# Patient Record
Sex: Female | Born: 1960
Health system: Southern US, Community
[De-identification: ages and names within clinical notes are randomized; demographics above are authoritative.]

## PROBLEM LIST (undated history)

## (undated) DIAGNOSIS — E785 Hyperlipidemia, unspecified: Secondary | ICD-10-CM

## (undated) DIAGNOSIS — K219 Gastro-esophageal reflux disease without esophagitis: Secondary | ICD-10-CM

## (undated) DIAGNOSIS — I82409 Acute embolism and thrombosis of unspecified deep veins of unspecified lower extremity: Secondary | ICD-10-CM

## (undated) DIAGNOSIS — J9611 Chronic respiratory failure with hypoxia: Secondary | ICD-10-CM

## (undated) DIAGNOSIS — I5032 Chronic diastolic (congestive) heart failure: Secondary | ICD-10-CM

## (undated) DIAGNOSIS — T4145XA Adverse effect of unspecified anesthetic, initial encounter: Secondary | ICD-10-CM

## (undated) DIAGNOSIS — J9612 Chronic respiratory failure with hypercapnia: Secondary | ICD-10-CM

## (undated) DIAGNOSIS — Z8616 Personal history of COVID-19: Secondary | ICD-10-CM

## (undated) DIAGNOSIS — I10 Essential (primary) hypertension: Secondary | ICD-10-CM

## (undated) DIAGNOSIS — T8859XA Other complications of anesthesia, initial encounter: Secondary | ICD-10-CM

## (undated) HISTORY — PX: BREAST EXCISIONAL BIOPSY: SUR124

## (undated) HISTORY — PX: ABDOMINAL HYSTERECTOMY: SHX81

## (undated) HISTORY — DX: Personal history of COVID-19: Z86.16

---

## 1999-04-08 ENCOUNTER — Encounter (HOSPITAL_BASED_OUTPATIENT_CLINIC_OR_DEPARTMENT_OTHER): Payer: Self-pay | Admitting: General Surgery

## 1999-04-08 ENCOUNTER — Ambulatory Visit (HOSPITAL_COMMUNITY): Admission: RE | Admit: 1999-04-08 | Discharge: 1999-04-08 | Payer: Self-pay | Admitting: General Surgery

## 1999-04-21 ENCOUNTER — Ambulatory Visit (HOSPITAL_COMMUNITY): Admission: RE | Admit: 1999-04-21 | Discharge: 1999-04-21 | Payer: Self-pay | Admitting: General Surgery

## 1999-04-21 ENCOUNTER — Encounter (HOSPITAL_BASED_OUTPATIENT_CLINIC_OR_DEPARTMENT_OTHER): Payer: Self-pay | Admitting: General Surgery

## 1999-04-29 ENCOUNTER — Emergency Department (HOSPITAL_COMMUNITY): Admission: EM | Admit: 1999-04-29 | Discharge: 1999-04-29 | Payer: Self-pay | Admitting: Emergency Medicine

## 1999-07-06 ENCOUNTER — Encounter (HOSPITAL_BASED_OUTPATIENT_CLINIC_OR_DEPARTMENT_OTHER): Payer: Self-pay | Admitting: General Surgery

## 1999-07-08 ENCOUNTER — Encounter (HOSPITAL_BASED_OUTPATIENT_CLINIC_OR_DEPARTMENT_OTHER): Payer: Self-pay | Admitting: General Surgery

## 1999-07-08 ENCOUNTER — Ambulatory Visit (HOSPITAL_COMMUNITY): Admission: RE | Admit: 1999-07-08 | Discharge: 1999-07-08 | Payer: Self-pay | Admitting: General Surgery

## 1999-07-11 ENCOUNTER — Encounter (HOSPITAL_BASED_OUTPATIENT_CLINIC_OR_DEPARTMENT_OTHER): Payer: Self-pay | Admitting: General Surgery

## 2000-04-12 ENCOUNTER — Other Ambulatory Visit: Admission: RE | Admit: 2000-04-12 | Discharge: 2000-04-12 | Payer: Self-pay | Admitting: Obstetrics and Gynecology

## 2000-04-23 ENCOUNTER — Encounter: Payer: Self-pay | Admitting: Obstetrics and Gynecology

## 2000-04-23 ENCOUNTER — Ambulatory Visit (HOSPITAL_COMMUNITY): Admission: RE | Admit: 2000-04-23 | Discharge: 2000-04-23 | Payer: Self-pay | Admitting: Obstetrics and Gynecology

## 2000-06-07 ENCOUNTER — Encounter: Payer: Self-pay | Admitting: Obstetrics and Gynecology

## 2000-06-13 ENCOUNTER — Encounter (INDEPENDENT_AMBULATORY_CARE_PROVIDER_SITE_OTHER): Payer: Self-pay | Admitting: Specialist

## 2000-06-13 ENCOUNTER — Inpatient Hospital Stay (HOSPITAL_COMMUNITY): Admission: RE | Admit: 2000-06-13 | Discharge: 2000-06-15 | Payer: Self-pay | Admitting: Obstetrics and Gynecology

## 2001-03-05 ENCOUNTER — Emergency Department (HOSPITAL_COMMUNITY): Admission: EM | Admit: 2001-03-05 | Discharge: 2001-03-05 | Payer: Self-pay

## 2002-07-17 ENCOUNTER — Encounter (HOSPITAL_BASED_OUTPATIENT_CLINIC_OR_DEPARTMENT_OTHER): Payer: Self-pay | Admitting: General Surgery

## 2002-07-17 ENCOUNTER — Ambulatory Visit (HOSPITAL_COMMUNITY): Admission: RE | Admit: 2002-07-17 | Discharge: 2002-07-17 | Payer: Self-pay | Admitting: General Surgery

## 2003-01-16 ENCOUNTER — Encounter: Payer: Self-pay | Admitting: Emergency Medicine

## 2003-01-16 ENCOUNTER — Emergency Department (HOSPITAL_COMMUNITY): Admission: EM | Admit: 2003-01-16 | Discharge: 2003-01-16 | Payer: Self-pay | Admitting: Emergency Medicine

## 2003-04-07 ENCOUNTER — Encounter: Payer: Self-pay | Admitting: Emergency Medicine

## 2003-04-07 ENCOUNTER — Emergency Department (HOSPITAL_COMMUNITY): Admission: AC | Admit: 2003-04-07 | Discharge: 2003-04-07 | Payer: Self-pay

## 2003-09-03 ENCOUNTER — Ambulatory Visit (HOSPITAL_COMMUNITY): Admission: RE | Admit: 2003-09-03 | Discharge: 2003-09-03 | Payer: Self-pay | Admitting: General Surgery

## 2003-10-19 ENCOUNTER — Emergency Department (HOSPITAL_COMMUNITY): Admission: EM | Admit: 2003-10-19 | Discharge: 2003-10-19 | Payer: Self-pay | Admitting: Emergency Medicine

## 2003-10-28 ENCOUNTER — Encounter: Admission: RE | Admit: 2003-10-28 | Discharge: 2003-10-28 | Payer: Self-pay | Admitting: Family Medicine

## 2006-06-10 ENCOUNTER — Emergency Department (HOSPITAL_COMMUNITY): Admission: EM | Admit: 2006-06-10 | Discharge: 2006-06-11 | Payer: Self-pay | Admitting: Emergency Medicine

## 2006-10-08 ENCOUNTER — Ambulatory Visit (HOSPITAL_COMMUNITY): Admission: RE | Admit: 2006-10-08 | Discharge: 2006-10-08 | Payer: Self-pay | Admitting: Family Medicine

## 2007-12-14 ENCOUNTER — Emergency Department (HOSPITAL_COMMUNITY): Admission: EM | Admit: 2007-12-14 | Discharge: 2007-12-14 | Payer: Self-pay | Admitting: Emergency Medicine

## 2009-01-21 ENCOUNTER — Ambulatory Visit (HOSPITAL_COMMUNITY): Admission: RE | Admit: 2009-01-21 | Discharge: 2009-01-21 | Payer: Self-pay | Admitting: Family Medicine

## 2010-05-25 ENCOUNTER — Emergency Department (HOSPITAL_COMMUNITY): Admission: EM | Admit: 2010-05-25 | Discharge: 2010-05-25 | Payer: Self-pay | Admitting: Emergency Medicine

## 2010-09-11 ENCOUNTER — Emergency Department (HOSPITAL_COMMUNITY): Admission: EM | Admit: 2010-09-11 | Discharge: 2010-09-11 | Payer: Self-pay | Admitting: Emergency Medicine

## 2010-11-06 ENCOUNTER — Encounter: Payer: Self-pay | Admitting: Family Medicine

## 2010-12-27 LAB — DIFFERENTIAL
Basophils Absolute: 0 10*3/uL (ref 0.0–0.1)
Basophils Relative: 0 % (ref 0–1)
Eosinophils Absolute: 0 10*3/uL (ref 0.0–0.7)
Eosinophils Relative: 0 % (ref 0–5)
Lymphocytes Relative: 25 % (ref 12–46)
Lymphs Abs: 2 10*3/uL (ref 0.7–4.0)
Monocytes Absolute: 0.6 10*3/uL (ref 0.1–1.0)
Monocytes Relative: 8 % (ref 3–12)
Neutro Abs: 5.3 10*3/uL (ref 1.7–7.7)
Neutrophils Relative %: 67 % (ref 43–77)

## 2010-12-27 LAB — CBC
HCT: 40.3 % (ref 36.0–46.0)
Hemoglobin: 12.9 g/dL (ref 12.0–15.0)
MCH: 23.9 pg — ABNORMAL LOW (ref 26.0–34.0)
MCHC: 32 g/dL (ref 30.0–36.0)
MCV: 74.6 fL — ABNORMAL LOW (ref 78.0–100.0)
Platelets: 289 10*3/uL (ref 150–400)
RBC: 5.4 MIL/uL — ABNORMAL HIGH (ref 3.87–5.11)
RDW: 15.7 % — ABNORMAL HIGH (ref 11.5–15.5)
WBC: 8 10*3/uL (ref 4.0–10.5)

## 2010-12-27 LAB — SEDIMENTATION RATE: Sed Rate: 47 mm/hr — ABNORMAL HIGH (ref 0–22)

## 2011-03-03 NOTE — H&P (Signed)
[facility]  Patient:    Tamara Russell, Tamara Russell                      MRN: [id number] Adm. Date:  06/13/00 Attending:  Zenaida Niece, M.D.                         History and Physical  CHIEF COMPLAINT:  Symptomatic leiomyomatous uterus.  HISTORY OF PRESENT ILLNESS:  This is a 50 year old black female gravida 3, para 3-0-0-3 whom I saw for her annual exam in June of this year. At that time, she complained of regular menses which were fairly heavy with significant cramping. She was having no intermenstrual bleeding. She also complained of some burning after intercourse and some pelvic pressure, as well as occasional constipation. Physical exam at that time revealed her uterus to be 16 to 18 weeks size and mobile and nontender. She also had bacterial vaginosis. She was treated for the bacterial vaginosis, and a pelvic ultrasound confirms that she has multiple fibroids, some with cystic degeneration. Due to the size of this uterus, surgical therapy was recommended. She is admitted for hysterectomy at this time.  PAST OBSTETRICAL HISTORY:  Significant for two vaginal deliveries at term without complications and one cesarean section at term for a 10 pound, 7 1/2 ounce baby.  PAST SURGICAL HISTORY:  The remainder of her surgical history is significant for tubal ligation, as well as two breast biopsies.  PAST MEDICAL HISTORY:  Significant for hypertension.  ALLERGIES:  None known.  CURRENT MEDICATIONS:  She is on a blood pressure medication which she cannot quote to me in the office.  SOCIAL HISTORY:  The patient is married and denies alcohol, tobacco, or drug use.  FAMILY HISTORY:  Significant for a maternal grandmother with colon cancer.  REVIEW OF SYSTEMS:  Otherwise negative.  PHYSICAL EXAMINATION:  GENERAL:  She is an obese, black female who is in no acute distress.  VITAL SIGNS:  Weight 226 pounds, vitals are stable. Office hemoglobin  11.9.  HEENT:  Pupils are equally round and reactive to light and accommodation. Extraocular muscles are intact. Oropharynx is clear without erythema or exudates.  NECK:  Supple without lymphadenopathy or thyromegaly.  LUNGS:  Clear to auscultation.  HEART:  Regular rate and rhythm without murmurs.  BREASTS:  Exam in the sitting and supine position revealed no dominant masses, adenopathy, skin change, or nipple discharge.  ABDOMEN:  Soft, nontender, nondistended with a pelvic mass at her umbilicus and she has a well-healed transverse scar.  EXTREMITIES:  No edema. Nontender. DTRs are 2/4 and symmetric.  PELVIC:  External genitalia is without lesions. On speculum exam, she has a normal-appearing cervix and Pap smears within normal limits. On bimanual exam, she has now an 18- to 20-week size irregular uterus which is mobile and nontender.  ASSESSMENT:  Symptomatic leiomyomatous uterus with menorrhagia and dysmenorrhea. The uterus is also now approximately twice as big as it was two years ago. She also has a well-controlled hypertension. Options for therapy were discussed with the patient, including medical therapy, to await menopause, and surgical therapy including myomectomy and hysterectomy. The patient agrees to proceed with hysterectomy. We discussed ovarian conversation, and she desires ovarian removal. Risks of surgery including bleeding, infection, damage to surrounding organs have been discussed with the patient, and she agrees to proceed.  PLAN:  Admit the patient on the day of surgery for an abdominal hysterectomy with bilateral  salpingo-oophorectomy.DD:  06/12/00 TD:  06/12/00 Job: [id number]

## 2011-03-03 NOTE — Discharge Summary (Signed)
[facility]  Patient:    Tamara Russell, Tamara Russell                  MRN: [id number] Adm. Date:  [id number] Disc. Date: [id number] Attending:  Michaele Offer                           Discharge Summary  ADMISSION DIAGNOSIS:   Symptomatic leiomyomatous uterus.  DISCHARGE DIAGNOSIS:   Symptomatic leiomyomatous uterus.  PROCEDURES:  Total abdominal hysterectomy and bilateral salpingo-oophorectomy.  COMPLICATIONS:  None.  CONSULTATIONS:  None.  HISTORY & PHYSICAL:  Briefly, this is a 50 year old black female, gravida 3, para 3-0-0-3, who has regular menses which are fairly heavy with significant cramping.  She also has some dyspareunia and pelvic pressure as well as occasional constipation.  Physical exam revealed her uterus to be 16 to 18-week size consistent with le;iomyomatous uterus.  This is confirmed by pelvic ultrasound which reveals fibroids with cystic degeneration.  She was offered hysterectomy due to the size of the uterus and her symptoms and is admitted for this at this time.  PAST MEDICAL HISTORY:  Significant for two vaginal deliveries, one cesarean section at term, a tubal ligation, and two breast biopsies.  She also has hypertension which is controlled on Hyzaar and Norvasc.  CURRENT MEDICATIONS: 1. Hyzaar 100/25 one p.o. q.h.s. 2. Norvasc 10 mg p.o. q.h.s.  The remainder of her history is noncontributory.  PHYSICAL EXAMINATION:  VITAL SIGNS: Weight 226 pounds.  ABDOMEN:  Soft, nontender, nondistended, with a pelvic mass at her umbilicus and a well-healed previous transverse scar.  PELVIC:  Normal external genitalia and a normal-appearing cervix on pelvic exam.  On bimanual exam, she has an 18 to 20-week size irregular uterus which is mobile and nontender.  HOSPITAL COURSE:  The patient was admitted on the day of surgery and underwent a TAH-BSO under general anesthesia with an estimated blood loss of 150 cc. She had an  enlarged irregular uterus with multiple leiomyomas and normal ovaries and appendix.  Postoperatively, she had a low-grade temperature to 100.4, and preoperative hemoglobin was 12.4; postoperative hemoglobin was 9.3.  She was rapidly able to ambulate and tolerate a regular diet.  On the evening of postoperative day #2, her incision was well approximated. She was tolerating a diet, remained afebrile, and was felt to be stable enou9gh for discharge home.  CONDITION ON DISCHARGE:  Stable.  DISPOSITION:   Discharged to home.  DISCHARGE INSTRUCTIONS:  Her diet is regular diet.  Her activity is pelvic rest, no strenuous activity, and no driving.  Her followup is in three to four days for staple removal.  Medications are Percocet p.r.n. pain and Estradiol 1 mg p.o. q.d. DD:  06/23/00 TD:  06/25/00 Job: [id number]

## 2011-03-03 NOTE — Op Note (Signed)
[facility]  Patient:    Tamara Russell, Tamara Russell                  MRN: [id number] Proc. Date: 06/13/00 Adm. Date:  [id number] Attending:  Michaele Offer                           Operative Report  PREOPERATIVE DIAGNOSES:  Symptomatic leiomyomata uterus.  POSTOPERATIVE DIAGNOSES:  Symptomatic leiomyomata uterus.  PROCEDURE:  Total abdominal hysterectomy, bilateral salpingo-oophorectomy.  SURGEON:  Lavina Hamman, M.D.  ASSISTANT:  Huel Cote, M.D.  ANESTHESIA:  General endotracheal tube.  ESTIMATED BLOOD LOSS:  150 cc.  CHEMOPROPHYLAXIS:  Ancef 1 gm.  FINDINGS:  Enlarged irregular uterus with multiple leiomyomas with normal tubes and ovaries with evidence of previous tubal ligation and a normal appendix.  COUNTS:  Correct.  CONDITION:  Stable.  DESCRIPTION OF PROCEDURE:  After appropriate informed consent was obtained, the patient was taken to the operating room and placed in the dorsal supine position. General anesthesia was induced and her abdomen was prepped and draped in the usual sterile fashion and a Foley catheter inserted. Her abdomen was then entered via her previous Pfannenstiel incision. A self retaining retractor was placed including a bladder blade. The uterus was then delivered through the incision and found to be enlarged irregular with several leiomyomas. The round ligaments were divided with electrocautery and the bladder flap created anteriorly and the bladder pushed inferiorly.  Both infundibulopelvic ligaments were isolated, clamped, transected and ligated with #1 chromic doubly to achieve adequate hemostasis. Uterine arteries were skeletonized, clamped, transected and ligated with #1 chromic. The bladder was then pushed well inferior. The cardinal ligaments were clamped, transected and ligated with #1 chromic. The uterosacral ligaments were clamped, transected, and ligated with #1 chromic. On the patients right  side this included the vaginal angle.  Once the vagina was entered on the right side, a clamp was placed on the vaginal angle on the left and the uterus with the cervix was removed. The left vaginal angle was then secured with #1 chromic. The remainder of the vagina was able to be closed with 1 figure-of-eight suture of #1 chromic. The pelvis was irrigated and bleeding from sural sludge is controlled with electrocautery. Both ureters were identified and found to be out of the operative field. The uterosacral ligaments were plicated in the midline with 1 interrupted suture of #1 chromic. The pelvis was again irrigated and found to be hemostatic. All packs were removed from the abdomen as well as the self retaining retractor. The subfascial space was irrigated and made hemostatic with electrocautery. The fascia was closed in a running fashion starting at both ends and meeting in the middle with #0 Vicryl. The fascia on her left side was rather unusual in that it appeared that some of the rectus abdominis had grown between the layers of fascia. The subfascial space was then irrigated and made hemostatic with electrocautery. The subcutaneous tissue was closed with a running suture of 3-0 Vicryl. The skin was then closed with staples and a sterile dressing. The patient tolerated the procedure well and was extubated in the operating room and taken to the recovery room in stable condition. DD:  06/13/00 TD:  06/13/00 Job: [id number]

## 2013-07-27 ENCOUNTER — Emergency Department (HOSPITAL_COMMUNITY): Payer: BC Managed Care – PPO

## 2013-07-27 ENCOUNTER — Emergency Department (HOSPITAL_COMMUNITY)
Admission: EM | Admit: 2013-07-27 | Discharge: 2013-07-27 | Disposition: A | Discharge status: Home / Self Care | Payer: BC Managed Care – PPO | Attending: Emergency Medicine | Admitting: Emergency Medicine

## 2013-07-27 ENCOUNTER — Encounter (HOSPITAL_COMMUNITY): Payer: Self-pay | Admitting: Emergency Medicine

## 2013-07-27 DIAGNOSIS — Z880 Allergy status to penicillin: Secondary | ICD-10-CM | POA: Insufficient documentation

## 2013-07-27 DIAGNOSIS — K219 Gastro-esophageal reflux disease without esophagitis: Secondary | ICD-10-CM

## 2013-07-27 DIAGNOSIS — I10 Essential (primary) hypertension: Secondary | ICD-10-CM | POA: Insufficient documentation

## 2013-07-27 HISTORY — DX: Essential (primary) hypertension: I10

## 2013-07-27 LAB — POCT I-STAT TROPONIN I

## 2013-07-27 LAB — POCT I-STAT, CHEM 8
Calcium, Ion: 1.43 mmol/L — ABNORMAL HIGH (ref 1.12–1.23)
Chloride: 96 mEq/L (ref 96–112)
Creatinine, Ser: 1 mg/dL (ref 0.50–1.10)
Glucose, Bld: 93 mg/dL (ref 70–99)
HCT: 42 % (ref 36.0–46.0)
Hemoglobin: 14.3 g/dL (ref 12.0–15.0)
TCO2: 34 mmol/L (ref 0–100)

## 2013-07-27 LAB — CBC
HCT: 40.1 % (ref 36.0–46.0)
MCH: 24.8 pg — ABNORMAL LOW (ref 26.0–34.0)
MCHC: 33.9 g/dL (ref 30.0–36.0)
MCV: 73.2 fL — ABNORMAL LOW (ref 78.0–100.0)
RDW: 15.3 % (ref 11.5–15.5)

## 2013-07-27 MED ORDER — RANITIDINE HCL 150 MG PO CAPS: 150.0000 mg | ORAL_CAPSULE | Freq: Two times a day (BID) | ORAL | Status: DC

## 2013-07-27 MED ORDER — PANTOPRAZOLE SODIUM 40 MG IV SOLR
40.0000 mg | Freq: Once | INTRAVENOUS | Status: DC
  Filled 2013-07-27: qty 40

## 2013-07-27 MED ORDER — FAMOTIDINE 20 MG PO TABS: 40.0000 mg | ORAL_TABLET | Freq: Once | ORAL | Status: DC

## 2013-07-27 MED ORDER — GI COCKTAIL ~~LOC~~
30.0000 mL | Freq: Once | ORAL | Status: AC
  Administered 2013-07-27: 30 mL via ORAL
  Filled 2013-07-27: qty 30

## 2013-07-27 MED ORDER — PANTOPRAZOLE SODIUM 40 MG IV SOLR
40.0000 mg | Freq: Once | INTRAVENOUS | Status: AC
  Administered 2013-07-27: 40 mg via INTRAVENOUS

## 2013-07-27 NOTE — ED Notes (Signed)
Per pt sts for 1 week she has had acid reflux. sts burning in her stomach and nausea. Pt sts Tums relieve it for a little bit but then it comes back. sts she use to take a blue pill for it. sts drinking cold milk also helps. Denies blood in stool or emesis.

## 2013-07-27 NOTE — ED Provider Notes (Signed)
Medical screening examination/treatment/procedure(s) were performed by non-physician practitioner and as supervising physician I was immediately available for consultation/collaboration.  Layla Maw Tobi Groesbeck, DO 07/27/13 2006

## 2013-07-27 NOTE — ED Notes (Signed)
Pt reports GI cocktail has decreased her sx some, but they have not completely resolved.

## 2013-07-27 NOTE — ED Notes (Signed)
Pt c/o burning sensation in her stomach x 1 wk. Pt states tums and cold milk help relieve some of her sx. Pt states she has hx of GERD but she has not been taking any medications for it for a while due to not having any issues with Acid Reflux. Pt denies any pain at this time.

## 2013-07-27 NOTE — ED Provider Notes (Signed)
CSN: [id number]     Arrival date & time 07/27/13  1050 History   First MD Initiated Contact with Patient 07/27/13 1116     Chief Complaint  Patient presents with  . Gastrophageal Reflux   (Consider location/radiation/quality/duration/timing/severity/associated sxs/prior Treatment) HPI Comments: Pt has a history of gerd and has been off medication for a long time. in the last week the symptoms started again with a burning in the epigastric area:no vomiting:no cp or sob  Patient is a 52 y.o. female presenting with GERD. The history is provided by the patient. No language interpreter was used.  Gastrophageal Reflux This is a recurrent problem. The current episode started in the past 7 days. The problem occurs constantly. The problem has been unchanged. Pertinent negatives include no chest pain, coughing, diaphoresis, fever, numbness, vomiting or weakness. Treatments tried: tums, milk. The treatment provided significant relief.    Past Medical History  Diagnosis Date  . Hypertension    Past Surgical History  Procedure Laterality Date  . Abdominal hysterectomy     History reviewed. No pertinent family history. History  Substance Use Topics  . Smoking status: Never Smoker   . Smokeless tobacco: Not on file  . Alcohol Use: No   OB History   Grav Para Term Preterm Abortions TAB SAB Ect Mult Living                 Review of Systems  Constitutional: Negative for fever and diaphoresis.  Respiratory: Negative for cough.   Cardiovascular: Negative for chest pain.  Gastrointestinal: Negative for vomiting.  Neurological: Negative for weakness and numbness.    Allergies  Penicillins  Home Medications  No current outpatient prescriptions on file. There were no vitals taken for this visit. Physical Exam  Nursing note and vitals reviewed. Constitutional: She is oriented to person, place, and time. She appears well-developed and well-nourished.  HENT:  Head: Normocephalic and  atraumatic.  Eyes: Conjunctivae and EOM are normal.  Neck: Neck supple.  Cardiovascular: Normal rate and regular rhythm.   Pulmonary/Chest: Effort normal and breath sounds normal.  Abdominal: Soft. Bowel sounds are normal. There is no tenderness.  Musculoskeletal: Normal range of motion.  Neurological: She is alert and oriented to person, place, and time.  Skin: Skin is warm and dry.  Psychiatric: She has a normal mood and affect.    ED Course  Procedures (including critical care time) Labs Review Labs Reviewed  CBC - Abnormal; Notable for the following:    RBC 5.48 (*)    MCV 73.2 (*)    MCH 24.8 (*)    All other components within normal limits  POCT I-STAT, CHEM 8 - Abnormal; Notable for the following:    Calcium, Ion 1.43 (*)    All other components within normal limits  POCT I-STAT TROPONIN I   Imaging Review Dg Chest 2 View  07/27/2013   CLINICAL DATA:  Epigastric pain.  EXAM: CHEST  2 VIEW  COMPARISON:  05/25/2010  FINDINGS: Upper limits normal heart size noted.  Mild peribronchial thickening again noted.  There is no evidence of focal airspace disease, pulmonary edema, suspicious pulmonary nodule/mass, pleural effusion, or pneumothorax.  No acute bony abnormalities are identified.  IMPRESSION: No evidence of acute cardiopulmonary disease.   Electronically Signed   By: Laveda Abbe M.D.   On: 07/27/2013 11:57     Date: 07/27/2013  Rate: 75  Rhythm: normal sinus rhythm  QRS Axis: normal  Intervals: normal  ST/T Wave abnormalities: normal  Conduction Disutrbances:none  Narrative Interpretation:   Old EKG Reviewed: none available   EKG Interpretation   None       MDM   1. GERD (gastroesophageal reflux disease)     1:23 PM Pt states that the symptoms resolved with the gi cocktail but they are returning again:will send pt home with treatment for gerd:doubt acs    Teressa Lower, NP 07/27/13 1408

## 2013-07-27 NOTE — ED Notes (Signed)
Discharge instructions reviewed. Pt verbalized understanding.  

## 2013-08-08 ENCOUNTER — Encounter (HOSPITAL_COMMUNITY): Payer: Self-pay | Admitting: Emergency Medicine

## 2013-08-08 ENCOUNTER — Emergency Department (HOSPITAL_COMMUNITY): Payer: BC Managed Care – PPO

## 2013-08-08 ENCOUNTER — Emergency Department (HOSPITAL_COMMUNITY)
Admission: EM | Admit: 2013-08-08 | Discharge: 2013-08-08 | Disposition: A | Discharge status: Home / Self Care | Payer: BC Managed Care – PPO | Attending: Emergency Medicine | Admitting: Emergency Medicine

## 2013-08-08 DIAGNOSIS — IMO0002 Reserved for concepts with insufficient information to code with codable children: Secondary | ICD-10-CM | POA: Insufficient documentation

## 2013-08-08 DIAGNOSIS — S79919A Unspecified injury of unspecified hip, initial encounter: Secondary | ICD-10-CM | POA: Insufficient documentation

## 2013-08-08 DIAGNOSIS — Y9389 Activity, other specified: Secondary | ICD-10-CM | POA: Insufficient documentation

## 2013-08-08 DIAGNOSIS — Y92410 Unspecified street and highway as the place of occurrence of the external cause: Secondary | ICD-10-CM | POA: Insufficient documentation

## 2013-08-08 DIAGNOSIS — Z79899 Other long term (current) drug therapy: Secondary | ICD-10-CM | POA: Insufficient documentation

## 2013-08-08 DIAGNOSIS — Z880 Allergy status to penicillin: Secondary | ICD-10-CM | POA: Insufficient documentation

## 2013-08-08 HISTORY — DX: Gastro-esophageal reflux disease without esophagitis: K21.9

## 2013-08-08 MED ORDER — CYCLOBENZAPRINE HCL 10 MG PO TABS: 10.0000 mg | ORAL_TABLET | Freq: Two times a day (BID) | ORAL | Status: DC | PRN
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
## 2013-08-09 NOTE — ED Provider Notes (Signed)
Medical screening examination/treatment/procedure(s) were performed by non-physician practitioner and as supervising physician I was immediately available for consultation/collaboration.  EKG Interpretation   None         Balraj Brayfield M Danicia Terhaar, MD 08/09/13 0011 

## 2014-05-27 ENCOUNTER — Encounter (HOSPITAL_COMMUNITY): Payer: Self-pay

## 2014-07-16 LAB — HM MAMMOGRAPHY

## 2015-01-15 ENCOUNTER — Ambulatory Visit: Payer: Self-pay | Attending: Internal Medicine

## 2015-03-23 ENCOUNTER — Emergency Department (HOSPITAL_COMMUNITY)
Admission: EM | Admit: 2015-03-23 | Discharge: 2015-03-23 | Disposition: A | Discharge status: Home / Self Care | Payer: Self-pay | Attending: Emergency Medicine | Admitting: Emergency Medicine

## 2015-03-23 ENCOUNTER — Encounter (HOSPITAL_COMMUNITY): Payer: Self-pay | Admitting: Physical Medicine and Rehabilitation

## 2015-03-23 DIAGNOSIS — R600 Localized edema: Secondary | ICD-10-CM | POA: Insufficient documentation

## 2015-03-23 DIAGNOSIS — I10 Essential (primary) hypertension: Secondary | ICD-10-CM | POA: Insufficient documentation

## 2015-03-23 DIAGNOSIS — Z760 Encounter for issue of repeat prescription: Secondary | ICD-10-CM | POA: Insufficient documentation

## 2015-03-23 DIAGNOSIS — Z880 Allergy status to penicillin: Secondary | ICD-10-CM | POA: Insufficient documentation

## 2015-03-23 DIAGNOSIS — Z79899 Other long term (current) drug therapy: Secondary | ICD-10-CM | POA: Insufficient documentation

## 2015-03-23 DIAGNOSIS — K219 Gastro-esophageal reflux disease without esophagitis: Secondary | ICD-10-CM | POA: Insufficient documentation

## 2015-03-23 MED ORDER — LOSARTAN POTASSIUM 50 MG PO TABS
100.0000 mg | ORAL_TABLET | Freq: Once | ORAL | Status: AC
  Administered 2015-03-23: 100 mg via ORAL
  Filled 2015-03-23: qty 2

## 2015-03-23 MED ORDER — LOSARTAN POTASSIUM-HCTZ 100-25 MG PO TABS: 1.0000 | ORAL_TABLET | Freq: Every day | ORAL | Status: DC

## 2015-03-23 MED ORDER — HYDROCHLOROTHIAZIDE 25 MG PO TABS
25.0000 mg | ORAL_TABLET | Freq: Once | ORAL | Status: AC
  Administered 2015-03-23: 25 mg via ORAL
  Filled 2015-03-23: qty 1

## 2015-03-23 MED ORDER — AMLODIPINE BESYLATE 5 MG PO TABS
5.0000 mg | ORAL_TABLET | Freq: Once | ORAL | Status: AC
  Administered 2015-03-23: 5 mg via ORAL
  Filled 2015-03-23: qty 1

## 2015-03-23 MED ORDER — AMLODIPINE BESYLATE 5 MG PO TABS: 5.0000 mg | ORAL_TABLET | Freq: Every day | ORAL | Status: DC

## 2015-03-23 NOTE — ED Notes (Signed)
Pt ran out of her BP medication last night and has an appt on the 16th to F/u with [facility] and they told her to come here and get a Rx for enough medication to get her through till then.

## 2015-03-23 NOTE — ED Notes (Signed)
Onalee Huaavid, NP at the bedside.

## 2015-03-23 NOTE — Discharge Instructions (Signed)
Medication Refill, Emergency Department °We have refilled your medication today as a courtesy to you. It is best for your medical care, however, to take care of getting refills done through your primary caregiver's office. They have your records and can do a better job of follow-up than we can in the emergency department. °On maintenance medications, we often only prescribe enough medications to get you by until you are able to see your regular caregiver. This is a more expensive way to refill medications. °In the future, please plan for refills so that you will not have to use the emergency department for this. °Thank you for your help. Your help allows us to better take care of the daily emergencies that enter our department. °Document Released: 01/19/2004 Document Revised: 12/25/2011 Document Reviewed: 01/09/2014 °[employer]® Patient Information ©2015 [employer]. This information is not intended to replace advice given to you by your health care provider. Make sure you discuss any questions you have with your health care provider. ° °

## 2015-03-23 NOTE — ED Provider Notes (Signed)
CSN: [id number]     Arrival date & time 03/23/15  1710 History  This chart was scribed for non-physician practitioner, Felicie Morn, NP working with Derwood Kaplan, MD by Gwenyth Ober, ED scribe. This patient was seen in room TR09C/TR09C and the patient's care was started at 5:56 PM   Chief Complaint  Patient presents with  . Medication Refill   The history is provided by the patient. No language interpreter was used.   HPI Comments: Tamara Russell is a 54 y.o. female who presents to the Emergency Department for a refill of her blood pressure medication. Pt reports that she ran out of Amlodipine and Losartan-HCTZ last night. She [facility] and was referred to the ED for a refill of her medication prior to her appointment. Pt reports a history of HA with noncompliance. She also notes that she does not currently have money for treatment. Pt denies current HA, CP and SOB as associated symptoms.  Past Medical History  Diagnosis Date  . Hypertension   . Acid reflux    Past Surgical History  Procedure Laterality Date  . Abdominal hysterectomy     No family history on file. History  Substance Use Topics  . Smoking status: Never Smoker   . Smokeless tobacco: Not on file  . Alcohol Use: No   OB History    No data available     Review of Systems  Respiratory: Negative for shortness of breath.   Cardiovascular: Negative for chest pain.  Neurological: Negative for headaches.  All other systems reviewed and are negative.     Allergies  Penicillins  Home Medications   Prior to Admission medications   Medication Sig Start Date End Date Taking? Authorizing Provider  amLODipine (NORVASC) 5 MG tablet Take 5 mg by mouth daily.  07/21/13   Historical Provider, MD  calcium carbonate (TUMS - DOSED IN MG ELEMENTAL CALCIUM) 500 MG chewable tablet Chew 2 tablets by mouth daily.    Historical Provider, MD  cholecalciferol (VITAMIN D) 1000 UNITS  tablet Take 1,000 Units by mouth daily.    Historical Provider, MD  cyclobenzaprine (FLEXERIL) 10 MG tablet Take 1 tablet (10 mg total) by mouth 2 (two) times daily as needed for muscle spasms. 08/08/13   Tiffany Neva Seat, PA-C  losartan-hydrochlorothiazide (HYZAAR) 100-25 MG per tablet Take 1 tablet by mouth daily.  07/23/13   Historical Provider, MD  Multiple Vitamins-Minerals (CENTRUM SILVER ADULT 50+ PO) Take 1 tablet by mouth daily.    Historical Provider, MD  ranitidine (ZANTAC) 150 MG capsule Take 1 capsule (150 mg total) by mouth 2 (two) times daily. 07/27/13   Teressa Lower, NP   BP 155/90 mmHg  Pulse 87  Temp(Src) 98.5 F (36.9 C) (Oral)  Resp 18  SpO2 99% Physical Exam  Constitutional: She appears well-developed and well-nourished. No distress.  HENT:  Head: Normocephalic and atraumatic.  Eyes: Conjunctivae and EOM are normal.  Neck: Neck supple. No tracheal deviation present.  Cardiovascular: Normal rate, regular rhythm and normal heart sounds.   Pulmonary/Chest: Effort normal and breath sounds normal. No respiratory distress.  Musculoskeletal: She exhibits edema.  Trace edema to ankles  Skin: Skin is warm and dry.  Psychiatric: She has a normal mood and affect. Her behavior is normal.  Nursing note and vitals reviewed.   ED Course  Procedures   DIAGNOSTIC STUDIES: Oxygen Saturation is 99% on RA, normal by my interpretation.    COORDINATION OF CARE:  5:58 PM Discussed treatment plan with pt which includes prescription for her medications and consultation with social worker. Pt agreed to plan.   Labs Review Labs Reviewed - No data to display  Imaging Review No results found.   EKG Interpretation None      MDM   Final diagnoses:  None    Medication refill.  Take prescriptions to pharmacy at [facility] tomorrow. Tonight's dose provided in ED.  [facility]  I personally performed the  services described in this documentation, which was scribed in my presence. The recorded information has been reviewed and is accurate.    Felicie Mornavid Chantay Whitelock, NP 03/23/15 2159  Derwood KaplanAnkit Nanavati, MD 03/25/15 [phone]

## 2015-03-23 NOTE — Care Management (Signed)
Patient presents to [facility] ED for medication refill, she is noted to [facility] Patient made aware that [facility], as early as tomorrow to fill her prescription. No further ED CM needs identified.

## 2015-03-23 NOTE — ED Notes (Signed)
Pt requesting refill on BP medications. Ran out of Amlodipine and Losartan-HCTZ. Pt is alert and oriented x4. NAD

## 2015-04-01 ENCOUNTER — Ambulatory Visit: Payer: No Typology Code available for payment source | Attending: Internal Medicine | Admitting: Internal Medicine

## 2015-04-01 ENCOUNTER — Encounter: Payer: Self-pay | Admitting: Internal Medicine

## 2015-04-01 VITALS — BP 145/90 | HR 95 | Wt 230.0 lb

## 2015-04-01 DIAGNOSIS — Z23 Encounter for immunization: Secondary | ICD-10-CM | POA: Diagnosis not present

## 2015-04-01 DIAGNOSIS — I10 Essential (primary) hypertension: Secondary | ICD-10-CM | POA: Diagnosis not present

## 2015-04-01 LAB — COMPLETE METABOLIC PANEL WITH GFR
ALBUMIN: 3.6 g/dL (ref 3.5–5.2)
ALT: 18 U/L (ref 0–35)
AST: 22 U/L (ref 0–37)
Alkaline Phosphatase: 70 U/L (ref 39–117)
BUN: 9 mg/dL (ref 6–23)
CHLORIDE: 97 meq/L (ref 96–112)
CO2: 38 mEq/L — ABNORMAL HIGH (ref 19–32)
Calcium: 9.2 mg/dL (ref 8.4–10.5)
Creat: 0.69 mg/dL (ref 0.50–1.10)
GFR, Est African American: >89 mL/min
GLUCOSE: 115 mg/dL — AB (ref 70–99)
Potassium: 3.3 mEq/L — ABNORMAL LOW (ref 3.5–5.3)
Sodium: 143 mEq/L (ref 135–145)
Total Bilirubin: 0.4 mg/dL (ref 0.2–1.2)
Total Protein: 6.5 g/dL (ref 6.0–8.3)

## 2015-04-01 MED ORDER — AMLODIPINE BESYLATE 5 MG PO TABS: 5.0000 mg | ORAL_TABLET | Freq: Every day | ORAL | Status: DC

## 2015-04-01 MED ORDER — LOSARTAN POTASSIUM-HCTZ 100-25 MG PO TABS: 1.0000 | ORAL_TABLET | Freq: Every day | ORAL | Status: DC

## 2015-04-01 NOTE — Patient Instructions (Signed)
DASH Eating Plan °DASH stands for "Dietary Approaches to Stop Hypertension." The DASH eating plan is a healthy eating plan that has been shown to reduce high blood pressure (hypertension). Additional health benefits may include reducing the risk of type 2 diabetes mellitus, heart disease, and stroke. The DASH eating plan may also help with weight loss. °WHAT DO I NEED TO KNOW ABOUT THE DASH EATING PLAN? °For the DASH eating plan, you will follow these general guidelines: °· Choose foods with a percent daily value for sodium of less than 5% (as listed on the food label). °· Use salt-free seasonings or herbs instead of table salt or sea salt. °· Check with your health care provider or pharmacist before using salt substitutes. °· Eat lower-sodium products, often labeled as "lower sodium" or "no salt added." °· Eat fresh foods. °· Eat more vegetables, fruits, and low-fat dairy products. °· Choose whole grains. Look for the word "whole" as the first word in the ingredient list. °· Choose fish and skinless chicken or turkey more often than red meat. Limit fish, poultry, and meat to 6 oz (170 g) each day. °· Limit sweets, desserts, sugars, and sugary drinks. °· Choose heart-healthy fats. °· Limit cheese to 1 oz (28 g) per day. °· Eat more home-cooked food and less restaurant, buffet, and fast food. °· Limit fried foods. °· Cook foods using methods other than frying. °· Limit canned vegetables. If you do use them, rinse them well to decrease the sodium. °· When eating at a restaurant, ask that your food be prepared with less salt, or no salt if possible. °WHAT FOODS CAN I EAT? °Seek help from a dietitian for individual calorie needs. °Grains °Whole grain or whole wheat bread. Brown rice. Whole grain or whole wheat pasta. Quinoa, bulgur, and whole grain cereals. Low-sodium cereals. Corn or whole wheat flour tortillas. Whole grain cornbread. Whole grain crackers. Low-sodium crackers. °Vegetables °Fresh or frozen vegetables  (raw, steamed, roasted, or grilled). Low-sodium or reduced-sodium tomato and vegetable juices. Low-sodium or reduced-sodium tomato sauce and paste. Low-sodium or reduced-sodium canned vegetables.  °Fruits °All fresh, canned (in natural juice), or frozen fruits. °Meat and Other Protein Products °Ground beef (85% or leaner), grass-fed beef, or beef trimmed of fat. Skinless chicken or turkey. Ground chicken or turkey. Pork trimmed of fat. All fish and seafood. Eggs. Dried beans, peas, or lentils. Unsalted nuts and seeds. Unsalted canned beans. °Dairy °Low-fat dairy products, such as skim or 1% milk, 2% or reduced-fat cheeses, low-fat ricotta or cottage cheese, or plain low-fat yogurt. Low-sodium or reduced-sodium cheeses. °Fats and Oils °Tub margarines without trans fats. Light or reduced-fat mayonnaise and salad dressings (reduced sodium). Avocado. Safflower, olive, or canola oils. Natural peanut or almond butter. °Other °Unsalted popcorn and pretzels. °The items listed above may not be a complete list of recommended foods or beverages. Contact your dietitian for more options. °WHAT FOODS ARE NOT RECOMMENDED? °Grains °White bread. White pasta. White rice. Refined cornbread. Bagels and croissants. Crackers that contain trans fat. °Vegetables °Creamed or fried vegetables. Vegetables in a cheese sauce. Regular canned vegetables. Regular canned tomato sauce and paste. Regular tomato and vegetable juices. °Fruits °Dried fruits. Canned fruit in light or heavy syrup. Fruit juice. °Meat and Other Protein Products °Fatty cuts of meat. Ribs, chicken wings, bacon, sausage, bologna, salami, chitterlings, fatback, hot dogs, bratwurst, and packaged luncheon meats. Salted nuts and seeds. Canned beans with salt. °Dairy °Whole or 2% milk, cream, half-and-half, and cream cheese. Whole-fat or sweetened yogurt. Full-fat   cheeses or blue cheese. Nondairy creamers and whipped toppings. Processed cheese, cheese spreads, or cheese  curds. °Condiments °Onion and garlic salt, seasoned salt, table salt, and sea salt. Canned and packaged gravies. Worcestershire sauce. Tartar sauce. Barbecue sauce. Teriyaki sauce. Soy sauce, including reduced sodium. Steak sauce. Fish sauce. Oyster sauce. Cocktail sauce. Horseradish. Ketchup and mustard. Meat flavorings and tenderizers. Bouillon cubes. Hot sauce. Tabasco sauce. Marinades. Taco seasonings. Relishes. °Fats and Oils °Butter, stick margarine, lard, shortening, ghee, and bacon fat. Coconut, palm kernel, or palm oils. Regular salad dressings. °Other °Pickles and olives. Salted popcorn and pretzels. °The items listed above may not be a complete list of foods and beverages to avoid. Contact your dietitian for more information. °WHERE CAN I FIND MORE INFORMATION? °National Heart, [employer], and [employer] Institute: www.nhlbi.nih.gov/health/health-topics/topics/dash/ °Document Released: 09/21/2011 Document Revised: 02/16/2014 Document Reviewed: 08/06/2013 °ExitCare® Patient Information ©2015 [employer]. This information is not intended to replace advice given to you by your health care provider. Make sure you discuss any questions you have with your health care provider. ° °

## 2015-04-01 NOTE — Progress Notes (Signed)
  New patient here to established care and discuss her Hypertension.

## 2015-04-01 NOTE — Progress Notes (Signed)
Patient ID: Tamara Russell, female   DOB: 1961-08-02, 54 y.o.   MRN: [id number]  WYO:[id number]  [id number]:[id number]  DOB - 10-13-1961  CC:  Chief Complaint  Patient presents with  . New patient    Hypertension        HPI: Tamara Russell is a 54 y.o. female here today to establish medical care. Patient has a past medical history of HTN and acid reflux.  She currently takes losartan-HCTZ and amlodipine daily for hypertension. She states that she took her blood pressure medication this morning but ate salty food last night. She admits to headaches, chest pain, SOB, edema.   Last mammogram was 07/2014. Colonoscopy--received letter that it is time for repeat colonoscopy.   Allergies  Allergen Reactions  . Penicillins     Childhood allergy   Past Medical History  Diagnosis Date  . Hypertension   . Acid reflux    Current Outpatient Prescriptions on File Prior to Visit  Medication Sig Dispense Refill  . amLODipine (NORVASC) 5 MG tablet Take 1 tablet (5 mg total) by mouth daily. 30 tablet 0  . calcium carbonate (TUMS - DOSED IN MG ELEMENTAL CALCIUM) 500 MG chewable tablet Chew 2 tablets by mouth daily.    . cholecalciferol (VITAMIN D) 1000 UNITS tablet Take 1,000 Units by mouth daily.    Marland Kitchen losartan-hydrochlorothiazide (HYZAAR) 100-25 MG per tablet Take 1 tablet by mouth daily. 30 tablet 0  . Multiple Vitamins-Minerals (CENTRUM SILVER ADULT 50+ PO) Take 1 tablet by mouth daily.    . ranitidine (ZANTAC) 150 MG capsule Take 1 capsule (150 mg total) by mouth 2 (two) times daily. 30 capsule 0  . cyclobenzaprine (FLEXERIL) 10 MG tablet Take 1 tablet (10 mg total) by mouth 2 (two) times daily as needed for muscle spasms. (Patient not taking: Reported on 04/01/2015) 20 tablet 0   No current facility-administered medications on file prior to visit.   No family history on file. History   Social History  . Marital Status: Married    Spouse Name: N/A  . Number of Children: N/A  . Years of  Education: N/A   Occupational History  . Not on file.   Social History Main Topics  . Smoking status: Never Smoker   . Smokeless tobacco: Not on file  . Alcohol Use: No  . Drug Use: No  . Sexual Activity: Not on file   Other Topics Concern  . Not on file   Social History Narrative    Review of Systems: See HPI   Objective:   Filed Vitals:   04/01/15 1530  BP: 152/95  Pulse: 95    Physical Exam  Constitutional: She is oriented to person, place, and time.  Cardiovascular: Normal rate, regular rhythm and normal heart sounds.   Pulmonary/Chest: Effort normal and breath sounds normal.  Musculoskeletal: She exhibits edema (trace edema).  Neurological: She is alert and oriented to person, place, and time.  Skin: Skin is warm and dry.     Lab Results  Component Value Date   WBC 4.4 07/27/2013   HGB 14.3 07/27/2013   HCT 42.0 07/27/2013   MCV 73.2* 07/27/2013   PLT 283 07/27/2013   Lab Results  Component Value Date   CREATININE 1.00 07/27/2013   BUN 8 07/27/2013   NA 139 07/27/2013   K 3.7 07/27/2013   CL 96 07/27/2013    No results found for: HGBA1C Lipid Panel  No results found for: CHOL, TRIG, HDL, CHOLHDL, VLDL, LDLCALC  Assessment and plan:   Tamara Russell was seen today for new patient.  Diagnoses and all orders for this visit:  Essential hypertension Orders: -     Refill amLODipine (NORVASC) 5 MG tablet; Take 1 tablet (5 mg total) by mouth daily. -     Refill losartan-hydrochlorothiazide (HYZAAR) 100-25 MG per tablet; Take 1 tablet by mouth daily. -     COMPLETE METABOLIC PANEL WITH GFR If BP is still elevated >140/90 then I will increase her amlodipine to 10 mg daily.  Dash diet, weight loss recommended   Need for Tdap vaccination Orders: -     Tdap vaccine greater than or equal to 7yo IM   Return in about 3 weeks (around 04/22/2015) for Nurse Visit--BP check and 3 mo PCP .      Holland Commons, NP-C [facility] [phone] 04/01/2015, 3:37 PM

## 2015-04-05 ENCOUNTER — Other Ambulatory Visit: Payer: Self-pay | Admitting: Internal Medicine

## 2015-04-05 DIAGNOSIS — E876 Hypokalemia: Secondary | ICD-10-CM

## 2015-04-05 MED ORDER — POTASSIUM CHLORIDE ER 10 MEQ PO TBCR: 10.0000 meq | EXTENDED_RELEASE_TABLET | Freq: Every day | ORAL | Status: DC

## 2015-04-06 ENCOUNTER — Telehealth: Payer: Self-pay | Admitting: Internal Medicine

## 2015-04-06 NOTE — Telephone Encounter (Signed)
Patient called returning call, please f/u

## 2015-04-30 ENCOUNTER — Ambulatory Visit: Payer: Self-pay | Attending: Internal Medicine | Admitting: *Deleted

## 2015-04-30 ENCOUNTER — Other Ambulatory Visit: Payer: Self-pay | Admitting: Pharmacist

## 2015-04-30 VITALS — BP 138/84 | HR 70 | Temp 97.8°F | Resp 20 | Wt 229.4 lb

## 2015-04-30 DIAGNOSIS — I10 Essential (primary) hypertension: Secondary | ICD-10-CM | POA: Insufficient documentation

## 2015-04-30 DIAGNOSIS — Z87891 Personal history of nicotine dependence: Secondary | ICD-10-CM | POA: Insufficient documentation

## 2015-04-30 MED ORDER — RANITIDINE HCL 150 MG PO CAPS: 150.0000 mg | ORAL_CAPSULE | Freq: Two times a day (BID) | ORAL | Status: DC

## 2015-04-30 MED ORDER — RANITIDINE HCL 150 MG PO TABS: 150.0000 mg | ORAL_TABLET | Freq: Two times a day (BID) | ORAL | Status: DC

## 2015-04-30 NOTE — Progress Notes (Signed)
Patient presents for BP check after restarting amlodipine 5 mg and hyzaar 100-25 Med list reviewed; states taking all meds as directed Discussed need for low sodium diet and using Mrs. Dash as alternative to salt Encouraged to choose foods with 5% or less of daily value for sodium. Discussed walking 30 minutes per day for exercise Patient denies headaches, blurred vision, SHOB, chest pain  C/o edema both ankles. Has been previously instructed to elevate legs while lying by PCP Encouraged patient to elevate legs while sitting also and to follow low sodium diet for this as well Requesting refill on ranitidine for GERD. [facility]   Filed Vitals:   04/30/15 1606  BP: 138/84  Pulse: 70  Temp: 97.8 F (36.6 C)  Resp: 20     Patient advised to call for med refills at least 7 days before running out so as not to go without.  Patient aware that she is to f/u with PCP 3 months from last visit (Due 07/02/15)  Patient given literature on DASH Eating Plan, GERD Food Choices and edema

## 2015-04-30 NOTE — Patient Instructions (Addendum)
Food Choices for Gastroesophageal Reflux Disease When you have gastroesophageal reflux disease (GERD), the foods you eat and your eating habits are very important. Choosing the right foods can help ease the discomfort of GERD. WHAT GENERAL GUIDELINES DO I NEED TO FOLLOW?  Choose fruits, vegetables, whole grains, low-fat dairy products, and low-fat meat, fish, and poultry.  Limit fats such as oils, salad dressings, butter, nuts, and avocado.  Keep a food diary to identify foods that cause symptoms.  Avoid foods that cause reflux. These may be different for different people.  Eat frequent small meals instead of three large meals each day.  Eat your meals slowly, in a relaxed setting.  Limit fried foods.  Cook foods using methods other than frying.  Avoid drinking alcohol.  Avoid drinking large amounts of liquids with your meals.  Avoid bending over or lying down until 2-3 hours after eating. WHAT FOODS ARE NOT RECOMMENDED? The following are some foods and drinks that may worsen your symptoms: Vegetables Tomatoes. Tomato juice. Tomato and spaghetti sauce. Chili peppers. Onion and garlic. Horseradish. Fruits Oranges, grapefruit, and lemon (fruit and juice). Meats High-fat meats, fish, and poultry. This includes hot dogs, ribs, ham, sausage, salami, and bacon. Dairy Whole milk and chocolate milk. Sour cream. Cream. Butter. Ice cream. Cream cheese.  Beverages Coffee and tea, with or without caffeine. Carbonated beverages or energy drinks. Condiments Hot sauce. Barbecue sauce.  Sweets/Desserts Chocolate and cocoa. Donuts. Peppermint and spearmint. Fats and Oils High-fat foods, including [place] fries and potato chips. Other Vinegar. Strong spices, such as black pepper, white pepper, red pepper, cayenne, curry powder, cloves, ginger, and chili powder. The items listed above may not be a complete list of foods and beverages to avoid. Contact your dietitian for more  information. Document Released: 10/02/2005 Document Revised: 10/07/2013 Document Reviewed: 08/06/2013 [facility] Patient Information 2015 [place], [place]. This information is not intended to replace advice given to you by your health care provider. Make sure you discuss any questions you have with your health care provider. DASH Eating Plan DASH stands for "Dietary Approaches to Stop Hypertension." The DASH eating plan is a healthy eating plan that has been shown to reduce high blood pressure (hypertension). Additional health benefits may include reducing the risk of type 2 diabetes mellitus, heart disease, and stroke. The DASH eating plan may also help with weight loss. WHAT DO I NEED TO KNOW ABOUT THE DASH EATING PLAN? For the DASH eating plan, you will follow these general guidelines:  Choose foods with a percent daily value for sodium of less than 5% (as listed on the food label).  Use salt-free seasonings or herbs instead of table salt or sea salt.  Check with your health care provider or pharmacist before using salt substitutes.  Eat lower-sodium products, often labeled as "lower sodium" or "no salt added."  Eat fresh foods.  Eat more vegetables, fruits, and low-fat dairy products.  Choose whole grains. Look for the word "whole" as the first word in the ingredient list.  Choose fish and skinless chicken or [place] more often than red meat. Limit fish, poultry, and meat to 6 oz (170 g) each day.  Limit sweets, desserts, sugars, and sugary drinks.  Choose heart-healthy fats.  Limit cheese to 1 oz (28 g) per day.  Eat more home-cooked food and less restaurant, buffet, and fast food.  Limit fried foods.  Cook foods using methods other than frying.  Limit canned vegetables. If you do use them, rinse them well to  decrease the sodium.  When eating at a restaurant, ask that your food be prepared with less salt, or no salt if possible. WHAT FOODS CAN I EAT? Seek help from a  dietitian for individual calorie needs. Grains Whole grain or whole wheat bread. Brown rice. Whole grain or whole wheat pasta. Quinoa, bulgur, and whole grain cereals. Low-sodium cereals. Corn or whole wheat flour tortillas. Whole grain cornbread. Whole grain crackers. Low-sodium crackers. Vegetables Fresh or frozen vegetables (raw, steamed, roasted, or grilled). Low-sodium or reduced-sodium tomato and vegetable juices. Low-sodium or reduced-sodium tomato sauce and paste. Low-sodium or reduced-sodium canned vegetables.  Fruits All fresh, canned (in natural juice), or frozen fruits. Meat and Other Protein Products Ground beef (85% or leaner), grass-fed beef, or beef trimmed of fat. Skinless chicken or [place]. Ground chicken or [place]. Pork trimmed of fat. All fish and seafood. Eggs. Dried beans, peas, or lentils. Unsalted nuts and seeds. Unsalted canned beans. Dairy Low-fat dairy products, such as skim or 1% milk, 2% or reduced-fat cheeses, low-fat ricotta or cottage cheese, or plain low-fat yogurt. Low-sodium or reduced-sodium cheeses. Fats and Oils Tub margarines without trans fats. Light or reduced-fat mayonnaise and salad dressings (reduced sodium). Avocado. Safflower, olive, or canola oils. Natural peanut or almond butter. Other Unsalted popcorn and pretzels. The items listed above may not be a complete list of recommended foods or beverages. Contact your dietitian for more options. WHAT FOODS ARE NOT RECOMMENDED? Grains White bread. White pasta. White rice. Refined cornbread. Bagels and croissants. Crackers that contain trans fat. Vegetables Creamed or fried vegetables. Vegetables in a cheese sauce. Regular canned vegetables. Regular canned tomato sauce and paste. Regular tomato and vegetable juices. Fruits Dried fruits. Canned fruit in light or heavy syrup. Fruit juice. Meat and Other Protein Products Fatty cuts of meat. Ribs, chicken wings, bacon, sausage, bologna, salami,  chitterlings, fatback, hot dogs, bratwurst, and packaged luncheon meats. Salted nuts and seeds. Canned beans with salt. Dairy Whole or 2% milk, cream, half-and-half, and cream cheese. Whole-fat or sweetened yogurt. Full-fat cheeses or blue cheese. Nondairy creamers and whipped toppings. Processed cheese, cheese spreads, or cheese curds. Condiments Onion and garlic salt, seasoned salt, table salt, and sea salt. Canned and packaged gravies. Worcestershire sauce. Tartar sauce. Barbecue sauce. Teriyaki sauce. Soy sauce, including reduced sodium. Steak sauce. Fish sauce. Oyster sauce. Cocktail sauce. Horseradish. Ketchup and mustard. Meat flavorings and tenderizers. Bouillon cubes. Hot sauce. Tabasco sauce. Marinades. Taco seasonings. Relishes. Fats and Oils Butter, stick margarine, lard, shortening, ghee, and bacon fat. Coconut, palm kernel, or palm oils. Regular salad dressings. Other Pickles and olives. Salted popcorn and pretzels. The items listed above may not be a complete list of foods and beverages to avoid. Contact your dietitian for more information. WHERE CAN I FIND MORE INFORMATION? National Heart, Lung, and Blood Institute: [email or web address] Document Released: 09/21/2011 Document Revised: 02/16/2014 Document Reviewed: 08/06/2013 [facility] Patient Information 2015 [place], [place]. This information is not intended to replace advice given to you by your health care provider. Make sure you discuss any questions you have with your health care provider. Edema Edema is an abnormal buildup of fluids in your bodytissues. Edema is somewhatdependent on gravity to pull the fluid to the lowest place in your body. That makes the condition more common in the legs and thighs (lower extremities). Painless swelling of the feet and ankles is common and becomes more likely as you get older. It is also common in looser tissues, like around your eyes.  When the affected  area is  squeezed, the fluid may move out of that spot and leave a dent for a few moments. This dent is called pitting.  CAUSES  There are many possible causes of edema. Eating too much salt and being on your feet or sitting for a long time can cause edema in your legs and ankles. Hot weather may make edema worse. Common medical causes of edema include:  Heart failure.  Liver disease.  Kidney disease.  Weak blood vessels in your legs.  Cancer.  An injury.  Pregnancy.  Some medications.  Obesity. SYMPTOMS  Edema is usually painless.Your skin may look swollen or shiny.  DIAGNOSIS  Your health care provider may be able to diagnose edema by asking about your medical history and doing a physical exam. You may need to have tests such as X-rays, an electrocardiogram, or blood tests to check for medical conditions that may cause edema.  TREATMENT  Edema treatment depends on the cause. If you have heart, liver, or kidney disease, you need the treatment appropriate for these conditions. General treatment may include:  Elevation of the affected body part above the level of your heart.  Compression of the affected body part. Pressure from elastic bandages or support stockings squeezes the tissues and forces fluid back into the blood vessels. This keeps fluid from entering the tissues.  Restriction of fluid and salt intake.  Use of a water pill (diuretic). These medications are appropriate only for some types of edema. They pull fluid out of your body and make you urinate more often. This gets rid of fluid and reduces swelling, but diuretics can have side effects. Only use diuretics as directed by your health care provider. HOME CARE INSTRUCTIONS   Keep the affected body part above the level of your heart when you are lying down.   Do not sit still or stand for prolonged periods.   Do not put anything directly under your knees when lying down.  Do not wear constricting clothing or garters on  your upper legs.   Exercise your legs to work the fluid back into your blood vessels. This may help the swelling go down.   Wear elastic bandages or support stockings to reduce ankle swelling as directed by your health care provider.   Eat a low-salt diet to reduce fluid if your health care provider recommends it.   Only take medicines as directed by your health care provider. SEEK MEDICAL CARE IF:   Your edema is not responding to treatment.  You have heart, liver, or kidney disease and notice symptoms of edema.  You have edema in your legs that does not improve after elevating them.   You have sudden and unexplained weight gain. SEEK IMMEDIATE MEDICAL CARE IF:   You develop shortness of breath or chest pain.   You cannot breathe when you lie down.  You develop pain, redness, or warmth in the swollen areas.   You have heart, liver, or kidney disease and suddenly get edema.  You have a fever and your symptoms suddenly get worse. MAKE SURE YOU:   Understand these instructions.  Will watch your condition.  Will get help right away if you are not doing well or get worse. Document Released: 10/02/2005 Document Revised: 02/16/2014 Document Reviewed: 07/25/2013 [facility] Patient Information 2015 [place], [place]. This information is not intended to replace advice given to you by your health care provider. Make sure you discuss any questions you have with your health care provider.

## 2015-05-20 ENCOUNTER — Encounter: Payer: Self-pay | Admitting: *Deleted

## 2015-09-17 ENCOUNTER — Other Ambulatory Visit: Payer: Self-pay | Admitting: Internal Medicine

## 2015-09-23 ENCOUNTER — Encounter: Payer: Self-pay | Admitting: Internal Medicine

## 2015-09-23 ENCOUNTER — Ambulatory Visit: Payer: Self-pay | Attending: Internal Medicine | Admitting: Internal Medicine

## 2015-09-23 VITALS — BP 140/88 | HR 70 | Temp 98.5°F | Resp 16 | Ht 64.0 in | Wt 232.0 lb

## 2015-09-23 DIAGNOSIS — Z6839 Body mass index (BMI) 39.0-39.9, adult: Secondary | ICD-10-CM | POA: Insufficient documentation

## 2015-09-23 DIAGNOSIS — Z0000 Encounter for general adult medical examination without abnormal findings: Secondary | ICD-10-CM

## 2015-09-23 DIAGNOSIS — Z79899 Other long term (current) drug therapy: Secondary | ICD-10-CM | POA: Insufficient documentation

## 2015-09-23 DIAGNOSIS — I10 Essential (primary) hypertension: Secondary | ICD-10-CM

## 2015-09-23 LAB — LIPID PANEL
CHOLESTEROL: 146 mg/dL (ref 125–200)
HDL: 72 mg/dL (ref >=46)
LDL Cholesterol: 66 mg/dL (ref <130)
Total CHOL/HDL Ratio: 2 Ratio (ref <=5.0)
Triglycerides: 42 mg/dL (ref <150)
VLDL: 8 mg/dL (ref <30)

## 2015-09-23 LAB — BASIC METABOLIC PANEL
BUN: 12 mg/dL (ref 7–25)
CALCIUM: 9.4 mg/dL (ref 8.6–10.4)
CO2: 35 mmol/L — ABNORMAL HIGH (ref 20–31)
CREATININE: 0.68 mg/dL (ref 0.50–1.05)
Chloride: 99 mmol/L (ref 98–110)
Glucose, Bld: 81 mg/dL (ref 65–99)
Potassium: 4.2 mmol/L (ref 3.5–5.3)
Sodium: 143 mmol/L (ref 135–146)

## 2015-09-23 MED ORDER — LOSARTAN POTASSIUM-HCTZ 100-25 MG PO TABS: 1.0000 | ORAL_TABLET | Freq: Every day | ORAL | Status: DC

## 2015-09-23 MED ORDER — AMLODIPINE BESYLATE 5 MG PO TABS: 5.0000 mg | ORAL_TABLET | Freq: Every day | ORAL | Status: DC

## 2015-09-23 NOTE — Progress Notes (Signed)
Patient ID: Tamara Russell, female   DOB: [birth year], 54 y.o.   MRN: [id number] Subjective:  Tamara Russell is a 54 y.o. female with hypertension. Patient reports that she has been out of her blood pressure medication for one day. She was taking medication daily without complications. She denies complaints of chest pain, SOB, edema, palpitations.  She is due for a repeat colonoscopy but is uninsured.   Current Outpatient Prescriptions  Medication Sig Dispense Refill  . amLODipine (NORVASC) 5 MG tablet TAKE 1 TABLET BY MOUTH DAILY 30 tablet 1  . calcium carbonate (TUMS - DOSED IN MG ELEMENTAL CALCIUM) 500 MG chewable tablet Chew 2 tablets by mouth daily.    . cholecalciferol (VITAMIN D) 1000 UNITS tablet Take 1,000 Units by mouth daily.    Marland Kitchen. losartan-hydrochlorothiazide (HYZAAR) 100-25 MG per tablet Take 1 tablet by mouth daily. 30 tablet 4  . Multiple Vitamins-Minerals (CENTRUM SILVER ADULT 50+ PO) Take 1 tablet by mouth daily.    . ranitidine (ZANTAC) 150 MG tablet Take 1 tablet (150 mg total) by mouth 2 (two) times daily. 30 tablet 2  . cyclobenzaprine (FLEXERIL) 10 MG tablet Take 1 tablet (10 mg total) by mouth 2 (two) times daily as needed for muscle spasms. (Patient not taking: Reported on 04/01/2015) 20 tablet 0  . potassium chloride (K-DUR) 10 MEQ tablet Take 1 tablet (10 mEq total) by mouth daily. (Patient not taking: Reported on 04/30/2015) 5 tablet 0   No current facility-administered medications for this visit.    Hypertension ROS: taking medications as instructed, no medication side effects noted, no TIA's, no chest pain on exertion, no dyspnea on exertion, no swelling of ankles and no palpitations. All other systems negative.  Objective:  BP 140/88 mmHg  Pulse 70  Temp(Src) 98.5 F (36.9 C)  Resp 16  Ht 5\' 4"  (1.626 m)  Wt 232 lb (105.235 kg)  BMI 39.80 kg/m2  SpO2 100%  Appearance alert, well appearing, and in no distress, oriented to person, place, and time and  overweight. General exam BP noted to be borderline elevated today in office though normal at other visits, S1, S2 normal, no gallop, no murmur, chest clear, no JVD, no HSM, no edema.  Lab review: orders written for new lab studies as appropriate; see orders.   Assessment:   Luetta NuttingHattie was seen today for follow-up.  Diagnoses and all orders for this visit:  Essential hypertension -     amLODipine (NORVASC) 5 MG tablet; Take 1 tablet (5 mg total) by mouth daily. -     losartan-hydrochlorothiazide (HYZAAR) 100-25 MG tablet; Take 1 tablet by mouth daily. -     Basic metabolic panel BP slightly elevated today but she has been out of her medication. Weight loss, DASH diet advised.   Morbid obesity, unspecified obesity type (HCC) -     Lipid panel Weight loss discussed at length and its complications to health.  Patient will loss 10 pounds by next visit in 3 months.  Diet and exercise discussed as well as calorie intake.  Healthcare maintenance -     Hepatitis panel, acute -     HIV antibody (with reflex)  Return in about 6 months (around 03/23/2016) for Hypertension.   Ambrose FinlandValerie A Demarquis Osley, NP 09/23/2015 11:17 AM

## 2015-09-23 NOTE — Progress Notes (Signed)
Patient here for follow up on her HTN and for medication refills 

## 2015-09-24 ENCOUNTER — Telehealth: Payer: Self-pay

## 2015-09-24 LAB — HIV ANTIBODY (ROUTINE TESTING W REFLEX): HIV 1&2 Ab, 4th Generation: NONREACTIVE

## 2015-09-24 LAB — HEPATITIS PANEL, ACUTE
HCV Ab: NEGATIVE
HEP A IGM: NONREACTIVE
HEP B C IGM: NONREACTIVE
HEP B S AG: NEGATIVE

## 2015-09-24 NOTE — Telephone Encounter (Signed)
Tried to contact patient about her lab results Patient not available Message left on voice mail to return our cll

## 2015-09-24 NOTE — Telephone Encounter (Signed)
-----   Message from Ambrose FinlandValerie A Keck, NP sent at 09/24/2015  2:10 PM EST ----- Labs are within normal limits

## 2015-09-27 ENCOUNTER — Telehealth: Payer: Self-pay

## 2015-09-27 NOTE — Telephone Encounter (Signed)
Patient called wanting lab results. 

## 2015-09-27 NOTE — Telephone Encounter (Signed)
Returned patient phone call and she is aware of her normal lab results

## 2015-10-20 MED FILL — LOSARTAN-HCTZ 100-25 MG TAB: 100-25 | 30 days supply | Qty: 30 | Fill #1

## 2015-10-20 MED FILL — AMLODIPINE BESYLATE 5 MG TA: 5 | 30 days supply | Qty: 30 | Fill #1 | Status: TO

## 2015-11-19 ENCOUNTER — Inpatient Hospital Stay (HOSPITAL_COMMUNITY)
Admission: EM | Admit: 2015-11-19 | Discharge: 2015-11-23 | DRG: 871 | Disposition: A | Discharge status: Home / Self Care | Payer: BLUE CROSS/BLUE SHIELD | Attending: Internal Medicine | Admitting: Internal Medicine

## 2015-11-19 ENCOUNTER — Emergency Department (HOSPITAL_COMMUNITY): Payer: BLUE CROSS/BLUE SHIELD

## 2015-11-19 ENCOUNTER — Encounter (HOSPITAL_COMMUNITY): Payer: Self-pay | Admitting: Emergency Medicine

## 2015-11-19 DIAGNOSIS — Z809 Family history of malignant neoplasm, unspecified: Secondary | ICD-10-CM | POA: Diagnosis not present

## 2015-11-19 DIAGNOSIS — R0902 Hypoxemia: Secondary | ICD-10-CM

## 2015-11-19 DIAGNOSIS — K219 Gastro-esophageal reflux disease without esophagitis: Secondary | ICD-10-CM | POA: Diagnosis present

## 2015-11-19 DIAGNOSIS — I10 Essential (primary) hypertension: Secondary | ICD-10-CM | POA: Diagnosis present

## 2015-11-19 DIAGNOSIS — Z87891 Personal history of nicotine dependence: Secondary | ICD-10-CM | POA: Diagnosis not present

## 2015-11-19 DIAGNOSIS — J9601 Acute respiratory failure with hypoxia: Secondary | ICD-10-CM | POA: Diagnosis present

## 2015-11-19 DIAGNOSIS — J440 Chronic obstructive pulmonary disease with acute lower respiratory infection: Secondary | ICD-10-CM | POA: Diagnosis present

## 2015-11-19 DIAGNOSIS — J209 Acute bronchitis, unspecified: Secondary | ICD-10-CM | POA: Diagnosis present

## 2015-11-19 DIAGNOSIS — Z8249 Family history of ischemic heart disease and other diseases of the circulatory system: Secondary | ICD-10-CM | POA: Diagnosis not present

## 2015-11-19 DIAGNOSIS — Z880 Allergy status to penicillin: Secondary | ICD-10-CM | POA: Diagnosis not present

## 2015-11-19 DIAGNOSIS — A419 Sepsis, unspecified organism: Principal | ICD-10-CM | POA: Diagnosis present

## 2015-11-19 DIAGNOSIS — R05 Cough: Secondary | ICD-10-CM | POA: Diagnosis present

## 2015-11-19 DIAGNOSIS — R059 Cough, unspecified: Secondary | ICD-10-CM

## 2015-11-19 DIAGNOSIS — J9621 Acute and chronic respiratory failure with hypoxia: Secondary | ICD-10-CM | POA: Diagnosis present

## 2015-11-19 DIAGNOSIS — J189 Pneumonia, unspecified organism: Secondary | ICD-10-CM | POA: Diagnosis present

## 2015-11-19 DIAGNOSIS — N179 Acute kidney failure, unspecified: Secondary | ICD-10-CM | POA: Diagnosis present

## 2015-11-19 LAB — BASIC METABOLIC PANEL
ANION GAP: 16 — AB (ref 5–15)
BUN: 18 mg/dL (ref 6–20)
CHLORIDE: 89 mmol/L — AB (ref 101–111)
CO2: 33 mmol/L — ABNORMAL HIGH (ref 22–32)
Calcium: 9.4 mg/dL (ref 8.9–10.3)
Creatinine, Ser: 1.11 mg/dL — ABNORMAL HIGH (ref 0.44–1.00)
GFR calc Af Amer: >60 mL/min (ref >60)
GFR, EST NON AFRICAN AMERICAN: 54 mL/min — AB (ref >60)
GLUCOSE: 114 mg/dL — AB (ref 65–99)
POTASSIUM: 3.5 mmol/L (ref 3.5–5.1)
Sodium: 138 mmol/L (ref 135–145)

## 2015-11-19 LAB — CBC
HEMATOCRIT: 45.9 % (ref 36.0–46.0)
HEMOGLOBIN: 15 g/dL (ref 12.0–15.0)
MCH: 24.6 pg — ABNORMAL LOW (ref 26.0–34.0)
MCHC: 32.7 g/dL (ref 30.0–36.0)
MCV: 75.4 fL — AB (ref 78.0–100.0)
Platelets: 268 10*3/uL (ref 150–400)
RBC: 6.09 MIL/uL — ABNORMAL HIGH (ref 3.87–5.11)
RDW: 16 % — ABNORMAL HIGH (ref 11.5–15.5)
WBC: 4.8 10*3/uL (ref 4.0–10.5)

## 2015-11-19 LAB — APTT: APTT: 34 s (ref 24–37)

## 2015-11-19 LAB — PROTIME-INR
INR: 1.08 (ref 0.00–1.49)
Prothrombin Time: 14.2 seconds (ref 11.6–15.2)

## 2015-11-19 LAB — TROPONIN I

## 2015-11-19 LAB — LACTIC ACID, PLASMA: Lactic Acid, Venous: 2.9 mmol/L (ref 0.5–2.0)

## 2015-11-19 LAB — PROCALCITONIN: PROCALCITONIN: 0.14 ng/mL

## 2015-11-19 LAB — BRAIN NATRIURETIC PEPTIDE: B Natriuretic Peptide: 12.9 pg/mL (ref 0.0–100.0)

## 2015-11-19 MED ORDER — IPRATROPIUM BROMIDE 0.02 % IN SOLN
0.5000 mg | Freq: Once | RESPIRATORY_TRACT | Status: AC
  Administered 2015-11-19: 0.5 mg via RESPIRATORY_TRACT
  Filled 2015-11-19: qty 2.5

## 2015-11-19 MED ORDER — VITAMIN D 1000 UNITS PO TABS: 1000.0000 [IU] | ORAL_TABLET | Freq: Every day | ORAL | Status: DC

## 2015-11-19 MED ORDER — HYDRALAZINE HCL 20 MG/ML IJ SOLN: 5.0000 mg | INTRAMUSCULAR | Status: DC | PRN

## 2015-11-19 MED ORDER — FAMOTIDINE 20 MG PO TABS: 20.0000 mg | ORAL_TABLET | Freq: Every day | ORAL | Status: DC

## 2015-11-19 MED ORDER — LEVOFLOXACIN IN D5W 750 MG/150ML IV SOLN: 750.0000 mg | INTRAVENOUS | Status: DC

## 2015-11-19 MED ORDER — AMLODIPINE BESYLATE 5 MG PO TABS: 5.0000 mg | ORAL_TABLET | Freq: Every day | ORAL | Status: DC

## 2015-11-19 MED ORDER — ALBUTEROL SULFATE (2.5 MG/3ML) 0.083% IN NEBU: 5.0000 mg | INHALATION_SOLUTION | RESPIRATORY_TRACT | Status: DC | PRN

## 2015-11-19 MED ORDER — SODIUM CHLORIDE 0.9 % IV BOLUS (SEPSIS)
500.0000 mL | Freq: Once | INTRAVENOUS | Status: AC
  Administered 2015-11-19: 500 mL via INTRAVENOUS

## 2015-11-19 MED ORDER — LEVOFLOXACIN IN D5W 750 MG/150ML IV SOLN
750.0000 mg | INTRAVENOUS | Status: DC
  Administered 2015-11-20: 750 mg via INTRAVENOUS
  Filled 2015-11-19 (×2): qty 150

## 2015-11-19 MED ORDER — VITAMIN D 1000 UNITS PO TABS
1000.0000 [IU] | ORAL_TABLET | Freq: Every day | ORAL | Status: DC
  Administered 2015-11-19 – 2015-11-23 (×5): 1000 [IU] via ORAL
  Filled 2015-11-19 (×5): qty 1

## 2015-11-19 MED ORDER — PREDNISONE 10 MG PO TABS
60.0000 mg | ORAL_TABLET | Freq: Every day | ORAL | Status: DC
  Filled 2015-11-19: qty 1

## 2015-11-19 MED ORDER — DM-GUAIFENESIN ER 30-600 MG PO TB12
1.0000 | ORAL_TABLET | Freq: Two times a day (BID) | ORAL | Status: DC
  Administered 2015-11-19 – 2015-11-23 (×7): 1 via ORAL
  Filled 2015-11-19 (×8): qty 1

## 2015-11-19 MED ORDER — IPRATROPIUM BROMIDE 0.02 % IN SOLN
0.5000 mg | RESPIRATORY_TRACT | Status: DC
  Administered 2015-11-20: 0.5 mg via RESPIRATORY_TRACT
  Filled 2015-11-19 (×2): qty 2.5

## 2015-11-19 MED ORDER — ALBUTEROL SULFATE (2.5 MG/3ML) 0.083% IN NEBU
5.0000 mg | INHALATION_SOLUTION | Freq: Once | RESPIRATORY_TRACT | Status: AC
  Administered 2015-11-19: 5 mg via RESPIRATORY_TRACT
  Filled 2015-11-19: qty 6

## 2015-11-19 MED ORDER — SODIUM CHLORIDE 0.9 % IV BOLUS (SEPSIS): 1000.0000 mL | Freq: Once | INTRAVENOUS | Status: DC

## 2015-11-19 MED ORDER — PREDNISONE 20 MG PO TABS
60.0000 mg | ORAL_TABLET | Freq: Once | ORAL | Status: AC
  Administered 2015-11-19: 60 mg via ORAL
  Filled 2015-11-19: qty 3

## 2015-11-19 MED ORDER — ALBUTEROL SULFATE (2.5 MG/3ML) 0.083% IN NEBU: 5.0000 mg | INHALATION_SOLUTION | RESPIRATORY_TRACT | Status: AC | PRN

## 2015-11-19 MED ORDER — ADULT MULTIVITAMIN W/MINERALS CH
1.0000 | ORAL_TABLET | Freq: Every day | ORAL | Status: DC
  Administered 2015-11-19 – 2015-11-23 (×5): 1 via ORAL
  Filled 2015-11-19 (×5): qty 1

## 2015-11-19 MED ORDER — ACETAMINOPHEN 325 MG PO TABS
650.0000 mg | ORAL_TABLET | Freq: Four times a day (QID) | ORAL | Status: DC | PRN
  Administered 2015-11-20: 650 mg via ORAL
  Filled 2015-11-19: qty 2

## 2015-11-19 MED ORDER — DEXTROSE 5 % IV SOLN
500.0000 mg | Freq: Once | INTRAVENOUS | Status: AC
  Administered 2015-11-19: 500 mg via INTRAVENOUS
  Filled 2015-11-19: qty 500

## 2015-11-19 MED ORDER — ENOXAPARIN SODIUM 40 MG/0.4ML ~~LOC~~ SOLN
40.0000 mg | SUBCUTANEOUS | Status: DC
  Administered 2015-11-19 – 2015-11-21 (×3): 40 mg via SUBCUTANEOUS
  Filled 2015-11-19 (×4): qty 0.4

## 2015-11-19 MED ORDER — CEFTRIAXONE SODIUM 1 G IJ SOLR
1.0000 g | Freq: Once | INTRAMUSCULAR | Status: AC
  Administered 2015-11-19: 1 g via INTRAVENOUS
  Filled 2015-11-19: qty 10

## 2015-11-19 MED ORDER — AMLODIPINE BESYLATE 5 MG PO TABS
5.0000 mg | ORAL_TABLET | Freq: Every day | ORAL | Status: DC
  Administered 2015-11-19: 5 mg via ORAL
  Filled 2015-11-19: qty 1

## 2015-11-19 MED ORDER — FAMOTIDINE 20 MG PO TABS
20.0000 mg | ORAL_TABLET | Freq: Every day | ORAL | Status: DC
  Administered 2015-11-20 – 2015-11-21 (×2): 20 mg via ORAL
  Filled 2015-11-19 (×5): qty 1

## 2015-11-19 MED ORDER — SODIUM CHLORIDE 0.9 % IV SOLN
INTRAVENOUS | Status: DC
  Administered 2015-11-19: 21:00:00 via INTRAVENOUS

## 2015-11-19 MED ORDER — CENTRUM SILVER ADULT 50+ PO TABS: 1.0000 | ORAL_TABLET | Freq: Every day | ORAL | Status: DC

## 2015-11-19 NOTE — H&P (Addendum)
[facility] History and Physical  Rosy Estabrook ZOX:[id number] DOB: 07-Oct-1961 DOA: 11/19/2015  Referring physician: ED physician PCP: Ambrose Finland, NP  Specialists:   Chief Complaint: Shortness of breath and cough  HPI: Tamara Russell is a 55 y.o. female with PMH of former smoker (smoked half pack a day for 20 years, and quit 25 years ago), GERD, HTN, who presents with cough and SOB.  Patient reports that she has been having cough and shortness of breath for 5 days. She coughs up yellow colored sputum. No chest pain, fever or chills. No runny nose or sore throat. Patient was seen by her PCP today, and found to have oxygen desat to 88%. She was diagnosed as pneumonia and was given antibiotics (patient does not remember the name of antibiotics). She reports that she is taking the medications, but without any improvement. Patient does not have abdominal pain, diarrhea, symptoms of UTI or unilateral weakness. Patient does not have tenderness over calf areas.  In ED, patient was found to have WBC 4.0, negative troponin, BNP 12.9, temperature normal, slightly tachycardia, oxygen saturation in 90s, acute renal injury count. Chest x-ray showed chronic accentuation of perihilar markings without definite acute infiltrate and enlargement of cardiac silhouette. Patient admitted to inpatient for further erection treatment.  EKG: Not done in ED, will get one.   Where does patient live?   At home   Can patient participate in ADLs?  Yes   Review of Systems:   General: no fevers, chills, no changes in body weight, has fatigue HEENT: no blurry vision, hearing changes or sore throat Pulm: has dyspnea, coughing, wheezing CV: no chest pain, palpitations Abd: no nausea, vomiting, abdominal pain, diarrhea, constipation GU: no dysuria, burning on urination, increased urinary frequency, hematuria  Ext: no leg edema Neuro: no unilateral weakness, numbness, or tingling, no vision change or  hearing loss Skin: no rash MSK: No muscle spasm, no deformity, no limitation of range of movement in spin Heme: No easy bruising.  Travel history: No recent long distant travel.  Allergy:  Allergies  Allergen Reactions  . Penicillins Other (See Comments)    Childhood allergy    Past Medical History  Diagnosis Date  . Hypertension   . Acid reflux     Past Surgical History  Procedure Laterality Date  . Abdominal hysterectomy      Social History:  reports that she quit smoking about 20 years ago. Her smoking use included Cigarettes. She started smoking about 40 years ago. She has a 10 pack-year smoking history. She does not have any smokeless tobacco history on file. She reports that she does not drink alcohol or use illicit drugs.  Family History:  Family History  Problem Relation Age of Onset  . Cancer Father 67    stomach  . Hypertension Father      Prior to Admission medications   Medication Sig Start Date End Date Taking? Authorizing Provider  acetaminophen (TYLENOL) 500 MG tablet Take 1,000 mg by mouth every 6 (six) hours as needed for moderate pain.   Yes Historical Provider, MD  amLODipine (NORVASC) 5 MG tablet Take 1 tablet (5 mg total) by mouth daily. 09/23/15  Yes Ambrose Finland, NP  cholecalciferol (VITAMIN D) 1000 UNITS tablet Take 1,000 Units by mouth daily.   Yes Historical Provider, MD  losartan-hydrochlorothiazide (HYZAAR) 100-25 MG tablet Take 1 tablet by mouth daily. 09/23/15  Yes Ambrose Finland, NP  Menthol (HALLS COUGH DROPS MT) Use as directed  1 lozenge in the mouth or throat every 3 (three) hours as needed (for sore throat).   Yes Historical Provider, MD  Multiple Vitamins-Minerals (CENTRUM SILVER ADULT 50+ PO) Take 1 tablet by mouth daily.   Yes Historical Provider, MD  cyclobenzaprine (FLEXERIL) 10 MG tablet Take 1 tablet (10 mg total) by mouth 2 (two) times daily as needed for muscle spasms. Patient not taking: Reported on 04/01/2015 08/08/13   Marlon Pel, PA-C  potassium chloride (K-DUR) 10 MEQ tablet Take 1 tablet (10 mEq total) by mouth daily. Patient not taking: Reported on 04/30/2015 04/05/15   Ambrose Finland, NP  predniSONE (DELTASONE) 10 MG tablet Take 10 mg by mouth 2 (two) times daily. Reported on 11/19/2015 11/06/15   Historical Provider, MD  ranitidine (ZANTAC) 150 MG tablet Take 1 tablet (150 mg total) by mouth 2 (two) times daily. 04/30/15   Ambrose Finland, NP    Physical Exam: Filed Vitals:   11/19/15 1900 11/19/15 1915 11/19/15 1930 11/19/15 1945  BP: 124/104 124/85 122/76 122/107  Pulse: 94 101 99 98  Temp:      TempSrc:      Resp:      Height:      Weight:      SpO2: 92% 93% 91% 90%   General: Not in acute distress HEENT:       Eyes: PERRL, EOMI, no scleral icterus.       ENT: No discharge from the ears and nose, no pharynx injection, no tonsillar enlargement.        Neck: No JVD, no bruit, no mass felt. Heme: No neck lymph node enlargement. Cardiac: S1/S2, RRR, No murmurs, No gallops or rubs. Pulm: Has difffused rhonchi bilaterally. No rales rubs. Abd: Soft, nondistended, nontender, no rebound pain, no organomegaly, BS present. Ext: No pitting leg edema bilaterally. 2+DP/PT pulse bilaterally. Musculoskeletal: No joint deformities, No joint redness or warmth, no limitation of ROM in spin. Skin: No rashes.  Neuro: Alert, oriented X3, cranial nerves II-XII grossly intact, moves all extremities normally. Psych: Patient is not psychotic, no suicidal or hemocidal ideation.  Labs on Admission:  Basic Metabolic Panel:  Recent Labs Lab 11/19/15 1550  NA 138  K 3.5  CL 89*  CO2 33*  GLUCOSE 114*  BUN 18  CREATININE 1.11*  CALCIUM 9.4   Liver Function Tests: No results for input(s): AST, ALT, ALKPHOS, BILITOT, PROT, ALBUMIN in the last 168 hours. No results for input(s): LIPASE, AMYLASE in the last 168 hours. No results for input(s): AMMONIA in the last 168 hours. CBC:  Recent Labs Lab 11/19/15 1550   WBC 4.8  HGB 15.0  HCT 45.9  MCV 75.4*  PLT 268   Cardiac Enzymes:  Recent Labs Lab 11/19/15 1550  TROPONINI <0.03    BNP (last 3 results)  Recent Labs  11/19/15 1550  BNP 12.9    ProBNP (last 3 results) No results for input(s): PROBNP in the last 8760 hours.  CBG: No results for input(s): GLUCAP in the last 168 hours.  Radiological Exams on Admission: Dg Chest 2 View  11/19/2015  CLINICAL DATA:  Shortness of breath, cough, fever for 4 days, low oxygenation, pneumonia, hypertension, former smoker EXAM: CHEST  2 VIEW COMPARISON:  07/27/2013 FINDINGS: Enlargement of cardiac silhouette. Mediastinal contours and pulmonary vascularity normal. Chronic accentuation of perihilar interstitial markings, unchanged. No definite acute infiltrate, pleural effusion or pneumothorax. Bones unremarkable. IMPRESSION: Enlargement of cardiac silhouette. Chronic accentuation of perihilar markings without definite acute infiltrate.  Electronically Signed   By: Ulyses Southward M.D.   On: 11/19/2015 16:17    Assessment/Plan Principal Problem:   Acute respiratory failure with hypoxia (HCC) Active Problems:   HTN (hypertension)   GERD (gastroesophageal reflux disease)   AKI (acute kidney injury) (HCC)   Acute bronchitis   Acute respiratory failure with hypoxia (HCC): Likely due to acute bronchitis versus early stage of pneumonia. No definite infiltration by chest x-ray. Patient is septic on admission with tachycardia and elevated lactate.  - Will admit to Telemetry Bed (due to tachycardia) - received on dose of IV Rocephin and azithromycin, will switch to IV levaquine due to allergy to PCN - Mucinex for cough  - prednisone 60 mg daily x 5 days - Atrovent, albuterol Neb prn for SOB - Urine legionella and S. pneumococcal antigen - Follow up blood culture x2, sputum culture, plus Flu pcr  Sepsis: Patient is septic with tachycardia and elevated lactate. - will get Procalcitonin and trend lactic  acid level - IVF: 1.5L of NS bolus in ED, followed by 100 mL per hour of NS   Addendum: lactic acid trending up from 2.9-->4.0 -will give another 1.5 L of NS bolus and continue to trend lactic acid level.  AKI: Likely due to prerenal secondary to dehydration and continuation of ARB, diruetics - IVF as above - Check FeUrea - Follow up renal function by BMP - Hold Hyzaar  HTN: -Hold Hyzaar due to acute renal injury -Continue amlodipine -IV hydralazine prn  GERD: -Pepcid    DVT ppx:  SQ Lovenox  Code Status: Full code Family Communication: Yes, patient's husband at bed side Disposition Plan: Admit to inpatient   Date of Service 11/19/2015    Lorretta Harp [facility] Pager [phone]  If 7PM-7AM, please contact night-coverage [email or web address] Password [email or web address] 11/19/2015, 9:25 PM

## 2015-11-19 NOTE — Progress Notes (Signed)
NURSING PROGRESS NOTE  Tamara Russell [id number] Admission Data: 11/19/2015 11:48 PM Attending Provider: Lorretta Harp, MD WUJ:[id number] Tamara Jersey, NP Code Status: Full   Allergies:  Penicillins Past Medical History:   has a past medical history of Hypertension and Acid reflux. Past Surgical History:   has past surgical history that includes Abdominal hysterectomy. Social History:   reports that she quit smoking about 20 years ago. Her smoking use included Cigarettes. She started smoking about 40 years ago. She has a 10 pack-year smoking history. She does not have any smokeless tobacco history on file. She reports that she does not drink alcohol or use illicit drugs.  Tamara Russell is a 55 y.o. female patient admitted from ED:   Last Documented Vital Signs: Blood pressure 112/67, pulse 103, temperature 98.1 F (36.7 C), temperature source Oral, resp. rate 16, height  (1.549 m), weight 100.6 kg (221 lb 12.5 oz), SpO2 96 %.  Cardiac Monitoring: Box # 10 in place. Cardiac monitor yields:normal sinus rhythm.  IV Fluids:  IV in place, occlusive dsg intact without redness, IV cath hand left, condition patent and no redness normal saline.   Skin: WDL  Patient/Family orientated to room. Information packet given to patient/family. Admission inpatient armband information verified with patient/family to include name and date of birth and placed on patient arm. Side rails up x 2, fall assessment and education completed with patient/family. Patient/family able to verbalize understanding of risk associated with falls and verbalized understanding to call for assistance before getting out of bed. Call light within reach. Patient/family able to voice and demonstrate understanding of unit orientation instructions.    Will continue to evaluate and treat per MD orders.   Tamara Clara, RN

## 2015-11-19 NOTE — ED Notes (Signed)
Report attempted. Receiving RN busy on the floor.

## 2015-11-19 NOTE — ED Provider Notes (Signed)
CSN: [id number]     Arrival date & time 11/19/15  1528 History   First MD Initiated Contact with Patient 11/19/15 1849     Chief Complaint  Patient presents with  . Pneumonia     (Consider location/radiation/quality/duration/timing/severity/associated sxs/prior Treatment) The history is provided by the patient and medical records.    55 year old female with history of hypertension and GERD, presenting to the ED for pneumonia. Patient states she has had a cough and cold for the past week. She initially felt that she had the flu, however she was seen by PCP today and diagnosed with pneumonia. She was found to be hypoxic in the office to 88% on room air.  On arrival to ED patient was saturating 93% on room air. She is not currently on any home oxygen. She denies any chest pain currently. Patient states overall she feels better today than she has in several days. She did receive antibiotics from PCP earlier today as well as some breathing treatments.  Patient denies history of COPD or asthma. She is a former smoker, smoked half a pack a day for approximately 20 years.  No cardiac hx.  VSS.  Past Medical History  Diagnosis Date  . Hypertension   . Acid reflux    Past Surgical History  Procedure Laterality Date  . Abdominal hysterectomy     Family History  Problem Relation Age of Onset  . Cancer Father 22    stomach  . Hypertension Father    Social History  Substance Use Topics  . Smoking status: Former Smoker -- 0.50 packs/day for 20 years    Types: Cigarettes    Start date: 04/30/1975    Quit date: 04/30/1995  . Smokeless tobacco: None  . Alcohol Use: No   OB History    No data available     Review of Systems  Respiratory: Positive for cough and shortness of breath.   All other systems reviewed and are negative.     Allergies  Penicillins  Home Medications   Prior to Admission medications   Medication Sig Start Date End Date Taking? Authorizing Provider  amLODipine  (NORVASC) 5 MG tablet Take 1 tablet (5 mg total) by mouth daily. 09/23/15   Ambrose Finland, NP  calcium carbonate (TUMS - DOSED IN MG ELEMENTAL CALCIUM) 500 MG chewable tablet Chew 2 tablets by mouth daily.    Historical Provider, MD  cholecalciferol (VITAMIN D) 1000 UNITS tablet Take 1,000 Units by mouth daily.    Historical Provider, MD  cyclobenzaprine (FLEXERIL) 10 MG tablet Take 1 tablet (10 mg total) by mouth 2 (two) times daily as needed for muscle spasms. Patient not taking: Reported on 04/01/2015 08/08/13   Marlon Pel, PA-C  losartan-hydrochlorothiazide (HYZAAR) 100-25 MG tablet Take 1 tablet by mouth daily. 09/23/15   Ambrose Finland, NP  Multiple Vitamins-Minerals (CENTRUM SILVER ADULT 50+ PO) Take 1 tablet by mouth daily.    Historical Provider, MD  potassium chloride (K-DUR) 10 MEQ tablet Take 1 tablet (10 mEq total) by mouth daily. Patient not taking: Reported on 04/30/2015 04/05/15   Ambrose Finland, NP  ranitidine (ZANTAC) 150 MG tablet Take 1 tablet (150 mg total) by mouth 2 (two) times daily. 04/30/15   Ambrose Finland, NP   BP 126/86 mmHg  Pulse 102  Temp(Src) 98 F (36.7 C) (Oral)  Resp 16  Ht  (1.549 m)  Wt 100.699 kg  BMI 41.97 kg/m2  SpO2 93%   Physical Exam  Constitutional: She is oriented to person, place, and time. She appears well-developed and well-nourished. No distress.  obese  HENT:  Head: Normocephalic and atraumatic.  Mouth/Throat: Oropharynx is clear and moist.  Eyes: Conjunctivae and EOM are normal. Pupils are equal, round, and reactive to light.  Neck: Normal range of motion. Neck supple.  Cardiovascular: Normal rate, regular rhythm and normal heart sounds.   Pulmonary/Chest: Effort normal. She has wheezes. She has rhonchi.  Intermixed wheezes and rhonchi; no distress; speaking in full sentences  Abdominal: Soft. Bowel sounds are normal. There is no tenderness. There is no guarding.  Musculoskeletal: Normal range of motion.  Neurological: She is  alert and oriented to person, place, and time.  Skin: Skin is warm and dry. She is not diaphoretic.  Psychiatric: She has a normal mood and affect.  Nursing note and vitals reviewed.   ED Course  Procedures (including critical care time) Labs Review Labs Reviewed  BASIC METABOLIC PANEL - Abnormal; Notable for the following:    Chloride 89 (*)    CO2 33 (*)    Glucose, Bld 114 (*)    Creatinine, Ser 1.11 (*)    GFR calc non Af Amer 54 (*)    Anion gap 16 (*)    All other components within normal limits  CBC - Abnormal; Notable for the following:    RBC 6.09 (*)    MCV 75.4 (*)    MCH 24.6 (*)    RDW 16.0 (*)    All other components within normal limits  CULTURE, BLOOD (ROUTINE X 2)  CULTURE, BLOOD (ROUTINE X 2)  CULTURE, EXPECTORATED SPUTUM-ASSESSMENT  GRAM STAIN  BRAIN NATRIURETIC PEPTIDE  TROPONIN I  CREATININE, URINE, RANDOM  UREA NITROGEN, URINE  INFLUENZA PANEL BY PCR (TYPE A & B, H1N1)  HIV ANTIBODY (ROUTINE TESTING)  STREP PNEUMONIAE URINARY ANTIGEN  LEGIONELLA ANTIGEN, URINE  LACTIC ACID, PLASMA  LACTIC ACID, PLASMA  PROCALCITONIN  PROTIME-INR  APTT    Imaging Review Dg Chest 2 View  11/19/2015  CLINICAL DATA:  Shortness of breath, cough, fever for 4 days, low oxygenation, pneumonia, hypertension, former smoker EXAM: CHEST  2 VIEW COMPARISON:  07/27/2013 FINDINGS: Enlargement of cardiac silhouette. Mediastinal contours and pulmonary vascularity normal. Chronic accentuation of perihilar interstitial markings, unchanged. No definite acute infiltrate, pleural effusion or pneumothorax. Bones unremarkable. IMPRESSION: Enlargement of cardiac silhouette. Chronic accentuation of perihilar markings without definite acute infiltrate. Electronically Signed   By: Ulyses Southward M.D.   On: 11/19/2015 16:17   I have personally reviewed and evaluated these images and lab results as part of my medical decision-making.   EKG Interpretation None      MDM   Final diagnoses:   Hypoxia  Cough   55 year old female here with cough and shortness of breath. Was found to be hypoxic in PCP office and sent here for further evaluation.  Patient is afebrile, nontoxic. She is overall well appearing. During my evaluation her O2 sats were marginal, ranging from 90-95%.  She is in no acute distress.  She does have diffuse rhonchi and wheezes. Patient's lab work is overall reassuring and appears baseline for her.   Her bicarbonate is slightly elevated which appears chronic. Given her smoking history, this is likely due to some component of COPD.  Chest x-ray with increased perihilar markings, no definitive infiltrate. Based on exam findings and her poor oxygenation without known O2 requirement, I feel the patient should be admitted. Patient is strongly against admission at this time.  She will prefer to have some breathing treatments to see if her oxygenation improves. I feel this is reasonable, however she may ultimately need admission.  8:17 PM Went to re-evaluate patient after initial neb and steroids.  Patient is now saturating 89% on RA, sats continued dropping once she stood up to walk to bedside table.  Patient was placed on 2 L supplemental oxygen with improvement of oxygen saturation to 94%. Patient will require admission for her new oxygen requirement. Will start on azithromycin and Rocephin as well.  8:37 PM Case discussed with Dr. Clyde Lundborg, will admit to telemetry bed.  Temp admit orders placed.  VS stable 2L supplemental O2.  Garlon Hatchet, PA-C 11/19/15 7425  Arby Barrette, MD 11/21/15 [phone]

## 2015-11-19 NOTE — ED Notes (Signed)
Pt sent by PCP for eval of dx of PNA and hypoxia. Pt PCP reported pt oxygen level 88% on room air in office, VSS in triage. Pt alert and oriented, denies any cp at this time.

## 2015-11-19 NOTE — Consult Note (Signed)
Pharmacy Antibiotic Note  Tamara Russell is a 55 y.o. female admitted on 11/19/2015 with pneumonia vs bronchitis.  Pharmacy has been consulted for levofloxacin dosing.  Pt received ceftriaxone and azithromycin x1 in the ED. Switched to levofloxacin d/t pencillin allergy.  Plan: Levofloxacin 750 mg IV q24h to start 2/4; stop date entered for 2/8 Monitor renal function, cultures, clinical progression  Height:  (154.9 cm) Weight: 222 lb (100.699 kg) IBW/kg (Calculated) : 47.8  Temp (24hrs), Avg:98 F (36.7 C), Min:98 F (36.7 C), Max:98 F (36.7 C)   Recent Labs Lab 11/19/15 1550  WBC 4.8  CREATININE 1.11*    Estimated Creatinine Clearance: 60.9 mL/min (by C-G formula based on Cr of 1.11).    Allergies  Allergen Reactions  . Penicillins Other (See Comments)    Childhood allergy    Antimicrobials this admission: Ceftriaxone & azithro 2/3 x1 Levofloxacin 2/4 >>   Dose adjustments this admission: n/a  Microbiology results: 2/3 BCx: sent  Thank you for allowing pharmacy to be a part of this patient's care.  Greggory Stallion, PharmD Clinical Pharmacy Resident Pager # [phone] 11/19/2015 9:49 PM

## 2015-11-19 NOTE — Progress Notes (Signed)
Attempted report. RN will call back. 

## 2015-11-19 NOTE — Progress Notes (Signed)
CRITICAL VALUE ALERT  Critical value received:  Lactic acid 2.9  Date of notification:  11/19/15  Time of notification:  2240  Critical value read back:Yes.    Nurse who received alert:  Johnette Abraham  MD notified (1st page):  Claiborne Billings, NP  Time of first page:  2242  MD notified (2nd page):  Time of second page:  Responding MD:  Claiborne Billings, NP   Time MD responded:  2245

## 2015-11-19 NOTE — Progress Notes (Signed)
Received report from Nadine Counts, RN in ED for transfer of pt to [phone].

## 2015-11-19 NOTE — ED Notes (Signed)
Pt called back for room x 3 with no answer.

## 2015-11-20 ENCOUNTER — Encounter (HOSPITAL_COMMUNITY): Payer: Self-pay | Admitting: General Practice

## 2015-11-20 DIAGNOSIS — J209 Acute bronchitis, unspecified: Secondary | ICD-10-CM

## 2015-11-20 DIAGNOSIS — N179 Acute kidney failure, unspecified: Secondary | ICD-10-CM

## 2015-11-20 DIAGNOSIS — J9601 Acute respiratory failure with hypoxia: Secondary | ICD-10-CM

## 2015-11-20 DIAGNOSIS — I10 Essential (primary) hypertension: Secondary | ICD-10-CM

## 2015-11-20 LAB — INFLUENZA PANEL BY PCR (TYPE A & B)
H1N1FLUPCR: NOT DETECTED
INFLAPCR: NEGATIVE
INFLBPCR: NEGATIVE

## 2015-11-20 LAB — LACTIC ACID, PLASMA
LACTIC ACID, VENOUS: 1.2 mmol/L (ref 0.5–2.0)
Lactic Acid, Venous: 1.7 mmol/L (ref 0.5–2.0)
Lactic Acid, Venous: 4 mmol/L (ref 0.5–2.0)

## 2015-11-20 LAB — CREATININE, URINE, RANDOM: CREATININE, URINE: 261.88 mg/dL

## 2015-11-20 LAB — STREP PNEUMONIAE URINARY ANTIGEN: Strep Pneumo Urinary Antigen: NEGATIVE

## 2015-11-20 MED ORDER — ALBUTEROL SULFATE (2.5 MG/3ML) 0.083% IN NEBU
5.0000 mg | INHALATION_SOLUTION | Freq: Three times a day (TID) | RESPIRATORY_TRACT | Status: DC
  Administered 2015-11-21 (×3): 5 mg via RESPIRATORY_TRACT
  Filled 2015-11-20 (×3): qty 6

## 2015-11-20 MED ORDER — SODIUM CHLORIDE 0.9 % IV BOLUS (SEPSIS)
500.0000 mL | Freq: Once | INTRAVENOUS | Status: AC
  Administered 2015-11-20: 500 mL via INTRAVENOUS

## 2015-11-20 MED ORDER — SODIUM CHLORIDE 0.9 % IV BOLUS (SEPSIS)
1500.0000 mL | Freq: Once | INTRAVENOUS | Status: AC
  Administered 2015-11-20: 1500 mL via INTRAVENOUS

## 2015-11-20 MED ORDER — ALBUTEROL SULFATE (2.5 MG/3ML) 0.083% IN NEBU
5.0000 mg | INHALATION_SOLUTION | Freq: Four times a day (QID) | RESPIRATORY_TRACT | Status: DC
  Administered 2015-11-20 (×2): 5 mg via RESPIRATORY_TRACT
  Filled 2015-11-20 (×2): qty 6

## 2015-11-20 MED ORDER — IPRATROPIUM-ALBUTEROL 0.5-2.5 (3) MG/3ML IN SOLN
3.0000 mL | Freq: Four times a day (QID) | RESPIRATORY_TRACT | Status: DC
  Administered 2015-11-20: 3 mL via RESPIRATORY_TRACT
  Filled 2015-11-20: qty 3

## 2015-11-20 MED ORDER — METHYLPREDNISOLONE SODIUM SUCC 125 MG IJ SOLR
60.0000 mg | Freq: Two times a day (BID) | INTRAMUSCULAR | Status: DC
  Administered 2015-11-20 (×2): 60 mg via INTRAVENOUS
  Filled 2015-11-20 (×2): qty 2

## 2015-11-20 MED ORDER — SODIUM CHLORIDE 0.9 % IV SOLN: INTRAVENOUS | Status: AC

## 2015-11-20 NOTE — Progress Notes (Addendum)
UR COMPLETED  

## 2015-11-20 NOTE — Evaluation (Signed)
Physical Therapy Evaluation Patient Details Name: Tamara Russell MRN: [id number] DOB: Jan 20, 1961 Today's Date: 11/20/2015   History of Present Illness  pt is a 55 y/o female with h/o HTN admitted with SOB and cough, sats at 88%.  Clinical Impression  Pt admitted with/for SOB and cough.  Presently pt at a level where whe can benefit from some supplemental oxygen.  Pt currently limited functionally due to the problems listed below.  (see problems list.)  Pt will benefit from PT to maximize function and safety to be able to get home safely with available assist of family.     Follow Up Recommendations No PT follow up (up to 24 hours initially for supervision)    Equipment Recommendations  None recommended by PT    Recommendations for Other Services       Precautions / Restrictions Precautions Precautions: None Restrictions Weight Bearing Restrictions: No      Mobility  Bed Mobility Overal bed mobility: Modified Independent                Transfers Overall transfer level: Modified independent                  Ambulation/Gait Ambulation/Gait assistance: Supervision Ambulation Distance (Feet): 180 Feet Assistive device: None Gait Pattern/deviations: Step-through pattern Gait velocity: slower Gait velocity interpretation: Below normal speed for age/gender General Gait Details: steady "strut" with some notable SOB 2/4  Stairs            Wheelchair Mobility    Modified Rankin (Stroke Patients Only)       Balance Overall balance assessment: Needs assistance Sitting-balance support: No upper extremity supported Sitting balance-Leahy Scale: Good     Standing balance support: No upper extremity supported Standing balance-Leahy Scale: Fair                               Pertinent Vitals/Pain Pain Assessment: No/denies pain    Home Living Family/patient expects to be discharged to:: Private residence Living Arrangements:  Spouse/significant other (2 step kids) Available Help at Discharge: Available PRN/intermittently;Family Type of Home: House Home Access: Stairs to enter Entrance Stairs-Rails:  (post) Secretary/administrator of Steps: 2 Home Layout: One level Home Equipment: None      Prior Function Level of Independence: Independent               Hand Dominance        Extremity/Trunk Assessment   Upper Extremity Assessment: Overall WFL for tasks assessed           Lower Extremity Assessment: Overall WFL for tasks assessed         Communication   Communication: No difficulties  Cognition Arousal/Alertness: Awake/alert Behavior During Therapy: WFL for tasks assessed/performed Overall Cognitive Status: Within Functional Limits for tasks assessed                      General Comments General comments (skin integrity, edema, etc.): On RA 93% and HR 90's; after ambulating 100'  sats 99/91% and 103 bpm , on return ambulation sats dropped to 87/89% at 105 bpm, back to 92% in less than 1 min.    Exercises        Assessment/Plan    PT Assessment Patient needs continued PT services  PT Diagnosis Other (comment) (decreased activity tolerance, low SpO2 on RA)   PT Problem List Decreased activity tolerance;Decreased mobility;Cardiopulmonary status limiting activity  PT  Treatment Interventions Gait training;Stair training;Functional mobility training;Therapeutic activities;Patient/family education   PT Goals (Current goals can be found in the Care Plan section) Acute Rehab PT Goals Patient Stated Goal: get home  without oxygen PT Goal Formulation: With patient Time For Goal Achievement: 11/27/15 Potential to Achieve Goals: Good    Frequency Min 3X/week   Barriers to discharge        Co-evaluation               End of Session   Activity Tolerance: Patient tolerated treatment well Patient left: in bed;with call bell/phone within reach;with family/visitor  present Nurse Communication: Mobility status         Time: 1610-9604 PT Time Calculation (min) (ACUTE ONLY): 20 min   Charges:   PT Evaluation $PT Eval Low Complexity: 1 Procedure     PT G Codes:        Jeffrie Stander, Eliseo Gum 11/20/2015, 4:36 PM  11/20/2015  Marshall Bing, PT [phone]  (pager)

## 2015-11-20 NOTE — Progress Notes (Signed)
[facility] PROGRESS NOTE    Progress Note   Dymon Summerhill [id number]:[id number] DOB: [birth year] DOA: 11/19/2015 PCP: Ambrose Finland, NP   Brief Narrative:   Mykira Hofmeister is an 55 y.o. female former smoker who presents to the ED for cough and shortness of breath started 5 days prior to admission  Assessment/Plan:   Acute respiratory failure with hypoxia (HCC) due to   Acute bronchitis/sepsis: No infiltrates on x-ray evaluation lactic acid was probably due to hypoxia and mild sepsis I agree with empiric antibiotic coverage, steroids and nebulizer. Her blood pressure has remained stable. Influenza PCR negative.  AKI (acute kidney injury) [facility]): Acute kidney injury likely prerenal and medication induced continue IV fluid hydration and recheck a basic metabolic panel in the morning. DC losartan and hydrocodone. Fracture patient of sodium is pending continue IV fluids recheck basic metabolic panel in the morning..  Essential  HTN (hypertension): DC Norflex continue monitor blood pressure.  GERD (gastroesophageal reflux disease): Ranitidine.     DVT Prophylaxis - Lovenox ordered.  Family Communication: none Disposition Plan: Home 2 days Code Status:     Code Status Orders        Start     Ordered   11/19/15 2106  Full code   Continuous     11/19/15 2106    Code Status History    Date Active Date Inactive Code Status Order ID Comments User Context   This patient has a current code status but no historical code status.        IV Access:    Peripheral IV   Procedures and diagnostic studies:   Dg Chest 2 View  11/19/2015  CLINICAL DATA:  Shortness of breath, cough, fever for 4 days, low oxygenation, pneumonia, hypertension, former smoker EXAM: CHEST  2 VIEW COMPARISON:  07/27/2013 FINDINGS: Enlargement of cardiac silhouette. Mediastinal contours and pulmonary vascularity normal. Chronic accentuation of perihilar interstitial markings, unchanged. No  definite acute infiltrate, pleural effusion or pneumothorax. Bones unremarkable. IMPRESSION: Enlargement of cardiac silhouette. Chronic accentuation of perihilar markings without definite acute infiltrate. Electronically Signed   By: Ulyses Southward M.D.   On: 11/19/2015 16:17     Medical Consultants:    None.  Anti-Infectives:   Anti-infectives    Start     Dose/Rate Route Frequency Ordered Stop   11/20/15 2100  levofloxacin (LEVAQUIN) IVPB 750 mg     750 mg 100 mL/hr over 90 Minutes Intravenous Every 24 hours 11/19/15 2116 11/24/15 2059   11/19/15 2115  levofloxacin (LEVAQUIN) IVPB 750 mg  Status:  Discontinued     750 mg 100 mL/hr over 90 Minutes Intravenous Every 24 hours 11/19/15 2106 11/19/15 2116   11/19/15 2030  cefTRIAXone (ROCEPHIN) 1 g in dextrose 5 % 50 mL IVPB     1 g 100 mL/hr over 30 Minutes Intravenous  Once 11/19/15 2016 11/19/15 2146   11/19/15 2030  azithromycin (ZITHROMAX) 500 mg in dextrose 5 % 250 mL IVPB     500 mg 250 mL/hr over 60 Minutes Intravenous  Once 11/19/15 2016 11/19/15 2150      Subjective:    [facility] she relates her breathing is improved continues to cough constantly.  Objective:    Filed Vitals:   11/19/15 2206 11/20/15 0510 11/20/15 0512 11/20/15 0828  BP: 112/67 159/129 121/65   Pulse: 103 79 78   Temp: 98.1 F (36.7 C) 97.8 F (36.6 C)    TempSrc: Oral Oral    Resp:  20     Height:  (1.549 m)     Weight: 100.6 kg (221 lb 12.5 oz)  101.4 kg (223 lb 8.7 oz)   SpO2: 96% 97% 98% 91%    Intake/Output Summary (Last 24 hours) at 11/20/15 0846 Last data filed at 11/20/15 0525  Gross per 24 hour  Intake 1004.17 ml  Output      0 ml  Net 1004.17 ml   Filed Weights   11/19/15 1547 11/19/15 2206 11/20/15 0512  Weight: 100.699 kg (222 lb) 100.6 kg (221 lb 12.5 oz) 101.4 kg (223 lb 8.7 oz)    Exam: Gen:  NAD Cardiovascular:  Regular rate tachycardic Chest and lungs:   Good air movement but diffuse wheezing  bilaterally. Abdomen:  Abdomen soft, NT/ND, + BS Extremities:  No edema   Data Reviewed:    Labs: Basic Metabolic Panel:  Recent Labs Lab 11/19/15 1550  NA 138  K 3.5  CL 89*  CO2 33*  GLUCOSE 114*  BUN 18  CREATININE 1.11*  CALCIUM 9.4   GFR Estimated Creatinine Clearance: 61.1 mL/min (by C-G formula based on Cr of 1.11). Liver Function Tests: No results for input(s): AST, ALT, ALKPHOS, BILITOT, PROT, ALBUMIN in the last 168 hours. No results for input(s): LIPASE, AMYLASE in the last 168 hours. No results for input(s): AMMONIA in the last 168 hours. Coagulation profile  Recent Labs Lab 11/19/15 2155  INR 1.08    CBC:  Recent Labs Lab 11/19/15 1550  WBC 4.8  HGB 15.0  HCT 45.9  MCV 75.4*  PLT 268   Cardiac Enzymes:  Recent Labs Lab 11/19/15 1550  TROPONINI <0.03   BNP (last 3 results) No results for input(s): PROBNP in the last 8760 hours. CBG: No results for input(s): GLUCAP in the last 168 hours. D-Dimer: No results for input(s): DDIMER in the last 72 hours. Hgb A1c: No results for input(s): HGBA1C in the last 72 hours. Lipid Profile: No results for input(s): CHOL, HDL, LDLCALC, TRIG, CHOLHDL, LDLDIRECT in the last 72 hours. Thyroid function studies: No results for input(s): TSH, T4TOTAL, T3FREE, THYROIDAB in the last 72 hours.  Invalid input(s): FREET3 Anemia work up: No results for input(s): VITAMINB12, FOLATE, FERRITIN, TIBC, IRON, RETICCTPCT in the last 72 hours. Sepsis Labs:  Recent Labs Lab 11/19/15 1550 11/19/15 2151 11/19/15 2155 11/20/15 0009 11/20/15 0335 11/20/15 0600  PROCALCITON  --   --  0.14  --   --   --   WBC 4.8  --   --   --   --   --   LATICACIDVEN  --  2.9*  --  4.0* 1.7 1.2   Microbiology No results found for this or any previous visit (from the past 240 hour(s)).   Medications:   . amLODipine  5 mg Oral Daily  . cholecalciferol  1,000 Units Oral Daily  . dextromethorphan-guaiFENesin  1 tablet Oral  BID  . enoxaparin (LOVENOX) injection  40 mg Subcutaneous Q24H  . famotidine  20 mg Oral Daily  . ipratropium-albuterol  3 mL Nebulization QID  . levofloxacin (LEVAQUIN) IV  750 mg Intravenous Q24H  . multivitamin with minerals  1 tablet Oral Daily  . predniSONE  60 mg Oral Q breakfast  . sodium chloride  1,000 mL Intravenous Once   Continuous Infusions: . sodium chloride 75 mL/hr at 11/20/15 0319    Time spent: 25 min   LOS: 1 day   Marinda Elk  [facility] Pager [phone]  *  Please refer to [email or web address], password [email or web address] to get updated schedule on who will round on this patient, as hospitalists switch teams weekly. If 7PM-7AM, please contact night-coverage at [email or web address], password [email or web address] for any overnight needs.  11/20/2015, 8:46 AM

## 2015-11-20 NOTE — Care Management Note (Signed)
Case Management Note  Patient Details  Name: Tamara Russell MRN: [id number] Date of Birth: 08/14/1961  Subjective/Objective:             Admitted with SOB/cough. Hx of HTN. Lives with husband. Independent with ADL's. No DMEusage. PCP: Erick Blinks   Action/Plan: Return to home when medically stable. CM to f/u with disposition needs.  Expected Discharge Date:                  Expected Discharge Plan:  Home/Self Care  In-House Referral:     Discharge planning Services  CM Consult  Post Acute Care Choice:    Choice offered to:     DME Arranged:    DME Agency:     HH Arranged:    HH Agency:     Status of Service:  In process, will continue to follow  Medicare Important Message Given:    Date Medicare IM Given:    Medicare IM give by:    Date Additional Medicare IM Given:    Additional Medicare Important Message give by:     If discussed at Long Length of Stay Meetings, dates discussed:    Additional Comments: AIANA NORDQUIST (Spouse) [phone]  Gae Gallop [place], [place] [phone] 11/20/2015, 4:43 PM

## 2015-11-20 NOTE — Progress Notes (Signed)
CRITICAL VALUE ALERT  Critical value received:  Lactic acid 4.0  Date of notification:  11/20/15  Time of notification:  0055  Critical value read back:Yes.    Nurse who received alert:  Judee Clara    MD notified (1st page):  Claiborne Billings, NP  Time of first page:  0056  MD notified (2nd page):  Time of second page:  Responding MD:  Claiborne Billings, NP  Time MD responded:  0100

## 2015-11-20 NOTE — Progress Notes (Signed)
PT Cancellation Note  Patient Details Name: Tamara Russell MRN: [id number] DOB: 02-Oct-1961   Cancelled Treatment:    Reason Eval/Treat Not Completed: Patient declined, no reason specified.  Pt sleepy and asked therapist to come back.  Will try back if able otherwise will see as able 2/5. 11/20/2015  [place] Bing, PT [phone]  (pager)   Endora Teresi, Eliseo Gum 11/20/2015, 12:48 PM

## 2015-11-21 LAB — BASIC METABOLIC PANEL
ANION GAP: 9 (ref 5–15)
BUN: 14 mg/dL (ref 6–20)
CHLORIDE: 94 mmol/L — AB (ref 101–111)
CO2: 33 mmol/L — AB (ref 22–32)
Calcium: 8.5 mg/dL — ABNORMAL LOW (ref 8.9–10.3)
Creatinine, Ser: 0.72 mg/dL (ref 0.44–1.00)
GFR calc Af Amer: >60 mL/min (ref >60)
GLUCOSE: 189 mg/dL — AB (ref 65–99)
POTASSIUM: 4.3 mmol/L (ref 3.5–5.1)
Sodium: 136 mmol/L (ref 135–145)

## 2015-11-21 LAB — UREA NITROGEN, URINE: Urea Nitrogen, Ur: 470 mg/dL

## 2015-11-21 LAB — HIV ANTIBODY (ROUTINE TESTING W REFLEX): HIV SCREEN 4TH GENERATION: NONREACTIVE

## 2015-11-21 MED ORDER — LEVOFLOXACIN 500 MG PO TABS
500.0000 mg | ORAL_TABLET | Freq: Every day | ORAL | Status: DC
  Administered 2015-11-21 – 2015-11-23 (×3): 500 mg via ORAL
  Filled 2015-11-21 (×3): qty 1

## 2015-11-21 MED ORDER — ACETAMINOPHEN 325 MG PO TABS
325.0000 mg | ORAL_TABLET | Freq: Four times a day (QID) | ORAL | Status: DC | PRN
  Administered 2015-11-21 – 2015-11-23 (×4): 325 mg via ORAL
  Filled 2015-11-21 (×4): qty 1

## 2015-11-21 MED ORDER — PREDNISONE 10 MG PO TABS
10.0000 mg | ORAL_TABLET | Freq: Every day | ORAL | Status: DC
  Administered 2015-11-21 – 2015-11-23 (×3): 10 mg via ORAL
  Filled 2015-11-21 (×2): qty 1

## 2015-11-21 MED ORDER — ALBUTEROL SULFATE (2.5 MG/3ML) 0.083% IN NEBU
5.0000 mg | INHALATION_SOLUTION | Freq: Two times a day (BID) | RESPIRATORY_TRACT | Status: DC
  Administered 2015-11-22 – 2015-11-23 (×3): 5 mg via RESPIRATORY_TRACT
  Filled 2015-11-21 (×3): qty 6

## 2015-11-21 NOTE — Progress Notes (Signed)
[facility] PROGRESS NOTE    Progress Note   Brittley Regner ZOX:[id number] DOB: 16-Jun-1961 DOA: 11/19/2015 PCP: Ambrose Finland, NP   Brief Narrative:   Tamara Russell is an 55 y.o. female former smoker who presents to the ED for cough and shortness of breath started 5 days prior to admission  Assessment/Plan:   Acute respiratory failure with hypoxia (HCC) due to   Acute bronchitis/sepsis: She has remained afebrile with no leukocytosis, her saturations have improved. She continues to have wheezing bilaterally. But she relates she feels better. We'll change her IV steroids and antibiotics to oral check oxygen saturations with ambulation in the morning.  AKI (acute kidney injury) [facility]): Acute kidney injury likely prerenal and medication induced, creatinine back to baseline. Likely prerenal continue to hold ACE inhibitor.  Essential  HTN (hypertension): DC Norvasc continue monitor blood pressure.  GERD (gastroesophageal reflux disease): Ranitidine.     DVT Prophylaxis - Lovenox ordered.  Family Communication: none Disposition Plan: Home 2 days Code Status:     Code Status Orders        Start     Ordered   11/19/15 2106  Full code   Continuous     11/19/15 2106    Code Status History    Date Active Date Inactive Code Status Order ID Comments User Context   This patient has a current code status but no historical code status.        IV Access:    Peripheral IV   Procedures and diagnostic studies:   Dg Chest 2 View  11/19/2015  CLINICAL DATA:  Shortness of breath, cough, fever for 4 days, low oxygenation, pneumonia, hypertension, former smoker EXAM: CHEST  2 VIEW COMPARISON:  07/27/2013 FINDINGS: Enlargement of cardiac silhouette. Mediastinal contours and pulmonary vascularity normal. Chronic accentuation of perihilar interstitial markings, unchanged. No definite acute infiltrate, pleural effusion or pneumothorax. Bones unremarkable. IMPRESSION:  Enlargement of cardiac silhouette. Chronic accentuation of perihilar markings without definite acute infiltrate. Electronically Signed   By: Ulyses Southward M.D.   On: 11/19/2015 16:17     Medical Consultants:    None.  Anti-Infectives:   Anti-infectives    Start     Dose/Rate Route Frequency Ordered Stop   11/20/15 2100  levofloxacin (LEVAQUIN) IVPB 750 mg     750 mg 100 mL/hr over 90 Minutes Intravenous Every 24 hours 11/19/15 2116 11/24/15 2059   11/19/15 2115  levofloxacin (LEVAQUIN) IVPB 750 mg  Status:  Discontinued     750 mg 100 mL/hr over 90 Minutes Intravenous Every 24 hours 11/19/15 2106 11/19/15 2116   11/19/15 2030  cefTRIAXone (ROCEPHIN) 1 g in dextrose 5 % 50 mL IVPB     1 g 100 mL/hr over 30 Minutes Intravenous  Once 11/19/15 2016 11/19/15 2146   11/19/15 2030  azithromycin (ZITHROMAX) 500 mg in dextrose 5 % 250 mL IVPB     500 mg 250 mL/hr over 60 Minutes Intravenous  Once 11/19/15 2016 11/19/15 2150      Subjective:    [employer] she relates her breathing is improved continues to cough constantly.  Objective:    Filed Vitals:   11/20/15 2020 11/20/15 2134 11/21/15 0457 11/21/15 0832  BP:  127/70 139/87   Pulse:  93 73   Temp:  98.1 F (36.7 C) 97.6 F (36.4 C)   TempSrc:  Oral Oral   Resp:  20 20   Height:      Weight:  SpO2: 98% 99% 99% 98%    Intake/Output Summary (Last 24 hours) at 11/21/15 0903 Last data filed at 11/21/15 0700  Gross per 24 hour  Intake      0 ml  Output      0 ml  Net      0 ml   Filed Weights   11/19/15 1547 11/19/15 2206 11/20/15 0512  Weight: 100.699 kg (222 lb) 100.6 kg (221 lb 12.5 oz) 101.4 kg (223 lb 8.7 oz)    Exam: Gen:  NAD Cardiovascular:  Regular rate tachycardic Chest and lungs:   Good air movement but diffuse wheezing bilaterally. Abdomen:  Abdomen soft, NT/ND, + BS Extremities:  No edema   Data Reviewed:    Labs: Basic Metabolic Panel:  Recent Labs Lab 11/19/15 1550  11/21/15 0520  NA 138 136  K 3.5 4.3  CL 89* 94*  CO2 33* 33*  GLUCOSE 114* 189*  BUN 18 14  CREATININE 1.11* 0.72  CALCIUM 9.4 8.5*   GFR Estimated Creatinine Clearance: 84.8 mL/min (by C-G formula based on Cr of 0.72). Liver Function Tests: No results for input(s): AST, ALT, ALKPHOS, BILITOT, PROT, ALBUMIN in the last 168 hours. No results for input(s): LIPASE, AMYLASE in the last 168 hours. No results for input(s): AMMONIA in the last 168 hours. Coagulation profile  Recent Labs Lab 11/19/15 2155  INR 1.08    CBC:  Recent Labs Lab 11/19/15 1550  WBC 4.8  HGB 15.0  HCT 45.9  MCV 75.4*  PLT 268   Cardiac Enzymes:  Recent Labs Lab 11/19/15 1550  TROPONINI <0.03   BNP (last 3 results) No results for input(s): PROBNP in the last 8760 hours. CBG: No results for input(s): GLUCAP in the last 168 hours. D-Dimer: No results for input(s): DDIMER in the last 72 hours. Hgb A1c: No results for input(s): HGBA1C in the last 72 hours. Lipid Profile: No results for input(s): CHOL, HDL, LDLCALC, TRIG, CHOLHDL, LDLDIRECT in the last 72 hours. Thyroid function studies: No results for input(s): TSH, T4TOTAL, T3FREE, THYROIDAB in the last 72 hours.  Invalid input(s): FREET3 Anemia work up: No results for input(s): VITAMINB12, FOLATE, FERRITIN, TIBC, IRON, RETICCTPCT in the last 72 hours. Sepsis Labs:  Recent Labs Lab 11/19/15 1550 11/19/15 2151 11/19/15 2155 11/20/15 0009 11/20/15 0335 11/20/15 0600  PROCALCITON  --   --  0.14  --   --   --   WBC 4.8  --   --   --   --   --   LATICACIDVEN  --  2.9*  --  4.0* 1.7 1.2   Microbiology Recent Results (from the past 240 hour(s))  Culture, blood (routine x 2) Call MD if unable to obtain prior to antibiotics being given     Status: None (Preliminary result)   Collection Time: 11/19/15  9:20 PM  Result Value Ref Range Status   Specimen Description BLOOD RIGHT ANTECUBITAL  Final   Special Requests IN PEDIATRIC BOTTLE  3CC  Final   Culture NO GROWTH < 24 HOURS  Final   Report Status PENDING  Incomplete  Culture, blood (routine x 2) Call MD if unable to obtain prior to antibiotics being given     Status: None (Preliminary result)   Collection Time: 11/19/15  9:25 PM  Result Value Ref Range Status   Specimen Description BLOOD RIGHT HAND  Final   Special Requests IN PEDIATRIC BOTTLE 3CC  Final   Culture NO GROWTH < 24 HOURS  Final  Report Status PENDING  Incomplete     Medications:   . albuterol  5 mg Nebulization TID  . cholecalciferol  1,000 Units Oral Daily  . dextromethorphan-guaiFENesin  1 tablet Oral BID  . enoxaparin (LOVENOX) injection  40 mg Subcutaneous Q24H  . famotidine  20 mg Oral Daily  . levofloxacin (LEVAQUIN) IV  750 mg Intravenous Q24H  . methylPREDNISolone (SOLU-MEDROL) injection  60 mg Intravenous Q12H  . multivitamin with minerals  1 tablet Oral Daily  . sodium chloride  1,000 mL Intravenous Once   Continuous Infusions:    Time spent: 15 min   LOS: 2 days   Marinda Elk  [facility] Pager [phone]  *Please refer to [email or web address], password [email or web address] to get updated schedule on who will round on this patient, as hospitalists switch teams weekly. If 7PM-7AM, please contact night-coverage at [email or web address], password [email or web address] for any overnight needs.  11/21/2015, 9:03 AM

## 2015-11-22 DIAGNOSIS — K219 Gastro-esophageal reflux disease without esophagitis: Secondary | ICD-10-CM

## 2015-11-22 LAB — BASIC METABOLIC PANEL
ANION GAP: 8 (ref 5–15)
BUN: 11 mg/dL (ref 6–20)
CO2: 36 mmol/L — AB (ref 22–32)
Calcium: 8.7 mg/dL — ABNORMAL LOW (ref 8.9–10.3)
Chloride: 95 mmol/L — ABNORMAL LOW (ref 101–111)
Creatinine, Ser: 0.58 mg/dL (ref 0.44–1.00)
GFR calc Af Amer: >60 mL/min (ref >60)
GFR calc non Af Amer: >60 mL/min (ref >60)
GLUCOSE: 95 mg/dL (ref 65–99)
POTASSIUM: 4 mmol/L (ref 3.5–5.1)
Sodium: 139 mmol/L (ref 135–145)

## 2015-11-22 LAB — LEGIONELLA ANTIGEN, URINE

## 2015-11-22 MED ORDER — TRAMADOL HCL 50 MG PO TABS
50.0000 mg | ORAL_TABLET | Freq: Once | ORAL | Status: DC
  Filled 2015-11-22: qty 1

## 2015-11-22 MED ORDER — SODIUM CHLORIDE 0.9 % IV BOLUS (SEPSIS)
1000.0000 mL | Freq: Once | INTRAVENOUS | Status: AC
  Administered 2015-11-22: 1000 mL via INTRAVENOUS

## 2015-11-22 MED ORDER — SODIUM CHLORIDE 0.9 % IV SOLN
INTRAVENOUS | Status: DC
Start: 2015-11-22 — End: 2015-11-22

## 2015-11-22 MED ORDER — PREDNISONE 10 MG PO TABS: ORAL_TABLET | ORAL | Status: DC

## 2015-11-22 MED ORDER — TRAMADOL HCL 50 MG PO TABS
100.0000 mg | ORAL_TABLET | Freq: Two times a day (BID) | ORAL | Status: DC
  Filled 2015-11-22 (×3): qty 2

## 2015-11-22 MED ORDER — SODIUM CHLORIDE 0.9 % IV SOLN
INTRAVENOUS | Status: DC
  Administered 2015-11-22 – 2015-11-23 (×2): via INTRAVENOUS

## 2015-11-22 MED ORDER — AMLODIPINE BESYLATE 5 MG PO TABS
5.0000 mg | ORAL_TABLET | Freq: Every day | ORAL | Status: DC
  Administered 2015-11-22 – 2015-11-23 (×2): 5 mg via ORAL
  Filled 2015-11-22 (×2): qty 1

## 2015-11-22 MED ORDER — LEVOFLOXACIN 500 MG PO TABS: 500.0000 mg | ORAL_TABLET | Freq: Every day | ORAL | Status: DC

## 2015-11-22 NOTE — Discharge Summary (Signed)
Physician Discharge Summary  Tamara Russell [place] ZOX:[id number] DOB: 1960-11-19 DOA: 11/19/2015  PCP: Ambrose Finland, NP  Admit date: 11/19/2015 Discharge date: 11/22/2015  Time spent: 35 minutes  Recommendations for Outpatient Follow-up:  1. Follow-up with primary care doctor in 2 weeks. Check blood pressure and titrate antihypertensive medications as needed.   Discharge Diagnoses:  Principal Problem:   Acute respiratory failure with hypoxia (HCC) Active Problems:   HTN (hypertension)   GERD (gastroesophageal reflux disease)   AKI (acute kidney injury) (HCC)   Acute bronchitis   Discharge Condition: stable  Diet recommendation: regular  Filed Weights   11/19/15 1547 11/19/15 2206 11/20/15 0512  Weight: 100.699 kg (222 lb) 100.6 kg (221 lb 12.5 oz) 101.4 kg (223 lb 8.7 oz)    History of present illness:  55 year old smoker who quit about 10 years ago presents to the ED with cough and shortness of breath that started 5 days prior to admission  Hospital Course:  Acute respiratory failure with hypoxia due to acute bronchitis/sepsis: On admission she was started on the sepsis pathway, was started empirically on IV antibiotics and steroids. Once her saturations were improved with ambulation she was changed to oral Levaquin and oral steroids we she will continue as an outpatient. Continue Levaquin for 5 days, and a 7 day steroid course tapered.   Acute kidney injury likely prerenal does resolve with holding diuretics and ACE inhibitor.  Essential hypertension: Her medications were held on admission these will be resumed as an outpatient.  Procedures:  CXR  Consultations:  none  Discharge Exam: Filed Vitals:   11/22/15 0534 11/22/15 0847  BP: 135/78 138/85  Pulse: 72   Temp: 97.9 F (36.6 C)   Resp: 18     General: A&O x3 Cardiovascular: RRR Respiratory: good air movement CTA B/L  Discharge Instructions   Discharge Instructions    Diet - low sodium heart  healthy    Complete by:  As directed      Increase activity slowly    Complete by:  As directed           Current Discharge Medication List    START taking these medications   Details  levofloxacin (LEVAQUIN) 500 MG tablet Take 1 tablet (500 mg total) by mouth daily. Qty: 4 tablet, Refills: 0      CONTINUE these medications which have CHANGED   Details  predniSONE (DELTASONE) 10 MG tablet Takes 6 tablets for 1 days, then 5 tablets for 1 days, then 4 tablets for 1 days, then 3 tablets for 1 days, then 2 tabs for 1 days, then 1 tab for 1 days, and then stop. Qty: 21 tablet, Refills: 0      CONTINUE these medications which have NOT CHANGED   Details  acetaminophen (TYLENOL) 500 MG tablet Take 1,000 mg by mouth every 6 (six) hours as needed for moderate pain.    amLODipine (NORVASC) 5 MG tablet Take 1 tablet (5 mg total) by mouth daily. Qty: 30 tablet, Refills: 5   Associated Diagnoses: Essential hypertension    cholecalciferol (VITAMIN D) 1000 UNITS tablet Take 1,000 Units by mouth daily.    losartan-hydrochlorothiazide (HYZAAR) 100-25 MG tablet Take 1 tablet by mouth daily. Qty: 30 tablet, Refills: 5   Associated Diagnoses: Essential hypertension    Menthol (HALLS COUGH DROPS MT) Use as directed 1 lozenge in the mouth or throat every 3 (three) hours as needed (for sore throat).    Multiple Vitamins-Minerals (CENTRUM SILVER ADULT 50+ PO)  Take 1 tablet by mouth daily.    cyclobenzaprine (FLEXERIL) 10 MG tablet Take 1 tablet (10 mg total) by mouth 2 (two) times daily as needed for muscle spasms. Qty: 20 tablet, Refills: 0    potassium chloride (K-DUR) 10 MEQ tablet Take 1 tablet (10 mEq total) by mouth daily. Qty: 5 tablet, Refills: 0   Associated Diagnoses: Hypokalemia    ranitidine (ZANTAC) 150 MG tablet Take 1 tablet (150 mg total) by mouth 2 (two) times daily. Qty: 30 tablet, Refills: 2       Allergies  Allergen Reactions  . Penicillins Other (See Comments)     Childhood allergy   Follow-up Information    Follow up with Ambrose Finland, NP In 2 weeks.   Specialty:  Internal Medicine   Contact information:   [address] [place] [address] [phone]        The results of significant diagnostics from this hospitalization (including imaging, microbiology, ancillary and laboratory) are listed below for reference.    Significant Diagnostic Studies: Dg Chest 2 View  11/19/2015  CLINICAL DATA:  Shortness of breath, cough, fever for 4 days, low oxygenation, pneumonia, hypertension, former smoker EXAM: CHEST  2 VIEW COMPARISON:  07/27/2013 FINDINGS: Enlargement of cardiac silhouette. Mediastinal contours and pulmonary vascularity normal. Chronic accentuation of perihilar interstitial markings, unchanged. No definite acute infiltrate, pleural effusion or pneumothorax. Bones unremarkable. IMPRESSION: Enlargement of cardiac silhouette. Chronic accentuation of perihilar markings without definite acute infiltrate. Electronically Signed   By: Ulyses Southward M.D.   On: 11/19/2015 16:17    Microbiology: Recent Results (from the past 240 hour(s))  Culture, blood (routine x 2) Call MD if unable to obtain prior to antibiotics being given     Status: None (Preliminary result)   Collection Time: 11/19/15  9:20 PM  Result Value Ref Range Status   Specimen Description BLOOD RIGHT ANTECUBITAL  Final   Special Requests IN PEDIATRIC BOTTLE 3CC  Final   Culture NO GROWTH 2 DAYS  Final   Report Status PENDING  Incomplete  Culture, blood (routine x 2) Call MD if unable to obtain prior to antibiotics being given     Status: None (Preliminary result)   Collection Time: 11/19/15  9:25 PM  Result Value Ref Range Status   Specimen Description BLOOD RIGHT HAND  Final   Special Requests IN PEDIATRIC BOTTLE 3CC  Final   Culture NO GROWTH 2 DAYS  Final   Report Status PENDING  Incomplete     Labs: Basic Metabolic Panel:  Recent Labs Lab 11/19/15 1550  11/21/15 0520 11/22/15 0808  NA 138 136 139  K 3.5 4.3 4.0  CL 89* 94* 95*  CO2 33* 33* 36*  GLUCOSE 114* 189* 95  BUN 18 14 11   CREATININE 1.11* 0.72 0.58  CALCIUM 9.4 8.5* 8.7*   Liver Function Tests: No results for input(s): AST, ALT, ALKPHOS, BILITOT, PROT, ALBUMIN in the last 168 hours. No results for input(s): LIPASE, AMYLASE in the last 168 hours. No results for input(s): AMMONIA in the last 168 hours. CBC:  Recent Labs Lab 11/19/15 1550  WBC 4.8  HGB 15.0  HCT 45.9  MCV 75.4*  PLT 268   Cardiac Enzymes:  Recent Labs Lab 11/19/15 1550  TROPONINI <0.03   BNP: BNP (last 3 results)  Recent Labs  11/19/15 1550  BNP 12.9    ProBNP (last 3 results) No results for input(s): PROBNP in the last 8760 hours.  CBG: No results for  input(s): GLUCAP in the last 168 hours.     Signed:  Marinda Elk MD.  [facility] 11/22/2015, 11:25 AM

## 2015-11-22 NOTE — Progress Notes (Signed)
Physical Therapy Treatment Patient Details Name: Tamara Russell MRN: [id number] DOB: [birth year] Today's Date: 12/18/2015    History of Present Illness pt is a 55 y/o female with h/o HTN admitted with SOB and cough, sats at 88%.    PT Comments    Pt performed increased gait distance with stable O2 sats on 2L.  Pt performed stair training x 2 stairs with cues for safety.  Pt performed with fatigue, family and patient educated on continued mobility at home.    Follow Up Recommendations  No PT follow up     Equipment Recommendations       Recommendations for Other Services       Precautions / Restrictions Precautions Precautions: None    Mobility  Bed Mobility Overal bed mobility: Modified Independent                Transfers Overall transfer level: Modified independent                  Ambulation/Gait Ambulation/Gait assistance: Supervision Ambulation Distance (Feet): 150 Feet (x2 trials) Assistive device: None (Pt would benefit from RW for energy conservation.) Gait Pattern/deviations: Drifts right/left;Shuffle;Wide base of support     General Gait Details: SHOB remains with posterior placement of shoulder with forward hips.  pt reports chronic back issues.     Stairs Stairs: Yes Stairs assistance: Supervision Stair Management: One rail Left Number of Stairs: 2 General stair comments: Required cues for sequencing, demonstrated non-reciprocal pattern.  Pt fatigued post trial.   Wheelchair Mobility    Modified Rankin (Stroke Patients Only)       Balance Overall balance assessment: Needs assistance   Sitting balance-Leahy Scale: Good       Standing balance-Leahy Scale: Fair                      Cognition Arousal/Alertness: Awake/alert Behavior During Therapy: WFL for tasks assessed/performed Overall Cognitive Status: Within Functional Limits for tasks assessed                      Exercises      General  Comments        Pertinent Vitals/Pain Pain Assessment: No/denies pain    Home Living                      Prior Function            PT Goals (current goals can now be found in the care plan section) Acute Rehab PT Goals Patient Stated Goal: get home  without oxygen Potential to Achieve Goals: Good Progress towards PT goals: Progressing toward goals    Frequency  Min 3X/week    PT Plan      Co-evaluation             End of Session Equipment Utilized During Treatment: Gait belt Activity Tolerance: Patient tolerated treatment well Patient left: in bed;with call bell/phone within reach;with family/visitor present;with bed alarm set     Time: 4098-1191 PT Time Calculation (min) (ACUTE ONLY): 21 min  Charges:  $Gait Training: 8-22 mins                    G Codes:      Florestine Avers 18-Dec-2015, 4:12 PM  Joycelyn Rua, PTA pager [phone]

## 2015-11-22 NOTE — Progress Notes (Signed)
SATURATION QUALIFICATIONS: (This note is used to comply with regulatory documentation for home oxygen)  Patient Saturations on Room Air at Rest = 95%  Patient Saturations on Room Air while Ambulating = 86%  Patient Saturations on 2 Liters of oxygen while Ambulating = 93%  Please briefly explain why patient needs home oxygen: desats on exertion

## 2015-11-22 NOTE — Progress Notes (Signed)
Pt c/o headache checked BP 119/59, P 72 gave pt tylenol at 2330 pt said it did not work and now having a worse headache, on call practitioner was paged about pt complain, ordered tramadol  PO, I pulled it from pyxis to give to pt but she refused, pt said she doesn't want to take another med for the headache, i will continue to monitor

## 2015-11-23 LAB — BASIC METABOLIC PANEL
Anion gap: 10 (ref 5–15)
BUN: 7 mg/dL (ref 6–20)
CALCIUM: 8.6 mg/dL — AB (ref 8.9–10.3)
CHLORIDE: 96 mmol/L — AB (ref 101–111)
CO2: 35 mmol/L — ABNORMAL HIGH (ref 22–32)
CREATININE: 0.58 mg/dL (ref 0.44–1.00)
GFR calc Af Amer: >60 mL/min (ref >60)
Glucose, Bld: 114 mg/dL — ABNORMAL HIGH (ref 65–99)
Potassium: 4 mmol/L (ref 3.5–5.1)
SODIUM: 141 mmol/L (ref 135–145)

## 2015-11-23 NOTE — Progress Notes (Signed)
   11/23/15 1034  PT Visit Information  Reason Eval/Treat Not Completed Patient declined, no reason specified (Pt reports she is set for d/c.  PTA educated pt on mobility and pt reports no concerns.)

## 2015-11-23 NOTE — Progress Notes (Signed)
D/C to home via w/c voices no c/o.All belongings and d/c instructions given

## 2015-11-23 NOTE — Progress Notes (Signed)
SATURATION QUALIFICATIONS: (This note is used to comply with regulatory documentation for home oxygen)  Patient Saturations on Room Air at Rest =91%  Patient Saturations on Room Air while Ambulating = 97%  Patient Saturations on Liters of oxygen while Ambulating =  %  Please briefly explain why patient needs home oxygen:

## 2015-11-23 NOTE — Progress Notes (Signed)
SATURATION QUALIFICATIONS: (This note is used to comply with regulatory documentation for home oxygen)  Patient Saturations on Room Air at Rest = 92%  Patient Saturations on Room Air while Ambulating = 88-94%  Patient Saturations on 1 Liters of oxygen while Sitting = 95%  Please briefly explain why patient needs home oxygen:

## 2015-11-23 NOTE — Progress Notes (Signed)
[facility] PROGRESS NOTE    Progress Note   Tamara Russell UJW:[id number] DOB: [birth year] DOA: 11/19/2015 PCP: Ambrose Finland, NP   Brief Narrative:   Tamara Russell is an 55 y.o. female former smoker who presents to the ED for cough and shortness of breath started 5 days prior to admission  Assessment/Plan:   Acute respiratory failure with hypoxia (HCC) due to   Acute bronchitis/sepsis: She has remained afebrile with no leukocytosis, she was ambulated and her saturations remained greater than 88% on room air. She'll go home on steroids and antibiotics orally.  AKI (acute kidney injury) [facility]): Acute kidney injury likely prerenal and medication induced, creatinine back to baseline.  Essential  HTN (hypertension): DC Norvasc continue monitor blood pressure.  GERD (gastroesophageal reflux disease): Ranitidine.     DVT Prophylaxis - Lovenox ordered.  Family Communication: none Disposition Plan: Home today Code Status:     Code Status Orders        Start     Ordered   11/19/15 2106  Full code   Continuous     11/19/15 2106    Code Status History    Date Active Date Inactive Code Status Order ID Comments User Context   This patient has a current code status but no historical code status.        IV Access:    Peripheral IV   Procedures and diagnostic studies:   No results found.   Medical Consultants:    None.  Anti-Infectives:   Anti-infectives    Start     Dose/Rate Route Frequency Ordered Stop   11/22/15 0000  levofloxacin (LEVAQUIN) 500 MG tablet     500 mg Oral Daily 11/22/15 1123     11/21/15 1000  levofloxacin (LEVAQUIN) tablet 500 mg     500 mg Oral Daily 11/21/15 0909     11/20/15 2100  levofloxacin (LEVAQUIN) IVPB 750 mg  Status:  Discontinued     750 mg 100 mL/hr over 90 Minutes Intravenous Every 24 hours 11/19/15 2116 11/21/15 0909   11/19/15 2115  levofloxacin (LEVAQUIN) IVPB 750 mg  Status:  Discontinued     750  mg 100 mL/hr over 90 Minutes Intravenous Every 24 hours 11/19/15 2106 11/19/15 2116   11/19/15 2030  cefTRIAXone (ROCEPHIN) 1 g in dextrose 5 % 50 mL IVPB     1 g 100 mL/hr over 30 Minutes Intravenous  Once 11/19/15 2016 11/19/15 2146   11/19/15 2030  azithromycin (ZITHROMAX) 500 mg in dextrose 5 % 250 mL IVPB     500 mg 250 mL/hr over 60 Minutes Intravenous  Once 11/19/15 2016 11/19/15 2150      Subjective:    Tamara Russell no complaints feels great.  Objective:    Filed Vitals:   11/23/15 0556 11/23/15 0557 11/23/15 0939 11/23/15 0943  BP:   136/75   Pulse:      Temp:      TempSrc:      Resp:   20   Height:      Weight:      SpO2: 92% 95% 89% 95%    Intake/Output Summary (Last 24 hours) at 11/23/15 1050 Last data filed at 11/23/15 0323  Gross per 24 hour  Intake 1536.25 ml  Output    400 ml  Net 1136.25 ml   Filed Weights   11/19/15 1547 11/19/15 2206 11/20/15 0512  Weight: 100.699 kg (222 lb) 100.6 kg (221 lb 12.5 oz) 101.4 kg (223  lb 8.7 oz)    Exam: Gen:  NAD Cardiovascular:  Regular rate tachycardic Chest and lungs:   Good air movement but diffuse wheezing bilaterally. Abdomen:  Abdomen soft, NT/ND, + BS Extremities:  No edema   Data Reviewed:    Labs: Basic Metabolic Panel:  Recent Labs Lab 11/19/15 1550 11/21/15 0520 11/22/15 0808 11/23/15 0636  NA 138 136 139 141  K 3.5 4.3 4.0 4.0  CL 89* 94* 95* 96*  CO2 33* 33* 36* 35*  GLUCOSE 114* 189* 95 114*  BUN CREATININE 1.11* 0.72 0.58 0.58  CALCIUM 9.4 8.5* 8.7* 8.6*   GFR Estimated Creatinine Clearance: 84.8 mL/min (by C-G formula based on Cr of 0.58). Liver Function Tests: No results for input(s): AST, ALT, ALKPHOS, BILITOT, PROT, ALBUMIN in the last 168 hours. No results for input(s): LIPASE, AMYLASE in the last 168 hours. No results for input(s): AMMONIA in the last 168 hours. Coagulation profile  Recent Labs Lab 11/19/15 2155  INR 1.08    CBC:  Recent  Labs Lab 11/19/15 1550  WBC 4.8  HGB 15.0  HCT 45.9  MCV 75.4*  PLT 268   Cardiac Enzymes:  Recent Labs Lab 11/19/15 1550  TROPONINI <0.03   BNP (last 3 results) No results for input(s): PROBNP in the last 8760 hours. CBG: No results for input(s): GLUCAP in the last 168 hours. D-Dimer: No results for input(s): DDIMER in the last 72 hours. Hgb A1c: No results for input(s): HGBA1C in the last 72 hours. Lipid Profile: No results for input(s): CHOL, HDL, LDLCALC, TRIG, CHOLHDL, LDLDIRECT in the last 72 hours. Thyroid function studies: No results for input(s): TSH, T4TOTAL, T3FREE, THYROIDAB in the last 72 hours.  Invalid input(s): FREET3 Anemia work up: No results for input(s): VITAMINB12, FOLATE, FERRITIN, TIBC, IRON, RETICCTPCT in the last 72 hours. Sepsis Labs:  Recent Labs Lab 11/19/15 1550 11/19/15 2151 11/19/15 2155 11/20/15 0009 11/20/15 0335 11/20/15 0600  PROCALCITON  --   --  0.14  --   --   --   WBC 4.8  --   --   --   --   --   LATICACIDVEN  --  2.9*  --  4.0* 1.7 1.2   Microbiology Recent Results (from the past 240 hour(s))  Culture, blood (routine x 2) Call MD if unable to obtain prior to antibiotics being given     Status: None (Preliminary result)   Collection Time: 11/19/15  9:20 PM  Result Value Ref Range Status   Specimen Description BLOOD RIGHT ANTECUBITAL  Final   Special Requests IN PEDIATRIC BOTTLE 3CC  Final   Culture NO GROWTH 3 DAYS  Final   Report Status PENDING  Incomplete  Culture, blood (routine x 2) Call MD if unable to obtain prior to antibiotics being given     Status: None (Preliminary result)   Collection Time: 11/19/15  9:25 PM  Result Value Ref Range Status   Specimen Description BLOOD RIGHT HAND  Final   Special Requests IN PEDIATRIC BOTTLE 3CC  Final   Culture NO GROWTH 3 DAYS  Final   Report Status PENDING  Incomplete     Medications:   . albuterol  5 mg Nebulization BID  . amLODipine  5 mg Oral Daily  .  cholecalciferol  1,000 Units Oral Daily  . dextromethorphan-guaiFENesin  1 tablet Oral BID  . enoxaparin (LOVENOX) injection  40 mg Subcutaneous Q24H  . famotidine  20 mg Oral Daily  .  levofloxacin  500 mg Oral Daily  . multivitamin with minerals  1 tablet Oral Daily  . predniSONE  10 mg Oral Q breakfast  . traMADol  100 mg Oral Q12H   Continuous Infusions: . sodium chloride 75 mL/hr at 11/23/15 0425    Time spent: 15 min   LOS: 4 days   Marinda Elk  [facility] Pager [phone]  *Please refer to [email or web address], password [email or web address] to get updated schedule on who will round on this patient, as hospitalists switch teams weekly. If 7PM-7AM, please contact night-coverage at [email or web address], password [email or web address] for any overnight needs.  11/23/2015, 10:50 AM

## 2015-11-24 LAB — CULTURE, BLOOD (ROUTINE X 2)
CULTURE: NO GROWTH
Culture: NO GROWTH

## 2015-11-25 MED FILL — AMLODIPINE BESYLATE 5 MG TA: 5 | 30 days supply | Qty: 30 | Fill #2 | Status: TO

## 2015-11-25 MED FILL — LOSARTAN-HCTZ 100-25 MG TAB: 100-25 | 30 days supply | Qty: 30 | Fill #2

## 2015-11-29 ENCOUNTER — Encounter: Payer: Self-pay | Admitting: Internal Medicine

## 2015-11-29 ENCOUNTER — Ambulatory Visit: Payer: BLUE CROSS/BLUE SHIELD | Attending: Internal Medicine | Admitting: Internal Medicine

## 2015-11-29 VITALS — BP 145/92 | HR 76 | Temp 98.0°F | Resp 16 | Ht 61.0 in | Wt 225.0 lb

## 2015-11-29 DIAGNOSIS — J209 Acute bronchitis, unspecified: Secondary | ICD-10-CM

## 2015-11-29 NOTE — Progress Notes (Signed)
Patient ID: Tamara Russell, female   DOB: 1961-08-18, 55 y.o.   MRN: [id number]  CC: HFU  HPI: Tamara Russell is a 55 y.o. female here today for a hospital follow up visit.  Patient has past medical history of hypertension and acid reflux. Patient reports that she was admitted on February 3 to February 6 for pneumonia. Patient was discharged with Levaquin which she will take her last dose today. Patient reports that she feels much better with resolution of cough. Patient has no complaints today.   Allergies  Allergen Reactions  . Penicillins Other (See Comments)    Childhood allergy   Past Medical History  Diagnosis Date  . Hypertension   . Acid reflux    Current Outpatient Prescriptions on File Prior to Visit  Medication Sig Dispense Refill  . acetaminophen (TYLENOL) 500 MG tablet Take 1,000 mg by mouth every 6 (six) hours as needed for moderate pain.    Marland Kitchen amLODipine (NORVASC) 5 MG tablet Take 1 tablet (5 mg total) by mouth daily. 30 tablet 5  . cholecalciferol (VITAMIN D) 1000 UNITS tablet Take 1,000 Units by mouth daily.    Marland Kitchen losartan-hydrochlorothiazide (HYZAAR) 100-25 MG tablet Take 1 tablet by mouth daily. 30 tablet 5  . Multiple Vitamins-Minerals (CENTRUM SILVER ADULT 50+ PO) Take 1 tablet by mouth daily.    . ranitidine (ZANTAC) 150 MG tablet Take 1 tablet (150 mg total) by mouth 2 (two) times daily. 30 tablet 2  . cyclobenzaprine (FLEXERIL) 10 MG tablet Take 1 tablet (10 mg total) by mouth 2 (two) times daily as needed for muscle spasms. (Patient not taking: Reported on 04/01/2015) 20 tablet 0  . levofloxacin (LEVAQUIN) 500 MG tablet Take 1 tablet (500 mg total) by mouth daily. (Patient not taking: Reported on 11/29/2015) 4 tablet 0  . Menthol (HALLS COUGH DROPS MT) Use as directed 1 lozenge in the mouth or throat every 3 (three) hours as needed (for sore throat).    . potassium chloride (K-DUR) 10 MEQ tablet Take 1 tablet (10 mEq total) by mouth daily. (Patient not taking:  Reported on 04/30/2015) 5 tablet 0  . predniSONE (DELTASONE) 10 MG tablet Takes 6 tablets for 1 days, then 5 tablets for 1 days, then 4 tablets for 1 days, then 3 tablets for 1 days, then 2 tabs for 1 days, then 1 tab for 1 days, and then stop. (Patient not taking: Reported on 11/29/2015) 21 tablet 0   No current facility-administered medications on file prior to visit.   Family History  Problem Relation Age of Onset  . Cancer Father 42    stomach  . Hypertension Father    Social History   Social History  . Marital Status: Married    Spouse Name: N/A  . Number of Children: N/A  . Years of Education: N/A   Occupational History  . Not on file.   Social History Main Topics  . Smoking status: Former Smoker -- 0.50 packs/day for 20 years    Types: Cigarettes    Start date: 04/30/1975    Quit date: 04/30/1995  . Smokeless tobacco: Not on file  . Alcohol Use: No  . Drug Use: No  . Sexual Activity: Not on file   Other Topics Concern  . Not on file   Social History Narrative    Review of Systems: Constitutional: Negative for fever, chills, diaphoresis, activity change, appetite change and fatigue. HENT: Negative for ear pain, nosebleeds, congestion, facial swelling, rhinorrhea, neck pain, neck  stiffness and ear discharge.  Eyes: Negative for pain, discharge, redness, itching and visual disturbance. Respiratory: Negative for cough, choking, chest tightness, shortness of breath, wheezing and stridor.  Cardiovascular: Negative for chest pain, palpitations and leg swelling. Gastrointestinal: Negative for abdominal distention. Genitourinary: Negative for dysuria, urgency, frequency, hematuria, flank pain, decreased urine volume, difficulty urinating and dyspareunia.  Musculoskeletal: Negative for back pain, joint swelling, arthralgias and gait problem. Neurological: Negative for dizziness, tremors, seizures, syncope, facial asymmetry, speech difficulty, weakness, light-headedness,  numbness and headaches.  Hematological: Negative for adenopathy. Does not bruise/bleed easily. Psychiatric/Behavioral: Negative for hallucinations, behavioral problems, confusion, dysphoric mood, decreased concentration and agitation.    Objective:   Filed Vitals:   11/29/15 0915  BP: 145/92  Pulse: 76  Temp: 98 F (36.7 C)  Resp: 16    Physical Exam  Cardiovascular: Normal rate, regular rhythm and normal heart sounds.   No murmur heard. Pulmonary/Chest: Effort normal and breath sounds normal. No respiratory distress. She exhibits no tenderness.  Musculoskeletal: She exhibits no edema.  Neurological: She is alert.  Skin: Skin is warm and dry.     Lab Results  Component Value Date   WBC 4.8 11/19/2015   HGB 15.0 11/19/2015   HCT 45.9 11/19/2015   MCV 75.4* 11/19/2015   PLT 268 11/19/2015   Lab Results  Component Value Date   CREATININE 0.58 11/23/2015   BUN 7 11/23/2015   NA 141 11/23/2015   K 4.0 11/23/2015   CL 96* 11/23/2015   CO2 35* 11/23/2015    No results found for: HGBA1C Lipid Panel     Component Value Date/Time   CHOL 146 09/23/2015 1104   TRIG 42 09/23/2015 1104   HDL 72 09/23/2015 1104   CHOLHDL 2.0 09/23/2015 1104   VLDL 8 09/23/2015 1104   LDLCALC 66 09/23/2015 1104       Assessment and plan:   Tamara Russell was seen today for hospitalization follow-up.  Diagnoses and all orders for this visit:  Acute bronchitis, unspecified organism Resolved. I have explained return precautions.   Return if symptoms worsen or fail to improve.      Ambrose Finland, NP-C [facility] [phone] 11/29/2015, 1:51 PM

## 2015-11-29 NOTE — Progress Notes (Signed)
Patient states she is here for follow up from the hospital Patient states she was admitted with pneumonia

## 2015-12-23 MED FILL — LOSARTAN-HCTZ 100-25 MG TAB: 100-25 | 30 days supply | Qty: 30 | Fill #3

## 2015-12-23 MED FILL — AMLODIPINE BESYLATE 5 MG TA: 5 | 30 days supply | Qty: 30 | Fill #0

## 2016-01-24 MED FILL — AMLODIPINE BESYLATE 5 MG TA: 5 | 30 days supply | Qty: 30 | Fill #1

## 2016-01-24 MED FILL — LOSARTAN-HCTZ 100-25 MG TAB: 100-25 | 30 days supply | Qty: 30 | Fill #4

## 2016-02-22 MED FILL — ?AMLODIPINE BESYLATE 5 MG T: 5 | 30 days supply | Qty: 30 | Fill #2

## 2016-02-22 MED FILL — LOSARTAN-HCTZ 100-25 MG TAB: 100-25 | 30 days supply | Qty: 30 | Fill #5

## 2016-03-23 ENCOUNTER — Other Ambulatory Visit: Payer: Self-pay | Admitting: Internal Medicine

## 2016-03-23 MED FILL — ?AMLODIPINE BESYLATE 5 MG T: 5 | 30 days supply | Qty: 30 | Fill #3

## 2016-03-23 MED FILL — LOSARTAN-HCTZ 100-25 MG TAB: 100-25 | 30 days supply | Qty: 30 | Fill #0

## 2016-03-30 ENCOUNTER — Telehealth: Payer: Self-pay | Admitting: Internal Medicine

## 2016-03-30 DIAGNOSIS — I10 Essential (primary) hypertension: Secondary | ICD-10-CM

## 2016-03-30 MED ORDER — AMLODIPINE BESYLATE 5 MG PO TABS: 5.0000 mg | ORAL_TABLET | Freq: Every day | ORAL | Status: DC

## 2016-03-30 NOTE — Telephone Encounter (Signed)
Refilled x 30 days. 

## 2016-03-30 NOTE — Telephone Encounter (Signed)
Medication Refill: amLODipine (NORVASC) 5 MG tablet  Pt will call back in July to make appt to establish care with new PCP

## 2016-04-21 ENCOUNTER — Encounter: Payer: Self-pay | Admitting: Physician Assistant

## 2016-04-21 ENCOUNTER — Ambulatory Visit: Payer: BLUE CROSS/BLUE SHIELD | Attending: Internal Medicine | Admitting: Physician Assistant

## 2016-04-21 VITALS — BP 149/89 | HR 75 | Temp 98.5°F | Resp 16 | Wt 236.4 lb

## 2016-04-21 DIAGNOSIS — I10 Essential (primary) hypertension: Secondary | ICD-10-CM

## 2016-04-21 DIAGNOSIS — M25561 Pain in right knee: Secondary | ICD-10-CM | POA: Diagnosis not present

## 2016-04-21 DIAGNOSIS — M25562 Pain in left knee: Secondary | ICD-10-CM

## 2016-04-21 DIAGNOSIS — R609 Edema, unspecified: Secondary | ICD-10-CM

## 2016-04-21 DIAGNOSIS — R739 Hyperglycemia, unspecified: Secondary | ICD-10-CM

## 2016-04-21 LAB — HEMOGLOBIN A1C
Hgb A1c MFr Bld: 5.6 % (ref <5.7)
Mean Plasma Glucose: 114 mg/dL

## 2016-04-21 MED ORDER — LOSARTAN POTASSIUM-HCTZ 100-25 MG PO TABS: 1.0000 | ORAL_TABLET | Freq: Every day | ORAL | Status: DC

## 2016-04-21 MED FILL — ?LOSARTAN-HCTZ 100-25 MG TA: 100MG-25MG | 30 days supply | Qty: 30 | Fill #0

## 2016-04-21 NOTE — Progress Notes (Signed)
Patient ID: [name], female   DOB: [birth year], 55 y.o.   MRN: [id number]   Tamara Russell, is a 55 y.o. female  [id number]  [id number]  DOB - [birth year]  Chief Complaint  Patient presents with  . Leg Pain        Subjective:  Chief Complaint and HPI: Tamara Russell is a 55 y.o. female here today for B lower leg pain from the knees down to her feet.  She notices the pain mostly if she stands for more than 10-15 minutes.  She has also noticed some swelling. She denies claudication type symptoms.  She denies SOB or CP.  This has been going on for about 1 year.  She does not exercise.  She admits to poor diet.  Doesn't drink enough water.  She is compliant with her medications.  Aleve helps the pain  ROS:   Constitutional:  No f/c, No night sweats, No unexplained weight loss. EENT:  No vision changes, No blurry vision, No hearing changes. No mouth, throat, or ear problems.  Respiratory: No cough, No SOB Cardiac: No CP, no palpitations GI:  No abd pain, No N/V/D. GU: No Urinary s/sx Musculoskeletal: + general aches and pains Neuro: No headache, no dizziness, no motor weakness.  Skin: No rash Endocrine:  No polydipsia. No polyuria.  Psych: Denies SI/HI  No problems updated.  ALLERGIES: Allergies  Allergen Reactions  . Penicillins Other (See Comments)    Childhood allergy    PAST MEDICAL HISTORY: Past Medical History  Diagnosis Date  . Hypertension   . Acid reflux     MEDICATIONS AT HOME: Prior to Admission medications   Medication Sig Start Date End Date Taking? Authorizing Provider  amLODipine (NORVASC) 5 MG tablet Take 1 tablet (5 mg total) by mouth daily. 03/30/16  Yes Quentin Angstlugbemiga E Jegede, MD  cholecalciferol (VITAMIN D) 1000 UNITS tablet Take 1,000 Units by mouth daily.   Yes Historical Provider, MD  losartan-hydrochlorothiazide (HYZAAR) 100-25 MG tablet Take 1 tablet by mouth daily. Needs office visit for refills 04/21/16  Yes Anders SimmondsAngela M McClung, PA-C    Multiple Vitamins-Minerals (CENTRUM SILVER ADULT 50+ PO) Take 1 tablet by mouth daily.   Yes Historical Provider, MD  acetaminophen (TYLENOL) 500 MG tablet Take 1,000 mg by mouth every 6 (six) hours as needed for moderate pain. Reported on 04/21/2016    Historical Provider, MD  Menthol (HALLS COUGH DROPS MT) Use as directed 1 lozenge in the mouth or throat every 3 (three) hours as needed (for sore throat). Reported on 04/21/2016    Historical Provider, MD     Objective:  EXAM:   Filed Vitals:   04/21/16 1504  BP: 149/89  Pulse: 75  Temp: 98.5 F (36.9 C)  TempSrc: Oral  Resp: 16  Weight: 236 lb 6.4 oz (107.23 kg)  SpO2: 95%    General appearance : A&OX3. NAD. Non-toxic-appearing HEENT: Atraumatic and Normocephalic.   Neck: supple, no JVD. No cervical lymphadenopathy. No thyromegaly Chest/Lungs:  Breathing-non-labored, Good air entry bilaterally, breath sounds normal without rales, rhonchi, or wheezing  CVS: S1 S2 regular, no murmurs, gallops, rubs  Abdomen: Bowel sounds present, Non tender and not distended with no gaurding, rigidity or rebound. Extremities: Bilateral Lower Ext shows 1-2+ edema, both legs are warm to touch with = pulse throughout. Neurology:  CN II-XII grossly intact, Non focal.   Psych:  TP linear. J/I WNL. Normal speech. Appropriate eye contact and affect.  Skin:  No Rash/ulcerations/ or vascular changes.  Good pallor of BLE     Assessment & Plan   1. Essential hypertension suboptimal control. She will work on diet and exercise.  Continue current regimen for now. - Comprehensive metabolic panel  2. Hyperglycemia - Hemoglobin A1c  3. Arthralgia of both lower legs Likely secondary to deconditioning and dependent edema made worse by obesity and htn - Comprehensive metabolic panel  4. Edema, unspecified type Check CMP Likely secondary to deconditioning and dependent edema made worse by obesity and htn Increase water intake.  Cut down on sugars and salt.   Minimum of 5 mins rigorous physical activity 2x daily  Patient have been counseled extensively about nutrition and exercise  Return in about 4 weeks (around 05/19/2016) for with me-f/up for leg pain.  The patient was given clear instructions to go to ER or return to medical center if symptoms don't improve, worsen or new problems develop. The patient verbalized understanding. The patient was told to call to get lab results if they haven't heard anything in the next week.     Georgian CoAngela McClung, PA-C [facility], [place] [phone]   04/21/2016, 5:05 PM

## 2016-04-21 NOTE — Patient Instructions (Signed)
Exercise at least 5 minutes 2 X daily Drink 80-100 ounces of water daily. Cut back on processed foods, sugars, salt, and carbohydrates

## 2016-04-22 LAB — COMPREHENSIVE METABOLIC PANEL
ALK PHOS: 67 U/L (ref 33–130)
ALT: 17 U/L (ref 6–29)
AST: 23 U/L (ref 10–35)
Albumin: 3.7 g/dL (ref 3.6–5.1)
BILIRUBIN TOTAL: 0.3 mg/dL (ref 0.2–1.2)
BUN: 11 mg/dL (ref 7–25)
CO2: 35 mmol/L — ABNORMAL HIGH (ref 20–31)
Calcium: 9 mg/dL (ref 8.6–10.4)
Chloride: 95 mmol/L — ABNORMAL LOW (ref 98–110)
Creat: 0.57 mg/dL (ref 0.50–1.05)
GLUCOSE: 82 mg/dL (ref 65–99)
POTASSIUM: 3.7 mmol/L (ref 3.5–5.3)
SODIUM: 141 mmol/L (ref 135–146)
Total Protein: 5.9 g/dL — ABNORMAL LOW (ref 6.1–8.1)

## 2016-04-24 MED FILL — ?AMLODIPINE BESYLATE 5 MG T: 5 | 30 days supply | Qty: 30 | Fill #4

## 2016-04-28 ENCOUNTER — Telehealth: Payer: Self-pay

## 2016-04-28 NOTE — Telephone Encounter (Signed)
Contacted pt to go over lab results pt did not answer and was unable to lvm

## 2016-05-22 MED FILL — ?LOSARTAN-HCTZ 100-25 MG TA: 100MG-25MG | 30 days supply | Qty: 30 | Fill #1

## 2016-05-22 MED FILL — ?AMLODIPINE BESYLATE 5 MG T: 5 | 30 days supply | Qty: 30 | Fill #5

## 2016-06-20 MED FILL — ?AMLODIPINE BESYLATE 5 MG T: 5 | 30 days supply | Qty: 30 | Fill #0

## 2016-06-20 MED FILL — ?LOSARTAN-HCTZ 100-25 MG TA: 100MG-25MG | 30 days supply | Qty: 30 | Fill #2

## 2016-07-21 ENCOUNTER — Other Ambulatory Visit: Payer: Self-pay | Admitting: Internal Medicine

## 2016-07-21 DIAGNOSIS — I10 Essential (primary) hypertension: Secondary | ICD-10-CM

## 2016-07-21 MED FILL — AMLODIPINE BESYLATE 5 MG TA: 5 | 30 days supply | Qty: 30 | Fill #0

## 2016-07-21 MED FILL — LOSARTAN-HCTZ 100-25 MG TAB: 100-25 | 30 days supply | Qty: 30 | Fill #3

## 2016-08-21 ENCOUNTER — Other Ambulatory Visit: Payer: Self-pay | Admitting: Internal Medicine

## 2016-08-21 DIAGNOSIS — I10 Essential (primary) hypertension: Secondary | ICD-10-CM

## 2016-08-21 MED FILL — LOSARTAN-HCTZ 100-25 MG TAB: 100-25 | 30 days supply | Qty: 30 | Fill #4

## 2016-08-21 MED FILL — AMLODIPINE BESYLATE 5 MG TA: 5 | 30 days supply | Qty: 30 | Fill #0

## 2016-09-19 ENCOUNTER — Other Ambulatory Visit: Payer: Self-pay | Admitting: Internal Medicine

## 2016-09-19 DIAGNOSIS — I10 Essential (primary) hypertension: Secondary | ICD-10-CM

## 2016-09-19 MED FILL — LOSARTAN-HCTZ 100-25 MG TAB: 100-25 | 30 days supply | Qty: 30 | Fill #5

## 2016-09-20 ENCOUNTER — Other Ambulatory Visit: Payer: Self-pay | Admitting: Internal Medicine

## 2016-09-20 DIAGNOSIS — I10 Essential (primary) hypertension: Secondary | ICD-10-CM

## 2016-09-22 ENCOUNTER — Ambulatory Visit (HOSPITAL_COMMUNITY)
Admission: EM | Admit: 2016-09-22 | Discharge: 2016-09-22 | Disposition: A | Discharge status: Home / Self Care | Payer: BLUE CROSS/BLUE SHIELD | Attending: Family Medicine | Admitting: Family Medicine

## 2016-09-22 ENCOUNTER — Other Ambulatory Visit: Payer: Self-pay | Admitting: Internal Medicine

## 2016-09-22 ENCOUNTER — Encounter (HOSPITAL_COMMUNITY): Payer: Self-pay | Admitting: Emergency Medicine

## 2016-09-22 DIAGNOSIS — Z8679 Personal history of other diseases of the circulatory system: Secondary | ICD-10-CM

## 2016-09-22 DIAGNOSIS — I10 Essential (primary) hypertension: Secondary | ICD-10-CM | POA: Diagnosis not present

## 2016-09-22 DIAGNOSIS — Z760 Encounter for issue of repeat prescription: Secondary | ICD-10-CM

## 2016-09-22 MED ORDER — AMLODIPINE BESYLATE 5 MG PO TABS: 5.0000 mg | ORAL_TABLET | Freq: Every day | ORAL | 0 refills | Status: DC

## 2016-09-22 MED FILL — AMLODIPINE BESYLATE 5 MG TA: 5 | 14 days supply | Qty: 14 | Fill #0

## 2016-09-22 NOTE — ED Triage Notes (Signed)
Here for BP medication refill  Reports she last had her medication yest and wont be able to see PCP until next week  Needing amlodipine 5 mg... [facility]  A&O x4... NAD.

## 2016-09-22 NOTE — ED Provider Notes (Signed)
CSN: [id number]     Arrival date & time 09/22/16  1550 History   First MD Initiated Contact with Patient 09/22/16 1625     Chief Complaint  Patient presents with  . Medication Refill   (Consider location/radiation/quality/duration/timing/severity/associated sxs/prior Treatment) HPI  Tamara Russell is a 55 y.o. female presenting to UC with request for a refill on her BP medication, Amlodipine 5mg  once daily. Pt notes [facility]. Per medical records, pt was to f/u in August 2017, pt never did stating "I guess I forgot."  Pt tried to be seen today but her PCP was not available so they [facility]. Denies any symptoms today including headache, chest pain, dizziness or SOB. She took her last pill last night but notes she will get a headache w/o her medication.  She also takes Hyzaar but does not need a refill on that medication. No other concerns today.   Past Medical History:  Diagnosis Date  . Acid reflux   . Hypertension    Past Surgical History:  Procedure Laterality Date  . ABDOMINAL HYSTERECTOMY     Family History  Problem Relation Age of Onset  . Cancer Father [id number]    stomach  . Hypertension Father    Social History  Substance Use Topics  . Smoking status: Former Smoker    Packs/day: 0.50    Years: 20.00    Types: Cigarettes    Start date: 04/30/1975    Quit date: 04/30/1995  . Smokeless tobacco: Never Used  . Alcohol use No   OB History    No data available     Review of Systems  Respiratory: Negative for chest tightness and shortness of breath.   Cardiovascular: Negative for chest pain and palpitations.  Gastrointestinal: Negative for nausea and vomiting.  Neurological: Negative for dizziness, light-headedness and headaches.    Allergies  Penicillins  Home Medications   Prior to Admission medications   Medication Sig Start Date End Date Taking? Authorizing Provider   amLODipine (NORVASC) 5 MG tablet TAKE 1 TABLET BY MOUTH DAILY. 08/21/16  Yes Quentin Angstlugbemiga E Jegede, MD  cholecalciferol (VITAMIN D) 1000 UNITS tablet Take 1,000 Units by mouth daily.   Yes Historical Provider, MD  losartan-hydrochlorothiazide (HYZAAR) 100-25 MG tablet Take 1 tablet by mouth daily. Needs office visit for refills 04/21/16  Yes Anders Simmondsngela M McClung, PA-C  acetaminophen (TYLENOL) 500 MG tablet Take 1,000 mg by mouth every 6 (six) hours as needed for moderate pain. Reported on 04/21/2016    Historical Provider, MD  amLODipine (NORVASC) 5 MG tablet Take 1 tablet (5 mg total) by mouth daily. 09/22/16   Junius FinnerErin O'Malley, PA-C  Menthol (HALLS COUGH DROPS MT) Use as directed 1 lozenge in the mouth or throat every 3 (three) hours as needed (for sore throat). Reported on 04/21/2016    Historical Provider, MD  Multiple Vitamins-Minerals (CENTRUM SILVER ADULT 50+ PO) Take 1 tablet by mouth daily.    Historical Provider, MD   Meds Ordered and Administered this Visit  Medications - No data to display  BP 135/84 (BP Location: Left Arm)   Pulse 86   Temp 98.7 F (37.1 C) (Oral)   Resp 14   SpO2 96%  No data found.   Physical Exam  Constitutional: She is oriented to person, place, and time. She appears well-developed and well-nourished. No distress.  HENT:  Head: Normocephalic and atraumatic.  Eyes: EOM are normal.  Neck: Normal range of motion.  Cardiovascular: Normal rate and regular rhythm.   Pulmonary/Chest: Effort normal and breath sounds normal. No respiratory distress. She has no wheezes. She has no rales.  Musculoskeletal: Normal range of motion.  Neurological: She is alert and oriented to person, place, and time.  Skin: Skin is warm and dry. She is not diaphoretic.  Psychiatric: She has a normal mood and affect. Her behavior is normal.  Nursing note and vitals reviewed.   Urgent Care Course   Clinical Course     Procedures (including critical care time)  Labs Review Labs Reviewed -  No data to display  Imaging Review No results found.   MDM   1. Medication refill   2. History of hypertension   3. Essential hypertension    Pt presenting to UC with request for refill. No symptoms or concerns at this time.  Rx: Norvasc (14 day supply) pt as scheduled f/u with PCP on Monday December 18th.  Encouraged to f/u with PCP for routine visits for health maintenance.     Junius Finnerrin O'Malley, PA-C 09/22/16 [phone]

## 2016-10-02 ENCOUNTER — Encounter: Payer: Self-pay | Admitting: Family Medicine

## 2016-10-02 ENCOUNTER — Ambulatory Visit: Payer: BLUE CROSS/BLUE SHIELD | Attending: Family Medicine | Admitting: Family Medicine

## 2016-10-02 VITALS — BP 142/80 | HR 79 | Temp 98.5°F | Resp 20 | Ht 61.5 in | Wt 240.0 lb

## 2016-10-02 DIAGNOSIS — M545 Low back pain, unspecified: Secondary | ICD-10-CM

## 2016-10-02 DIAGNOSIS — I10 Essential (primary) hypertension: Secondary | ICD-10-CM | POA: Insufficient documentation

## 2016-10-02 MED ORDER — IBUPROFEN 800 MG PO TABS: 800.0000 mg | ORAL_TABLET | Freq: Three times a day (TID) | ORAL | 0 refills | Status: DC | PRN

## 2016-10-02 MED ORDER — LOSARTAN POTASSIUM-HCTZ 100-25 MG PO TABS: 1.0000 | ORAL_TABLET | Freq: Every day | ORAL | 2 refills | Status: DC

## 2016-10-02 MED ORDER — AMLODIPINE BESYLATE 10 MG PO TABS: 10.0000 mg | ORAL_TABLET | Freq: Every day | ORAL | 2 refills | Status: DC

## 2016-10-02 MED FILL — AMLODIPINE BESYLATE 10 MG T: 10 | 30 days supply | Qty: 30 | Fill #0

## 2016-10-02 MED FILL — IBUPROFEN 800 MG TABLET: 800 | 10 days supply | Qty: 30 | Fill #0

## 2016-10-02 NOTE — Patient Instructions (Addendum)
Musculoskeletal Pain Musculoskeletal pain is muscle and bone aches and pains. This pain can occur in any part of the body. Follow these instructions at home:  Only take medicines for pain, discomfort, or fever as told by your health care provider.  You may continue all activities unless the activities cause more pain. When the pain lessens, slowly resume normal activities. Gradually increase the intensity and duration of the activities or exercise.  During periods of severe pain, bed rest may be helpful. Lie or sit in any position that is comfortable, but get out of bed and walk around at least every several hours.  If directed, put ice on the injured area.  Put ice in a plastic bag.  Place a towel between your skin and the bag.  Leave the ice on for 20 minutes, 2-3 times a day. Contact a health care provider if:  Your pain is getting worse.  Your pain is not relieved with medicines.  You lose function in the area of the pain if the pain is in your arms, legs, or neck. This information is not intended to replace advice given to you by your health care provider. Make sure you discuss any questions you have with your health care provider. Document Released: 10/02/2005 Document Revised: 03/14/2016 Document Reviewed: 06/06/2013 Elsevier Interactive Patient Education  2017 [place]. Back Exercises Introduction If you have pain in your back, do these exercises 2-3 times each day or as told by your doctor. When the pain goes away, do the exercises once each day, but repeat the steps more times for each exercise (do more repetitions). If you do not have pain in your back, do these exercises once each day or as told by your doctor. Exercises Single Knee to Chest  Do these steps 3-5 times in a row for each leg: 1. Lie on your back on a firm bed or the floor with your legs stretched out. 2. Bring one knee to your chest. 3. Hold your knee to your chest by grabbing your knee or  thigh. 4. Pull on your knee until you feel a gentle stretch in your lower back. 5. Keep doing the stretch for 10-30 seconds. 6. Slowly let go of your leg and straighten it. Pelvic Tilt  Do these steps 5-10 times in a row: 1. Lie on your back on a firm bed or the floor with your legs stretched out. 2. Bend your knees so they point up to the ceiling. Your feet should be flat on the floor. 3. Tighten your lower belly (abdomen) muscles to press your lower back against the floor. This will make your tailbone point up to the ceiling instead of pointing down to your feet or the floor. 4. Stay in this position for 5-10 seconds while you gently tighten your muscles and breathe evenly. Cat-Cow  Do these steps until your lower back bends more easily: 1. Get on your hands and knees on a firm surface. Keep your hands under your shoulders, and keep your knees under your hips. You may put padding under your knees. 2. Let your head hang down, and make your tailbone point down to the floor so your lower back is round like the back of a cat. 3. Stay in this position for 5 seconds. 4. Slowly lift your head and make your tailbone point up to the ceiling so your back hangs low (sags) like the back of a cow. 5. Stay in this position for 5 seconds. Press-Ups  Do these  steps 5-10 times in a row: 1. Lie on your belly (face-down) on the floor. 2. Place your hands near your head, about shoulder-width apart. 3. While you keep your back relaxed and keep your hips on the floor, slowly straighten your arms to raise the top half of your body and lift your shoulders. Do not use your back muscles. To make yourself more comfortable, you may change where you place your hands. 4. Stay in this position for 5 seconds. 5. Slowly return to lying flat on the floor. Bridges  Do these steps 10 times in a row: 1. Lie on your back on a firm surface. 2. Bend your knees so they point up to the ceiling. Your feet should be flat on the  floor. 3. Tighten your butt muscles and lift your butt off of the floor until your waist is almost as high as your knees. If you do not feel the muscles working in your butt and the back of your thighs, slide your feet 1-2 inches farther away from your butt. 4. Stay in this position for 3-5 seconds. 5. Slowly lower your butt to the floor, and let your butt muscles relax. If this exercise is too easy, try doing it with your arms crossed over your chest. Belly Crunches  Do these steps 5-10 times in a row: 1. Lie on your back on a firm bed or the floor with your legs stretched out. 2. Bend your knees so they point up to the ceiling. Your feet should be flat on the floor. 3. Cross your arms over your chest. 4. Tip your chin a little bit toward your chest but do not bend your neck. 5. Tighten your belly muscles and slowly raise your chest just enough to lift your shoulder blades a tiny bit off of the floor. 6. Slowly lower your chest and your head to the floor. Back Lifts  Do these steps 5-10 times in a row: 1. Lie on your belly (face-down) with your arms at your sides, and rest your forehead on the floor. 2. Tighten the muscles in your legs and your butt. 3. Slowly lift your chest off of the floor while you keep your hips on the floor. Keep the back of your head in line with the curve in your back. Look at the floor while you do this. 4. Stay in this position for 3-5 seconds. 5. Slowly lower your chest and your face to the floor. Contact a doctor if:  Your back pain gets a lot worse when you do an exercise.  Your back pain does not lessen 2 hours after you exercise. If you have any of these problems, stop doing the exercises. Do not do them again unless your doctor says it is okay. Get help right away if:  You have sudden, very bad back pain. If this happens, stop doing the exercises. Do not do them again unless your doctor says it is okay. This information is not intended to replace advice  given to you by your health care provider. Make sure you discuss any questions you have with your health care provider. Document Released: 11/04/2010 Document Revised: 03/09/2016 Document Reviewed: 11/26/2014  2017 Elsevier DASH Eating Plan DASH stands for "Dietary Approaches to Stop Hypertension." The DASH eating plan is a healthy eating plan that has been shown to reduce high blood pressure (hypertension). Additional health benefits may include reducing the risk of type 2 diabetes mellitus, heart disease, and stroke. The DASH eating plan may also  help with weight loss. What do I need to know about the DASH eating plan? For the DASH eating plan, you will follow these general guidelines:  Choose foods with less than 150 milligrams of sodium per serving (as listed on the food label).  Use salt-free seasonings or herbs instead of table salt or sea salt.  Check with your health care provider or pharmacist before using salt substitutes.  Eat lower-sodium products. These are often labeled as "low-sodium" or "no salt added."  Eat fresh foods. Avoid eating a lot of canned foods.  Eat more vegetables, fruits, and low-fat dairy products.  Choose whole grains. Look for the word "whole" as the first word in the ingredient list.  Choose fish and skinless chicken or [place] more often than red meat. Limit fish, poultry, and meat to 6 oz (170 g) each day.  Limit sweets, desserts, sugars, and sugary drinks.  Choose heart-healthy fats.  Eat more home-cooked food and less restaurant, buffet, and fast food.  Limit fried foods.  Do not fry foods. Cook foods using methods such as baking, boiling, grilling, and broiling instead.  When eating at a restaurant, ask that your food be prepared with less salt, or no salt if possible. What foods can I eat? Seek help from a dietitian for individual calorie needs. Grains  Whole grain or whole wheat bread. Brown rice. Whole grain or whole wheat pasta. Quinoa,  bulgur, and whole grain cereals. Low-sodium cereals. Corn or whole wheat flour tortillas. Whole grain cornbread. Whole grain crackers. Low-sodium crackers. Vegetables  Fresh or frozen vegetables (raw, steamed, roasted, or grilled). Low-sodium or reduced-sodium tomato and vegetable juices. Low-sodium or reduced-sodium tomato sauce and paste. Low-sodium or reduced-sodium canned vegetables. Fruits  All fresh, canned (in natural juice), or frozen fruits. Meat and Other Protein Products  Ground beef (85% or leaner), grass-fed beef, or beef trimmed of fat. Skinless chicken or [place]. Ground chicken or [place]. Pork trimmed of fat. All fish and seafood. Eggs. Dried beans, peas, or lentils. Unsalted nuts and seeds. Unsalted canned beans. Dairy  Low-fat dairy products, such as skim or 1% milk, 2% or reduced-fat cheeses, low-fat ricotta or cottage cheese, or plain low-fat yogurt. Low-sodium or reduced-sodium cheeses. Fats and Oils  Tub margarines without trans fats. Light or reduced-fat mayonnaise and salad dressings (reduced sodium). Avocado. Safflower, olive, or canola oils. Natural peanut or almond butter. Other  Unsalted popcorn and pretzels. The items listed above may not be a complete list of recommended foods or beverages. Contact your dietitian for more options.  What foods are not recommended? Grains  White bread. White pasta. White rice. Refined cornbread. Bagels and croissants. Crackers that contain trans fat. Vegetables  Creamed or fried vegetables. Vegetables in a cheese sauce. Regular canned vegetables. Regular canned tomato sauce and paste. Regular tomato and vegetable juices. Fruits  Canned fruit in light or heavy syrup. Fruit juice. Meat and Other Protein Products  Fatty cuts of meat. Ribs, chicken wings, bacon, sausage, bologna, salami, chitterlings, fatback, hot dogs, bratwurst, and packaged luncheon meats. Salted nuts and seeds. Canned beans with salt. Dairy  Whole or 2% milk, cream,  half-and-half, and cream cheese. Whole-fat or sweetened yogurt. Full-fat cheeses or blue cheese. Nondairy creamers and whipped toppings. Processed cheese, cheese spreads, or cheese curds. Condiments  Onion and garlic salt, seasoned salt, table salt, and sea salt. Canned and packaged gravies. Worcestershire sauce. Tartar sauce. Barbecue sauce. Teriyaki sauce. Soy sauce, including reduced sodium. Steak sauce. Fish sauce. Oyster sauce. Cocktail  sauce. Horseradish. Ketchup and mustard. Meat flavorings and tenderizers. Bouillon cubes. Hot sauce. Tabasco sauce. Marinades. Taco seasonings. Relishes. Fats and Oils  Butter, stick margarine, lard, shortening, ghee, and bacon fat. Coconut, palm kernel, or palm oils. Regular salad dressings. Other  Pickles and olives. Salted popcorn and pretzels. The items listed above may not be a complete list of foods and beverages to avoid. Contact your dietitian for more information.  Where can I find more information? National Heart, Lung, and Blood Institute: CableProm[employer].itwww.nhlbi.nih.gov/health/health-topics/topics/dash/ This information is not intended to replace advice given to you by your health care provider. Make sure you discuss any questions you have with your health care provider. Document Released: 09/21/2011 Document Revised: 03/09/2016 Document Reviewed: 08/06/2013 Elsevier Interactive Patient Education  2017 Elsevier Inc.Calorie Counting for [employer] Calories are energy you get from the things you eat and drink. Your body uses this energy to keep you going throughout the day. The number of calories you eat affects your weight. When you eat more calories than your body needs, your body stores the extra calories as fat. When you eat fewer calories than your body needs, your body burns fat to get the energy it needs. Calorie counting means keeping track of how many calories you eat and drink each day. If you make sure to eat fewer calories than your body needs, you should  lose weight. In order for calorie counting to work, you will need to eat the number of calories that are right for you in a day to lose a healthy amount of weight per week. A healthy amount of weight to lose per week is usually 1-2 lb (0.5-0.9 kg). A dietitian can determine how many calories you need in a day and give you suggestions on how to reach your calorie goal.  WHAT IS MY MY PLAN? My goal is to have __________ calories per day.  If I have this many calories per day, I should lose around __________ pounds per week. WHAT DO I NEED TO KNOW ABOUT CALORIE COUNTING? In order to meet your daily calorie goal, you will need to:  Find out how many calories are in each food you would like to eat. Try to do this before you eat.  Decide how much of the food you can eat.  Write down what you ate and how many calories it had. Doing this is called keeping a food log. WHERE DO I FIND CALORIE INFORMATION? The number of calories in a food can be found on a Nutrition Facts label. Note that all the information on a label is based on a specific serving of the food. If a food does not have a Nutrition Facts label, try to look up the calories online or ask your dietitian for help. HOW DO I DECIDE HOW MUCH TO EAT? To decide how much of the food you can eat, you will need to consider both the number of calories in one serving and the size of one serving. This information can be found on the Nutrition Facts label. If a food does not have a Nutrition Facts label, look up the information online or ask your dietitian for help. Remember that calories are listed per serving. If you choose to have more than one serving of a food, you will have to multiply the calories per serving by the amount of servings you plan to eat. For example, the label on a package of bread might say that a serving size is 1 slice and that there  are 90 calories in a serving. If you eat 1 slice, you will have eaten 90 calories. If you eat 2 slices,  you will have eaten 180 calories. HOW DO I KEEP A FOOD LOG? After each meal, record the following information in your food log:  What you ate.  How much of it you ate.  How many calories it had.  Then, add up your calories. Keep your food log near you, such as in a small notebook in your pocket. Another option is to use a mobile app or website. Some programs will calculate calories for you and show you how many calories you have left each time you add an item to the log. WHAT ARE SOME CALORIE COUNTING TIPS?  Use your calories on foods and drinks that will fill you up and not leave you hungry. Some examples of this include foods like nuts and nut butters, vegetables, lean proteins, and high-fiber foods (more than 5 g fiber per serving).  Eat nutritious foods and avoid empty calories. Empty calories are calories you get from foods or beverages that do not have many nutrients, such as candy and soda. It is better to have a nutritious high-calorie food (such as an avocado) than a food with few nutrients (such as a bag of chips).  Know how many calories are in the foods you eat most often. This way, you do not have to look up how many calories they have each time you eat them.  Look out for foods that may seem like low-calorie foods but are really high-calorie foods, such as baked goods, soda, and fat-free candy.  Pay attention to calories in drinks. Drinks such as sodas, specialty coffee drinks, alcohol, and juices have a lot of calories yet do not fill you up. Choose low-calorie drinks like water and diet drinks.  Focus your calorie counting efforts on higher calorie items. Logging the calories in a garden salad that contains only vegetables is less important than calculating the calories in a milk shake.  Find a way of tracking calories that works for you. Get creative. Most people who are successful find ways to keep track of how much they eat in a day, even if they do not count every  calorie. WHAT ARE SOME PORTION CONTROL TIPS?  Know how many calories are in a serving. This will help you know how many servings of a certain food you can have.  Use a measuring cup to measure serving sizes. This is helpful when you start out. With time, you will be able to estimate serving sizes for some foods.  Take some time to put servings of different foods on your favorite plates, bowls, and cups so you know what a serving looks like.  Try not to eat straight from a bag or box. Doing this can lead to overeating. Put the amount you would like to eat in a cup or on a plate to make sure you are eating the right portion.  Use smaller plates, glasses, and bowls to prevent overeating. This is a quick and easy way to practice portion control. If your plate is smaller, less food can fit on it.  Try not to multitask while eating, such as watching TV or using your computer. If it is time to eat, sit down at a table and enjoy your food. Doing this will help you to start recognizing when you are full. It will also make you more aware of what and how much you are eating.  HOW CAN I CALORIE COUNT WHEN EATING OUT?  Ask for smaller portion sizes or child-sized portions.  Consider sharing an entree and sides instead of getting your own entree.  If you get your own entree, eat only half. Ask for a box at the beginning of your meal and put the rest of your entree in it so you are not tempted to eat it.  Look for the calories on the menu. If calories are listed, choose the lower calorie options.  Choose dishes that include vegetables, fruits, whole grains, low-fat dairy products, and lean protein. Focusing on smart food choices from each of the 5 food groups can help you stay on track at restaurants.  Choose items that are boiled, broiled, grilled, or steamed.  Choose water, milk, unsweetened iced tea, or other drinks without added sugars. If you want an alcoholic beverage, choose a lower calorie  option. For example, a regular margarita can have up to 700 calories and a glass of wine has around 150.  Stay away from items that are buttered, battered, fried, or served with cream sauce. Items labeled "crispy" are usually fried, unless stated otherwise.  Ask for dressings, sauces, and syrups on the side. These are usually very high in calories, so do not eat much of them.  Watch out for salads. Many people think salads are a healthy option, but this is often not the case. Many salads come with bacon, fried chicken, lots of cheese, fried chips, and dressing. All of these items have a lot of calories. If you want a salad, choose a garden salad and ask for grilled meats or steak. Ask for the dressing on the side, or ask for olive oil and vinegar or lemon to use as dressing.  Estimate how many servings of a food you are given. For example, a serving of cooked rice is  cup or about the size of half a tennis ball or one cupcake wrapper. Knowing serving sizes will help you be aware of how much food you are eating at restaurants. The list below tells you how big or small some common portion sizes are based on everyday objects.  1 oz-4 stacked dice.  3 oz-1 deck of cards.  1 tsp-1 dice.  1 Tbsp- a Ping-Pong ball.  2 Tbsp-1 Ping-Pong ball.   cup-1 tennis ball or 1 cupcake wrapper.  1 cup-1 baseball. This information is not intended to replace advice given to you by your health care provider. Make sure you discuss any questions you have with your health care provider. Document Released: 10/02/2005 Document Revised: 10/23/2014 Document Reviewed: 08/07/2013 Elsevier Interactive Patient Education  2017 [employer].

## 2016-10-02 NOTE — Progress Notes (Signed)
Subjective:  Patient ID: Tamara Russell, female    DOB: [birth year]  Age: 55 y.o. MRN: [id number]  CC: Hypertension and Back Pain   [facility] care for HTN. She denies any CP, SOB, or swelling of her extremities. She also has complaints of lower back pain 5/10. She reports lower back pain for 2 months. She denies any changes to bowel or bladder habits, numbness, or tingling. She reports noticing the back pain since she has gained weight. She expresses desire to lose weight. She reports only eating 1 meal/day. Educated patient about healthy diet, increasing meals to 5 to 6 small meals per day, and exercise.   Outpatient Medications Prior to Visit  Medication Sig Dispense Refill  . acetaminophen (TYLENOL) 500 MG tablet Take 1,000 mg by mouth every 6 (six) hours as needed for moderate pain. Reported on 04/21/2016    . cholecalciferol (VITAMIN D) 1000 UNITS tablet Take 1,000 Units by mouth daily.    . Multiple Vitamins-Minerals (CENTRUM SILVER ADULT 50+ PO) Take 1 tablet by mouth daily.    Marland Kitchen. amLODipine (NORVASC) 5 MG tablet TAKE 1 TABLET BY MOUTH DAILY. MUST HAVE OFFICE VISIT FOR REFILLS 30 tablet 0  . amLODipine (NORVASC) 5 MG tablet Take 1 tablet (5 mg total) by mouth daily. 14 tablet 0  . losartan-hydrochlorothiazide (HYZAAR) 100-25 MG tablet Take 1 tablet by mouth daily. Needs office visit for refills 90 tablet 2  . Menthol (HALLS COUGH DROPS MT) Use as directed 1 lozenge in the mouth or throat every 3 (three) hours as needed (for sore throat). Reported on 04/21/2016     No facility-administered medications prior to visit.     ROS Review of Systems  Respiratory: Negative.   Cardiovascular: Negative.   Gastrointestinal: Negative.   Genitourinary: Negative.   Musculoskeletal: Positive for back pain.    Objective:  BP (!) 142/80   Pulse 79   Temp 98.5 F (36.9 C) (Oral)   Resp 20   Ht 5' 1.5" (1.562 m)   Wt 240 lb (108.9 kg)   SpO2  95%   BMI 44.61 kg/m   BP/Weight 10/02/2016 09/22/2016 04/21/2016  Systolic BP 142 135 149  Diastolic BP 80 84 89  Wt. (Lbs) 240 - 236.4  BMI 44.61 - 44.69   Physical Exam  Constitutional: She appears well-developed and well-nourished.  Cardiovascular: Normal rate, regular rhythm, normal heart sounds and intact distal pulses.   Pulmonary/Chest: Effort normal and breath sounds normal.  Abdominal: Soft. Bowel sounds are normal. She exhibits no distension and no mass.  Musculoskeletal:       Lumbar back: She exhibits pain.   Assessment & Plan:   Problem List Items Addressed This Visit      Cardiovascular and Mediastinum   HTN (hypertension) - Primary   Relevant Medications   amLODipine (NORVASC) 10 MG tablet   losartan-hydrochlorothiazide (HYZAAR) 100-25 MG tablet   Other Relevant Orders   Lipid Panel   Microalbumin / creatinine urine ratio    Other Visit Diagnoses    Acute bilateral low back pain without sciatica       Relevant Medications   ibuprofen (ADVIL,MOTRIN) 800 MG tablet     Meds ordered this encounter  Medications  . amLODipine (NORVASC) 10 MG tablet    Sig: Take 1 tablet (10 mg total) by mouth daily.    Dispense:  30 tablet    Refill:  2    Order Specific Question:  Supervising Provider    Answer:   Quentin AngstJEGEDE, OLUGBEMIGA E [id number]  . losartan-hydrochlorothiazide (HYZAAR) 100-25 MG tablet    Sig: Take 1 tablet by mouth daily. Needs office visit for refills    Dispense:  30 tablet    Refill:  2    Order Specific Question:   Supervising Provider    Answer:   Quentin AngstJEGEDE, OLUGBEMIGA E [id number]]  . DISCONTD: ibuprofen (ADVIL,MOTRIN) 800 MG tablet    Sig: Take 1 tablet (800 mg total) by mouth every 8 (eight) hours as needed.    Dispense:  30 tablet    Refill:  0    Order Specific Question:   Supervising Provider    Answer:   Quentin AngstJEGEDE, OLUGBEMIGA E [id number]  . ibuprofen (ADVIL,MOTRIN) 800 MG tablet    Sig: Take 1 tablet (800 mg total) by mouth every 8 (eight) hours  as needed (Take with food.).    Dispense:  30 tablet    Refill:  0    Order Specific Question:   Supervising Provider    Answer:   Quentin AngstJEGEDE, OLUGBEMIGA E [id number]    Follow-up: As needed. Follow up in 3 months for HTN.  Lizbeth BarkMandesia R Matilynn Dacey FNP

## 2016-10-02 NOTE — Progress Notes (Signed)
Follow up  HTN. Discuss weight loss.

## 2016-10-03 ENCOUNTER — Telehealth: Payer: Self-pay

## 2016-10-03 NOTE — Telephone Encounter (Signed)
Patient reminded of follow up Lab work (fasting) /BP check. Advised to callback and schedule appointments.

## 2016-10-13 ENCOUNTER — Ambulatory Visit: Payer: BLUE CROSS/BLUE SHIELD | Attending: Family Medicine

## 2016-10-13 ENCOUNTER — Other Ambulatory Visit: Payer: BLUE CROSS/BLUE SHIELD

## 2016-10-13 DIAGNOSIS — I10 Essential (primary) hypertension: Secondary | ICD-10-CM | POA: Diagnosis not present

## 2016-10-13 LAB — LIPID PANEL
CHOLESTEROL: 164 mg/dL (ref <200)
HDL: 65 mg/dL (ref >50)
LDL CALC: 87 mg/dL (ref <100)
Total CHOL/HDL Ratio: 2.5 Ratio (ref <5.0)
Triglycerides: 59 mg/dL (ref <150)
VLDL: 12 mg/dL (ref <30)

## 2016-10-13 NOTE — Progress Notes (Signed)
Patient here for lab visit only 

## 2016-10-14 LAB — MICROALBUMIN / CREATININE URINE RATIO
CREATININE, URINE: 229 mg/dL (ref 20–320)
MICROALB/CREAT RATIO: 4 ug/mg{creat} (ref <30)
Microalb, Ur: 1 mg/dL

## 2016-10-18 MED FILL — LOSARTAN-HCTZ 100-25 MG TAB: 100-25 | 30 days supply | Qty: 30 | Fill #6

## 2016-10-19 ENCOUNTER — Telehealth: Payer: Self-pay | Admitting: *Deleted

## 2016-10-19 NOTE — Telephone Encounter (Signed)
Patient verified DOB Patient is aware of cholesterol and micro/albumin being normal. Patient advised to continue with current BP medications and to keep cholesterol level in normal range be proactive and implement some diet changes and increasing exercise as tolerated. Patient expressed her understanding and had no further questions at this time.

## 2016-10-19 NOTE — Telephone Encounter (Signed)
-----   Message from Lizbeth BarkMandesia R Hairston, [place] sent at 10/17/2016  2:40 PM EST ----- - Microalbumin/creatinine ratio level was normal. This tests for protein in your urine that can indicate early signs of kidney damage. Continue to take your blood pressure medications.  -Cholesterol levels were normal. To help prevent high cholesterol, start eating a diet low in saturated fat. Limit your intake of fried foods, red meats, and whole milk.  Begin exercising at least 3-5 times per week for 30 minutes.

## 2016-11-02 MED FILL — AMLODIPINE BESYLATE 10 MG T: 10 | 30 days supply | Qty: 30 | Fill #1

## 2016-11-20 MED FILL — LOSARTAN-HCTZ 100-25 MG TAB: 100-25 | 30 days supply | Qty: 30 | Fill #7

## 2016-12-01 MED FILL — AMLODIPINE BESYLATE 10 MG T: 10 | 30 days supply | Qty: 30 | Fill #2

## 2016-12-28 ENCOUNTER — Encounter: Payer: Self-pay | Admitting: Family Medicine

## 2016-12-28 ENCOUNTER — Ambulatory Visit: Payer: BLUE CROSS/BLUE SHIELD | Attending: Family Medicine | Admitting: Family Medicine

## 2016-12-28 VITALS — BP 137/87 | HR 72 | Temp 97.9°F | Resp 18 | Ht 61.0 in | Wt 241.8 lb

## 2016-12-28 DIAGNOSIS — I10 Essential (primary) hypertension: Secondary | ICD-10-CM | POA: Diagnosis not present

## 2016-12-28 DIAGNOSIS — H547 Unspecified visual loss: Secondary | ICD-10-CM | POA: Diagnosis not present

## 2016-12-28 DIAGNOSIS — Z973 Presence of spectacles and contact lenses: Secondary | ICD-10-CM

## 2016-12-28 MED ORDER — LOSARTAN POTASSIUM 100 MG PO TABS: 100.0000 mg | ORAL_TABLET | Freq: Every day | ORAL | 2 refills | Status: DC

## 2016-12-28 MED ORDER — HYDROCHLOROTHIAZIDE 50 MG PO TABS: 50.0000 mg | ORAL_TABLET | Freq: Every day | ORAL | 2 refills | Status: DC

## 2016-12-28 MED ORDER — AMLODIPINE BESYLATE 10 MG PO TABS: 10.0000 mg | ORAL_TABLET | Freq: Every day | ORAL | 2 refills | Status: DC

## 2016-12-28 MED FILL — HYDROCHLOROTHIAZIDE 25 MG T: 25 | 30 days supply | Qty: 60 | Fill #0

## 2016-12-28 MED FILL — LOSARTAN POTASSIUM 100 MG T: 100 | 30 days supply | Qty: 30 | Fill #0

## 2016-12-28 MED FILL — AMLODIPINE BESYLATE 10 MG T: 10 | 30 days supply | Qty: 30 | Fill #0

## 2016-12-28 NOTE — Progress Notes (Signed)
Patient is here for HTN   Patient denies pain for today  Patient has not taking her meds for today

## 2016-12-28 NOTE — Patient Instructions (Addendum)
Schedule follow up for blood pressure check in 2 week with the nurse.  Schedule routine follow up for Hypertension in 3 months.    DASH Eating Plan DASH stands for "Dietary Approaches to Stop Hypertension." The DASH eating plan is a healthy eating plan that has been shown to reduce high blood pressure (hypertension). It may also reduce your risk for type 2 diabetes, heart disease, and stroke. The DASH eating plan may also help with weight loss. What are tips for following this plan? General guidelines   Avoid eating more than 2,300 mg (milligrams) of salt (sodium) a day. If you have hypertension, you may need to reduce your sodium intake to 1,500 mg a day.  Limit alcohol intake to no more than 1 drink a day for nonpregnant women and 2 drinks a day for men. One drink equals 12 oz of beer, 5 oz of wine, or 1 oz of hard liquor.  Work with your health care provider to maintain a healthy body weight or to lose weight. Ask what an ideal weight is for you.  Get at least 30 minutes of exercise that causes your heart to beat faster (aerobic exercise) most days of the week. Activities may include walking, swimming, or biking.  Work with your health care provider or diet and nutrition specialist (dietitian) to adjust your eating plan to your individual calorie needs. Reading food labels   Check food labels for the amount of sodium per serving. Choose foods with less than 5 percent of the Daily Value of sodium. Generally, foods with less than 300 mg of sodium per serving fit into this eating plan.  To find whole grains, look for the word "whole" as the first word in the ingredient list. Shopping   Buy products labeled as "low-sodium" or "no salt added."  Buy fresh foods. Avoid canned foods and premade or frozen meals. Cooking   Avoid adding salt when cooking. Use salt-free seasonings or herbs instead of table salt or sea salt. Check with your health care provider or pharmacist before using salt  substitutes.  Do not fry foods. Cook foods using healthy methods such as baking, boiling, grilling, and broiling instead.  Cook with heart-healthy oils, such as olive, canola, soybean, or sunflower oil. Meal planning    Eat a balanced diet that includes:  5 or more servings of fruits and vegetables each day. At each meal, try to fill half of your plate with fruits and vegetables.  Up to 6-8 servings of whole grains each day.  Less than 6 oz of lean meat, poultry, or fish each day. A 3-oz serving of meat is about the same size as a deck of cards. One egg equals 1 oz.  2 servings of low-fat dairy each day.  A serving of nuts, seeds, or beans 5 times each week.  Heart-healthy fats. Healthy fats called Omega-3 fatty acids are found in foods such as flaxseeds and coldwater fish, like sardines, salmon, and mackerel.  Limit how much you eat of the following:  Canned or prepackaged foods.  Food that is high in trans fat, such as fried foods.  Food that is high in saturated fat, such as fatty meat.  Sweets, desserts, sugary drinks, and other foods with added sugar.  Full-fat dairy products.  Do not salt foods before eating.  Try to eat at least 2 vegetarian meals each week.  Eat more home-cooked food and less restaurant, buffet, and fast food.  When eating at a restaurant, ask that  your food be prepared with less salt or no salt, if possible. What foods are recommended? The items listed may not be a complete list. Talk with your dietitian about what dietary choices are best for you. Grains  Whole-grain or whole-wheat bread. Whole-grain or whole-wheat pasta. Brown rice. Tamara Russell. Bulgur. Whole-grain and low-sodium cereals. Pita bread. Low-fat, low-sodium crackers. Whole-wheat flour tortillas. Vegetables  Fresh or frozen vegetables (raw, steamed, roasted, or grilled). Low-sodium or reduced-sodium tomato and vegetable juice. Low-sodium or reduced-sodium tomato sauce and  tomato paste. Low-sodium or reduced-sodium canned vegetables. Fruits  All fresh, dried, or frozen fruit. Canned fruit in natural juice (without added sugar). Meat and other protein foods  Skinless chicken or [place]. Ground chicken or [place]. Pork with fat trimmed off. Fish and seafood. Egg whites. Dried beans, peas, or lentils. Unsalted nuts, nut butters, and seeds. Unsalted canned beans. Lean cuts of beef with fat trimmed off. Low-sodium, lean deli meat. Dairy  Low-fat (1%) or fat-free (skim) milk. Fat-free, low-fat, or reduced-fat cheeses. Nonfat, low-sodium ricotta or cottage cheese. Low-fat or nonfat yogurt. Low-fat, low-sodium cheese. Fats and oils  Soft margarine without trans fats. Vegetable oil. Low-fat, reduced-fat, or light mayonnaise and salad dressings (reduced-sodium). Canola, safflower, olive, soybean, and sunflower oils. Avocado. Seasoning and other foods  Herbs. Spices. Seasoning mixes without salt. Unsalted popcorn and pretzels. Fat-free sweets. What foods are not recommended? The items listed may not be a complete list. Talk with your dietitian about what dietary choices are best for you. Grains  Baked goods made with fat, such as croissants, muffins, or some breads. Dry pasta or rice meal packs. Vegetables  Creamed or fried vegetables. Vegetables in a cheese sauce. Regular canned vegetables (not low-sodium or reduced-sodium). Regular canned tomato sauce and paste (not low-sodium or reduced-sodium). Regular tomato and vegetable juice (not low-sodium or reduced-sodium). Tamara Russell. Olives. Fruits  Canned fruit in a light or heavy syrup. Fried fruit. Fruit in cream or butter sauce. Meat and other protein foods  Fatty cuts of meat. Ribs. Fried meat. Tamara Russell. Sausage. Bologna and other processed lunch meats. Salami. Fatback. Hotdogs. Bratwurst. Salted nuts and seeds. Canned beans with added salt. Canned or smoked fish. Whole eggs or egg yolks. Chicken or [place] with skin. Dairy  Whole  or 2% milk, cream, and half-and-half. Whole or full-fat cream cheese. Whole-fat or sweetened yogurt. Full-fat cheese. Nondairy creamers. Whipped toppings. Processed cheese and cheese spreads. Fats and oils  Butter. Stick margarine. Lard. Shortening. Ghee. Bacon fat. Tropical oils, such as coconut, palm kernel, or palm oil. Seasoning and other foods  Salted popcorn and pretzels. Onion salt, garlic salt, seasoned salt, table salt, and sea salt. Worcestershire sauce. Tartar sauce. Barbecue sauce. Teriyaki sauce. Soy sauce, including reduced-sodium. Steak sauce. Canned and packaged gravies. Fish sauce. Oyster sauce. Cocktail sauce. Horseradish that you find on the shelf. Ketchup. Mustard. Meat flavorings and tenderizers. Bouillon cubes. Hot sauce and Tabasco sauce. Premade or packaged marinades. Premade or packaged taco seasonings. Relishes. Regular salad dressings. Where to find more information:  National Heart, Lung, and [place]: https://wilson-eaton.com/  American Heart Association: www.heart.org Summary  The DASH eating plan is a healthy eating plan that has been shown to reduce high blood pressure (hypertension). It may also reduce your risk for type 2 diabetes, heart disease, and stroke.  With the DASH eating plan, you should limit salt (sodium) intake to 2,300 mg a day. If you have hypertension, you may need to reduce your sodium intake to 1,500 mg a day.  When on the DASH eating plan, aim to eat more fresh fruits and vegetables, whole grains, lean proteins, low-fat dairy, and heart-healthy fats.  Work with your health care provider or diet and nutrition specialist (dietitian) to adjust your eating plan to your individual calorie needs. This information is not intended to replace advice given to you by your health care provider. Make sure you discuss any questions you have with your health care provider. Document Released: 09/21/2011 Document Revised: 09/25/2016 Document Reviewed:  09/25/2016 Elsevier Interactive Patient Education  2017 [employer]  Exercising to [employer] Minor[employer]ose Weight Exercising can help you to lose weight. In order to lose weight through exercise, you need to do vigorous-intensity exercise. You can tell that you are exercising with vigorous intensity if you are breathing very hard and fast and cannot hold a conversation while exercising. Moderate-intensity exercise helps to maintain your current weight. You can tell that you are exercising at a moderate level if you have a higher heart rate and faster breathing, but you are still able to hold a conversation. How often should I exercise? Choose an activity that you enjoy and set realistic goals. Your health care provider can help you to make an activity plan that works for you. Exercise regularly as directed by your health care provider. This may include:  Doing resistance training twice each week, such as:  Push-ups.  Sit-ups.  Lifting weights.  Using resistance bands.  Doing a given intensity of exercise for a given amount of time. Choose from these options:  150 minutes of moderate-intensity exercise every week.  75 minutes of vigorous-intensity exercise every week.  A mix of moderate-intensity and vigorous-intensity exercise every week. Children, pregnant women, people who are out of shape, people who are overweight, and older adults may need to consult a health care provider for individual recommendations. If you have any sort of medical condition, be sure to consult your health care provider before starting a new exercise program. What are some activities that can help me to lose weight?  Walking at a rate of at least 4.5 miles an hour.  Jogging or running at a rate of 5 miles per hour.  Biking at a rate of at least 10 miles per hour.  Lap swimming.  Roller-skating or in-line skating.  Cross-country skiing.  Vigorous competitive sports, such as football, basketball, and  soccer.  Jumping rope.  Aerobic dancing. How can I be more active in my day-to-day activities?  Use the stairs instead of the elevator.  Take a walk during your lunch break.  If you drive, park your car farther away from work or school.  If you take public transportation, get off one stop early and walk the rest of the way.  Make all of your phone calls while standing up and walking around.  Get up, stretch, and walk around every 30 minutes throughout the day. What guidelines should I follow while exercising?  Do not exercise so much that you hurt yourself, feel dizzy, or get very short of breath.  Consult your health care provider prior to starting a new exercise program.  Wear comfortable clothes and shoes with good support.  Drink plenty of water while you exercise to prevent dehydration or heat stroke. Body water is lost during exercise and must be replaced.  Work out until you breathe faster and your heart beats faster. This information is not intended to replace advice given to you by your health care provider. Make sure you discuss any questions you  have with your health care provider. Document Released: 11/04/2010 Document Revised: 03/09/2016 Document Reviewed: 03/05/2014 Elsevier Interactive Patient Education  2017 Elsevier Inc.  Hypotension Hypotension, commonly called low blood pressure, is when the force of blood pumping through your arteries is too weak. Arteries are blood vessels that carry blood from the heart throughout the body. When blood pressure is too low, you may not get enough blood to your brain or to the rest of your organs. This can cause weakness, light-headedness, rapid heartbeat, and fainting. Depending on the cause and severity, hypotension may be harmless (benign) or cause serious problems (critical). What are the causes? Possible causes of hypotension include:  Blood loss.  Loss of body fluids (dehydration).  Heart problems.  Hormone  (endocrine) problems.  Pregnancy.  Severe infection.  Lack of certain nutrients.  Severe allergic reactions (anaphylaxis).  Certain medicines, such as blood pressure medicine or medicines that make the body lose excess fluids (diuretics). Sometimes, hypotension can be caused by not taking medicine as directed, such as taking too much of a certain medicine. What increases the risk? Certain factors can make you more likely to develop hypotension, including:  Age. Risk increases as you get older.  Conditions that affect the heart or the central nervous system.  Taking certain medicines, such as blood pressure medicine or diuretics.  Being pregnant. What are the signs or symptoms? Symptoms of this condition may include:  Weakness.  Light-headedness.  Dizziness.  Blurred vision.  Fatigue.  Rapid heartbeat.  Fainting, in severe cases. How is this diagnosed? This condition is diagnosed based on:  Your medical history.  Your symptoms.  Your blood pressure measurement. Your health care provider will check your blood pressure when you are:  Lying down.  Sitting.  Standing. A blood pressure reading is recorded as two numbers, such as "120 over 80" (or 120/80). The first ("top") number is called the systolic pressure. It is a measure of the pressure in your arteries as your heart beats. The second ("bottom") number is called the diastolic pressure. It is a measure of the pressure in your arteries when your heart relaxes between beats. Blood pressure is measured in a unit called mm Hg. Healthy blood pressure for adults is 120/80. If your blood pressure is below 90/60, you may be diagnosed with hypotension. Other information or tests that may be used to diagnose hypotension include:  Your other vital signs, such as your heart rate and temperature.  Blood tests.  Tilt table test. For this test, you will be safely secured to a table that moves you from a lying position to an  upright position. Your heart rhythm and blood pressure will be monitored during the test. How is this treated? Treatment for this condition may include:  Changing your diet. This may involve eating more salt (sodium) or drinking more water.  Taking medicines to raise your blood pressure.  Changing the dosage of certain medicines you are taking that might be lowering your blood pressure.  Wearing compression stockings. These stockings help to prevent blood clots and reduce swelling in your legs. In some cases, you may need to go to the hospital for:  Fluid replacement. This means you will receive fluids through an IV tube.  Blood replacement. This means you will receive donated blood through an IV tube (transfusion).  Treating an infection or heart problems, if this applies.  Monitoring. You may need to be monitored while medicines that you are taking wear off. Follow these instructions at  home: Eating and drinking    Drink enough fluid to keep your urine clear or pale yellow.  Eat a healthy diet and follow instructions from your health care provider about eating or drinking restrictions. A healthy diet includes:  Fresh fruits and vegetables.  Whole grains.  Lean meats.  Low-fat dairy products.  Eat extra salt only as directed. Do not add extra salt to your diet unless your health care provider told you to do that.  Eat frequent, small meals.  Avoid standing up suddenly after eating. Medicines   Take over-the-counter and prescription medicines only as told by your health care provider.  Follow instructions from your health care provider about changing the dosage of your current medicines, if this applies.  Do not stop or adjust any of your medicines on your own. General instructions   Wear compression stockings as told by your health care provider.  Get up slowly from lying down or sitting positions. This gives your blood pressure a chance to adjust.  Avoid hot  showers and excessive heat as directed by your health care provider.  Return to your normal activities as told by your health care provider. Ask your health care provider what activities are safe for you.  Do not use any products that contain nicotine or tobacco, such as cigarettes and e-cigarettes. If you need help quitting, ask your health care provider.  Keep all follow-up visits as told by your health care provider. This is important. Contact a health care provider if:  You vomit.  You have diarrhea.  You have a fever for more than 2-3 days.  You feel more thirsty than usual.  You feel weak and tired. Get help right away if:  You have chest pain.  You have a fast or irregular heartbeat.  You develop numbness in any part of your body.  You cannot move your arms or your legs.  You have trouble speaking.  You become sweaty or feel light-headed.  You faint.  You feel short of breath.  You have trouble staying awake.  You feel confused. This information is not intended to replace advice given to you by your health care provider. Make sure you discuss any questions you have with your health care provider. Document Released: 10/02/2005 Document Revised: 04/21/2016 Document Reviewed: 03/23/2016 Elsevier Interactive Patient Education  2017 [employer].

## 2016-12-28 NOTE — Progress Notes (Signed)
Subjective:  Patient ID: Tamara Russell, female    DOB: 10/04/1961  Age: 56 y.o. MRN: [id number]  CC: Hypertension   HPI [facility] presents for   HTN: Reports adherence with blood pressure medications. Denies any CP, SOB, or BLE swelling. Expresses desire to lose weight. Reports unhealthy diet and lack of time to exercise as barriers. BP found to be elevated this office visit. She request repeat BP.   History of vision impairment: Reports wearing corrective lenses for 10 years. Last opthalmologic exam was 4 years ago. Report her glasses broke and has been without them for 6 months.    Outpatient Medications Prior to Visit  Medication Sig Dispense Refill  . acetaminophen (TYLENOL) 500 MG tablet Take 1,000 mg by mouth every 6 (six) hours as needed for moderate pain. Reported on 04/21/2016    . cholecalciferol (VITAMIN D) 1000 UNITS tablet Take 1,000 Units by mouth daily.    Marland Kitchen ibuprofen (ADVIL,MOTRIN) 800 MG tablet Take 1 tablet (800 mg total) by mouth every 8 (eight) hours as needed (Take with food.). 30 tablet 0  . Menthol (HALLS COUGH DROPS MT) Use as directed 1 lozenge in the mouth or throat every 3 (three) hours as needed (for sore throat). Reported on 04/21/2016    . Multiple Vitamins-Minerals (CENTRUM SILVER ADULT 50+ PO) Take 1 tablet by mouth daily.    Marland Kitchen amLODipine (NORVASC) 10 MG tablet Take 1 tablet (10 mg total) by mouth daily. 30 tablet 2  . losartan-hydrochlorothiazide (HYZAAR) 100-25 MG tablet Take 1 tablet by mouth daily. Needs office visit for refills 30 tablet 2   No facility-administered medications prior to visit.     ROS Review of Systems  Eyes: Negative.   Respiratory: Negative.   Cardiovascular: Negative.   Gastrointestinal: Negative.   Skin: Negative.     Objective:  BP 137/87 (BP Location: Left Arm, Cuff Size: Large)   Pulse 72   Temp 97.9 F (36.6 C) (Oral)   Resp 18   Ht 5\' 1"  (1.549 m)   Wt 241 lb 12.8 oz (109.7 kg)   SpO2 93%   BMI  45.69 kg/m   BP/Weight 12/28/2016 10/02/2016 09/22/2016  Systolic BP 137 142 135  Diastolic BP 87 80 84  Wt. (Lbs) 241.8 240 -  BMI 45.69 44.61 -    Physical Exam  Eyes: Pupils are equal, round, and reactive to light.  Neck: No JVD present.  Cardiovascular: Normal rate, regular rhythm, normal heart sounds and intact distal pulses.   Pulmonary/Chest: Effort normal and breath sounds normal.  Abdominal: Soft. Bowel sounds are normal.  Skin: Skin is warm and dry.  Nursing note and vitals reviewed.   Assessment & Plan:   Problem List Items Addressed This Visit      Cardiovascular and Mediastinum   HTN (hypertension) - Primary   -Inc.dosage of HTCZ for better BP control. Patient given hypotensive precautions.   -Recommend follow up BP check with RN in 2 weeks. If BP is greater than 90/60 (MAP 65 or    greater) but not less than 130/80 may add coreg 3.125 QD and recheck in another 2 weeks.   -Given information on healthy diet and exercise.    Relevant Medications   amLODipine (NORVASC) 10 MG tablet   losartan (COZAAR) 100 MG tablet   hydrochlorothiazide (HYDRODIURIL) 50 MG tablet    Other Visit Diagnoses    Vision impairment       Relevant Orders   Ambulatory referral to  Ophthalmology   Wears glasses       Relevant Orders   Ambulatory referral to Ophthalmology      Meds ordered this encounter  Medications  . amLODipine (NORVASC) 10 MG tablet    Sig: Take 1 tablet (10 mg total) by mouth daily.    Dispense:  30 tablet    Refill:  2    Order Specific Question:   Supervising Provider    Answer:   Quentin AngstJEGEDE, OLUGBEMIGA E [id number]  . losartan (COZAAR) 100 MG tablet    Sig: Take 1 tablet (100 mg total) by mouth daily.    Dispense:  30 tablet    Refill:  2    Order Specific Question:   Supervising Provider    Answer:   Quentin AngstJEGEDE, OLUGBEMIGA E [id number]  . hydrochlorothiazide (HYDRODIURIL) 50 MG tablet    Sig: Take 1 tablet (50 mg total) by mouth daily.    Dispense:  30 tablet      Refill:  2    Order Specific Question:   Supervising Provider    Answer:   Quentin AngstJEGEDE, OLUGBEMIGA E [id number]    Follow-up: Return in about 2 weeks (around 01/11/2017) for Blood pressure check with RN.   Lizbeth BarkMandesia R Lord Lancour FNP

## 2017-01-02 ENCOUNTER — Other Ambulatory Visit: Payer: Self-pay | Admitting: Family Medicine

## 2017-01-03 ENCOUNTER — Telehealth: Payer: Self-pay

## 2017-01-03 NOTE — Telephone Encounter (Signed)
-----   Message from Lizbeth BarkMandesia R Hairston, FNP sent at 01/02/2017  1:47 PM EDT ----- Please notify patient  that she does not meet the criteria for a handicap placard at this time.

## 2017-01-03 NOTE — Telephone Encounter (Signed)
CMA call to inform patient that she does not meet the criteria of a handicap placecard  Patient was aware and understood

## 2017-01-16 ENCOUNTER — Ambulatory Visit: Payer: BLUE CROSS/BLUE SHIELD | Attending: Family Medicine | Admitting: *Deleted

## 2017-01-16 VITALS — BP 113/80 | HR 70

## 2017-01-16 DIAGNOSIS — I10 Essential (primary) hypertension: Secondary | ICD-10-CM | POA: Diagnosis not present

## 2017-01-16 NOTE — Progress Notes (Signed)
Pt here for BP check.  Pt states she feels dizzy and light headed occasionally after taking medication. Has not noticed a lot of urine output since dose change as she did before.She has taken medication for 2 weeks.   Denies chest pain, SOB, blurred vision or swelling.   Will route nurse visit to provider.Guy Franco, RN, BSN

## 2017-01-25 MED FILL — HYDROCHLOROTHIAZIDE 25 MG T: 25 | 30 days supply | Qty: 60 | Fill #1

## 2017-01-25 MED FILL — AMLODIPINE BESYLATE 10 MG T: 10 | 30 days supply | Qty: 30 | Fill #1

## 2017-02-26 MED FILL — HYDROCHLOROTHIAZIDE 25 MG T: 25 | 30 days supply | Qty: 60 | Fill #2

## 2017-02-26 MED FILL — LOSARTAN POTASSIUM 100 MG T: 100 | 30 days supply | Qty: 30 | Fill #1

## 2017-02-26 MED FILL — AMLODIPINE BESYLATE 10 MG T: 10 | 30 days supply | Qty: 30 | Fill #2

## 2017-03-05 ENCOUNTER — Encounter: Payer: Self-pay | Admitting: Family Medicine

## 2017-03-05 ENCOUNTER — Ambulatory Visit: Payer: BLUE CROSS/BLUE SHIELD | Attending: Family Medicine | Admitting: Family Medicine

## 2017-03-05 VITALS — BP 122/78 | HR 70 | Temp 98.4°F | Resp 18 | Ht 61.0 in | Wt 235.4 lb

## 2017-03-05 DIAGNOSIS — M545 Low back pain: Secondary | ICD-10-CM | POA: Insufficient documentation

## 2017-03-05 DIAGNOSIS — I10 Essential (primary) hypertension: Secondary | ICD-10-CM | POA: Insufficient documentation

## 2017-03-05 DIAGNOSIS — Z79899 Other long term (current) drug therapy: Secondary | ICD-10-CM | POA: Insufficient documentation

## 2017-03-05 DIAGNOSIS — G8929 Other chronic pain: Secondary | ICD-10-CM | POA: Diagnosis not present

## 2017-03-05 DIAGNOSIS — R252 Cramp and spasm: Secondary | ICD-10-CM | POA: Diagnosis not present

## 2017-03-05 DIAGNOSIS — Z6841 Body Mass Index (BMI) 40.0 and over, adult: Secondary | ICD-10-CM | POA: Diagnosis not present

## 2017-03-05 DIAGNOSIS — Z0000 Encounter for general adult medical examination without abnormal findings: Secondary | ICD-10-CM | POA: Diagnosis not present

## 2017-03-05 MED ORDER — NAPROXEN 500 MG PO TABS: 500.0000 mg | ORAL_TABLET | Freq: Two times a day (BID) | ORAL | 1 refills | Status: DC

## 2017-03-05 MED ORDER — AMLODIPINE BESYLATE 10 MG PO TABS: 10.0000 mg | ORAL_TABLET | Freq: Every day | ORAL | 0 refills | Status: DC

## 2017-03-05 MED ORDER — LOSARTAN POTASSIUM 100 MG PO TABS: 100.0000 mg | ORAL_TABLET | Freq: Every day | ORAL | 0 refills | Status: DC

## 2017-03-05 MED ORDER — HYDROCHLOROTHIAZIDE 25 MG PO TABS: 25.0000 mg | ORAL_TABLET | Freq: Every day | ORAL | 0 refills | Status: DC

## 2017-03-05 MED FILL — NAPROXEN 500 MG TABLET: 500 | 15 days supply | Qty: 30 | Fill #0

## 2017-03-05 NOTE — Patient Instructions (Addendum)
You will be called with your labs results.  Serving Sizes A serving size is a measured amount of food or drink, such as one slice of bread, that has an associated nutrient content. Knowing the serving size of a food or drink can help you determine how much of that food you should consume. What is the size of one serving? The size of one healthy serving depends on the food or drink. To determine a serving size, read the food label. If the food or drink does not have a food label, try to find serving size information online. Or, use the following to estimate the size of one adult serving: Grain  1 slice bread.  bagel.  cup pasta. Vegetable   cup cooked or canned vegetables. 1 cup raw, leafy greens. Fruit   cup canned fruit. 1 medium fruit.  cup dried fruit. Meat and Other Protein Sources  1 oz meat, poultry, or fish.  cup cooked beans. 1 egg.  cup nuts or seeds. 1 Tbsp nut butter.  cup tofu or tempeh. 2 Tbsp hummus. Dairy  An individual container of yogurt (6-8 oz). 1 piece of cheese the size of your thumb (1 oz). 1 cup (8 oz) milk or milk alternative. Fat  A piece the size of one dice. 1 tsp soft margarine. 1 Tbsp mayonnaise. 1 tsp vegetable oil. 1 Tbsp regular salad dressing. 2 Tbsp low-fat salad dressing. How many servings should I eat from each food group each day? The following are the suggested number of servings to try and have every day from each food group. You can also look at your eating throughout the week and aim for meeting these requirements on most days for overall healthy eating. Grain  6-8 servings. Try to have half of your grains from whole grains, such as whole wheat bread, corn tortillas, oatmeal, brown rice, whole wheat pasta, and bulgur. Vegetable  At least 2-3 servings. Fruit  2 servings. Meat and Other Protein Foods  5-6 servings. Aim to have lean proteins, such as chicken, [place], fish, beans, or tofu. Dairy  3 servings. Choose low-fat or nonfat if you  are trying to control your weight. Fat  2-3 servings. Is a serving the same thing as a portion? No. A portion is the actual amount you eat, which may be more than one serving. Knowing the specific serving size of a food and the nutritional information that goes with it can help you make a healthy decision on what size portion to eat. What are some tips to help me learn healthy serving sizes?  Check food labels for serving sizes. Many foods that come as a single portion actually contain multiple servings.  Determine the serving size of foods you commonly eat and figure out how large a portion you usually eat.  Measure the number of servings that can be held by the bowls, glasses, cups, and plates you typically use. For example, pour your breakfast cereal into your regular bowl and then pour it into a measuring cup.  For 1-2 days, measure the serving sizes of all the foods you eat.  Practice estimating serving sizes and determining how big your portions should be. This information is not intended to replace advice given to you by your health care provider. Make sure you discuss any questions you have with your health care provider. Document Released: 07/01/2003 Document Revised: 05/27/2016 Document Reviewed: 12/30/2013 Elsevier Interactive Patient Education  2017 [employer]. Exercising to [employer] Exercising can help you to  lose weight. In order to lose weight through exercise, you need to do vigorous-intensity exercise. You can tell that you are exercising with vigorous intensity if you are breathing very hard and fast and cannot hold a conversation while exercising. Moderate-intensity exercise helps to maintain your current weight. You can tell that you are exercising at a moderate level if you have a higher heart rate and faster breathing, but you are still able to hold a conversation. How often should I exercise? Choose an activity that you enjoy and set realistic goals. Your health  care provider can help you to make an activity plan that works for you. Exercise regularly as directed by your health care provider. This may include:  Doing resistance training twice each week, such as:  Push-ups.  Sit-ups.  Lifting weights.  Using resistance bands.  Doing a given intensity of exercise for a given amount of time. Choose from these options:  150 minutes of moderate-intensity exercise every week.  75 minutes of vigorous-intensity exercise every week.  A mix of moderate-intensity and vigorous-intensity exercise every week. Children, pregnant women, people who are out of shape, people who are overweight, and older adults may need to consult a health care provider for individual recommendations. If you have any sort of medical condition, be sure to consult your health care provider before starting a new exercise program. What are some activities that can help me to lose weight?  Walking at a rate of at least 4.5 miles an hour.  Jogging or running at a rate of 5 miles per hour.  Biking at a rate of at least 10 miles per hour.  Lap swimming.  Roller-skating or in-line skating.  Cross-country skiing.  Vigorous competitive sports, such as football, basketball, and soccer.  Jumping rope.  Aerobic dancing. How can I be more active in my day-to-day activities?  Use the stairs instead of the elevator.  Take a walk during your lunch break.  If you drive, park your car farther away from work or school.  If you take public transportation, get off one stop early and walk the rest of the way.  Make all of your phone calls while standing up and walking around.  Get up, stretch, and walk around every 30 minutes throughout the day. What guidelines should I follow while exercising?  Do not exercise so much that you hurt yourself, feel dizzy, or get very short of breath.  Consult your health care provider prior to starting a new exercise program.  Wear comfortable  clothes and shoes with good support.  Drink plenty of water while you exercise to prevent dehydration or heat stroke. Body water is lost during exercise and must be replaced.  Work out until you breathe faster and your heart beats faster. This information is not intended to replace advice given to you by your health care provider. Make sure you discuss any questions you have with your health care provider. Document Released: 11/04/2010 Document Revised: 03/09/2016 Document Reviewed: 03/05/2014 Elsevier Interactive Patient Education  2017 Elsevier Inc.  Naproxen delayed-release tablets What is this medicine? NAPROXEN (na PROX en) is a non-steroidal anti-inflammatory drug (NSAID). It is used to reduce swelling and to treat pain. This medicine may be used for dental pain, headache, or painful monthly periods. It is also used for painful joint and muscular problems such as arthritis, tendinitis, bursitis, and gout. This medicine may be used for other purposes; ask your health care provider or pharmacist if you have questions. COMMON BRAND  NAME(S): EC-Naprosyn What should I tell my health care provider before I take this medicine? They need to know if you have any of these conditions: -asthma -cigarette smoker -drink more than 3 alcohol containing drinks a day -heart disease or circulation problems such as heart failure or leg edema (fluid retention) -high blood pressure -kidney disease -liver disease -stomach bleeding or ulcers -an unusual or allergic reaction to naproxen, aspirin, other NSAIDs, other medicines, foods, dyes, or preservatives -pregnant or trying to get pregnant -breast-feeding How should I use this medicine? Take this medicine by mouth with a glass of water. Follow the directions on the prescription label. Do not cut, crush or chew this medicine. Take it with food if your stomach gets upset. Try to not lie down for at least 10 minutes after you take it. Take your medicine at  regular intervals. Do not take your medicine more often than directed. Long-term, continuous use may increase the risk of heart attack or stroke. A special MedGuide will be given to you by the pharmacist with each prescription and refill. Be sure to read this information carefully each time. Talk to your pediatrician regarding the use of this medicine in children. Special care may be needed. Overdosage: If you think you have taken too much of this medicine contact a poison control center or emergency room at once. NOTE: This medicine is only for you. Do not share this medicine with others. What if I miss a dose? If you miss a dose, take it as soon as you can. If it is almost time for your next dose, take only that dose. Do not take double or extra doses. What may interact with this medicine? -alcohol -antacids -aspirin -cidofovir -diuretics -lithium -medicines for stomach, or intestine problems, like acid reflux or GERD -methotrexate -other drugs for inflammation like ketorolac or prednisone -pemetrexed -probenecid -sucralfate -warfarin This list may not describe all possible interactions. Give your health care provider a list of all the medicines, herbs, non-prescription drugs, or dietary supplements you use. Also tell them if you smoke, drink alcohol, or use illegal drugs. Some items may interact with your medicine. What should I watch for while using this medicine? Tell your doctor or health care professional if your pain does not get better. Talk to your doctor before taking another medicine for pain. Do not treat yourself. This medicine does not prevent heart attack or stroke. In fact, this medicine may increase the chance of a heart attack or stroke. The chance may increase with longer use of this medicine and in people who have heart disease. If you take aspirin to prevent heart attack or stroke, talk with your doctor or health care professional. Do not take other medicines that  contain aspirin, ibuprofen, or naproxen with this medicine. Side effects such as stomach upset, nausea, or ulcers may be more likely to occur. Many medicines available without a prescription should not be taken with this medicine. This medicine can cause ulcers and bleeding in the stomach and intestines at any time during treatment. Do not smoke cigarettes or drink alcohol. These increase irritation to your stomach and can make it more susceptible to damage from this medicine. Ulcers and bleeding can happen without warning symptoms and can cause death. You may get drowsy or dizzy. Do not drive, use machinery, or do anything that needs mental alertness until you know how this medicine affects you. Do not stand or sit up quickly, especially if you are an older patient. This reduces the  risk of dizzy or fainting spells. This medicine can cause you to bleed more easily. Try to avoid damage to your teeth and gums when you brush or floss your teeth. What side effects may I notice from receiving this medicine? Side effects that you should report to your doctor or health care professional as soon as possible: -black or bloody stools, blood in the urine or vomit -blurred vision -chest pain -difficulty breathing or wheezing -nausea or vomiting -skin rash, skin redness, blistering or peeling skin, hives, or itching -slurred speech or weakness on one side of the body -swelling of eyelids, throat, lips -unexplained weight gain or swelling -unusually weak or tired -yellowing of eyes or skin Side effects that usually do not require medical attention (report to your doctor or health care professional if they continue or are bothersome): -constipation -headache -heartburn This list may not describe all possible side effects. Call your doctor for medical advice about side effects. You may report side effects to FDA at 1-800-FDA-1088. Where should I keep my medicine? Keep out of the reach of children. Store at  room temperature between 15 and 30 degrees C (59 and 86 degrees F). Keep container tightly closed. Throw away any unused medicine after the expiration date. NOTE: This sheet is a summary. It may not cover all possible information. If you have questions about this medicine, talk to your doctor, pharmacist, or health care provider.  2018 Elsevier/Gold Standard (2009-10-04 20:20:30)

## 2017-03-05 NOTE — Progress Notes (Signed)
Patient is here for f/up  Patient has taking her medication for today   Patient has eaten for today   Patient complains shortness of breathe when walking

## 2017-03-05 NOTE — Progress Notes (Signed)
Subjective:  Patient ID: Tamara Russell, female    DOB: 01/03/1961  Age: 56 y.o. MRN: [id number]  CC: Hypertension   HPI [facility] presents for  Hypertension: Patient here for follow-up of elevated blood pressure. She is not exercising and is adherent to low salt diet.  Blood pressure She does not check blood pressures at home.  . Cardiac symptoms none. Patient denies chest pain, chest pressure/discomfort, claudication, dyspnea, lower extremity edema, orthopnea, palpitations and syncope.  Cardiovascular risk factors: hypertension, obesity (BMI >= 30 kg/m2) and sedentary lifestyle. Nutrition referral offered but patient refuses.  She states " I know what I have to do, I've got to exercise and eat right". Use of agents associated with hypertension: NSAIDS. History of target organ damage: none.  Low Back Pain: Patient complains of chronic low back pain.  The patient first noted symptoms severalmonths ago. It was related to weight gain.. The pain is rated moderate, and is located at the bilateral lumbar back. The pain is described as aching and occurs intermittently. The symptoms denies been progressive. Symptoms are exacerbated with activity.. Factors which relieve the pain include NSAIDs and rest. Other associated symptoms include no other symptoms. Previous history of symptoms: the problem is long-standing.Treatment efforts have included rest and OTC NSAIDS, with minimal relief of symptoms.   Outpatient Medications Prior to Visit  Medication Sig Dispense Refill  . cholecalciferol (VITAMIN D) 1000 UNITS tablet Take 1,000 Units by mouth daily.    . Menthol (HALLS COUGH DROPS MT) Use as directed 1 lozenge in the mouth or throat every 3 (three) hours as needed (for sore throat). Reported on 04/21/2016    . Multiple Vitamins-Minerals (CENTRUM SILVER ADULT 50+ PO) Take 1 tablet by mouth daily.    Marland Kitchen amLODipine (NORVASC) 10 MG tablet Take 1 tablet (10 mg total) by mouth daily. 30 tablet 2    . hydrochlorothiazide (HYDRODIURIL) 50 MG tablet Take 1 tablet (50 mg total) by mouth daily. 30 tablet 2  . losartan (COZAAR) 100 MG tablet Take 1 tablet (100 mg total) by mouth daily. 30 tablet 2  . acetaminophen (TYLENOL) 500 MG tablet Take 1,000 mg by mouth every 6 (six) hours as needed for moderate pain. Reported on 04/21/2016    . ibuprofen (ADVIL,MOTRIN) 800 MG tablet Take 1 tablet (800 mg total) by mouth every 8 (eight) hours as needed (Take with food.). (Patient not taking: Reported on 01/16/2017) 30 tablet 0   No facility-administered medications prior to visit.     ROS Review of Systems  Constitutional: Negative.   Respiratory: Negative.   Cardiovascular: Negative.   Gastrointestinal: Negative.   Musculoskeletal: Positive for back pain.  Skin: Negative.   Neurological: Negative.   Psychiatric/Behavioral: Negative.     Objective:  BP 122/78 (BP Location: Left Arm, Patient Position: Sitting, Cuff Size: Normal)   Pulse 70   Temp 98.4 F (36.9 C) (Oral)   Resp 18   Ht 5\' 1"  (1.549 m)   Wt 235 lb 6.4 oz (106.8 kg)   SpO2 96%   BMI 44.48 kg/m   BP/Weight 03/05/2017 01/16/2017 12/28/2016  Systolic BP 122 113 137  Diastolic BP 78 80 87  Wt. (Lbs) 235.4 - 241.8  BMI 44.48 - 45.69     Physical Exam  Constitutional: She is oriented to person, place, and time. She appears well-developed and well-nourished.  obese  Eyes: Conjunctivae are normal. Pupils are equal, round, and reactive to light.  Neck: No JVD present.  Cardiovascular:  Normal rate, regular rhythm, normal heart sounds and intact distal pulses.   Pulmonary/Chest: Effort normal and breath sounds normal.  Abdominal: Soft. Bowel sounds are normal.  Musculoskeletal:       Lumbar back: She exhibits pain.  Neurological: She is alert and oriented to person, place, and time. She has normal reflexes.  Skin: Skin is warm and dry.  Psychiatric: She has a normal mood and affect.  Nursing note and vitals  reviewed.   Assessment & Plan:   Problem List Items Addressed This Visit      Cardiovascular and Mediastinum   HTN (hypertension) - Primary   Reduced HCTZ to 25 mg po QD.  Follow up clinical pharmacist in 2weeks.  Follow up with PCP in 3 months.     Relevant Medications   hydrochlorothiazide (HYDRODIURIL) 25 MG tablet   amLODipine (NORVASC) 10 MG tablet   losartan (COZAAR) 100 MG tablet   Other Relevant Orders   Basic metabolic panel (Completed)    Other Visit Diagnoses    Chronic bilateral low back pain without sciatica       Relevant Medications   naproxen (NAPROSYN) 500 MG tablet   Cramping of feet       Reduced HCTZ to 25 mg po QD.   Relevant Medications   naproxen (NAPROSYN) 500 MG tablet   Other Relevant Orders   Basic metabolic panel (Completed)   Class 3 severe obesity due to excess calories without serious comorbidity with body mass index (BMI) of 40.0 to 44.9 in adult [facility](HCC)       Patient declines referral to nutrition services at this time.   Healthcare maintenance       Relevant Orders   MM SCREENING BREAST TOMO BILATERAL   Ambulatory referral to Gastroenterology      Meds ordered this encounter  Medications  . hydrochlorothiazide (HYDRODIURIL) 25 MG tablet    Sig: Take 1 tablet (25 mg total) by mouth daily.    Dispense:  90 tablet    Refill:  0    Order Specific Question:   Supervising Provider    Answer:   Quentin AngstJEGEDE, OLUGBEMIGA E [id number]]  . amLODipine (NORVASC) 10 MG tablet    Sig: Take 1 tablet (10 mg total) by mouth daily.    Dispense:  90 tablet    Refill:  0    Order Specific Question:   Supervising Provider    Answer:   Quentin AngstJEGEDE, OLUGBEMIGA E [id number]]  . losartan (COZAAR) 100 MG tablet    Sig: Take 1 tablet (100 mg total) by mouth daily.    Dispense:  90 tablet    Refill:  0    Order Specific Question:   Supervising Provider    Answer:   Quentin AngstJEGEDE, OLUGBEMIGA E [id number]]  . naproxen (NAPROSYN) 500 MG tablet    Sig: Take 1 tablet (500 mg total)  by mouth 2 (two) times daily with a meal. For 7 days. Then once daily with meals as needed.    Dispense:  30 tablet    Refill:  1    Order Specific Question:   Supervising Provider    Answer:   Quentin AngstJEGEDE, OLUGBEMIGA E [[id number]]    Follow-up: Return in about 2 weeks (around 03/19/2017) for HTN/BP check with clinical pharmacist.    Lizbeth BarkMandesia R Filip Luten FNP

## 2017-03-06 LAB — BASIC METABOLIC PANEL
BUN/Creatinine Ratio: 11 (ref 9–23)
BUN: 7 mg/dL (ref 6–24)
CO2: 35 mmol/L — AB (ref 18–29)
CREATININE: 0.63 mg/dL (ref 0.57–1.00)
Calcium: 9.4 mg/dL (ref 8.7–10.2)
Chloride: 93 mmol/L — ABNORMAL LOW (ref 96–106)
GFR calc Af Amer: 114 mL/min/{1.73_m2} (ref >59)
GFR, EST NON AFRICAN AMERICAN: 99 mL/min/{1.73_m2} (ref >59)
Glucose: 91 mg/dL (ref 65–99)
Potassium: 3.6 mmol/L (ref 3.5–5.2)
SODIUM: 140 mmol/L (ref 134–144)

## 2017-03-08 ENCOUNTER — Telehealth: Payer: Self-pay | Admitting: Family Medicine

## 2017-03-08 NOTE — Telephone Encounter (Signed)
Pt. Called stating that her PCP had told her to cut pill in half. Pt. Stated she does not remember what medication her PCP had told her to cut in half. Please f/u with pt.

## 2017-03-08 NOTE — Telephone Encounter (Signed)
Pt. Called stating that her PCP had told her to cut pill in half. Pt. Stated she does not remember what medication her PCP had told her to cut in half. Please f/u with pt.

## 2017-03-09 NOTE — Telephone Encounter (Signed)
Hydrochlorothiazide was reduced. She should be taking Hydrochlorothiazide (HYDRODIURIL) 25 MG tablet once daily by mouth. If she still has Hydrochlorothiazide (HYDRODIURIL) 50 MG available she should be taking 1/2 of this tablet to total 25 mg QD.

## 2017-03-14 NOTE — Telephone Encounter (Signed)
Patient return CMA call   Patient was aware and understood  

## 2017-03-14 NOTE — Telephone Encounter (Signed)
-----   Message from Lizbeth BarkMandesia R Hairston, [place]NP sent at 03/09/2017 11:08 AM EDT ----- Potasium and calcium levels are normal. When potassium and calcium levels are low it can cause muscle cramps and weakness. Kidney function normal

## 2017-03-14 NOTE — Telephone Encounter (Signed)
CMA call regarding lab results & medication advice   Patient did not answer but left a VM stating the reason of the call & to call me back

## 2017-03-28 ENCOUNTER — Other Ambulatory Visit: Payer: Self-pay | Admitting: Family Medicine

## 2017-03-28 DIAGNOSIS — I10 Essential (primary) hypertension: Secondary | ICD-10-CM

## 2017-03-28 MED FILL — LOSARTAN POTASSIUM 100 MG T: 100 | 30 days supply | Qty: 30 | Fill #2

## 2017-03-30 ENCOUNTER — Ambulatory Visit: Payer: BLUE CROSS/BLUE SHIELD | Attending: Family Medicine | Admitting: Family Medicine

## 2017-03-30 DIAGNOSIS — Z0130 Encounter for examination of blood pressure without abnormal findings: Secondary | ICD-10-CM | POA: Insufficient documentation

## 2017-03-30 DIAGNOSIS — Z79899 Other long term (current) drug therapy: Secondary | ICD-10-CM | POA: Insufficient documentation

## 2017-03-30 DIAGNOSIS — I10 Essential (primary) hypertension: Secondary | ICD-10-CM | POA: Diagnosis not present

## 2017-03-30 MED ORDER — HYDROCHLOROTHIAZIDE 25 MG PO TABS: 25.0000 mg | ORAL_TABLET | Freq: Every day | ORAL | 2 refills | Status: DC

## 2017-03-30 MED ORDER — AMLODIPINE BESYLATE 10 MG PO TABS: 10.0000 mg | ORAL_TABLET | Freq: Every day | ORAL | 2 refills | Status: DC

## 2017-03-30 MED ORDER — LOSARTAN POTASSIUM 100 MG PO TABS: 100.0000 mg | ORAL_TABLET | Freq: Every day | ORAL | 2 refills | Status: DC

## 2017-03-30 MED FILL — AMLODIPINE BESYLATE 10 MG T: 10 | 90 days supply | Qty: 90 | Fill #0

## 2017-03-30 MED FILL — HYDROCHLOROTHIAZIDE 25 MG T: 25 | 90 days supply | Qty: 90 | Fill #0

## 2017-03-30 NOTE — Progress Notes (Signed)
Subjective:  Patient ID: Tamara Russell, female    DOB: 1960-11-03  Age: 56 y.o. MRN: [id number]  CC: Follow-up   [facility]. Patient here for follow-up of elevated blood pressure. She is not exercising and is adherent to low salt diet.  Blood pressure She does not check blood pressures at home.  . Cardiac symptoms none. Patient denies chest pain, chest pressure/discomfort, claudication, dyspnea, lower extremity edema, orthopnea, palpitations and near syncope.  Cardiovascular risk factors: hypertension, obesity (BMI >= 30 kg/m2) and sedentary lifestyle. Nutrition referral offered but patient refuses.  She states " I know what I have to do, I've got to exercise and eat right". Use of agents associated with hypertension: NSAIDS. History of target organ damage: none.    Outpatient Medications Prior to Visit  Medication Sig Dispense Refill  . acetaminophen (TYLENOL) 500 MG tablet Take 1,000 mg by mouth every 6 (six) hours as needed for moderate pain. Reported on 04/21/2016    . cholecalciferol (VITAMIN D) 1000 UNITS tablet Take 1,000 Units by mouth daily.    Marland Kitchen. ibuprofen (ADVIL,MOTRIN) 800 MG tablet Take 1 tablet (800 mg total) by mouth every 8 (eight) hours as needed (Take with food.). (Patient not taking: Reported on 01/16/2017) 30 tablet 0  . Menthol (HALLS COUGH DROPS MT) Use as directed 1 lozenge in the mouth or throat every 3 (three) hours as needed (for sore throat). Reported on 04/21/2016    . Multiple Vitamins-Minerals (CENTRUM SILVER ADULT 50+ PO) Take 1 tablet by mouth daily.    . naproxen (NAPROSYN) 500 MG tablet Take 1 tablet (500 mg total) by mouth 2 (two) times daily with a meal. For 7 days. Then once daily with meals as needed. 30 tablet 1  . amLODipine (NORVASC) 10 MG tablet Take 1 tablet (10 mg total) by mouth daily. 90 tablet 0  . hydrochlorothiazide (HYDRODIURIL) 25 MG tablet Take 1 tablet (25 mg total) by mouth daily. 90 tablet 0  .  losartan (COZAAR) 100 MG tablet Take 1 tablet (100 mg total) by mouth daily. 90 tablet 0   No facility-administered medications prior to visit.     ROS Review of Systems  Constitutional: Negative.   Respiratory: Negative.   Cardiovascular: Negative.   Neurological: Negative.    Objective:  BP 119/76 (BP Location: Left Arm, Patient Position: Sitting, Cuff Size: Normal)   Pulse 76   Temp 98.2 F (36.8 C) (Oral)   Resp 18   Ht 5\' 1"  (1.549 m)   Wt 235 lb 3.2 oz (106.7 kg)   SpO2 90%   BMI 44.44 kg/m   BP/Weight 03/30/2017 03/05/2017 01/16/2017  Systolic BP 119 122 113  Diastolic BP 76 78 80  Wt. (Lbs) 235.2 235.4 -  BMI 44.44 44.48 -    Physical Exam  Constitutional: She is oriented to person, place, and time. She appears well-developed and well-nourished.  Cardiovascular: Normal rate, regular rhythm, normal heart sounds and intact distal pulses.   Pulmonary/Chest: Effort normal and breath sounds normal.  Neurological: She is alert and oriented to person, place, and time.  Skin: Skin is warm and dry.  Nursing note and vitals reviewed.   Assessment & Plan:   Problem List Items Addressed This Visit      Cardiovascular and Mediastinum   HTN (hypertension)   BP well controlled on current medication dosage.   Relevant Medications   losartan (COZAAR) 100 MG tablet   amLODipine (NORVASC) 10 MG  tablet   hydrochlorothiazide (HYDRODIURIL) 25 MG tablet      Meds ordered this encounter  Medications  . losartan (COZAAR) 100 MG tablet    Sig: Take 1 tablet (100 mg total) by mouth daily.    Dispense:  30 tablet    Refill:  2    Order Specific Question:   Supervising Provider    Answer:   Quentin Angst [id number]  . amLODipine (NORVASC) 10 MG tablet    Sig: Take 1 tablet (10 mg total) by mouth daily.    Dispense:  30 tablet    Refill:  2    Order Specific Question:   Supervising Provider    Answer:   Quentin Angst [id number]  . hydrochlorothiazide  (HYDRODIURIL) 25 MG tablet    Sig: Take 1 tablet (25 mg total) by mouth daily.    Dispense:  30 tablet    Refill:  2    Order Specific Question:   Supervising Provider    Answer:   Quentin Angst [[id number]]      Lizbeth Bark FNP

## 2017-03-30 NOTE — Patient Instructions (Signed)
DASH Eating Plan DASH stands for "Dietary Approaches to Stop Hypertension." The DASH eating plan is a healthy eating plan that has been shown to reduce high blood pressure (hypertension). It may also reduce your risk for type 2 diabetes, heart disease, and stroke. The DASH eating plan may also help with weight loss. What are tips for following this plan? General guidelines  Avoid eating more than 2,300 mg (milligrams) of salt (sodium) a day. If you have hypertension, you may need to reduce your sodium intake to 1,500 mg a day.  Limit alcohol intake to no more than 1 drink a day for nonpregnant women and 2 drinks a day for men. One drink equals 12 oz of beer, 5 oz of wine, or 1 oz of hard liquor.  Work with your health care provider to maintain a healthy body weight or to lose weight. Ask what an ideal weight is for you.  Get at least 30 minutes of exercise that causes your heart to beat faster (aerobic exercise) most days of the week. Activities may include walking, swimming, or biking.  Work with your health care provider or diet and nutrition specialist (dietitian) to adjust your eating plan to your individual calorie needs. Reading food labels  Check food labels for the amount of sodium per serving. Choose foods with less than 5 percent of the Daily Value of sodium. Generally, foods with less than 300 mg of sodium per serving fit into this eating plan.  To find whole grains, look for the word "whole" as the first word in the ingredient list. Shopping  Buy products labeled as "low-sodium" or "no salt added."  Buy fresh foods. Avoid canned foods and premade or frozen meals. Cooking  Avoid adding salt when cooking. Use salt-free seasonings or herbs instead of table salt or sea salt. Check with your health care provider or pharmacist before using salt substitutes.  Do not fry foods. Cook foods using healthy methods such as baking, boiling, grilling, and broiling instead.  Cook with  heart-healthy oils, such as olive, canola, soybean, or sunflower oil. Meal planning   Eat a balanced diet that includes: ? 5 or more servings of fruits and vegetables each day. At each meal, try to fill half of your plate with fruits and vegetables. ? Up to 6-8 servings of whole grains each day. ? Less than 6 oz of lean meat, poultry, or fish each day. A 3-oz serving of meat is about the same size as a deck of cards. One egg equals 1 oz. ? 2 servings of low-fat dairy each day. ? A serving of nuts, seeds, or beans 5 times each week. ? Heart-healthy fats. Healthy fats called Omega-3 fatty acids are found in foods such as flaxseeds and coldwater fish, like sardines, salmon, and mackerel.  Limit how much you eat of the following: ? Canned or prepackaged foods. ? Food that is high in trans fat, such as fried foods. ? Food that is high in saturated fat, such as fatty meat. ? Sweets, desserts, sugary drinks, and other foods with added sugar. ? Full-fat dairy products.  Do not salt foods before eating.  Try to eat at least 2 vegetarian meals each week.  Eat more home-cooked food and less restaurant, buffet, and fast food.  When eating at a restaurant, ask that your food be prepared with less salt or no salt, if possible. What foods are recommended? The items listed may not be a complete list. Talk with your dietitian about what   dietary choices are best for you. Grains Whole-grain or whole-wheat bread. Whole-grain or whole-wheat pasta. Brown rice. Oatmeal. Quinoa. Bulgur. Whole-grain and low-sodium cereals. Pita bread. Low-fat, low-sodium crackers. Whole-wheat flour tortillas. Vegetables Fresh or frozen vegetables (raw, steamed, roasted, or grilled). Low-sodium or reduced-sodium tomato and vegetable juice. Low-sodium or reduced-sodium tomato sauce and tomato paste. Low-sodium or reduced-sodium canned vegetables. Fruits All fresh, dried, or frozen fruit. Canned fruit in natural juice (without  added sugar). Meat and other protein foods Skinless chicken or turkey. Ground chicken or turkey. Pork with fat trimmed off. Fish and seafood. Egg whites. Dried beans, peas, or lentils. Unsalted nuts, nut butters, and seeds. Unsalted canned beans. Lean cuts of beef with fat trimmed off. Low-sodium, lean deli meat. Dairy Low-fat (1%) or fat-free (skim) milk. Fat-free, low-fat, or reduced-fat cheeses. Nonfat, low-sodium ricotta or cottage cheese. Low-fat or nonfat yogurt. Low-fat, low-sodium cheese. Fats and oils Soft margarine without trans fats. Vegetable oil. Low-fat, reduced-fat, or light mayonnaise and salad dressings (reduced-sodium). Canola, safflower, olive, soybean, and sunflower oils. Avocado. Seasoning and other foods Herbs. Spices. Seasoning mixes without salt. Unsalted popcorn and pretzels. Fat-free sweets. What foods are not recommended? The items listed may not be a complete list. Talk with your dietitian about what dietary choices are best for you. Grains Baked goods made with fat, such as croissants, muffins, or some breads. Dry pasta or rice meal packs. Vegetables Creamed or fried vegetables. Vegetables in a cheese sauce. Regular canned vegetables (not low-sodium or reduced-sodium). Regular canned tomato sauce and paste (not low-sodium or reduced-sodium). Regular tomato and vegetable juice (not low-sodium or reduced-sodium). Pickles. Olives. Fruits Canned fruit in a light or heavy syrup. Fried fruit. Fruit in cream or butter sauce. Meat and other protein foods Fatty cuts of meat. Ribs. Fried meat. Bacon. Sausage. Bologna and other processed lunch meats. Salami. Fatback. Hotdogs. Bratwurst. Salted nuts and seeds. Canned beans with added salt. Canned or smoked fish. Whole eggs or egg yolks. Chicken or turkey with skin. Dairy Whole or 2% milk, cream, and half-and-half. Whole or full-fat cream cheese. Whole-fat or sweetened yogurt. Full-fat cheese. Nondairy creamers. Whipped toppings.  Processed cheese and cheese spreads. Fats and oils Butter. Stick margarine. Lard. Shortening. Ghee. Bacon fat. Tropical oils, such as coconut, palm kernel, or palm oil. Seasoning and other foods Salted popcorn and pretzels. Onion salt, garlic salt, seasoned salt, table salt, and sea salt. Worcestershire sauce. Tartar sauce. Barbecue sauce. Teriyaki sauce. Soy sauce, including reduced-sodium. Steak sauce. Canned and packaged gravies. Fish sauce. Oyster sauce. Cocktail sauce. Horseradish that you find on the shelf. Ketchup. Mustard. Meat flavorings and tenderizers. Bouillon cubes. Hot sauce and Tabasco sauce. Premade or packaged marinades. Premade or packaged taco seasonings. Relishes. Regular salad dressings. Where to find more information:  National Heart, Lung, and Blood Institute: www.nhlbi.nih.gov  American Heart Association: www.heart.org Summary  The DASH eating plan is a healthy eating plan that has been shown to reduce high blood pressure (hypertension). It may also reduce your risk for type 2 diabetes, heart disease, and stroke.  With the DASH eating plan, you should limit salt (sodium) intake to 2,300 mg a day. If you have hypertension, you may need to reduce your sodium intake to 1,500 mg a day.  When on the DASH eating plan, aim to eat more fresh fruits and vegetables, whole grains, lean proteins, low-fat dairy, and heart-healthy fats.  Work with your health care provider or diet and nutrition specialist (dietitian) to adjust your eating plan to your individual   calorie needs. This information is not intended to replace advice given to you by your health care provider. Make sure you discuss any questions you have with your health care provider. Document Released: 09/21/2011 Document Revised: 09/25/2016 Document Reviewed: 09/25/2016 Elsevier Interactive Patient Education  2017 [employer]  

## 2017-03-30 NOTE — Progress Notes (Signed)
Patient is here for f/up HTN 

## 2017-04-23 MED FILL — LOSARTAN POTASSIUM 100 MG T: 100 | 90 days supply | Qty: 90 | Fill #0

## 2017-07-04 ENCOUNTER — Other Ambulatory Visit: Payer: Self-pay | Admitting: Family Medicine

## 2017-07-04 DIAGNOSIS — I10 Essential (primary) hypertension: Secondary | ICD-10-CM

## 2017-07-05 ENCOUNTER — Telehealth: Payer: Self-pay | Admitting: Family Medicine

## 2017-07-05 DIAGNOSIS — I10 Essential (primary) hypertension: Secondary | ICD-10-CM

## 2017-07-05 MED ORDER — AMLODIPINE BESYLATE 10 MG PO TABS: 10.0000 mg | ORAL_TABLET | Freq: Every day | ORAL | 0 refills | Status: DC

## 2017-07-05 MED FILL — AMLODIPINE BESYLATE 10 MG T: 10 | 30 days supply | Qty: 30 | Fill #0

## 2017-07-05 NOTE — Telephone Encounter (Signed)
Refilled amlodipine x 30 days - patient needs office visit for further refills.

## 2017-07-05 NOTE — Telephone Encounter (Signed)
Pt. Called requesting a refill on amLODipine (NORVASC) 10 MG tablet  Pt. Would like Rx sent to [facility] pharmacy. Please f/u

## 2017-07-09 ENCOUNTER — Encounter: Payer: Self-pay | Admitting: Family Medicine

## 2017-07-09 ENCOUNTER — Ambulatory Visit: Payer: BLUE CROSS/BLUE SHIELD | Attending: Family Medicine | Admitting: Family Medicine

## 2017-07-09 VITALS — BP 128/82 | HR 67 | Temp 98.2°F | Resp 18 | Ht 61.0 in | Wt 232.4 lb

## 2017-07-09 DIAGNOSIS — M25562 Pain in left knee: Secondary | ICD-10-CM | POA: Insufficient documentation

## 2017-07-09 DIAGNOSIS — Z6841 Body Mass Index (BMI) 40.0 and over, adult: Secondary | ICD-10-CM

## 2017-07-09 DIAGNOSIS — I10 Essential (primary) hypertension: Secondary | ICD-10-CM

## 2017-07-09 DIAGNOSIS — M25561 Pain in right knee: Secondary | ICD-10-CM | POA: Insufficient documentation

## 2017-07-09 DIAGNOSIS — M1990 Unspecified osteoarthritis, unspecified site: Secondary | ICD-10-CM | POA: Diagnosis not present

## 2017-07-09 DIAGNOSIS — Z1211 Encounter for screening for malignant neoplasm of colon: Secondary | ICD-10-CM | POA: Diagnosis not present

## 2017-07-09 DIAGNOSIS — Z1231 Encounter for screening mammogram for malignant neoplasm of breast: Secondary | ICD-10-CM

## 2017-07-09 DIAGNOSIS — Z1239 Encounter for other screening for malignant neoplasm of breast: Secondary | ICD-10-CM

## 2017-07-09 MED ORDER — LOSARTAN POTASSIUM 100 MG PO TABS: 100.0000 mg | ORAL_TABLET | Freq: Every day | ORAL | 1 refills | Status: DC

## 2017-07-09 MED ORDER — AMLODIPINE BESYLATE 10 MG PO TABS: 10.0000 mg | ORAL_TABLET | Freq: Every day | ORAL | 1 refills | Status: DC

## 2017-07-09 MED ORDER — NAPROXEN SODIUM 220 MG PO TABS: 220.0000 mg | ORAL_TABLET | Freq: Two times a day (BID) | ORAL | Status: DC

## 2017-07-09 MED ORDER — HYDROCHLOROTHIAZIDE 25 MG PO TABS: 25.0000 mg | ORAL_TABLET | Freq: Every day | ORAL | 1 refills | Status: DC

## 2017-07-09 MED FILL — LOSARTAN POTASSIUM 100 MG T: 100 | 30 days supply | Qty: 30 | Fill #0

## 2017-07-09 MED FILL — HYDROCHLOROTHIAZIDE 25 MG T: 25 | 30 days supply | Qty: 30 | Fill #0

## 2017-07-09 NOTE — Progress Notes (Signed)
Patient is here for f/up   Patient has not taking her med for today

## 2017-07-09 NOTE — Progress Notes (Signed)
Subjective:  Patient ID: Tamara Russell, female    DOB: Nov 23, 1960  Age: 56 y.o. MRN: [id number]  CC: Hypertension   HPI [facility] presents for hypertension. She is not exercising and is adherent to low salt diet. She does not check blood pressures at home.   Cardiac symptoms none. Patient denies chest pain, chest pressure/discomfort, claudication, dyspnea, lower extremity edema, orthopnea, palpitations and near syncope.  Cardiovascular risk factors: hypertension, obesity (BMI >= 30 kg/m2) and sedentary lifestyle. Patient is agreeable to nutrition referral.  She states " I know what I have to do, I've got to exercise and eat right". Use of agents associated with hypertension: NSAIDS. History of target organ damage: none. She does reports bilateral knee pain with prolonged walking and standing. She states " I know it's due to my weight". She denies any history of injury. She reports having knee braces available at home and uses OTC naprosyn for pain. She declines prescribed for naprosyn.     Outpatient Medications Prior to Visit  Medication Sig Dispense Refill  . acetaminophen (TYLENOL) 500 MG tablet Take 1,000 mg by mouth every 6 (six) hours as needed for moderate pain. Reported on 04/21/2016    . cholecalciferol (VITAMIN D) 1000 UNITS tablet Take 1,000 Units by mouth daily.    Marland Kitchen ibuprofen (ADVIL,MOTRIN) 800 MG tablet Take 1 tablet (800 mg total) by mouth every 8 (eight) hours as needed (Take with food.). (Patient not taking: Reported on 01/16/2017) 30 tablet 0  . Menthol (HALLS COUGH DROPS MT) Use as directed 1 lozenge in the mouth or throat every 3 (three) hours as needed (for sore throat). Reported on 04/21/2016    . Multiple Vitamins-Minerals (CENTRUM SILVER ADULT 50+ PO) Take 1 tablet by mouth daily.    Marland Kitchen amLODipine (NORVASC) 10 MG tablet Take 1 tablet (10 mg total) by mouth daily. 30 tablet 0  . hydrochlorothiazide (HYDRODIURIL) 25 MG tablet Take 1 tablet (25 mg total) by mouth  daily. 30 tablet 2  . losartan (COZAAR) 100 MG tablet Take 1 tablet (100 mg total) by mouth daily. 30 tablet 2  . naproxen (NAPROSYN) 500 MG tablet Take 1 tablet (500 mg total) by mouth 2 (two) times daily with a meal. For 7 days. Then once daily with meals as needed. 30 tablet 1   No facility-administered medications prior to visit.     ROS Review of Systems  Constitutional: Negative.   Respiratory: Negative.   Cardiovascular: Negative.   Musculoskeletal: Positive for arthralgias.  Skin: Negative.   Neurological: Negative.   Psychiatric/Behavioral: Negative.    Objective:  BP 128/82 (BP Location: Left Arm, Patient Position: Sitting, Cuff Size: Normal)   Pulse 67   Temp 98.2 F (36.8 C) (Oral)   Resp 18   Ht  (1.549 m)   Wt 232 lb 6.4 oz (105.4 kg)   SpO2 94%   BMI 43.91 kg/m   BP/Weight 07/09/2017 03/30/2017 03/05/2017  Systolic BP 128 119 122  Diastolic BP 82 76 78  Wt. (Lbs) 232.4 235.2 235.4  BMI 43.91 44.44 44.48    Physical Exam  Constitutional: She is oriented to person, place, and time. She appears well-developed and well-nourished.  obese   Neck: Normal range of motion. Neck supple. No JVD present.  Cardiovascular: Normal rate, regular rhythm, normal heart sounds and intact distal pulses.   Pulmonary/Chest: Effort normal and breath sounds normal.  Abdominal: Soft. Bowel sounds are normal. There is no tenderness.  Musculoskeletal:  Right knee: She exhibits normal range of motion and no swelling.       Left knee: She exhibits normal range of motion and no swelling.  Neurological: She is alert and oriented to person, place, and time.  Skin: Skin is warm and dry.  Psychiatric: She has a normal mood and affect. She expresses no homicidal and no suicidal ideation. She expresses no suicidal plans and no homicidal plans.  Nursing note and vitals reviewed.   Assessment & Plan:   1. Essential hypertension BP well controlled on current medication  dosages. - hydrochlorothiazide (HYDRODIURIL) 25 MG tablet; Take 1 tablet (25 mg total) by mouth daily.  Dispense: 90 tablet; Refill: 1 - losartan (COZAAR) 100 MG tablet; Take 1 tablet (100 mg total) by mouth daily.  Dispense: 90 tablet; Refill: 1 - amLODipine (NORVASC) 10 MG tablet; Take 1 tablet (10 mg total) by mouth daily.  Dispense: 90 tablet; Refill: 1  2. Arthritis Follow up as needed. - naproxen sodium (ALEVE) 220 MG tablet; Take 1 tablet (220 mg total) by mouth 2 (two) times daily with a meal.  3. Morbid obesity with BMI of 40.0-44.9, adult (HCC)  - Amb ref to Medical Nutrition Therapy-MNT  4. Screening for breast cancer  - MM SCREENING BREAST TOMO BILATERAL; Future  5. Screen for colon cancer  - Ambulatory referral to Gastroenterology    Meds ordered this encounter  Medications  . hydrochlorothiazide (HYDRODIURIL) 25 MG tablet    Sig: Take 1 tablet (25 mg total) by mouth daily.    Dispense:  90 tablet    Refill:  1    Order Specific Question:   Supervising Provider    Answer:   Quentin Angst [id number]  . losartan (COZAAR) 100 MG tablet    Sig: Take 1 tablet (100 mg total) by mouth daily.    Dispense:  90 tablet    Refill:  1    Order Specific Question:   Supervising Provider    Answer:   Quentin Angst [id number]  . amLODipine (NORVASC) 10 MG tablet    Sig: Take 1 tablet (10 mg total) by mouth daily.    Dispense:  90 tablet    Refill:  1    Order Specific Question:   Supervising Provider    Answer:   Quentin Angst [id number]  . naproxen sodium (ALEVE) 220 MG tablet    Sig: Take 1 tablet (220 mg total) by mouth 2 (two) times daily with a meal.    Order Specific Question:   Supervising Provider    Answer:   Quentin Angst [[id number]]    Follow up: Return in about 6 months (around 01/06/2018) for HTN .   Lizbeth Bark FNP

## 2017-07-09 NOTE — Patient Instructions (Signed)

## 2017-07-09 NOTE — Progress Notes (Signed)
   Subjective:  Patient ID: Tamara Russell, female    DOB: April 14, 1961  Age: 56 y.o. MRN: [id number]  CC: Hypertension   HPI [facility] presents for   MM screen  Colon screen   Nutrients    Outpatient Medications Prior to Visit  Medication Sig Dispense Refill  . acetaminophen (TYLENOL) 500 MG tablet Take 1,000 mg by mouth every 6 (six) hours as needed for moderate pain. Reported on 04/21/2016    . amLODipine (NORVASC) 10 MG tablet Take 1 tablet (10 mg total) by mouth daily. 30 tablet 0  . cholecalciferol (VITAMIN D) 1000 UNITS tablet Take 1,000 Units by mouth daily.    . hydrochlorothiazide (HYDRODIURIL) 25 MG tablet Take 1 tablet (25 mg total) by mouth daily. 30 tablet 2  . ibuprofen (ADVIL,MOTRIN) 800 MG tablet Take 1 tablet (800 mg total) by mouth every 8 (eight) hours as needed (Take with food.). (Patient not taking: Reported on 01/16/2017) 30 tablet 0  . losartan (COZAAR) 100 MG tablet Take 1 tablet (100 mg total) by mouth daily. 30 tablet 2  . Menthol (HALLS COUGH DROPS MT) Use as directed 1 lozenge in the mouth or throat every 3 (three) hours as needed (for sore throat). Reported on 04/21/2016    . Multiple Vitamins-Minerals (CENTRUM SILVER ADULT 50+ PO) Take 1 tablet by mouth daily.    . naproxen (NAPROSYN) 500 MG tablet Take 1 tablet (500 mg total) by mouth 2 (two) times daily with a meal. For 7 days. Then once daily with meals as needed. 30 tablet 1   No facility-administered medications prior to visit.     ROS Review of Systems       Objective:  BP 128/82 (BP Location: Left Arm, Patient Position: Sitting, Cuff Size: Normal)   Pulse 67   Temp 98.2 F (36.8 C) (Oral)   Resp 18   Ht  (1.549 m)   Wt 232 lb 6.4 oz (105.4 kg)   SpO2 94%   BMI 43.91 kg/m   BP/Weight 07/09/2017 03/30/2017 03/05/2017  Systolic BP 128 119 122  Diastolic BP 82 76 78  Wt. (Lbs) 232.4 235.2 235.4  BMI 43.91 44.44 44.48     Physical Exam    Assessment & Plan:    Problem List Items Addressed This Visit    None      No orders of the defined types were placed in this encounter.   Follow-up: No Follow-up on file.   Lizbeth Bark FNP

## 2017-07-20 ENCOUNTER — Ambulatory Visit
Admission: RE | Admit: 2017-07-20 | Discharge: 2017-07-20 | Disposition: A | Discharge status: Home / Self Care | Payer: BLUE CROSS/BLUE SHIELD | Source: Ambulatory Visit | Attending: Family Medicine | Admitting: Family Medicine

## 2017-07-20 DIAGNOSIS — Z1239 Encounter for other screening for malignant neoplasm of breast: Secondary | ICD-10-CM

## 2017-07-24 ENCOUNTER — Other Ambulatory Visit: Payer: Self-pay | Admitting: Family Medicine

## 2017-07-24 DIAGNOSIS — R928 Other abnormal and inconclusive findings on diagnostic imaging of breast: Secondary | ICD-10-CM

## 2017-07-27 ENCOUNTER — Ambulatory Visit: Payer: BLUE CROSS/BLUE SHIELD

## 2017-07-27 ENCOUNTER — Ambulatory Visit
Admission: RE | Admit: 2017-07-27 | Discharge: 2017-07-27 | Disposition: A | Discharge status: Home / Self Care | Payer: BLUE CROSS/BLUE SHIELD | Source: Ambulatory Visit | Attending: Family Medicine | Admitting: Family Medicine

## 2017-07-27 DIAGNOSIS — R928 Other abnormal and inconclusive findings on diagnostic imaging of breast: Secondary | ICD-10-CM

## 2017-08-08 MED FILL — AMLODIPINE BESYLATE 10 MG T: 10 | 30 days supply | Qty: 30 | Fill #0

## 2017-08-20 MED FILL — HYDROCHLOROTHIAZIDE 25 MG T: 25 | 30 days supply | Qty: 30 | Fill #1

## 2017-08-31 MED FILL — LOSARTAN POTASSIUM 100 MG T: 100 | 30 days supply | Qty: 30 | Fill #1

## 2017-09-10 MED FILL — AMLODIPINE BESYLATE 10 MG T: 10 | 30 days supply | Qty: 30 | Fill #1

## 2017-09-21 MED FILL — HYDROCHLOROTHIAZIDE 25 MG T: 25 | 30 days supply | Qty: 30 | Fill #2

## 2017-10-03 MED FILL — LOSARTAN POTASSIUM 100 MG T: 100 | 30 days supply | Qty: 30 | Fill #2

## 2017-10-10 MED FILL — AMLODIPINE BESYLATE 10 MG T: 10 | 30 days supply | Qty: 30 | Fill #2

## 2017-10-12 ENCOUNTER — Ambulatory Visit: Payer: BLUE CROSS/BLUE SHIELD | Admitting: Registered"

## 2017-10-23 MED FILL — HYDROCHLOROTHIAZIDE 25 MG T: 25 | 30 days supply | Qty: 30 | Fill #3

## 2017-11-05 MED FILL — LOSARTAN POTASSIUM 100 MG T: 100 | 30 days supply | Qty: 30 | Fill #3

## 2017-11-12 MED FILL — AMLODIPINE BESYLATE 10 MG T: 10 | 30 days supply | Qty: 30 | Fill #3

## 2017-11-22 MED FILL — HYDROCHLOROTHIAZIDE 25 MG T: 25 | 30 days supply | Qty: 30 | Fill #4

## 2017-12-06 MED FILL — LOSARTAN POTASSIUM 100 MG T: 100 | 30 days supply | Qty: 30 | Fill #4

## 2017-12-14 MED FILL — AMLODIPINE BESYLATE 10 MG T: 10 | 30 days supply | Qty: 30 | Fill #4

## 2017-12-26 MED FILL — HYDROCHLOROTHIAZIDE 25 MG T: 25 | 30 days supply | Qty: 30 | Fill #5

## 2018-01-04 ENCOUNTER — Other Ambulatory Visit: Payer: Self-pay | Admitting: Family Medicine

## 2018-01-04 ENCOUNTER — Ambulatory Visit
Admission: RE | Admit: 2018-01-04 | Discharge: 2018-01-04 | Disposition: A | Discharge status: Home / Self Care | Payer: BLUE CROSS/BLUE SHIELD | Source: Ambulatory Visit | Attending: Family Medicine | Admitting: Family Medicine

## 2018-01-04 DIAGNOSIS — M179 Osteoarthritis of knee, unspecified: Secondary | ICD-10-CM

## 2018-01-04 DIAGNOSIS — M1710 Unilateral primary osteoarthritis, unspecified knee: Secondary | ICD-10-CM

## 2018-01-08 MED FILL — LOSARTAN POTASSIUM 100 MG T: 100 | 30 days supply | Qty: 30 | Fill #5

## 2018-01-12 MED FILL — AMLODIPINE BESYLATE 10 MG T: 10 | 30 days supply | Qty: 30 | Fill #5

## 2018-01-25 MED FILL — HYDROCHLOROTHIAZIDE 25 MG T: 25 | 30 days supply | Qty: 30 | Fill #0

## 2018-02-06 MED FILL — LOSARTAN POTASSIUM 100 MG T: 100 | 90 days supply | Qty: 90 | Fill #0

## 2018-02-12 MED FILL — AMLODIPINE BESYLATE 10 MG T: 10 | 30 days supply | Qty: 30 | Fill #0

## 2018-02-25 MED FILL — HYDROCHLOROTHIAZIDE 25 MG T: 25 | 30 days supply | Qty: 30 | Fill #1

## 2018-03-12 MED FILL — AMLODIPINE BESYLATE 10 MG T: 10 | 30 days supply | Qty: 30 | Fill #1

## 2018-03-25 MED FILL — HYDROCHLOROTHIAZIDE 25 MG T: 25 | 30 days supply | Qty: 30 | Fill #2

## 2018-04-12 MED FILL — AMLODIPINE BESYLATE 10 MG T: 10 | 30 days supply | Qty: 30 | Fill #0

## 2018-04-26 MED FILL — HYDROCHLOROTHIAZIDE 25 MG T: 25 | 30 days supply | Qty: 30 | Fill #0

## 2018-05-08 ENCOUNTER — Other Ambulatory Visit: Payer: Self-pay

## 2018-05-08 MED FILL — LOSARTAN-HCTZ 100-25 MG TAB: 100-25 | 30 days supply | Qty: 30 | Fill #0

## 2018-05-13 MED FILL — AMLODIPINE BESYLATE 10 MG T: 10 | 30 days supply | Qty: 30 | Fill #1

## 2018-05-23 MED FILL — HYDROCHLOROTHIAZIDE 25 MG T: 25 | 30 days supply | Qty: 30 | Fill #1

## 2018-06-06 MED FILL — LOSARTAN-HCTZ 100-25 MG TAB: 100-25 | 30 days supply | Qty: 30 | Fill #1

## 2018-06-11 MED FILL — AMLODIPINE BESYLATE 10 MG T: 10 | 30 days supply | Qty: 30 | Fill #2

## 2018-06-25 MED FILL — HYDROCHLOROTHIAZIDE 25 MG T: 25 | 30 days supply | Qty: 30 | Fill #0

## 2018-07-08 MED FILL — LOSARTAN-HCTZ 100-25 MG TAB: 100-25 | 90 days supply | Qty: 90 | Fill #2

## 2018-07-10 ENCOUNTER — Emergency Department (HOSPITAL_COMMUNITY)
Admission: EM | Admit: 2018-07-10 | Discharge: 2018-07-10 | Disposition: A | Discharge status: Home / Self Care | Payer: No Typology Code available for payment source | Attending: Emergency Medicine | Admitting: Emergency Medicine

## 2018-07-10 ENCOUNTER — Emergency Department (HOSPITAL_COMMUNITY): Payer: No Typology Code available for payment source

## 2018-07-10 ENCOUNTER — Encounter (HOSPITAL_COMMUNITY): Payer: Self-pay | Admitting: *Deleted

## 2018-07-10 DIAGNOSIS — Y999 Unspecified external cause status: Secondary | ICD-10-CM | POA: Diagnosis not present

## 2018-07-10 DIAGNOSIS — S060X0A Concussion without loss of consciousness, initial encounter: Secondary | ICD-10-CM | POA: Diagnosis present

## 2018-07-10 DIAGNOSIS — I10 Essential (primary) hypertension: Secondary | ICD-10-CM | POA: Insufficient documentation

## 2018-07-10 DIAGNOSIS — Z87891 Personal history of nicotine dependence: Secondary | ICD-10-CM | POA: Diagnosis not present

## 2018-07-10 DIAGNOSIS — Y939 Activity, unspecified: Secondary | ICD-10-CM | POA: Insufficient documentation

## 2018-07-10 DIAGNOSIS — Z79899 Other long term (current) drug therapy: Secondary | ICD-10-CM | POA: Insufficient documentation

## 2018-07-10 DIAGNOSIS — Y929 Unspecified place or not applicable: Secondary | ICD-10-CM | POA: Insufficient documentation

## 2018-07-10 DIAGNOSIS — M5440 Lumbago with sciatica, unspecified side: Secondary | ICD-10-CM
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
## 2018-07-10 NOTE — Discharge Instructions (Addendum)
A concussion is a very mild traumatic brain injury caused by a bump, jolt or blow to the head, most people recover quickly and fully. You can experience a wide variety of symptoms including:   - Confusion      - Difficulty concentrating       - Trouble remembering new info  - Headache      - Dizziness        - Fuzzy or blurry vision  - Fatigue      - Balance problems      - Light sensitivity  - Mood swings     - Changes in sleep or difficulty sleeping   To help these symptoms improve make sure you are getting plenty of rest, avoid screen time, loud music and strenuous mental activities. Avoid any strenuous physical activities, once your symptoms have resolved a slow and gradual return to activity is recommended. It is very important that you avoid situations in which you might sustain a second head injury as this can be very dangerous and life threatening. You cannot be medically cleared to return to normal activities until you have followed up with your primary doctor or a concussion specialist for reevaluation.  You  may alternate ibuprofen or tylenol for back pain. Please follow up with PCP in 1 week for reevaluation.

## 2018-07-15 MED FILL — AMLODIPINE BESYLATE 10 MG T: 10 | 30 days supply | Qty: 30 | Fill #0

## 2018-07-23 MED FILL — HYDROCHLOROTHIAZIDE 25 MG T: 25 | 30 days supply | Qty: 30 | Fill #1

## 2018-08-13 MED FILL — AMLODIPINE BESYLATE 10 MG T: 10 | 30 days supply | Qty: 30 | Fill #1

## 2018-08-26 MED FILL — HYDROCHLOROTHIAZIDE 25 MG T: 25 | 30 days supply | Qty: 30 | Fill #0

## 2018-09-09 MED FILL — AMLODIPINE BESYLATE 10 MG T: 10 | 30 days supply | Qty: 30 | Fill #2

## 2018-09-23 MED FILL — HYDROCHLOROTHIAZIDE 25 MG T: 25 | 30 days supply | Qty: 30 | Fill #1

## 2018-10-07 MED FILL — LOSARTAN-HCTZ 100-25 MG TAB: 100-25 | 30 days supply | Qty: 30 | Fill #3

## 2018-10-10 MED FILL — AMLODIPINE BESYLATE 10 MG T: 10 | 30 days supply | Qty: 30 | Fill #0

## 2018-10-30 MED FILL — HYDROCHLOROTHIAZIDE 25 MG T: 25 | 30 days supply | Qty: 30 | Fill #0

## 2018-11-08 MED FILL — AMLODIPINE BESYLATE 10 MG T: 10 | 30 days supply | Qty: 30 | Fill #1

## 2018-11-08 MED FILL — LOSARTAN-HCTZ 100-25 MG TAB: 100-25 | 30 days supply | Qty: 30 | Fill #0

## 2018-11-16 ENCOUNTER — Other Ambulatory Visit: Payer: Self-pay

## 2018-11-16 ENCOUNTER — Emergency Department (HOSPITAL_COMMUNITY)
Admission: EM | Admit: 2018-11-16 | Discharge: 2018-11-16 | Disposition: A | Discharge status: Home / Self Care | Payer: Medicaid Other | Attending: Emergency Medicine | Admitting: Emergency Medicine

## 2018-11-16 ENCOUNTER — Encounter (HOSPITAL_COMMUNITY): Payer: Self-pay | Admitting: Emergency Medicine

## 2018-11-16 DIAGNOSIS — K921 Melena: Secondary | ICD-10-CM

## 2018-11-16 DIAGNOSIS — Z87891 Personal history of nicotine dependence: Secondary | ICD-10-CM | POA: Diagnosis not present

## 2018-11-16 DIAGNOSIS — I10 Essential (primary) hypertension: Secondary | ICD-10-CM | POA: Diagnosis not present

## 2018-11-16 DIAGNOSIS — K922 Gastrointestinal hemorrhage, unspecified: Secondary | ICD-10-CM | POA: Diagnosis not present

## 2018-11-16 DIAGNOSIS — Z79899 Other long term (current) drug therapy: Secondary | ICD-10-CM | POA: Insufficient documentation

## 2018-11-16 DIAGNOSIS — K625 Hemorrhage of anus and rectum: Secondary | ICD-10-CM | POA: Diagnosis present

## 2018-11-16 LAB — COMPREHENSIVE METABOLIC PANEL
ALBUMIN: 2.8 g/dL — AB (ref 3.5–5.0)
ALK PHOS: 46 U/L (ref 38–126)
ALT: 19 U/L (ref 0–44)
ANION GAP: 11 (ref 5–15)
AST: 27 U/L (ref 15–41)
BILIRUBIN TOTAL: 0.6 mg/dL (ref 0.3–1.2)
BUN: 18 mg/dL (ref 6–20)
CALCIUM: 8.7 mg/dL — AB (ref 8.9–10.3)
CO2: 31 mmol/L (ref 22–32)
Chloride: 97 mmol/L — ABNORMAL LOW (ref 98–111)
Creatinine, Ser: 0.7 mg/dL (ref 0.44–1.00)
GFR calc non Af Amer: >60 mL/min (ref >60)
GLUCOSE: 124 mg/dL — AB (ref 70–99)
POTASSIUM: 3.3 mmol/L — AB (ref 3.5–5.1)
SODIUM: 139 mmol/L (ref 135–145)
TOTAL PROTEIN: 5.6 g/dL — AB (ref 6.5–8.1)

## 2018-11-16 LAB — CBC WITH DIFFERENTIAL/PLATELET
ABS IMMATURE GRANULOCYTES: 0.01 10*3/uL (ref 0.00–0.07)
BASOS ABS: 0 10*3/uL (ref 0.0–0.1)
BASOS PCT: 0 %
EOS ABS: 0 10*3/uL (ref 0.0–0.5)
Eosinophils Relative: 0 %
HCT: 37.2 % (ref 36.0–46.0)
Hemoglobin: 11.2 g/dL — ABNORMAL LOW (ref 12.0–15.0)
IMMATURE GRANULOCYTES: 0 %
Lymphocytes Relative: 21 %
Lymphs Abs: 1 10*3/uL (ref 0.7–4.0)
MCH: 23.6 pg — ABNORMAL LOW (ref 26.0–34.0)
MCHC: 30.1 g/dL (ref 30.0–36.0)
MCV: 78.5 fL — ABNORMAL LOW (ref 80.0–100.0)
MONOS PCT: 5 %
Monocytes Absolute: 0.3 10*3/uL (ref 0.1–1.0)
NEUTROS ABS: 3.5 10*3/uL (ref 1.7–7.7)
NEUTROS PCT: 74 %
PLATELETS: 282 10*3/uL (ref 150–400)
RBC: 4.74 MIL/uL (ref 3.87–5.11)
RDW: 15.7 % — AB (ref 11.5–15.5)
WBC: 4.8 10*3/uL (ref 4.0–10.5)
nRBC: 0 % (ref 0.0–0.2)

## 2018-11-16 LAB — LIPASE, BLOOD: LIPASE: 19 U/L (ref 11–51)

## 2018-11-16 LAB — POC OCCULT BLOOD, ED: FECAL OCCULT BLD: POSITIVE — AB

## 2018-11-16 MED ORDER — SODIUM CHLORIDE 0.9 % IV BOLUS
500.0000 mL | Freq: Once | INTRAVENOUS | Status: AC
  Administered 2018-11-16: 500 mL via INTRAVENOUS

## 2018-11-16 NOTE — Discharge Instructions (Addendum)
Stop taking the diclofenac which may be contributing to your rectal bleeding.  Then a low fiber diet, for now.  You develop abdominal pain, weakness, dizziness, have other concerns, return here for reevaluation.

## 2018-11-16 NOTE — ED Triage Notes (Signed)
Per EMS pt from home with complaints of blood in stool that started today and bloody emesis x1 event that started today.  Pt states that she changed her BP meds from HCTZ to Losartan with HCTZ.   Since the change in meds this past Tuesday she complains of abdominal pain.  NAD noted at this time AOX4

## 2018-11-16 NOTE — ED Provider Notes (Signed)
[facility] Provider Note   CSN: [id number] Arrival date & time: 11/16/18  1319     History   Chief Complaint Chief Complaint  Patient presents with  . Rectal Bleeding  . Hematemesis    HPI Tamara Russell is a 58 y.o. female.  HPI   Here for evaluation of blood in emesis, blood in stool, and abdominal pain.  Onset of symptoms after recently starting losartan with hydrochlorothiazide.  No prior rectal bleeding.  History of GERD.  She has previously used Naprosyn and Motrin but not currently.  She is using abdominal medication for "back pain," at this time.  She denies chest pain, shortness of breath, weakness or dizziness.  She does not take anticoagulants.  There are no other known modifying factors.  Past Medical History:  Diagnosis Date  . Acid reflux   . Hypertension     Patient Active Problem List   Diagnosis Date Noted  . GERD (gastroesophageal reflux disease) 11/19/2015  . Acute respiratory failure with hypoxia (HCC) 11/19/2015  . AKI (acute kidney injury) (HCC) 11/19/2015  . Acute bronchitis 11/19/2015  . HTN (hypertension) 04/01/2015    Past Surgical History:  Procedure Laterality Date  . ABDOMINAL HYSTERECTOMY    . BREAST EXCISIONAL BIOPSY Left    benign  . BREAST EXCISIONAL BIOPSY Right    benign     OB History   No obstetric history on file.      Home Medications    Prior to Admission medications   Medication Sig Start Date End Date Taking? Authorizing Provider  acetaminophen (TYLENOL) 500 MG tablet Take 1,000 mg by mouth every 6 (six) hours as needed for moderate pain. Reported on 04/21/2016   Yes [provider]  amLODipine (NORVASC) 10 MG tablet Take 1 tablet (10 mg total) by mouth daily. 07/09/17  Yes Hairston, Oren BeckmannMandesia R, FNP  Diclofenac (ZORVOLEX) 35 MG CAPS Take 1 capsule by mouth 3 (three) times a week.   Yes [provider]  losartan-hydrochlorothiazide (HYZAAR) 100-25 MG tablet  Take 1 tablet by mouth daily. 07/08/18  Yes [provider]  Multiple Vitamins-Minerals (CENTRUM SILVER ADULT 50+ PO) Take 1 tablet by mouth daily.   Yes [provider]  hydrochlorothiazide (HYDRODIURIL) 25 MG tablet Take 1 tablet (25 mg total) by mouth daily. Patient not taking: Reported on 11/16/2018 07/09/17   Lizbeth BarkHairston, Mandesia R, FNP  ibuprofen (ADVIL,MOTRIN) 800 MG tablet Take 1 tablet (800 mg total) by mouth every 8 (eight) hours as needed (Take with food.). Patient not taking: Reported on 01/16/2017 10/02/16   Lizbeth BarkHairston, Mandesia R, FNP  losartan (COZAAR) 100 MG tablet Take 1 tablet (100 mg total) by mouth daily. Patient not taking: Reported on 11/16/2018 07/09/17   Lizbeth BarkHairston, Mandesia R, FNP  naproxen sodium (ALEVE) 220 MG tablet Take 1 tablet (220 mg total) by mouth 2 (two) times daily with a meal. Patient not taking: Reported on 11/16/2018 07/09/17   Lizbeth BarkHairston, Mandesia R, FNP    Family History Family History  Problem Relation Age of Onset  . Cancer Father [id number]       stomach  . Hypertension Father     Social History Social History   Tobacco Use  . Smoking status: Former Smoker    Packs/day: 0.50    Years: 20.00    Pack years: 10.00    Types: Cigarettes    Start date: 04/30/1975    Last attempt to quit: 04/30/1995    Years since quitting:  23.5  . Smokeless tobacco: Never Used  Substance Use Topics  . Alcohol use: No    Alcohol/week: 0.0 standard drinks  . Drug use: No     Allergies   Penicillins   Review of Systems Review of Systems  All other systems reviewed and are negative.    Physical Exam Updated Vital Signs BP 134/90   Pulse 91   Temp 99 F (37.2 C) (Oral)   Resp 14   Ht 5' 1.5" (1.562 m)   Wt 104.3 kg   SpO2 97%   BMI 42.75 kg/m   Physical Exam Vitals signs and nursing note reviewed.  Constitutional:      General: She is not in acute distress.    Appearance: Normal appearance. She is well-developed. She is obese. She is not  ill-appearing, toxic-appearing or diaphoretic.  HENT:     Head: Normocephalic and atraumatic.     Right Ear: External ear normal.     Left Ear: External ear normal.  Eyes:     Conjunctiva/sclera: Conjunctivae normal.     Pupils: Pupils are equal, round, and reactive to light.  Neck:     Musculoskeletal: Normal range of motion and neck supple.     Trachea: Phonation normal.  Cardiovascular:     Rate and Rhythm: Normal rate and regular rhythm.     Heart sounds: Normal heart sounds.  Pulmonary:     Effort: Pulmonary effort is normal.     Breath sounds: Normal breath sounds.  Abdominal:     Palpations: Abdomen is soft.     Tenderness: There is no abdominal tenderness.  Genitourinary:    Comments: Anal exam abnormal with posterior skin tag, likely old hemorrhoid which is been decompressed.  No apparent external/anal/hemorrhoid bleeding.  Digital examination there is melanotic material in the rectum.  There is no palpable rectal mass. Musculoskeletal: Normal range of motion.  Skin:    General: Skin is warm and dry.  Neurological:     Mental Status: She is alert and oriented to person, place, and time.     Cranial Nerves: No cranial nerve deficit.     Sensory: No sensory deficit.     Motor: No abnormal muscle tone.     Coordination: Coordination normal.  Psychiatric:        Behavior: Behavior normal.        Thought Content: Thought content normal.        Judgment: Judgment normal.      ED Treatments / Results  Labs (all labs ordered are listed, but only abnormal results are displayed) Labs Reviewed  COMPREHENSIVE METABOLIC PANEL - Abnormal; Notable for the following components:      Result Value   Potassium 3.3 (*)    Chloride 97 (*)    Glucose, Bld 124 (*)    Calcium 8.7 (*)    Total Protein 5.6 (*)    Albumin 2.8 (*)    All other components within normal limits  CBC WITH DIFFERENTIAL/PLATELET - Abnormal; Notable for the following components:   Hemoglobin 11.2 (*)     MCV 78.5 (*)    MCH 23.6 (*)    RDW 15.7 (*)    All other components within normal limits  POC OCCULT BLOOD, ED - Abnormal; Notable for the following components:   Fecal Occult Bld POSITIVE (*)    All other components within normal limits  LIPASE, BLOOD    EKG EKG Interpretation  Date/Time:  Saturday November 16 2018 13:23:49 EST Ventricular  Rate:  89 PR Interval:    QRS Duration: 81 QT Interval:  351 QTC Calculation: 427 R Axis:   3 Text Interpretation:  Sinus rhythm Multiple ventricular premature complexes Borderline T wave abnormalities Since last tracing pvc are new Confirmed by Mancel Bale [id number]) on 11/16/2018 1:30:59 PM   Radiology No results found.  Procedures Procedures (including critical care time)  Medications Ordered in ED Medications  sodium chloride 0.9 % bolus 500 mL (0 mLs Intravenous Stopped 11/16/18 1603)     Initial Impression / Assessment and Plan / ED Course  I have reviewed the triage vital signs and the nursing notes.  Pertinent labs & imaging results that were available during my care of the patient were reviewed by me and considered in my medical decision making (see chart for details).  Clinical Course as of Nov 16 1856  Sat Nov 16, 2018  1516 Normal  Lipase, blood [EW]  1516 , Except hemoglobin low 11.2, MCV low 78  CBC with Differential(!) [EW]  1516 Normal except potassium low, chloride low, glucose high, calcium low, total protein low, albumin low  Comprehensive metabolic panel(!) [EW]    Clinical Course User Index [EW] Mancel Bale, MD     Patient Vitals for the past 24 hrs:  BP Temp Temp src Pulse Resp SpO2 Height Weight  11/16/18 1630 134/90 - - 91 - 97 % - -  11/16/18 1530 116/87 - - 82 - 96 % - -  11/16/18 1515 131/64 - - 89 - 94 % - -  11/16/18 1500 132/85 - - 86 14 100 % - -  11/16/18 1325 - - - - - - 5' 1.5" (1.562 m) 104.3 kg  11/16/18 1324 137/84 99 F (37.2 C) Oral 80 17 96 % - -    3:16 PM Reevaluation with  update and discussion. After initial assessment and treatment, an updated evaluation reveals no further complaint, she is fairly comfortable and not vomited here.  Patient determined that the medicine she was taking for back pain as diclofenac.  Patient, and daughter, updated on findings. Mancel Bale   Medical Decision Making: Painless rectal bleeding characterized by melena.  Episode of vomiting, nonspecific.  ED observation course is reassuring.  Patient with mild anemia.  Vital signs normal with negative orthostatic blood pressure and heart rate evaluation.  Gastrointestinal bleeding nonspecific, hemodynamically stable.  She is a candidate for outpatient observation and follow-up with gastroenterology.  She will likely need colonoscopy.  I doubt that this is an upper GI bleed.  CRITICAL CARE- no Performed by: Mancel Bale   Nursing Notes Reviewed/ Care Coordinated Applicable Imaging Reviewed Interpretation of Laboratory Data incorporated into ED treatment  The patient appears reasonably screened and/or stabilized for discharge and I doubt any other medical condition or other [facility] requiring further screening, evaluation, or treatment in the ED at this time prior to discharge.  Plan: Home Medications-stop diclofenac, continue other medications.; Home Treatments-rest, fluids, be careful when standing.; return here if the recommended treatment, does not improve the symptoms; Recommended follow up-GI follow-up for further testing and treatment.  Return here if needed.       Final Clinical Impressions(s) / ED Diagnoses   Final diagnoses:  Gastrointestinal hemorrhage with melena    ED Discharge Orders    None       Mancel Bale, MD 11/16/18 1859

## 2018-11-17 ENCOUNTER — Inpatient Hospital Stay (HOSPITAL_COMMUNITY)
Admission: EM | Admit: 2018-11-17 | Discharge: 2018-11-19 | DRG: 378 | Disposition: A | Discharge status: Home / Self Care | Payer: Medicaid Other | Attending: Internal Medicine | Admitting: Internal Medicine

## 2018-11-17 ENCOUNTER — Observation Stay (HOSPITAL_COMMUNITY): Payer: Medicaid Other | Admitting: Certified Registered Nurse Anesthetist

## 2018-11-17 ENCOUNTER — Encounter (HOSPITAL_COMMUNITY): Payer: Self-pay | Admitting: Emergency Medicine

## 2018-11-17 ENCOUNTER — Other Ambulatory Visit: Payer: Self-pay

## 2018-11-17 ENCOUNTER — Encounter (HOSPITAL_COMMUNITY)
Admission: EM | Disposition: A | Discharge status: Home / Self Care | Payer: Self-pay | Source: Home / Self Care | Attending: Internal Medicine

## 2018-11-17 DIAGNOSIS — K2981 Duodenitis with bleeding: Secondary | ICD-10-CM | POA: Diagnosis present

## 2018-11-17 DIAGNOSIS — K922 Gastrointestinal hemorrhage, unspecified: Secondary | ICD-10-CM

## 2018-11-17 DIAGNOSIS — K269 Duodenal ulcer, unspecified as acute or chronic, without hemorrhage or perforation: Secondary | ICD-10-CM

## 2018-11-17 DIAGNOSIS — K222 Esophageal obstruction: Secondary | ICD-10-CM | POA: Diagnosis present

## 2018-11-17 DIAGNOSIS — D62 Acute posthemorrhagic anemia: Secondary | ICD-10-CM

## 2018-11-17 DIAGNOSIS — K920 Hematemesis: Secondary | ICD-10-CM

## 2018-11-17 DIAGNOSIS — K259 Gastric ulcer, unspecified as acute or chronic, without hemorrhage or perforation: Secondary | ICD-10-CM

## 2018-11-17 DIAGNOSIS — Z6841 Body Mass Index (BMI) 40.0 and over, adult: Secondary | ICD-10-CM

## 2018-11-17 DIAGNOSIS — Z87891 Personal history of nicotine dependence: Secondary | ICD-10-CM

## 2018-11-17 DIAGNOSIS — Z79899 Other long term (current) drug therapy: Secondary | ICD-10-CM

## 2018-11-17 DIAGNOSIS — K279 Peptic ulcer, site unspecified, unspecified as acute or chronic, without hemorrhage or perforation: Secondary | ICD-10-CM

## 2018-11-17 DIAGNOSIS — T39395A Adverse effect of other nonsteroidal anti-inflammatory drugs [NSAID], initial encounter: Secondary | ICD-10-CM | POA: Diagnosis present

## 2018-11-17 DIAGNOSIS — E876 Hypokalemia: Secondary | ICD-10-CM | POA: Diagnosis present

## 2018-11-17 DIAGNOSIS — I10 Essential (primary) hypertension: Secondary | ICD-10-CM | POA: Diagnosis present

## 2018-11-17 DIAGNOSIS — Z90710 Acquired absence of both cervix and uterus: Secondary | ICD-10-CM

## 2018-11-17 DIAGNOSIS — K264 Chronic or unspecified duodenal ulcer with hemorrhage: Secondary | ICD-10-CM | POA: Diagnosis present

## 2018-11-17 DIAGNOSIS — K219 Gastro-esophageal reflux disease without esophagitis: Secondary | ICD-10-CM | POA: Diagnosis present

## 2018-11-17 DIAGNOSIS — K250 Acute gastric ulcer with hemorrhage: Principal | ICD-10-CM

## 2018-11-17 DIAGNOSIS — K2980 Duodenitis without bleeding: Secondary | ICD-10-CM

## 2018-11-17 HISTORY — PX: ESOPHAGOGASTRODUODENOSCOPY (EGD) WITH PROPOFOL: SHX5813

## 2018-11-17 HISTORY — DX: Other complications of anesthesia, initial encounter: T88.59XA

## 2018-11-17 HISTORY — PX: BIOPSY: SHX5522

## 2018-11-17 HISTORY — DX: Adverse effect of unspecified anesthetic, initial encounter: T41.45XA

## 2018-11-17 LAB — PROTIME-INR
INR: 1.65
Prothrombin Time: 19.3 seconds — ABNORMAL HIGH (ref 11.4–15.2)

## 2018-11-17 LAB — COMPREHENSIVE METABOLIC PANEL
ALK PHOS: 42 U/L (ref 38–126)
ALT: 16 U/L (ref 0–44)
AST: 19 U/L (ref 15–41)
Albumin: 2.7 g/dL — ABNORMAL LOW (ref 3.5–5.0)
Anion gap: 9 (ref 5–15)
BUN: 25 mg/dL — AB (ref 6–20)
CALCIUM: 8.5 mg/dL — AB (ref 8.9–10.3)
CHLORIDE: 100 mmol/L (ref 98–111)
CO2: 31 mmol/L (ref 22–32)
CREATININE: 0.74 mg/dL (ref 0.44–1.00)
GFR calc Af Amer: >60 mL/min (ref >60)
GFR calc non Af Amer: >60 mL/min (ref >60)
GLUCOSE: 116 mg/dL — AB (ref 70–99)
Potassium: 3.4 mmol/L — ABNORMAL LOW (ref 3.5–5.1)
SODIUM: 140 mmol/L (ref 135–145)
Total Bilirubin: 0.5 mg/dL (ref 0.3–1.2)
Total Protein: 5.3 g/dL — ABNORMAL LOW (ref 6.5–8.1)

## 2018-11-17 LAB — CBC WITH DIFFERENTIAL/PLATELET
ABS IMMATURE GRANULOCYTES: 0.05 10*3/uL (ref 0.00–0.07)
Basophils Absolute: 0 10*3/uL (ref 0.0–0.1)
Basophils Relative: 0 %
Eosinophils Absolute: 0 10*3/uL (ref 0.0–0.5)
Eosinophils Relative: 0 %
HCT: 29.1 % — ABNORMAL LOW (ref 36.0–46.0)
HEMOGLOBIN: 8.9 g/dL — AB (ref 12.0–15.0)
IMMATURE GRANULOCYTES: 1 %
LYMPHS PCT: 17 %
Lymphs Abs: 1.4 10*3/uL (ref 0.7–4.0)
MCH: 24.1 pg — AB (ref 26.0–34.0)
MCHC: 30.6 g/dL (ref 30.0–36.0)
MCV: 78.6 fL — ABNORMAL LOW (ref 80.0–100.0)
MONOS PCT: 4 %
Monocytes Absolute: 0.3 10*3/uL (ref 0.1–1.0)
NEUTROS PCT: 78 %
Neutro Abs: 6.5 10*3/uL (ref 1.7–7.7)
Platelets: 287 10*3/uL (ref 150–400)
RBC: 3.7 MIL/uL — AB (ref 3.87–5.11)
RDW: 15.8 % — ABNORMAL HIGH (ref 11.5–15.5)
WBC: 8.3 10*3/uL (ref 4.0–10.5)
nRBC: 0 % (ref 0.0–0.2)

## 2018-11-17 LAB — FERRITIN: Ferritin: 29 ng/mL (ref 11–307)

## 2018-11-17 LAB — CBC
HCT: 26.5 % — ABNORMAL LOW (ref 36.0–46.0)
HEMOGLOBIN: 8.4 g/dL — AB (ref 12.0–15.0)
MCH: 24.6 pg — ABNORMAL LOW (ref 26.0–34.0)
MCHC: 31.7 g/dL (ref 30.0–36.0)
MCV: 77.5 fL — ABNORMAL LOW (ref 80.0–100.0)
Platelets: 256 10*3/uL (ref 150–400)
RBC: 3.42 MIL/uL — ABNORMAL LOW (ref 3.87–5.11)
RDW: 15.7 % — ABNORMAL HIGH (ref 11.5–15.5)
WBC: 7.6 10*3/uL (ref 4.0–10.5)
nRBC: 0 % (ref 0.0–0.2)

## 2018-11-17 LAB — RETICULOCYTES
Immature Retic Fract: 28.3 % — ABNORMAL HIGH (ref 2.3–15.9)
RBC.: 3.47 MIL/uL — AB (ref 3.87–5.11)
Retic Count, Absolute: 80.2 10*3/uL (ref 19.0–186.0)
Retic Ct Pct: 2.3 % (ref 0.4–3.1)

## 2018-11-17 LAB — FOLATE: Folate: 33.3 ng/mL (ref >5.9)

## 2018-11-17 LAB — ABO/RH: ABO/RH(D): O POS

## 2018-11-17 LAB — VITAMIN B12: Vitamin B-12: 728 pg/mL (ref 180–914)

## 2018-11-17 LAB — IRON AND TIBC
Iron: 58 ug/dL (ref 28–170)
SATURATION RATIOS: 21 % (ref 10.4–31.8)
TIBC: 277 ug/dL (ref 250–450)
UIBC: 219 ug/dL

## 2018-11-17 SURGERY — ESOPHAGOGASTRODUODENOSCOPY (EGD) WITH PROPOFOL
Anesthesia: Monitor Anesthesia Care

## 2018-11-17 MED ORDER — PANTOPRAZOLE SODIUM 40 MG IV SOLR
80.0000 mg | Freq: Once | INTRAVENOUS | Status: AC
  Administered 2018-11-17: 80 mg via INTRAVENOUS
  Filled 2018-11-17: qty 80

## 2018-11-17 MED ORDER — PANTOPRAZOLE SODIUM 40 MG IV SOLR: 40.0000 mg | Freq: Two times a day (BID) | INTRAVENOUS | Status: DC

## 2018-11-17 MED ORDER — ACETAMINOPHEN 325 MG PO TABS: 650.0000 mg | ORAL_TABLET | Freq: Four times a day (QID) | ORAL | Status: DC | PRN

## 2018-11-17 MED ORDER — HYDROCHLOROTHIAZIDE 25 MG PO TABS
25.0000 mg | ORAL_TABLET | Freq: Every day | ORAL | Status: DC
  Administered 2018-11-18: 25 mg via ORAL
  Filled 2018-11-17: qty 1

## 2018-11-17 MED ORDER — SODIUM CHLORIDE 0.9 % IV SOLN
8.0000 mg/h | INTRAVENOUS | Status: DC
  Filled 2018-11-17 (×2): qty 80

## 2018-11-17 MED ORDER — ONDANSETRON HCL 4 MG/2ML IJ SOLN: 4.0000 mg | Freq: Four times a day (QID) | INTRAMUSCULAR | Status: DC | PRN

## 2018-11-17 MED ORDER — AMLODIPINE BESYLATE 10 MG PO TABS
10.0000 mg | ORAL_TABLET | Freq: Every day | ORAL | Status: DC
  Administered 2018-11-18 – 2018-11-19 (×2): 10 mg via ORAL
  Filled 2018-11-17 (×2): qty 1

## 2018-11-17 MED ORDER — LACTATED RINGERS IV SOLN
INTRAVENOUS | Status: DC | PRN
  Administered 2018-11-17: 15:00:00 via INTRAVENOUS

## 2018-11-17 MED ORDER — PROPOFOL 500 MG/50ML IV EMUL
INTRAVENOUS | Status: DC | PRN
  Administered 2018-11-17: 100 ug/kg/min via INTRAVENOUS

## 2018-11-17 MED ORDER — ONDANSETRON HCL 4 MG PO TABS: 4.0000 mg | ORAL_TABLET | Freq: Four times a day (QID) | ORAL | Status: DC | PRN

## 2018-11-17 MED ORDER — LOSARTAN POTASSIUM-HCTZ 100-25 MG PO TABS: 1.0000 | ORAL_TABLET | Freq: Every day | ORAL | Status: DC

## 2018-11-17 MED ORDER — LOSARTAN POTASSIUM 50 MG PO TABS
100.0000 mg | ORAL_TABLET | Freq: Every day | ORAL | Status: DC
  Administered 2018-11-18 – 2018-11-19 (×2): 100 mg via ORAL
  Filled 2018-11-17 (×2): qty 2

## 2018-11-17 MED ORDER — ACETAMINOPHEN 650 MG RE SUPP: 650.0000 mg | Freq: Four times a day (QID) | RECTAL | Status: DC | PRN

## 2018-11-17 MED ORDER — PROPOFOL 10 MG/ML IV BOLUS
INTRAVENOUS | Status: DC | PRN
  Administered 2018-11-17 (×3): 20 mg via INTRAVENOUS

## 2018-11-17 MED ORDER — PHENYLEPHRINE 40 MCG/ML (10ML) SYRINGE FOR IV PUSH (FOR BLOOD PRESSURE SUPPORT)
PREFILLED_SYRINGE | INTRAVENOUS | Status: DC | PRN
  Administered 2018-11-17 (×2): 80 ug via INTRAVENOUS

## 2018-11-17 MED ORDER — PANTOPRAZOLE SODIUM 40 MG IV SOLR
40.0000 mg | Freq: Two times a day (BID) | INTRAVENOUS | Status: DC
  Administered 2018-11-17 – 2018-11-18 (×3): 40 mg via INTRAVENOUS
  Filled 2018-11-17 (×3): qty 40

## 2018-11-17 MED ORDER — SODIUM CHLORIDE 0.9 % IV SOLN
INTRAVENOUS | Status: DC
  Administered 2018-11-17 – 2018-11-18 (×2): via INTRAVENOUS

## 2018-11-17 SURGICAL SUPPLY — 15 items

## 2018-11-17 NOTE — Consult Note (Signed)
Referring Provider: [facility]  Primary Care Physician:  [name], MD Primary Gastroenterologist: Lockie Mola     Reason for Consultation:     GI bleed   ASSESSMENT / PLAN:     1. 58 yo female with GI bleed in setting on NSAIDs.   Presumably upper source given NSAID use , hematemesis and LUQ discomfort.  BUN minimally elevated at 25 . She is not having melena but rather passing dark red blood from the rectum which is a little confusing .  She is hemodynamically stable so doubt brisk upper GI bleed.  -Patient will need EGD, possibly today.  Keep n.p.o.. The risks and benefits of EGD were discussed and the patient agrees to proceed.  -agree PPI ggt -monitor CBC and transfuse as needed.   2.  Microcytic anemia setting of acute GI bleed.  Hemoglobin 14-10 at baseline, steadily declining.  She was 11.2 in the ED yesterday, now down to 8.9.  She may be iron deficient given microcytosis.  IDA if present cannot be explained by acute GI bleed -monitor CBC, transfuse as needed -We will obtain iron studies now.  It will be more accurate than what she has been transfused  3. Hx of colon polyps?  Patient describes removal of polyp several years ago.  Think she had it done somewhere close to [facility].  I see no records from [place].  -This can be addressed at a later date unless EGD negative then she may need one inpatient.     HPI:      HPI: Tamara Russell is a 58 y.o. female with past medical history of hypertension and GERD .  Patient brought by EMS to ED yesterday for evaluation of blood in stool, bloody emesis, and abdominal pain..  Melenic stool on DRE was described.  Labs felt to be reassuring, she was hemodynamically stable.  Patient discharged from the ED with recommendations to follow-up with GI.  NSAIDs were discontinued.  At that point bleeding was felt to be from lower source.   Returns to the ED with complaints of ongoing GI bleed.  Self that the bleeding  started Friday evening after eating pizza.  Patient had 3 episodes of bloody stool.  Blood was described as being dark red.  On Saturday patient vomited what she describes as dark red blood.  No further vomiting but last night and this morning she had more bloody stool, again described as dark red.  Her hemoglobin dropped overnight from 11.2 to 8.9.   Patient takes NSAIDs, Aleve and Motrin are on home med list but she apparently takes diclofenac.  She complains of LUQ pain which started this morning.  She has a history of GERD with intermittent symptoms.  She takes unknown acid blocker but only as needed.  No history of upper GI bleed nor PUD.Marland Kitchen She describes a colonoscopy with removal of polyps many years ago.  Not recall the name of the practice where the procedure was done but says it close to [facility]. Couldn't locate records with [place].     Past Medical History:  Diagnosis Date  . Acid reflux   . Hypertension     Past Surgical History:  Procedure Laterality Date  . ABDOMINAL HYSTERECTOMY    . BREAST EXCISIONAL BIOPSY Left    benign  . BREAST EXCISIONAL BIOPSY Right    benign    Prior to Admission medications   Medication Sig Start Date End Date Taking? Authorizing Provider  acetaminophen (  TYLENOL) 500 MG tablet Take 1,000 mg by mouth every 6 (six) hours as needed for moderate pain. Reported on 04/21/2016    [provider]  amLODipine (NORVASC) 10 MG tablet Take 1 tablet (10 mg total) by mouth daily. 07/09/17   Lizbeth BarkHairston, Mandesia R, FNP  Diclofenac (ZORVOLEX) 35 MG CAPS Take 1 capsule by mouth 3 (three) times a week.    [provider]  hydrochlorothiazide (HYDRODIURIL) 25 MG tablet Take 1 tablet (25 mg total) by mouth daily. Patient not taking: Reported on 11/16/2018 07/09/17   Lizbeth BarkHairston, Mandesia R, FNP  ibuprofen (ADVIL,MOTRIN) 800 MG tablet Take 1 tablet (800 mg total) by mouth every 8 (eight) hours as needed (Take with food.). Patient not taking: Reported  on 01/16/2017 10/02/16   Lizbeth BarkHairston, Mandesia R, FNP  losartan (COZAAR) 100 MG tablet Take 1 tablet (100 mg total) by mouth daily. Patient not taking: Reported on 11/16/2018 07/09/17   Lizbeth BarkHairston, Mandesia R, FNP  losartan-hydrochlorothiazide (HYZAAR) 100-25 MG tablet Take 1 tablet by mouth daily. 07/08/18   [provider]  Multiple Vitamins-Minerals (CENTRUM SILVER ADULT 50+ PO) Take 1 tablet by mouth daily.    [provider]  naproxen sodium (ALEVE) 220 MG tablet Take 1 tablet (220 mg total) by mouth 2 (two) times daily with a meal. Patient not taking: Reported on 11/16/2018 07/09/17   Lizbeth BarkHairston, Mandesia R, FNP    No current facility-administered medications for this encounter.    Current Outpatient Medications  Medication Sig Dispense Refill  . acetaminophen (TYLENOL) 500 MG tablet Take 1,000 mg by mouth every 6 (six) hours as needed for moderate pain. Reported on 04/21/2016    . amLODipine (NORVASC) 10 MG tablet Take 1 tablet (10 mg total) by mouth daily. 90 tablet 1  . Diclofenac (ZORVOLEX) 35 MG CAPS Take 1 capsule by mouth 3 (three) times a week.    . hydrochlorothiazide (HYDRODIURIL) 25 MG tablet Take 1 tablet (25 mg total) by mouth daily. (Patient not taking: Reported on 11/16/2018) 90 tablet 1  . ibuprofen (ADVIL,MOTRIN) 800 MG tablet Take 1 tablet (800 mg total) by mouth every 8 (eight) hours as needed (Take with food.). (Patient not taking: Reported on 01/16/2017) 30 tablet 0  . losartan (COZAAR) 100 MG tablet Take 1 tablet (100 mg total) by mouth daily. (Patient not taking: Reported on 11/16/2018) 90 tablet 1  . losartan-hydrochlorothiazide (HYZAAR) 100-25 MG tablet Take 1 tablet by mouth daily.  1  . Multiple Vitamins-Minerals (CENTRUM SILVER ADULT 50+ PO) Take 1 tablet by mouth daily.    . naproxen sodium (ALEVE) 220 MG tablet Take 1 tablet (220 mg total) by mouth 2 (two) times daily with a meal. (Patient not taking: Reported on 11/16/2018)      Allergies as of 11/17/2018 - Review  Complete 11/17/2018  Allergen Reaction Noted  . Penicillins Other (See Comments) 07/27/2013    Family History  Problem Relation Age of Onset  . Cancer Father 1765       stomach  . Hypertension Father     Social History   Socioeconomic History  . Marital status: Married    Spouse name: Not on file  . Number of children: Not on file  . Years of education: Not on file  . Highest education level: Not on file  Occupational History  . Not on file  Social Needs  . Financial resource strain: Not on file  . Food insecurity:    Worry: Not on file    Inability: Not  on file  . Transportation needs:    Medical: Not on file    Non-medical: Not on file  Tobacco Use  . Smoking status: Former Smoker    Packs/day: 0.50    Years: 20.00    Pack years: 10.00    Types: Cigarettes    Start date: 04/30/1975    Last attempt to quit: 04/30/1995    Years since quitting: 23.5  . Smokeless tobacco: Never Used  Substance and Sexual Activity  . Alcohol use: No    Alcohol/week: 0.0 standard drinks  . Drug use: No  . Sexual activity: Not on file  Lifestyle  . Physical activity:    Days per week: Not on file    Minutes per session: Not on file  . Stress: Not on file  Relationships  . Social connections:    Talks on phone: Not on file    Gets together: Not on file    Attends religious service: Not on file    Active member of club or organization: Not on file    Attends meetings of clubs or organizations: Not on file    Relationship status: Not on file  . Intimate partner violence:    Fear of current or ex partner: Not on file    Emotionally abused: Not on file    Physically abused: Not on file    Forced sexual activity: Not on file  Other Topics Concern  . Not on file  Social History Narrative  . Not on file    Review of Systems: All systems reviewed and negative except where noted in HPI.  Physical Exam: Vital signs in last 24 hours: Temp:  [98.6 F (37 C)-99 F (37.2 C)] 98.6  F (37 C) (02/02 0818) Pulse Rate:  [80-102] 88 (02/02 1100) Resp:  [14-17] 15 (02/02 0818) BP: (102-137)/(64-92) 113/70 (02/02 1100) SpO2:  [94 %-100 %] 99 % (02/02 1100) Weight:  [104.3 kg] 104.3 kg (02/01 1325)   General:   Alert, female in NAD.  Husband at bedside Psych:  Pleasant, cooperative. Normal mood and affect. Eyes:  Pupils equal, sclera clear, no icterus.   Conjunctiva pink. Ears:  Normal auditory acuity. Nose:  No deformity, discharge,  or lesions. Neck:  Supple; no masses Lungs:  Clear throughout to auscultation.   No wheezes, crackles, or rhonchi.  Heart:  Regular rate and rhythm; no murmurs, no lower extremity edema Abdomen:  Soft, non-distended, nontender, BS active, no palp mass    Rectal:  Deferred  Msk:  Symmetrical without gross deformities. . Neurologic:  Alert and  oriented x4;  grossly normal neurologically. Skin:  Intact without significant lesions or rashes.   Intake/Output from previous day: No intake/output data recorded. Intake/Output this shift: No intake/output data recorded.  Lab Results: Recent Labs    11/16/18 1344 11/17/18 0835  WBC 4.8 8.3  HGB 11.2* 8.9*  HCT 37.2 29.1*  PLT 282 287   BMET Recent Labs    11/16/18 1344 11/17/18 0835  NA 139 140  K 3.3* 3.4*  CL 97* 100  CO2 31 31  GLUCOSE 124* 116*  BUN 18 25*  CREATININE 0.70 0.74  CALCIUM 8.7* 8.5*   LFT Recent Labs    11/17/18 0835  PROT 5.3*  ALBUMIN 2.7*  AST 19  ALT 16  ALKPHOS 42  BILITOT 0.5   PT/INR Recent Labs    11/17/18 0835  LABPROT 19.3*  INR 1.65    Willette ClusterPaula Deeandra Jerry, NP-C @  11/17/2018, 11:24 AM

## 2018-11-17 NOTE — Anesthesia Preprocedure Evaluation (Signed)
Anesthesia Evaluation  Patient identified by MRN, date of birth, ID band Patient awake    Reviewed: Allergy & Precautions, H&P , NPO status , Patient's Chart, lab work & pertinent test results  Airway Mallampati: II   Neck ROM: full    Dental   Pulmonary former smoker,    breath sounds clear to auscultation       Cardiovascular hypertension,  Rhythm:regular Rate:Normal     Neuro/Psych    GI/Hepatic GERD  ,GI bleed   Endo/Other  Morbid obesity  Renal/GU      Musculoskeletal   Abdominal   Peds  Hematology  (+) Blood dyscrasia, anemia ,   Anesthesia Other Findings   Reproductive/Obstetrics                             Anesthesia Physical Anesthesia Plan  ASA: II  Anesthesia Plan: MAC   Post-op Pain Management:    Induction: Intravenous  PONV Risk Score and Plan: 2 and Propofol infusion and Treatment may vary due to age or medical condition  Airway Management Planned: Nasal Cannula  Additional Equipment:   Intra-op Plan:   Post-operative Plan:   Informed Consent: I have reviewed the patients History and Physical, chart, labs and discussed the procedure including the risks, benefits and alternatives for the proposed anesthesia with the patient or authorized representative who has indicated his/her understanding and acceptance.       Plan Discussed with: CRNA, Anesthesiologist and Surgeon  Anesthesia Plan Comments:         Anesthesia Quick Evaluation

## 2018-11-17 NOTE — Op Note (Signed)
[facility] Patient Name: Tamara Russell Procedure Date : 11/17/2018 MRN: [id number] Attending MD: Beverley Fiedler , MD Date of Birth: Oct 30, 1960 CSN: [id number] Age: 58 Admit Type: Inpatient Procedure:                Upper GI endoscopy Indications:              Hematemesis, Hematochezia, Suspected upper                            gastrointestinal bleeding Providers:                Carie Caddy. Rhea Belton, MD, Norman Clay, RN, Lawson Radar,                            Technician, Dairl Ponder, CRNA Referring MD:             [facility] Medicines:                Monitored Anesthesia Care Complications:            No immediate complications. Estimated Blood Loss:     Estimated blood loss was minimal. Procedure:                Pre-Anesthesia Assessment:                           - Prior to the procedure, a History and Physical                            was performed, and patient medications and                            allergies were reviewed. The patient's tolerance of                            previous anesthesia was also reviewed. The risks                            and benefits of the procedure and the sedation                            options and risks were discussed with the patient.                            All questions were answered, and informed consent                            was obtained. Prior Anticoagulants: The patient has                            taken no previous anticoagulant or antiplatelet                            agents. ASA Grade Assessment: II - A patient with  mild systemic disease. After reviewing the risks                            and benefits, the patient was deemed in                            satisfactory condition to undergo the procedure.                           After obtaining informed consent, the endoscope was                            passed under direct vision. Throughout the           procedure, the patient's blood pressure, pulse, and                            oxygen saturations were monitored continuously. The                            GIF-H190 ([id number]) Olympus gastroscope was                            introduced through the mouth, and advanced to the                            second part of duodenum. The upper GI endoscopy was                            accomplished without difficulty. The patient                            tolerated the procedure well. Scope In: Scope Out: Findings:      A partial and non-obstructing Schatzki's ring was found at the       gastroesophageal junction, 35 cm from the incisors.      The exam of the esophagus was otherwise normal.      Three non-bleeding cratered x 2 and superficial x1 gastric ulcers with       no stigmata of bleeding were found at the incisura (1) and in the       gastric antrum (2). The largest lesion was 9 mm in largest dimension.       Biopsies were taken with a cold forceps for histology and Helicobacter       pylori testing (body antrum and incisura).      Moderate inflammation characterized by erythema and 1 clean-based       ulceration was found in the duodenal bulb.      The second portion of the duodenum was normal. Impression:               - Mild, non-obstructing, Schatzki ring at GE                            junction.                           -  Non-bleeding gastric ulcers with no stigmata of                            bleeding. Biopsied.                           - Duodenal ulcer with duodenitis                           - Normal second portion of the duodenum. Moderate Sedation:      N/A Recommendation:           - Return patient to hospital ward for ongoing care.                           - Soft diet.                           - Continue present medications.                           - BID PPI for at least 8 weeks. Would recommend                            daily if NSAIDs are used in  the future.                           - No aspirin, ibuprofen, naproxen, or other                            non-steroidal anti-inflammatory drugs for 8 weeks.                           - Await pathology results to exclude H. Pylori                            infection. Procedure Code(s):        --- Professional ---                           [phone], Esophagogastroduodenoscopy, flexible,                            transoral; with biopsy, single or multiple Diagnosis Code(s):        --- Professional ---                           K22.2, Esophageal obstruction                           K25.9, Gastric ulcer, unspecified as acute or                            chronic, without hemorrhage or perforation                           K29.80, Duodenitis without bleeding  K92.0, Hematemesis                           K92.1, Melena (includes Hematochezia) CPT copyright 2018 [employer]. All rights reserved. The codes documented in this report are preliminary and upon coder review may  be revised to meet current compliance requirements. Beverley FiedlerJay M Nakeita Styles, MD 11/17/2018 3:46:45 PM This report has been signed electronically. Number of Addenda: 0

## 2018-11-17 NOTE — H&P (Signed)
History and Physical    [name] ZOX:[id number] DOB: [birth year] DOA: 11/17/2018  PCP: Renaye RakersBland, Veita, MD Consultants:  None Patient coming from:  Home - lives with husband and his 3 children ([phone], [phone], 6); NOK: Husband, [phone]  Chief Complaint: Rectal bleeding  HPI: [name] is a 58 y.o. female with medical history significant of HTN and GERD presenting with BRBPR.  She has had blood in her stool and has been vomiting.  She was in the ER last night for the same issue and was discharged.  She had another episode last night and again this AM and came back.  There was blood in the emesis.  Also with bright red blood in the stools.  Unsure if there is stool mixed in with it.  Mild abdominal pain in the middle.  Medication given in the ER eased that off.  She has been taking NSAIDs.  No fever.   ED Course:  Stable GI bleed.  Came to ER yesterday, Hgb 11.2,  Today 8.9 with ongoing bleeding.  Mild epigastric pain.  GI to see.  Review of Systems: As per HPI; otherwise review of systems reviewed and negative.   Ambulatory Status:  Ambulates without assistance  Past Medical History:  Diagnosis Date  . Acid reflux   . Hypertension     Past Surgical History:  Procedure Laterality Date  . ABDOMINAL HYSTERECTOMY    . BREAST EXCISIONAL BIOPSY Left    benign  . BREAST EXCISIONAL BIOPSY Right    benign    Social History   Socioeconomic History  . Marital status: Married    Spouse name: Not on file  . Number of children: Not on file  . Years of education: Not on file  . Highest education level: Not on file  Occupational History  . Occupation: unemployed  Social Needs  . Financial resource strain: Not on file  . Food insecurity:    Worry: Not on file    Inability: Not on file  . Transportation needs:    Medical: Not on file    Non-medical: Not on file  Tobacco Use  . Smoking status: Former Smoker    Packs/day: 0.50    Years: 20.00    Pack years: 10.00   Types: Cigarettes    Start date: 04/30/1975    Last attempt to quit: 04/30/1995    Years since quitting: 23.5  . Smokeless tobacco: Never Used  Substance and Sexual Activity  . Alcohol use: No    Alcohol/week: 0.0 standard drinks  . Drug use: No  . Sexual activity: Not on file  Lifestyle  . Physical activity:    Days per week: Not on file    Minutes per session: Not on file  . Stress: Not on file  Relationships  . Social connections:    Talks on phone: Not on file    Gets together: Not on file    Attends religious service: Not on file    Active member of club or organization: Not on file    Attends meetings of clubs or organizations: Not on file    Relationship status: Not on file  . Intimate partner violence:    Fear of current or ex partner: Not on file    Emotionally abused: Not on file    Physically abused: Not on file    Forced sexual activity: Not on file  Other Topics Concern  . Not on file  Social History Narrative  . Not on  file    Allergies  Allergen Reactions  . Penicillins Other (See Comments)    Childhood allergy    Family History  Problem Relation Age of Onset  . Cancer Father [id number]       stomach  . Hypertension Father     Prior to Admission medications   Medication Sig Start Date End Date Taking? Authorizing Provider  acetaminophen (TYLENOL) 500 MG tablet Take 1,000 mg by mouth every 6 (six) hours as needed for moderate pain. Reported on 04/21/2016    [provider]  amLODipine (NORVASC) 10 MG tablet Take 1 tablet (10 mg total) by mouth daily. 07/09/17   Lizbeth BarkHairston, Mandesia R, FNP  Diclofenac (ZORVOLEX) 35 MG CAPS Take 1 capsule by mouth 3 (three) times a week.    [provider]  hydrochlorothiazide (HYDRODIURIL) 25 MG tablet Take 1 tablet (25 mg total) by mouth daily. Patient not taking: Reported on 11/16/2018 07/09/17   Lizbeth BarkHairston, Mandesia R, FNP  ibuprofen (ADVIL,MOTRIN) 800 MG tablet Take 1 tablet (800 mg total) by mouth every 8 (eight)  hours as needed (Take with food.). Patient not taking: Reported on 01/16/2017 10/02/16   Lizbeth BarkHairston, Mandesia R, FNP  losartan (COZAAR) 100 MG tablet Take 1 tablet (100 mg total) by mouth daily. Patient not taking: Reported on 11/16/2018 07/09/17   Lizbeth BarkHairston, Mandesia R, FNP  losartan-hydrochlorothiazide (HYZAAR) 100-25 MG tablet Take 1 tablet by mouth daily. 07/08/18   [provider]  Multiple Vitamins-Minerals (CENTRUM SILVER ADULT 50+ PO) Take 1 tablet by mouth daily.    [provider]  naproxen sodium (ALEVE) 220 MG tablet Take 1 tablet (220 mg total) by mouth 2 (two) times daily with a meal. Patient not taking: Reported on 11/16/2018 07/09/17   Lizbeth BarkHairston, Mandesia R, FNP    Physical Exam: Vitals:   11/17/18 1030 11/17/18 1100 11/17/18 1200 11/17/18 1230  BP: 107/80 113/70 119/81 112/72  Pulse: 89 88 (!) 103 98  Resp:      Temp:      TempSrc:      SpO2: 95% 99% 98% 99%     . General:  Appears calm and comfortable and is NAD . Eyes:  PERRL, EOMI, normal lids, iris . ENT:  grossly normal hearing, lips & tongue, mmm; appropriate dentition (artificial) . Neck:  no LAD, masses or thyromegaly . Cardiovascular:  RRR, no m/r/g. No LE edema.  Marland Kitchen. Respiratory:   CTA bilaterally with no wheezes/rales/rhonchi.  Normal respiratory effort. . Abdomen:  soft, NT, ND, NABS, obese . Skin:  no rash or induration seen on limited exam . Musculoskeletal:  grossly normal tone BUE/BLE, good ROM, no bony abnormality . Psychiatric:  grossly normal mood and affect, speech fluent and appropriate, AOx3 . Neurologic:  CN 2-12 grossly intact, moves all extremities in coordinated fashion, sensation intact    Radiological Exams on Admission: No results found.  EKG: not done   Labs on Admission: I have personally reviewed the available labs and imaging studies at the time of the admission.  Pertinent labs:   Glucose 116 BUN 25/Creatinine 0.74/GFR >60 WBC 8.3 Hgb 8.9; 11.2 yesterday; 15.0 in  2/17 INR 1.65 Heme positive  Assessment/Plan Principal Problem:   GI bleed Active Problems:   GERD (gastroesophageal reflux disease)   Hypertension   GI bleed -Patient presented to the ER yesterday and today with recurrent bleeding - both hematemesis and melena vs. Hematochezia. -This creates concern for a more brisk bleed, possibly in the duodenum. -She has been taking regular  NSAIDs - she reports diclofenac. -Prior normal Hgb (none recent) with Hgb 11.2 yesterday and 8.9 today.  Will trend q6h with plan to transfuse if downtrending and patient continues to bleed. -Type and screen were done in ED.  - will observe overnight - GI consulted by ED, likely to perform EGD today vs. tomorrow - NPO for possible EGD - NS at 100 mL/hr - Start IV pantoprazole  Bolus and drip - Zofran IV for nausea - Avoid NSAIDs and SQ heparin - Maintain IV access (2 large bore IVs if possible). - Monitor closely and follow q6h cbc, transfuse as necessary.  GERD -She appears to have stopped PPI therapy due to cost  HTN -Continue home meds - CCB, ACE, HCTZ   DVT prophylaxis:  SCDs Code Status:  Full - confirmed with patient/family Family Communication: Husband present throughout evalaution Disposition Plan:  Home once clinically improved Consults called: GI  Admission status: It is my clinical opinion that referral for OBSERVATION is reasonable and necessary in this patient based on the above information provided. The aforementioned taken together are felt to place the patient at high risk for further clinical deterioration. However it is anticipated that the patient may be medically stable for discharge from the hospital within 24 to 48 hours.     Jonah Blue MD [facility]   How to contact the [facility] Attending or Consulting provider 7A - 7P or covering provider during after hours 7P -7A, for this patient?  1. Check the care team in [facility] and look for a) attending/consulting TRH provider  listed and b) the [facility] team listed 2. Log into [email or web address] and use [place]'s universal password to access. If you do not have the password, please contact the hospital operator. 3. Locate the [facility] provider you are looking for under [facility] and page to a number that you can be directly reached. 4. If you still have difficulty reaching the provider, please page the [facility] (Director on Call) for the Hospitalists listed on amion for assistance.   11/17/2018, 12:54 PM

## 2018-11-17 NOTE — Anesthesia Postprocedure Evaluation (Signed)
Anesthesia Post Note  Patient: Tamara Russell  Procedure(s) Performed: ESOPHAGOGASTRODUODENOSCOPY (EGD) WITH PROPOFOL (N/A ) BIOPSY     Patient location during evaluation: Endoscopy Anesthesia Type: MAC Level of consciousness: awake and alert Pain management: pain level controlled Vital Signs Assessment: post-procedure vital signs reviewed and stable Respiratory status: spontaneous breathing, nonlabored ventilation, respiratory function stable and patient connected to nasal cannula oxygen Cardiovascular status: blood pressure returned to baseline and stable Postop Assessment: no apparent nausea or vomiting Anesthetic complications: no    Last Vitals:  Vitals:   11/17/18 1610 11/17/18 1723  BP: 99/63 120/64  Pulse: 95 96  Resp: (!) 21 20  Temp:  36.8 C  SpO2: 100% 92%    Last Pain:  Vitals:   11/17/18 1723  TempSrc: Oral  PainSc:                  [place]

## 2018-11-17 NOTE — Transfer of Care (Signed)
Immediate Anesthesia Transfer of Care Note  Patient: [place]  Procedure(s) Performed: ESOPHAGOGASTRODUODENOSCOPY (EGD) WITH PROPOFOL (N/A ) BIOPSY  Patient Location: Endoscopy Unit  Anesthesia Type:MAC  Level of Consciousness: awake, alert  and oriented  Airway & Oxygen Therapy: Patient Spontanous Breathing and Patient connected to nasal cannula oxygen  Post-op Assessment: Report given to RN and Post -op Vital signs reviewed and stable  Post vital signs: Reviewed and stable  Last Vitals:  Vitals Value Taken Time  BP    Temp    Pulse 103 11/17/2018  3:50 PM  Resp 25 11/17/2018  3:50 PM  SpO2 100 % 11/17/2018  3:50 PM  Vitals shown include unvalidated device data.  Last Pain:  Vitals:   11/17/18 1547  TempSrc: Oral  PainSc: 0-No pain         Complications: No apparent anesthesia complications

## 2018-11-17 NOTE — ED Provider Notes (Addendum)
[facility] EMERGENCY DEPARTMENT Provider Note   CSN: [id number] Arrival date & time: 11/17/18  0810     History   Chief Complaint Chief Complaint  Patient presents with  . Rectal Bleeding    HPI Tamara Russell is a 58 y.o. female.  Patient with history of GERD presents the emergency department today with ongoing bright red blood per rectum and hematemesis.  Patient was seen in the emergency department yesterday for similar symptoms.  Her hemoglobin was 11.2 at that time.  Patient felt well enough to go home.  She was advised to stop taking the diclofenac that she is on for arthritis and monitor symptoms closely.  Patient has had 3 additional episodes of hematochezia (last at 7:30am) and one episode of vomiting.  She has associated mild upper abdominal pain that does not change with food.  Patient has noticed some lightheadedness with standing and some exertional shortness of breath.  She feels that the amount of blood in the stool was "large".  She has not noticed any blood in the urine or vaginal bleeding.  No easy bruising or bleeding.  Patient reports colonoscopy 5 to 7 years ago.  Diclofenac is 35 mg tablets which patient reports taking once a day chronically.  She has never needed a blood transfusion.     Past Medical History:  Diagnosis Date  . Acid reflux   . Hypertension     Patient Active Problem List   Diagnosis Date Noted  . GERD (gastroesophageal reflux disease) 11/19/2015  . Acute respiratory failure with hypoxia (HCC) 11/19/2015  . AKI (acute kidney injury) (HCC) 11/19/2015  . Acute bronchitis 11/19/2015  . HTN (hypertension) 04/01/2015    Past Surgical History:  Procedure Laterality Date  . ABDOMINAL HYSTERECTOMY    . BREAST EXCISIONAL BIOPSY Left    benign  . BREAST EXCISIONAL BIOPSY Right    benign     OB History   No obstetric history on file.      Home Medications    Prior to Admission medications   Medication Sig  Start Date End Date Taking? Authorizing Provider  acetaminophen (TYLENOL) 500 MG tablet Take 1,000 mg by mouth every 6 (six) hours as needed for moderate pain. Reported on 04/21/2016    [provider]  amLODipine (NORVASC) 10 MG tablet Take 1 tablet (10 mg total) by mouth daily. 07/09/17   Lizbeth Bark, FNP  Diclofenac (ZORVOLEX) 35 MG CAPS Take 1 capsule by mouth 3 (three) times a week.    [provider]  hydrochlorothiazide (HYDRODIURIL) 25 MG tablet Take 1 tablet (25 mg total) by mouth daily. Patient not taking: Reported on 11/16/2018 07/09/17   Lizbeth Bark, FNP  ibuprofen (ADVIL,MOTRIN) 800 MG tablet Take 1 tablet (800 mg total) by mouth every 8 (eight) hours as needed (Take with food.). Patient not taking: Reported on 01/16/2017 10/02/16   Lizbeth Bark, FNP  losartan (COZAAR) 100 MG tablet Take 1 tablet (100 mg total) by mouth daily. Patient not taking: Reported on 11/16/2018 07/09/17   Lizbeth Bark, FNP  losartan-hydrochlorothiazide (HYZAAR) 100-25 MG tablet Take 1 tablet by mouth daily. 07/08/18   [provider]  Multiple Vitamins-Minerals (CENTRUM SILVER ADULT 50+ PO) Take 1 tablet by mouth daily.    [provider]  naproxen sodium (ALEVE) 220 MG tablet Take 1 tablet (220 mg total) by mouth 2 (two) times daily with a meal. Patient not taking: Reported on 11/16/2018 07/09/17   Hairston,  Oren BeckmannMandesia R, FNP    Family History Family History  Problem Relation Age of Onset  . Cancer Father 5765       stomach  . Hypertension Father     Social History Social History   Tobacco Use  . Smoking status: Former Smoker    Packs/day: 0.50    Years: 20.00    Pack years: 10.00    Types: Cigarettes    Start date: 04/30/1975    Last attempt to quit: 04/30/1995    Years since quitting: 23.5  . Smokeless tobacco: Never Used  Substance Use Topics  . Alcohol use: No    Alcohol/week: 0.0 standard drinks  . Drug use: No     Allergies     Penicillins   Review of Systems Review of Systems  Constitutional: Negative for fever.  HENT: Negative for rhinorrhea and sore throat.   Eyes: Negative for redness.  Respiratory: Positive for shortness of breath. Negative for cough.   Cardiovascular: Negative for chest pain.  Gastrointestinal: Positive for blood in stool, nausea and vomiting. Negative for abdominal pain and diarrhea.  Genitourinary: Negative for dysuria, hematuria and vaginal bleeding.  Musculoskeletal: Negative for myalgias.  Skin: Negative for rash.  Neurological: Positive for light-headedness. Negative for headaches.  Hematological: Does not bruise/bleed easily.     Physical Exam Updated Vital Signs BP (!) 118/92   Pulse (!) 102   Temp 98.6 F (37 C) (Oral)   Resp 15   SpO2 100%   Physical Exam Vitals signs and nursing note reviewed.  Constitutional:      Appearance: She is well-developed.  HENT:     Head: Normocephalic and atraumatic.  Eyes:     General:        Right eye: No discharge.        Left eye: No discharge.     Conjunctiva/sclera: Conjunctivae normal.  Neck:     Musculoskeletal: Normal range of motion and neck supple.  Cardiovascular:     Rate and Rhythm: Normal rate and regular rhythm.     Heart sounds: Normal heart sounds.  Pulmonary:     Effort: Pulmonary effort is normal.     Breath sounds: Normal breath sounds.  Abdominal:     Palpations: Abdomen is soft.     Tenderness: There is abdominal tenderness. There is no guarding or rebound.     Comments: Minimal epigastric tenderness to palpation.  Skin:    General: Skin is warm and dry.  Neurological:     Mental Status: She is alert.      ED Treatments / Results  Labs (all labs ordered are listed, but only abnormal results are displayed) Labs Reviewed  CBC WITH DIFFERENTIAL/PLATELET - Abnormal; Notable for the following components:      Result Value   RBC 3.70 (*)    Hemoglobin 8.9 (*)    HCT 29.1 (*)    MCV 78.6 (*)     MCH 24.1 (*)    RDW 15.8 (*)    All other components within normal limits  COMPREHENSIVE METABOLIC PANEL - Abnormal; Notable for the following components:   Potassium 3.4 (*)    Glucose, Bld 116 (*)    BUN 25 (*)    Calcium 8.5 (*)    Total Protein 5.3 (*)    Albumin 2.7 (*)    All other components within normal limits  PROTIME-INR - Abnormal; Notable for the following components:   Prothrombin Time 19.3 (*)    All other  components within normal limits  TYPE AND SCREEN  ABO/RH    EKG None  Radiology No results found.  Procedures Procedures (including critical care time)  Medications Ordered in ED Medications  pantoprazole (PROTONIX) 80 mg in sodium chloride 0.9 % 100 mL IVPB (0 mg Intravenous Stopped 11/17/18 1026)     Initial Impression / Assessment and Plan / ED Course  I have reviewed the triage vital signs and the nursing notes.  Pertinent labs & imaging results that were available during my care of the patient were reviewed by me and considered in my medical decision making (see chart for details).     Patient seen and examined. Work-up initiated.  Patient currently appears hemodynamically stable.  Anticipate admission to the hospital for monitoring and work-up of GI bleed.  Stools were confirmed to be heme positive yesterday.  Vital signs reviewed and are as follows: BP (!) 118/92   Pulse (!) 102   Temp 98.6 F (37 C) (Oral)   Resp 15   SpO2 100%   10:07 AM Pt updated on results. Requested consult from [facility]. Awaiting CMP results and will admit.   10:33 AM Spoke with Dr. Ophelia Charter who will evaluate patient.   Final Clinical Impressions(s) / ED Diagnoses   Final diagnoses:  Acute GI bleeding  Acute blood loss anemia   Stable, active GI bleed.   ED Discharge Orders    None        Renne Crigler, PA-C 11/17/18 1034    [address], DO 11/17/18 1107

## 2018-11-17 NOTE — ED Notes (Signed)
ED Provider at bedside. 

## 2018-11-17 NOTE — ED Triage Notes (Signed)
Pt reports continued bright red rectal bleeding x3 since yesterday, was seen here for the same yesterday, sent home and told she needs to f/u with GI. Also endorses nausea, denies abd pain.

## 2018-11-18 DIAGNOSIS — K250 Acute gastric ulcer with hemorrhage: Principal | ICD-10-CM

## 2018-11-18 LAB — CBC
HCT: 22.7 % — ABNORMAL LOW (ref 36.0–46.0)
Hemoglobin: 6.8 g/dL — CL (ref 12.0–15.0)
MCH: 23.4 pg — ABNORMAL LOW (ref 26.0–34.0)
MCHC: 30 g/dL (ref 30.0–36.0)
MCV: 78.3 fL — AB (ref 80.0–100.0)
Platelets: 227 10*3/uL (ref 150–400)
RBC: 2.9 MIL/uL — ABNORMAL LOW (ref 3.87–5.11)
RDW: 15.9 % — ABNORMAL HIGH (ref 11.5–15.5)
WBC: 7.3 10*3/uL (ref 4.0–10.5)
nRBC: 0 % (ref 0.0–0.2)

## 2018-11-18 LAB — BASIC METABOLIC PANEL
Anion gap: 10 (ref 5–15)
BUN: 16 mg/dL (ref 6–20)
CALCIUM: 7.9 mg/dL — AB (ref 8.9–10.3)
CO2: 30 mmol/L (ref 22–32)
Chloride: 102 mmol/L (ref 98–111)
Creatinine, Ser: 0.63 mg/dL (ref 0.44–1.00)
GFR calc Af Amer: >60 mL/min (ref >60)
GFR calc non Af Amer: >60 mL/min (ref >60)
Glucose, Bld: 97 mg/dL (ref 70–99)
Potassium: 3 mmol/L — ABNORMAL LOW (ref 3.5–5.1)
Sodium: 142 mmol/L (ref 135–145)

## 2018-11-18 LAB — HIV ANTIBODY (ROUTINE TESTING W REFLEX): HIV Screen 4th Generation wRfx: NONREACTIVE

## 2018-11-18 LAB — PROTIME-INR
INR: 1.1
Prothrombin Time: 14.1 seconds (ref 11.4–15.2)

## 2018-11-18 LAB — PREPARE RBC (CROSSMATCH)

## 2018-11-18 LAB — HEMOGLOBIN AND HEMATOCRIT, BLOOD
HCT: 30.3 % — ABNORMAL LOW (ref 36.0–46.0)
Hemoglobin: 9.6 g/dL — ABNORMAL LOW (ref 12.0–15.0)

## 2018-11-18 MED ORDER — SODIUM CHLORIDE 0.9% IV SOLUTION
Freq: Once | INTRAVENOUS | Status: AC
  Administered 2018-11-18: 06:00:00 via INTRAVENOUS

## 2018-11-18 MED ORDER — POTASSIUM CHLORIDE CRYS ER 20 MEQ PO TBCR
40.0000 meq | EXTENDED_RELEASE_TABLET | Freq: Once | ORAL | Status: AC
  Administered 2018-11-18: 40 meq via ORAL
  Filled 2018-11-18: qty 2

## 2018-11-18 NOTE — Progress Notes (Signed)
CRITICAL VALUE ALERT  Critical Value:  Hgb 6.8  Date & Time Notied:  11/18/18 @0538   Provider Notified: Linton FlemingsX Blount (NP)  Orders Received/Actions taken: Yes see EPIC

## 2018-11-18 NOTE — Progress Notes (Signed)
Progress Note    Tamara Russell  ZOX:[id number] DOB: 10-Sep-1961  DOA: 11/17/2018 PCP: Renaye RakersBland, Veita, MD    Brief Narrative:    Medical records reviewed and are as summarized below:  Tamara Russell is an 58 y.o. female s/p EGD for GI bleed.  Assessment/Plan:   Principal Problem:   Acute GI bleeding Active Problems:   GERD (gastroesophageal reflux disease)   Hypertension   Acute blood loss anemia   Acute gastric ulcer with hemorrhage   Duodenal ulcer  Hematemesis, melena in setting of NSAID use.   -s/p EGD: Nonbleeding gastric ulcers, duodenal ulcer, duodenitis.  Nonobstructing Schatzki's ring at [employer] junction.  -biopsy pending -GI consult appreciated:Twice daily PPI for at least 8 weeks. If she ever resumes NSAIDs she should accompany these with once daily PPI  ABLA -s/p 2 units PRBC today -recheck H/H pending -INR was elevated at admission-- no LFTs abnormal, ? From NSAIDS  GERD -will need PPO BID x 8 weeks and then daily after if taking NSAIDS  HTN -Continue home meds - CCB, ACE, HCTZ  Obesity Estimated body mass index is 42.75 kg/m as calculated from the following:   Height as of this encounter: 5' 1.5" (1.562 m).   Weight as of 11/16/18: 104.3 kg.  Family Communication/Anticipated D/C date and plan/Code Status   DVT prophylaxis: scd Code Status: Full Code.  Family Communication:  Disposition Plan: home once ok with GI/hemoglobin stable/repeat INR improved   Medical Consultants:    GI  Subjective:   Still feeling tired  Objective:    Vitals:   11/18/18 1015 11/18/18 1100 11/18/18 1335 11/18/18 1358  BP: 116/65 116/87 132/73   Pulse: 89 86 80   Resp: 16 16 16    Temp: 98.3 F (36.8 C) 98.2 F (36.8 C) 98.3 F (36.8 C)   TempSrc: Oral Oral Oral   SpO2: 97% 98% 100%   Height:    5' 1.5" (1.562 m)    Intake/Output Summary (Last 24 hours) at 11/18/2018 1510 Last data filed at 11/18/2018 1330 Gross per 24 hour  Intake 1348.05 ml    Output -  Net 1348.05 ml   There were no vitals filed for this visit.  Exam: In bed,NAD-- finishing blood transfusion rrr Clear, no wheezing Pleasant and cooperative  Data Reviewed:   I have personally reviewed following labs and imaging studies:  Labs: Labs show the following:   Basic Metabolic Panel: Recent Labs  Lab 11/16/18 1344 11/17/18 0835 11/18/18 0435  NA 139 140 142  K 3.3* 3.4* 3.0*  CL 97* 100 102  CO2 31 31 30   GLUCOSE 124* 116* 97  BUN 18 25* 16  CREATININE 0.70 0.74 0.63  CALCIUM 8.7* 8.5* 7.9*   GFR Estimated Creatinine Clearance: 83.9 mL/min (by C-G formula based on SCr of 0.63 mg/dL). Liver Function Tests: Recent Labs  Lab 11/16/18 1344 11/17/18 0835  AST 27 19  ALT 19 16  ALKPHOS 46 42  BILITOT 0.6 0.5  PROT 5.6* 5.3*  ALBUMIN 2.8* 2.7*   Recent Labs  Lab 11/16/18 1344  LIPASE 19   No results for input(s): AMMONIA in the last 168 hours. Coagulation profile Recent Labs  Lab 11/17/18 0835  INR 1.65    CBC: Recent Labs  Lab 11/16/18 1344 11/17/18 0835 11/17/18 1324 11/18/18 0435  WBC 4.8 8.3 7.6 7.3  NEUTROABS 3.5 6.5  --   --   HGB 11.2* 8.9* 8.4* 6.8*  HCT 37.2 29.1* 26.5* 22.7*  MCV 78.5* 78.6* 77.5* 78.3*  PLT 282 287 256 227   Cardiac Enzymes: No results for input(s): CKTOTAL, CKMB, CKMBINDEX, TROPONINI in the last 168 hours. BNP (last 3 results) No results for input(s): PROBNP in the last 8760 hours. CBG: No results for input(s): GLUCAP in the last 168 hours. D-Dimer: No results for input(s): DDIMER in the last 72 hours. Hgb A1c: No results for input(s): HGBA1C in the last 72 hours. Lipid Profile: No results for input(s): CHOL, HDL, LDLCALC, TRIG, CHOLHDL, LDLDIRECT in the last 72 hours. Thyroid function studies: No results for input(s): TSH, T4TOTAL, T3FREE, THYROIDAB in the last 72 hours.  Invalid input(s): FREET3 Anemia work up: Recent Labs    11/17/18 1324  VITAMINB12 728  FOLATE 33.3  FERRITIN  29  TIBC 277  IRON 58  RETICCTPCT 2.3   Sepsis Labs: Recent Labs  Lab 11/16/18 1344 11/17/18 0835 11/17/18 1324 11/18/18 0435  WBC 4.8 8.3 7.6 7.3    Microbiology No results found for this or any previous visit (from the past 240 hour(s)).  Procedures and diagnostic studies:  No results found.  Medications:   . amLODipine  10 mg Oral Daily  . losartan  100 mg Oral Daily   And  . hydrochlorothiazide  25 mg Oral Daily  . pantoprazole (PROTONIX) IV  40 mg Intravenous Q12H   Continuous Infusions:   LOS: 0 days   Joseph Art  [facility]   How to contact the [facility] Attending or Consulting provider 7A - 7P or covering provider during after hours 7P -7A, for this patient?  1. Check the care team in [facility] and look for a) attending/consulting TRH provider listed and b) the [facility] team listed 2. Log into [email or web address] and use [place]'s universal password to access. If you do not have the password, please contact the hospital operator. 3. Locate the [facility] provider you are looking for under [facility] and page to a number that you can be directly reached. 4. If you still have difficulty reaching the provider, please page the [facility] (Director on Call) for the Hospitalists listed on amion for assistance.  11/18/2018, 3:10 PM

## 2018-11-18 NOTE — Progress Notes (Signed)
Daily Rounding Note  11/18/2018, 12:13 PM  LOS: 0 days   SUBJECTIVE:   Chief complaint: Gastric and duodenal ulcers.    Nurse reports patient had a smear of dark stool this morning.  Patient has not had any large bowel movements in more than 24 hours.  Having some discomfort in her left abdomen at times, this is not severe.  She feels a bit tired but overall feels better than she did.  No nausea, tolerating solid food.  OBJECTIVE:         Vital signs in last 24 hours:    Temp:  [98.1 F (36.7 C)-99.7 F (37.6 C)] 98.2 F (36.8 C) (02/03 1100) Pulse Rate:  [74-109] 86 (02/03 1100) Resp:  [16-25] 16 (02/03 1100) BP: (87-129)/(34-104) 116/87 (02/03 1100) SpO2:  [92 %-100 %] 98 % (02/03 1100) Last BM Date: 11/17/18 There were no vitals filed for this visit. General: Comfortable, obese, does not look acutely ill. Heart: RRR. Chest: Clear bilaterally.  No cough or labored breathing. Abdomen: Soft.  Mild left sided tenderness without guarding or rebound.  Obese.  Active bowel sounds. Extremities: No CCE. Neuro/Psych: Fully alert and oriented.  No gross deficits, no tremors and moves all 4 limbs easily.  Intake/Output from previous day: 02/02 0701 - 02/03 0700 In: 703.1 [P.O.:240; I.V.:463.1] Out: -   Intake/Output this shift: Total I/O In: 315 [Blood:315] Out: -   Lab Results: Recent Labs    11/17/18 0835 11/17/18 1324 11/18/18 0435  WBC 8.3 7.6 7.3  HGB 8.9* 8.4* 6.8*  HCT 29.1* 26.5* 22.7*  PLT 287 256 227   BMET Recent Labs    11/16/18 1344 11/17/18 0835 11/18/18 0435  NA 139 140 142  K 3.3* 3.4* 3.0*  CL 97* 100 102  CO2 31 31 30   GLUCOSE 124* 116* 97  BUN 18 25* 16  CREATININE 0.70 0.74 0.63  CALCIUM 8.7* 8.5* 7.9*   LFT Recent Labs    11/16/18 1344 11/17/18 0835  PROT 5.6* 5.3*  ALBUMIN 2.8* 2.7*  AST 27 19  ALT 19 16  ALKPHOS 46 42  BILITOT 0.6 0.5   PT/INR Recent Labs   11/17/18 0835  LABPROT 19.3*  INR 1.65   Scheduled Meds: . amLODipine  10 mg Oral Daily  . losartan  100 mg Oral Daily   And  . hydrochlorothiazide  25 mg Oral Daily  . pantoprazole (PROTONIX) IV  40 mg Intravenous Q12H   Continuous Infusions: PRN Meds:.acetaminophen **OR** acetaminophen, ondansetron **OR** ondansetron (ZOFRAN) IV   ASSESMENT:   *   Hematemesis, melena in setting of NSAID use.   11/17/2018 EGD: Nonbleeding gastric ulcers, duodenal ulcer, duodenitis.  Nonobstructing Schatzki's ring at [employer] junction.  Gastric biopsies pending.  *    Microcytic anemia.  Hgb 11.2 >> 6.8.  PRBCs x 2 this AM.    *     Remote history of colon polyps.   PLAN   *   No NSAIDs, aspirin.  *    Continue IV protonix BID for now. Twice daily PPI for at least 8 weeks. If she ever resumes NSAIDs she should accompany these with once daily PPI.  *    Await pathology from stomach biopsies, specifically rule out H. Pylori  *   In future will need outpt colonoscopy and possible repeat EGD to assess healing of ulcers after PPI.  Will arrange ROV with NP/PA and make arrangements for future endoscopies at  that time.       Jennye MoccasinSarah Marybell Robards  11/18/2018, 12:13 PM Phone [phone]

## 2018-11-19 DIAGNOSIS — Z6841 Body Mass Index (BMI) 40.0 and over, adult: Secondary | ICD-10-CM | POA: Diagnosis not present

## 2018-11-19 DIAGNOSIS — K921 Melena: Secondary | ICD-10-CM | POA: Diagnosis present

## 2018-11-19 DIAGNOSIS — K250 Acute gastric ulcer with hemorrhage: Secondary | ICD-10-CM | POA: Diagnosis present

## 2018-11-19 DIAGNOSIS — K222 Esophageal obstruction: Secondary | ICD-10-CM | POA: Diagnosis present

## 2018-11-19 DIAGNOSIS — Z90710 Acquired absence of both cervix and uterus: Secondary | ICD-10-CM | POA: Diagnosis not present

## 2018-11-19 DIAGNOSIS — K2981 Duodenitis with bleeding: Secondary | ICD-10-CM | POA: Diagnosis present

## 2018-11-19 DIAGNOSIS — K264 Chronic or unspecified duodenal ulcer with hemorrhage: Secondary | ICD-10-CM | POA: Diagnosis present

## 2018-11-19 DIAGNOSIS — D62 Acute posthemorrhagic anemia: Secondary | ICD-10-CM

## 2018-11-19 DIAGNOSIS — Z79899 Other long term (current) drug therapy: Secondary | ICD-10-CM | POA: Diagnosis not present

## 2018-11-19 DIAGNOSIS — T39395A Adverse effect of other nonsteroidal anti-inflammatory drugs [NSAID], initial encounter: Secondary | ICD-10-CM | POA: Diagnosis present

## 2018-11-19 DIAGNOSIS — Z87891 Personal history of nicotine dependence: Secondary | ICD-10-CM | POA: Diagnosis not present

## 2018-11-19 DIAGNOSIS — K922 Gastrointestinal hemorrhage, unspecified: Secondary | ICD-10-CM | POA: Diagnosis present

## 2018-11-19 DIAGNOSIS — E876 Hypokalemia: Secondary | ICD-10-CM | POA: Diagnosis present

## 2018-11-19 DIAGNOSIS — K219 Gastro-esophageal reflux disease without esophagitis: Secondary | ICD-10-CM | POA: Diagnosis present

## 2018-11-19 DIAGNOSIS — I10 Essential (primary) hypertension: Secondary | ICD-10-CM | POA: Diagnosis present

## 2018-11-19 LAB — CBC
HCT: 28.5 % — ABNORMAL LOW (ref 36.0–46.0)
Hemoglobin: 8.8 g/dL — ABNORMAL LOW (ref 12.0–15.0)
MCH: 25.2 pg — ABNORMAL LOW (ref 26.0–34.0)
MCHC: 30.9 g/dL (ref 30.0–36.0)
MCV: 81.7 fL (ref 80.0–100.0)
PLATELETS: 215 10*3/uL (ref 150–400)
RBC: 3.49 MIL/uL — ABNORMAL LOW (ref 3.87–5.11)
RDW: 16.5 % — ABNORMAL HIGH (ref 11.5–15.5)
WBC: 8.3 10*3/uL (ref 4.0–10.5)
nRBC: 0.2 % (ref 0.0–0.2)

## 2018-11-19 LAB — TYPE AND SCREEN
ABO/RH(D): O POS
Antibody Screen: NEGATIVE
Unit division: 0
Unit division: 0

## 2018-11-19 LAB — BASIC METABOLIC PANEL
Anion gap: 6 (ref 5–15)
BUN: 11 mg/dL (ref 6–20)
CO2: 32 mmol/L (ref 22–32)
Calcium: 8.2 mg/dL — ABNORMAL LOW (ref 8.9–10.3)
Chloride: 103 mmol/L (ref 98–111)
Creatinine, Ser: 0.69 mg/dL (ref 0.44–1.00)
GFR calc Af Amer: >60 mL/min (ref >60)
GFR calc non Af Amer: >60 mL/min (ref >60)
Glucose, Bld: 95 mg/dL (ref 70–99)
Potassium: 3.1 mmol/L — ABNORMAL LOW (ref 3.5–5.1)
Sodium: 141 mmol/L (ref 135–145)

## 2018-11-19 LAB — BPAM RBC
Blood Product Expiration Date: 202003052359
Blood Product Expiration Date: 202003052359
ISSUE DATE / TIME: 202002030618
ISSUE DATE / TIME: 202002031039
Unit Type and Rh: 5100
Unit Type and Rh: 5100

## 2018-11-19 LAB — MAGNESIUM: Magnesium: 2 mg/dL (ref 1.7–2.4)

## 2018-11-19 MED ORDER — SODIUM CHLORIDE 0.9 % IV SOLN
510.0000 mg | Freq: Once | INTRAVENOUS | Status: AC
  Administered 2018-11-19: 510 mg via INTRAVENOUS
  Filled 2018-11-19: qty 17

## 2018-11-19 MED ORDER — PANTOPRAZOLE SODIUM 40 MG PO TBEC
40.0000 mg | DELAYED_RELEASE_TABLET | Freq: Two times a day (BID) | ORAL | Status: DC
  Administered 2018-11-19: 40 mg via ORAL
  Filled 2018-11-19: qty 1

## 2018-11-19 MED ORDER — LOSARTAN POTASSIUM 100 MG PO TABS: 100.0000 mg | ORAL_TABLET | Freq: Every day | ORAL | 0 refills | Status: DC

## 2018-11-19 MED ORDER — PANTOPRAZOLE SODIUM 40 MG PO TBEC: 40.0000 mg | DELAYED_RELEASE_TABLET | Freq: Two times a day (BID) | ORAL | 1 refills | Status: DC

## 2018-11-19 MED ORDER — POTASSIUM CHLORIDE CRYS ER 20 MEQ PO TBCR
40.0000 meq | EXTENDED_RELEASE_TABLET | ORAL | Status: DC
  Administered 2018-11-19: 40 meq via ORAL
  Filled 2018-11-19: qty 2

## 2018-11-19 MED FILL — PANTOPRAZOLE SOD DR 40 MG T: 40 | 30 days supply | Qty: 60 | Fill #0

## 2018-11-19 MED FILL — LOSARTAN POTASSIUM 100 MG T: 100 | 30 days supply | Qty: 30 | Fill #0

## 2018-11-19 NOTE — Progress Notes (Signed)
Pt requesting for daily blood pressure medications.Pt states has headache due to blood pressure.  Pt forgot blood pressure meds where given in am. Pt blood pressure taken and vitals were WDL (see flowsheet).

## 2018-11-19 NOTE — Progress Notes (Signed)
[facility] Progress Note  Chief Complaint: Gastric ulcers  History:  She has not had a BM in a few days, no ongoing bleeding. No nausea vomiting or epigastric pain.   ROS: Cardiovascular:  no chest pain Respiratory: no dyspnea  Objective:  Med list reviewed  Vital signs in last 24 hrs: Vitals:   11/19/18 0200 11/19/18 0540  BP: 134/80 (!) 102/56  Pulse:  81  Resp:  18  Temp:  97.6 F (36.4 C)  SpO2:  93%    Physical Exam   HEENT: sclera anicteric, oral mucosa without lesions  Neck: supple, no thyromegaly, JVD or lymphadenopathy  Cardiac: RRR without murmurs, S1S2 heard, no peripheral edema  Pulm: clear to auscultation bilaterally, normal RR and effort noted  Abdomen: Morbidly obese, limiting exam soft, no tenderness, with active bowel sounds.  Skin; warm and dry, no jaundice  Recent Labs:  Recent Labs  Lab 11/17/18 1324 11/18/18 0435 11/18/18 1553 11/19/18 0541  WBC 7.6 7.3  --  8.3  HGB 8.4* 6.8* 9.6* 8.8*  HCT 26.5* 22.7* 30.3* 28.5*  PLT 256 227  --  215   Recent Labs  Lab 11/18/18 1553  INR 1.10   CMP     Component Value Date/Time   NA 141 11/19/2018 0541   NA 140 03/05/2017 1449   K 3.1 (L) 11/19/2018 0541   CL 103 11/19/2018 0541   CO2 32 11/19/2018 0541   GLUCOSE 95 11/19/2018 0541   BUN 11 11/19/2018 0541   BUN 7 03/05/2017 1449   CREATININE 0.69 11/19/2018 0541   CREATININE 0.57 04/21/2016 1528   CALCIUM 8.2 (L) 11/19/2018 0541   PROT 5.3 (L) 11/17/2018 0835   ALBUMIN 2.7 (L) 11/17/2018 0835   AST 19 11/17/2018 0835   ALT 16 11/17/2018 0835   ALKPHOS 42 11/17/2018 0835   BILITOT 0.5 11/17/2018 0835   GFRNONAA >60 11/19/2018 0541   GFRNONAA >89 04/01/2015 1608   GFRAA >60 11/19/2018 0541   GFRAA >89 04/01/2015 1608    Gastric biopsies pending   @ASSESSMENTPLANBEGIN @ Assessment:  No ongoing bleeding, hemoglobin is equilibrating.  Melena Acute blood loss anemia NSAID induced gastric ulcers, clean-based requiring  no endoscopic intervention on upper endoscopy 11/17/2018    Plan: Twice daily oral PPI for 8 weeks Dr. Rhea Belton will arrange follow-up after biopsy results. Please give her a dose of IV iron prior to discharge.  She can go home today from a GI perspective.   Charlie Pitter III Office: [phone]

## 2018-11-19 NOTE — Discharge Summary (Signed)
Physician Discharge Summary  Tamara LaurenceHattie Mae BataviaEverette ZOX:[id number] DOB: [birth year] DOA: 11/17/2018  PCP: Renaye RakersBland, Veita, MD  Admit date: 11/17/2018 Discharge date: 11/19/2018  Admitted From: home Discharge disposition: home   Recommendations for Outpatient Follow-Up:   Twice daily oral PPI for 8 weeks Dr. Rhea BeltonPyrtle will arrange follow-up after biopsy results BMP 1 week re: K-- stopped HCTZ   Discharge Diagnosis:   Principal Problem:   Acute GI bleeding Active Problems:   GERD (gastroesophageal reflux disease)   Hypertension   Acute blood loss anemia   Acute gastric ulcer with hemorrhage   Duodenal ulcer   GI bleed    Discharge Condition: Improved.  Diet recommendation: Low sodium, heart healthy  Wound care: None.  Code status: Full.   History of Present Illness:   Tamara Russell is a 58 y.o. female with medical history significant of HTN and GERD presenting with BRBPR.  She has had blood in her stool and has been vomiting.  She was in the ER last night for the same issue and was discharged.  She had another episode last night and again this AM and came back.  There was blood in the emesis.  Also with bright red blood in the stools.  Unsure if there is stool mixed in with it.  Mild abdominal pain in the middle.  Medication given in the ER eased that off.  She has been taking NSAIDs.  No fever.   Hospital Course by Problem:   Hematemesis, melena in setting of NSAID use.  -s/p WJX:[id number]:Nonbleeding gastric ulcers, duodenal ulcer, duodenitis. Nonobstructing Schatzki's ring at [employer] junction. -biopsy pending -GI consult appreciated:Twice daily PPI for at least 8 weeks. If she ever resumes NSAIDs she should accompany these with once daily PPI  ABLA -s/p 2 units PRBC today -recheck H/H pending -INR was elevated at admission-- no LFTs abnormal, ? From NSAIDS  GERD -will need PPO BID x 8 weeks and then daily after if taking NSAIDS  HTN -Continue home meds - CCB,  ACE -d/c HCTZ due to hypokalemia  Obesity Estimated body mass index is 42.75 kg/m as calculated from the following:   Height as of this encounter: 5' 1.5" (1.562 m).   Weight as of 11/16/18: 104.3 kg.    Medical Consultants:   GI   Discharge Exam:   Vitals:   11/19/18 0540 11/19/18 0857  BP: (!) 102/56 131/77  Pulse: 81   Resp: 18   Temp: 97.6 F (36.4 C)   SpO2: 93%    Vitals:   11/19/18 0011 11/19/18 0200 11/19/18 0540 11/19/18 0857  BP: (!) 97/59 134/80 (!) 102/56 131/77  Pulse: 81  81   Resp: 20  18   Temp: 97.9 F (36.6 C)  97.6 F (36.4 C)   TempSrc: Oral  Oral   SpO2: 94%  93%   Weight:      Height:        General exam: Appears calm and comfortable.   The results of significant diagnostics from this hospitalization (including imaging, microbiology, ancillary and laboratory) are listed below for reference.     Procedures and Diagnostic Studies:   No results found.   Labs:   Basic Metabolic Panel: Recent Labs  Lab 11/16/18 1344 11/17/18 0835 11/18/18 0435 11/19/18 0541  NA 139 140 142 141  K 3.3* 3.4* 3.0* 3.1*  CL 97* 100 102 103  CO2 31 31 30  32  GLUCOSE 124* 116* 97 95  BUN 18 25* 16  11  CREATININE 0.70 0.74 0.63 0.69  CALCIUM 8.7* 8.5* 7.9* 8.2*  MG  --   --   --  2.0   GFR Estimated Creatinine Clearance: 84.3 mL/min (by C-G formula based on SCr of 0.69 mg/dL). Liver Function Tests: Recent Labs  Lab 11/16/18 1344 11/17/18 0835  AST 27 19  ALT 19 16  ALKPHOS 46 42  BILITOT 0.6 0.5  PROT 5.6* 5.3*  ALBUMIN 2.8* 2.7*   Recent Labs  Lab 11/16/18 1344  LIPASE 19   No results for input(s): AMMONIA in the last 168 hours. Coagulation profile Recent Labs  Lab 11/17/18 0835 11/18/18 1553  INR 1.65 1.10    CBC: Recent Labs  Lab 11/16/18 1344 11/17/18 0835 11/17/18 1324 11/18/18 0435 11/18/18 1553 11/19/18 0541  WBC 4.8 8.3 7.6 7.3  --  8.3  NEUTROABS 3.5 6.5  --   --   --   --   HGB 11.2* 8.9* 8.4* 6.8* 9.6*  8.8*  HCT 37.2 29.1* 26.5* 22.7* 30.3* 28.5*  MCV 78.5* 78.6* 77.5* 78.3*  --  81.7  PLT 282 287 256 227  --  215   Cardiac Enzymes: No results for input(s): CKTOTAL, CKMB, CKMBINDEX, TROPONINI in the last 168 hours. BNP: Invalid input(s): POCBNP CBG: No results for input(s): GLUCAP in the last 168 hours. D-Dimer No results for input(s): DDIMER in the last 72 hours. Hgb A1c No results for input(s): HGBA1C in the last 72 hours. Lipid Profile No results for input(s): CHOL, HDL, LDLCALC, TRIG, CHOLHDL, LDLDIRECT in the last 72 hours. Thyroid function studies No results for input(s): TSH, T4TOTAL, T3FREE, THYROIDAB in the last 72 hours.  Invalid input(s): FREET3 Anemia work up Recent Labs    11/17/18 1324  VITAMINB12 728  FOLATE 33.3  FERRITIN 29  TIBC 277  IRON 58  RETICCTPCT 2.3   Microbiology No results found for this or any previous visit (from the past 240 hour(s)).   Discharge Instructions:   Discharge Instructions    Diet - low sodium heart healthy   Complete by:  As directed    Discharge instructions   Complete by:  As directed    Twice daily oral protonix for 8 weeks Dr. Rhea BeltonPyrtle will arrange follow-up after biopsy results   Increase activity slowly   Complete by:  As directed      Allergies as of 11/19/2018      Reactions   Penicillins Other (See Comments)   Childhood allergy      Medication List    STOP taking these medications   losartan-hydrochlorothiazide 100-25 MG tablet Commonly known as:  HYZAAR   ZORVOLEX 35 MG Caps Generic drug:  Diclofenac     TAKE these medications   acetaminophen 500 MG tablet Commonly known as:  TYLENOL Take 1,000 mg by mouth every 6 (six) hours as needed for moderate pain. Reported on 04/21/2016   amLODipine 10 MG tablet Commonly known as:  NORVASC Take 1 tablet (10 mg total) by mouth daily.   CENTRUM SILVER ADULT 50+ PO Take 1 tablet by mouth daily.   losartan 100 MG tablet Commonly known as:  COZAAR Take 1  tablet (100 mg total) by mouth daily.   pantoprazole 40 MG tablet Commonly known as:  PROTONIX Take 1 tablet (40 mg total) by mouth 2 (two) times daily before a meal.      Follow-up Information    Renaye RakersBland, Veita, MD Follow up in 1 week(s).   Specialty:  Family Medicine Contact  information: [address] [place] [address] [phone]            Time coordinating discharge: 25 min  Signed:  Joseph Art DO  [facility] 11/19/2018, 9:30 AM

## 2018-11-20 ENCOUNTER — Other Ambulatory Visit: Payer: Self-pay

## 2018-11-20 ENCOUNTER — Telehealth: Payer: Self-pay | Admitting: Internal Medicine

## 2018-11-20 DIAGNOSIS — A048 Other specified bacterial intestinal infections: Secondary | ICD-10-CM

## 2018-11-20 MED ORDER — BIS SUBCIT-METRONID-TETRACYC 140-125-125 MG PO CAPS: 3.0000 | ORAL_CAPSULE | Freq: Three times a day (TID) | ORAL | 0 refills | Status: DC

## 2018-11-20 MED ORDER — DOXYCYCLINE HYCLATE 100 MG PO TABS: 100.0000 mg | ORAL_TABLET | Freq: Two times a day (BID) | ORAL | 0 refills | Status: AC

## 2018-11-20 MED ORDER — BISMUTH SUBSALICYLATE 262 MG PO CHEW: 524.0000 mg | CHEWABLE_TABLET | Freq: Four times a day (QID) | ORAL | 0 refills | Status: AC

## 2018-11-20 MED ORDER — METRONIDAZOLE 250 MG PO TABS: 250.0000 mg | ORAL_TABLET | Freq: Two times a day (BID) | ORAL | 0 refills | Status: AC

## 2018-11-20 MED FILL — DOXYCYCLINE HYCLATE 100 MG: 100 | 14 days supply | Qty: 28 | Fill #0

## 2018-11-20 MED FILL — metroNIDAZOLE 250 MG TABS: 250 | 14 days supply | Qty: 28 | Fill #0

## 2018-11-20 NOTE — Telephone Encounter (Signed)
Dr Rhea Belton has given verbal order for patient to have doxycycline 100 mg bid, metronidazole 250 mg bid and pepto bismol 524 mg 4 times daily x 14 days in place of [facility]. I have contacted patient's pharmacy to advise of this as well. I have sent rx's to pharmacy in place of [facility].   Patient has been advised of this information.

## 2018-11-20 NOTE — Telephone Encounter (Signed)
If wanting to keep this pt on this prescription the medication will have to be split up because the medication is to expensive to order when the pt has not insurance. [name] will like a call first before new prescription is called

## 2018-11-20 NOTE — Telephone Encounter (Signed)
Pt requested that [facility].  Pharmacy denied a prescription - pt is unsure what it is.  Pt would also like an update.

## 2018-12-12 MED FILL — AMLODIPINE BESYLATE 10 MG T: 10 | 30 days supply | Qty: 30 | Fill #2

## 2018-12-17 MED FILL — PANTOPRAZOLE SOD DR 40 MG T: 40 | 30 days supply | Qty: 60 | Fill #1

## 2018-12-20 MED FILL — LOSARTAN-HCTZ 100-25 MG TAB: 100-25 | 30 days supply | Qty: 30 | Fill #0

## 2019-12-03 ENCOUNTER — Other Ambulatory Visit: Payer: Self-pay

## 2019-12-03 ENCOUNTER — Emergency Department (HOSPITAL_COMMUNITY)
Admission: EM | Admit: 2019-12-03 | Discharge: 2019-12-04 | Disposition: A | Discharge status: Home / Self Care | Payer: 59 | Attending: Emergency Medicine | Admitting: Emergency Medicine

## 2019-12-03 ENCOUNTER — Encounter (HOSPITAL_COMMUNITY): Payer: Self-pay | Admitting: Emergency Medicine

## 2019-12-03 ENCOUNTER — Emergency Department (HOSPITAL_COMMUNITY): Payer: 59

## 2019-12-03 DIAGNOSIS — R0602 Shortness of breath: Secondary | ICD-10-CM | POA: Insufficient documentation

## 2019-12-03 DIAGNOSIS — Z531 Procedure and treatment not carried out because of patient's decision for reasons of belief and group pressure: Secondary | ICD-10-CM | POA: Diagnosis not present

## 2019-12-03 DIAGNOSIS — Z79899 Other long term (current) drug therapy: Secondary | ICD-10-CM | POA: Diagnosis not present

## 2019-12-03 DIAGNOSIS — R030 Elevated blood-pressure reading, without diagnosis of hypertension: Secondary | ICD-10-CM | POA: Diagnosis present

## 2019-12-03 LAB — CBC WITH DIFFERENTIAL/PLATELET
Abs Immature Granulocytes: 0.05 10*3/uL (ref 0.00–0.07)
Basophils Absolute: 0 10*3/uL (ref 0.0–0.1)
Basophils Relative: 1 %
Eosinophils Absolute: 0.1 10*3/uL (ref 0.0–0.5)
Eosinophils Relative: 2 %
HCT: 47.1 % — ABNORMAL HIGH (ref 36.0–46.0)
Hemoglobin: 14.1 g/dL (ref 12.0–15.0)
Immature Granulocytes: 1 %
Lymphocytes Relative: 29 %
Lymphs Abs: 1.6 10*3/uL (ref 0.7–4.0)
MCH: 23.6 pg — ABNORMAL LOW (ref 26.0–34.0)
MCHC: 29.9 g/dL — ABNORMAL LOW (ref 30.0–36.0)
MCV: 78.8 fL — ABNORMAL LOW (ref 80.0–100.0)
Monocytes Absolute: 0.5 10*3/uL (ref 0.1–1.0)
Monocytes Relative: 8 %
Neutro Abs: 3.3 10*3/uL (ref 1.7–7.7)
Neutrophils Relative %: 59 %
Platelets: 279 10*3/uL (ref 150–400)
RBC: 5.98 MIL/uL — ABNORMAL HIGH (ref 3.87–5.11)
RDW: 17 % — ABNORMAL HIGH (ref 11.5–15.5)
WBC: 5.5 10*3/uL (ref 4.0–10.5)
nRBC: 0 % (ref 0.0–0.2)

## 2019-12-03 LAB — COMPREHENSIVE METABOLIC PANEL
ALT: 16 U/L (ref 0–44)
AST: 22 U/L (ref 15–41)
Albumin: 3.4 g/dL — ABNORMAL LOW (ref 3.5–5.0)
Alkaline Phosphatase: 74 U/L (ref 38–126)
Anion gap: 12 (ref 5–15)
BUN: 7 mg/dL — ABNORMAL LOW (ref 8–23)
CO2: 35 mmol/L — ABNORMAL HIGH (ref 22–32)
Calcium: 9.4 mg/dL (ref 8.9–10.3)
Chloride: 87 mmol/L — ABNORMAL LOW (ref 98–111)
Creatinine, Ser: 0.59 mg/dL (ref 0.44–1.00)
GFR calc Af Amer: >60 mL/min (ref >60)
GFR calc non Af Amer: >60 mL/min (ref >60)
Glucose, Bld: 109 mg/dL — ABNORMAL HIGH (ref 70–99)
Potassium: 3.5 mmol/L (ref 3.5–5.1)
Sodium: 134 mmol/L — ABNORMAL LOW (ref 135–145)
Total Bilirubin: 0.6 mg/dL (ref 0.3–1.2)
Total Protein: 6.7 g/dL (ref 6.5–8.1)

## 2019-12-03 LAB — URINALYSIS, ROUTINE W REFLEX MICROSCOPIC
Bilirubin Urine: NEGATIVE
Glucose, UA: NEGATIVE mg/dL
Hgb urine dipstick: NEGATIVE
Ketones, ur: NEGATIVE mg/dL
Leukocytes,Ua: NEGATIVE
Nitrite: NEGATIVE
Protein, ur: NEGATIVE mg/dL
Specific Gravity, Urine: 1.009 (ref 1.005–1.030)
pH: 7 (ref 5.0–8.0)

## 2019-12-03 NOTE — ED Notes (Signed)
Pt decided to leave and go to her Dr in the AM

## 2019-12-03 NOTE — ED Triage Notes (Signed)
Patient reports elevated blood pressure at home this afternoon BP=220/140 , she has not taken her antihypertensive medication this evening , mild SOB , no cough or fever .

## 2020-01-01 ENCOUNTER — Encounter: Payer: Self-pay | Admitting: *Deleted

## 2020-01-01 ENCOUNTER — Other Ambulatory Visit: Payer: Self-pay | Admitting: *Deleted

## 2020-01-01 DIAGNOSIS — R002 Palpitations: Secondary | ICD-10-CM

## 2020-01-01 DIAGNOSIS — I10 Essential (primary) hypertension: Secondary | ICD-10-CM

## 2020-01-01 NOTE — Progress Notes (Signed)
Patient ID: Tamara Russell, female   DOB: 01-29-1961, 59 y.o.   MRN: [id number] Patient enrolled for [employer] to ship a 30 day cardiac event monitor to her home.  Instructions sent to patient via My Chart message and will also be included in monitor kit.

## 2020-01-07 ENCOUNTER — Ambulatory Visit (INDEPENDENT_AMBULATORY_CARE_PROVIDER_SITE_OTHER): Payer: 59

## 2020-01-07 DIAGNOSIS — I10 Essential (primary) hypertension: Secondary | ICD-10-CM

## 2020-01-07 DIAGNOSIS — R002 Palpitations: Secondary | ICD-10-CM | POA: Diagnosis not present

## 2020-03-05 ENCOUNTER — Ambulatory Visit: Payer: 59 | Admitting: Internal Medicine

## 2020-03-05 DIAGNOSIS — Z0289 Encounter for other administrative examinations: Secondary | ICD-10-CM

## 2021-10-24 ENCOUNTER — Ambulatory Visit: Payer: Self-pay | Admitting: Cardiology

## 2021-11-21 ENCOUNTER — Ambulatory Visit: Payer: 59 | Admitting: Cardiology

## 2021-11-21 ENCOUNTER — Other Ambulatory Visit: Payer: Self-pay

## 2021-11-21 ENCOUNTER — Encounter: Payer: Self-pay | Admitting: Cardiology

## 2021-11-21 VITALS — BP 141/89 | HR 90 | Ht 61.0 in | Wt 231.0 lb

## 2021-11-21 DIAGNOSIS — R0609 Other forms of dyspnea: Secondary | ICD-10-CM

## 2021-11-21 DIAGNOSIS — I10 Essential (primary) hypertension: Secondary | ICD-10-CM

## 2021-11-21 DIAGNOSIS — Z6841 Body Mass Index (BMI) 40.0 and over, adult: Secondary | ICD-10-CM | POA: Diagnosis not present

## 2021-11-21 DIAGNOSIS — Z01810 Encounter for preprocedural cardiovascular examination: Secondary | ICD-10-CM

## 2021-11-21 NOTE — Progress Notes (Signed)
Date:  11/21/2021   ID:  [place]se [place], DOB Sep 15, 1961, MRN [id number]  PCP:  Lucianne Lei, MD  Cardiologist:  Rex Kras, DO, [facility] (established care 11/21/21)  REASON FOR CONSULT: Preprocedural risk stratification  REQUESTING PHYSICIAN:  Lucianne Lei, MD [place],  [place] [address]  Chief Complaint  Patient presents with   New Patient (Initial Visit)   Medical Clearance   Shortness of Breath    HPI  Tamara Russell is a 61 y.o. African-American female who presents to the office with a chief complaint of " shortness of breath and procedure clearance." Patient's past medical history and cardiovascular risk factors include: Benign essential hypertension, history of COVID-19 infection (2020), former smoker, Obesity due to excess calories, post menopausal female.  She is referred to the office at the request of Lucianne Lei, MD for evaluation of preprocedural risk stratification.  Patient states that she followed up with her gastroenterologist for a routine colon cancer screening colonoscopy but is currently postponed until preprocedural risk stratification is provided by cardiology.  Patient states that she has been experiencing shortness of breath predominantly with effort related activities.  She denies orthopnea, paroxysmal nocturnal dyspnea or lower extremity swelling.  Shortness of breath can also be present at rest.  She is attributing her shortness of breath to progressive weight gain year after year cording to the patient.  Patient is not physically active to comment on exertional chest pain but denies pain at rest.  Patient has not been screened for sleep apnea given her comorbid conditions include morbid obesity with a BMI of 43.  Visually patient looks exhausted, tired, fatigue.  Patient states that she is usually not getting a good night rest, is up going to the bathroom every 2 hours.  She takes all her of her antihypertensive medications at night  including chlorthalidone and hydrochlorothiazide.  Given her dyspnea patient denies any prior history of DVT or PE.  No recent surgeries or prolonged immobilization.  She denies diabetes mellitus or history of CVA/TIA.  FUNCTIONAL STATUS: No structured exercise program or daily routine.   ALLERGIES: Allergies  Allergen Reactions   Penicillins Other (See Comments)    Childhood allergy    MEDICATION LIST PRIOR TO VISIT: Current Meds  Medication Sig   acetaminophen (TYLENOL) 500 MG tablet Take 1,000 mg by mouth every 6 (six) hours as needed for moderate pain. Reported on 04/21/2016   amLODipine (NORVASC) 10 MG tablet Take 1 tablet (10 mg total) by mouth daily.   Azilsartan-Chlorthalidone (EDARBYCLOR) 40-25 MG TABS Take 1 tablet by mouth daily.   hydrochlorothiazide (HYDRODIURIL) 25 MG tablet Take 25 mg by mouth daily.   Multiple Vitamins-Minerals (CENTRUM SILVER ADULT 50+ PO) Take 1 tablet by mouth daily.     PAST MEDICAL HISTORY: Past Medical History:  Diagnosis Date   Acid reflux    Complication of anesthesia    slow to wake up   History of COVID-19    Hypertension     PAST SURGICAL HISTORY: Past Surgical History:  Procedure Laterality Date   ABDOMINAL HYSTERECTOMY     BIOPSY  11/17/2018   Procedure: BIOPSY;  Surgeon: Jerene Bears, MD;  Location: [facility];  Service: Gastroenterology;;   BREAST EXCISIONAL BIOPSY Left    benign   BREAST EXCISIONAL BIOPSY Right    benign   ESOPHAGOGASTRODUODENOSCOPY (EGD) WITH PROPOFOL N/A 11/17/2018   Procedure: ESOPHAGOGASTRODUODENOSCOPY (EGD) WITH PROPOFOL;  Surgeon: Jerene Bears, MD;  Location: [facility];  Service: Gastroenterology;  Laterality: N/A;    FAMILY HISTORY: The patient family history includes Cancer (age of onset: 54) in her father; Colon cancer in her maternal grandfather and paternal grandmother; Diabetes in her maternal grandmother and paternal grandmother; Hypertension in her father.  SOCIAL HISTORY:  The  patient  reports that she quit smoking about 26 years ago. Her smoking use included cigarettes. She started smoking about 46 years ago. She has a 10.00 pack-year smoking history. She has never used smokeless tobacco. She reports that she does not drink alcohol and does not use drugs.  REVIEW OF SYSTEMS: Review of Systems  Constitutional: Positive for malaise/fatigue and weight gain.  Cardiovascular:  Positive for dyspnea on exertion. Negative for chest pain, leg swelling, palpitations and syncope.  Respiratory:  Positive for shortness of breath.    PHYSICAL EXAM: Vitals with BMI 11/21/2021 12/03/2019 12/03/2019  Height 5\' 1"  - -  Weight 231 lbs - -  BMI 43.67 - -  Systolic 141 170 993  Diastolic 89 95 97  Pulse 90 88 87    CONSTITUTIONAL: Appears older than stated age, hemodynamically stable, no acute distress.  SKIN: Skin is warm and dry. No rash noted. No cyanosis. No pallor. No jaundice HEAD: Normocephalic and atraumatic.  EYES: No scleral icterus MOUTH/THROAT: Moist oral membranes.  NECK: Unable to evaluate JVP due to adipose tissue and neck stature. No thyromegaly noted. No carotid bruits  LYMPHATIC: No visible cervical adenopathy.  CHEST Normal respiratory effort. No intercostal retractions  LUNGS: Clear to auscultation bilaterally.  No stridor. No wheezes. No rales.  CARDIOVASCULAR: Tachycardia, Regular, positive S1-S2, no murmurs rubs or gallops appreciated. ABDOMINAL: Obese, soft, nontender, nondistended, positive bowel sounds all 4 quadrants. No apparent ascites.  EXTREMITIES: No peripheral edema, warm to touch, +2 bilateral DP and PT pulses HEMATOLOGIC: No significant bruising NEUROLOGIC: Oriented to person, place, and time. Nonfocal. Normal muscle tone.  PSYCHIATRIC: Normal mood and affect. Normal behavior. Cooperative  CARDIAC DATABASE: EKG: 11/21/2021: NSR, 86 bpm, without underlying injury pattern.  Echocardiogram: No results found for this or any previous visit from  the past 1095 days.    Stress Testing: No results found for this or any previous visit from the past 1095 days.   Heart Catheterization: None  Cardiac event monitor 02/05/2020 Sinus rhythm Occasional premature ventricular contractions No AV block or pauses No sustained arrhythmias Baseline artifact limits interpretation at times  LABORATORY DATA: CBC Latest Ref Rng & Units 12/03/2019 11/19/2018 11/18/2018  WBC 4.0 - 10.5 K/uL 5.5 8.3 -  Hemoglobin 12.0 - 15.0 g/dL 57.0 1.7(B) 9.3(J)  Hematocrit 36.0 - 46.0 % 47.1(H) 28.5(L) 30.3(L)  Platelets 150 - 400 K/uL 279 215 -    CMP Latest Ref Rng & Units 12/03/2019 11/19/2018 11/18/2018  Glucose 70 - 99 mg/dL 030(S) 95 97  BUN 8 - 23 mg/dL 7(L) 11 16  Creatinine 0.44 - 1.00 mg/dL 9.23 3.00 7.62  Sodium 135 - 145 mmol/L 134(L) 141 142  Potassium 3.5 - 5.1 mmol/L 3.5 3.1(L) 3.0(L)  Chloride 98 - 111 mmol/L 87(L) 103 102  CO2 22 - 32 mmol/L 35(H) 32 30  Calcium 8.9 - 10.3 mg/dL 9.4 2.6(J) 7.9(L)  Total Protein 6.5 - 8.1 g/dL 6.7 - -  Total Bilirubin 0.3 - 1.2 mg/dL 0.6 - -  Alkaline Phos 38 - 126 U/L 74 - -  AST 15 - 41 U/L 22 - -  ALT 0 - 44 U/L 16 - -    Lipid Panel  Component Value Date/Time   CHOL 164 10/13/2016 0915   TRIG 59 10/13/2016 0915   HDL 65 10/13/2016 0915   CHOLHDL 2.5 10/13/2016 0915   VLDL 12 10/13/2016 0915   LDLCALC 87 10/13/2016 0915    No components found for: NTPROBNP No results for input(s): PROBNP in the last 8760 hours. No results for input(s): TSH in the last 8760 hours.  BMP No results for input(s): NA, K, CL, CO2, GLUCOSE, BUN, CREATININE, CALCIUM, GFRNONAA, GFRAA in the last 8760 hours.  HEMOGLOBIN A1C Lab Results  Component Value Date   HGBA1C 5.6 04/21/2016   MPG 114 04/21/2016    IMPRESSION:    ICD-10-CM   1. Pre-procedural cardiovascular examination  Z01.810 EKG 12-Lead    PCV ECHOCARDIOGRAM COMPLETE    Pro b natriuretic peptide (BNP)    Basic metabolic panel    PCV CARDIAC  STRESS TEST    D-dimer, quantitative    2. Dyspnea on exertion  R06.09 Pro b natriuretic peptide (BNP)    D-dimer, quantitative    3. Hypertension, essential  I10     4. Class 3 severe obesity due to excess calories without serious comorbidity with body mass index (BMI) of 40.0 to 44.9 in adult [facility])  E66.01    Z68.41        RECOMMENDATIONS: Johara Perches is a 61 y.o. African-American female whose past medical history and cardiac risk factors include: Benign essential hypertension, history of COVID-19 infection (2020), former smoker, Obesity due to excess calories, post menopausal female.  Patient was recently being scheduled for a routine screening colonoscopy but has been asked to see cardiology prior to her elective procedure given her shortness of breath.  Patient's shortness of breath is noticeable at rest during today's office visit and more pronounced with effort related activities.  Physically patient is not very active and unable to comment on exertional chest pain but at rest she denies discomfort.  No prior history of DVT/pulmonary embolism.  Denies any prolonged immobilization or recent surgeries.  However, she is tachycardic on physical examination and short of breath during today's office visit.  She is attributing her shortness of breath at rest due to wearing a mask during today's encounter.  Would recommend checking a D-dimer and BNP.  Further recommendations to follow.  Given her risk factors, symptoms, and upcoming elective procedure would recommend exercise treadmill stress test to evaluate for exercise-induced ischemia.  EKG is interpretable and patient is willing to exercise.  Her blood pressure is currently being managed by PCP.  Not sure why she is on 2 thiazide diuretics and I have asked her to discuss this further with PCP (she is seeing her PCP tomorrow).  Currently she takes all three BP meds at night. As a result, she is up all night using the restroom.  For  now she is recommended to take amlodipine at night and to take Edarbyclor and HCTZ in the morning.  I have asked her to be evaluated for sleep apnea as well, she will discuss it further with PCP tomorrow .  Check BMP to evaluate for electrolyte abnormalities.  Further recommendations to follow.  Today's encounter involved discussion of at least 2 chronic comorbid conditions, I have ordered 3 unique diagnostic testing, independently reviewed and interpreted today's EKG, discussed current medical therapy, questions and concerns addressed to her satisfaction, and coordination of care.  FINAL MEDICATION LIST END OF ENCOUNTER: No orders of the defined types were placed in this encounter.   Medications  Discontinued During This Encounter  Medication Reason   losartan (COZAAR) 100 MG tablet    bismuth-metronidazole-tetracycline (PYLERA) 140-125-125 MG capsule    pantoprazole (PROTONIX) 40 MG tablet      Current Outpatient Medications:    acetaminophen (TYLENOL) 500 MG tablet, Take 1,000 mg by mouth every 6 (six) hours as needed for moderate pain. Reported on 04/21/2016, Disp: , Rfl:    amLODipine (NORVASC) 10 MG tablet, Take 1 tablet (10 mg total) by mouth daily., Disp: 90 tablet, Rfl: 1   Azilsartan-Chlorthalidone (EDARBYCLOR) 40-25 MG TABS, Take 1 tablet by mouth daily., Disp: , Rfl:    hydrochlorothiazide (HYDRODIURIL) 25 MG tablet, Take 25 mg by mouth daily., Disp: , Rfl:    Multiple Vitamins-Minerals (CENTRUM SILVER ADULT 50+ PO), Take 1 tablet by mouth daily., Disp: , Rfl:   Orders Placed This Encounter  Procedures   Pro b natriuretic peptide (BNP)   Basic metabolic panel   D-dimer, quantitative   PCV CARDIAC STRESS TEST   EKG 12-Lead   PCV ECHOCARDIOGRAM COMPLETE    There are no Patient Instructions on file for this visit.   --Continue cardiac medications as reconciled in final medication list. --Return in about 3 weeks (around 12/12/2021) for Follow up, Dyspnea, Review test results.  Or sooner if needed. --Continue follow-up with your primary care physician regarding the management of your other chronic comorbid conditions.  Patient's questions and concerns were addressed to her satisfaction. She voices understanding of the instructions provided during this encounter.   This note was created using a voice recognition software as a result there may be grammatical errors inadvertently enclosed that do not reflect the nature of this encounter. Every attempt is made to correct such errors.  Rex Kras, [place], [place]  Pager: [phone] Office: [phone]

## 2021-11-22 DIAGNOSIS — E7800 Pure hypercholesterolemia, unspecified: Secondary | ICD-10-CM | POA: Diagnosis not present

## 2021-11-22 DIAGNOSIS — I10 Essential (primary) hypertension: Secondary | ICD-10-CM | POA: Diagnosis not present

## 2021-11-22 DIAGNOSIS — E785 Hyperlipidemia, unspecified: Secondary | ICD-10-CM | POA: Diagnosis not present

## 2021-11-22 DIAGNOSIS — R7303 Prediabetes: Secondary | ICD-10-CM | POA: Diagnosis not present

## 2021-11-22 DIAGNOSIS — E1169 Type 2 diabetes mellitus with other specified complication: Secondary | ICD-10-CM | POA: Diagnosis not present

## 2021-11-22 DIAGNOSIS — E6609 Other obesity due to excess calories: Secondary | ICD-10-CM | POA: Diagnosis not present

## 2021-11-30 ENCOUNTER — Telehealth: Payer: Self-pay

## 2021-12-07 ENCOUNTER — Ambulatory Visit: Payer: 59

## 2021-12-07 ENCOUNTER — Other Ambulatory Visit: Payer: Self-pay

## 2021-12-07 DIAGNOSIS — Z01810 Encounter for preprocedural cardiovascular examination: Secondary | ICD-10-CM

## 2021-12-07 NOTE — Progress Notes (Signed)
External Labs: Collected: 11/22/2021 Total cholesterol 167, triglycerides 41, HDL 71, LDL 82, non-HDL 94. BUN 9, creatinine 0.57. eGFR >60. Sodium 138, potassium 3.7, chloride 90, bicarb 39, AST 19, ALT 13, alkaline phosphatase 65 A1c 5.8 BNP 14. Hemoglobin 13.7 g/dL, hematocrit 43.7% D-dimer 0.21

## 2021-12-16 ENCOUNTER — Ambulatory Visit: Payer: 59 | Admitting: Cardiology

## 2021-12-19 ENCOUNTER — Ambulatory Visit: Payer: 59 | Admitting: Cardiology

## 2021-12-25 ENCOUNTER — Other Ambulatory Visit: Payer: Self-pay | Admitting: Cardiology

## 2021-12-25 DIAGNOSIS — R9439 Abnormal result of other cardiovascular function study: Secondary | ICD-10-CM

## 2021-12-25 DIAGNOSIS — R0609 Other forms of dyspnea: Secondary | ICD-10-CM

## 2021-12-25 DIAGNOSIS — Z01810 Encounter for preprocedural cardiovascular examination: Secondary | ICD-10-CM

## 2022-01-17 ENCOUNTER — Ambulatory Visit: Payer: 59

## 2022-01-17 DIAGNOSIS — Z01810 Encounter for preprocedural cardiovascular examination: Secondary | ICD-10-CM | POA: Diagnosis not present

## 2022-01-17 DIAGNOSIS — R9439 Abnormal result of other cardiovascular function study: Secondary | ICD-10-CM | POA: Diagnosis not present

## 2022-01-17 DIAGNOSIS — R0609 Other forms of dyspnea: Secondary | ICD-10-CM

## 2022-01-18 ENCOUNTER — Other Ambulatory Visit: Payer: 59

## 2022-01-19 ENCOUNTER — Encounter: Payer: Self-pay | Admitting: Cardiology

## 2022-01-19 NOTE — Progress Notes (Signed)
Called pt to inform her about her stress test pt ubderstood

## 2022-02-01 ENCOUNTER — Inpatient Hospital Stay (HOSPITAL_COMMUNITY)
Admission: EM | Admit: 2022-02-01 | Discharge: 2022-02-05 | DRG: 689 | Disposition: A | Discharge status: Home Health | Payer: 59 | Attending: Internal Medicine | Admitting: Internal Medicine

## 2022-02-01 ENCOUNTER — Encounter (HOSPITAL_COMMUNITY): Payer: Self-pay | Admitting: Emergency Medicine

## 2022-02-01 ENCOUNTER — Other Ambulatory Visit: Payer: Self-pay

## 2022-02-01 DIAGNOSIS — Z87891 Personal history of nicotine dependence: Secondary | ICD-10-CM

## 2022-02-01 DIAGNOSIS — R531 Weakness: Secondary | ICD-10-CM

## 2022-02-01 DIAGNOSIS — N390 Urinary tract infection, site not specified: Secondary | ICD-10-CM | POA: Diagnosis not present

## 2022-02-01 DIAGNOSIS — Z880 Allergy status to penicillin: Secondary | ICD-10-CM

## 2022-02-01 DIAGNOSIS — Z800 Family history of malignant neoplasm of digestive organs: Secondary | ICD-10-CM

## 2022-02-01 DIAGNOSIS — B9620 Unspecified Escherichia coli [E. coli] as the cause of diseases classified elsewhere: Secondary | ICD-10-CM | POA: Diagnosis present

## 2022-02-01 DIAGNOSIS — Z8249 Family history of ischemic heart disease and other diseases of the circulatory system: Secondary | ICD-10-CM

## 2022-02-01 DIAGNOSIS — Z8616 Personal history of COVID-19: Secondary | ICD-10-CM

## 2022-02-01 DIAGNOSIS — K219 Gastro-esophageal reflux disease without esophagitis: Secondary | ICD-10-CM | POA: Diagnosis present

## 2022-02-01 DIAGNOSIS — Z833 Family history of diabetes mellitus: Secondary | ICD-10-CM

## 2022-02-01 DIAGNOSIS — E876 Hypokalemia: Secondary | ICD-10-CM | POA: Diagnosis present

## 2022-02-01 DIAGNOSIS — R4182 Altered mental status, unspecified: Secondary | ICD-10-CM | POA: Diagnosis present

## 2022-02-01 DIAGNOSIS — E871 Hypo-osmolality and hyponatremia: Secondary | ICD-10-CM | POA: Diagnosis present

## 2022-02-01 DIAGNOSIS — I5032 Chronic diastolic (congestive) heart failure: Secondary | ICD-10-CM | POA: Diagnosis present

## 2022-02-01 DIAGNOSIS — I110 Hypertensive heart disease with heart failure: Secondary | ICD-10-CM | POA: Diagnosis present

## 2022-02-01 DIAGNOSIS — G9341 Metabolic encephalopathy: Secondary | ICD-10-CM | POA: Diagnosis present

## 2022-02-01 DIAGNOSIS — R778 Other specified abnormalities of plasma proteins: Secondary | ICD-10-CM | POA: Diagnosis present

## 2022-02-01 DIAGNOSIS — T502X5A Adverse effect of carbonic-anhydrase inhibitors, benzothiadiazides and other diuretics, initial encounter: Secondary | ICD-10-CM | POA: Diagnosis present

## 2022-02-01 DIAGNOSIS — Z87440 Personal history of urinary (tract) infections: Secondary | ICD-10-CM

## 2022-02-01 DIAGNOSIS — R0902 Hypoxemia: Secondary | ICD-10-CM | POA: Diagnosis present

## 2022-02-01 DIAGNOSIS — R000 Tachycardia, unspecified: Secondary | ICD-10-CM

## 2022-02-01 DIAGNOSIS — Z6841 Body Mass Index (BMI) 40.0 and over, adult: Secondary | ICD-10-CM

## 2022-02-01 DIAGNOSIS — Z90710 Acquired absence of both cervix and uterus: Secondary | ICD-10-CM

## 2022-02-01 DIAGNOSIS — I455 Other specified heart block: Secondary | ICD-10-CM

## 2022-02-01 DIAGNOSIS — R001 Bradycardia, unspecified: Secondary | ICD-10-CM | POA: Diagnosis not present

## 2022-02-01 DIAGNOSIS — Y92009 Unspecified place in unspecified non-institutional (private) residence as the place of occurrence of the external cause: Secondary | ICD-10-CM

## 2022-02-01 DIAGNOSIS — E861 Hypovolemia: Secondary | ICD-10-CM | POA: Diagnosis present

## 2022-02-01 DIAGNOSIS — Z79899 Other long term (current) drug therapy: Secondary | ICD-10-CM

## 2022-02-01 NOTE — ED Triage Notes (Signed)
Pt c/o shortness of breath, swelling to BLE and chest fluttering. SpO2 88% on room air, 2LNC applied with improvement. ?

## 2022-02-02 ENCOUNTER — Inpatient Hospital Stay (HOSPITAL_COMMUNITY): Payer: 59

## 2022-02-02 ENCOUNTER — Emergency Department (HOSPITAL_COMMUNITY): Payer: 59

## 2022-02-02 ENCOUNTER — Observation Stay (HOSPITAL_COMMUNITY): Payer: 59

## 2022-02-02 DIAGNOSIS — E861 Hypovolemia: Secondary | ICD-10-CM | POA: Diagnosis not present

## 2022-02-02 DIAGNOSIS — Z87891 Personal history of nicotine dependence: Secondary | ICD-10-CM | POA: Diagnosis not present

## 2022-02-02 DIAGNOSIS — Z79899 Other long term (current) drug therapy: Secondary | ICD-10-CM | POA: Diagnosis not present

## 2022-02-02 DIAGNOSIS — Z90710 Acquired absence of both cervix and uterus: Secondary | ICD-10-CM | POA: Diagnosis not present

## 2022-02-02 DIAGNOSIS — B9620 Unspecified Escherichia coli [E. coli] as the cause of diseases classified elsewhere: Secondary | ICD-10-CM | POA: Diagnosis not present

## 2022-02-02 DIAGNOSIS — E876 Hypokalemia: Secondary | ICD-10-CM | POA: Diagnosis not present

## 2022-02-02 DIAGNOSIS — R0602 Shortness of breath: Secondary | ICD-10-CM | POA: Diagnosis not present

## 2022-02-02 DIAGNOSIS — R519 Headache, unspecified: Secondary | ICD-10-CM | POA: Diagnosis not present

## 2022-02-02 DIAGNOSIS — I455 Other specified heart block: Secondary | ICD-10-CM | POA: Diagnosis not present

## 2022-02-02 DIAGNOSIS — I110 Hypertensive heart disease with heart failure: Secondary | ICD-10-CM | POA: Diagnosis not present

## 2022-02-02 DIAGNOSIS — I5032 Chronic diastolic (congestive) heart failure: Secondary | ICD-10-CM | POA: Diagnosis not present

## 2022-02-02 DIAGNOSIS — Z8616 Personal history of COVID-19: Secondary | ICD-10-CM | POA: Diagnosis not present

## 2022-02-02 DIAGNOSIS — G9341 Metabolic encephalopathy: Secondary | ICD-10-CM | POA: Diagnosis not present

## 2022-02-02 DIAGNOSIS — J811 Chronic pulmonary edema: Secondary | ICD-10-CM | POA: Diagnosis not present

## 2022-02-02 DIAGNOSIS — Z8249 Family history of ischemic heart disease and other diseases of the circulatory system: Secondary | ICD-10-CM | POA: Diagnosis not present

## 2022-02-02 DIAGNOSIS — R000 Tachycardia, unspecified: Secondary | ICD-10-CM | POA: Diagnosis not present

## 2022-02-02 DIAGNOSIS — R0902 Hypoxemia: Secondary | ICD-10-CM | POA: Diagnosis not present

## 2022-02-02 DIAGNOSIS — Z880 Allergy status to penicillin: Secondary | ICD-10-CM | POA: Diagnosis not present

## 2022-02-02 DIAGNOSIS — Z6841 Body Mass Index (BMI) 40.0 and over, adult: Secondary | ICD-10-CM | POA: Diagnosis not present

## 2022-02-02 DIAGNOSIS — R778 Other specified abnormalities of plasma proteins: Secondary | ICD-10-CM | POA: Diagnosis present

## 2022-02-02 DIAGNOSIS — Z800 Family history of malignant neoplasm of digestive organs: Secondary | ICD-10-CM | POA: Diagnosis not present

## 2022-02-02 DIAGNOSIS — N390 Urinary tract infection, site not specified: Secondary | ICD-10-CM | POA: Diagnosis not present

## 2022-02-02 DIAGNOSIS — Z833 Family history of diabetes mellitus: Secondary | ICD-10-CM | POA: Diagnosis not present

## 2022-02-02 DIAGNOSIS — Y92009 Unspecified place in unspecified non-institutional (private) residence as the place of occurrence of the external cause: Secondary | ICD-10-CM | POA: Diagnosis not present

## 2022-02-02 DIAGNOSIS — R079 Chest pain, unspecified: Secondary | ICD-10-CM | POA: Diagnosis not present

## 2022-02-02 DIAGNOSIS — T502X5A Adverse effect of carbonic-anhydrase inhibitors, benzothiadiazides and other diuretics, initial encounter: Secondary | ICD-10-CM | POA: Diagnosis not present

## 2022-02-02 DIAGNOSIS — R001 Bradycardia, unspecified: Secondary | ICD-10-CM | POA: Diagnosis not present

## 2022-02-02 DIAGNOSIS — K219 Gastro-esophageal reflux disease without esophagitis: Secondary | ICD-10-CM | POA: Diagnosis not present

## 2022-02-02 DIAGNOSIS — R4182 Altered mental status, unspecified: Secondary | ICD-10-CM | POA: Diagnosis present

## 2022-02-02 DIAGNOSIS — E871 Hypo-osmolality and hyponatremia: Secondary | ICD-10-CM | POA: Diagnosis not present

## 2022-02-02 LAB — RAPID URINE DRUG SCREEN, HOSP PERFORMED
Amphetamines: NOT DETECTED
Barbiturates: NOT DETECTED
Benzodiazepines: NOT DETECTED
Cocaine: NOT DETECTED
Opiates: NOT DETECTED
Tetrahydrocannabinol: NOT DETECTED

## 2022-02-02 LAB — URINALYSIS, ROUTINE W REFLEX MICROSCOPIC
Bilirubin Urine: NEGATIVE
Glucose, UA: NEGATIVE mg/dL
Ketones, ur: 20 mg/dL — AB
Leukocytes,Ua: NEGATIVE
Nitrite: NEGATIVE
Protein, ur: NEGATIVE mg/dL
RBC / HPF: >50 RBC/hpf — ABNORMAL HIGH (ref 0–5)
Specific Gravity, Urine: 1.004 — ABNORMAL LOW (ref 1.005–1.030)
pH: 6 (ref 5.0–8.0)

## 2022-02-02 LAB — AMMONIA: Ammonia: 35 umol/L (ref 9–35)

## 2022-02-02 LAB — CBC
HCT: 47.9 % — ABNORMAL HIGH (ref 36.0–46.0)
HCT: 49.9 % — ABNORMAL HIGH (ref 36.0–46.0)
Hemoglobin: 14.6 g/dL (ref 12.0–15.0)
Hemoglobin: 14.9 g/dL (ref 12.0–15.0)
MCH: 23.1 pg — ABNORMAL LOW (ref 26.0–34.0)
MCH: 23.4 pg — ABNORMAL LOW (ref 26.0–34.0)
MCHC: 29.9 g/dL — ABNORMAL LOW (ref 30.0–36.0)
MCHC: 30.5 g/dL (ref 30.0–36.0)
MCV: 76.6 fL — ABNORMAL LOW (ref 80.0–100.0)
MCV: 77.5 fL — ABNORMAL LOW (ref 80.0–100.0)
Platelets: 317 10*3/uL (ref 150–400)
Platelets: 327 10*3/uL (ref 150–400)
RBC: 6.25 MIL/uL — ABNORMAL HIGH (ref 3.87–5.11)
RBC: 6.44 MIL/uL — ABNORMAL HIGH (ref 3.87–5.11)
RDW: 16.3 % — ABNORMAL HIGH (ref 11.5–15.5)
RDW: 17.2 % — ABNORMAL HIGH (ref 11.5–15.5)
WBC: 5 10*3/uL (ref 4.0–10.5)
WBC: 5 10*3/uL (ref 4.0–10.5)
nRBC: 0 % (ref 0.0–0.2)
nRBC: 0 % (ref 0.0–0.2)

## 2022-02-02 LAB — BLOOD GAS, VENOUS
Acid-Base Excess: 7.3 mmol/L — ABNORMAL HIGH (ref 0.0–2.0)
Bicarbonate: 29.7 mmol/L — ABNORMAL HIGH (ref 20.0–28.0)
O2 Saturation: 99.7 %
Patient temperature: 37
pCO2, Ven: 34 mmHg — ABNORMAL LOW (ref 44–60)
pH, Ven: 7.55 — ABNORMAL HIGH (ref 7.25–7.43)
pO2, Ven: 168 mmHg — ABNORMAL HIGH (ref 32–45)

## 2022-02-02 LAB — BASIC METABOLIC PANEL
Anion gap: 12 (ref 5–15)
Anion gap: 16 — ABNORMAL HIGH (ref 5–15)
BUN: <5 mg/dL — ABNORMAL LOW (ref 8–23)
BUN: <5 mg/dL — ABNORMAL LOW (ref 8–23)
CO2: 28 mmol/L (ref 22–32)
CO2: 32 mmol/L (ref 22–32)
Calcium: 9.4 mg/dL (ref 8.9–10.3)
Calcium: 9.8 mg/dL (ref 8.9–10.3)
Chloride: 88 mmol/L — ABNORMAL LOW (ref 98–111)
Chloride: 90 mmol/L — ABNORMAL LOW (ref 98–111)
Creatinine, Ser: 0.61 mg/dL (ref 0.44–1.00)
Creatinine, Ser: 0.71 mg/dL (ref 0.44–1.00)
GFR, Estimated: >60 mL/min (ref >60)
GFR, Estimated: >60 mL/min (ref >60)
Glucose, Bld: 108 mg/dL — ABNORMAL HIGH (ref 70–99)
Glucose, Bld: 117 mg/dL — ABNORMAL HIGH (ref 70–99)
Potassium: 2.8 mmol/L — ABNORMAL LOW (ref 3.5–5.1)
Potassium: 3.1 mmol/L — ABNORMAL LOW (ref 3.5–5.1)
Sodium: 132 mmol/L — ABNORMAL LOW (ref 135–145)
Sodium: 134 mmol/L — ABNORMAL LOW (ref 135–145)

## 2022-02-02 LAB — LIPID PANEL
Cholesterol: 163 mg/dL (ref 0–200)
HDL: 78 mg/dL (ref >40)
LDL Cholesterol: 80 mg/dL (ref 0–99)
Total CHOL/HDL Ratio: 2.1 RATIO
Triglycerides: 25 mg/dL (ref <150)
VLDL: 5 mg/dL (ref 0–40)

## 2022-02-02 LAB — HEPARIN LEVEL (UNFRACTIONATED): Heparin Unfractionated: 0.11 IU/mL — ABNORMAL LOW (ref 0.30–0.70)

## 2022-02-02 LAB — TROPONIN I (HIGH SENSITIVITY)
Troponin I (High Sensitivity): 34 ng/L — ABNORMAL HIGH (ref <18)
Troponin I (High Sensitivity): 35 ng/L — ABNORMAL HIGH (ref <18)
Troponin I (High Sensitivity): 35 ng/L — ABNORMAL HIGH (ref <18)
Troponin I (High Sensitivity): 40 ng/L — ABNORMAL HIGH (ref <18)

## 2022-02-02 LAB — CORTISOL: Cortisol, Plasma: 14 ug/dL

## 2022-02-02 LAB — BRAIN NATRIURETIC PEPTIDE: B Natriuretic Peptide: 20.2 pg/mL (ref 0.0–100.0)

## 2022-02-02 LAB — T4, FREE: Free T4: 1.2 ng/dL — ABNORMAL HIGH (ref 0.61–1.12)

## 2022-02-02 LAB — OSMOLALITY, URINE: Osmolality, Ur: 197 mOsm/kg — ABNORMAL LOW (ref 300–900)

## 2022-02-02 LAB — TSH: TSH: 1.377 u[IU]/mL (ref 0.350–4.500)

## 2022-02-02 LAB — SODIUM, URINE, RANDOM: Sodium, Ur: 40 mmol/L

## 2022-02-02 LAB — MAGNESIUM: Magnesium: 2 mg/dL (ref 1.7–2.4)

## 2022-02-02 LAB — D-DIMER, QUANTITATIVE: D-Dimer, Quant: <0.27 ug/mL-FEU (ref 0.00–0.50)

## 2022-02-02 LAB — HIV ANTIBODY (ROUTINE TESTING W REFLEX): HIV Screen 4th Generation wRfx: NONREACTIVE

## 2022-02-02 LAB — OSMOLALITY: Osmolality: 281 mOsm/kg (ref 275–295)

## 2022-02-02 MED ORDER — ASPIRIN EC 81 MG PO TBEC
81.0000 mg | DELAYED_RELEASE_TABLET | Freq: Every day | ORAL | Status: DC
  Administered 2022-02-02: 81 mg via ORAL
  Filled 2022-02-02: qty 1

## 2022-02-02 MED ORDER — AMLODIPINE BESYLATE 10 MG PO TABS
10.0000 mg | ORAL_TABLET | Freq: Every day | ORAL | Status: DC
  Administered 2022-02-02 – 2022-02-03 (×2): 10 mg via ORAL
  Filled 2022-02-02: qty 2
  Filled 2022-02-02: qty 1

## 2022-02-02 MED ORDER — LACTATED RINGERS IV SOLN: INTRAVENOUS | Status: DC

## 2022-02-02 MED ORDER — POTASSIUM CHLORIDE CRYS ER 20 MEQ PO TBCR
40.0000 meq | EXTENDED_RELEASE_TABLET | Freq: Once | ORAL | Status: AC
  Administered 2022-02-02: 40 meq via ORAL
  Filled 2022-02-02: qty 2

## 2022-02-02 MED ORDER — HEPARIN (PORCINE) 25000 UT/250ML-% IV SOLN
1200.0000 [IU]/h | INTRAVENOUS | Status: DC
Start: 2022-02-02 — End: 2022-02-02
  Administered 2022-02-02: 1200 [IU]/h via INTRAVENOUS
  Filled 2022-02-02: qty 250

## 2022-02-02 MED ORDER — AMLODIPINE BESYLATE 5 MG PO TABS: 10.0000 mg | ORAL_TABLET | Freq: Every day | ORAL | Status: DC

## 2022-02-02 MED ORDER — LACTATED RINGERS IV SOLN
INTRAVENOUS | Status: DC
Start: 2022-02-02 — End: 2022-02-02
  Filled 2022-02-02: qty 1000

## 2022-02-02 MED ORDER — PROCHLORPERAZINE EDISYLATE 10 MG/2ML IJ SOLN
10.0000 mg | Freq: Once | INTRAMUSCULAR | Status: AC
  Administered 2022-02-02: 10 mg via INTRAVENOUS
  Filled 2022-02-02: qty 2

## 2022-02-02 MED ORDER — ASPIRIN 81 MG PO CHEW
324.0000 mg | CHEWABLE_TABLET | Freq: Once | ORAL | Status: AC
  Administered 2022-02-02: 324 mg via ORAL
  Filled 2022-02-02: qty 4

## 2022-02-02 MED ORDER — MELATONIN 3 MG PO TABS: 3.0000 mg | ORAL_TABLET | Freq: Every evening | ORAL | Status: DC | PRN

## 2022-02-02 MED ORDER — POLYETHYLENE GLYCOL 3350 17 G PO PACK
17.0000 g | PACK | Freq: Every day | ORAL | Status: DC | PRN
Start: 2022-02-02 — End: 2022-02-05

## 2022-02-02 MED ORDER — ATORVASTATIN CALCIUM 40 MG PO TABS
40.0000 mg | ORAL_TABLET | Freq: Every day | ORAL | Status: DC
  Administered 2022-02-02 – 2022-02-05 (×4): 40 mg via ORAL
  Filled 2022-02-02 (×4): qty 1

## 2022-02-02 MED ORDER — HEPARIN BOLUS VIA INFUSION
4000.0000 [IU] | Freq: Once | INTRAVENOUS | Status: AC
Start: 2022-02-02 — End: 2022-02-02
  Administered 2022-02-02: 4000 [IU] via INTRAVENOUS
  Filled 2022-02-02: qty 4000

## 2022-02-02 MED ORDER — METOPROLOL TARTRATE 5 MG/5ML IV SOLN
2.5000 mg | Freq: Four times a day (QID) | INTRAVENOUS | Status: DC | PRN
  Administered 2022-02-02: 2.5 mg via INTRAVENOUS
  Filled 2022-02-02: qty 5

## 2022-02-02 MED ORDER — POTASSIUM CHLORIDE CRYS ER 20 MEQ PO TBCR
40.0000 meq | EXTENDED_RELEASE_TABLET | Freq: Once | ORAL | Status: AC
Start: 2022-02-02 — End: 2022-02-02
  Administered 2022-02-02: 40 meq via ORAL
  Filled 2022-02-02: qty 2

## 2022-02-02 MED ORDER — POTASSIUM CHLORIDE 10 MEQ/100ML IV SOLN
10.0000 meq | INTRAVENOUS | Status: AC
  Administered 2022-02-02 (×3): 10 meq via INTRAVENOUS
  Filled 2022-02-02 (×3): qty 100

## 2022-02-02 MED ORDER — ACETAMINOPHEN 325 MG PO TABS
650.0000 mg | ORAL_TABLET | Freq: Four times a day (QID) | ORAL | Status: DC | PRN
  Administered 2022-02-05: 650 mg via ORAL
  Filled 2022-02-02: qty 2

## 2022-02-02 MED ORDER — ADULT MULTIVITAMIN W/MINERALS CH
1.0000 | ORAL_TABLET | Freq: Every day | ORAL | Status: DC
  Administered 2022-02-02 – 2022-02-05 (×4): 1 via ORAL
  Filled 2022-02-02 (×4): qty 1

## 2022-02-02 MED ORDER — PROCHLORPERAZINE EDISYLATE 10 MG/2ML IJ SOLN: 10.0000 mg | Freq: Four times a day (QID) | INTRAMUSCULAR | Status: DC | PRN

## 2022-02-02 MED ORDER — POTASSIUM CHLORIDE IN NACL 20-0.9 MEQ/L-% IV SOLN
INTRAVENOUS | Status: DC
  Filled 2022-02-02 (×2): qty 1000

## 2022-02-02 NOTE — ED Notes (Addendum)
Pt called out to nurses station x 3. Explained to pt x 3 that nurses were in report and someone would be in shortly. When this RN went into room, IV was occluded and this RN educated pt on keeping her arm straight for her to receive her medication. Pt verbalized understanding and straightened her arm. Pt requested water and stated, "I need it right now!". Explained to pt that I would have to check her orders to see what her diet is regarding fluids. Pt was visibly frustrated. This RN looked at diet order and then educated pt that her order says NPO time specified w/ sips w/ meds and I am unable to give her anything to drink other than the sips w/ meds. Pt verbalized understanding. ?

## 2022-02-02 NOTE — Progress Notes (Signed)
?PROGRESS NOTE ? ? ? ?Tamara Russell  [id number] DOB: Oct 09, 1961 DOA: 02/01/2022 ?PCP: [name], MD  ?Outpatient Specialists:  ? ? ? ?Brief Narrative:  ?Patient is a 61 year old female with past medical history significant for obesity, essential hypertension, reformed tobacco user (cigarette smoker), COVID-19 infection in 2020.  H&P done earlier was reviewed.  As per H&P, patient presented with shortness of breath, possible angina equivalent.  On arrival to the hospital, O2 sat was 88% on room air, improved to the 90s on 2 L supplemental oxygen via nasal cannula.  Negative D-dimer.  Troponin ranged from 35-40, negative cardiac BNP, nonacute chest x-ray.  Potassium of 2.8 reported. ? ?02/02/2022: Patient seen alongside patient's husband.  Patient could not give any significant history.  History from the patient's daughter was the patient will has been talking out of her head.  The daughter is not a particularly good historian as well.  Discussed case with cardiology team.  UA revealed many bacteria, no leukocyte esterase, and RBCs.  UA also had ketones.  Venous blood done earlier revealed pH of 7.55, PCO2 of 35 and PO2 of 168.  Repeat BMP revealed sodium of 133, potassium of 3.1, chloride of 88, CO2 28 and blood sugar of 198.  BUN is less than 5 with creatinine of 0.61. ? ? ?Assessment & Plan: ?  ?Principal Problem: ?  Elevated troponin ? ?Elevated troponin, rule out ACS. ?-Troponin is only minimally elevated and not rising. ?-Troponin ranges from 34-40. ?-Cardiology team has already been consulted. ?-Patient is also on heparin. ?-We defer further care to the cardiology team. ?-No chest pain currently.   ?  ?Altered mental status: ?-Likely metabolic. ?-Repeat ABG. ?-Check ammonia level. ?-CT head. ?-Continue to obtain history from patient and patient's daughter is much as possible. ?-Hydrate patient. ?-Correct hypokalemia. ?-Suspect hyponatremia secondary to volume depletion. ?  ?Hypokalemia,  moderate ?Presented with serum potassium of 2.8 ?-Etiology unclear.   ?-Suspect secondary to medication.  Patient was HCTZ and chlorthalidone prior to admission ?-Repeat potassium is 3.1. ?-IV fluid normal saline plus potassium. ?-K-Dur 40 mEq p.o. x1 dose. ?-Continue to monitor and replete. ? ?Hyponatremia:  ?-Suspect volume related. ?-Patient was on 2 diuretics prior to admission. ?-Diuretics are currently on hold. ?-Check urine sodium, urine osmolality, serum osmolality and cortisol. ?-TSH is within normal range. ?-Continue to monitor sodium level serum sodium  ?-Last sodium was 132. ?  ?Essential hypertension: ?-Currently controlled. ?-Continue to monitor closely.    ? ?Severe morbid obesity ?-Diet and exercise. ?-Consider semaglutide or tirzepatide on discharge if not contraindicated ? ?Suspect OSA and possible CO2 retention ?-Venous blood gas noted. ?-Repeat ABG. ?-Check ammonia level.   ? ?DVT prophylaxis: Heparin drip. ?Code Status: Full code ?Family Communication:  ?Disposition Plan:  ? ? ?Consultants:  ?Cardiology, Dr. Jacinto Halim. ? ?Procedures:  ?None. ? ?Antimicrobials:  ?None ? ? ?Subjective: ?Patient is a poor historian. ? ? ?Objective: ?Vitals:  ? 02/02/22 0532 02/02/22 0600 02/02/22 0700 02/02/22 1000  ?BP: 106/85 (!) 108/92 (!) 150/87 111/70  ?Pulse: 64 87 95 76  ?Resp: 16 20 (!) 27 (!) 21  ?Temp:  (!) 97.5 ?F (36.4 ?C)    ?TempSrc:  Oral    ?SpO2: 97% 95% 98% 98%  ? ? ?Intake/Output Summary (Last 24 hours) at 02/02/2022 1059 ?Last data filed at 02/02/2022 [phone] ?Gross per 24 hour  ?Intake 300 ml  ?Output --  ?Net 300 ml  ? ?There were no vitals filed for this visit. ? ?Examination: ? ?General  exam: Patient is sleepy.  Not a particularly good historian.  Morbidly obese.  Not in any distress.   ?Respiratory system: Clear to auscultation.  ?Cardiovascular system: S1 & S2  ?Gastrointestinal system: Abdomen is morbidly obese, soft and nontender. Central nervous system: Sleepy.  Seems to move all extremities.    ? ? ?Data Reviewed: I have personally reviewed following labs and imaging studies ? ?CBC: ?Recent Labs  ?Lab 02/01/22 ?2336 02/02/22 ?0424  ?WBC 5.0 5.0  ?HGB 14.6 14.9  ?HCT 47.9* 49.9*  ?MCV 76.6* 77.5*  ?PLT 317 327  ? ?Basic Metabolic Panel: ?Recent Labs  ?Lab 02/01/22 ?2336 02/02/22 ?0424  ?NA 134* 132*  ?K 2.8* 3.1*  ?CL 90* 88*  ?CO2 32 28  ?GLUCOSE 117* 108*  ?BUN <5* <5*  ?CREATININE 0.71 0.61  ?CALCIUM 9.8 9.4  ?MG 2.0  --   ? ?GFR: ?CrCl cannot be calculated (Unknown ideal weight.). ?Liver Function Tests: ?No results for input(s): AST, ALT, ALKPHOS, BILITOT, PROT, ALBUMIN in the last 168 hours. ?No results for input(s): LIPASE, AMYLASE in the last 168 hours. ?No results for input(s): AMMONIA in the last 168 hours. ?Coagulation Profile: ?No results for input(s): INR, PROTIME in the last 168 hours. ?Cardiac Enzymes: ?No results for input(s): CKTOTAL, CKMB, CKMBINDEX, TROPONINI in the last 168 hours. ?BNP (last 3 results) ?No results for input(s): PROBNP in the last 8760 hours. ?HbA1C: ?No results for input(s): HGBA1C in the last 72 hours. ?CBG: ?No results for input(s): GLUCAP in the last 168 hours. ?Lipid Profile: ?Recent Labs  ?  02/02/22 ?0424  ?CHOL 163  ?HDL 78  ?LDLCALC 80  ?TRIG 25  ?CHOLHDL 2.1  ? ?Thyroid Function Tests: ?Recent Labs  ?  02/02/22 ?9628  ?TSH 1.377  ? ?Anemia Panel: ?No results for input(s): VITAMINB12, FOLATE, FERRITIN, TIBC, IRON, RETICCTPCT in the last 72 hours. ?Urine analysis: ?   ?Component Value Date/Time  ? COLORURINE YELLOW 12/03/2019 2030  ? APPEARANCEUR HAZY (A) 12/03/2019 2030  ? LABSPEC 1.009 12/03/2019 2030  ? PHURINE 7.0 12/03/2019 2030  ? GLUCOSEU NEGATIVE 12/03/2019 2030  ? HGBUR NEGATIVE 12/03/2019 2030  ? BILIRUBINUR NEGATIVE 12/03/2019 2030  ? KETONESUR NEGATIVE 12/03/2019 2030  ? PROTEINUR NEGATIVE 12/03/2019 2030  ? NITRITE NEGATIVE 12/03/2019 2030  ? LEUKOCYTESUR NEGATIVE 12/03/2019 2030  ? ?Sepsis Labs: ?@[email or web address] (procalcitonin:4,lacticidven:4) ? ?)No  results found for this or any previous visit (from the past 240 hour(s)).  ? ? ? ? ? ?Radiology Studies: ?DG Chest 2 View ? ?Result Date: 02/02/2022 ?CLINICAL DATA:  Shortness of breath. EXAM: CHEST - 2 VIEW COMPARISON:  Chest x-ray 12/03/2019. FINDINGS: The heart size and mediastinal contours are within normal limits. Both lungs are clear. The visualized skeletal structures are unremarkable. IMPRESSION: No active cardiopulmonary disease. Electronically Signed   By: 12/05/2019 M.D.   On: 02/02/2022 00:21   ? ? ? ? ? ?Scheduled Meds: ? amLODipine  10 mg Oral Daily  ? aspirin EC  81 mg Oral Daily  ? atorvastatin  40 mg Oral Daily  ? multivitamin with minerals  1 tablet Oral Daily  ? ?Continuous Infusions: ? heparin 1,200 Units/hr (02/02/22 0342)  ? lactated ringers with kcl 50 mL/hr at 02/02/22 0442  ? ? ? LOS: 0 days  ? ? ?Time spent: 35 minutes. ? ? ? ?02/04/22, MD  ?[facility] ?Pager #: [phone] ?7PM-7AM contact night coverage as above ? ? ?  ?

## 2022-02-02 NOTE — ED Notes (Signed)
ED TO INPATIENT HANDOFF REPORT ? ?ED Nurse Name and Phone #: Delice Bisonara, RN ? ?S ?Name/Age/Gender ?Tamara Russell ?61 y.o. ?female ?Room/Bed: 038C/038C ? ?Code Status ?  Code Status: Full Code ? ?Home/SNF/Other ?Home ?Patient oriented to: self, place, time, and situation ?Is this baseline? Yes  ? ?Triage Complete: Triage complete  ?Chief Complaint ?Elevated troponin [R77.8] ? ?Triage Note ?Pt c/o shortness of breath, swelling to BLE and chest fluttering. SpO2 88% on room air, 2LNC applied with improvement.  ? ?Allergies ?Allergies  ?Allergen Reactions  ? Penicillins Other (See Comments)  ?  Childhood allergy. Pt does not remember reaction  ? ? ?Level of Care/Admitting Diagnosis ?ED Disposition   ? ? ED Disposition  ?Admit  ? Condition  ?--  ? Comment  ?[facility]ospital [facility] [[id number]] ? Level of Care: Telemetry Cardiac [103] ? [facility] patient [facility] if equivalent level of care is available:: Yes ? Covid Evaluation: Asymptomatic - no recent exposure (last 10 days) testing not required ? Diagnosis: Elevated troponin [321909] ? Admitting Physician: Darlin DropHALL, CAROLE N [[id number]] ? Attending Physician: Darlin DropHALL, CAROLE N [[id number]] ?  ?  ? ?  ? ? ?B ?Medical/Surgery History ?Past Medical History:  ?Diagnosis Date  ? Acid reflux   ? Complication of anesthesia   ? slow to wake up  ? History of COVID-19   ? Hypertension   ? ?Past Surgical History:  ?Procedure Laterality Date  ? ABDOMINAL HYSTERECTOMY    ? BIOPSY  11/17/2018  ? Procedure: BIOPSY;  Surgeon: Beverley FiedlerPyrtle, Jay M, MD;  [facility];  Service: Gastroenterology;;  ? BREAST EXCISIONAL BIOPSY Left   ? benign  ? BREAST EXCISIONAL BIOPSY Right   ? benign  ? ESOPHAGOGASTRODUODENOSCOPY (EGD) WITH PROPOFOL N/A 11/17/2018  ? Procedure: ESOPHAGOGASTRODUODENOSCOPY (EGD) WITH PROPOFOL;  Surgeon: Beverley FiedlerPyrtle, Jay M, MD;  [facility];  Service: Gastroenterology;  Laterality: N/A;  ?  ? ?A ?IV Location/Drains/Wounds ?Patient  Lines/Drains/Airways Status   ? ? Active Line/Drains/Airways   ? ? Name Placement date Placement time Site Days  ? Peripheral IV 02/02/22 20 G Right Antecubital 02/02/22  0254  Antecubital  less than 1  ? Peripheral IV 02/02/22 Left Forearm 02/02/22  0339  Forearm  less than 1  ? External Urinary Catheter 02/02/22  1037  --  less than 1  ? ?  ?  ? ?  ? ? ?Intake/Output Last 24 hours ? ?Intake/Output Summary (Last 24 hours) at 02/02/2022 1450 ?Last data filed at 02/02/2022 1243 ?Gross per 24 hour  ?Intake 446.82 ml  ?Output --  ?Net 446.82 ml  ? ? ?Labs/Imaging ?Results for orders placed or performed during the hospital encounter of 02/01/22 (from the past 48 hour(s))  ?Basic metabolic panel     Status: Abnormal  ? Collection Time: 02/01/22 11:36 PM  ?Result Value Ref Range  ? Sodium 134 (L) 135 - 145 mmol/L  ? Potassium 2.8 (L) 3.5 - 5.1 mmol/L  ? Chloride 90 (L) 98 - 111 mmol/L  ? CO2 32 22 - 32 mmol/L  ? Glucose, Bld 117 (H) 70 - 99 mg/dL  ?  Comment: Glucose reference range applies only to samples taken after fasting for at least 8 hours.  ? BUN <5 (L) 8 - 23 mg/dL  ? Creatinine, Ser 0.71 0.44 - 1.00 mg/dL  ? Calcium 9.8 8.9 - 10.3 mg/dL  ? GFR, Estimated >60 >60 mL/min  ?  Comment: (NOTE) ?Calculated  using the CKD-EPI Creatinine Equation (2021) ?  ? Anion gap 12 5 - 15  ?  Comment: Performed at [facility], [address], [place], [place] [address]  ?CBC     Status: Abnormal  ? Collection Time: 02/01/22 11:36 PM  ?Result Value Ref Range  ? WBC 5.0 4.0 - 10.5 K/uL  ? RBC 6.25 (H) 3.87 - 5.11 MIL/uL  ? Hemoglobin 14.6 12.0 - 15.0 g/dL  ? HCT 47.9 (H) 36.0 - 46.0 %  ? MCV 76.6 (L) 80.0 - 100.0 fL  ? MCH 23.4 (L) 26.0 - 34.0 pg  ? MCHC 30.5 30.0 - 36.0 g/dL  ? RDW 16.3 (H) 11.5 - 15.5 %  ? Platelets 317 150 - 400 K/uL  ? nRBC 0.0 0.0 - 0.2 %  ?  Comment: Performed at [facility], [address] [place], [place] [address]  ?Troponin I (High Sensitivity)     Status: Abnormal  ? Collection Time: 02/01/22  11:36 PM  ?Result Value Ref Range  ? Troponin I (High Sensitivity) 35 (H) <18 ng/L  ?  Comment: (NOTE) ?Elevated high sensitivity troponin I (hsTnI) values and significant  ?changes across serial measurements may suggest ACS but many other  ?chronic and acute conditions are known to elevate hsTnI results.  ?Refer to the Links section for chest pain algorithms and additional  ?guidance. ?Performed at [facility], [address], [place], [place] ?[address] ?  ?Magnesium     Status: None  ? Collection Time: 02/01/22 11:36 PM  ?Result Value Ref Range  ? Magnesium 2.0 1.7 - 2.4 mg/dL  ?  Comment: Performed at [facility], [address] [place], [place] [address]  ?D-dimer, quantitative     Status: None  ? Collection Time: 02/01/22 11:36 PM  ?Result Value Ref Range  ? D-Dimer, Quant <0.27 0.00 - 0.50 ug/mL-FEU  ?  Comment: (NOTE) ?At the manufacturer cut-off value of 0.5 ?g/mL FEU, this assay has a ?negative predictive value of 95-100%.This assay is intended for use ?in conjunction with a clinical pretest probability (PTP) assessment ?model to exclude pulmonary embolism (PE) and deep venous thrombosis ?(DVT) in outpatients suspected of PE or DVT. ?Results should be correlated with clinical presentation. ?Performed at [facility], [address], [place], [place] ?[address] ?  ?Brain natriuretic peptide     Status: None  ? Collection Time: 02/02/22  1:46 AM  ?Result Value Ref Range  ? B Natriuretic Peptide 20.2 0.0 - 100.0 pg/mL  ?  Comment: Performed at [facility], [address] [place], [place] [address]  ?Troponin I (High Sensitivity)     Status: Abnormal  ? Collection Time: 02/02/22  1:46 AM  ?Result Value Ref Range  ? Troponin I (High Sensitivity) 40 (H) <18 ng/L  ?  Comment: (NOTE) ?Elevated high sensitivity troponin I (hsTnI) values and significant  ?changes across serial measurements may suggest ACS but many other  ?chronic and acute conditions are known to elevate hsTnI results.   ?Refer to the "Links" section for chest pain algorithms and additional  ?guidance. ?Performed at [facility], [address] [place], [place] ?[address] ?  ?HIV Antibody (routine testing w rflx)     Status: None  ? Collection Time: 02/02/22  4:24 AM  ?Result Value Ref Range  ? HIV Screen 4th Generation wRfx Non Reactive Non Reactive  ?  Comment: Performed at [facility], [address] [place], [place] [address]  ?Lipid panel     Status: None  ? Collection Time: 02/02/22  4:24 AM  ?Result Value Ref Range  ? Cholesterol 163 0 - 200 mg/dL  ? Triglycerides 25 <150 mg/dL  ? HDL 78 >40 mg/dL  ? Total CHOL/HDL Ratio 2.1 RATIO  ? VLDL 5 0 - 40 mg/dL  ? LDL Cholesterol 80 0 - 99 mg/dL  ?  Comment:        ?Total Cholesterol/HDL:CHD Risk ?Coronary Heart Disease Risk Table ?                    Men   Women ? 1/2 Average Risk   3.4   3.3 ? Average Risk       5.0   4.4 ? 2 X Average Risk   9.6   7.1 ? 3 X Average Risk  23.4   11.0 ?       ?Use the calculated Patient Ratio ?above and the CHD Risk Table ?to determine the patient's CHD Risk. ?       ?ATP III CLASSIFICATION (LDL): ? <100     mg/dL   Optimal ? 226-333  mg/dL   Near or Above ?                   Optimal ? 130-159  mg/dL   Borderline ? 160-189  mg/dL   High ? >545     mg/dL   Very High ?Performed at [facility], [address], [place], [place] [address] ?  ?CBC     Status: Abnormal  ? Collection Time: 02/02/22  4:24 AM  ?Result Value Ref Range  ? WBC 5.0 4.0 - 10.5 K/uL  ? RBC 6.44 (H) 3.87 - 5.11 MIL/uL  ? Hemoglobin 14.9 12.0 - 15.0 g/dL  ? HCT 49.9 (H) 36.0 - 46.0 %  ? MCV 77.5 (L) 80.0 - 100.0 fL  ? MCH 23.1 (L) 26.0 - 34.0 pg  ? MCHC 29.9 (L) 30.0 - 36.0 g/dL  ? RDW 17.2 (H) 11.5 - 15.5 %  ? Platelets 327 150 - 400 K/uL  ? nRBC 0.0 0.0 - 0.2 %  ?  Comment: Performed at [facility], [address] [place], [place] [address]  ?Basic metabolic panel     Status: Abnormal  ? Collection Time: 02/02/22  4:24 AM  ?Result Value Ref Range  ?  Sodium 132 (L) 135 - 145 mmol/L  ? Potassium 3.1 (L) 3.5 - 5.1 mmol/L  ? Chloride 88 (L) 98 - 111 mmol/L  ? CO2 28 22 - 32 mmol/L  ? Glucose, Bld 108 (H) 70 - 99 mg/dL  ?  Comment: Glucose reference ra

## 2022-02-02 NOTE — ED Notes (Signed)
Pt assisted with bedside commode.

## 2022-02-02 NOTE — H&P (Addendum)
?History and Physical ? ?Tamara Russell:[id number] DOB: 06/22/1961 DOA: 02/01/2022 ? ?Referring physician: Dr. Preston Fleeting, EDP  ?PCP: [name], MD  ?Outpatient Specialists: Cardiology, [place]. ?Patient coming from: Home via EMS. ? ?Chief Complaint: Feeling bad, shortness of breath, nausea, bilateral lower extremity swelling. ? ?HPI: Tamara Russell is a 61 y.o. female with medical history significant for obesity, essential hypertension, former smoker, history of COVID-19 infection in 2020, who presented to [facility] ED due to feeling bad, associated with shortness of breath, nausea, and mild bilateral lower extremity edema.  No chest pain.  No abdominal pain.  No constipation or diarrhea.  She endorses feeling weak during the day and tonight she felt worse with shortness of breath at rest and nausea without vomiting.  EMS was activated.  Upon arrival O2 saturation was 88% on room air, improved to the mid 90s on 2 L nasal cannula.  In the ED, she denies chest pain.  At times she feels hot then cold.  Initial troponin 35, up-trended to 40.  D-dimer negative.  BNP negative.  Chest x-ray nonacute.  Potassium 2.8.  Electrolytes replacement were initiated in the ED.  Due to concern for possible anginal equivalent, patient received a full dose of aspirin and EDP started heparin drip.  EDP requested admission to rule out ACS.  The patient was admitted by hospitalist service, [facility]. ? ?ED Course:  Tmax 97.7.  BP 148/88, pulse 86, respiration rate 22, O2 saturation 95% on 2 L.  CBC essentially unremarkable, except hemoglobin 14.6 and MCV 76.  Serum sodium 134, potassium 2.8, glucose 117, creatinine 0.71, troponin 40 from 35. ? ?Review of Systems: ?Review of systems as noted in the HPI. All other systems reviewed and are negative. ? ? ?Past Medical History:  ?Diagnosis Date  ? Acid reflux   ? Complication of anesthesia   ? slow to wake up  ? History of COVID-19   ? Hypertension   ? ?Past Surgical History:  ?Procedure  Laterality Date  ? ABDOMINAL HYSTERECTOMY    ? BIOPSY  11/17/2018  ? Procedure: BIOPSY;  Surgeon: Beverley Fiedler, MD;  Location: [facility] ENDOSCOPY;  Service: Gastroenterology;;  ? BREAST EXCISIONAL BIOPSY Left   ? benign  ? BREAST EXCISIONAL BIOPSY Right   ? benign  ? ESOPHAGOGASTRODUODENOSCOPY (EGD) WITH PROPOFOL N/A 11/17/2018  ? Procedure: ESOPHAGOGASTRODUODENOSCOPY (EGD) WITH PROPOFOL;  Surgeon: Beverley Fiedler, MD;  Location: [facility] ENDOSCOPY;  Service: Gastroenterology;  Laterality: N/A;  ? ? ?Social History:  reports that she quit smoking about 26 years ago. Her smoking use included cigarettes. She started smoking about 46 years ago. She has a 10.00 pack-year smoking history. She has never used smokeless tobacco. She reports that she does not drink alcohol and does not use drugs. ? ? ?Allergies  ?Allergen Reactions  ? Penicillins Other (See Comments)  ?  Childhood allergy  ? ? ?Family History  ?Problem Relation Age of Onset  ? Cancer Father 73  ?     stomach  ? Hypertension Father   ? Diabetes Maternal Grandmother   ? Colon cancer Maternal Grandfather   ? Diabetes Paternal Grandmother   ? Colon cancer Paternal Grandmother   ?  ? ? ?Prior to Admission medications   ?Medication Sig Start Date End Date Taking? Authorizing Provider  ?acetaminophen (TYLENOL) 500 MG tablet Take 1,000 mg by mouth every 6 (six) hours as needed for moderate pain. Reported on 04/21/2016    [provider]  ?amLODipine (NORVASC) 10 MG  tablet Take 1 tablet (10 mg total) by mouth daily. 07/09/17   Lizbeth BarkHairston, Mandesia R, FNP  ?Azilsartan-Chlorthalidone (EDARBYCLOR) 40-25 MG TABS Take 1 tablet by mouth daily.    [provider]  ?hydrochlorothiazide (HYDRODIURIL) 25 MG tablet Take 25 mg by mouth daily. 11/08/21   [provider]  ?Multiple Vitamins-Minerals (CENTRUM SILVER ADULT 50+ PO) Take 1 tablet by mouth daily.    [provider]  ? ? ?Physical Exam: ?BP (!) 148/88 (BP Location: Left Arm)   Pulse 86   Temp 97.7 ?F  (36.5 ?C)   Resp (!) 22   SpO2 95%  ? ?General: 61 y.o. year-old female well developed well nourished in no acute distress.  Alert and oriented x3.  Appears uncomfortable. ?Cardiovascular: Regular rate and rhythm with no rubs or gallops.  No thyromegaly or JVD noted.  Trace lower extremity edema. 2/4 pulses in all 4 extremities. ?Respiratory: Clear to auscultation with no wheezes or rales. Good inspiratory effort. ?Abdomen: Soft nontender nondistended with normal bowel sounds x4 quadrants. ?Muskuloskeletal: No cyanosis or clubbing.  Trace lower extremity edema noted bilaterally ?Neuro: CN II-XII intact, strength, sensation, reflexes ?Skin: No ulcerative lesions noted or rashes ?Psychiatry: Judgement and insight appear normal. Mood is appropriate for condition and setting ?   ?   ?   ?Labs on Admission:  ?Basic Metabolic Panel: ?Recent Labs  ?Lab 02/01/22 ?2336  ?NA 134*  ?K 2.8*  ?CL 90*  ?CO2 32  ?GLUCOSE 117*  ?BUN <5*  ?CREATININE 0.71  ?CALCIUM 9.8  ?MG 2.0  ? ?Liver Function Tests: ?No results for input(s): AST, ALT, ALKPHOS, BILITOT, PROT, ALBUMIN in the last 168 hours. ?No results for input(s): LIPASE, AMYLASE in the last 168 hours. ?No results for input(s): AMMONIA in the last 168 hours. ?CBC: ?Recent Labs  ?Lab 02/01/22 ?2336  ?WBC 5.0  ?HGB 14.6  ?HCT 47.9*  ?MCV 76.6*  ?PLT 317  ? ?Cardiac Enzymes: ?No results for input(s): CKTOTAL, CKMB, CKMBINDEX, TROPONINI in the last 168 hours. ? ?BNP (last 3 results) ?Recent Labs  ?  02/02/22 ?0146  ?BNP 20.2  ? ? ?ProBNP (last 3 results) ?No results for input(s): PROBNP in the last 8760 hours. ? ?CBG: ?No results for input(s): GLUCAP in the last 168 hours. ? ?Radiological Exams on Admission: ?DG Chest 2 View ? ?Result Date: 02/02/2022 ?CLINICAL DATA:  Shortness of breath. EXAM: CHEST - 2 VIEW COMPARISON:  Chest x-ray 12/03/2019. FINDINGS: The heart size and mediastinal contours are within normal limits. Both lungs are clear. The visualized skeletal structures are  unremarkable. IMPRESSION: No active cardiopulmonary disease. Electronically Signed   By: Darliss CheneyAmy  Guttmann M.D.   On: 02/02/2022 00:21   ? ?EKG: I independently viewed the EKG done and my findings are as followed: Sinus tachycardia rate of 102.  Nonspecific ST-T changes.  QTc 435. ? ?Assessment/Plan ?Present on Admission: ? Elevated troponin ? ?Principal Problem: ?  Elevated troponin ? ?Elevated troponin, rule out ACS. ?Presented with vague symptoms, shortness of breath, generalized weakness, nausea. ?High-sensitivity troponin 40 from 35. ?Repeat high-sensitivity troponin ?Serial twelve-lead EKG ?Nonspecific ST-T changes on twelve-lead EKG ?Monitor on telemetry ?Patient received an aspirin in the ED ?EDP started heparin drip ?Continue aspirin 81 mg daily, add Lipitor 40 mg daily. ?Obtain 2D echo, fasting lipid panel ?[facility]ult [facility] in the morning ?N.p.o. except for meds with sips until seen by cardiology ? ?Generalized weakness ?Nausea with no vomiting ?Obtain UA to rule out other causes ?Treat underlying conditions ?PT  OT assessment ?IV antiemetics as needed ? ?Hypokalemia, moderate ?Presented with serum potassium of 2.8 ?Replete intravenously. ?Repeat chemistry panel in the morning, magnesium level 2.0. ?Repeat chemistry panel. ? ?Mild hypovolemic hyponatremia ?Serum sodium 134 ?Start LR KCl 40 mEq at 50 cc/hr x 1 day. ? ?Essential hypertension, BP is not at goal ?Elevated BP ?Resume home Norvasc ?Hold off home diuretics due to hyponatremia and hypokalemia ?Continue to closely monitor vital signs ? ?Severe morbid obesity ?Weight 104 kg with BMI of 43 with reported weight gain ?We will obtain TSH ?Recommend weight loss outpatient with regular physical activity and healthy dieting. ? ?Suspect OSA and possible CO2 retention ?We will obtain VBG to rule out CO2 retention ?Recommend outpatient sleep study ? ? ?Critical care time: 65 minutes. ? ? ?DVT prophylaxis: Heparin drip ? ?Code Status: Full code ? ?Family  Communication: Husband at bedside ? ?Disposition Plan: Admitted to telemetry cardiac unit ? ?Consults called: Please consult cardiology in the morning ? ?Admission status: Observation status ? ? ?Status is: Social research officer, government

## 2022-02-02 NOTE — ED Notes (Signed)
Lab at bedside for TSH ?

## 2022-02-02 NOTE — ED Notes (Signed)
Lab reported able to add on mag and D-dimer to blood already collected in system  ?

## 2022-02-02 NOTE — ED Notes (Signed)
Hospitalist at the bedside to consult pt ?

## 2022-02-02 NOTE — ED Provider Notes (Signed)
?[facility] ?Provider Note ? ? ?CSN: [id number] ?Arrival date & time: 02/01/22  2315 ? ?  ? ?History ? ?Chief Complaint  ?Patient presents with  ? Shortness of Breath  ? ? ?Evalena Shayne AlkenMae Aburto is a 61 y.o. female. ? ?The history is provided by the patient.  ?Shortness of Breath ?She has history of hypertension, bleeding ulcer and comes in because she is feeling bad.  She states that she felt weak through most of the day, and started feeling bad tonight.  By feeling bad, she means that she has had a headache, been short of breath, and had nausea.  She denies chest pain, heaviness, tightness, pressure.  She denies diaphoresis.  Nothing seems to affect her symptoms.  EMS was called and noted oxygen saturation of 88% on room air so she was placed on nasal oxygen and she says she feels better following that.  She denies fever or chills and denies any cough. ?  ?Home Medications ?Prior to Admission medications   ?Medication Sig Start Date End Date Taking? Authorizing Provider  ?acetaminophen (TYLENOL) 500 MG tablet Take 1,000 mg by mouth every 6 (six) hours as needed for moderate pain. Reported on 04/21/2016    [provider]  ?amLODipine (NORVASC) 10 MG tablet Take 1 tablet (10 mg total) by mouth daily. 07/09/17   Lizbeth BarkHairston, Mandesia R, FNP  ?Azilsartan-Chlorthalidone (EDARBYCLOR) 40-25 MG TABS Take 1 tablet by mouth daily.    [provider]  ?hydrochlorothiazide (HYDRODIURIL) 25 MG tablet Take 25 mg by mouth daily. 11/08/21   [provider]  ?Multiple Vitamins-Minerals (CENTRUM SILVER ADULT 50+ PO) Take 1 tablet by mouth daily.    [provider]  ?   ? ?Allergies    ?Penicillins   ? ?Review of Systems   ?Review of Systems  ?Respiratory:  Positive for shortness of breath.   ?All other systems reviewed and are negative. ? ?Physical Exam ?Updated Vital Signs ?BP (!) 159/90   Pulse 80   Temp 97.7 ?F (36.5 ?C)   Resp (!) 22   SpO2 100%  ?Physical  Exam ?Vitals and nursing note reviewed.  ?61 year old female, resting comfortably and in no acute distress. Vital signs are significant for elevated blood pressure and slightly elevated respiratory rate. Oxygen saturation is 100%, which is normal, but obtained while getting supplemental nasal oxygen. ?Head is normocephalic and atraumatic. PERRLA, EOMI. Oropharynx is clear. ?Neck is nontender and supple without adenopathy or JVD. ?Back is nontender and there is no CVA tenderness. ?Lungs have few rales at the left base.  There are no wheezes or rhonchi. ?Chest is nontender. ?Heart has regular rate and rhythm without murmur. ?Abdomen is soft, flat, nontender. ?Extremities have 2+ edema, full range of motion is present. ?Skin is warm and dry without rash. ?Neurologic: Mental status is normal, cranial nerves are intact, moves all extremities equally. ? ?ED Results / Procedures / Treatments   ?Labs ?(all labs ordered are listed, but only abnormal results are displayed) ?Labs Reviewed  ?BASIC METABOLIC PANEL - Abnormal; Notable for the following components:  ?    Result Value  ? Sodium 134 (*)   ? Potassium 2.8 (*)   ? Chloride 90 (*)   ? Glucose, Bld 117 (*)   ? BUN <5 (*)   ? All other components within normal limits  ?CBC - Abnormal; Notable for the following components:  ? RBC 6.25 (*)   ? HCT 47.9 (*)   ?  MCV 76.6 (*)   ? MCH 23.4 (*)   ? RDW 16.3 (*)   ? All other components within normal limits  ?TROPONIN I (HIGH SENSITIVITY) - Abnormal; Notable for the following components:  ? Troponin I (High Sensitivity) 35 (*)   ? All other components within normal limits  ?TROPONIN I (HIGH SENSITIVITY) - Abnormal; Notable for the following components:  ? Troponin I (High Sensitivity) 40 (*)   ? All other components within normal limits  ?BRAIN NATRIURETIC PEPTIDE  ?MAGNESIUM  ?D-DIMER, QUANTITATIVE  ?HEPARIN LEVEL (UNFRACTIONATED)  ?HIV ANTIBODY (ROUTINE TESTING W REFLEX)  ?LIPID PANEL  ? ? ?EKG ?EKG  Interpretation ? ?Date/Time:  Wednesday February 01 2022 23:27:20 EDT ?Ventricular Rate:  102 ?PR Interval:  156 ?QRS Duration: 74 ?QT Interval:  334 ?QTC Calculation: 435 ?R Axis:   -21 ?Text Interpretation: Sinus tachycardia Inferior infarct , age undetermined Anterior infarct , age undetermined Abnormal ECG When compared with ECG of 03-Dec-2019 20:27, No significant change was found Confirmed by Dione Booze ([id number]) on 02/02/2022 1:58:47 AM ? ?Radiology ?DG Chest 2 View ? ?Result Date: 02/02/2022 ?CLINICAL DATA:  Shortness of breath. EXAM: CHEST - 2 VIEW COMPARISON:  Chest x-ray 12/03/2019. FINDINGS: The heart size and mediastinal contours are within normal limits. Both lungs are clear. The visualized skeletal structures are unremarkable. IMPRESSION: No active cardiopulmonary disease. Electronically Signed   By: Darliss Cheney M.D.   On: 02/02/2022 00:21   ? ?Procedures ?Procedures  ?Cardiac monitor shows sinus rhythm, per my interpretation. ? ?Medications Ordered in ED ?Medications  ?prochlorperazine (COMPAZINE) injection 10 mg (has no administration in time range)  ?aspirin chewable tablet 324 mg (has no administration in time range)  ? ? ?ED Course/ Medical Decision Making/ A&P ?  ?                        ?Medical Decision Making ?Amount and/or Complexity of Data Reviewed ?Labs: ordered. ?Radiology: ordered. ? ?Risk ?OTC drugs. ?Prescription drug management. ?Decision regarding hospitalization. ? ? ?Shortness of breath with hypoxia, etiology unclear.  Consider pneumonia, heart failure.  No wheezing or prolonged exhalation phase to suggest COPD.  Also, consider angina equivalent.  Headache of uncertain cause.  Nausea of uncertain cause.  Old records are reviewed, and evaluation by a cardiologist on 11/21/2021 stated no peripheral edema.  Lexiscan stress test on 01/17/2021 was a low risk study. ? ?Chest x-ray shows no acute cardiopulmonary process.  I have independently viewed the images, and agree with radiologist's  interpretation.  Metabolic panel shows severe hypokalemia with potassium 2.8, also mild hyponatremia with sodium 134.  Electrolyte abnormalities likely related to diuretic use.  She is given both oral and intravenous potassium.  We will need to check magnesium level.  CBC is normal.  Troponin is mildly elevated at 35, and repeat has gone higher to 40.  This is concerning for possible ACS.  However, given recent low risk Lexiscan study, will also check D-dimer to look for evidence of pulmonary embolism.  She is given a dose of aspirin. ? ?D-dimer is normal, BNP is normal.  Repeat troponin has risen slightly.  At this point, her symptoms seem likely due to non-STEMI, and she is started on heparin.  Case is discussed with Dr. Margo Aye of [facility] hospital[facility], who agrees to admit the patient. ? ?CRITICAL CARE ?Performed by: Dione Booze ?Total critical care time: 50 minutes ?Critical care time was exclusive of separately billable procedures and treating other patients. ?  Critical care was necessary to treat or prevent imminent or life-threatening deterioration. ?Critical care was time spent personally by me on the following activities: development of treatment plan with patient and/or surrogate as well as nursing, discussions with consultants, evaluation of patient's response to treatment, examination of patient, obtaining history from patient or surrogate, ordering and performing treatments and interventions, ordering and review of laboratory studies, ordering and review of radiographic studies, pulse oximetry and re-evaluation of patient's condition. ? ?Final Clinical Impression(s) / ED Diagnoses ?Final diagnoses:  ?Hypoxia  ?Elevated troponin  ?Diuretic-induced hypokalemia  ?Hyponatremia  ? ? ?Rx / DC Orders ?ED Discharge Orders   ? ? None  ? ?  ? ? ?  ?Dione Booze, MD ?02/02/22 0327 ? ?

## 2022-02-02 NOTE — ED Notes (Signed)
Pt reports headache resolved after IV compazine   ?

## 2022-02-02 NOTE — ED Notes (Signed)
Lab tech at the bedside.

## 2022-02-02 NOTE — ED Notes (Signed)
Dr. Glick at bedside.  

## 2022-02-02 NOTE — ED Notes (Signed)
ED TO INPATIENT HANDOFF REPORT ? ?ED Nurse Name and Phone #: Baxter Flattery, RN ? ?S ?Name/Age/Gender ?Tamara Russell ?61 y.o. ?female ?Room/Bed: 038C/038C ? ?Code Status ?  Code Status: Full Code ? ?Home/SNF/Other ?Home ?Patient oriented to: self, place, time, and situation ?Is this baseline? Yes  ? ?Triage Complete: Triage complete  ?Chief Complaint ?Elevated troponin [R77.8] ?AMS (altered mental status) [R41.82] ? ?Triage Note ?Pt c/o shortness of breath, swelling to BLE and chest fluttering. SpO2 88% on room air, 2LNC applied with improvement.  ? ?Allergies ?Allergies  ?Allergen Reactions  ? Penicillins Other (See Comments)  ?  Childhood allergy. Pt does not remember reaction  ? ? ?Level of Care/Admitting Diagnosis ?ED Disposition   ? ? ED Disposition  ?Admit  ? Condition  ?--  ? Comment  ?Hospital Area: [facility] [id number] ? Level of Care: Telemetry Medical [104] ? May admit patient to [facility] or [facility] if equivalent level of care is available:: Yes ? Covid Evaluation: Asymptomatic - no recent exposure (last 10 days) testing not required ? Diagnosis: AMS (altered mental status) [id number] ? Admitting Physician: Dana Allan I [[id number] ? Attending Physician: Dana Allan I [id number] ? Estimated length of stay: past midnight tomorrow ? Certification:: I certify this patient will need inpatient services for at least 2 midnights ?  ?  ? ?  ? ? ?B ?Medical/Surgery History ?Past Medical History:  ?Diagnosis Date  ? Acid reflux   ? Complication of anesthesia   ? slow to wake up  ? History of COVID-19   ? Hypertension   ? ?Past Surgical History:  ?Procedure Laterality Date  ? ABDOMINAL HYSTERECTOMY    ? BIOPSY  11/17/2018  ? Procedure: BIOPSY;  Surgeon: Jerene Bears, MD;  Location: [facility] ENDOSCOPY;  Service: Gastroenterology;;  ? BREAST EXCISIONAL BIOPSY Left   ? benign  ? BREAST EXCISIONAL BIOPSY Right   ? benign  ? ESOPHAGOGASTRODUODENOSCOPY (EGD) WITH PROPOFOL N/A 11/17/2018  ? Procedure:  ESOPHAGOGASTRODUODENOSCOPY (EGD) WITH PROPOFOL;  Surgeon: Jerene Bears, MD;  Location: [facility] ENDOSCOPY;  Service: Gastroenterology;  Laterality: N/A;  ?  ? ?A ?IV Location/Drains/Wounds ?Patient Lines/Drains/Airways Status   ? ? Active Line/Drains/Airways   ? ? Name Placement date Placement time Site Days  ? Peripheral IV 02/02/22 20 G Right Antecubital 02/02/22  0254  Antecubital  less than 1  ? Peripheral IV 02/02/22 Left Forearm 02/02/22  0339  Forearm  less than 1  ? External Urinary Catheter 02/02/22  1037  --  less than 1  ? ?  ?  ? ?  ? ? ?Intake/Output Last 24 hours ? ?Intake/Output Summary (Last 24 hours) at 02/02/2022 1554 ?Last data filed at 02/02/2022 1504 ?Gross per 24 hour  ?Intake 965.15 ml  ?Output --  ?Net 965.15 ml  ? ? ?Labs/Imaging ?Results for orders placed or performed during the hospital encounter of 02/01/22 (from the past 48 hour(s))  ?Basic metabolic panel     Status: Abnormal  ? Collection Time: 02/01/22 11:36 PM  ?Result Value Ref Range  ? Sodium 134 (L) 135 - 145 mmol/L  ? Potassium 2.8 (L) 3.5 - 5.1 mmol/L  ? Chloride 90 (L) 98 - 111 mmol/L  ? CO2 32 22 - 32 mmol/L  ? Glucose, Bld 117 (H) 70 - 99 mg/dL  ?  Comment: Glucose reference range applies only to samples taken after fasting for at least 8 hours.  ? BUN <5 (L) 8 - 23 mg/dL  ?  Creatinine, Ser 0.71 0.44 - 1.00 mg/dL  ? Calcium 9.8 8.9 - 10.3 mg/dL  ? GFR, Estimated >60 >60 mL/min  ?  Comment: (NOTE) ?Calculated using the CKD-EPI Creatinine Equation (2021) ?  ? Anion gap 12 5 - 15  ?  Comment: Performed at [facility], [place] [address] [place], [place] [address]  ?CBC     Status: Abnormal  ? Collection Time: 02/01/22 11:36 PM  ?Result Value Ref Range  ? WBC 5.0 4.0 - 10.5 K/uL  ? RBC 6.25 (H) 3.87 - 5.11 MIL/uL  ? Hemoglobin 14.6 12.0 - 15.0 g/dL  ? HCT 47.9 (H) 36.0 - 46.0 %  ? MCV 76.6 (L) 80.0 - 100.0 fL  ? MCH 23.4 (L) 26.0 - 34.0 pg  ? MCHC 30.5 30.0 - 36.0 g/dL  ? RDW 16.3 (H) 11.5 - 15.5 %  ? Platelets 317 150 - 400 K/uL  ?  nRBC 0.0 0.0 - 0.2 %  ?  Comment: Performed at [facility], [place] [address], [place], [place] [address]  ?Troponin I (High Sensitivity)     Status: Abnormal  ? Collection Time: 02/01/22 11:36 PM  ?Result Value Ref Range  ? Troponin I (High Sensitivity) 35 (H) <18 ng/L  ?  Comment: (NOTE) ?Elevated high sensitivity troponin I (hsTnI) values and significant  ?changes across serial measurements may suggest ACS but many other  ?chronic and acute conditions are known to elevate hsTnI results.  ?Refer to the Links section for chest pain algorithms and additional  ?guidance. ?Performed at [facility], [place] [address] [place], [place] ?[address] ?  ?Magnesium     Status: None  ? Collection Time: 02/01/22 11:36 PM  ?Result Value Ref Range  ? Magnesium 2.0 1.7 - 2.4 mg/dL  ?  Comment: Performed at [facility], [place] [address], [place], [place] [address]  ?D-dimer, quantitative     Status: None  ? Collection Time: 02/01/22 11:36 PM  ?Result Value Ref Range  ? D-Dimer, Quant <0.27 0.00 - 0.50 ug/mL-FEU  ?  Comment: (NOTE) ?At the manufacturer cut-off value of 0.5 ?g/mL FEU, this assay has a ?negative predictive value of 95-100%.This assay is intended for use ?in conjunction with a clinical pretest probability (PTP) assessment ?model to exclude pulmonary embolism (PE) and deep venous thrombosis ?(DVT) in outpatients suspected of PE or DVT. ?Results should be correlated with clinical presentation. ?Performed at [facility], [place] [address] [place], [place] ?[address] ?  ?Brain natriuretic peptide     Status: None  ? Collection Time: 02/02/22  1:46 AM  ?Result Value Ref Range  ? B Natriuretic Peptide 20.2 0.0 - 100.0 pg/mL  ?  Comment: Performed at [facility], [place] [address], [place], [place] [address]  ?Troponin I (High Sensitivity)     Status: Abnormal  ? Collection Time: 02/02/22  1:46 AM  ?Result Value Ref Range  ? Troponin I (High Sensitivity) 40 (H) <18 ng/L  ?  Comment: (NOTE) ?Elevated  high sensitivity troponin I (hsTnI) values and significant  ?changes across serial measurements may suggest ACS but many other  ?chronic and acute conditions are known to elevate hsTnI results.  ?Refer to the "Links" section for chest pain algorithms and additional  ?guidance. ?Performed at [facility], [place] [address], [place], [place] ?[address] ?  ?HIV Antibody (routine testing w rflx)     Status: None  ? Collection Time: 02/02/22  4:24  AM  ?Result Value Ref Range  ? HIV Screen 4th Generation wRfx Non Reactive Non Reactive  ?  Comment: Performed at [facility], [place] [address] [place], [place] [address]  ?Lipid panel     Status: None  ? Collection Time: 02/02/22  4:24 AM  ?Result Value Ref Range  ? Cholesterol 163 0 - 200 mg/dL  ? Triglycerides 25 <150 mg/dL  ? HDL 78 >40 mg/dL  ? Total CHOL/HDL Ratio 2.1 RATIO  ? VLDL 5 0 - 40 mg/dL  ? LDL Cholesterol 80 0 - 99 mg/dL  ?  Comment:        ?Total Cholesterol/HDL:CHD Risk ?Coronary Heart Disease Risk Table ?                    Men   Women ? 1/2 Average Risk   3.4   3.3 ? Average Risk       5.0   4.4 ? 2 X Average Risk   9.6   7.1 ? 3 X Average Risk  23.4   11.0 ?       ?Use the calculated Patient Ratio ?above and the CHD Risk Table ?to determine the patient's CHD Risk. ?       ?ATP III CLASSIFICATION (LDL): ? <100     mg/dL   Optimal ? 100-129  mg/dL   Near or Above ?                   Optimal ? 130-159  mg/dL   Borderline ? 160-189  mg/dL   High ? >190     mg/dL   Very High ?Performed at [facility], [place] [address] [place], [place] [address] ?  ?CBC     Status: Abnormal  ? Collection Time: 02/02/22  4:24 AM  ?Result Value Ref Range  ? WBC 5.0 4.0 - 10.5 K/uL  ? RBC 6.44 (H) 3.87 - 5.11 MIL/uL  ? Hemoglobin 14.9 12.0 - 15.0 g/dL  ? HCT 49.9 (H) 36.0 - 46.0 %  ? MCV 77.5 (L) 80.0 - 100.0 fL  ? MCH 23.1 (L) 26.0 - 34.0 pg  ? MCHC 29.9 (L) 30.0 - 36.0 g/dL  ? RDW 17.2 (H) 11.5 - 15.5 %  ? Platelets 327 150 - 400 K/uL  ? nRBC 0.0 0.0 - 0.2 %  ?   Comment: Performed at [facility], [place] [address] [place], [place] [address]  ?Basic metabolic panel     Status: Abnormal  ? Collection Time: 02/02/22  4:24 AM  ?Result Value Ref Range  ? Sodium 132 (L) 13

## 2022-02-02 NOTE — Consult Note (Signed)
CARDIOLOGY CONSULT NOTE  ?Patient ID: ?[place] ?MRN: [id number] ?DOB/AGE: 63-13-1962 63 y.o. ? ?Admit date: 02/01/2022 ?Referring Physician  Dr. Anthoney Harada ?Primary Physician:  Lucianne Lei, MD ?Reason for Consultation  congestive heart failure ? ?Patient ID: Tamara Russell, female    DOB: 1961-08-27, 61 y.o.   MRN: [id number] ? ?Chief Complaint  ?Patient presents with  ? Shortness of Breath  ? ?HPI:   ? ?Tamara Russell  is a 61 y.o. African-American female patient with morbid obesity, BMI 43, primary hypertension, former smoker is now referred to [place] for evaluation of shortness of breath.  Patient in usual state of health, until 2 days ago she noticed elevated blood pressure and mild shortness of breath.  On the day of admission, patient's daughter also noticed that she was slightly confused and upon presentation to the ED she was also hypoxemic. ? ?Presently patient is asymptomatic, states that she does take her medications appropriately.  Dyspnea has improved, patient was seen at 6 PM. ? ?No chest pain, has chronic orthopnea and sleeps sitting, no history of obstructive sleep apnea.  Upon questioning, patient has been eating out mostly almost every day and very salty food. ? ?Past Medical History:  ?Diagnosis Date  ? Acid reflux   ? Complication of anesthesia   ? slow to wake up  ? History of COVID-19   ? Hypertension   ? ?Past Surgical History:  ?Procedure Laterality Date  ? ABDOMINAL HYSTERECTOMY    ? BIOPSY  11/17/2018  ? Procedure: BIOPSY;  Surgeon: Jerene Bears, MD;  Location: [facility] ENDOSCOPY;  Service: Gastroenterology;;  ? BREAST EXCISIONAL BIOPSY Left   ? benign  ? BREAST EXCISIONAL BIOPSY Right   ? benign  ? ESOPHAGOGASTRODUODENOSCOPY (EGD) WITH PROPOFOL N/A 11/17/2018  ? Procedure: ESOPHAGOGASTRODUODENOSCOPY (EGD) WITH PROPOFOL;  Surgeon: Jerene Bears, MD;  Location: [facility] ENDOSCOPY;  Service: Gastroenterology;  Laterality: N/A;  ? ?Social History  ? ?Tobacco Use  ? Smoking status: Former   ?  Packs/day: 0.50  ?  Years: 20.00  ?  Pack years: 10.00  ?  Types: Cigarettes  ?  Start date: 04/30/1975  ?  Quit date: 04/30/1995  ?  Years since quitting: 26.7  ? Smokeless tobacco: Never  ?Substance Use Topics  ? Alcohol use: No  ?  Alcohol/week: 0.0 standard drinks  ?  ?Family History  ?Problem Relation Age of Onset  ? Cancer Father 72  ?     stomach  ? Hypertension Father   ? Diabetes Maternal Grandmother   ? Colon cancer Maternal Grandfather   ? Diabetes Paternal Grandmother   ? Colon cancer Paternal Grandmother   ?  ?Marital Status: Married  ?ROS  ?Review of Systems  ?Cardiovascular:  Positive for dyspnea on exertion and leg swelling. Negative for chest pain.  ?Psychiatric/Behavioral:  Positive for altered mental status.   ?Objective  ? ? ?  02/02/2022  ? 10:00 AM 02/02/2022  ?  7:00 AM 02/02/2022  ?  6:00 AM  ?Vitals with BMI  ?Systolic [id number]  ?Diastolic 70 87 92  ?Pulse 76 95 87  ?  ?Blood pressure 111/70, pulse 76, temperature (!) 97.5 ?F (36.4 ?C), temperature source Oral, resp. rate (!) 21, SpO2 98 %.  ?  ?Physical Exam ?Constitutional:   ?   Comments: Morbidly obese in no acute distress.  ?Neck:  ?   Vascular: No JVD.  ?Cardiovascular:  ?   Rate and Rhythm: Normal rate and regular  rhythm.  ?   Pulses: Intact distal pulses.     ?     Carotid pulses are 2+ on the right side and 2+ on the left side. ?   Heart sounds: Normal heart sounds. No murmur heard. ?  No gallop.  ?Pulmonary:  ?   Effort: Pulmonary effort is normal.  ?   Breath sounds: Normal breath sounds.  ?Abdominal:  ?   General: Bowel sounds are normal.  ?   Palpations: Abdomen is soft.  ?   Comments: Obese. Pannus present  ?Musculoskeletal:  ?   Right lower leg: Edema (2+ ankle) present.  ?   Left lower leg: Edema (2+ ankle) present.  ? ?Laboratory examination:  ? ?Recent Labs  ?  02/01/22 ?2336 02/02/22 ?0424  ?NA 134* 132*  ?K 2.8* 3.1*  ?CL 90* 88*  ?CO2 32 28  ?GLUCOSE 117* 108*  ?BUN <5* <5*  ?CREATININE 0.71 0.61  ?CALCIUM 9.8 9.4   ?GFRNONAA >60 >60  ? ?CrCl cannot be calculated (Unknown ideal weight.).  ? ?  Latest Ref Rng & Units 02/02/2022  ?  4:24 AM 02/01/2022  ? 11:36 PM 12/03/2019  ?  8:32 PM  ?CMP  ?Glucose 70 - 99 mg/dL 108   117   109    ?BUN 8 - 23 mg/dL <5   <5   7    ?Creatinine 0.44 - 1.00 mg/dL 0.61   0.71   0.59    ?Sodium 135 - 145 mmol/L 132   134   134    ?Potassium 3.5 - 5.1 mmol/L 3.1   2.8   3.5    ?Chloride 98 - 111 mmol/L 88   90   87    ?CO2 22 - 32 mmol/L 28   32   35    ?Calcium 8.9 - 10.3 mg/dL 9.4   9.8   9.4    ?Total Protein 6.5 - 8.1 g/dL   6.7    ?Total Bilirubin 0.3 - 1.2 mg/dL   0.6    ?Alkaline Phos 38 - 126 U/L   74    ?AST 15 - 41 U/L   22    ?ALT 0 - 44 U/L   16    ? ? ?  Latest Ref Rng & Units 02/02/2022  ?  4:24 AM 02/01/2022  ? 11:36 PM 12/03/2019  ?  8:32 PM  ?CBC  ?WBC 4.0 - 10.5 K/uL 5.0   5.0   5.5    ?Hemoglobin 12.0 - 15.0 g/dL 14.9   14.6   14.1    ?Hematocrit 36.0 - 46.0 % 49.9   47.9   47.1    ?Platelets 150 - 400 K/uL 327   317   279    ? ?Lipid Panel ?Recent Labs  ?  02/02/22 ?0424  ?CHOL 163  ?TRIG 25  ?[place] 80  ?VLDL 5  ?HDL 78  ?CHOLHDL 2.1  ?  ?HEMOGLOBIN A1C ?Lab Results  ?Component Value Date  ? HGBA1C 5.6 04/21/2016  ? MPG 114 04/21/2016  ? ?TSH ?Recent Labs  ?  02/02/22 ?[id number]  ?TSH 1.377  ? ?BNP (last 3 results) ?Recent Labs  ?  02/02/22 ?0146  ?BNP 20.2  ? ? ?Cardiac Panel (last 3 results) ?Recent Labs  ?  02/02/22 ?0146 02/02/22 ?[id number] 02/02/22 ?0836  ?TROPONINIHS 40* 34* 35*  ?  ? ?Urine dipstick shows positive for glucose and positive for leukocytes.  Micro exam: Many+ bacteria.  ? ?Medications and  allergies  ? ?Allergies  ?Allergen Reactions  ? Penicillins Other (See Comments)  ?  Childhood allergy. Pt does not remember reaction  ?  ? ?Current Meds  ?Medication Sig  ? acetaminophen (TYLENOL) 500 MG tablet Take 1,500 mg by mouth every 6 (six) hours as needed for moderate pain. Reported on 04/21/2016  ? amLODipine (NORVASC) 10 MG tablet Take 1 tablet (10 mg total) by mouth daily.  ?  aspirin EC 81 MG tablet Take 81 mg by mouth daily as needed ("overworking heart"). Swallow whole.  ? ELDERBERRY PO Take 1 tablet by mouth daily.  ? hydrochlorothiazide (HYDRODIURIL) 25 MG tablet Take 25 mg by mouth daily.  ? losartan (COZAAR) 100 MG tablet Take 100 mg by mouth daily.  ? Multiple Vitamins-Minerals (CENTRUM SILVER ADULT 50+ PO) Take 1 tablet by mouth daily.  ? ? ?Scheduled Meds: ? amLODipine  10 mg Oral Daily  ? aspirin EC  81 mg Oral Daily  ? atorvastatin  40 mg Oral Daily  ? multivitamin with minerals  1 tablet Oral Daily  ? ?Continuous Infusions: ? heparin 1,200 Units/hr (02/02/22 0342)  ? lactated ringers with kcl 50 mL/hr at 02/02/22 0442  ? ?PRN Meds:.acetaminophen, melatonin, metoprolol tartrate, polyethylene glycol, prochlorperazine  ? ?I/O last 3 completed shifts: ?In: 200 [IV [id number] ?Out: -  ?Total I/O ?In: 100 [IV Piggyback:100] ?Out: -  ?  ? ?Radiology:  ? ?DG Chest 2 View ? ?Result Date: 02/02/2022 ?CLINICAL DATA:  Shortness of breath. EXAM: CHEST - 2 VIEW COMPARISON:  Chest x-ray 12/03/2019. FINDINGS: The heart size and mediastinal contours are within normal limits. Both lungs are clear. The visualized skeletal structures are unremarkable. IMPRESSION: No active cardiopulmonary disease. Electronically Signed   By: Ronney Asters M.D.   On: 02/02/2022 00:21   ? ?Cardiac Studies:  ? ?PCV MYOCARDIAL PERFUSION WITH LEXISCAN 01/17/2022 ? ?Narrative ?Lexiscan (Walking) Tetrofosmin stress test 01/17/2021: ?Walking/Lexiscan nuclear stress test. ?2-day rest/stress protocol. ?Small size, mild intensity perfusion defect, suggestive of possible reversible perfusion defect involving apical anterior and apex (overall accuracy is limited due to soft tissue attenuation BMI 43.6, images taken in seated position).  Without convincing evidence of prior myocardial infarction. ?Calculated LVEF 75%, visually appears hyperdynamic. ?Left ventricular size upper limit of normal, wall thickness preserved, and  significant regional wall motion abnormalities. ?Clinical correlation required. ?No prior studies available for comparison. ?Low risk study. ? ? ?PCV CARDIAC STRESS TEST 12/12/2021 ? ?Narrative ?Exercise t

## 2022-02-02 NOTE — Progress Notes (Signed)
ANTICOAGULATION CONSULT NOTE - Initial Consult ? ?Pharmacy Consult for Heparin ?Indication: chest pain/ACS ? ?Allergies  ?Allergen Reactions  ? Penicillins Other (See Comments)  ?  Childhood allergy  ? ? ?Patient Measurements: ?  ?Heparin Dosing Weight: 75 kg  ? ?Vital Signs: ?Temp: 97.7 ?F (36.5 ?C) (04/19 2318) ?BP: 148/88 (04/20 0307) ?Pulse Rate: 86 (04/20 0307) ? ?Labs: ?Recent Labs  ?  02/01/22 ?2336 02/02/22 ?0146  ?HGB 14.6  --   ?HCT 47.9*  --   ?PLT 317  --   ?CREATININE 0.71  --   ?TROPONINIHS 35* 40*  ? ? ?CrCl cannot be calculated (Unknown ideal weight.). ? ? ?Medical History: ?Past Medical History:  ?Diagnosis Date  ? Acid reflux   ? Complication of anesthesia   ? slow to wake up  ? History of COVID-19   ? Hypertension   ? ? ?Medications:  ?No current facility-administered medications on file prior to encounter.  ? ?Current Outpatient Medications on File Prior to Encounter  ?Medication Sig Dispense Refill  ? acetaminophen (TYLENOL) 500 MG tablet Take 1,000 mg by mouth every 6 (six) hours as needed for moderate pain. Reported on 04/21/2016    ? amLODipine (NORVASC) 10 MG tablet Take 1 tablet (10 mg total) by mouth daily. 90 tablet 1  ? Azilsartan-Chlorthalidone (EDARBYCLOR) 40-25 MG TABS Take 1 tablet by mouth daily.    ? hydrochlorothiazide (HYDRODIURIL) 25 MG tablet Take 25 mg by mouth daily.    ? Multiple Vitamins-Minerals (CENTRUM SILVER ADULT 50+ PO) Take 1 tablet by mouth daily.    ?  ? ?Assessment: ?61 y.o. female with SOB, elevated troponin, possible ACS, for heparin ? ?Goal of Therapy:  ?Heparin level 0.3-0.7 units/ml ?Monitor platelets by anticoagulation protocol: Yes ?  ?Plan:  ?Heparin 4000 units IV bolus, then start heparin 1200 units/hr ?Check heparin level in 6 hours.  ? ?Tamara Russell, [place] [place] ?02/02/2022,3:16 AM ? ? ?

## 2022-02-02 NOTE — ED Notes (Signed)
Pt appears to be sleeping, slight snoring heard, even RR, NAD noted, VS improved. Pt remains on cardiac monitor, husband at the bedside, call bell in reach, safety in place, POC continues.  ?

## 2022-02-02 NOTE — ED Notes (Signed)
Pts daughter at bedside  

## 2022-02-03 ENCOUNTER — Inpatient Hospital Stay (HOSPITAL_COMMUNITY): Payer: 59

## 2022-02-03 DIAGNOSIS — R778 Other specified abnormalities of plasma proteins: Secondary | ICD-10-CM | POA: Diagnosis not present

## 2022-02-03 LAB — CBC
HCT: 44.6 % (ref 36.0–46.0)
Hemoglobin: 13.4 g/dL (ref 12.0–15.0)
MCH: 23.4 pg — ABNORMAL LOW (ref 26.0–34.0)
MCHC: 30 g/dL (ref 30.0–36.0)
MCV: 78 fL — ABNORMAL LOW (ref 80.0–100.0)
Platelets: 262 10*3/uL (ref 150–400)
RBC: 5.72 MIL/uL — ABNORMAL HIGH (ref 3.87–5.11)
RDW: 15.9 % — ABNORMAL HIGH (ref 11.5–15.5)
WBC: 5.4 10*3/uL (ref 4.0–10.5)
nRBC: 0 % (ref 0.0–0.2)

## 2022-02-03 LAB — BASIC METABOLIC PANEL
Anion gap: 4 — ABNORMAL LOW (ref 5–15)
BUN: <5 mg/dL — ABNORMAL LOW (ref 8–23)
CO2: 36 mmol/L — ABNORMAL HIGH (ref 22–32)
Calcium: 9 mg/dL (ref 8.9–10.3)
Chloride: 94 mmol/L — ABNORMAL LOW (ref 98–111)
Creatinine, Ser: 0.57 mg/dL (ref 0.44–1.00)
GFR, Estimated: >60 mL/min (ref >60)
Glucose, Bld: 78 mg/dL (ref 70–99)
Potassium: 3.4 mmol/L — ABNORMAL LOW (ref 3.5–5.1)
Sodium: 134 mmol/L — ABNORMAL LOW (ref 135–145)

## 2022-02-03 LAB — MAGNESIUM: Magnesium: 1.9 mg/dL (ref 1.7–2.4)

## 2022-02-03 LAB — PHOSPHORUS: Phosphorus: 4 mg/dL (ref 2.5–4.6)

## 2022-02-03 MED ORDER — NITROGLYCERIN 0.4 MG SL SUBL
SUBLINGUAL_TABLET | SUBLINGUAL | Status: AC
  Administered 2022-02-03: 0.4 mg
  Filled 2022-02-03: qty 1

## 2022-02-03 MED ORDER — NITROGLYCERIN 0.4 MG SL SUBL: 0.4000 mg | SUBLINGUAL_TABLET | SUBLINGUAL | Status: DC | PRN

## 2022-02-03 MED ORDER — CEFTRIAXONE SODIUM 1 G IJ SOLR
1.0000 g | INTRAMUSCULAR | Status: DC
  Administered 2022-02-03 – 2022-02-05 (×3): 1 g via INTRAVENOUS
  Filled 2022-02-03 (×3): qty 10

## 2022-02-03 MED ORDER — POTASSIUM CHLORIDE CRYS ER 10 MEQ PO TBCR
20.0000 meq | EXTENDED_RELEASE_TABLET | ORAL | Status: AC
  Administered 2022-02-03 (×2): 20 meq via ORAL
  Filled 2022-02-03 (×2): qty 2

## 2022-02-03 MED ORDER — FUROSEMIDE 10 MG/ML IJ SOLN
40.0000 mg | Freq: Two times a day (BID) | INTRAMUSCULAR | Status: DC
Start: 2022-02-03 — End: 2022-02-05
  Administered 2022-02-03 – 2022-02-05 (×5): 40 mg via INTRAVENOUS
  Filled 2022-02-03 (×5): qty 4

## 2022-02-03 NOTE — Evaluation (Signed)
Physical Therapy Evaluation/ discharge ?Patient Details ?Name: Tamara Russell ?MRN: [id number] ?DOB: 05-10-1961 ?Today's Date: 02/03/2022 ? ?History of Present Illness ? 61 yo female adm 4/19 with SOB and elevated troponin. PMhx: obesity, HTN, GERD  ?Clinical Impression ? Pt pleasant with daughter present and reports no falls or difficulty at home. On RA sats dropped to 88% and required 1L to maintain >93% during activity. Pt with cues and education for RW use for safety and increased posture. Pt and daughter educated for safety with RW and continued ambulation as well as monitoring O2 acutely prior to D/C. Pt and daughter able to voice understanding and agreeable to no further therapy needs.  ? ?HR 140 with gait, 98 at rest   ?   ? ?Recommendations for follow up therapy are one component of a multi-disciplinary discharge planning process, led by the attending physician.  Recommendations may be updated based on patient status, additional functional criteria and insurance authorization. ? ?Follow Up Recommendations No PT follow up ? ?  ?Assistance Recommended at Discharge Intermittent Supervision/Assistance  ?Patient can return home with the following ? A little help with walking and/or transfers;Assistance with cooking/housework;A little help with bathing/dressing/bathroom ? ?  ?Equipment Recommendations Rolling walker (2 wheels)  ?Recommendations for Other Services ?    ?  ?Functional Status Assessment Patient has not had a recent decline in their functional status  ? ?  ?Precautions / Restrictions Precautions ?Precautions: Fall;Other (comment) ?Precaution Comments: watch HR and O2  ? ?  ? ?Mobility ? Bed Mobility ?  ?  ?  ?  ?  ?  ?  ?General bed mobility comments: EOb on arrival and to chair end of session ?  ? ?Transfers ?Overall transfer level: Needs assistance ?Equipment used: Rolling walker (2 wheels) ?Transfers: Sit to/from Stand ?Sit to Stand: Supervision ?  ?  ?  ?  ?  ?General transfer comment: cues for  hand placement, pt backing legs to surface but not reaching back despite cues ?  ? ?Ambulation/Gait ?Ambulation/Gait assistance: Supervision ?Gait Distance (Feet): 400 Feet ?Assistive device: Rolling walker (2 wheels) ?Gait Pattern/deviations: Step-through pattern, Decreased stride length, Trunk flexed ?  ?Gait velocity interpretation: 1.31 - 2.62 ft/sec, indicative of limited community ambulator ?  ?General Gait Details: pt with mod cues to step into RW and for safety with change of direction ? ?Stairs ?  ?  ?  ?  ?  ? ?Wheelchair Mobility ?  ? ?Modified Rankin (Stroke Patients Only) ?  ? ?  ? ?Balance Overall balance assessment: Needs assistance ?  ?Sitting balance-Leahy Scale: Good ?  ?  ?Standing balance support: Bilateral upper extremity supported ?Standing balance-Leahy Scale: Fair ?Standing balance comment: can static stand without support, benefits from RW for safety with gait ?  ?  ?  ?  ?  ?  ?  ?  ?  ?  ?  ?   ? ? ? ?Pertinent Vitals/Pain Pain Assessment ?Pain Assessment: No/denies pain  ? ? ?Home Living Family/patient expects to be discharged to:: Private residence ?Living Arrangements: Spouse/significant other;Children ?Available Help at Discharge: Family;Available 24 hours/day ?Type of Home: House ?Home Access: Stairs to enter ?  ?Entrance Stairs-Number of Steps: 2 ?  ?Home Layout: One level ?Home Equipment: None ?   ?  ?Prior Function Prior Level of Function : Independent/Modified Independent ?  ?  ?  ?  ?  ?  ?Mobility Comments: pt reports she cares for herself and leans on shopping cart in stores ?  ?  ? ? ?  Hand Dominance  ?   ? ?  ?Extremity/Trunk Assessment  ? Upper Extremity Assessment ?Upper Extremity Assessment: Generalized weakness ?  ? ?Lower Extremity Assessment ?Lower Extremity Assessment: Generalized weakness ?  ? ?Cervical / Trunk Assessment ?Cervical / Trunk Assessment: Other exceptions ?Cervical / Trunk Exceptions: pt with increased lordosis from body habitus with tendency to push RW  away and assume increased flexion but able to achieve correct posture briefly when cued  ?Communication  ? Communication: No difficulties  ?Cognition Arousal/Alertness: Awake/alert ?Behavior During Therapy: [facility] for tasks assessed/performed ?Overall Cognitive Status: Impaired/Different from baseline ?Area of Impairment: Safety/judgement ?  ?  ?  ?  ?  ?  ?  ?  ?  ?  ?  ?  ?Safety/Judgement: Decreased awareness of deficits ?  ?  ?General Comments: pt oriented and able to follow commands but will quickly move hips posterior to RW despite cues. half way through walk pt asked about visitor restrictions and when told we would need to ask at the desk she stopped walking and demanded an answer until she would move again then started yelling down the hall "Jesus is coming" ?  ?  ? ?  ?General Comments   ? ?  ?Exercises    ? ?Assessment/Plan  ?  ?PT Assessment Patient does not need any further PT services  ?PT Problem List   ? ?   ?  ?PT Treatment Interventions     ? ?PT Goals (Current goals can be found in the Care Plan section)  ?Acute Rehab PT Goals ?PT Goal Formulation: All assessment and education complete, DC therapy ? ?  ?Frequency   ?  ? ? ?Co-evaluation   ?  ?  ?  ?  ? ? ?  ?AM-PAC PT "6 Clicks" Mobility  ?Outcome Measure Help needed turning from your back to your side while in a flat bed without using bedrails?: None ?Help needed moving from lying on your back to sitting on the side of a flat bed without using bedrails?: None ?Help needed moving to and from a bed to a chair (including a wheelchair)?: A Little ?Help needed standing up from a chair using your arms (e.g., wheelchair or bedside chair)?: A Little ?Help needed to walk in hospital room?: A Little ?Help needed climbing 3-5 steps with a railing? : A Little ?6 Click Score: 20 ? ?  ?End of Session   ?Activity Tolerance: Patient tolerated treatment well ?Patient left: in chair;with call bell/phone within reach;with family/visitor present;with chair alarm  set ?Nurse Communication: Mobility status ?PT Visit Diagnosis: Other abnormalities of gait and mobility (R26.89) ?  ? ?Time: 5697-9480 ?PT Time Calculation (min) (ACUTE ONLY): 30 min ? ? ?Charges:   PT Evaluation ?$PT Eval Moderate Complexity: 1 Mod ?PT Treatments ?$Gait Training: 8-22 mins ?  ?   ? ? ?Renaud Celli P, PT ?Acute Rehabilitation Services ?Pager: [phone] ?Office: [phone] ? ? ?Tareek Sabo B Quentez Lober ?02/03/2022, 1:03 PM ? ?

## 2022-02-03 NOTE — Progress Notes (Signed)
Subjective:  ?Feels better this morning, did not have any problems last night sleeping.  Daughter and husband present. ? ?Intake/Output from previous day: ? ?I/O last 3 completed shifts: ?In: 1585.3 [I.V.:1285.3; IV XBMWUXLKG:401] ?Out: 750 [Urine:750] ?No intake/output data recorded. ? ?Blood pressure (!) 154/81, pulse 93, temperature 97.7 ?F (36.5 ?C), temperature source Oral, resp. rate 18, weight 102.1 kg, SpO2 96 %. ?Physical Exam ?Constitutional:   ?   Comments: Morbidly obese in no acute distress.  ?Neck:  ?   Vascular: No JVD.  ?Cardiovascular:  ?   Rate and Rhythm: Normal rate and regular rhythm.  ?   Pulses: Intact distal pulses.     ?     Carotid pulses are 2+ on the right side and 2+ on the left side. ?   Heart sounds: Normal heart sounds. No murmur heard. ?  No gallop.  ?Pulmonary:  ?   Effort: Pulmonary effort is normal.  ?   Breath sounds: Normal breath sounds.  ?Abdominal:  ?   General: Bowel sounds are normal.  ?   Palpations: Abdomen is soft.  ?   Comments: Obese. Pannus present  ?Musculoskeletal:  ?   Right lower leg: Edema (trace) present.  ?   Left lower leg: Edema (trace) present.  ? ? ?Lab Results: ?BMP ?BNP (last 3 results) ?Recent Labs  ?  02/02/22 ?0146  ?BNP 20.2  ? ? ?ProBNP (last 3 results) ?No results for input(s): PROBNP in the last 8760 hours. ? ?  Latest Ref Rng & Units 02/03/2022  ?  2:36 AM 02/02/2022  ?  4:24 AM 02/01/2022  ? 11:36 PM  ?BMP  ?Glucose 70 - 99 mg/dL 78   108   117    ?BUN 8 - 23 mg/dL <5   <5   <5    ?Creatinine 0.44 - 1.00 mg/dL 0.57   0.61   0.71    ?Sodium 135 - 145 mmol/L 134   132   134    ?Potassium 3.5 - 5.1 mmol/L 3.4   3.1   2.8    ?Chloride 98 - 111 mmol/L 94   88   90    ?CO2 22 - 32 mmol/L 36   28   32    ?Calcium 8.9 - 10.3 mg/dL 9.0   9.4   9.8    ? ? ?  Latest Ref Rng & Units 12/03/2019  ?  8:32 PM 11/17/2018  ?  8:35 AM 11/16/2018  ?  1:44 PM  ?Hepatic Function  ?Total Protein 6.5 - 8.1 g/dL 6.7   5.3   5.6    ?Albumin 3.5 - 5.0 g/dL 3.4   2.7   2.8    ?AST  15 - 41 U/L 22   19   27     ?ALT 0 - 44 U/L 16   16   19     ?Alk Phosphatase 38 - 126 U/L 74   42   46    ?Total Bilirubin 0.3 - 1.2 mg/dL 0.6   0.5   0.6    ? ? ?  Latest Ref Rng & Units 02/03/2022  ?  2:36 AM 02/02/2022  ?  4:24 AM 02/01/2022  ? 11:36 PM  ?CBC  ?WBC 4.0 - 10.5 K/uL 5.4   5.0   5.0    ?Hemoglobin 12.0 - 15.0 g/dL 13.4   14.9   14.6    ?Hematocrit 36.0 - 46.0 % 44.6   49.9   47.9    ?  Platelets 150 - 400 K/uL 262   327   317    ? ?Lipid Panel  ?   ?Component Value Date/Time  ? CHOL 163 02/02/2022 0424  ? TRIG 25 02/02/2022 0424  ? HDL 78 02/02/2022 0424  ? CHOLHDL 2.1 02/02/2022 0424  ? VLDL 5 02/02/2022 0424  ? [place] 80 02/02/2022 0424  ? ?Cardiac Panel (last 3 results) ?No results for input(s): CKTOTAL, CKMB, TROPONINI, RELINDX in the last 72 hours. ? ?HEMOGLOBIN A1C ?Lab Results  ?Component Value Date  ? HGBA1C 5.6 04/21/2016  ? MPG 114 04/21/2016  ? ?TSH ?Recent Labs  ?  02/02/22 ?2440  ?TSH 1.377  ? ?Imaging: ? ?DG Chest 2 View ?  ?Result Date: 02/02/2022 ?CLINICAL DATA:  Shortness of breath. EXAM: CHEST - 2 VIEW COMPARISON:  Chest x-ray 12/03/2019. FINDINGS: The heart size and mediastinal contours are within normal limits. Both lungs are clear. The visualized skeletal structures are unremarkable. IMPRESSION: No active cardiopulmonary disease. Electronically Signed   By: Ronney Asters M.D.   On: 02/02/2022 00:21  ? ?Cardiac Studies:  ?  ?PCV MYOCARDIAL PERFUSION WITH LEXISCAN 01/17/2022 ?  ?Narrative ?Lexiscan (Walking) Tetrofosmin stress test 01/17/2021: ?Walking/Lexiscan nuclear stress test. ?2-day rest/stress protocol. ?Small size, mild intensity perfusion defect, suggestive of possible reversible perfusion defect involving apical anterior and apex (overall accuracy is limited due to soft tissue attenuation BMI 43.6, images taken in seated position).  Without convincing evidence of prior myocardial infarction. ?Calculated LVEF 75%, visually appears hyperdynamic. ?Left ventricular size upper  limit of normal, wall thickness preserved, and significant regional wall motion abnormalities. ?Clinical correlation required. ?No prior studies available for comparison. ?Low risk study. ?  ?  ?PCV CARDIAC STRESS TEST 12/12/2021 ?  ?Narrative ?Exercise treadmill stress test 12/07/2021: ?Exercise treadmill stress test performed using Bruce protocol.  Patient reached 2.9 METS, and 84% of age predicted maximum heart rate.  Exercise capacity was very low.  No chest pain reported.  Normal heart rate and hemodynamic response with poor exercise capacity. Stress EKG revealed no ischemic changes. ?While patient nearly reached target heart rate, exercise effort was very poor for adequate ischemic evaluation. Clinical correlation recommended. ?  ?PCV ECHOCARDIOGRAM COMPLETE 12/07/2021 ?  ?Narrative ?Echocardiogram 12/07/2021: ?Study Quality: Technically difficult study. ?Normal LV systolic function with visual EF 60-65%. Left ventricle cavity is normal in size. Normal left ventricular wall thickness. Normal global wall motion. Indeterminate diastolic filling pattern, normal LAP. ?Mild pulmonic regurgitation. ?No prior study for comparison. ?   ?  ?EKG: ?  ?EKG 0/20/2023: Normal sinus rhythm at rate of 76 bpm, normal axis, poor R progression, probably normal variant but cannot exclude anteroseptal infarct old.  Nonspecific T abnormality. No significant change from 11/21/2021. ? ? ?Scheduled Meds: ? amLODipine  10 mg Oral Daily  ? atorvastatin  40 mg Oral Daily  ? furosemide  40 mg Intravenous BID  ? multivitamin with minerals  1 tablet Oral Daily  ? potassium chloride  20 mEq Oral Q1H  ? ?Continuous Infusions: ? 0.9 % NaCl with KCl 20 mEq / L 75 mL/hr at 02/02/22 2028  ? ?PRN Meds:.acetaminophen, melatonin, metoprolol tartrate, polyethylene glycol, prochlorperazine ?  ?Assessment  ?  ?1.  Dyspnea on exertion probably a component of acute on chronic diastolic heart failure. ?2.  Obesity hypoventilation ?3.  Morbid obesity ?4.   Primary hypertension  ?  ?Recommendations:  ?  ?Tamara Russell  is a 61 y.o. African-American female patient with morbid obesity, BMI 43, primary  hypertension, former smoker is now referred to [place] for evaluation of shortness of breath. ?  ?With diuresis, leg edema is completely resolved, she only has trace edema.  Lungs are clear, although chest x-ray findings and BNP are within normal limits, obesity may play a role in diastolic heart failure and normal chest x-ray findings and BNP findings.  So a competent of diastolic heart failure along with obesity hypoventilation is playing a major role in her dyspnea. ? ?Blood pressure is also much improved.  I will start her on losartan in view of both heart failure and hypertension.  I feel she is coming close to being discharged and will need outpatient follow-up.  Again stressed regarding obesity and weight loss. ? ? ?Adrian Prows, MD, [facility] ?02/05/2022, 9:46 AM ?Office: [phone] ?Fax: [phone] ?Pager: [phone]  ?

## 2022-02-03 NOTE — Progress Notes (Signed)
Patient c/o intermittent chest pain that does not radiate. Pain occurs at rest. Patient stated the pain subsides after a few minutes. Patient was given 1 0.4 SL Nitroglycerin. Patient states pain has subsided. Patient's liters of oxygen was increased from 1 Liter to 4 Liters with a saturation of 95. EKG placed in chart. Howerter MD with [facility] notified. ? ?

## 2022-02-03 NOTE — Progress Notes (Signed)
?PROGRESS NOTE ? ? ? ?Tamara Russell  [id number] DOB: 1961-08-25 DOA: 02/01/2022 ?PCP: Lucianne Lei, MD  ?Outpatient Specialists:  ? ? ? ?Brief Narrative:  ?Patient is a 61 year old female with past medical history significant for obesity, essential hypertension, reformed tobacco user (cigarette smoker), COVID-19 infection in 2020.  H&P done earlier was reviewed.  As per H&P, patient presented with shortness of breath, possible angina equivalent.  On arrival to the hospital, O2 sat was 88% on room air, improved to the 90s on 2 L supplemental oxygen via nasal cannula.  Negative D-dimer.  Troponin ranged from 35-40, negative cardiac BNP, nonacute chest x-ray.  Potassium of 2.8 reported. ? ?02/02/2022: Patient seen alongside patient's husband.  Patient could not give any significant history.  History from the patient's daughter was the patient will has been talking out of her head.  The daughter is not a particularly good historian as well.  Discussed case with cardiology team.  UA revealed many bacteria, no leukocyte esterase, and RBCs.  UA also had ketones.  Venous blood done earlier revealed pH of 7.55, PCO2 of 35 and PO2 of 168.  Repeat BMP revealed sodium of 133, potassium of 3.1, chloride of 88, CO2 28 and blood sugar of 198.  BUN is less than 5 with creatinine of 0.61. ? ?02/03/2022: Patient is better today.  Seen alongside patient's husband and 2 children (son and daughter).  Urine culture suggestive of possible UTI.  We will start patient on IV Rocephin.  Patient, awake, alert and responds appropriately.  Encephalopathy has resolved significantly.  Will discontinue IV fluids.  No chest pain reported. ? ? ?Assessment & Plan: ?  ?Principal Problem: ?  Elevated troponin ?Active Problems: ?  AMS (altered mental status) ? ?Elevated troponin, rule out ACS. ?-Troponin is only minimally elevated and not rising. ?-Troponin ranges from 34-40. ?-Cardiology team has already been consulted. ?-Patient is also on  heparin. ?-We defer further care to the cardiology team. ?-No chest pain currently.   ?02/03/2022: Input from the cardiology team is highly appreciated.  Heparin drip has been discontinued ?  ?Altered mental status: ?-Likely metabolic. ?-Repeat ABG. ?-Check ammonia level. ?-CT head. ?-Continue to obtain history from patient and patient's daughter is much as possible. ?-Hydrate patient. ?-Correct hypokalemia. ?-Suspect hyponatremia secondary to volume depletion. ?02/03/2022: See above documentation.  Altered mental status has resolved significantly.  Likely discharge tomorrow.  Consult physical therapy. ?  ?Hypokalemia, moderate ?Presented with serum potassium of 2.8 ?-Etiology unclear.   ?-Suspect secondary to medication.  Patient was HCTZ and chlorthalidone prior to admission ?-Repeat potassium is 3.1. ?-IV fluid normal saline plus potassium. ?-K-Dur 40 mEq p.o. x1 dose. ?-Continue to monitor and replete. ? ?Hyponatremia:  ?-Suspect volume related. ?-Patient was on 2 diuretics prior to admission. ?-Diuretics are currently on hold. ?-Check urine sodium, urine osmolality, serum osmolality and cortisol. ?-TSH is within normal range. ?-Continue to monitor sodium level serum sodium  ?-Last sodium was 132. ?02/03/2022: Sodium is 134 today.  Continue to monitor closely.  Potassium is 3.4.  Will replete. ?  ?Essential hypertension: ?-Currently controlled. ?-Continue to monitor closely.    ? ?Severe morbid obesity ?-Diet and exercise. ?-Consider semaglutide or tirzepatide on discharge if not contraindicated ? ?Suspect OSA and possible CO2 retention ?-Venous blood gas noted. ?-Repeat ABG. ?-Check ammonia level.   ? ?DVT prophylaxis: Heparin drip. ?Code Status: Full code ?Family Communication:  ?Disposition Plan:  ? ? ?Consultants:  ?Cardiology, Dr. Einar Gip. ? ?Procedures:  ?None. ? ?Antimicrobials:  ?  None ? ? ?Subjective: ?Patient has no complaints today. ?No chest pain ?No shortness of breath ?No fever or  chills. ? ?Objective: ?Vitals:  ? 02/03/22 0850 02/03/22 1244 02/03/22 1254 02/03/22 1300  ?BP: (!) 143/84 129/81 129/81 129/81  ?Pulse:  99 98 98  ?Resp:  18 20 20   ?Temp:  98.4 ?F (36.9 ?C) 98.4 ?F (36.9 ?C) 98.4 ?F (36.9 ?C)  ?TempSrc:  Oral    ?SpO2:  90%  93%  ?Weight:      ? ? ?Intake/Output Summary (Last 24 hours) at 02/03/2022 1810 ?Last data filed at 02/03/2022 1716 ?Gross per 24 hour  ?Intake 1520.59 ml  ?Output 3925 ml  ?Net -2404.41 ml  ? ? ?Filed Weights  ? 02/03/22 0500  ?Weight: 102.1 kg  ? ? ?Examination: ? ?General exam: Patient is sleepy.  Not a particularly good historian.  Morbidly obese.  Not in any distress.   ?Respiratory system: Clear to auscultation.  ?Cardiovascular system: S1 & S2  ?Gastrointestinal system: Abdomen is morbidly obese, soft and nontender. Central nervous system: Sleepy.  Seems to move all extremities.   ? ? ?Data Reviewed: I have personally reviewed following labs and imaging studies ? ?CBC: ?Recent Labs  ?Lab 02/01/22 ?2336 02/02/22 ?0424 02/03/22 ?0236  ?WBC 5.0 5.0 5.4  ?HGB 14.6 14.9 13.4  ?HCT 47.9* 49.9* 44.6  ?MCV 76.6* 77.5* 78.0*  ?PLT 317 327 262  ? ? ?Basic Metabolic Panel: ?Recent Labs  ?Lab 02/01/22 ?2336 02/02/22 ?0424 02/03/22 ?0236  ?NA 134* 132* 134*  ?K 2.8* 3.1* 3.4*  ?CL 90* 88* 94*  ?CO2 32 28 36*  ?GLUCOSE 117* 108* 78  ?BUN <5* <5* <5*  ?CREATININE 0.71 0.61 0.57  ?CALCIUM 9.8 9.4 9.0  ?MG 2.0  --  1.9  ?PHOS  --   --  4.0  ? ? ?GFR: ?Estimated Creatinine Clearance: 79 mL/min (by C-G formula based on SCr of 0.57 mg/dL). ?Liver Function Tests: ?No results for input(s): AST, ALT, ALKPHOS, BILITOT, PROT, ALBUMIN in the last 168 hours. ?No results for input(s): LIPASE, AMYLASE in the last 168 hours. ?Recent Labs  ?Lab 02/02/22 ?1841  ?AMMONIA 35  ? ?Coagulation Profile: ?No results for input(s): INR, PROTIME in the last 168 hours. ?Cardiac Enzymes: ?No results for input(s): CKTOTAL, CKMB, CKMBINDEX, TROPONINI in the last 168 hours. ?BNP (last 3 results) ?No  results for input(s): PROBNP in the last 8760 hours. ?HbA1C: ?No results for input(s): HGBA1C in the last 72 hours. ?CBG: ?No results for input(s): GLUCAP in the last 168 hours. ?Lipid Profile: ?Recent Labs  ?  02/02/22 ?0424  ?CHOL 163  ?HDL 78  ?[place] 80  ?TRIG 25  ?CHOLHDL 2.1  ? ? ?Thyroid Function Tests: ?Recent Labs  ?  02/02/22 ?[id number] 02/02/22 ?1841  ?TSH 1.377  --   ?FREET4  --  1.20*  ? ? ?Anemia Panel: ?No results for input(s): VITAMINB12, FOLATE, FERRITIN, TIBC, IRON, RETICCTPCT in the last 72 hours. ?Urine analysis: ?   ?Component Value Date/Time  ? COLORURINE YELLOW 02/02/2022 1100  ? APPEARANCEUR CLEAR 02/02/2022 1100  ? LABSPEC 1.004 (L) 02/02/2022 1100  ? PHURINE 6.0 02/02/2022 1100  ? GLUCOSEU NEGATIVE 02/02/2022 1100  ? HGBUR LARGE (A) 02/02/2022 1100  ? BILIRUBINUR NEGATIVE 02/02/2022 1100  ? KETONESUR 20 (A) 02/02/2022 1100  ? PROTEINUR NEGATIVE 02/02/2022 1100  ? NITRITE NEGATIVE 02/02/2022 1100  ? LEUKOCYTESUR NEGATIVE 02/02/2022 1100  ? ?Sepsis Labs: ?@LABRCNTIP (procalcitonin:4,lacticidven:4) ? ?) ?Recent Results (from the past 240 hour(s))  ?Urine  Culture     Status: Abnormal (Preliminary result)  ? Collection Time: 02/02/22 11:00 AM  ? Specimen: Urine, Clean Catch  ?Result Value Ref Range Status  ? Specimen Description URINE, CLEAN CATCH  Final  ? Special Requests NONE  Final  ? Culture (A)  Final  ?  20,000 COLONIES/mL ESCHERICHIA COLI ?60,000 COLONIES/mL CITROBACTER KOSERI ?SUSCEPTIBILITIES TO FOLLOW ?Performed at [facility], [place] [address], [place], [place] [address] ?  ? Report Status PENDING  Incomplete  ?  ? ? ? ? ? ?Radiology Studies: ?DG Chest 2 View ? ?Result Date: 02/02/2022 ?CLINICAL DATA:  Shortness of breath. EXAM: CHEST - 2 VIEW COMPARISON:  Chest x-ray 12/03/2019. FINDINGS: The heart size and mediastinal contours are within normal limits. Both lungs are clear. The visualized skeletal structures are unremarkable. IMPRESSION: No active cardiopulmonary disease.  Electronically Signed   By: Ronney Asters M.D.   On: 02/02/2022 00:21  ? ?CT HEAD WO CONTRAST (5MM) ? ?Result Date: 02/02/2022 ?CLINICAL DATA:  TIA, headaches EXAM: CT HEAD WITHOUT CONTRAST TECHNIQUE: Contiguous axial images were obtained f

## 2022-02-03 NOTE — Progress Notes (Signed)
OT Cancellation Note ? ?Patient Details ?Name: Tamara Russell ?MRN: [id number] ?DOB: Dec 19, 1960 ? ? ?Cancelled Treatment:    Reason Eval/Treat Not Completed: Patient declined, no reason specified (eating lunch daughter present) pt working on lunch at 3pm with poor intake of plate. Pt encouraged to eat 10 bites of anything on the plate and began to initiate task. Daughter observed feeding patient with fork. Ot to reattempt at next available time.  ? ?Mateo Flow ?02/03/2022, 2:52 PM ?

## 2022-02-03 NOTE — Progress Notes (Signed)
PT Cancellation Note ? ?Patient Details ?Name: Tamara Russell ?MRN: [id number] ?DOB: Apr 17, 1961 ? ? ?Cancelled Treatment:    Reason Eval/Treat Not Completed: Patient declined, no reason specified (pt eating breakfast with assist of daughter and denied mobility at this time) ? ? ?[name] ?02/03/2022, 11:19 AM ?[name], PT ?Acute Rehabilitation Services ?Pager: [phone] ?Office: [phone] ? ?

## 2022-02-04 ENCOUNTER — Inpatient Hospital Stay (HOSPITAL_COMMUNITY): Payer: 59

## 2022-02-04 DIAGNOSIS — R079 Chest pain, unspecified: Secondary | ICD-10-CM | POA: Diagnosis not present

## 2022-02-04 DIAGNOSIS — R778 Other specified abnormalities of plasma proteins: Secondary | ICD-10-CM | POA: Diagnosis not present

## 2022-02-04 LAB — CBC WITH DIFFERENTIAL/PLATELET
Abs Immature Granulocytes: 0.02 10*3/uL (ref 0.00–0.07)
Basophils Absolute: 0 10*3/uL (ref 0.0–0.1)
Basophils Relative: 1 %
Eosinophils Absolute: 0.1 10*3/uL (ref 0.0–0.5)
Eosinophils Relative: 1 %
HCT: 47 % — ABNORMAL HIGH (ref 36.0–46.0)
Hemoglobin: 14.7 g/dL (ref 12.0–15.0)
Immature Granulocytes: 0 %
Lymphocytes Relative: 27 %
Lymphs Abs: 1.2 10*3/uL (ref 0.7–4.0)
MCH: 24.1 pg — ABNORMAL LOW (ref 26.0–34.0)
MCHC: 31.3 g/dL (ref 30.0–36.0)
MCV: 76.9 fL — ABNORMAL LOW (ref 80.0–100.0)
Monocytes Absolute: 0.6 10*3/uL (ref 0.1–1.0)
Monocytes Relative: 12 %
Neutro Abs: 2.8 10*3/uL (ref 1.7–7.7)
Neutrophils Relative %: 59 %
Platelets: 284 10*3/uL (ref 150–400)
RBC: 6.11 MIL/uL — ABNORMAL HIGH (ref 3.87–5.11)
RDW: 16.2 % — ABNORMAL HIGH (ref 11.5–15.5)
WBC: 4.7 10*3/uL (ref 4.0–10.5)
nRBC: 0 % (ref 0.0–0.2)

## 2022-02-04 LAB — TROPONIN I (HIGH SENSITIVITY)
Troponin I (High Sensitivity): 27 ng/L — ABNORMAL HIGH (ref <18)
Troponin I (High Sensitivity): 28 ng/L — ABNORMAL HIGH (ref <18)

## 2022-02-04 LAB — ECHOCARDIOGRAM COMPLETE
AR max vel: 2.54 cm2
AV Area VTI: 2.77 cm2
AV Area mean vel: 2.56 cm2
AV Mean grad: 3.5 mmHg
AV Peak grad: 7.1 mmHg
Ao pk vel: 1.34 m/s
Area-P 1/2: 3.68 cm2
S' Lateral: 2.5 cm
Weight: 3601.43 oz

## 2022-02-04 LAB — URINE CULTURE: Culture: 20000 — AB

## 2022-02-04 LAB — COMPREHENSIVE METABOLIC PANEL
ALT: 17 U/L (ref 0–44)
AST: 18 U/L (ref 15–41)
Albumin: 3.1 g/dL — ABNORMAL LOW (ref 3.5–5.0)
Alkaline Phosphatase: 49 U/L (ref 38–126)
Anion gap: 12 (ref 5–15)
BUN: 6 mg/dL — ABNORMAL LOW (ref 8–23)
CO2: 40 mmol/L — ABNORMAL HIGH (ref 22–32)
Calcium: 9 mg/dL (ref 8.9–10.3)
Chloride: 83 mmol/L — ABNORMAL LOW (ref 98–111)
Creatinine, Ser: 0.78 mg/dL (ref 0.44–1.00)
GFR, Estimated: >60 mL/min (ref >60)
Glucose, Bld: 104 mg/dL — ABNORMAL HIGH (ref 70–99)
Potassium: 3.2 mmol/L — ABNORMAL LOW (ref 3.5–5.1)
Sodium: 135 mmol/L (ref 135–145)
Total Bilirubin: 0.6 mg/dL (ref 0.3–1.2)
Total Protein: 6.2 g/dL — ABNORMAL LOW (ref 6.5–8.1)

## 2022-02-04 LAB — LIPASE, BLOOD: Lipase: 25 U/L (ref 11–51)

## 2022-02-04 LAB — MAGNESIUM: Magnesium: 1.6 mg/dL — ABNORMAL LOW (ref 1.7–2.4)

## 2022-02-04 LAB — GLUCOSE, CAPILLARY: Glucose-Capillary: 104 mg/dL — ABNORMAL HIGH (ref 70–99)

## 2022-02-04 LAB — BRAIN NATRIURETIC PEPTIDE: B Natriuretic Peptide: 20.7 pg/mL (ref 0.0–100.0)

## 2022-02-04 MED ORDER — SODIUM CHLORIDE 0.9 % IV BOLUS: 500.0000 mL | Freq: Once | INTRAVENOUS | Status: DC | PRN

## 2022-02-04 MED ORDER — POTASSIUM CHLORIDE CRYS ER 20 MEQ PO TBCR
40.0000 meq | EXTENDED_RELEASE_TABLET | ORAL | Status: AC
  Administered 2022-02-04 (×2): 40 meq via ORAL
  Filled 2022-02-04 (×2): qty 2

## 2022-02-04 MED ORDER — LOSARTAN POTASSIUM 50 MG PO TABS: 50.0000 mg | ORAL_TABLET | Freq: Every day | ORAL | Status: DC

## 2022-02-04 MED ORDER — ATROPINE SULFATE 1 MG/10ML IJ SOSY: 1.0000 mg | PREFILLED_SYRINGE | Freq: Once | INTRAMUSCULAR | Status: AC

## 2022-02-04 MED ORDER — ATROPINE SULFATE 1 MG/10ML IJ SOSY
PREFILLED_SYRINGE | INTRAMUSCULAR | Status: AC
  Filled 2022-02-04: qty 10

## 2022-02-04 MED ORDER — ATROPINE SULFATE 1 MG/10ML IJ SOSY
PREFILLED_SYRINGE | INTRAMUSCULAR | Status: AC
  Administered 2022-02-04: 0.5 mg
  Filled 2022-02-04: qty 10

## 2022-02-04 NOTE — Progress Notes (Signed)
Echocardiogram ?2D Echocardiogram has been performed. ? ?Tamara Russell ?02/04/2022, 11:10 AM ?

## 2022-02-04 NOTE — Progress Notes (Signed)
PT Cancellation Note ? ?Patient Details ?Name: Tamara Russell ?MRN: [id number] ?DOB: 1961/09/21 ? ? ?Cancelled Treatment:    Reason Eval/Treat Not Completed: Patient declined, no reason specified (daughter present and declined therapy at this time stating pt lightheaded and confused and deferred further therapy asssessment to next date. Daughter unable to state if pt significantly different from prior date and eval) ? ? ?Tamara Russell B Kyden Potash ?02/04/2022, 12:53 PM ?July Nickson P, PT ?Acute Rehabilitation Services ?Pager: [phone] ?Office: [phone] ? ?

## 2022-02-04 NOTE — Evaluation (Signed)
Occupational Therapy Evaluation ?Patient Details ?Name: Tamara Russell ?MRN: [id number] ?DOB: 23-Nov-1960 ?Today's Date: 02/04/2022 ? ? ?History of Present Illness 61 yo female adm 4/19 with SOB and elevated troponin.  Rapid response called about 01:30 for bradycardia in 20x-30s.  PMhx: obesity, HTN, GERD  ? ?Clinical Impression ?  ?Patient is currently requiring assistance with ADLs including Minimal to moderate assist with Lower body ADLs, setup/supervision assist with seated Upper body ADLs,  as well as stand by assist and cues with bed mobility and Min guard assist with functional transfers to toilet.  Current level of function is below patient's typical baseline, particularly cognition and insight/safety awareness.  During this evaluation, patient was limited by generalized weakness, impaired activity tolerance, and impaired cognition with poor insight into impairments, perseveration on tasks but poor attention to topic, all of which has the potential to impact patient's safety and independence during functional mobility, as well as performance for ADLs.  Patient lives her spouse and children,  who are able to provide 24/7 supervision and assistance.  Patient demonstrates fair rehab potential, and should benefit from continued skilled occupational therapy services while in acute care to maximize safety, independence and quality of life at home.  Continued occupational therapy services in the home is recommended for a home safety evaluation. ? ?? ?  ?   ? ?Recommendations for follow up therapy are one component of a multi-disciplinary discharge planning process, led by the attending physician.  Recommendations may be updated based on patient status, additional functional criteria and insurance authorization.  ? ?Follow Up Recommendations ? Home health OT (For home safety Evaluation)  ?  ?Assistance Recommended at Discharge Frequent or constant Supervision/Assistance  ?Patient can return home with the following  A little help with walking and/or transfers;Assistance with cooking/housework;Direct supervision/assist for financial management;Direct supervision/assist for medications management ? ?  ?Functional Status Assessment ? Patient has had a recent decline in their functional status and demonstrates the ability to make significant improvements in function in a reasonable and predictable amount of time.  ?Equipment Recommendations ? Tub/shower seat  ?  ?Recommendations for Other Services   ? ? ?  ?Precautions / Restrictions Precautions ?Precautions: Fall;Other (comment) ?Precaution Comments: watch HR and O2 ?Restrictions ?Weight Bearing Restrictions: No  ? ?  ? ?Mobility Bed Mobility ?Overal bed mobility: Needs Assistance ?Bed Mobility: Supine to Sit ?  ?  ?Supine to sit: Supervision ?  ?  ?General bed mobility comments: Pt required significantly increased time and effort with cues for sequencing to transition from supine to sit. ?  ? ?Transfers ?  ?  ?  ?  ?  ?  ?  ?  ?  ?  ?  ? ?  ?Balance Overall balance assessment: Needs assistance ?  ?Sitting balance-Leahy Scale: Good ?  ?  ?Standing balance support: Bilateral upper extremity supported ?Standing balance-Leahy Scale: Fair ?  ?  ?  ?  ?  ?  ?  ?  ?  ?  ?  ?  ?   ? ?ADL either performed or assessed with clinical judgement  ? ?ADL Overall ADL's : Needs assistance/impaired ?Eating/Feeding: Set up;Cueing for sequencing ?Eating/Feeding Details (indicate cue type and reason): Cues to initiate. Visual and verbal cues needed. ?Grooming: Set up;Cueing for sequencing;Standing;Supervision/safety;Min guard;Wash/dry hands;Wash/dry face;Oral care;Brushing hair ?Grooming Details (indicate cue type and reason): Cues to intiiate. Pt stood at sink, resting forearms on vanity for ~30 min. Pt would take long pauses in between tasks, and stare at  self in mirror. ?Upper Body Bathing: Set up;Sitting;Cueing for sequencing ?  ?Lower Body Bathing: Minimal assistance;Sitting/lateral leans;Sit  to/from stand ?  ?Upper Body Dressing : Minimal assistance;Sitting ?  ?Lower Body Dressing: Set up;Supervision/safety ?Lower Body Dressing Details (indicate cue type and reason): Pt reports that she does not wear socks. Pt slid feet into croc's with setup. ?  ?Statistician Details (indicate cue type and reason): Simulated with pt standing from EOB with Min guard assist, ambulating about 5 steps to sink, standing 30 min then ambulating back to EOB and descending with Min guard for safety. ?Toileting- Architect and Hygiene: Moderate assistance;Cueing for sequencing ?Toileting - Clothing Manipulation Details (indicate cue type and reason): Pt voiding into external catheter. Would anticipate moderate assist for thorough peri care. ?  ?  ?Functional mobility during ADLs: Supervision/safety;Min guard;Cueing for sequencing;Rolling walker (2 wheels) ?General ADL Comments: Pt not using RW safely, often keeping much too far away, or ambulating while resting forearms on handles. When cued to place hands on handled, pt gripped very front bar of walker and required hand over hand cues for safety.  ? ? ? ?Vision   ?Vision Assessment?: No apparent visual deficits  ?   ?Perception   ?  ?Praxis   ?  ? ?Pertinent Vitals/Pain Pain Assessment ?Pain Assessment: No/denies pain  ? ? ? ?Hand Dominance Right ?  ?Extremity/Trunk Assessment Upper Extremity Assessment ?Upper Extremity Assessment: Generalized weakness ?  ?Lower Extremity Assessment ?Lower Extremity Assessment: Generalized weakness ?  ?Cervical / Trunk Assessment ?Cervical / Trunk Assessment: Other exceptions ?Cervical / Trunk Exceptions: pt with increased lordosis from body habitus with tendency to push RW away and assume increased flexion but able to achieve correct posture briefly when cued, then returns to stooped postiion. ?  ?Communication Communication ?Communication: No difficulties ?  ?Cognition Arousal/Alertness: Awake/alert ?Behavior During Therapy: [facility]  for tasks assessed/performed ?Overall Cognitive Status: Impaired/Different from baseline ?Area of Impairment: Safety/judgement, Awareness, Following commands, Attention ?  ?  ?  ?  ?  ?  ?  ?  ?  ?Current Attention Level: Selective ?  ?Following Commands: Follows one step commands with increased time, Follows multi-step commands inconsistently ?Safety/Judgement: Decreased awareness of deficits, Decreased awareness of safety ?Awareness: Emergent ?Problem Solving: Slow processing, Requires verbal cues ?General Comments: Pt stood at sink for ~30 min to perform grooming with long pauses of pt staring at herself in mirror and saying that she senses things with her "cop sense". Pt then stated that she's never been  a cop "but I wish I was". Pt's spouse stated pt was "talking out of her head" yeaterday but pt became agitated with this and denied it.  Requires frequent redirection to task, with poor attention to task/topic. ?  ?  ?General Comments    ? ?  ?Exercises   ?  ?Shoulder Instructions    ? ? ?Home Living Family/patient expects to be discharged to:: Private residence ?Living Arrangements: Spouse/significant other;Children ?Available Help at Discharge: Family;Available 24 hours/day ?Type of Home: House ?Home Access: Stairs to enter ?Entrance Stairs-Number of Steps: 2 ?  ?Home Layout: One level ?  ?  ?Bathroom Shower/Tub: Tub/shower unit ?  ?Bathroom Toilet: Standard (Can push from sink) ?  ?  ?Home Equipment: None ?  ?  ?  ? ?  ?Prior Functioning/Environment Prior Level of Function : Independent/Modified Independent ?  ?  ?  ?  ?  ?  ?Mobility Comments: pt reports she cares for herself and leans on  shopping cart in stores ?  ?  ? ?  ?  ?OT Problem List: Cardiopulmonary status limiting activity;Decreased activity tolerance;Decreased safety awareness;Impaired balance (sitting and/or standing);Decreased knowledge of use of DME or AE;Obesity ?  ?   ?OT Treatment/Interventions: Self-care/ADL training;Therapeutic  exercise;Therapeutic activities;Cognitive remediation/compensation;Energy conservation;Patient/family education;DME and/or AE instruction;Balance training  ?  ?OT Goals(Current goals can be found in the care pl

## 2022-02-04 NOTE — Progress Notes (Signed)
Progress Note ? ?Patient Name: Tamara Russell ?Date of Encounter: 02/04/2022 ? ?Attending physician: Bonnell Public, MD ?Primary care provider: Lucianne Lei, MD ?Primary Cardiologist: Rex Kras, DO, [facility] ? ?Subjective: ?Tamara Russell is a 61 y.o. female who was seen and examined at bedside  ?Daughter and granddaughter at bedside. ?Cardiology was asked to reevaluate the patient as she had episodes of bradycardia early this morning while asleep. ?Denies chest pain. ?Shortness of breath improving. ?Currently being treated for UTI. ?Case discussed and reviewed with her nurse. ? ?Objective: ?Vital Signs in the last 24 hours: ?Temp:  [97.8 ?F (36.6 ?C)-98.2 ?F (36.8 ?C)] 98.2 ?F (36.8 ?C) (04/22 [id number]) ?Pulse Rate:  [66-117] 75 (04/22 1602) ?Resp:  [18-20] 18 (04/22 1602) ?BP: (43-146)/(35-84) 99/66 (04/22 1602) ?SpO2:  [89 %-100 %] 99 % (04/22 1602) ? ?Intake/Output: ?No intake or output data in the 24 hours ending 02/04/22 1942  ?Net IO Since Admission: -3,039.26 mL [02/04/22 1942] ? ?Weights:  ?Filed Weights  ? 02/03/22 0500  ?Weight: 102.1 kg  ? ? ?Telemetry: Personally reviewed.  Predominately normal sinus with episodes of sinus bradycardia earlier this morning ? ?Physical examination: ?PHYSICAL EXAM: ? ?  02/04/2022  ?  4:02 PM 02/04/2022  ? 10:12 AM 02/04/2022  ?  8:42 AM  ?Vitals with BMI  ?Systolic 99 0000000 0000000  ?Diastolic 66 72 78  ?Pulse 75 85 94  ? ? ?General: Alert and oriented x4, appears older than stated age, hemodynamically stable, no acute distress ?HEENT: Normocephalic, atraumatic, no scleral icterus or xanthelasmas, trachea midline no JVP. ?Chest: Equal rise and fall of chest cavity.  No intercostal retractions. ?Lungs: Clear to auscultation bilaterally no wheezes rales or rhonchi's. ?Heart: Regular, positive S1-S2, no murmurs rubs or gallops appreciated. ?Abdomen: Obese, soft, nontender, nondistended, positive bowel sounds in all 4 quadrants, ?Extremities: Warm to touch, no pitting  edema ?Neuro: Alert oriented x4, nonfocal, cranial nerves grossly intact ? ?Lab Results: ?Hematology ?Recent Labs  ?Lab 02/02/22 ?0424 02/03/22 ?0236 02/04/22 ?0209  ?WBC 5.0 5.4 4.7  ?RBC 6.44* 5.72* 6.11*  ?HGB 14.9 13.4 14.7  ?HCT 49.9* 44.6 47.0*  ?MCV 77.5* 78.0* 76.9*  ?MCH 23.1* 23.4* 24.1*  ?MCHC 29.9* 30.0 31.3  ?RDW 17.2* 15.9* 16.2*  ?PLT 327 262 284  ? ? ?Chemistry ?Recent Labs  ?Lab 02/02/22 ?0424 02/03/22 ?0236 02/04/22 ?0209  ?NA 132* 134* 135  ?K 3.1* 3.4* 3.2*  ?CL 88* 94* 83*  ?CO2 28 36* 40*  ?GLUCOSE 108* 78 104*  ?BUN <5* <5* 6*  ?CREATININE 0.61 0.57 0.78  ?CALCIUM 9.4 9.0 9.0  ?PROT  --   --  6.2*  ?ALBUMIN  --   --  3.1*  ?AST  --   --  18  ?ALT  --   --  17  ?ALKPHOS  --   --  49  ?BILITOT  --   --  0.6  ?GFRNONAA >60 >60 >60  ?ANIONGAP 16* 4* 12  ?  ? ?Cardiac Enzymes: ?Cardiac Panel (last 3 results) ?Recent Labs  ?  02/02/22 ?[id number] 02/04/22 ?[id number] 02/04/22 ?0801  ?TROPONINIHS 35* 27* 28*  ? ? ?BNP (last 3 results) ?Recent Labs  ?  02/02/22 ?0146 02/04/22 ?0209  ?BNP 20.2 20.7  ? ? ?ProBNP (last 3 results) ?No results for input(s): PROBNP in the last 8760 hours. ? ? ?DDimer  ?Recent Labs  ?Lab 02/01/22 ?2336  ?DDIMER <0.27  ?  ? ?Hemoglobin A1c:  ?Lab Results  ?Component Value Date  ?  HGBA1C 5.6 04/21/2016  ? MPG 114 04/21/2016  ? ? ?TSH  ?Recent Labs  ?  02/02/22 ?[id number]  ?TSH 1.377  ? ? ?Lipid Panel  ?   ?Component Value Date/Time  ? CHOL 163 02/02/2022 0424  ? TRIG 25 02/02/2022 0424  ? HDL 78 02/02/2022 0424  ? CHOLHDL 2.1 02/02/2022 0424  ? VLDL 5 02/02/2022 0424  ? [place] 80 02/02/2022 0424  ? ? ?Imaging: ?DG CHEST PORT 1 VIEW ? ?Result Date: 02/03/2022 ?CLINICAL DATA:  [id number].  Elevated troponin EXAM: PORTABLE CHEST 1 VIEW COMPARISON:  Cxr 02/02/22. FINDINGS: The heart and mediastinal contours are within normal limits. Prominence of the hilar vasculature bilaterally. No focal consolidation. Slightly increased markings. No pleural effusion. No pneumothorax. No acute osseous abnormality.  IMPRESSION: Mild pulmonary edema. Electronically Signed   By: Iven Finn M.D.   On: 02/03/2022 22:57  ? ?ECHOCARDIOGRAM COMPLETE ? ?Result Date: 02/04/2022 ?   ECHOCARDIOGRAM REPORT   Patient Name:   Tamara Russell Date of Exam: 02/04/2022 Medical Rec #:  [id number]           Height:       61.0 in Accession #:    [id number]          Weight:       225.1 lb Date of Birth:  07-13-1961           BSA:          1.986 m? Patient Age:    27 years            BP:           101/71 mmHg Patient Gender: F                   HR:           85 bpm. Exam Location:  Inpatient Procedure: 2D Echo Indications:    Chest pain  History:        Patient has no prior history of Echocardiogram examinations.                 Risk Factors:Hypertension.  Sonographer:    Arlyss Gandy Referring Phys: [id number] Rhetta Mura  Sonographer Comments: Image acquisition challenging due to patient body habitus and supine. IMPRESSIONS  1. Left ventricular ejection fraction, by estimation, is 60 to 65%. The left ventricle has normal function. The left ventricle has no regional wall motion abnormalities. Left ventricular diastolic parameters are consistent with Grade I diastolic dysfunction (impaired relaxation).  2. Right ventricular systolic function is normal. The right ventricular size is normal. Tricuspid regurgitation signal is inadequate for assessing PA pressure.  3. The mitral valve is grossly normal. No evidence of mitral valve regurgitation.  4. The aortic valve is tricuspid. Aortic valve regurgitation is not visualized.  5. The inferior vena cava is normal in size with greater than 50% respiratory variability, suggesting right atrial pressure of 3 mmHg. Comparison(s): No prior Echocardiogram. FINDINGS  Left Ventricle: Left ventricular ejection fraction, by estimation, is 60 to 65%. The left ventricle has normal function. The left ventricle has no regional wall motion abnormalities. The left ventricular internal cavity size was normal in  size. There is  no left ventricular hypertrophy. Left ventricular diastolic parameters are consistent with Grade I diastolic dysfunction (impaired relaxation). Indeterminate filling pressures. Right Ventricle: The right ventricular size is normal. No increase in right ventricular wall thickness. Right ventricular systolic function is normal. Tricuspid regurgitation signal is inadequate for assessing PA  pressure. Left Atrium: Left atrial size was normal in size. Right Atrium: Right atrial size was normal in size. Pericardium: There is no evidence of pericardial effusion. Mitral Valve: The mitral valve is grossly normal. No evidence of mitral valve regurgitation. Tricuspid Valve: The tricuspid valve is grossly normal. Tricuspid valve regurgitation is trivial. Aortic Valve: The aortic valve is tricuspid. Aortic valve regurgitation is not visualized. Aortic valve mean gradient measures 3.5 mmHg. Aortic valve peak gradient measures 7.1 mmHg. Aortic valve area, by VTI measures 2.77 cm?. Pulmonic Valve: The pulmonic valve was grossly normal. Pulmonic valve regurgitation is mild. Aorta: The aortic root and ascending aorta are structurally normal, with no evidence of dilitation. Venous: The inferior vena cava is normal in size with greater than 50% respiratory variability, suggesting right atrial pressure of 3 mmHg. IAS/Shunts: No atrial level shunt detected by color flow Doppler.  LEFT VENTRICLE PLAX 2D LVIDd:         3.70 cm   Diastology LVIDs:         2.50 cm   LV e' medial:    5.77 cm/s LV PW:         0.90 cm   LV E/e' medial:  10.9 LV IVS:        0.90 cm   LV e' lateral:   12.30 cm/s LVOT diam:     2.00 cm   LV E/e' lateral: 5.1 LV SV:         67 LV SV Index:   34 LVOT Area:     3.14 cm?  RIGHT VENTRICLE             IVC RV Basal diam:  2.40 cm     IVC diam: 1.80 cm RV Mid diam:    2.50 cm RV S prime:     16.40 cm/s TAPSE (M-mode): 2.1 cm LEFT ATRIUM             Index        RIGHT ATRIUM           Index LA diam:         3.50 cm 1.76 cm/m?   RA Area:     12.60 cm? LA Vol (A2C):   36.6 ml 18.43 ml/m?  RA Volume:   24.70 ml  12.44 ml/m? LA Vol (A4C):   48.2 ml 24.27 ml/m? LA Biplane Vol: 42.3 ml 21.30 ml/m?  AORTIC VALVE

## 2022-02-04 NOTE — Progress Notes (Signed)
TRH night cross cover note: ? ?I was notified by RN that the patient was complaining new-onset intermittent chest pain, which she also reported as abdominal discomfort without any associated symptoms.  This is reportedly new for this hospitalization.  Patient subsequently received a single sublingual nitroglycerin, following which her chest pain has resolved, and patient has subsequently remained chest pain-free.  Of note, the patient who is here with acute hypoxic respiratory distress and had been maintaining oxygen saturations in the low 90s on 1 L nasal cannula throughout the dayshift of 02/03/22, subsequently needed 2 L nasal cannula in order to maintain O2 sats greater than 90%.  This is relative to no known baseline supplemental oxygen requirements as an outpatient.  At the time of the patient's chest discomfort, blood pressure noted to be 146/80.  ? ?Ordered stat EKG, which showed sinus rhythm with heart rate 92, nonspecific T wave inversion in leads V1 through V3, of which V2 and V3 appears slightly more pronounced relative to most recent prior EKG from 02/02/2022, and no evidence of ST changes, including no evidence of ST elevation.  Chest x-ray showed mild edema in the context of documented suspicion for acute heart failure with preserved EF, for which she has been receiving diuresis during this hospitalization but otherwise chest x-ray showed no evidence of acute process.  Of note, D-dimer performed during this hospitalization was nonelevated. ? ?Per chart review, the patient recently undergone outpatient stress test approximately 3 weeks ago, which was felt to be a low risk study. ? ?Subsequently, the patient experienced an episode of bradycardia, with heart rates into the mid 40s.  Associated with some hypotension, with heart rate/hypotension resolving following single dose of atropine and 500 cc IVF bolus.  Patient noted to be lethargic at the time of this bradycardia, which also resolved back to  baseline mental status with ensuing improvement in heart rate/resolution of mild hypotension.  EKG repeated again, and appears unchanged relative to previous EKGs, including assessment of inferior leads given the patient's bradycardia.  Also ordered stat troponin, which is trending down relative to most recent prior values in the 30s.  Also ordered echocardiogram, stat CMP, CBC, serum magnesium level, BNP, lipase.  TSH was checked earlier during this hospitalization, and found to be within normal limits.  Will also obtain high-sensitivity troponin I value at 8 AM this morning to further trend given new development of chest pain earlier during this night shift.  Of note, no residual chest discomfort associated with this bradycardic episode.  Given mild, transient hypotension, I have also held her losartan and amlodipine for now. ? ?Of note, the patient is not on any scheduled AV nodal blocking agents, and most recent beta-blocker dose received was that of Lopressor 2.5 mg IV on the afternoon of 02/02/2022. ? ?Cardiology, Dr Nadara Eaton, consulted during his hospitalization, feeling that patient's initial presentation for sob, acute hypoxic respiratory distress, is unlikely to be ACS, but rather suspicious for implications of obesity hypoventilation syndrome, without recommendation for additional ischemic evaluation at this time.  ? ? ? ? ?Newton Pigg, DO ?Hospitalist ? ?

## 2022-02-04 NOTE — Progress Notes (Signed)
?   02/04/22 0130  ?Assess: MEWS Score  ?BP  ?(pulse weak and thready)  ?ECG Heart Rate (!) 35  ?Level of Consciousness Unresponsive  ?SpO2 96 %  ?O2 Device Nasal Cannula  ?O2 Flow Rate (L/min) 4 L/min  ?Assess: MEWS Score  ?MEWS Temp 0  ?MEWS Systolic 0  ?MEWS Pulse 2  ?MEWS RR 0  ?MEWS LOC 3  ?MEWS Score 5  ?MEWS Score Color Red  ?Assess: if the MEWS score is Yellow or Red  ?Were vital signs taken at a resting state? Yes  ?Focused Assessment Change from prior assessment (see assessment flowsheet)  ?Early Detection of Sepsis Score *See Row Information* High  ?MEWS guidelines implemented *See Row Information* Yes  ?Treat  ?MEWS Interventions Escalated (See documentation below)  ?Pain Scale 0-10  ?Pain Score 0  ?Take Vital Signs  ?Increase Vital Sign Frequency  Red: Q 1hr X 4 then Q 4hr X 4, if remains red, continue Q 4hrs  ?Escalate  ?MEWS: Escalate Red: discuss with charge nurse/RN and provider, consider discussing with RRT  ?Notify: Charge Nurse/RN  ?Name of Charge Nurse/RN Notified [place] RN  ?Date Charge Nurse/RN Notified 02/04/22  ?Time Charge Nurse/RN Notified 0130  ?Notify: Provider  ?Provider Name/Title Babs Bertin MD  ?Date Provider Notified 02/04/22  ?Time Provider Notified 0145  ?Notification Type Page  ?Notification Reason Change in status  ?Provider response See new orders  ?Date of Provider Response 02/04/22  ?Time of Provider Response [phone]  ?Notify: Rapid Response  ?Name of Rapid Response RN Notified [place] RN  ?Date Rapid Response Notified 02/04/22  ?Time Rapid Response Notified 0130  ?Document  ?Patient Outcome Stabilized after interventions  ?Progress note created (see row info) Yes  ? ? ?

## 2022-02-04 NOTE — Progress Notes (Signed)
?PROGRESS NOTE ? ? ? ?Tamara Russell  [id number] DOB: [birth year] DOA: 02/01/2022 ?PCP: Lucianne Lei, MD  ?Outpatient Specialists:  ? ? ? ?Brief Narrative:  ?Patient is a 61 year old female with past medical history significant for obesity, essential hypertension, reformed tobacco user (cigarette smoker), COVID-19 infection in 2020.  H&P done earlier was reviewed.  As per H&P, patient presented with shortness of breath, possible angina equivalent.  On arrival to the hospital, O2 sat was 88% on room air, improved to the 90s on 2 L supplemental oxygen via nasal cannula.  Negative D-dimer.  Troponin ranged from 35-40, negative cardiac BNP, nonacute chest x-ray.  Potassium of 2.8 reported. ? ?02/02/2022: Patient seen alongside patient's husband.  Patient could not give any significant history.  History from the patient's daughter was the patient will has been talking out of her head.  The daughter is not a particularly good historian as well.  Discussed case with cardiology team.  UA revealed many bacteria, no leukocyte esterase, and RBCs.  UA also had ketones.  Venous blood done earlier revealed pH of 7.55, PCO2 of 35 and PO2 of 168.  Repeat BMP revealed sodium of 133, potassium of 3.1, chloride of 88, CO2 28 and blood sugar of 198.  BUN is less than 5 with creatinine of 0.61. ? ?02/03/2022: Patient is better today.  Seen alongside patient's husband and 2 children (son and daughter).  Urine culture suggestive of possible UTI.  We will start patient on IV Rocephin.  Patient, awake, alert and responds appropriately.  Encephalopathy has resolved significantly.  Will discontinue IV fluids.  No chest pain reported. ? ?02/04/2022: Patient seen.  Events of last night noted.  Patient was noted to have become significantly bradycardic.  Heart rate was said to have dropped to the 30s.  Discussed with the cardiology team extensively, patient will be monitored overnight today.  Cardiology thinks this may be related to  undiagnosed OSA/OHS.  Patient will need sleep study on discharge.  Patient will likely benefit from CPAP therapy.  Urine culture grew E. coli and Citrobacter koseri.  We will continue IV Rocephin for now.  Patient can be discharged in oral Keflex tomorrow to complete course of antibiotics. ? ? ?Assessment & Plan: ?  ?Principal Problem: ?  Elevated troponin ?Active Problems: ?  AMS (altered mental status) ? ?Elevated troponin, rule out ACS. ?-Troponin is only minimally elevated and not rising. ?-Troponin ranges from 34-40. ?-Cardiology team has already been consulted. ?-Patient is also on heparin. ?-We defer further care to the cardiology team. ?-No chest pain currently.   ?02/03/2022: Input from the cardiology team is highly appreciated.  Heparin drip has been discontinued ?02/04/2022: No chest pain.  No shortness of breath.  No symptoms some signs suggestive of acute coronary syndrome. ?  ?Altered mental status/combined toxic and metabolic encephalopathy: ?-Likely metabolic. ?-Repeat ABG. ?-Check ammonia level. ?-CT head. ?-Continue to obtain history from patient and patient's daughter is much as possible. ?-Hydrate patient. ?-Correct hypokalemia. ?-Suspect hyponatremia secondary to volume depletion. ?02/03/2022: See above documentation.  Altered mental status has resolved significantly.  Likely discharge tomorrow.  Consult physical therapy. ?02/04/2022: Resolved significantly. ? ?UTI secondary to E. coli and Citrobacter koseri: ?-Continue IV Rocephin. ?-Likely discharge back: Oral Keflex. ?  ?Hypokalemia, moderate ?Presented with serum potassium of 2.8 ?-Etiology unclear.   ?-Suspect secondary to medication.  Patient was HCTZ and chlorthalidone prior to admission ?-Repeat potassium is 3.1. ?-IV fluid normal saline plus potassium. ?-K-Dur 40 mEq p.o. x1 dose. ?-  Continue to monitor and replete. ?02/04/2022: Potassium is 3.2 today.  Continue to monitor and replete.  Cardiology team intends to start patient on  Aldactone. ? ?Hyponatremia:  ?-Suspect volume related. ?-Patient was on 2 diuretics prior to admission. ?-Diuretics are currently on hold. ?-Check urine sodium, urine osmolality, serum osmolality and cortisol. ?-TSH is within normal range. ?-Continue to monitor sodium level serum sodium  ?-Last sodium was 132. ?02/03/2022: Sodium is 134 today.  Continue to monitor closely.  Potassium is 3.4. ?02/04/2022: Sodium is 135 today.. ?  ?Essential hypertension: ?-Currently controlled. ?-Continue to monitor closely.    ? ?Severe morbid obesity ?-Diet and exercise. ?-Consider semaglutide or tirzepatide on discharge if not contraindicated ? ?Suspect OSA and possible CO2 retention ?-Venous blood gas noted. ?-Repeat ABG. ?-Check ammonia level.   ? ?DVT prophylaxis: Heparin drip. ?Code Status: Full code ?Family Communication:  ?Disposition Plan:  ? ? ?Consultants:  ?Cardiology, Dr. Einar Gip. ? ?Procedures:  ?None. ? ?Antimicrobials:  ?None ? ? ?Subjective: ?Patient has no complaints today. ?No chest pain ?No shortness of breath ?No fever or chills. ? ?Objective: ?Vitals:  ? 02/04/22 0836 02/04/22 0841 02/04/22 0842 02/04/22 1012  ?BP: 135/80 125/78 125/78 128/72  ?Pulse: 93 92 94 85  ?Resp:    20  ?Temp: 98.2 ?F (36.8 ?C)     ?TempSrc: Oral     ?SpO2: 96% 98% 98% 100%  ?Weight:      ? ? ?Intake/Output Summary (Last 24 hours) at 02/04/2022 1532 ?Last data filed at 02/03/2022 1818 ?Gross per 24 hour  ?Intake 900.48 ml  ?Output 1200 ml  ?Net -299.52 ml  ? ? ?Filed Weights  ? 02/03/22 0500  ?Weight: 102.1 kg  ? ? ?Examination: ? ?General exam: Patient is awake and alert.  Not in any distress.  Patient is morbidly obese.     ?Respiratory system: Clear to auscultation.  ?Cardiovascular system: S1 & S2  ?Gastrointestinal system: Abdomen is morbidly obese, soft and nontender. Central nervous system: Sleepy.  Seems to move all extremities.   ? ? ?Data Reviewed: I have personally reviewed following labs and imaging studies ? ?CBC: ?Recent Labs   ?Lab 02/01/22 ?2336 02/02/22 ?0424 02/03/22 ?0236 02/04/22 ?0209  ?WBC 5.0 5.0 5.4 4.7  ?NEUTROABS  --   --   --  2.8  ?HGB 14.6 14.9 13.4 14.7  ?HCT 47.9* 49.9* 44.6 47.0*  ?MCV 76.6* 77.5* 78.0* 76.9*  ?PLT 317 327 262 284  ? ? ?Basic Metabolic Panel: ?Recent Labs  ?Lab 02/01/22 ?2336 02/02/22 ?0424 02/03/22 ?0236 02/04/22 ?0209  ?NA 134* 132* 134* 135  ?K 2.8* 3.1* 3.4* 3.2*  ?CL 90* 88* 94* 83*  ?CO2 32 28 36* 40*  ?GLUCOSE 117* 108* 78 104*  ?BUN <5* <5* <5* 6*  ?CREATININE 0.71 0.61 0.57 0.78  ?CALCIUM 9.8 9.4 9.0 9.0  ?MG 2.0  --  1.9 1.6*  ?PHOS  --   --  4.0  --   ? ? ?GFR: ?Estimated Creatinine Clearance: 79 mL/min (by C-G formula based on SCr of 0.78 mg/dL). ?Liver Function Tests: ?Recent Labs  ?Lab 02/04/22 ?0209  ?AST 18  ?ALT 17  ?ALKPHOS 49  ?BILITOT 0.6  ?PROT 6.2*  ?ALBUMIN 3.1*  ? ?Recent Labs  ?Lab 02/04/22 ?0209  ?LIPASE 25  ? ?Recent Labs  ?Lab 02/02/22 ?1841  ?AMMONIA 35  ? ? ?Coagulation Profile: ?No results for input(s): INR, PROTIME in the last 168 hours. ?Cardiac Enzymes: ?No results for input(s): CKTOTAL, CKMB, CKMBINDEX, TROPONINI in the  last 168 hours. ?BNP (last 3 results) ?No results for input(s): PROBNP in the last 8760 hours. ?HbA1C: ?No results for input(s): HGBA1C in the last 72 hours. ?CBG: ?Recent Labs  ?Lab 02/04/22 ?0134  ?GLUCAP 104*  ? ?Lipid Profile: ?Recent Labs  ?  02/02/22 ?0424  ?CHOL 163  ?HDL 78  ?[place] 80  ?TRIG 25  ?CHOLHDL 2.1  ? ? ?Thyroid Function Tests: ?Recent Labs  ?  02/02/22 ?[id number] 02/02/22 ?1841  ?TSH 1.377  --   ?FREET4  --  1.20*  ? ? ?Anemia Panel: ?No results for input(s): VITAMINB12, FOLATE, FERRITIN, TIBC, IRON, RETICCTPCT in the last 72 hours. ?Urine analysis: ?   ?Component Value Date/Time  ? COLORURINE YELLOW 02/02/2022 1100  ? APPEARANCEUR CLEAR 02/02/2022 1100  ? LABSPEC 1.004 (L) 02/02/2022 1100  ? PHURINE 6.0 02/02/2022 1100  ? GLUCOSEU NEGATIVE 02/02/2022 1100  ? HGBUR LARGE (A) 02/02/2022 1100  ? BILIRUBINUR NEGATIVE 02/02/2022 1100  ?  KETONESUR 20 (A) 02/02/2022 1100  ? PROTEINUR NEGATIVE 02/02/2022 1100  ? NITRITE NEGATIVE 02/02/2022 1100  ? LEUKOCYTESUR NEGATIVE 02/02/2022 1100  ? ?Sepsis Labs: ?@[email or web address] (procalcitonin:4,lacticidven:4) ? ?) ?Recent Results

## 2022-02-04 NOTE — Progress Notes (Addendum)
Patient HR declined to 28  with a 3 second pause. Upon entering the room, the patient responded to voice. Her HR was 35, pulse was weak and thready, skin was cool, clammy and diaphoretic, and unable to retrieve a BP. A minute later the patient became unresponsive to voice and touch. O2 saturation 96 on 2L [place]. Rapid was called. Atropine was given. Without any other measure, patient's BP is 130/80 and HR is 70. Patient is not at baseline but is alert and oriented x1-2. Babs Bertin MD notified. Patient is awake with husband at the bedside.  ?

## 2022-02-04 NOTE — Significant Event (Addendum)
Rapid Response Event Note  ? ?Reason for Call :  ?Bradycardia-30s ? ?Initial Focused Assessment:  ?Pt lying in bed with eyes closed. She will open her eyes to repeated painful stimuli, answer simple questions, and follow simple commands. Pt goes back to sleep easily with no stimulation. Lungs diminished t/o. Skin cool/clammy/diaphoretic. ? ?HR-45, BP-43/35, RR-18, SpO2-96% on 2L [place]. ? ?Interventions:  ?1amp atropine-HR increased to 80s, SBP-100s ?500cc bolus(held because BP came up after atropine) ?CBG-104 ?EKG-NSR ?IVT consult for new PIV. ?CMP, Mg, CBCD, Trop, BNP ?Echo ?Plan of Care:  ?Pt better after atropine(HR-92, SBP-100s, pt back to baseline mental status). Atropine at bedside. Hold antihypertensives. TRH MD to reach out to cardiology. Await lab results and cards input. Continue to monitor pt closely. Call RRT if further assistance needed.  ? ?Event Summary:  ? ?MD Notified: Dr. Arlean Hopping notified and is reaching out to cards for input. ?Call Time:0131 ?Arrival OINO:[id number] ?End MCNO:[id number] ? ?Terrilyn Saver, RN ?

## 2022-02-05 DIAGNOSIS — R4182 Altered mental status, unspecified: Secondary | ICD-10-CM | POA: Diagnosis not present

## 2022-02-05 DIAGNOSIS — I455 Other specified heart block: Secondary | ICD-10-CM

## 2022-02-05 DIAGNOSIS — N390 Urinary tract infection, site not specified: Secondary | ICD-10-CM

## 2022-02-05 DIAGNOSIS — E876 Hypokalemia: Secondary | ICD-10-CM

## 2022-02-05 DIAGNOSIS — R778 Other specified abnormalities of plasma proteins: Secondary | ICD-10-CM

## 2022-02-05 DIAGNOSIS — I5032 Chronic diastolic (congestive) heart failure: Secondary | ICD-10-CM

## 2022-02-05 DIAGNOSIS — R7989 Other specified abnormal findings of blood chemistry: Secondary | ICD-10-CM

## 2022-02-05 DIAGNOSIS — R001 Bradycardia, unspecified: Secondary | ICD-10-CM

## 2022-02-05 DIAGNOSIS — I10 Essential (primary) hypertension: Secondary | ICD-10-CM | POA: Diagnosis not present

## 2022-02-05 DIAGNOSIS — K922 Gastrointestinal hemorrhage, unspecified: Secondary | ICD-10-CM | POA: Diagnosis not present

## 2022-02-05 DIAGNOSIS — R000 Tachycardia, unspecified: Secondary | ICD-10-CM

## 2022-02-05 DIAGNOSIS — Z87440 Personal history of urinary (tract) infections: Secondary | ICD-10-CM

## 2022-02-05 MED ORDER — ATORVASTATIN CALCIUM 40 MG PO TABS: 40.0000 mg | ORAL_TABLET | Freq: Every day | ORAL | 0 refills | Status: DC

## 2022-02-05 MED ORDER — METOPROLOL SUCCINATE ER 25 MG PO TB24: 12.5000 mg | ORAL_TABLET | Freq: Every morning | ORAL | Status: DC

## 2022-02-05 MED ORDER — CEPHALEXIN 500 MG PO CAPS: 500.0000 mg | ORAL_CAPSULE | Freq: Four times a day (QID) | ORAL | 0 refills | Status: DC

## 2022-02-05 MED ORDER — METOPROLOL SUCCINATE ER 25 MG PO TB24: 12.5000 mg | ORAL_TABLET | Freq: Every morning | ORAL | 0 refills | Status: DC

## 2022-02-05 MED ORDER — SPIRONOLACTONE 25 MG PO TABS
25.0000 mg | ORAL_TABLET | Freq: Every day | ORAL | 0 refills | Status: DC
Start: 2022-02-05 — End: 2022-03-17

## 2022-02-05 MED ORDER — ACETAMINOPHEN 500 MG PO TABS: 500.0000 mg | ORAL_TABLET | Freq: Four times a day (QID) | ORAL | 0 refills | Status: DC | PRN

## 2022-02-05 NOTE — Progress Notes (Signed)
PT Cancellation Note ? ?Patient Details ?Name: Tamara Russell ?MRN: [id number] ?DOB: 1960/12/30 ? ? ?Cancelled Treatment:    Reason Eval/Treat Not Completed: Patient declined, no reason specified. Pt eating upon PT arrival, according to family recently finished walking. PT will attempt to follow up as time allows. ? ? ?Arlyss Gandy ?02/05/2022, 5:19 PM ?

## 2022-02-05 NOTE — TOC Transition Note (Addendum)
Transition of Care (TOC) - CM/SW Discharge Note ? ? ?Patient Details  ?Name: Tamara Russell ?MRN: [id number] ?Date of Birth: October 30, 1960 ? ?Transition of Care (TOC) CM/SW Contact:  ?Lawerance Sabal, RN ?Phone Number: ?02/05/2022, 2:57 PM ? ? ?Clinical Narrative:   Spoke w patient's daughter over the phone. Explained that she will go home with home oxygen. [employer] has been called and it will be delivered to the room prior to DC ?Attempting to set up [facility] services, barriers are commercial plan therapy needed.  ?Discussed that [facility] may not be available, patient will have transport to OP office if needed, although HH is preferred, if unavailable daughter agreeable to outpatient services.  ?15:11 MD aware via secure chat that oxygen order needs completed in order for DME company to process it.  ?Unable to secure [facility] services. Spoke w daughter and agreeable to referral to [facility] for OP OT. Referral placed electronically and added to AVS ? ? ? ?Final next level of care: Home w Home Health Services ?Barriers to Discharge: No Barriers Identified ? ? ?Patient Goals and CMS Choice ?Patient states their goals for this hospitalization and ongoing recovery are:: to go home ?CMS Medicare.gov Compare Post Acute Care list provided to:: Other (Comment Required) ?Choice offered to / list presented to : Adult Children ? ?Discharge Placement ?  ?           ?  ?  ?  ?  ? ?Discharge Plan and Services ?  ?  ?           ?DME Arranged: Oxygen ?DME Agency: [employer] ?Date DME Agency Contacted: 02/05/22 ?Time DME Agency Contacted: 1455 ?Representative spoke with at DME Agency: Leavy Cella ?HH Arranged: OT ?  ?  ?  ?  ? ?Social Determinants of Health (SDOH) Interventions ?  ? ? ?Readmission Risk Interventions ?   ? View : No data to display.  ?  ?  ?  ? ? ? ? ? ?

## 2022-02-05 NOTE — Progress Notes (Signed)
Progress Note ? ?Patient Name: Tamara Russell ?Date of Encounter: 02/05/2022 ? ?Attending physician: No att. providers found ?Primary Russell provider: [name], MD ?Primary Cardiologist: Tessa Lerner, DO, [facility] ? ?Subjective: ?Tamara Russell is a 61 y.o. female who was seen and examined at bedside  ?Patient sitting upright accompanied by family members. ?Tentative plan is to discharge later today. ?Denies chest pain, near-syncope, syncope. ?Shortness of breath improving. ?Urinary tract infection symptoms improving on current antibiotics. ?Case discussed and reviewed with her nurse. ? ?Objective: ?Vital Signs in the last 24 hours: ?Temp:  [98.3 ?F (36.8 ?C)] 98.3 ?F (36.8 ?C) (04/23 1420) ?Pulse Rate:  [77-110] 107 (04/23 1439) ?Resp:  [18-20] 18 (04/23 1439) ?BP: (78-156)/(47-87) 103/74 (04/23 1439) ?SpO2:  [93 %-100 %] 100 % (04/23 1439) ?Weight:  [97.8 kg] 97.8 kg (04/23 0523) ? ?Intake/Output: ? ?Intake/Output Summary (Last 24 hours) at 02/05/2022 1853 ?Last data filed at 02/05/2022 1219 ?Gross per 24 hour  ?Intake 340 ml  ?Output 2850 ml  ?Net -2510 ml  ?  ?Net IO Since Admission: -5,549.26 mL [02/05/22 1853] ? ?Weights:  ?Filed Weights  ? 02/03/22 0500 02/05/22 0523  ?Weight: 102.1 kg 97.8 kg  ? ? ?Telemetry: Personally reviewed.  Predominately sinus versus sinus tachycardia. ? ?Physical examination: ?PHYSICAL EXAM: ? ?  02/05/2022  ?  2:39 PM 02/05/2022  ?  2:20 PM 02/05/2022  ?  1:57 PM  ?Vitals with BMI  ?Systolic 103 78   ?Diastolic 74 47   ?Pulse 107 110 100  ? ? ?General: Alert and oriented x4, appears older than stated age, hemodynamically stable, no acute distress ?HEENT: Normocephalic, atraumatic, no scleral icterus or xanthelasmas, trachea midline no JVP. ?Chest: Equal rise and fall of chest cavity.  No intercostal retractions. ?Lungs: Clear to auscultation bilaterally no wheezes rales or rhonchi's. ?Heart: Regular, tachycardic, positive S1-S2, no murmurs rubs or gallops appreciated. ?Abdomen:  Obese, soft, nontender, nondistended, positive bowel sounds in all 4 quadrants, ?Extremities: Warm to touch, no pitting edema ?Neuro: Alert oriented x4, nonfocal, cranial nerves grossly intact ? ?Lab Results: ?Hematology ?Recent Labs  ?Lab 02/02/22 ?0424 02/03/22 ?0236 02/04/22 ?0209  ?WBC 5.0 5.4 4.7  ?RBC 6.44* 5.72* 6.11*  ?HGB 14.9 13.4 14.7  ?HCT 49.9* 44.6 47.0*  ?MCV 77.5* 78.0* 76.9*  ?MCH 23.1* 23.4* 24.1*  ?MCHC 29.9* 30.0 31.3  ?RDW 17.2* 15.9* 16.2*  ?PLT 327 262 284  ? ? ?Chemistry ?Recent Labs  ?Lab 02/02/22 ?0424 02/03/22 ?0236 02/04/22 ?0209  ?NA 132* 134* 135  ?K 3.1* 3.4* 3.2*  ?CL 88* 94* 83*  ?CO2 28 36* 40*  ?GLUCOSE 108* 78 104*  ?BUN <5* <5* 6*  ?CREATININE 0.61 0.57 0.78  ?CALCIUM 9.4 9.0 9.0  ?PROT  --   --  6.2*  ?ALBUMIN  --   --  3.1*  ?AST  --   --  18  ?ALT  --   --  17  ?ALKPHOS  --   --  49  ?BILITOT  --   --  0.6  ?GFRNONAA >60 >60 >60  ?ANIONGAP 16* 4* 12  ?  ? ?Cardiac Enzymes: ?Cardiac Panel (last 3 results) ?Recent Labs  ?  02/04/22 ?0209 02/04/22 ?0801  ?TROPONINIHS 27* 28*  ? ? ?BNP (last 3 results) ?Recent Labs  ?  02/02/22 ?0146 02/04/22 ?0209  ?BNP 20.2 20.7  ? ? ?ProBNP (last 3 results) ?No results for input(s): PROBNP in the last 8760 hours. ? ? ?DDimer  ?Recent Labs  ?Lab 02/01/22 ?  2336  ?DDIMER <0.27  ?  ? ?Hemoglobin A1c:  ?Lab Results  ?Component Value Date  ? HGBA1C 5.6 04/21/2016  ? MPG 114 04/21/2016  ? ? ?TSH  ?Recent Labs  ?  02/02/22 ?[id number]  ?TSH 1.377  ? ? ?Lipid Panel  ?   ?Component Value Date/Time  ? CHOL 163 02/02/2022 0424  ? TRIG 25 02/02/2022 0424  ? HDL 78 02/02/2022 0424  ? CHOLHDL 2.1 02/02/2022 0424  ? VLDL 5 02/02/2022 0424  ? LDLCALC 80 02/02/2022 0424  ? ? ?Imaging: ?DG CHEST PORT 1 VIEW ? ?Result Date: 02/03/2022 ?CLINICAL DATA:  [id number].  Elevated troponin EXAM: PORTABLE CHEST 1 VIEW COMPARISON:  Cxr 02/02/22. FINDINGS: The heart and mediastinal contours are within normal limits. Prominence of the hilar vasculature bilaterally. No focal  consolidation. Slightly increased markings. No pleural effusion. No pneumothorax. No acute osseous abnormality. IMPRESSION: Mild pulmonary edema. Electronically Signed   By: Tish FredericksonMorgane  Naveau M.D.   On: 02/03/2022 22:57  ? ?ECHOCARDIOGRAM COMPLETE ? ?Result Date: 02/04/2022 ?   ECHOCARDIOGRAM REPORT   Patient Name:   Tamara CareHATTIE MAE Russell Date of Exam: 02/04/2022 Medical Rec #:  [id number]           Height:       61.0 in Accession #:    [id number]          Weight:       225.1 lb Date of Birth:  1961/07/06           BSA:          1.986 m? Patient Age:    61 years            BP:           101/71 mmHg Patient Gender: F                   HR:           85 bpm. Exam Location:  Inpatient Procedure: 2D Echo Indications:    Chest pain  History:        Patient has no prior history of Echocardiogram examinations.                 Risk Factors:Hypertension.  Sonographer:    Devonne DoughtyStacey Plante Referring Phys: [id number] Angie FavaJUSTIN B HOWERTER  Sonographer Comments: Image acquisition challenging due to patient body habitus and supine. IMPRESSIONS  1. Left ventricular ejection fraction, by estimation, is 60 to 65%. The left ventricle has normal function. The left ventricle has no regional wall motion abnormalities. Left ventricular diastolic parameters are consistent with Grade I diastolic dysfunction (impaired relaxation).  2. Right ventricular systolic function is normal. The right ventricular size is normal. Tricuspid regurgitation signal is inadequate for assessing PA pressure.  3. The mitral valve is grossly normal. No evidence of mitral valve regurgitation.  4. The aortic valve is tricuspid. Aortic valve regurgitation is not visualized.  5. The inferior vena cava is normal in size with greater than 50% respiratory variability, suggesting right atrial pressure of 3 mmHg. Comparison(s): No prior Echocardiogram. FINDINGS  Left Ventricle: Left ventricular ejection fraction, by estimation, is 60 to 65%. The left ventricle has normal function. The left  ventricle has no regional wall motion abnormalities. The left ventricular internal cavity size was normal in size. There is  no left ventricular hypertrophy. Left ventricular diastolic parameters are consistent with Grade I diastolic dysfunction (impaired relaxation). Indeterminate filling pressures. Right Ventricle: The right ventricular size is normal. No increase  in right ventricular wall thickness. Right ventricular systolic function is normal. Tricuspid regurgitation signal is inadequate for assessing PA pressure. Left Atrium: Left atrial size was normal in size. Right Atrium: Right atrial size was normal in size. Pericardium: There is no evidence of pericardial effusion. Mitral Valve: The mitral valve is grossly normal. No evidence of mitral valve regurgitation. Tricuspid Valve: The tricuspid valve is grossly normal. Tricuspid valve regurgitation is trivial. Aortic Valve: The aortic valve is tricuspid. Aortic valve regurgitation is not visualized. Aortic valve mean gradient measures 3.5 mmHg. Aortic valve peak gradient measures 7.1 mmHg. Aortic valve area, by VTI measures 2.77 cm?. Pulmonic Valve: The pulmonic valve was grossly normal. Pulmonic valve regurgitation is mild. Aorta: The aortic root and ascending aorta are structurally normal, with no evidence of dilitation. Venous: The inferior vena cava is normal in size with greater than 50% respiratory variability, suggesting right atrial pressure of 3 mmHg. IAS/Shunts: No atrial level shunt detected by color flow Doppler.  LEFT VENTRICLE PLAX 2D LVIDd:         3.70 cm   Diastology LVIDs:         2.50 cm   LV e' medial:    5.77 cm/s LV PW:         0.90 cm   LV E/e' medial:  10.9 LV IVS:        0.90 cm   LV e' lateral:   12.30 cm/s LVOT diam:     2.00 cm   LV E/e' lateral: 5.1 LV SV:         67 LV SV Index:   34 LVOT Area:     3.14 cm?  RIGHT VENTRICLE             IVC RV Basal diam:  2.40 cm     IVC diam: 1.80 cm RV Mid diam:    2.50 cm RV S prime:     16.40  cm/s TAPSE (M-mode): 2.1 cm LEFT ATRIUM             Index        RIGHT ATRIUM           Index LA diam:        3.50 cm 1.76 cm/m?   RA Area:     12.60 cm? LA Vol (A2C):   36.6 ml 18.43 ml/m?  RA Volume:   24.70 ml  12.

## 2022-02-05 NOTE — Plan of Care (Signed)

## 2022-02-05 NOTE — Discharge Summary (Signed)
?Physician Discharge Summary ?  ?Patient: Tamara Russell MRN: [id number] DOB: 05/23/1961  ?Admit date:     02/01/2022  ?Discharge date: 02/05/22  ?Discharge Physician: Barnetta Chapel  ? ?PCP: [name], MD  ? ?Recommendations at discharge:  ?-Follow-up with your primary care provider and cardiology team within 1 week of discharge. ?-Outpatient sleep studies. ?-Complete course of antibiotics. ? ?Discharge Diagnoses: ?Principal Problem: ?  Elevated troponin ?Active Problems: ?  AMS (altered mental status) ? ?Resolved Problems: ?  * No resolved hospital problems. * ? ?Hospital Course: ?Patient is a 61 year old female with past medical history significant for obesity, essential hypertension, reformed tobacco user (cigarette smoker), COVID-19 infection in 2020.  H&P done earlier was reviewed.  As per H&P, patient presented with shortness of breath, possible angina equivalent.  On arrival to the hospital, O2 sat was 88% on room air, improved to the 90s on 2 L supplemental oxygen via nasal cannula.  Negative D-dimer.  Troponin ranged from 35-40, negative cardiac BNP, nonacute chest x-ray.  Potassium of 2.8 reported. ?  ?02/02/2022: Patient seen alongside patient's husband.  Patient could not give any significant history.  History from the patient's daughter was the patient will has been talking out of her head.  The daughter is not a particularly good historian as well.  Discussed case with cardiology team.  UA revealed many bacteria, no leukocyte esterase, and RBCs.  UA also had ketones.  Venous blood done earlier revealed pH of 7.55, PCO2 of 35 and PO2 of 168.  Repeat BMP revealed sodium of 133, potassium of 3.1, chloride of 88, CO2 28 and blood sugar of 198.  BUN is less than 5 with creatinine of 0.61. ? ?02/03/2022: Patient is better today.  Seen alongside patient's husband and 2 children (son and daughter).  Urine culture suggestive of possible UTI.  We will start patient on IV Rocephin.  Patient, awake, alert  and responds appropriately.  Encephalopathy has resolved significantly.  Will discontinue IV fluids.  No chest pain reported. ? ?02/04/2022: Patient seen.  Events of last night noted.  Patient was noted to have become significantly bradycardic.  Heart rate was said to have dropped to the 30s.  Discussed with the cardiology team extensively, patient will be monitored overnight today.  Cardiology thinks this may be related to undiagnosed OSA/OHS.  Patient will need sleep study on discharge.  Patient will likely benefit from CPAP therapy.  Urine culture grew E. coli and Citrobacter koseri.  We will continue IV Rocephin for now.  Patient can be discharged in oral Keflex tomorrow to complete course of antibiotics. ? ?02/05/2022: Patient seen alongside patient's husband.  Patient has continued to do well.  No further bradycardia noted.  Patient will be discharged back home today on oral antibiotics.  Patient will need outpatient sleep studies. ? ?Assessment and Plan: ?Elevated troponin, rule out ACS. ?-Troponin is only minimally elevated and not rising. ?-Troponin ranges from 34-40. ?-Cardiology team has already been consulted. ?-Patient is also on heparin. ?-We defer further care to the cardiology team. ?-No chest pain currently.   ?02/03/2022: Input from the cardiology team is highly appreciated.  Heparin drip has been discontinued ?02/04/2022: No chest pain.  No shortness of breath.  No symptoms some signs suggestive of acute coronary syndrome. ?  ?Altered mental status/combined toxic and metabolic encephalopathy: ?-Likely metabolic. ?-Repeat ABG. ?-Check ammonia level. ?-CT head. ?-Continue to obtain history from patient and patient's daughter is much as possible. ?-Hydrate patient. ?-Correct hypokalemia. ?-Suspect  hyponatremia secondary to volume depletion. ?02/03/2022: See above documentation.  Altered mental status has resolved significantly.  Likely discharge tomorrow.  Consult physical therapy. ?02/04/2022: Resolved  significantly. ? ?UTI secondary to E. coli and Citrobacter koseri: ?-Continue IV Rocephin. ?-Likely discharge back: Oral Keflex. ?  ?Hypokalemia, moderate ?Presented with serum potassium of 2.8 ?-Etiology unclear.   ?-Suspect secondary to medication.  Patient was HCTZ and chlorthalidone prior to admission ?-Repeat potassium is 3.1. ?-IV fluid normal saline plus potassium. ?-K-Dur 40 mEq p.o. x1 dose. ?-Continue to monitor and replete. ?02/04/2022: Potassium is 3.2 today.  Continue to monitor and replete.  Cardiology team intends to start patient on Aldactone. ?  ?Hyponatremia:  ?-Suspect volume related. ?-Patient was on 2 diuretics prior to admission. ?-Diuretics are currently on hold. ?-Check urine sodium, urine osmolality, serum osmolality and cortisol. ?-TSH is within normal range. ?-Continue to monitor sodium level serum sodium  ?-Last sodium was 132. ?02/03/2022: Sodium is 134 today.  Continue to monitor closely.  Potassium is 3.4. ?02/04/2022: Sodium is 135 today.. ?  ?Essential hypertension: ?-Currently controlled. ?-Continue to monitor closely.    ?  ?Severe morbid obesity ?-Diet and exercise. ?-Consider semaglutide or tirzepatide on discharge if not contraindicated ?  ?Suspect OSA and possible CO2 retention ?-Venous blood gas noted. ?-Repeat ABG. ? ?Consultants: Cardiology ?Procedures performed: None ?Disposition: Home with home oxygen ?Diet recommendation:  ?Discharge Diet Orders (From admission, onward)  ? ?  Start     Ordered  ? 02/05/22 0000  Diet - low sodium heart healthy       ? 02/05/22 1424  ? ?  ?  ? ?  ? ?Cardiac diet ?DISCHARGE MEDICATION: ?Allergies as of 02/05/2022   ? ?   Reactions  ? Penicillins Other (See Comments)  ? Childhood allergy. Pt does not remember reaction  ? ?  ? ?  ?Medication List  ?  ? ?STOP taking these medications   ? ?amLODipine 10 MG tablet ?Commonly known as: NORVASC ?  ?Edarbyclor 40-25 MG Tabs ?Generic drug: Azilsartan-Chlorthalidone ?  ?ELDERBERRY PO ?   ?hydrochlorothiazide 25 MG tablet ?Commonly known as: HYDRODIURIL ?  ?losartan 100 MG tablet ?Commonly known as: COZAAR ?  ? ?  ? ?TAKE these medications   ? ?acetaminophen 500 MG tablet ?Commonly known as: TYLENOL ?Take 1 tablet (500 mg total) by mouth every 6 (six) hours as needed for moderate pain. Reported on 04/21/2016 ?What changed: how much to take ?  ?aspirin EC 81 MG tablet ?Take 81 mg by mouth daily as needed ("overworking heart"). Swallow whole. ?  ?atorvastatin 40 MG tablet ?Commonly known as: LIPITOR ?Take 1 tablet (40 mg total) by mouth daily. ?Start taking on: February 06, 2022 ?  ?CENTRUM SILVER ADULT 50+ PO ?Take 1 tablet by mouth daily. ?  ?cephALEXin 500 MG capsule ?Commonly known as: KEFLEX ?Take 1 capsule (500 mg total) by mouth 4 (four) times daily for 4 days. ?  ?spironolactone 25 MG tablet ?Commonly known as: Aldactone ?Take 1 tablet (25 mg total) by mouth daily. ?  ? ?  ? ?  ?  ? ? ?  ?Durable Medical Equipment  ?(From admission, onward)  ?  ? ? ?  ? ?  Start     Ordered  ? 02/05/22 1424  DME Oxygen  Once       ?Question Answer Comment  ?Length of Need 6 Months   ?Oxygen delivery system Gas   ?  ? 02/05/22 1424  ? ?  ?  ? ?  ? ? ?  Discharge Exam: ?Filed Weights  ? 02/03/22 0500 02/05/22 0523  ?Weight: 102.1 kg 97.8 kg  ? ? ? ?Condition at discharge: stable ? ?The results of significant diagnostics from this hospitalization (including imaging, microbiology, ancillary and laboratory) are listed below for reference.  ? ?Imaging Studies: ?DG Chest 2 View ? ?Result Date: 02/02/2022 ?CLINICAL DATA:  Shortness of breath. EXAM: CHEST - 2 VIEW COMPARISON:  Chest x-ray 12/03/2019. FINDINGS: The heart size and mediastinal contours are within normal limits. Both lungs are clear. The visualized skeletal structures are unremarkable. IMPRESSION: No active cardiopulmonary disease. Electronically Signed   By: Darliss CheneyAmy  Guttmann M.D.   On: 02/02/2022 00:21  ? ?CT HEAD WO CONTRAST (5MM) ? ?Result Date:  02/02/2022 ?CLINICAL DATA:  TIA, headaches EXAM: CT HEAD WITHOUT CONTRAST TECHNIQUE: Contiguous axial images were obtained from the base of the skull through the vertex without intravenous contrast. RADIATION DOSE REDUCTION: This exam was p

## 2022-02-05 NOTE — Progress Notes (Signed)
SATURATION QUALIFICATIONS: (This note is used to comply with regulatory documentation for home oxygen)  Patient Saturations on Room Air at Rest = 93%  Patient Saturations on Room Air while Ambulating = 84%  Patient Saturations on 2 Liters of oxygen while Ambulating = 94%  Please briefly explain why patient needs home oxygen: 

## 2022-02-06 ENCOUNTER — Encounter (HOSPITAL_COMMUNITY): Payer: Self-pay | Admitting: Emergency Medicine

## 2022-02-06 ENCOUNTER — Other Ambulatory Visit: Payer: Self-pay

## 2022-02-06 ENCOUNTER — Emergency Department (HOSPITAL_COMMUNITY): Payer: 59

## 2022-02-06 ENCOUNTER — Inpatient Hospital Stay (HOSPITAL_COMMUNITY)
Admission: EM | Admit: 2022-02-06 | Discharge: 2022-03-17 | DRG: 070 | Disposition: A | Discharge status: Skilled Nursing Facility | Payer: 59 | Attending: Internal Medicine | Admitting: Internal Medicine

## 2022-02-06 DIAGNOSIS — R42 Dizziness and giddiness: Secondary | ICD-10-CM | POA: Diagnosis not present

## 2022-02-06 DIAGNOSIS — D751 Secondary polycythemia: Secondary | ICD-10-CM | POA: Diagnosis present

## 2022-02-06 DIAGNOSIS — R1310 Dysphagia, unspecified: Secondary | ICD-10-CM | POA: Diagnosis not present

## 2022-02-06 DIAGNOSIS — K567 Ileus, unspecified: Secondary | ICD-10-CM | POA: Diagnosis present

## 2022-02-06 DIAGNOSIS — R9431 Abnormal electrocardiogram [ECG] [EKG]: Secondary | ICD-10-CM | POA: Diagnosis not present

## 2022-02-06 DIAGNOSIS — R4182 Altered mental status, unspecified: Secondary | ICD-10-CM | POA: Diagnosis not present

## 2022-02-06 DIAGNOSIS — R34 Anuria and oliguria: Secondary | ICD-10-CM | POA: Diagnosis not present

## 2022-02-06 DIAGNOSIS — J9622 Acute and chronic respiratory failure with hypercapnia: Secondary | ICD-10-CM | POA: Diagnosis present

## 2022-02-06 DIAGNOSIS — R001 Bradycardia, unspecified: Secondary | ICD-10-CM | POA: Diagnosis not present

## 2022-02-06 DIAGNOSIS — I110 Hypertensive heart disease with heart failure: Secondary | ICD-10-CM | POA: Diagnosis present

## 2022-02-06 DIAGNOSIS — B961 Klebsiella pneumoniae [K. pneumoniae] as the cause of diseases classified elsewhere: Secondary | ICD-10-CM | POA: Diagnosis present

## 2022-02-06 DIAGNOSIS — Z560 Unemployment, unspecified: Secondary | ICD-10-CM

## 2022-02-06 DIAGNOSIS — J9602 Acute respiratory failure with hypercapnia: Secondary | ICD-10-CM | POA: Diagnosis present

## 2022-02-06 DIAGNOSIS — Z5329 Procedure and treatment not carried out because of patient's decision for other reasons: Secondary | ICD-10-CM | POA: Diagnosis not present

## 2022-02-06 DIAGNOSIS — I5033 Acute on chronic diastolic (congestive) heart failure: Secondary | ICD-10-CM | POA: Diagnosis present

## 2022-02-06 DIAGNOSIS — Z87440 Personal history of urinary (tract) infections: Secondary | ICD-10-CM | POA: Diagnosis not present

## 2022-02-06 DIAGNOSIS — K219 Gastro-esophageal reflux disease without esophagitis: Secondary | ICD-10-CM | POA: Diagnosis present

## 2022-02-06 DIAGNOSIS — G4700 Insomnia, unspecified: Secondary | ICD-10-CM | POA: Diagnosis not present

## 2022-02-06 DIAGNOSIS — I952 Hypotension due to drugs: Secondary | ICD-10-CM | POA: Diagnosis not present

## 2022-02-06 DIAGNOSIS — Z7982 Long term (current) use of aspirin: Secondary | ICD-10-CM

## 2022-02-06 DIAGNOSIS — N179 Acute kidney failure, unspecified: Secondary | ICD-10-CM | POA: Diagnosis not present

## 2022-02-06 DIAGNOSIS — R5381 Other malaise: Secondary | ICD-10-CM | POA: Diagnosis present

## 2022-02-06 DIAGNOSIS — Z8711 Personal history of peptic ulcer disease: Secondary | ICD-10-CM

## 2022-02-06 DIAGNOSIS — Z9981 Dependence on supplemental oxygen: Secondary | ICD-10-CM

## 2022-02-06 DIAGNOSIS — Z79899 Other long term (current) drug therapy: Secondary | ICD-10-CM

## 2022-02-06 DIAGNOSIS — I5189 Other ill-defined heart diseases: Secondary | ICD-10-CM | POA: Diagnosis not present

## 2022-02-06 DIAGNOSIS — B9620 Unspecified Escherichia coli [E. coli] as the cause of diseases classified elsewhere: Secondary | ICD-10-CM | POA: Diagnosis present

## 2022-02-06 DIAGNOSIS — J9601 Acute respiratory failure with hypoxia: Secondary | ICD-10-CM | POA: Diagnosis present

## 2022-02-06 DIAGNOSIS — I82409 Acute embolism and thrombosis of unspecified deep veins of unspecified lower extremity: Secondary | ICD-10-CM | POA: Diagnosis not present

## 2022-02-06 DIAGNOSIS — M1711 Unilateral primary osteoarthritis, right knee: Secondary | ICD-10-CM | POA: Diagnosis present

## 2022-02-06 DIAGNOSIS — Z90710 Acquired absence of both cervix and uterus: Secondary | ICD-10-CM

## 2022-02-06 DIAGNOSIS — Z20822 Contact with and (suspected) exposure to covid-19: Secondary | ICD-10-CM | POA: Diagnosis present

## 2022-02-06 DIAGNOSIS — J690 Pneumonitis due to inhalation of food and vomit: Secondary | ICD-10-CM | POA: Diagnosis present

## 2022-02-06 DIAGNOSIS — R0902 Hypoxemia: Secondary | ICD-10-CM | POA: Diagnosis not present

## 2022-02-06 DIAGNOSIS — D509 Iron deficiency anemia, unspecified: Secondary | ICD-10-CM | POA: Diagnosis not present

## 2022-02-06 DIAGNOSIS — I82452 Acute embolism and thrombosis of left peroneal vein: Secondary | ICD-10-CM | POA: Diagnosis not present

## 2022-02-06 DIAGNOSIS — Z880 Allergy status to penicillin: Secondary | ICD-10-CM

## 2022-02-06 DIAGNOSIS — R0602 Shortness of breath: Secondary | ICD-10-CM | POA: Diagnosis not present

## 2022-02-06 DIAGNOSIS — F23 Brief psychotic disorder: Secondary | ICD-10-CM | POA: Diagnosis present

## 2022-02-06 DIAGNOSIS — Z6841 Body Mass Index (BMI) 40.0 and over, adult: Secondary | ICD-10-CM | POA: Diagnosis not present

## 2022-02-06 DIAGNOSIS — Z87891 Personal history of nicotine dependence: Secondary | ICD-10-CM

## 2022-02-06 DIAGNOSIS — F05 Delirium due to known physiological condition: Secondary | ICD-10-CM | POA: Diagnosis present

## 2022-02-06 DIAGNOSIS — Z8616 Personal history of COVID-19: Secondary | ICD-10-CM

## 2022-02-06 DIAGNOSIS — R718 Other abnormality of red blood cells: Secondary | ICD-10-CM

## 2022-02-06 DIAGNOSIS — E871 Hypo-osmolality and hyponatremia: Secondary | ICD-10-CM | POA: Diagnosis not present

## 2022-02-06 DIAGNOSIS — R778 Other specified abnormalities of plasma proteins: Secondary | ICD-10-CM | POA: Diagnosis not present

## 2022-02-06 DIAGNOSIS — Z8249 Family history of ischemic heart disease and other diseases of the circulatory system: Secondary | ICD-10-CM

## 2022-02-06 DIAGNOSIS — E66813 Obesity, class 3: Secondary | ICD-10-CM | POA: Diagnosis present

## 2022-02-06 DIAGNOSIS — N390 Urinary tract infection, site not specified: Secondary | ICD-10-CM | POA: Diagnosis present

## 2022-02-06 DIAGNOSIS — E8729 Other acidosis: Secondary | ICD-10-CM | POA: Diagnosis present

## 2022-02-06 DIAGNOSIS — G0481 Other encephalitis and encephalomyelitis: Secondary | ICD-10-CM | POA: Diagnosis present

## 2022-02-06 DIAGNOSIS — R0681 Apnea, not elsewhere classified: Secondary | ICD-10-CM | POA: Diagnosis not present

## 2022-02-06 DIAGNOSIS — G9340 Encephalopathy, unspecified: Secondary | ICD-10-CM

## 2022-02-06 DIAGNOSIS — I10 Essential (primary) hypertension: Secondary | ICD-10-CM | POA: Diagnosis not present

## 2022-02-06 DIAGNOSIS — K279 Peptic ulcer, site unspecified, unspecified as acute or chronic, without hemorrhage or perforation: Secondary | ICD-10-CM | POA: Diagnosis present

## 2022-02-06 DIAGNOSIS — K649 Unspecified hemorrhoids: Secondary | ICD-10-CM | POA: Diagnosis present

## 2022-02-06 DIAGNOSIS — J9621 Acute and chronic respiratory failure with hypoxia: Secondary | ICD-10-CM | POA: Diagnosis present

## 2022-02-06 DIAGNOSIS — G4733 Obstructive sleep apnea (adult) (pediatric): Secondary | ICD-10-CM | POA: Diagnosis present

## 2022-02-06 DIAGNOSIS — G9341 Metabolic encephalopathy: Secondary | ICD-10-CM | POA: Diagnosis not present

## 2022-02-06 DIAGNOSIS — E876 Hypokalemia: Secondary | ICD-10-CM | POA: Diagnosis present

## 2022-02-06 DIAGNOSIS — E785 Hyperlipidemia, unspecified: Secondary | ICD-10-CM | POA: Diagnosis present

## 2022-02-06 DIAGNOSIS — B952 Enterococcus as the cause of diseases classified elsewhere: Secondary | ICD-10-CM | POA: Diagnosis present

## 2022-02-06 DIAGNOSIS — G2581 Restless legs syndrome: Secondary | ICD-10-CM | POA: Diagnosis present

## 2022-02-06 DIAGNOSIS — J9811 Atelectasis: Secondary | ICD-10-CM | POA: Diagnosis not present

## 2022-02-06 DIAGNOSIS — E279 Disorder of adrenal gland, unspecified: Secondary | ICD-10-CM | POA: Diagnosis present

## 2022-02-06 DIAGNOSIS — E870 Hyperosmolality and hypernatremia: Secondary | ICD-10-CM | POA: Diagnosis present

## 2022-02-06 LAB — I-STAT ARTERIAL BLOOD GAS, ED
Acid-Base Excess: 18 mmol/L — ABNORMAL HIGH (ref 0.0–2.0)
Bicarbonate: 49.4 mmol/L — ABNORMAL HIGH (ref 20.0–28.0)
Calcium, Ion: 1.19 mmol/L (ref 1.15–1.40)
HCT: 47 % — ABNORMAL HIGH (ref 36.0–46.0)
Hemoglobin: 16 g/dL — ABNORMAL HIGH (ref 12.0–15.0)
O2 Saturation: 98 %
Patient temperature: 98
Potassium: 3.3 mmol/L — ABNORMAL LOW (ref 3.5–5.1)
Sodium: 131 mmol/L — ABNORMAL LOW (ref 135–145)
TCO2: >50 mmol/L — ABNORMAL HIGH (ref 22–32)
pCO2 arterial: 84.8 mmHg (ref 32–48)
pH, Arterial: 7.372 (ref 7.35–7.45)
pO2, Arterial: 121 mmHg — ABNORMAL HIGH (ref 83–108)

## 2022-02-06 LAB — RESP PANEL BY RT-PCR (FLU A&B, COVID) ARPGX2
Influenza A by PCR: NEGATIVE
Influenza B by PCR: NEGATIVE
SARS Coronavirus 2 by RT PCR: NEGATIVE

## 2022-02-06 LAB — I-STAT VENOUS BLOOD GAS, ED
Acid-Base Excess: 18 mmol/L — ABNORMAL HIGH (ref 0.0–2.0)
Acid-Base Excess: 18 mmol/L — ABNORMAL HIGH (ref 0.0–2.0)
Bicarbonate: 46.2 mmol/L — ABNORMAL HIGH (ref 20.0–28.0)
Bicarbonate: 47.8 mmol/L — ABNORMAL HIGH (ref 20.0–28.0)
Calcium, Ion: 1.04 mmol/L — ABNORMAL LOW (ref 1.15–1.40)
Calcium, Ion: 1.04 mmol/L — ABNORMAL LOW (ref 1.15–1.40)
HCT: 49 % — ABNORMAL HIGH (ref 36.0–46.0)
HCT: 50 % — ABNORMAL HIGH (ref 36.0–46.0)
Hemoglobin: 16.7 g/dL — ABNORMAL HIGH (ref 12.0–15.0)
Hemoglobin: 17 g/dL — ABNORMAL HIGH (ref 12.0–15.0)
O2 Saturation: 93 %
O2 Saturation: 98 %
Potassium: 3.2 mmol/L — ABNORMAL LOW (ref 3.5–5.1)
Potassium: 3.6 mmol/L (ref 3.5–5.1)
Sodium: 131 mmol/L — ABNORMAL LOW (ref 135–145)
Sodium: 132 mmol/L — ABNORMAL LOW (ref 135–145)
TCO2: 48 mmol/L — ABNORMAL HIGH (ref 22–32)
TCO2: >50 mmol/L — ABNORMAL HIGH (ref 22–32)
pCO2, Ven: 64.2 mmHg — ABNORMAL HIGH (ref 44–60)
pCO2, Ven: 75.9 mmHg (ref 44–60)
pH, Ven: 7.465 — ABNORMAL HIGH (ref 7.25–7.43)
pH, Ven: 7.407 (ref 7.25–7.43)
pO2, Ven: 66 mmHg — ABNORMAL HIGH (ref 32–45)
pO2, Ven: 107 mmHg — ABNORMAL HIGH (ref 32–45)

## 2022-02-06 LAB — URINALYSIS, ROUTINE W REFLEX MICROSCOPIC
Bilirubin Urine: NEGATIVE
Glucose, UA: NEGATIVE mg/dL
Hgb urine dipstick: NEGATIVE
Ketones, ur: 80 mg/dL — AB
Leukocytes,Ua: NEGATIVE
Nitrite: NEGATIVE
Protein, ur: NEGATIVE mg/dL
Specific Gravity, Urine: 1.023 (ref 1.005–1.030)
pH: 7 (ref 5.0–8.0)

## 2022-02-06 LAB — RAPID URINE DRUG SCREEN, HOSP PERFORMED
Amphetamines: NOT DETECTED
Barbiturates: NOT DETECTED
Benzodiazepines: NOT DETECTED
Cocaine: NOT DETECTED
Opiates: NOT DETECTED
Tetrahydrocannabinol: NOT DETECTED

## 2022-02-06 LAB — CBC WITH DIFFERENTIAL/PLATELET
Abs Immature Granulocytes: 0.01 10*3/uL (ref 0.00–0.07)
Basophils Absolute: 0 10*3/uL (ref 0.0–0.1)
Basophils Relative: 1 %
Eosinophils Absolute: 0 10*3/uL (ref 0.0–0.5)
Eosinophils Relative: 1 %
HCT: 49.5 % — ABNORMAL HIGH (ref 36.0–46.0)
Hemoglobin: 15.2 g/dL — ABNORMAL HIGH (ref 12.0–15.0)
Immature Granulocytes: 0 %
Lymphocytes Relative: 15 %
Lymphs Abs: 0.6 10*3/uL — ABNORMAL LOW (ref 0.7–4.0)
MCH: 23.9 pg — ABNORMAL LOW (ref 26.0–34.0)
MCHC: 30.7 g/dL (ref 30.0–36.0)
MCV: 77.8 fL — ABNORMAL LOW (ref 80.0–100.0)
Monocytes Absolute: 0.4 10*3/uL (ref 0.1–1.0)
Monocytes Relative: 10 %
Neutro Abs: 3 10*3/uL (ref 1.7–7.7)
Neutrophils Relative %: 73 %
Platelets: 269 10*3/uL (ref 150–400)
RBC: 6.36 MIL/uL — ABNORMAL HIGH (ref 3.87–5.11)
RDW: 16.4 % — ABNORMAL HIGH (ref 11.5–15.5)
WBC: 4.1 10*3/uL (ref 4.0–10.5)
nRBC: 0 % (ref 0.0–0.2)

## 2022-02-06 LAB — COMPREHENSIVE METABOLIC PANEL
ALT: 18 U/L (ref 0–44)
AST: 28 U/L (ref 15–41)
Albumin: 3.1 g/dL — ABNORMAL LOW (ref 3.5–5.0)
Alkaline Phosphatase: 47 U/L (ref 38–126)
Anion gap: 12 (ref 5–15)
BUN: 13 mg/dL (ref 8–23)
CO2: 41 mmol/L — ABNORMAL HIGH (ref 22–32)
Calcium: 9.4 mg/dL (ref 8.9–10.3)
Chloride: 83 mmol/L — ABNORMAL LOW (ref 98–111)
Creatinine, Ser: 0.71 mg/dL (ref 0.44–1.00)
GFR, Estimated: >60 mL/min (ref >60)
Glucose, Bld: 89 mg/dL (ref 70–99)
Potassium: 3.3 mmol/L — ABNORMAL LOW (ref 3.5–5.1)
Sodium: 136 mmol/L (ref 135–145)
Total Bilirubin: 0.6 mg/dL (ref 0.3–1.2)
Total Protein: 6.3 g/dL — ABNORMAL LOW (ref 6.5–8.1)

## 2022-02-06 LAB — AMMONIA: Ammonia: 20 umol/L (ref 9–35)

## 2022-02-06 LAB — TROPONIN I (HIGH SENSITIVITY)
Troponin I (High Sensitivity): 55 ng/L — ABNORMAL HIGH (ref <18)
Troponin I (High Sensitivity): 67 ng/L — ABNORMAL HIGH (ref <18)

## 2022-02-06 LAB — BRAIN NATRIURETIC PEPTIDE: B Natriuretic Peptide: 12.2 pg/mL (ref 0.0–100.0)

## 2022-02-06 LAB — ETHANOL: Alcohol, Ethyl (B): 65 mg/dL — ABNORMAL HIGH (ref <10)

## 2022-02-06 MED ORDER — ASPIRIN EC 81 MG PO TBEC
81.0000 mg | DELAYED_RELEASE_TABLET | Freq: Every day | ORAL | Status: DC
  Administered 2022-02-07 – 2022-02-20 (×5): 81 mg via ORAL
  Filled 2022-02-06 (×11): qty 1

## 2022-02-06 MED ORDER — POTASSIUM CHLORIDE 10 MEQ/100ML IV SOLN
10.0000 meq | INTRAVENOUS | Status: AC
  Administered 2022-02-06 (×3): 10 meq via INTRAVENOUS
  Filled 2022-02-06 (×3): qty 100

## 2022-02-06 MED ORDER — SODIUM CHLORIDE 0.9% FLUSH
3.0000 mL | Freq: Two times a day (BID) | INTRAVENOUS | Status: DC
  Administered 2022-02-07 – 2022-02-25 (×31): 3 mL via INTRAVENOUS
  Administered 2022-02-25: 10 mL via INTRAVENOUS
  Administered 2022-02-26: 3 mL via INTRAVENOUS
  Administered 2022-02-27: 10 mL via INTRAVENOUS
  Administered 2022-02-27 – 2022-03-11 (×16): 3 mL via INTRAVENOUS

## 2022-02-06 MED ORDER — HYDROCORTISONE ACETATE 25 MG RE SUPP
25.0000 mg | Freq: Two times a day (BID) | RECTAL | Status: DC
  Administered 2022-02-07 – 2022-02-11 (×7): 25 mg via RECTAL
  Filled 2022-02-06 (×12): qty 1

## 2022-02-06 MED ORDER — ENOXAPARIN SODIUM 40 MG/0.4ML IJ SOSY
40.0000 mg | PREFILLED_SYRINGE | INTRAMUSCULAR | Status: DC
  Administered 2022-02-06 – 2022-02-07 (×2): 40 mg via SUBCUTANEOUS
  Filled 2022-02-06 (×2): qty 0.4

## 2022-02-06 MED ORDER — ACETAMINOPHEN 325 MG PO TABS
650.0000 mg | ORAL_TABLET | Freq: Four times a day (QID) | ORAL | Status: DC | PRN
  Administered 2022-02-20 – 2022-02-24 (×4): 650 mg via ORAL
  Filled 2022-02-06 (×4): qty 2

## 2022-02-06 MED ORDER — SODIUM CHLORIDE 0.9 % IV SOLN
1.0000 g | Freq: Once | INTRAVENOUS | Status: AC
  Administered 2022-02-06: 1 g via INTRAVENOUS
  Filled 2022-02-06: qty 10

## 2022-02-06 MED ORDER — ACETAMINOPHEN 650 MG RE SUPP: 650.0000 mg | Freq: Four times a day (QID) | RECTAL | Status: DC | PRN

## 2022-02-06 MED ORDER — ATORVASTATIN CALCIUM 40 MG PO TABS
40.0000 mg | ORAL_TABLET | Freq: Every day | ORAL | Status: DC
  Administered 2022-02-07 – 2022-02-12 (×6): 40 mg via ORAL
  Filled 2022-02-06 (×9): qty 1

## 2022-02-06 MED ORDER — SPIRONOLACTONE 25 MG PO TABS
25.0000 mg | ORAL_TABLET | Freq: Every day | ORAL | Status: DC
  Administered 2022-02-07 – 2022-02-10 (×4): 25 mg via ORAL
  Filled 2022-02-06 (×8): qty 1

## 2022-02-06 MED ORDER — METOPROLOL SUCCINATE ER 25 MG PO TB24
12.5000 mg | ORAL_TABLET | Freq: Every morning | ORAL | Status: DC
  Administered 2022-02-07 – 2022-02-10 (×4): 12.5 mg via ORAL
  Filled 2022-02-06 (×10): qty 1

## 2022-02-06 MED ORDER — SODIUM CHLORIDE 0.9 % IV SOLN: 1.0000 g | INTRAVENOUS | Status: DC

## 2022-02-06 MED ORDER — ALBUTEROL SULFATE (2.5 MG/3ML) 0.083% IN NEBU
2.5000 mg | INHALATION_SOLUTION | Freq: Four times a day (QID) | RESPIRATORY_TRACT | Status: DC | PRN
  Administered 2022-02-24: 2.5 mg via RESPIRATORY_TRACT
  Filled 2022-02-06: qty 3

## 2022-02-06 NOTE — ED Notes (Signed)
Patient's family member came to nurses station and updated on plan of care.  Patient's family member notified that he will be let know when patient gets a room.  ?

## 2022-02-06 NOTE — H&P (Addendum)
?History and Physical  ? ? ?Patient: Tamara Russell ZOX:[id number] DOB: [birth year] ?DOA: 02/06/2022 ?DOS: the patient was seen and examined on 02/06/2022 ?PCP: Renaye RakersBland, Veita, MD  ?Patient coming from: Home via EMS ? ?Chief Complaint:  ?Chief Complaint  ?Patient presents with  ? Altered Mental Status  ? ?HPI: Tamara CareHattie Mae Russell is a 61 y.o. female with medical history significant of hypertension, reformed smoker, and obesity who presents after being found altered this morning sitting on the porch.  History is obtained from the patient as well as her daughter who is present at bedside.  Her daughter notes that the patient is normally alert and oriented and memory has been good.   Patient had just recently been hospitalized from 4/19-4/23 after presenting for altered mental status.  Noted to be hypoxic started on 2 L of nasal cannula oxygen with urine culture significant for E. coli 20,000 colonies /ml and Citrobacter koseri 60,000 colonies/ml treated with Rocephin IV transition to cephalexin.  Daughter states that since the hospitalization she never returned back to her baseline.  Her daughter had helped put her to bed last night and the patient was wearing her oxygen at that time.  However, was found on her front porch this morning yelling for 911. Her daughter notes that she has had decreased appetite and reports that everything tastes like vomit.  Patient does also report to having a gas heater in the home.  Patient becomes tearful and reports that she had seen her father who passed away several years ago.  Patient denies having any significant fever, chest pain, abdominal pain, or dysuria symptoms. On EMS arrival patient was noted to be hypoxic down to 88% on room air was placed on 2 L nasal cannula oxygen.  Given Zofran with reports of nausea.  Her daughter notes that the patient does not drink alcohol.  Patient reports that she drinks wine last night as she heard that it was good for the heart, but her  daughter is present at bedside states that they highly doubt that the patient drank alcohol.  She reports that she has been under a lot of stressors with her current household situation. ? ?On admission into the emergency department patient was seen to be afebrile with pulse 95-110, respirations 21-28, blood pressures maintained, and O2 saturations currently maintained on 2 L nasal cannula oxygen.  Chest x-ray noted cardiomegaly with small interstitial opacities possibly representing interstitial edema.  CT scan of the head did not note any acute abnormalities.  Labs noted hemoglobin 15.2, potassium 3.3, ammonia level 28, high-sensitivity troponin 55->67, BNP 12.2, and alcohol level 65.   Venous blood gas was significant for pH 7.465, PCO2 64.2, and PO2 66. ? ?Review of Systems: As mentioned in the history of present illness. All other systems reviewed and are negative. ?Past Medical History:  ?Diagnosis Date  ? Acid reflux   ? Complication of anesthesia   ? slow to wake up  ? History of COVID-19   ? Hypertension   ? ?Past Surgical History:  ?Procedure Laterality Date  ? ABDOMINAL HYSTERECTOMY    ? BIOPSY  11/17/2018  ? Procedure: BIOPSY;  Surgeon: Beverley FiedlerPyrtle, Jay M, MD;  [facility];  Service: Gastroenterology;;  ? BREAST EXCISIONAL BIOPSY Left   ? benign  ? BREAST EXCISIONAL BIOPSY Right   ? benign  ? ESOPHAGOGASTRODUODENOSCOPY (EGD) WITH PROPOFOL N/A 11/17/2018  ? Procedure: ESOPHAGOGASTRODUODENOSCOPY (EGD) WITH PROPOFOL;  Surgeon: Beverley FiedlerPyrtle, Jay M, MD;  [facility];  Service: Gastroenterology;  Laterality: N/A;  ? ?Social History:  reports that she quit smoking about 26 years ago. Her smoking use included cigarettes. She started smoking about 46 years ago. She has a 10.00 pack-year smoking history. She has never used smokeless tobacco. She reports that she does not drink alcohol and does not use drugs. ? ?Allergies  ?Allergen Reactions  ? Penicillins Other (See Comments)  ?  Childhood allergy. Pt does  not remember reaction  ? ? ?Family History  ?Problem Relation Age of Onset  ? Cancer Father 67  ?     stomach  ? Hypertension Father   ? Diabetes Maternal Grandmother   ? Colon cancer Maternal Grandfather   ? Diabetes Paternal Grandmother   ? Colon cancer Paternal Grandmother   ? ? ?Prior to Admission medications   ?Medication Sig Start Date End Date Taking? Authorizing Provider  ?acetaminophen (TYLENOL) 500 MG tablet Take 1 tablet (500 mg total) by mouth every 6 (six) hours as needed for moderate pain. Reported on 04/21/2016 02/05/22   Berton Mount I, MD  ?aspirin EC 81 MG tablet Take 81 mg by mouth daily as needed ("overworking heart"). Swallow whole.    [provider]  ?atorvastatin (LIPITOR) 40 MG tablet Take 1 tablet (40 mg total) by mouth daily. 02/06/22 03/08/22  Barnetta Chapel, MD  ?cephALEXin (KEFLEX) 500 MG capsule Take 1 capsule (500 mg total) by mouth 4 (four) times daily for 4 days. 02/05/22 02/09/22  Berton Mount I, MD  ?metoprolol succinate (TOPROL-XL) 25 MG 24 hr tablet Take 0.5 tablets (12.5 mg total) by mouth every morning for 30 doses. 02/06/22 03/08/22  Tessa Lerner, DO  ?Multiple Vitamins-Minerals (CENTRUM SILVER ADULT 50+ PO) Take 1 tablet by mouth daily.    [provider]  ?spironolactone (ALDACTONE) 25 MG tablet Take 1 tablet (25 mg total) by mouth daily. 02/05/22 02/05/23  Barnetta Chapel, MD  ? ? ?Physical Exam: ?Vitals:  ? 02/06/22 1100 02/06/22 1115 02/06/22 1130 02/06/22 1145  ?BP: (!) 145/73 (!) 145/74 126/77 (!) 142/88  ?Pulse: (!) 102 95 96 100  ?Resp: (!) 21 (!) 22 (!) 24 (!) 25  ?Temp:      ?TempSrc:      ?SpO2: 96% 99% 98% 95%  ? ?Exam ? ?Constitutional: Older female who appears to be no acute distress at this time ?Eyes: PERRL, lids and conjunctivae normal ?ENMT: Mucous membranes are moist. Posterior pharynx clear of any exudate or lesions.  ?Neck: normal, supple, no masses, no thyromegaly ?Respiratory: Normal respiratory effort without significant  wheezes or rhonchi.  Currently O2 saturation maintained on 2 L of nasal cannula oxygen. ?Cardiovascular: Regular rate and rhythm, no murmurs / rubs / gallops. No extremity edema.   ?Abdomen: Protuberant abdomen without tenderness to palpation.  Bowel sounds appreciated in all 4 quadrants ?Musculoskeletal: no clubbing / cyanosis. No joint deformity upper and lower extremities. Good ROM, no contractures. Normal muscle tone.  ?Skin: no rashes, lesions, ulcers. No induration ?Neurologic: CN 2-12 grossly intact.  Able to move all extremities.  Psychiatric: Patient is intermittently confused but oriented to person, place, and situation.  Patient noted to have swings of emotion and intermittently tearful. ? ?Data Reviewed: ? ?EKG reviewed normal sinus rhythm at 93 bpm.  Labs, imaging, and pertinent records reviewed as noted above ?Assessment and Plan: ?Acute metabolic encephalopathy ?Patient presents after being found to be acutely altered this morning sitting on the porch of her house yelling for 911.  She reported seeing her father who  had passed several years ago and on physical exam has frequent swings of emotions.  Just recently discharged after being noted to be altered.  Daughter notes patient had never returned back to her baseline.  Ammonia level and TSH were within normal limits.  Alcohol level was noted to be elevated at 65 although family reports that she does not drink.  Patient reported that she drank wine due to its heart benefits.  Question if patient's presentation is secondary to hypercapnia and/or alcohol. ?-Admit to a progressive ?-Follow-up UDS ?-Check RPR  ?-Check blood culture ?-Consider checking MRI of the brain once off BiPAP if patient does not return to her baseline ?-If all other medical causes are ruled out consider psychiatry evaluation. ? ?Acute respiratory failure with hypoxia and hypercapnia ?Patient was initially noted to be hypoxic at 88% on room air upon EMS arrival.  Patient supposed to  be on 2 L nasal cannula oxygen since discharge.  Initial venous blood gas noted PCO2 of 64.  ABG noted pH 7.372, PCO2 84.8, PO2 121 on 2 L of nasal cannula oxygen.  Patient appears to be compensated, but quest

## 2022-02-06 NOTE — ED Notes (Signed)
Pt placed on bipap by RT

## 2022-02-06 NOTE — Progress Notes (Signed)
Pt taken off BiPAP and placed on 2L [place] by RT. Pt tolerating well at this time, RN aware, MD aware, RT will continue to monitor.  ?

## 2022-02-06 NOTE — ED Triage Notes (Addendum)
Pt BIB [facility] from home. Pt was sitting outside on the front porch, unsure how long pt was outside. On EMS arrival, pts husband and neighbor were consoling pt. Pt states she went outside because she "needed 911". Husband reports pt recently had a sleep study and may have decreased O2 in her sleep, likely OSA but husband is unsure. Pt also has AMS, a/ox2 on arrival to ED. Pt endorses dizziness, nausea, feeling as if she has a headache. Able to answer questions but repeats "thank you Lord" and "911". SpO2 88% on room air with EMS, placed on 2L via [place], SpO2 93%. RA SpO2 in ED was 85%, fingers cold, warmed with heat packs, Spo2 95% on 2L via [place]. Given 4mg  zofran IV by EMS.  ?

## 2022-02-06 NOTE — ED Provider Notes (Signed)
? ?Emergency Department Provider Note ? ? ?I have reviewed the triage vital signs and the nursing notes. ? ? ?HISTORY ? ?Chief Complaint ?Altered Mental Status ? ? ?HPI ?Tamara Russell is a 61 y.o. female with PMH reviewed below presents to the emergency department by EMS after being found confused, outside at home.  She was discharged in the hospital yesterday.  Patient apparently went outside because she needed "911." Husband found her out there this AM. Patient hypoxemic on there arrival to 88% on RA and placed on 2L. Patient denies any pain to me. Given Zofran PTA by EMS with report of nausea. No apparent falls.  ? ? ?Past Medical History:  ?Diagnosis Date  ? Acid reflux   ? Complication of anesthesia   ? slow to wake up  ? History of COVID-19   ? Hypertension   ? ? ?Review of Systems ? ?Constitutional: No fever/chills ?Eyes: No visual changes. ?ENT: No sore throat. ?Cardiovascular: Denies chest pain. ?Respiratory: Denies shortness of breath. ?Gastrointestinal: No abdominal pain.  No nausea, no vomiting.  No diarrhea.  No constipation. ?Genitourinary: Negative for dysuria. ?Musculoskeletal: Negative for back pain. ?Skin: Negative for rash. ?Neurological: Negative for headaches, focal weakness or numbness. ? ? ?____________________________________________ ? ? ?PHYSICAL EXAM: ? ?VITAL SIGNS: ?ED Triage Vitals [02/06/22 0751]  ?Enc Vitals Group  ?   BP (!) 146/76  ?   Pulse Rate 98  ?   Resp (!) 28  ?   Temp 98 ?F (36.7 ?C)  ?   Temp Source Oral  ?   SpO2 95 %  ? ?Constitutional: Alert but answering in brief statements and appears confused with distant gaze at times. No agitation or delirium.  ?Eyes: Conjunctivae are normal. PERRL ?Head: Atraumatic. ?Nose: No congestion/rhinnorhea. ?Mouth/Throat: Mucous membranes are moist.   ?Neck: No stridor.   ?Cardiovascular: Normal rate, regular rhythm. Good peripheral circulation. Grossly normal heart sounds.   ?Respiratory: Normal respiratory effort.  No retractions.  Lungs CTAB. ?Gastrointestinal: Soft and nontender. No distention.  ?Musculoskeletal: No lower extremity tenderness nor edema. No gross deformities of extremities. ?Neurologic:  Normal speech and language. No gross focal neurologic deficits are appreciated. No facial asymmetry. 5/5 strength in the bilateral upper/lower extremities.  ?Skin:  Skin is warm, dry and intact. No rash noted. ? ? ?____________________________________________ ?  ?LABS ?(all labs ordered are listed, but only abnormal results are displayed) ? ?Labs Reviewed  ?URINE CULTURE - Abnormal; Notable for the following components:  ?    Result Value  ? Culture 40,000 COLONIES/mL ENTEROCOCCUS FAECALIS (*)   ? Organism ID, Bacteria ENTEROCOCCUS FAECALIS (*)   ? All other components within normal limits  ?COMPREHENSIVE METABOLIC PANEL - Abnormal; Notable for the following components:  ? Potassium 3.3 (*)   ? Chloride 83 (*)   ? CO2 41 (*)   ? Total Protein 6.3 (*)   ? Albumin 3.1 (*)   ? All other components within normal limits  ?CBC WITH DIFFERENTIAL/PLATELET - Abnormal; Notable for the following components:  ? RBC 6.36 (*)   ? Hemoglobin 15.2 (*)   ? HCT 49.5 (*)   ? MCV 77.8 (*)   ? MCH 23.9 (*)   ? RDW 16.4 (*)   ? Lymphs Abs 0.6 (*)   ? All other components within normal limits  ?URINALYSIS, ROUTINE W REFLEX MICROSCOPIC - Abnormal; Notable for the following components:  ? Ketones, ur 80 (*)   ? All other components within normal limits  ?ETHANOL -  Abnormal; Notable for the following components:  ? Alcohol, Ethyl (B) 65 (*)   ? All other components within normal limits  ?CARBON MONOXIDE, BLOOD (PERFORMED AT REF LAB) - Abnormal; Notable for the following components:  ? Carbon Monoxide, Blood 4.3 (*)   ? All other components within normal limits  ?CBC - Abnormal; Notable for the following components:  ? RBC 6.32 (*)   ? HCT 49.8 (*)   ? MCV 78.8 (*)   ? MCH 23.1 (*)   ? MCHC 29.3 (*)   ? RDW 16.9 (*)   ? All other components within normal limits  ?BASIC  METABOLIC PANEL - Abnormal; Notable for the following components:  ? Chloride 87 (*)   ? CO2 38 (*)   ? Glucose, Bld 106 (*)   ? All other components within normal limits  ?CBC WITH DIFFERENTIAL/PLATELET - Abnormal; Notable for the following components:  ? RBC 5.70 (*)   ? MCV 78.2 (*)   ? MCH 23.9 (*)   ? RDW 16.0 (*)   ? All other components within normal limits  ?BASIC METABOLIC PANEL - Abnormal; Notable for the following components:  ? Sodium 133 (*)   ? Chloride 85 (*)   ? CO2 39 (*)   ? Glucose, Bld 107 (*)   ? All other components within normal limits  ?BLOOD GAS, ARTERIAL - Abnormal; Notable for the following components:  ? pH, Arterial 7.51 (*)   ? pCO2 arterial 62 (*)   ? pO2, Arterial 64 (*)   ? Bicarbonate 49.5 (*)   ? Acid-Base Excess 22.4 (*)   ? All other components within normal limits  ?VITAMIN B12 - Abnormal; Notable for the following components:  ? Vitamin B-12 2,964 (*)   ? All other components within normal limits  ?CBC - Abnormal; Notable for the following components:  ? RBC 6.01 (*)   ? HCT 46.9 (*)   ? MCV 78.0 (*)   ? MCH 23.0 (*)   ? MCHC 29.4 (*)   ? RDW 15.9 (*)   ? All other components within normal limits  ?COMPREHENSIVE METABOLIC PANEL - Abnormal; Notable for the following components:  ? Chloride 88 (*)   ? CO2 38 (*)   ? Total Protein 6.4 (*)   ? Albumin 3.0 (*)   ? All other components within normal limits  ?BASIC METABOLIC PANEL - Abnormal; Notable for the following components:  ? Sodium 133 (*)   ? Chloride 88 (*)   ? CO2 37 (*)   ? BUN 6 (*)   ? All other components within normal limits  ?CBC WITH DIFFERENTIAL/PLATELET - Abnormal; Notable for the following components:  ? RBC 6.59 (*)   ? Hemoglobin 15.3 (*)   ? HCT 51.3 (*)   ? MCV 77.8 (*)   ? MCH 23.2 (*)   ? MCHC 29.8 (*)   ? RDW 17.5 (*)   ? All other components within normal limits  ?COMPREHENSIVE METABOLIC PANEL - Abnormal; Notable for the following components:  ? Chloride 91 (*)   ? CO2 37 (*)   ? Glucose, Bld 118 (*)   ?  Albumin 3.3 (*)   ? All other components within normal limits  ?BASIC METABOLIC PANEL - Abnormal; Notable for the following components:  ? Sodium 146 (*)   ? Chloride 96 (*)   ? CO2 41 (*)   ? All other components within normal limits  ?I-STAT VENOUS BLOOD GAS, ED -  Abnormal; Notable for the following components:  ? pH, Ven 7.465 (*)   ? pCO2, Ven 64.2 (*)   ? pO2, Ven 66 (*)   ? Bicarbonate 46.2 (*)   ? TCO2 48 (*)   ? Acid-Base Excess 18.0 (*)   ? Sodium 131 (*)   ? Potassium 3.2 (*)   ? Calcium, Ion 1.04 (*)   ? HCT 49.0 (*)   ? Hemoglobin 16.7 (*)   ? All other components within normal limits  ?I-STAT ARTERIAL BLOOD GAS, ED - Abnormal; Notable for the following components:  ? pCO2 arterial 84.8 (*)   ? pO2, Arterial 121 (*)   ? Bicarbonate 49.4 (*)   ? TCO2 >50 (*)   ? Acid-Base Excess 18.0 (*)   ? Sodium 131 (*)   ? Potassium 3.3 (*)   ? HCT 47.0 (*)   ? Hemoglobin 16.0 (*)   ? All other components within normal limits  ?I-STAT VENOUS BLOOD GAS, ED - Abnormal; Notable for the following components:  ? pCO2, Ven 75.9 (*)   ? pO2, Ven 107 (*)   ? Bicarbonate 47.8 (*)   ? TCO2 >50 (*)   ? Acid-Base Excess 18.0 (*)   ? Sodium 132 (*)   ? Calcium, Ion 1.04 (*)   ? HCT 50.0 (*)   ? Hemoglobin 17.0 (*)   ? All other components within normal limits  ?TROPONIN I (HIGH SENSITIVITY) - Abnormal; Notable for the following components:  ? Troponin I (High Sensitivity) 55 (*)   ? All other components within normal limits  ?TROPONIN I (HIGH SENSITIVITY) - Abnormal; Notable for the following components:  ? Troponin I (High Sensitivity) 67 (*)   ? All other components within normal limits  ?RESP PANEL BY RT-PCR (FLU A&B, COVID) [id number]  ?CULTURE, BLOOD (SINGLE)  ?BRAIN NATRIURETIC PEPTIDE  ?RAPID URINE DRUG SCREEN, HOSP PERFORMED  ?AMMONIA  ?RPR  ?MAGNESIUM  ?IRON AND TIBC  ?FERRITIN  ?VITAMIN B1  ?HEPATITIS PANEL, ACUTE  ?LEAD, BLOOD (ADULT >= 16 YRS)  ?ETHANOL  ?GLUCOSE, CAPILLARY   ? ?____________________________________________ ? ?EKG ? ? EKG Interpretation ? ?Date/Time:  Monday February 06 2022 08:56:16 EDT ?Ventricular Rate:  93 ?PR Interval:  158 ?QRS Duration: 83 ?QT Interval:  369 ?QTC Calculation: 459 ?R Axis:   5 ?Text In

## 2022-02-06 NOTE — Progress Notes (Signed)
Pt placed on BiPAP by RT per MD. Pt tolerating well at this time, RN aware, MD aware, RT will continue to monitor.  ?

## 2022-02-06 NOTE — Progress Notes (Signed)
Patient noted to have concern for hemorrhoids with complaints of pain.  Hydrocortisone suppositories ordered.  UDS negative.  Urinalysis was obtained and did not show any significant signs of infection. ?

## 2022-02-07 DIAGNOSIS — K5669 Other partial intestinal obstruction: Secondary | ICD-10-CM | POA: Diagnosis not present

## 2022-02-07 DIAGNOSIS — R918 Other nonspecific abnormal finding of lung field: Secondary | ICD-10-CM | POA: Diagnosis not present

## 2022-02-07 DIAGNOSIS — R778 Other specified abnormalities of plasma proteins: Secondary | ICD-10-CM | POA: Diagnosis not present

## 2022-02-07 DIAGNOSIS — Z452 Encounter for adjustment and management of vascular access device: Secondary | ICD-10-CM | POA: Diagnosis not present

## 2022-02-07 DIAGNOSIS — J90 Pleural effusion, not elsewhere classified: Secondary | ICD-10-CM | POA: Diagnosis not present

## 2022-02-07 DIAGNOSIS — I7789 Other specified disorders of arteries and arterioles: Secondary | ICD-10-CM | POA: Diagnosis not present

## 2022-02-07 DIAGNOSIS — I110 Hypertensive heart disease with heart failure: Secondary | ICD-10-CM | POA: Diagnosis present

## 2022-02-07 DIAGNOSIS — D751 Secondary polycythemia: Secondary | ICD-10-CM | POA: Diagnosis present

## 2022-02-07 DIAGNOSIS — G9341 Metabolic encephalopathy: Secondary | ICD-10-CM | POA: Diagnosis not present

## 2022-02-07 DIAGNOSIS — R34 Anuria and oliguria: Secondary | ICD-10-CM | POA: Diagnosis not present

## 2022-02-07 DIAGNOSIS — Z8616 Personal history of COVID-19: Secondary | ICD-10-CM | POA: Diagnosis not present

## 2022-02-07 DIAGNOSIS — J9622 Acute and chronic respiratory failure with hypercapnia: Secondary | ICD-10-CM | POA: Diagnosis not present

## 2022-02-07 DIAGNOSIS — R410 Disorientation, unspecified: Secondary | ICD-10-CM | POA: Diagnosis not present

## 2022-02-07 DIAGNOSIS — J9602 Acute respiratory failure with hypercapnia: Secondary | ICD-10-CM | POA: Diagnosis not present

## 2022-02-07 DIAGNOSIS — I82452 Acute embolism and thrombosis of left peroneal vein: Secondary | ICD-10-CM | POA: Diagnosis not present

## 2022-02-07 DIAGNOSIS — R0602 Shortness of breath: Secondary | ICD-10-CM | POA: Diagnosis not present

## 2022-02-07 DIAGNOSIS — E870 Hyperosmolality and hypernatremia: Secondary | ICD-10-CM | POA: Diagnosis not present

## 2022-02-07 DIAGNOSIS — E662 Morbid (severe) obesity with alveolar hypoventilation: Secondary | ICD-10-CM | POA: Diagnosis not present

## 2022-02-07 DIAGNOSIS — K279 Peptic ulcer, site unspecified, unspecified as acute or chronic, without hemorrhage or perforation: Secondary | ICD-10-CM | POA: Diagnosis not present

## 2022-02-07 DIAGNOSIS — I5189 Other ill-defined heart diseases: Secondary | ICD-10-CM | POA: Diagnosis not present

## 2022-02-07 DIAGNOSIS — N3000 Acute cystitis without hematuria: Secondary | ICD-10-CM | POA: Diagnosis not present

## 2022-02-07 DIAGNOSIS — E8729 Other acidosis: Secondary | ICD-10-CM | POA: Diagnosis present

## 2022-02-07 DIAGNOSIS — Z4682 Encounter for fitting and adjustment of non-vascular catheter: Secondary | ICD-10-CM | POA: Diagnosis not present

## 2022-02-07 DIAGNOSIS — J690 Pneumonitis due to inhalation of food and vomit: Secondary | ICD-10-CM | POA: Diagnosis not present

## 2022-02-07 DIAGNOSIS — J9690 Respiratory failure, unspecified, unspecified whether with hypoxia or hypercapnia: Secondary | ICD-10-CM | POA: Diagnosis not present

## 2022-02-07 DIAGNOSIS — I10 Essential (primary) hypertension: Secondary | ICD-10-CM | POA: Diagnosis not present

## 2022-02-07 DIAGNOSIS — K6389 Other specified diseases of intestine: Secondary | ICD-10-CM | POA: Diagnosis not present

## 2022-02-07 DIAGNOSIS — Z20822 Contact with and (suspected) exposure to covid-19: Secondary | ICD-10-CM | POA: Diagnosis not present

## 2022-02-07 DIAGNOSIS — I281 Aneurysm of pulmonary artery: Secondary | ICD-10-CM | POA: Diagnosis not present

## 2022-02-07 DIAGNOSIS — G4733 Obstructive sleep apnea (adult) (pediatric): Secondary | ICD-10-CM | POA: Diagnosis not present

## 2022-02-07 DIAGNOSIS — M25571 Pain in right ankle and joints of right foot: Secondary | ICD-10-CM | POA: Diagnosis not present

## 2022-02-07 DIAGNOSIS — R69 Illness, unspecified: Secondary | ICD-10-CM | POA: Diagnosis not present

## 2022-02-07 DIAGNOSIS — D509 Iron deficiency anemia, unspecified: Secondary | ICD-10-CM | POA: Diagnosis present

## 2022-02-07 DIAGNOSIS — J9601 Acute respiratory failure with hypoxia: Secondary | ICD-10-CM | POA: Diagnosis not present

## 2022-02-07 DIAGNOSIS — F22 Delusional disorders: Secondary | ICD-10-CM | POA: Diagnosis not present

## 2022-02-07 DIAGNOSIS — Z8711 Personal history of peptic ulcer disease: Secondary | ICD-10-CM | POA: Diagnosis not present

## 2022-02-07 DIAGNOSIS — M7989 Other specified soft tissue disorders: Secondary | ICD-10-CM | POA: Diagnosis not present

## 2022-02-07 DIAGNOSIS — J9811 Atelectasis: Secondary | ICD-10-CM | POA: Diagnosis not present

## 2022-02-07 DIAGNOSIS — I82409 Acute embolism and thrombosis of unspecified deep veins of unspecified lower extremity: Secondary | ICD-10-CM | POA: Diagnosis not present

## 2022-02-07 DIAGNOSIS — M25561 Pain in right knee: Secondary | ICD-10-CM | POA: Diagnosis not present

## 2022-02-07 DIAGNOSIS — G2581 Restless legs syndrome: Secondary | ICD-10-CM | POA: Diagnosis present

## 2022-02-07 DIAGNOSIS — I70223 Atherosclerosis of native arteries of extremities with rest pain, bilateral legs: Secondary | ICD-10-CM | POA: Diagnosis not present

## 2022-02-07 DIAGNOSIS — R4182 Altered mental status, unspecified: Secondary | ICD-10-CM | POA: Diagnosis not present

## 2022-02-07 DIAGNOSIS — I5033 Acute on chronic diastolic (congestive) heart failure: Secondary | ICD-10-CM | POA: Diagnosis not present

## 2022-02-07 DIAGNOSIS — R609 Edema, unspecified: Secondary | ICD-10-CM | POA: Diagnosis not present

## 2022-02-07 DIAGNOSIS — I517 Cardiomegaly: Secondary | ICD-10-CM | POA: Diagnosis not present

## 2022-02-07 DIAGNOSIS — F05 Delirium due to known physiological condition: Secondary | ICD-10-CM | POA: Diagnosis not present

## 2022-02-07 DIAGNOSIS — Z4659 Encounter for fitting and adjustment of other gastrointestinal appliance and device: Secondary | ICD-10-CM | POA: Diagnosis not present

## 2022-02-07 DIAGNOSIS — N179 Acute kidney failure, unspecified: Secondary | ICD-10-CM | POA: Diagnosis not present

## 2022-02-07 DIAGNOSIS — E871 Hypo-osmolality and hyponatremia: Secondary | ICD-10-CM | POA: Diagnosis not present

## 2022-02-07 DIAGNOSIS — I824Y2 Acute embolism and thrombosis of unspecified deep veins of left proximal lower extremity: Secondary | ICD-10-CM | POA: Diagnosis not present

## 2022-02-07 DIAGNOSIS — K56609 Unspecified intestinal obstruction, unspecified as to partial versus complete obstruction: Secondary | ICD-10-CM | POA: Diagnosis not present

## 2022-02-07 DIAGNOSIS — Z87440 Personal history of urinary (tract) infections: Secondary | ICD-10-CM | POA: Diagnosis not present

## 2022-02-07 DIAGNOSIS — F23 Brief psychotic disorder: Secondary | ICD-10-CM | POA: Diagnosis present

## 2022-02-07 DIAGNOSIS — E876 Hypokalemia: Secondary | ICD-10-CM | POA: Diagnosis not present

## 2022-02-07 DIAGNOSIS — Z743 Need for continuous supervision: Secondary | ICD-10-CM | POA: Diagnosis not present

## 2022-02-07 DIAGNOSIS — D649 Anemia, unspecified: Secondary | ICD-10-CM | POA: Diagnosis not present

## 2022-02-07 DIAGNOSIS — K567 Ileus, unspecified: Secondary | ICD-10-CM | POA: Diagnosis not present

## 2022-02-07 DIAGNOSIS — G0481 Other encephalitis and encephalomyelitis: Secondary | ICD-10-CM | POA: Diagnosis not present

## 2022-02-07 DIAGNOSIS — G9340 Encephalopathy, unspecified: Secondary | ICD-10-CM | POA: Diagnosis not present

## 2022-02-07 DIAGNOSIS — J9621 Acute and chronic respiratory failure with hypoxia: Secondary | ICD-10-CM | POA: Diagnosis not present

## 2022-02-07 DIAGNOSIS — Z6841 Body Mass Index (BMI) 40.0 and over, adult: Secondary | ICD-10-CM | POA: Diagnosis not present

## 2022-02-07 DIAGNOSIS — N390 Urinary tract infection, site not specified: Secondary | ICD-10-CM | POA: Diagnosis present

## 2022-02-07 LAB — IRON AND TIBC
Iron: 42 ug/dL (ref 28–170)
Saturation Ratios: 12 % (ref 10.4–31.8)
TIBC: 343 ug/dL (ref 250–450)
UIBC: 301 ug/dL

## 2022-02-07 LAB — BASIC METABOLIC PANEL
Anion gap: 10 (ref 5–15)
BUN: 14 mg/dL (ref 8–23)
CO2: 38 mmol/L — ABNORMAL HIGH (ref 22–32)
Calcium: 9.5 mg/dL (ref 8.9–10.3)
Chloride: 87 mmol/L — ABNORMAL LOW (ref 98–111)
Creatinine, Ser: 0.71 mg/dL (ref 0.44–1.00)
GFR, Estimated: >60 mL/min (ref >60)
Glucose, Bld: 106 mg/dL — ABNORMAL HIGH (ref 70–99)
Potassium: 3.7 mmol/L (ref 3.5–5.1)
Sodium: 135 mmol/L (ref 135–145)

## 2022-02-07 LAB — CBC
HCT: 49.8 % — ABNORMAL HIGH (ref 36.0–46.0)
Hemoglobin: 14.6 g/dL (ref 12.0–15.0)
MCH: 23.1 pg — ABNORMAL LOW (ref 26.0–34.0)
MCHC: 29.3 g/dL — ABNORMAL LOW (ref 30.0–36.0)
MCV: 78.8 fL — ABNORMAL LOW (ref 80.0–100.0)
Platelets: 292 10*3/uL (ref 150–400)
RBC: 6.32 MIL/uL — ABNORMAL HIGH (ref 3.87–5.11)
RDW: 16.9 % — ABNORMAL HIGH (ref 11.5–15.5)
WBC: 4.3 10*3/uL (ref 4.0–10.5)
nRBC: 0 % (ref 0.0–0.2)

## 2022-02-07 LAB — MAGNESIUM: Magnesium: 2 mg/dL (ref 1.7–2.4)

## 2022-02-07 LAB — FERRITIN: Ferritin: 70 ng/mL (ref 11–307)

## 2022-02-07 LAB — CARBON MONOXIDE, BLOOD (PERFORMED AT REF LAB): Carbon Monoxide, Blood: 4.3 % — ABNORMAL HIGH (ref 0.0–3.6)

## 2022-02-07 LAB — RPR: RPR Ser Ql: NONREACTIVE

## 2022-02-07 MED ORDER — SODIUM CHLORIDE 0.9 % IV SOLN
1.0000 g | INTRAVENOUS | Status: DC
  Administered 2022-02-07: 1 g via INTRAVENOUS
  Filled 2022-02-07: qty 10

## 2022-02-07 NOTE — ED Notes (Signed)
Breakfast order placed ?

## 2022-02-07 NOTE — Progress Notes (Signed)
?PROGRESS NOTE ? ?Tamara Russell  ?DOB: 09/16/1961  ?PCP: [name], MD ?DVV:[id number]  ?DOA: 02/06/2022 ? LOS: 0 days  ?Hospital Day: 2 ? ?Brief narrative: ?Tamara Russell is a 61 y.o. female with PMH significant for morbid obesity, OSA hypertension, history of COVID-19, GERD. ?Patient was brought to the ED from home on 4/24 by EMS after she was found sitting on her porch early in the morning, confused and yelling. ?At baseline, patient is alert, oriented and has good memory. ?Recently hospitalized from 4/19-4/23 for altered mental status.  At that time, she was noted to be hypoxic and also had UTI with E. coli, Citrobacter, treated with IV Rocephin and discharged on oral Keflex. ?Per family, since her hospitalization, patient's mental status has not returned back to baseline.  She has had decreased appetite.  Patient reportedly is under a lot of family stress. ?EMS noted patient to be hypoxic at 88% on room air and was placed on 2 L oxygen.   ? ?In the ED, patient was afebrile, heart rate in 90s and low 100s, blood pressure normal, respiratory rate in 20s, on 2 L oxygen by nasal cannula. ?Chest x-ray noted cardiomegaly with small interstitial opacities possibly representing interstitial edema.   ?CT scan of the head did not show any acute abnormalities.   ?Ammonia level 28, alcohol level elevated to 65. ?Venous blood gas was significant for pH 7.465, PCO2 64.2, and PO2 66. ? ?Subjective: ?Patient was seen and examined this morning.  Middle-aged African-American female.  Propped up in bed.  Alert, awake.  Oriented to place person and also time with difficulty.  Inconsistent details from the history.  Inappropriately switches from one topic to the other. ?Daughter at bedside. ?Chart reviewed ? ?Principal Problem: ?  Acute metabolic encephalopathy ?Active Problems: ?  Acute respiratory failure with hypoxia and hypercapnia (HCC) ?  Hypertension ?  PUD (peptic ulcer disease) ?  Elevated troponin ?  History  of UTI ?  Hypokalemia ?  Class 3 severe obesity due to excess calories with serious comorbidity and body mass index (BMI) of 40.0 to 44.9 in adult [facility]) ?  Microcytosis ?  Diastolic dysfunction ?  ? ?Assessment and Plan: ?Acute metabolic encephalopathy ?-Multifactorial : Incompletely treated UTI, hypercapnia, undiagnosed OSA, alcohol use, family stress.   ?-She was brought in after she was found confused and yelling in her front porch in the morning.   ?-Recently hospitalized from 4/19-4/23 for altered mental status.  At that time, she was noted to be hypoxic and also had UTI with E. coli, Citrobacter, treated with IV Rocephin and discharged on oral Keflex. ?Per family, since her hospitalization, patient's mental status has not returned back to baseline.  She was hallucinating and seeing her dead father. ?-Alcohol level was noted to be elevated to 65.  PCO2 level was elevated to be 65 and subsequently 76. ?-Mental status still remains altered.  Patient is alert, awake, oriented but states several unrelated statements during the interview.  Daughter at bedside ?-UDS negative. ?-Alcohol level is probably improving.  I would put the patient on BiPAP as needed and nightly. ?-Can consider MRI brain tomorrow if mental status is still remains altered. ?-She may need psych consult if no improvement and MRI negative  ?  ?Acute respiratory failure with hypoxia and hypercapnia ?-Patient was initially noted to be hypoxic at 88% on room air upon EMS arrival. -Initial venous blood gas noted PCO2 of 64 which worsened to 76 in few hours.  -Patient  was initiated on BiPAP.  Subsequently switched to nasal cannula.  I would resume BiPAP tonight. ?  ?Elevated troponin ?Acute.  High-sensitivity troponin is 55->67.  Noted to be uptrending.  EKG without significant ischemic changes.  Patient denies any complaints of chest pain. It was recommended for her to follow-up with [facility] cardiology in 1 week in the outpatient setting. ?   ?Hypokalemia ?-Potassium replacement given.  Continue to monitor ?  ?Polycythemia ?Microcytosis without anemia ?-Hemoglobin 15.2 g/dL on admission with low MCV and MCH.  No reports of bleeding. -Elevated hemoglobin likely due to dehydration as family reported poor p.o. intake.  Patient with prior history of gastric ulcers on prior EGD by Dr. Rhea BeltonPyrtle in 2020. ?-Continue to monitor hemoglobin. ?-May benefit from referral back to GI depending on findings ? ?Recent Labs  ?  02/06/22 ?0852 02/06/22 ?0903 02/06/22 ?1255 02/06/22 ?1351 02/07/22 ?0400  ?HGB 15.2* 16.7* 16.0* 17.0* 14.6  ?MCV 77.8*  --   --   --  78.8*  ?FERRITIN  --   --   --   --  70  ?TIBC  --   --   --   --  343  ?IRON  --   --   --   --  42  ? ?Diastolic dysfunction ?Essential hypertension ?-Home blood pressure medication regimen includes metoprolol succinate 12.5 mg daily and spironolactone 25 mg daily. ?-Last echocardiogram revealed EF of 60 to 65% with grade 1 diastolic dysfunction.  Patient does not appear grossly fluid overloaded at this time. ?-She is currently tachycardic and hypertensive.  Resume Toprol and Aldactone. ?  ?Recent UTI ?Prior to arrival.  Urine cultures positive for E. coli 20,000 colonies/ml and Citrobacter koseri 60,000 colonies/ml.  Patient has been treated with Rocephin IV and transition to cephalexin to be continued until 4/27.   ?-Continue to complete the course. ?  ?Morbid obesity ?BMI 40.74 kg/m? ? ?Goals of care ?  Code Status: Full Code  ? ? ?Mobility: PT eval once mental status improves ? ?Skin assessment:  ?  ? ?Nutritional status:  ?There is no height or weight on file to calculate BMI.  ?  ?  ? ? ? ? ?Diet:  ?Diet Order   ? ?       ?  Diet Heart Room service appropriate? Yes; Fluid consistency: Thin  Diet effective now       ?  ? ?  ?  ? ?  ? ? ?DVT prophylaxis:  ?enoxaparin (LOVENOX) injection 40 mg Start: 02/06/22 1400 ?  ?Antimicrobials: IV Rocephin ?Fluid: Not on IV fluid ?Consultants: None at ?Family  Communication: Daughter at bedside ? ?Status is: Observation ? ?Continue in-hospital care because: Continued to remain altered ?Level of care: Progressive  ? ?Dispo: The patient is from: Home ?             Anticipated d/c is to: Hopefully home in 2 to 3 days ?             Patient currently is not medically stable to d/c. ?  Difficult to place patient No ? ? ? ? ?Infusions:  ? cefTRIAXone (ROCEPHIN)  IV    ? ? ?Scheduled Meds: ? aspirin EC  81 mg Oral Daily  ? atorvastatin  40 mg Oral QHS  ? enoxaparin (LOVENOX) injection  40 mg Subcutaneous Q24H  ? hydrocortisone  25 mg Rectal BID  ? metoprolol succinate  12.5 mg Oral q morning  ? sodium chloride flush  3 mL  Intravenous Q12H  ? spironolactone  25 mg Oral Daily  ? ? ?PRN meds: ?acetaminophen **OR** acetaminophen, albuterol  ? ?Antimicrobials: ?Anti-infectives (From admission, onward)  ? ? Start     Dose/Rate Route Frequency Ordered Stop  ? 02/07/22 1345  cefTRIAXone (ROCEPHIN) 1 g in sodium chloride 0.9 % 100 mL IVPB       ? 1 g ?200 mL/hr over 30 Minutes Intravenous Every 24 hours 02/07/22 1344    ? 02/07/22 1200  cefTRIAXone (ROCEPHIN) 1 g in sodium chloride 0.9 % 100 mL IVPB  Status:  Discontinued       ? 1 g ?200 mL/hr over 30 Minutes Intravenous Every 24 hours 02/06/22 1328 02/06/22 1729  ? 02/06/22 1800  cefTRIAXone (ROCEPHIN) 1 g in sodium chloride 0.9 % 100 mL IVPB       ? 1 g ?200 mL/hr over 30 Minutes Intravenous  Once 02/06/22 1729 02/06/22 1938  ? ?  ? ? ?Objective: ?Vitals:  ? 02/07/22 1000 02/07/22 1249  ?BP: (!) 148/83 (!) 144/87  ?Pulse: (!) 117 (!) 118  ?Resp: (!) 22 (!) 24  ?Temp:  98 ?F (36.7 ?C)  ?SpO2: 95% 93%  ? ?No intake or output data in the 24 hours ending 02/07/22 1347 ?There were no vitals filed for this visit. ?Weight change:  ?There is no height or weight on file to calculate BMI.  ? ?Physical Exam: ?General exam: Pleasant, middle-aged obese African-American female.  Not in pain ?Skin: No rashes, lesions or ulcers. ?HEENT: Atraumatic,  normocephalic, no obvious bleeding ?Lungs: Clear to auscultation bilaterally ?CVS: Mild tachycardia, no murmur ?GI/Abd soft, nontender, distended with obesity ?CNS: Alert, awake, oriented x3 but with difficulty ?Psychiatry:

## 2022-02-07 NOTE — Progress Notes (Signed)
Husband and daughter came to me requesting pt be made a no info pt. Family doesn't want any info given over phone unless the person knows the password. They also requested nobody be allowed to visit unless they know the password. Attempted to explain that if they know the rm number they can just go to the rm they don't have to stop at the desk but if they do the staff won't tell them the rm number unless they know the password. Delcine RN Educator stated I needed to contact registration and make the pt a XXX. I talked to the registration desk and she stated they don't do that anymore but she would make the pt a private encounter. ?

## 2022-02-08 ENCOUNTER — Inpatient Hospital Stay (HOSPITAL_COMMUNITY): Payer: 59

## 2022-02-08 DIAGNOSIS — I5189 Other ill-defined heart diseases: Secondary | ICD-10-CM | POA: Diagnosis not present

## 2022-02-08 DIAGNOSIS — G9341 Metabolic encephalopathy: Secondary | ICD-10-CM | POA: Diagnosis not present

## 2022-02-08 DIAGNOSIS — I10 Essential (primary) hypertension: Secondary | ICD-10-CM | POA: Diagnosis not present

## 2022-02-08 DIAGNOSIS — Z6841 Body Mass Index (BMI) 40.0 and over, adult: Secondary | ICD-10-CM | POA: Diagnosis not present

## 2022-02-08 LAB — BASIC METABOLIC PANEL
Anion gap: 9 (ref 5–15)
BUN: 10 mg/dL (ref 8–23)
CO2: 39 mmol/L — ABNORMAL HIGH (ref 22–32)
Calcium: 8.9 mg/dL (ref 8.9–10.3)
Chloride: 85 mmol/L — ABNORMAL LOW (ref 98–111)
Creatinine, Ser: 0.47 mg/dL (ref 0.44–1.00)
GFR, Estimated: >60 mL/min (ref >60)
Glucose, Bld: 107 mg/dL — ABNORMAL HIGH (ref 70–99)
Potassium: 3.5 mmol/L (ref 3.5–5.1)
Sodium: 133 mmol/L — ABNORMAL LOW (ref 135–145)

## 2022-02-08 LAB — CBC WITH DIFFERENTIAL/PLATELET
Abs Immature Granulocytes: 0.01 10*3/uL (ref 0.00–0.07)
Basophils Absolute: 0 10*3/uL (ref 0.0–0.1)
Basophils Relative: 1 %
Eosinophils Absolute: 0.1 10*3/uL (ref 0.0–0.5)
Eosinophils Relative: 1 %
HCT: 44.6 % (ref 36.0–46.0)
Hemoglobin: 13.6 g/dL (ref 12.0–15.0)
Immature Granulocytes: 0 %
Lymphocytes Relative: 20 %
Lymphs Abs: 0.9 10*3/uL (ref 0.7–4.0)
MCH: 23.9 pg — ABNORMAL LOW (ref 26.0–34.0)
MCHC: 30.5 g/dL (ref 30.0–36.0)
MCV: 78.2 fL — ABNORMAL LOW (ref 80.0–100.0)
Monocytes Absolute: 0.4 10*3/uL (ref 0.1–1.0)
Monocytes Relative: 10 %
Neutro Abs: 2.8 10*3/uL (ref 1.7–7.7)
Neutrophils Relative %: 68 %
Platelets: 239 10*3/uL (ref 150–400)
RBC: 5.7 MIL/uL — ABNORMAL HIGH (ref 3.87–5.11)
RDW: 16 % — ABNORMAL HIGH (ref 11.5–15.5)
WBC: 4.2 10*3/uL (ref 4.0–10.5)
nRBC: 0 % (ref 0.0–0.2)

## 2022-02-08 LAB — BLOOD GAS, ARTERIAL
Acid-Base Excess: 22.4 mmol/L — ABNORMAL HIGH (ref 0.0–2.0)
Bicarbonate: 49.5 mmol/L — ABNORMAL HIGH (ref 20.0–28.0)
Drawn by: 336832
O2 Saturation: 94.9 %
Patient temperature: 37
pCO2 arterial: 62 mmHg — ABNORMAL HIGH (ref 32–48)
pH, Arterial: 7.51 — ABNORMAL HIGH (ref 7.35–7.45)
pO2, Arterial: 64 mmHg — ABNORMAL LOW (ref 83–108)

## 2022-02-08 LAB — URINE CULTURE: Culture: 40000 — AB

## 2022-02-08 LAB — VITAMIN B12: Vitamin B-12: 2964 pg/mL — ABNORMAL HIGH (ref 180–914)

## 2022-02-08 MED ORDER — FOSFOMYCIN TROMETHAMINE 3 G PO PACK
3.0000 g | PACK | Freq: Once | ORAL | Status: AC
  Administered 2022-02-08: 3 g via ORAL
  Filled 2022-02-08: qty 3

## 2022-02-08 MED ORDER — ENOXAPARIN SODIUM 60 MG/0.6ML IJ SOSY
0.5000 mg/kg | PREFILLED_SYRINGE | INTRAMUSCULAR | Status: DC
  Administered 2022-02-08 – 2022-02-15 (×6): 50 mg via SUBCUTANEOUS
  Filled 2022-02-08 (×7): qty 0.6

## 2022-02-08 MED ORDER — LORAZEPAM 2 MG/ML IJ SOLN
1.0000 mg | Freq: Once | INTRAMUSCULAR | Status: AC | PRN
  Administered 2022-02-09: 1 mg via INTRAVENOUS
  Filled 2022-02-08: qty 1

## 2022-02-08 MED ORDER — LORAZEPAM 2 MG/ML IJ SOLN
0.5000 mg | Freq: Every day | INTRAMUSCULAR | Status: DC | PRN
  Administered 2022-02-08: 0.5 mg via INTRAVENOUS
  Filled 2022-02-08: qty 1

## 2022-02-08 NOTE — Progress Notes (Signed)
EEG complete - results pending 

## 2022-02-08 NOTE — Plan of Care (Signed)
Pt loud, disruptive, hallucinating (seeing family members in rm that aren't there), and is delusional (thinking God is talking to her and told her not to allow me close to her or take anything from me). Family requesting only family members with password be given any info on the phone or be allowed to visit pt. Explained to daughter and pt's husband we can control the info being given over the phone but unless the visitor stops at the desk to ask where the rm is we don't have control over that. Voiced understanding.  Pt fell asleep around 2300. Husband at bedside. Sats above 92% on Oxygen 2 L/M [place] ?

## 2022-02-08 NOTE — Progress Notes (Signed)
EEG tech attempted to place EEG leads. Pt refused to be touched. Dr. Jerral Ralph made ware. New orders will be placed by Dr. Jerral Ralph.  ?

## 2022-02-08 NOTE — Progress Notes (Signed)
Contacted MRI for eta when pt will go down for her scan because pt needs to be pre-medicated before she goes. MRI tech stated she would call back with an eta.  ?

## 2022-02-08 NOTE — Procedures (Signed)
Patient Name: Tamara Russell  ?MRN: [id number]  ?Epilepsy Attending: Lora Havens  ?Referring Physician/Provider: Jonetta Osgood, MD ?Date: 02/08/2022 ?Duration: 25.43 mins ? ?Patient history: 61yo F with ams. EEG to evaluate for seizure ? ?Level of alertness: Awake ? ?AEDs during EEG study: Ativan ? ?Technical aspects: This EEG study was done with scalp electrodes positioned according to the 10-20 International system of electrode placement. Electrical activity was acquired at a sampling rate of 500Hz  and reviewed with a high frequency filter of 70Hz  and a low frequency filter of 1Hz . EEG data were recorded continuously and digitally stored.  ? ?Description: The posterior dominant rhythm consists of 7.5 Hz activity of moderate voltage (25-35 uV) seen predominantly in posterior head regions, symmetric and reactive to eye opening and eye closing. Hyperventilation and photic stimulation were not performed.    ? ?IMPRESSION: ?This study is within normal limits. No seizures or epileptiform discharges were seen throughout the recording. ? ?Lora Havens  ? ?

## 2022-02-08 NOTE — Progress Notes (Signed)
?      ?                 PROGRESS NOTE ? ?      ?PATIENT DETAILS ?Name: Tamara Russell ?Age: 61 y.o. ?Sex: female ?Date of Birth: May 21, 1961 ?Admit Date: 02/06/2022 ?Admitting Physician Clydie Braun, MD ?XVQ:[id number], Adrian Saran, MD ? ?Brief Summary: ?Patient is a 61 y.o.  female morbid obesity, HTN, GERD-who was just discharged from this facility on 4/23-presented to the hospital on 4/24 with altered mental status. ? ?Significant events: ?4/19-4/23>> hospitalization for chest pain, confusion-UTI.  Status stable at discharge. ?4/24> presented to the ED for confusion-admit to [facility]. ? ?Significant studies: ?4/20>> TSH: Not elevated ?4/24>> CT head: No acute intracranial abnormality ?4/24>> CXR: No PNA ?4/24>> RPR: Nonreactive ?4/24>> NH4: Negative ?4/24>> alcohol level: 65 ?4/26>> EEG: No seizures ? ?Significant microbiology data: ?4/20>> urine culture: E. coli/Citrobacter ?4/24>> COVID/influenza PCR: Negative ?4/24>> urine culture: Enterococcus faecalis ?4/24>> blood culture: Negative ? ?Procedures: ?None ? ?Consults: ?None  ? ?Subjective: ?Confused-only following some commands.  Family at bedside-not at baseline. ? ?Objective: ?Vitals: ?Blood pressure 140/90, pulse 93, temperature 99.2 ?F (37.3 ?C), temperature source Oral, resp. rate 18, SpO2 93 %.  ? ?Exam: ?Gen Exam:not in any distress ?HEENT:atraumatic, normocephalic ?Chest: B/L clear to auscultation anteriorly ?CVS:S1S2 regular ?Abdomen:soft non tender, non distended ?Extremities:no edema ?Neurology: Non focal ?Skin: no rash ? ?Pertinent Labs/Radiology: ? ?  Latest Ref Rng & Units 02/08/2022  ?  1:07 AM 02/07/2022  ?  4:00 AM 02/06/2022  ?  1:51 PM  ?CBC  ?WBC 4.0 - 10.5 K/uL 4.2   4.3     ?Hemoglobin 12.0 - 15.0 g/dL 61.9   50.9   32.6    ?Hematocrit 36.0 - 46.0 % 44.6   49.8   50.0    ?Platelets 150 - 400 K/uL 239   292     ?  ?Lab Results  ?Component Value Date  ? NA 133 (L) 02/08/2022  ? K 3.5 02/08/2022  ? CL 85 (L) 02/08/2022  ? CO2 39 (H) 02/08/2022  ?   ? ? ?Assessment/Plan: ?Acute metabolic encephalopathy: Unclear etiology but suspicion for hypercapnia-alcohol intake (EtOH 65-but family denies daily EtOH use-had a "cup of wine" prior to presentation).  Unfortunately she appears still pretty confused-although she is able to interact some.  EEG negative for seizures.  CT head unremarkable.  ABG this morning with mild hypercarbia-and improved from prior readings.  Obtain MRI brain.  If continues to have encephalopathy without any clear-cut etiology-we will consult neurology. ? ?UTI versus asymptomatic bacteriuria: Suspect urine culture results more suggestive of asymptomatic bacteriuria-however, mental status is still significantly altered than baseline.  We will stop Rocephin and start ampicillin to see if treating Enterococcus in the urine will improve encephalopathy-although at this time I doubt this is a UTI. ? ?Chronic hypoxic respiratory failure: Per family-patient was discharged on home O2 during her most recent hospitalization. ? ?OSA: Significant hypercarbia on initial presentation-have discussed with RN/respiratory therapist-use BiPAP nightly and as needed when sleeping.  ? ?Hyponatremia: Minimal/mild-doubt this is of any clinical significance. ? ?Chronic HFpEF: Volume status stable-on Aldactone. ? ?HTN: BP stable on metoprolol and Aldactone. ? ?HLD: Statin ? ?Morbid Obesity: ?Estimated body mass index is 40.74 kg/m? as calculated from the following: ?  Height as of 11/21/21: 5\' 1"  (1.549 m). ?  Weight as of 02/05/22: 97.8 kg.  ? ?Code status: ?  Code Status: Full Code  ? ?DVT Prophylaxis: Prophylactic  Lovenox  ? ?Family Communication: Son at bedside-daughter over the phone ? ? ?Disposition Plan: ?Status is: Inpatient ?Remains inpatient appropriate because: Encephalopathy-not yet at baseline-work-up ongoing.  Not yet stable for discharge. ?  ?Planned Discharge Destination:Home ? ? ?Diet: ?Diet Order   ? ?       ?  Diet Heart Room service appropriate? Yes;  Fluid consistency: Thin  Diet effective now       ?  ? ?  ?  ? ?  ?  ? ? ?Antimicrobial agents: ?Anti-infectives (From admission, onward)  ? ? Start     Dose/Rate Route Frequency Ordered Stop  ? 02/07/22 1500  cefTRIAXone (ROCEPHIN) 1 g in sodium chloride 0.9 % 100 mL IVPB       ? 1 g ?200 mL/hr over 30 Minutes Intravenous Every 24 hours 02/07/22 1344    ? 02/07/22 1200  cefTRIAXone (ROCEPHIN) 1 g in sodium chloride 0.9 % 100 mL IVPB  Status:  Discontinued       ? 1 g ?200 mL/hr over 30 Minutes Intravenous Every 24 hours 02/06/22 1328 02/06/22 1729  ? 02/06/22 1800  cefTRIAXone (ROCEPHIN) 1 g in sodium chloride 0.9 % 100 mL IVPB       ? 1 g ?200 mL/hr over 30 Minutes Intravenous  Once 02/06/22 1729 02/06/22 1938  ? ?  ? ? ? ?MEDICATIONS: ?Scheduled Meds: ? aspirin EC  81 mg Oral Daily  ? atorvastatin  40 mg Oral QHS  ? enoxaparin (LOVENOX) injection  0.5 mg/kg Subcutaneous Q24H  ? hydrocortisone  25 mg Rectal BID  ? metoprolol succinate  12.5 mg Oral q morning  ? sodium chloride flush  3 mL Intravenous Q12H  ? spironolactone  25 mg Oral Daily  ? ?Continuous Infusions: ? cefTRIAXone (ROCEPHIN)  IV 1 g (02/07/22 1501)  ? ?PRN Meds:.acetaminophen **OR** acetaminophen, albuterol, LORazepam ? ? ?I have personally reviewed following labs and imaging studies ? ?LABORATORY DATA: ?CBC: ?Recent Labs  ?Lab 02/03/22 ?0236 02/04/22 ?0209 02/06/22 ?[id number] 02/06/22 ?[id number] 02/06/22 ?1255 02/06/22 ?1351 02/07/22 ?0400 02/08/22 ?0107  ?WBC 5.4 4.7 4.1  --   --   --  4.3 4.2  ?NEUTROABS  --  2.8 3.0  --   --   --   --  2.8  ?HGB 13.4 14.7 15.2* 16.7* 16.0* 17.0* 14.6 13.6  ?HCT 44.6 47.0* 49.5* 49.0* 47.0* 50.0* 49.8* 44.6  ?MCV 78.0* 76.9* 77.8*  --   --   --  78.8* 78.2*  ?PLT 262 284 269  --   --   --  292 239  ? ? ?Basic Metabolic Panel: ?Recent Labs  ?Lab 02/01/22 ?2336 02/02/22 ?0424 02/03/22 ?0236 02/04/22 ?[id number] 02/06/22 ?[id number] 02/06/22 ?[id number] 02/06/22 ?1255 02/06/22 ?1351 02/07/22 ?0400 02/08/22 ?0107  ?NA 134*   < > 134* 135 136  131* 131* 132* 135 133*  ?K 2.8*   < > 3.4* 3.2* 3.3* 3.2* 3.3* 3.6 3.7 3.5  ?CL 90*   < > 94* 83* 83*  --   --   --  87* 85*  ?CO2 32   < > 36* 40* 41*  --   --   --  38* 39*  ?GLUCOSE 117*   < > 78 104* 89  --   --   --  106* 107*  ?BUN <5*   < > <5* 6* 13  --   --   --  14 10  ?CREATININE 0.71   < > 0.57 0.78 0.71  --   --   --  0.71 0.47  ?CALCIUM 9.8   < > 9.0 9.0 9.4  --   --   --  9.5 8.9  ?MG 2.0  --  1.9 1.6*  --   --   --   --  2.0  --   ?PHOS  --   --  4.0  --   --   --   --   --   --   --   ? < > = values in this interval not displayed.  ? ? ?GFR: ?Estimated Creatinine Clearance: 77 mL/min (by C-G formula based on SCr of 0.47 mg/dL). ? ?Liver Function Tests: ?Recent Labs  ?Lab 02/04/22 ?0209 02/06/22 ?0852  ?AST 18 28  ?ALT 17 18  ?ALKPHOS 49 47  ?BILITOT 0.6 0.6  ?PROT 6.2* 6.3*  ?ALBUMIN 3.1* 3.1*  ? ?Recent Labs  ?Lab 02/04/22 ?0209  ?LIPASE 25  ? ?Recent Labs  ?Lab 02/02/22 ?7628 02/06/22 ?3151  ?AMMONIA 35 20  ? ? ?Coagulation Profile: ?No results for input(s): INR, PROTIME in the last 168 hours. ? ?Cardiac Enzymes: ?No results for input(s): CKTOTAL, CKMB, CKMBINDEX, TROPONINI in the last 168 hours. ? ?BNP (last 3 results) ?No results for input(s): PROBNP in the last 8760 hours. ? ?Lipid Profile: ?No results for input(s): CHOL, HDL, LDLCALC, TRIG, CHOLHDL, LDLDIRECT in the last 72 hours. ? ?Thyroid Function Tests: ?No results for input(s): TSH, T4TOTAL, FREET4, T3FREE, THYROIDAB in the last 72 hours. ? ?Anemia Panel: ?Recent Labs  ?  02/07/22 ?0400  ?FERRITIN 70  ?TIBC 343  ?IRON 42  ? ? ?Urine analysis: ?   ?Component Value Date/Time  ? COLORURINE YELLOW 02/06/2022 1425  ? APPEARANCEUR CLEAR 02/06/2022 1425  ? LABSPEC 1.023 02/06/2022 1425  ? PHURINE 7.0 02/06/2022 1425  ? GLUCOSEU NEGATIVE 02/06/2022 1425  ? HGBUR NEGATIVE 02/06/2022 1425  ? BILIRUBINUR NEGATIVE 02/06/2022 1425  ? KETONESUR 80 (A) 02/06/2022 1425  ? PROTEINUR NEGATIVE 02/06/2022 1425  ? NITRITE NEGATIVE 02/06/2022 1425  ?  LEUKOCYTESUR NEGATIVE 02/06/2022 1425  ? ? ?Sepsis Labs: ?Lactic Acid, Venous ?   ?Component Value Date/Time  ? LATICACIDVEN 1.2 11/20/2015 0600  ? ? ?MICROBIOLOGY: ?Recent Results (from the past 240 hour(s))  ?Urine C

## 2022-02-08 NOTE — Progress Notes (Addendum)
RRT attempted multiple times to obtain ABG from pt, she became aggressive with RRT and is unable to be redirected.  Son at bedside tried to reason with pt but he was unable to get her to allow RRT to stick her for the blood sample. MD notified. ?

## 2022-02-09 ENCOUNTER — Inpatient Hospital Stay (HOSPITAL_COMMUNITY): Payer: 59

## 2022-02-09 DIAGNOSIS — I10 Essential (primary) hypertension: Secondary | ICD-10-CM | POA: Diagnosis not present

## 2022-02-09 DIAGNOSIS — G9341 Metabolic encephalopathy: Secondary | ICD-10-CM | POA: Diagnosis not present

## 2022-02-09 DIAGNOSIS — I5189 Other ill-defined heart diseases: Secondary | ICD-10-CM | POA: Diagnosis not present

## 2022-02-09 LAB — CBC
HCT: 46.9 % — ABNORMAL HIGH (ref 36.0–46.0)
Hemoglobin: 13.8 g/dL (ref 12.0–15.0)
MCH: 23 pg — ABNORMAL LOW (ref 26.0–34.0)
MCHC: 29.4 g/dL — ABNORMAL LOW (ref 30.0–36.0)
MCV: 78 fL — ABNORMAL LOW (ref 80.0–100.0)
Platelets: 259 10*3/uL (ref 150–400)
RBC: 6.01 MIL/uL — ABNORMAL HIGH (ref 3.87–5.11)
RDW: 15.9 % — ABNORMAL HIGH (ref 11.5–15.5)
WBC: 4.5 10*3/uL (ref 4.0–10.5)
nRBC: 0 % (ref 0.0–0.2)

## 2022-02-09 LAB — COMPREHENSIVE METABOLIC PANEL
ALT: 20 U/L (ref 0–44)
AST: 26 U/L (ref 15–41)
Albumin: 3 g/dL — ABNORMAL LOW (ref 3.5–5.0)
Alkaline Phosphatase: 49 U/L (ref 38–126)
Anion gap: 11 (ref 5–15)
BUN: 10 mg/dL (ref 8–23)
CO2: 38 mmol/L — ABNORMAL HIGH (ref 22–32)
Calcium: 9.5 mg/dL (ref 8.9–10.3)
Chloride: 88 mmol/L — ABNORMAL LOW (ref 98–111)
Creatinine, Ser: 0.56 mg/dL (ref 0.44–1.00)
GFR, Estimated: >60 mL/min (ref >60)
Glucose, Bld: 95 mg/dL (ref 70–99)
Potassium: 3.9 mmol/L (ref 3.5–5.1)
Sodium: 137 mmol/L (ref 135–145)
Total Bilirubin: 0.7 mg/dL (ref 0.3–1.2)
Total Protein: 6.4 g/dL — ABNORMAL LOW (ref 6.5–8.1)

## 2022-02-09 MED ORDER — HALOPERIDOL LACTATE 5 MG/ML IJ SOLN
INTRAMUSCULAR | Status: AC
  Administered 2022-02-09: 5 mg
  Filled 2022-02-09: qty 1

## 2022-02-09 MED ORDER — THIAMINE HCL 100 MG/ML IJ SOLN
500.0000 mg | INTRAVENOUS | Status: AC
  Administered 2022-02-09 – 2022-02-11 (×3): 500 mg via INTRAVENOUS
  Filled 2022-02-09 (×3): qty 5

## 2022-02-09 MED ORDER — HALOPERIDOL LACTATE 5 MG/ML IJ SOLN
5.0000 mg | Freq: Once | INTRAMUSCULAR | Status: AC
  Administered 2022-02-09: 5 mg via INTRAVENOUS

## 2022-02-09 NOTE — Progress Notes (Signed)
?      ?                 PROGRESS NOTE ? ?      ?PATIENT DETAILS ?Name: Cleon Thoma ?Age: 61 y.o. ?Sex: female ?Date of Birth: Jul 09, 1961 ?Admit Date: 02/06/2022 ?Admitting Physician Clydie Braun, MD ?DXI:PJASN, Adrian Saran, MD ? ?Brief Summary: ?Patient is a 61 y.o.  female morbid obesity, HTN, GERD-who was just discharged from this facility on 4/23-presented to the hospital on 4/24 with altered mental status. ? ?Significant events: ?4/19-4/23>> hospitalization for chest pain, confusion-UTI.  Status stable at discharge. ?4/24> presented to the ED for confusion-admit to [facility]. ? ?Significant studies: ?4/20>> TSH: Not elevated ?4/24>> CT head: No acute intracranial abnormality ?4/24>> CXR: No PNA ?4/24>> RPR: Nonreactive ?4/24>> NH4: Negative ?4/24>> alcohol level: 65 ?4/26>> EEG: No seizures ?4/26>> vitamin B12: 2964 ? ?Significant microbiology data: ?4/20>> urine culture: E. coli/Citrobacter ?4/24>> COVID/influenza PCR: Negative ?4/24>> urine culture: Enterococcus faecalis ?4/24>> blood culture: Negative ? ?Procedures: ?None ? ?Consults: ?None  ? ?Subjective: ?Answer simple questions appropriately but still confused-and not yet at baseline.  Husband and daughter at bedside. ? ?Objective: ?Vitals: ?Blood pressure (!) 152/84, pulse 79, temperature 99 ?F (37.2 ?C), temperature source Oral, resp. rate 18, SpO2 97 %.  ? ?Exam: ?Gen Exam:not in any distress-awake-still confused but able to answer some simple questions. ?HEENT:atraumatic, normocephalic ?Chest: B/L clear to auscultation anteriorly ?CVS:S1S2 regular ?Abdomen:soft non tender, non distended ?Extremities:no edema ?Neurology: Non focal ?Skin: no rash  ? ?Pertinent Labs/Radiology: ? ?  Latest Ref Rng & Units 02/09/2022  ? 12:33 AM 02/08/2022  ?  1:07 AM 02/07/2022  ?  4:00 AM  ?CBC  ?WBC 4.0 - 10.5 K/uL 4.5   4.2   4.3    ?Hemoglobin 12.0 - 15.0 g/dL 05.3   97.6   73.4    ?Hematocrit 36.0 - 46.0 % 46.9   44.6   49.8    ?Platelets 150 - 400 K/uL 259   239   292     ?  ?Lab Results  ?Component Value Date  ? NA 137 02/09/2022  ? K 3.9 02/09/2022  ? CL 88 (L) 02/09/2022  ? CO2 38 (H) 02/09/2022  ? ?  ? ? ?Assessment/Plan: ?Acute metabolic encephalopathy: Unclear etiology-initially thought to have hypercapnia/alcohol/partially treated UTI as the etiology-but do not think so at this point.   (EtOH 65-but family denies daily EtOH use-had a "cup of wine" prior to presentation per son on 4/26-daughter/spouse on 4/27 do not think she had any alcohol).  Per family-she has been confused ever since her last hospitalization-she was actually brought to the hospital for AMS.  Patient remains confused-work-up in progress-MRI brain still pending-this was ordered on 4/26.  EEG negative for seizures.  Do not think patient has a UTI-suspect this is more of colonization/asymptomatic bacteriuria-she was treated for it in any event-if her mentation does not improve-no up currently etiology is evident-will require neurology evaluation. ? ?Asymptomatic bacteriuria: Suspect urine culture results more suggestive of asymptomatic bacteriuria-2 different organisms within a week in spite of treatment.  She was initially on Rocephin-plans were to start ampicillin but she has a documented allergy to PCN-hence after discussion with pharmacy-given low suspicion for UTI-we have given her 1 dose of fosfomycin.   ? ?Chronic hypoxic respiratory failure: Per family-patient was discharged on home O2 during her most recent hospitalization. ? ?OSA: Significant hypercarbia on initial presentation-better on ABG on 4/26-refused BiPAP last evening-family willing to retry  today-I have asked RN to let RT know.  Please use BiPAP when sleeping as needed and nightly. ? ?Hyponatremia: Minimal/mild-doubt this is of any clinical significance. ? ?Chronic HFpEF: Volume status stable-on Aldactone. ? ?HTN: BP stable on metoprolol and Aldactone. ? ?HLD: Statin ? ?Morbid Obesity: ?Estimated body mass index is 40.74 kg/m? as calculated  from the following: ?  Height as of 11/21/21: 5\' 1"  (1.549 m). ?  Weight as of 02/05/22: 97.8 kg.  ? ?Code status: ?  Code Status: Full Code  ? ?DVT Prophylaxis: Prophylactic Lovenox  ? ?Family Communication: Spouse and daughter at bedside. ? ? ?Disposition Plan: ?Status is: Inpatient ?Remains inpatient appropriate because: Encephalopathy-not yet at baseline-work-up ongoing.  Not yet stable for discharge. ?  ?Planned Discharge Destination:Home ? ? ?Diet: ?Diet Order   ? ?       ?  Diet Heart Room service appropriate? Yes; Fluid consistency: Thin  Diet effective now       ?  ? ?  ?  ? ?  ?  ? ? ?Antimicrobial agents: ?Anti-infectives (From admission, onward)  ? ? Start     Dose/Rate Route Frequency Ordered Stop  ? 02/08/22 1530  fosfomycin (MONUROL) packet 3 g       ? 3 g Oral  Once 02/08/22 1430 02/08/22 1548  ? 02/07/22 1500  cefTRIAXone (ROCEPHIN) 1 g in sodium chloride 0.9 % 100 mL IVPB  Status:  Discontinued       ? 1 g ?200 mL/hr over 30 Minutes Intravenous Every 24 hours 02/07/22 1344 02/08/22 1412  ? 02/07/22 1200  cefTRIAXone (ROCEPHIN) 1 g in sodium chloride 0.9 % 100 mL IVPB  Status:  Discontinued       ? 1 g ?200 mL/hr over 30 Minutes Intravenous Every 24 hours 02/06/22 1328 02/06/22 1729  ? 02/06/22 1800  cefTRIAXone (ROCEPHIN) 1 g in sodium chloride 0.9 % 100 mL IVPB       ? 1 g ?200 mL/hr over 30 Minutes Intravenous  Once 02/06/22 1729 02/06/22 1938  ? ?  ? ? ? ?MEDICATIONS: ?Scheduled Meds: ? aspirin EC  81 mg Oral Daily  ? atorvastatin  40 mg Oral QHS  ? enoxaparin (LOVENOX) injection  0.5 mg/kg Subcutaneous Q24H  ? hydrocortisone  25 mg Rectal BID  ? metoprolol succinate  12.5 mg Oral q morning  ? sodium chloride flush  3 mL Intravenous Q12H  ? spironolactone  25 mg Oral Daily  ? ?Continuous Infusions: ? ? ?PRN Meds:.acetaminophen **OR** acetaminophen, albuterol ? ? ?I have personally reviewed following labs and imaging studies ? ?LABORATORY DATA: ?CBC: ?Recent Labs  ?Lab 02/04/22 ?0209 02/06/22 ?0852  02/06/22 ?0903 02/06/22 ?1255 02/06/22 ?1351 02/07/22 ?0400 02/08/22 ?0107 02/09/22 ?02/11/22  ?WBC 4.7 4.1  --   --   --  4.3 4.2 4.5  ?NEUTROABS 2.8 3.0  --   --   --   --  2.8  --   ?HGB 14.7 15.2*   < > 16.0* 17.0* 14.6 13.6 13.8  ?HCT 47.0* 49.5*   < > 47.0* 50.0* 49.8* 44.6 46.9*  ?MCV 76.9* 77.8*  --   --   --  78.8* 78.2* 78.0*  ?PLT 284 269  --   --   --  292 239 259  ? < > = values in this interval not displayed.  ? ? ? ?Basic Metabolic Panel: ?Recent Labs  ?Lab 02/03/22 ?0236 02/04/22 ?0209 02/06/22 ?02/08/22 02/06/22 ?02/08/22 02/06/22 ?1255 02/06/22 ?1351 02/07/22 ?0400 02/08/22 ?  0107 02/09/22 ?[id number]  ?NA 134* 135 136   < > 131* 132* 135 133* 137  ?K 3.4* 3.2* 3.3*   < > 3.3* 3.6 3.7 3.5 3.9  ?CL 94* 83* 83*  --   --   --  87* 85* 88*  ?CO2 36* 40* 41*  --   --   --  38* 39* 38*  ?GLUCOSE 78 104* 89  --   --   --  106* 107* 95  ?BUN <5* 6* 13  --   --   --  14 10 10   ?CREATININE 0.57 0.78 0.71  --   --   --  0.71 0.47 0.56  ?CALCIUM 9.0 9.0 9.4  --   --   --  9.5 8.9 9.5  ?MG 1.9 1.6*  --   --   --   --  2.0  --   --   ?PHOS 4.0  --   --   --   --   --   --   --   --   ? < > = values in this interval not displayed.  ? ? ? ?GFR: ?Estimated Creatinine Clearance: 77 mL/min (by C-G formula based on SCr of 0.56 mg/dL). ? ?Liver Function Tests: ?Recent Labs  ?Lab 02/04/22 ?0209 02/06/22 ?[id number] 02/09/22 ?[id number]  ?AST 18 28 26   ?ALT 17 18 20   ?ALKPHOS 49 47 49  ?BILITOT 0.6 0.6 0.7  ?PROT 6.2* 6.3* 6.4*  ?ALBUMIN 3.1* 3.1* 3.0*  ? ? ?Recent Labs  ?Lab 02/04/22 ?0209  ?LIPASE 25  ? ? ?Recent Labs  ?Lab 02/02/22 ?[id number] 02/06/22 ?[id number]  ?AMMONIA 35 20  ? ? ? ?Coagulation Profile: ?No results for input(s): INR, PROTIME in the last 168 hours. ? ?Cardiac Enzymes: ?No results for input(s): CKTOTAL, CKMB, CKMBINDEX, TROPONINI in the last 168 hours. ? ?BNP (last 3 results) ?No results for input(s): PROBNP in the last 8760 hours. ? ?Lipid Profile: ?No results for input(s): CHOL, HDL, LDLCALC, TRIG, CHOLHDL, LDLDIRECT in the last 72  hours. ? ?Thyroid Function Tests: ?No results for input(s): TSH, T4TOTAL, FREET4, T3FREE, THYROIDAB in the last 72 hours. ? ?Anemia Panel: ?Recent Labs  ?  02/07/22 ?0400 02/08/22 ?1517  ?VITAMINB12  --  2,

## 2022-02-09 NOTE — Progress Notes (Signed)
Called RT for bipap can be placed on pt. Pt is back from MRI, calm and cooperative at the moment.  ?

## 2022-02-09 NOTE — Progress Notes (Signed)
Called MRI to inquire when pt will have her scan completed. MRI tech could not give nurse a time at this moment. Will call back for an ETA.  ?
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
## 2022-02-09 NOTE — Progress Notes (Addendum)
Went in to assess pt. Pt has pulled all of her leads and pulse ox off. Pt has pulled off her bipap, purewick, and pulled out IV. Soft mittens applied. Pt hooked back up to tele and 2 L/min O2 [place] placed on pt because she kept pulling off the bipap mask. Pt's bil upper extremities edematous. Unable to find a vein, IV team consult placed. Pt has IV medications on MAR.  ?

## 2022-02-09 NOTE — Progress Notes (Signed)
Pt was placed on BIPAP dreamstation IPAP 16/ EPAP 8 with 5 L bled in per MD request. RT will monitor.  ?

## 2022-02-09 NOTE — Progress Notes (Signed)
PT refused CPAP/BIPAP earlier  ?

## 2022-02-10 DIAGNOSIS — R4182 Altered mental status, unspecified: Secondary | ICD-10-CM

## 2022-02-10 DIAGNOSIS — J9601 Acute respiratory failure with hypoxia: Secondary | ICD-10-CM | POA: Diagnosis not present

## 2022-02-10 DIAGNOSIS — R778 Other specified abnormalities of plasma proteins: Secondary | ICD-10-CM | POA: Diagnosis not present

## 2022-02-10 DIAGNOSIS — I5189 Other ill-defined heart diseases: Secondary | ICD-10-CM | POA: Diagnosis not present

## 2022-02-10 DIAGNOSIS — G9341 Metabolic encephalopathy: Secondary | ICD-10-CM | POA: Diagnosis not present

## 2022-02-10 DIAGNOSIS — F22 Delusional disorders: Secondary | ICD-10-CM

## 2022-02-10 MED ORDER — MELATONIN 3 MG PO TABS
3.0000 mg | ORAL_TABLET | Freq: Every day | ORAL | Status: DC
Start: 2022-02-10 — End: 2022-02-18
  Administered 2022-02-12: 3 mg via ORAL
  Filled 2022-02-10 (×4): qty 1

## 2022-02-10 MED ORDER — HALOPERIDOL LACTATE 5 MG/ML IJ SOLN
5.0000 mg | Freq: Once | INTRAMUSCULAR | Status: AC
  Administered 2022-02-10: 5 mg via INTRAVENOUS
  Filled 2022-02-10: qty 1

## 2022-02-10 MED ORDER — HALOPERIDOL 5 MG PO TABS
5.0000 mg | ORAL_TABLET | Freq: Every day | ORAL | Status: DC
Start: 2022-02-10 — End: 2022-02-11
  Filled 2022-02-10: qty 1

## 2022-02-10 NOTE — Progress Notes (Signed)
RN attempted to give pt night time medications. Pt agreed to take her medications with pudding. When RN tried to give the medications with pudding, pt spitted out the medications Pt became agitated, combative and refusing anyone to touch her. RN notified provider Carollee Herter. RN requesting scheduled night time Haldol order route to be switched from PO to IV.  ? ?

## 2022-02-10 NOTE — Progress Notes (Signed)
Brief Neuro notes: ? ?I attempted to go and see Ms. Shayne Mae Avoca to assist with further evaluation of AMS. However, she had been agitated for several hours and had calmed down after getting Haldol and fell asleep a short time ago. This is the best night she has had in the hospital so far. At the request of husband, Mr. Dore Oquin, will let her sleep and she will need to be re-evaluated by [place] in AM. ? ?I had detailed discussion with Mr. Lakesia Dahle just outside the room and he reports that patient seems to be talking to people that are not there intermittently. She calls this person "[employer]". She also gets easily agitated and is very paranoid of the staff. She would be having a normal conversation and then out of nowhere, demand that people in the room quiet down so she can listen to [employer]. She would claim that facebook told her not to trust this person and would get very upset with staff. RN Maryelizabeth Kaufmann confirms that she is very distrustful of the staff. She rips out her IV lines when she is upset, tries to get out of bed and is very difficult to control. Husband reports that otherday, she confused a staff member with her daughter in law and yelled when the staff member tried to administer medications. This all started suddenly. Husband first noticed this on Thursday in the hospital. He feels she is stressed with some family issues and reports that her kids think that she has reached her breaking point. ? ?Her behavioral outburst and psychosis were initially thought to be due to CO2 accumulation from not wearing her CPAP. She has had extensive workup including encephalopathy labs which have been nonrevealing, MRI Brain with no acute intracranial abnormality, empiric thiamine replacement but husband reports only occasional social alcohol intake, she does not drink every day. Her ethanol level was mildly elevated at presentation to 65. She is getting empiric thiamine replacement however. Husband reports  that her meds were recently changed by her doctors but she has not taken those meds yet. ? ?Based on my discussion with husband, I suspect that this is probably psychosis with her responding to internal stimuli, being paranoid about staff members. I do believe that getting input from psychiatry team would be helpful but will make formal recommendations after we examine and assess for for encephalopathy and talk to the patient in morning. ? ?Erick Blinks ?[facility] ?Pager Number [phone] ?

## 2022-02-10 NOTE — TOC Transition Note (Signed)
Transition of Care (TOC) - CM/SW Discharge Note ? ? ?Patient Details  ?Name: Tamara Russell ?MRN: [id number] ?Date of Birth: May 26, 1961 ? ?Transition of Care (TOC) CM/SW Contact:  ?Verdell Carmine, RN ?Phone Number: ?02/10/2022, 9:37 AM ? ? ?Clinical Narrative:   ?Patient fluctuating in orientation. Will go home on home oxygen 2L. [employer] has been notified. Unable to secure home health, referral to outpatient services done to [facility] for OT.  ? ?  ?Barriers to Discharge:  (could not secure HH) ? ? ?Patient Goals and CMS Choice ?  ?  ?  ? ?Discharge Placement ?  ?           ? Home with Home oxygen. ?  ?  ?  ? ?Discharge Plan and Services ?  ?Discharge Planning Services: CM Consult ?           ?  ?  ?  ?  ?  ?  ?  ?  ?  ?  ? ?Social Determinants of Health (SDOH) Interventions ?  ? ? ?Readmission Risk Interventions ?   ? View : No data to display.  ?  ?  ?  ? ? ? ? ? ?
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
## 2022-02-10 NOTE — Progress Notes (Signed)
?      ?                 PROGRESS NOTE ? ?      ?PATIENT DETAILS ?Name: Tamara Russell ?Age: 61 y.o. ?Sex: female ?Date of Birth: December 22, 1960 ?Admit Date: 02/06/2022 ?Admitting Physician Clydie Braun, MD ?[id number], Adrian Saran, MD ? ?Brief Summary: ?Patient is a 61 y.o.  female morbid obesity, HTN, GERD-who was just discharged from this facility on 4/23-presented to the hospital on 4/24 with altered mental status. ? ?Significant events: ?4/19-4/23>> hospitalization for chest pain, confusion-UTI.  Status stable at discharge. ?4/24> presented to the ED for confusion-admit to [facility]. ? ?Significant studies: ?4/20>> HIV: Negative ?4/20>> TSH: Not elevated ?4/24>> CT head: No acute intracranial abnormality ?4/24>> CXR: No PNA ?4/24>> RPR: Nonreactive ?4/24>> NH4: Negative ?4/24>> alcohol level: 65 ?4/26>> EEG: No seizures ?4/26>> vitamin B12: 2964 ? ?Significant microbiology data: ?4/20>> urine culture: E. coli/Citrobacter ?4/24>> COVID/influenza PCR: Negative ?4/24>> urine culture: Enterococcus faecalis ?4/24>> blood culture: Negative ? ?Procedures: ?None ? ?Consults: ?None  ? ?Subjective: ?More awake and alert-still confused.  Per family patient still not yet at baseline. ? ?Objective: ?Vitals: ?Blood pressure (!) 169/86, pulse 100, temperature 98.3 ?F (36.8 ?C), temperature source Oral, resp. rate 19, SpO2 97 %.  ? ?Exam: ?Gen Exam:not in any distress ?HEENT:atraumatic, normocephalic ?Chest: B/L clear to auscultation anteriorly ?CVS:S1S2 regular ?Abdomen:soft non tender, non distended ?Extremities:no edema ?Neurology: Non focal ?Skin: no rash  ? ?Pertinent Labs/Radiology: ? ?  Latest Ref Rng & Units 02/09/2022  ? 12:33 AM 02/08/2022  ?  1:07 AM 02/07/2022  ?  4:00 AM  ?CBC  ?WBC 4.0 - 10.5 K/uL 4.5   4.2   4.3    ?Hemoglobin 12.0 - 15.0 g/dL 40.8   14.4   81.8    ?Hematocrit 36.0 - 46.0 % 46.9   44.6   49.8    ?Platelets 150 - 400 K/uL 259   239   292    ?  ?Lab Results  ?Component Value Date  ? NA 137 02/09/2022  ? K 3.9  02/09/2022  ? CL 88 (L) 02/09/2022  ? CO2 38 (H) 02/09/2022  ? ?  ? ? ?Assessment/Plan: ?Acute metabolic encephalopathy: Unclear etiology-extensive work  up negative. Encephalopathy persists inspite of use of BiPAP for hypercarbia, and antibiotics for UTI/Asymptomatic bacteruria.Appreciate Neurology input-given stress marital enviornment at home-suspicion is for Psychogenic etiology. Awaiting Psych input.  ? ?Asymptomatic bacteriuria: Suspect urine culture results more suggestive of asymptomatic bacteriuria rather than a UTI. Patient had 2 different organisms in urine culture within a week in spite of treatment.  She was initially on Rocephin-plans were to start ampicillin but she has a documented allergy to PCN-hence after discussion with pharmacy- given her 1 dose of fosfomycin.   ? ?Chronic hypoxic respiratory failure: Per family-patient was discharged on home O2 during her most recent hospitalization. ? ?OSA: initially refused BiPAP-now agreeable and was compliant last night. ? ?Hyponatremia: Minimal/mild-doubt this is of any clinical significance. ? ?Chronic HFpEF: Volume status stable-on Aldactone. ? ?HTN: BP stable on metoprolol and Aldactone. ? ?HLD: Statin ? ?Morbid Obesity: ?Estimated body mass index is 40.74 kg/m? as calculated from the following: ?  Height as of 11/21/21: 5\' 1"  (1.549 m). ?  Weight as of 02/05/22: 97.8 kg.  ? ?Code status: ?  Code Status: Full Code  ? ?DVT Prophylaxis: Prophylactic Lovenox  ? ?Family Communication: Spouse and daughter at bedside. ? ? ?Disposition Plan: ?Status is: Inpatient ?  Remains inpatient appropriate because: Encephalopathy-not yet at baseline-work-up ongoing.  Not yet stable for discharge. ?  ?Planned Discharge Destination:Home ? ? ?Diet: ?Diet Order   ? ?       ?  Diet Heart Room service appropriate? Yes; Fluid consistency: Thin  Diet effective now       ?  ? ?  ?  ? ?  ?  ? ? ?Antimicrobial agents: ?Anti-infectives (From admission, onward)  ? ? Start     Dose/Rate  Route Frequency Ordered Stop  ? 02/08/22 1530  fosfomycin (MONUROL) packet 3 g       ? 3 g Oral  Once 02/08/22 1430 02/08/22 1548  ? 02/07/22 1500  cefTRIAXone (ROCEPHIN) 1 g in sodium chloride 0.9 % 100 mL IVPB  Status:  Discontinued       ? 1 g ?200 mL/hr over 30 Minutes Intravenous Every 24 hours 02/07/22 1344 02/08/22 1412  ? 02/07/22 1200  cefTRIAXone (ROCEPHIN) 1 g in sodium chloride 0.9 % 100 mL IVPB  Status:  Discontinued       ? 1 g ?200 mL/hr over 30 Minutes Intravenous Every 24 hours 02/06/22 1328 02/06/22 1729  ? 02/06/22 1800  cefTRIAXone (ROCEPHIN) 1 g in sodium chloride 0.9 % 100 mL IVPB       ? 1 g ?200 mL/hr over 30 Minutes Intravenous  Once 02/06/22 1729 02/06/22 1938  ? ?  ? ? ? ?MEDICATIONS: ?Scheduled Meds: ? aspirin EC  81 mg Oral Daily  ? atorvastatin  40 mg Oral QHS  ? enoxaparin (LOVENOX) injection  0.5 mg/kg Subcutaneous Q24H  ? hydrocortisone  25 mg Rectal BID  ? metoprolol succinate  12.5 mg Oral q morning  ? sodium chloride flush  3 mL Intravenous Q12H  ? spironolactone  25 mg Oral Daily  ? ?Continuous Infusions: ? thiamine injection 500 mg (02/09/22 2043)  ? ? ?PRN Meds:.acetaminophen **OR** acetaminophen, albuterol ? ? ?I have personally reviewed following labs and imaging studies ? ?LABORATORY DATA: ?CBC: ?Recent Labs  ?Lab 02/04/22 ?0209 02/06/22 ?0852 02/06/22 ?0903 02/06/22 ?1255 02/06/22 ?1351 02/07/22 ?0400 02/08/22 ?0107 02/09/22 ?9381  ?WBC 4.7 4.1  --   --   --  4.3 4.2 4.5  ?NEUTROABS 2.8 3.0  --   --   --   --  2.8  --   ?HGB 14.7 15.2*   < > 16.0* 17.0* 14.6 13.6 13.8  ?HCT 47.0* 49.5*   < > 47.0* 50.0* 49.8* 44.6 46.9*  ?MCV 76.9* 77.8*  --   --   --  78.8* 78.2* 78.0*  ?PLT 284 269  --   --   --  292 239 259  ? < > = values in this interval not displayed.  ? ? ? ?Basic Metabolic Panel: ?Recent Labs  ?Lab 02/04/22 ?0209 02/06/22 ?0852 02/06/22 ?0903 02/06/22 ?1255 02/06/22 ?1351 02/07/22 ?0400 02/08/22 ?0107 02/09/22 ?0175  ?NA 135 136   < > 131* 132* 135 133* 137  ?K 3.2*  3.3*   < > 3.3* 3.6 3.7 3.5 3.9  ?CL 83* 83*  --   --   --  87* 85* 88*  ?CO2 40* 41*  --   --   --  38* 39* 38*  ?GLUCOSE 104* 89  --   --   --  106* 107* 95  ?BUN 6* 13  --   --   --  14 10 10   ?CREATININE 0.78 0.71  --   --   --  0.71 0.47 0.56  ?CALCIUM 9.0 9.4  --   --   --  9.5 8.9 9.5  ?MG 1.6*  --   --   --   --  2.0  --   --   ? < > = values in this interval not displayed.  ? ? ? ?GFR: ?Estimated Creatinine Clearance: 77 mL/min (by C-G formula based on SCr of 0.56 mg/dL). ? ?Liver Function Tests: ?Recent Labs  ?Lab 02/04/22 ?0209 02/06/22 ?[id number] 02/09/22 ?[id number]  ?AST 18 28 26   ?ALT 17 18 20   ?ALKPHOS 49 47 49  ?BILITOT 0.6 0.6 0.7  ?PROT 6.2* 6.3* 6.4*  ?ALBUMIN 3.1* 3.1* 3.0*  ? ? ?Recent Labs  ?Lab 02/04/22 ?0209  ?LIPASE 25  ? ? ?Recent Labs  ?Lab 02/06/22 ?0851  ?AMMONIA 20  ? ? ? ?Coagulation Profile: ?No results for input(s): INR, PROTIME in the last 168 hours. ? ?Cardiac Enzymes: ?No results for input(s): CKTOTAL, CKMB, CKMBINDEX, TROPONINI in the last 168 hours. ? ?BNP (last 3 results) ?No results for input(s): PROBNP in the last 8760 hours. ? ?Lipid Profile: ?No results for input(s): CHOL, HDL, LDLCALC, TRIG, CHOLHDL, LDLDIRECT in the last 72 hours. ? ?Thyroid Function Tests: ?No results for input(s): TSH, T4TOTAL, FREET4, T3FREE, THYROIDAB in the last 72 hours. ? ?Anemia Panel: ?Recent Labs  ?  02/08/22 ?1517  ?[id number] 2,964*  ? ? ? ?Urine analysis: ?   ?Component Value Date/Time  ? COLORURINE YELLOW 02/06/2022 1425  ? APPEARANCEUR CLEAR 02/06/2022 1425  ? LABSPEC 1.023 02/06/2022 1425  ? PHURINE 7.0 02/06/2022 1425  ? GLUCOSEU NEGATIVE 02/06/2022 1425  ? HGBUR NEGATIVE 02/06/2022 1425  ? BILIRUBINUR NEGATIVE 02/06/2022 1425  ? KETONESUR 80 (A) 02/06/2022 1425  ? PROTEINUR NEGATIVE 02/06/2022 1425  ? NITRITE NEGATIVE 02/06/2022 1425  ? LEUKOCYTESUR NEGATIVE 02/06/2022 1425  ? ? ?Sepsis Labs: ?Lactic Acid, Venous ?   ?Component Value Date/Time  ? LATICACIDVEN 1.2 11/20/2015 0600   ? ? ?MICROBIOLOGY: ?Recent Results (from the past 240 hour(s))  ?Urine Culture     Status: Abnormal  ? Collection Time: 02/02/22 11:00 AM  ? Specimen: Urine, Clean Catch  ?Result Value Ref Range Status  ? Specimen Desc

## 2022-02-10 NOTE — Plan of Care (Signed)

## 2022-02-11 DIAGNOSIS — I5189 Other ill-defined heart diseases: Secondary | ICD-10-CM | POA: Diagnosis not present

## 2022-02-11 DIAGNOSIS — J9601 Acute respiratory failure with hypoxia: Secondary | ICD-10-CM | POA: Diagnosis not present

## 2022-02-11 DIAGNOSIS — G9341 Metabolic encephalopathy: Secondary | ICD-10-CM | POA: Diagnosis not present

## 2022-02-11 DIAGNOSIS — R778 Other specified abnormalities of plasma proteins: Secondary | ICD-10-CM | POA: Diagnosis not present

## 2022-02-11 LAB — HEPATITIS PANEL, ACUTE
HCV Ab: NONREACTIVE
Hep A IgM: NONREACTIVE
Hep B C IgM: NONREACTIVE
Hepatitis B Surface Ag: NONREACTIVE

## 2022-02-11 LAB — BASIC METABOLIC PANEL
Anion gap: 8 (ref 5–15)
BUN: 6 mg/dL — ABNORMAL LOW (ref 8–23)
CO2: 37 mmol/L — ABNORMAL HIGH (ref 22–32)
Calcium: 9.1 mg/dL (ref 8.9–10.3)
Chloride: 88 mmol/L — ABNORMAL LOW (ref 98–111)
Creatinine, Ser: 0.54 mg/dL (ref 0.44–1.00)
GFR, Estimated: >60 mL/min (ref >60)
Glucose, Bld: 98 mg/dL (ref 70–99)
Potassium: 3.6 mmol/L (ref 3.5–5.1)
Sodium: 133 mmol/L — ABNORMAL LOW (ref 135–145)

## 2022-02-11 LAB — CULTURE, BLOOD (SINGLE)
Culture: NO GROWTH
Special Requests: ADEQUATE

## 2022-02-11 LAB — ETHANOL: Alcohol, Ethyl (B): <10 mg/dL (ref <10)

## 2022-02-11 MED ORDER — HALOPERIDOL LACTATE 5 MG/ML IJ SOLN
5.0000 mg | Freq: Once | INTRAMUSCULAR | Status: AC
  Administered 2022-02-11: 5 mg via INTRAVENOUS
  Filled 2022-02-11: qty 1

## 2022-02-11 MED ORDER — OLANZAPINE 5 MG PO TBDP: 10.0000 mg | ORAL_TABLET | Freq: Every day | ORAL | Status: DC

## 2022-02-11 MED ORDER — OLANZAPINE 5 MG PO TABS
10.0000 mg | ORAL_TABLET | Freq: Every day | ORAL | Status: DC
  Administered 2022-02-11: 10 mg via ORAL
  Filled 2022-02-11: qty 2

## 2022-02-11 NOTE — Consult Note (Addendum)
[facility] Face-to-Face Psychiatry Consult  ? ?Reason for Consult:  AMS and psychosis ?Referring Physician:  Dr. Sloan Leiter ?Patient Identification: Margrie Ballard ?MRN:  [id number] ?Principal Diagnosis: Acute metabolic encephalopathy ?Diagnosis:  Principal Problem: ?  Acute metabolic encephalopathy ?Active Problems: ?  Acute respiratory failure with hypoxia and hypercapnia (HCC) ?  Hypertension ?  PUD (peptic ulcer disease) ?  Elevated troponin ?  History of UTI ?  Hypokalemia ?  Class 3 severe obesity due to excess calories with serious comorbidity and body mass index (BMI) of 40.0 to 44.9 in adult [facility]) ?  Microcytosis ?  Diastolic dysfunction ? ? ?Total Time spent with patient: 30 minutes ? ?Subjective:   ?Casady Khanna is a 61 y.o. female patient  Admitted on 02/06/2022 for altered mental status and psychosis. Psychiatry consulted for AMS and psychosis.  ?Patient seen and chart reviewed- she was not medication compliant with haldol 5 mg PO as recommended by psychiatry and received IV haldol 5 mg. ? ?On interview, patient is found laying in bed with bilateral hands in mitts. She is oriented only to person and year. She is not oriented to month (states "month is 35") and is not oriented to location ("[place]"- is focused on TV screen during assessment where there was a report about [place]; however continued to report this as location throughout assessment). Patient's husband, [name], is present for part of assessment; he was on the phone in the hallway for part of assessment as well. She denies SI/HI/AVH. She pas poor concentration throughout assessment and needs questions repeated multiple times before she is able to provide an answer-bCAM performed as below-of note did not remove mitts for commands but answered 2/4 disorganized thinking questions wrong.  ? ? ? ?B-Cam: ?1. Acute change and/or fluctuating course of mental status: yes ?2. Inattention : No; states that "April" comes directly before Decembr  multiple times ?"Can you name the months backwards from Decemeber to July?" ?3. Altered Level of Consciousness: yes ?4. Disorganized Thinking (Errors >1/6): yes- states no to all questions- 2/6 errors ?"Will a stone float on water?"  "No" ?"Are there fish in the sea?"  "No" ?"Does one pound weigh more than two?"  "No" ?"Can you use a hammer to pound a nail?"  "No" ?Command(s): Did not assess this as patient has mitts on and did not remove ?"Hold up 2 fingers." ?"Now do the same thing with the other hand." ? ?Score: 1+2 AND, either 3 or 4 present = Positive b-CAM ? ? ? ?Collateral- [name] (husband) present during part of assessment ?[name] states that she has not been her normal self since approximately April 16. He states  that earlier this morning she was oriented to the month although reported the date as the 24 and knew she was in the hospital. He states that he was present overnight and that at times she appeared to be hallucinating and was talking to herself about facebook and "speaks like there are 3 people in the room". ? ?HPI :   ?Jahliya Oshinski is a 61 y.o. female patient  Admitted on 02/06/2022 for altered mental status and psychosis. Psychiatry consulted for AMS and psychosis.  ? ? ? ?Past Medical History:  ?Past Medical History:  ?Diagnosis Date  ? Acid reflux   ? Complication of anesthesia   ? slow to wake up  ? History of COVID-19   ? Hypertension   ?  ?Past Surgical History:  ?Procedure Laterality Date  ? ABDOMINAL HYSTERECTOMY    ?  BIOPSY  11/17/2018  ? Procedure: BIOPSY;  Surgeon: Jerene Bears, MD;  Location: [facility] ENDOSCOPY;  Service: Gastroenterology;;  ? BREAST EXCISIONAL BIOPSY Left   ? benign  ? BREAST EXCISIONAL BIOPSY Right   ? benign  ? ESOPHAGOGASTRODUODENOSCOPY (EGD) WITH PROPOFOL N/A 11/17/2018  ? Procedure: ESOPHAGOGASTRODUODENOSCOPY (EGD) WITH PROPOFOL;  Surgeon: Jerene Bears, MD;  Location: [facility] ENDOSCOPY;  Service: Gastroenterology;  Laterality: N/A;  ? ?Family History:  ?Family History   ?Problem Relation Age of Onset  ? Cancer Father 58  ?     stomach  ? Hypertension Father   ? Diabetes Maternal Grandmother   ? Colon cancer Maternal Grandfather   ? Diabetes Paternal Grandmother   ? Colon cancer Paternal Grandmother   ? ?Family history ?No family psychiatric history disclosed.  ?The patient's family history includes Cancer (age of onset: 59) in her father; Colon cancer in her maternal grandfather and paternal grandmother; Diabetes in her maternal grandmother and paternal grandmother; Hypertension in her father. ?  ? ? ?Social History:  ?Social History  ? ?Substance and Sexual Activity  ?Alcohol Use No  ? Alcohol/week: 0.0 standard drinks  ?   ?Social History  ? ?Substance and Sexual Activity  ?Drug Use No  ?  ?Social History  ? ?Socioeconomic History  ? Marital status: Married  ?  Spouse name: Not on file  ? Number of children: Not on file  ? Years of education: Not on file  ? Highest education level: Not on file  ?Occupational History  ? Occupation: unemployed  ?Tobacco Use  ? Smoking status: Former  ?  Packs/day: 0.50  ?  Years: 20.00  ?  Pack years: 10.00  ?  Types: Cigarettes  ?  Start date: 04/30/1975  ?  Quit date: 04/30/1995  ?  Years since quitting: 26.8  ? Smokeless tobacco: Never  ?Vaping Use  ? Vaping Use: Never used  ?Substance and Sexual Activity  ? Alcohol use: No  ?  Alcohol/week: 0.0 standard drinks  ? Drug use: No  ? Sexual activity: Not on file  ?Other Topics Concern  ? Not on file  ?Social History Narrative  ? Not on file  ? ?Social Determinants of Health  ? ?Financial Resource Strain: Not on file  ?Food Insecurity: Not on file  ?Transportation Needs: Not on file  ?Physical Activity: Not on file  ?Stress: Not on file  ?Social Connections: Not on file  ? ?Additional Social History: ?  ? ?Allergies:   ?Allergies  ?Allergen Reactions  ? Penicillins Other (See Comments)  ?  Childhood allergy. Pt does not remember reaction  ? ? ?Labs:  ?Results for orders placed or performed during the  hospital encounter of 02/06/22 (from the past 48 hour(s))  ?Hepatitis panel, acute     Status: None  ? Collection Time: 02/11/22 12:52 AM  ?Result Value Ref Range  ? Hepatitis B Surface Ag NON REACTIVE NON REACTIVE  ? HCV Ab NON REACTIVE NON REACTIVE  ?  Comment: (NOTE) ?Nonreactive HCV antibody screen is consistent with no HCV infections,  ?unless recent infection is suspected or other evidence exists to ?indicate HCV infection. ? ?  ? Hep A IgM NON REACTIVE NON REACTIVE  ? Hep B C IgM NON REACTIVE NON REACTIVE  ?  Comment: Performed at [facility], [place] [address], [place], [place] [address]  ?Ethanol     Status: None  ? Collection Time: 02/11/22 12:52 AM  ?Result Value Ref Range  ?  Alcohol, Ethyl (B) <10 <10 mg/dL  ?  Comment: (NOTE) ?Lowest detectable limit for serum alcohol is 10 mg/dL. ? ?For medical purposes only. ?Performed at [facility], [place] [address] [place], [place] ?[address] ?  ?Basic metabolic panel     Status: Abnormal  ? Collection Time: 02/11/22 12:52 AM  ?Result Value Ref Range  ? Sodium 133 (L) 135 - 145 mmol/L  ? Potassium 3.6 3.5 - 5.1 mmol/L  ? Chloride 88 (L) 98 - 111 mmol/L  ? CO2 37 (H) 22 - 32 mmol/L  ? Glucose, Bld 98 70 - 99 mg/dL  ?  Comment: Glucose reference range applies only to samples taken after fasting for at least 8 hours.  ? BUN 6 (L) 8 - 23 mg/dL  ? Creatinine, Ser 0.54 0.44 - 1.00 mg/dL  ? Calcium 9.1 8.9 - 10.3 mg/dL  ? GFR, Estimated >60 >60 mL/min  ?  Comment: (NOTE) ?Calculated using the CKD-EPI Creatinine Equation (2021) ?  ? Anion gap 8 5 - 15  ?  Comment: Performed at [facility], [place] [address], [place], [place] [address]  ? ? ?Current Facility-Administered Medications  ?Medication Dose Route Frequency Provider Last Rate Last Admin  ? acetaminophen (TYLENOL) tablet 650 mg  650 mg Oral Q6H PRN Norval Morton, MD      ? Or  ? acetaminophen (TYLENOL) suppository 650 mg  650 mg Rectal Q6H PRN Fuller Plan A, MD      ? albuterol (PROVENTIL) (2.5  MG/3ML) 0.083% nebulizer solution 2.5 mg  2.5 mg Nebulization Q6H PRN Fuller Plan A, MD      ? aspirin EC tablet 81 mg  81 mg Oral Daily Tamala Julian, Rondell A, MD   81 mg at 02/10/22 1331  ? atorvastatin

## 2022-02-11 NOTE — Plan of Care (Signed)
  Problem: Education: Goal: Knowledge of General Education information will improve Description: Including pain rating scale, medication(s)/side effects and non-pharmacologic comfort measures Outcome: Not Progressing   Problem: Health Behavior/Discharge Planning: Goal: Ability to manage health-related needs will improve Outcome: Not Progressing   Problem: Clinical Measurements: Goal: Ability to maintain clinical measurements within normal limits will improve Outcome: Not Progressing Goal: Will remain free from infection Outcome: Not Progressing Goal: Diagnostic test results will improve Outcome: Not Progressing Goal: Respiratory complications will improve Outcome: Not Progressing   Problem: Activity: Goal: Risk for activity intolerance will decrease Outcome: Not Progressing   Problem: Nutrition: Goal: Adequate nutrition will be maintained Outcome: Not Progressing   Problem: Coping: Goal: Level of anxiety will decrease Outcome: Not Progressing   Problem: Elimination: Goal: Will not experience complications related to bowel motility Outcome: Not Progressing Goal: Will not experience complications related to urinary retention Outcome: Not Progressing   Problem: Pain Managment: Goal: General experience of comfort will improve Outcome: Not Progressing   Problem: Safety: Goal: Ability to remain free from injury will improve Outcome: Not Progressing   Problem: Skin Integrity: Goal: Risk for impaired skin integrity will decrease Outcome: Not Progressing   

## 2022-02-11 NOTE — Progress Notes (Signed)
PT Cancellation Note ? ?Patient Details ?Name: Tamara Russell ?MRN: [id number] ?DOB: 12-22-1960 ? ? ?Cancelled Treatment:    Reason Eval/Treat Not Completed: Patient at procedure or test/unavailable ? ?With Psychiatry. Will return to attempt evaluation ? ? ?Jerolyn Center, PT ?Acute Rehabilitation Services  ?Pager [phone] ?Office [phone] ? ?Tamara Russell ?02/11/2022, 10:18 AM ?

## 2022-02-11 NOTE — Progress Notes (Signed)
Patient is very agitated and combative at this time. Patient is also in mittens. This RT believes it is a safety risk to try and place patient on BIPAP tonight. I also believe in patient's current state it would make her more combative and agitated. Told RN to call if she calmed down.  ?

## 2022-02-11 NOTE — Progress Notes (Signed)
RT note. ?Patient currently on 2L [place] sat 97% with no labored breathing noted at this time. Bipap not required at this time, RT will continue to monitor.  ?

## 2022-02-11 NOTE — Evaluation (Signed)
Occupational Therapy Evaluation ?Patient Details ?Name: Tamara Russell ?MRN: [id number] ?DOB: [birth year] ?Today's Date: 02/11/2022 ? ? ?History of Present Illness 61 y.o. female who presents after being found altered this morning sitting on the porch. Patient had just recently been hospitalized from 4/19-4/23 after presenting for altered mental status. MRI negative; Psychiatry consult due to recent home stressors and pt with hallucinations; +delirium  PMH significant of hypertension, reformed smoker, recently hypoxic and started on home O2, and obesity  ? ?Clinical Impression ?  ?PTA, pt's family reports she was independent with ADL and functional mobility. Pt oriented to person only this session. She demonstrates cognitive limitations significantly impacting her safety and independence with ADL. Pt requires minA+2 for sit<>stand from EOB and to take lateral side steps/march in place. Pt will continue to benefit from skilled OT services to maximize safety and independence with ADL/IADL and functional mobility. Will continue to follow acutely and progress as tolerated. Should pt d/c home she will require 24/7 assistance from family, HHOT, w/c, RW, tub transfer bench and 3in1 BSC. Will continue to follow acutely.  ?  ?   ? ?Recommendations for follow up therapy are one component of a multi-disciplinary discharge planning process, led by the attending physician.  Recommendations may be updated based on patient status, additional functional criteria and insurance authorization.  ? ?Follow Up Recommendations ? Skilled nursing-short term rehab (<3 hours/day)  ?  ?Assistance Recommended at Discharge Frequent or constant Supervision/Assistance  ?Patient can return home with the following Two people to help with walking and/or transfers;A little help with walking and/or transfers;A lot of help with bathing/dressing/bathroom;Direct supervision/assist for financial management;Direct supervision/assist for medications  management ? ?  ?Functional Status Assessment ? Patient has had a recent decline in their functional status and demonstrates the ability to make significant improvements in function in a reasonable and predictable amount of time.  ?Equipment Recommendations ? Tub/shower seat  ?  ?Recommendations for Other Services   ? ? ?  ?Precautions / Restrictions Precautions ?Precautions: Fall;Other (comment) ?Precaution Comments: watch O2 sats ?Restrictions ?Weight Bearing Restrictions: No  ? ?  ? ?Mobility Bed Mobility ?Overal bed mobility: Needs Assistance ?Bed Mobility: Supine to Sit, Sit to Supine ?  ?  ?Supine to sit: Min guard ?Sit to supine: Min guard ?  ?General bed mobility comments: Pt required significantly increased time and effort with cues for sequencing ?  ? ?Transfers ?Overall transfer level: Needs assistance ?Equipment used: 2 person hand held assist ?Transfers: Sit to/from Stand ?Sit to Stand: Min assist, +2 safety/equipment ?  ?  ?  ?  ?  ?General transfer comment: repeated ~4 reps with improvement/less assist needed each rep ?  ? ?  ?Balance Overall balance assessment: Needs assistance ?Sitting-balance support: Bilateral upper extremity supported, Feet supported ?Sitting balance-Leahy Scale: Poor ?Sitting balance - Comments: poor attention with incr left lean ?  ?Standing balance support: Bilateral upper extremity supported ?Standing balance-Leahy Scale: Poor ?Standing balance comment: bil UE support for standing ?  ?  ?  ?  ?  ?  ?  ?  ?  ?  ?  ?   ? ?ADL either performed or assessed with clinical judgement  ? ?ADL Overall ADL's : Needs assistance/impaired ?Eating/Feeding: Set up;Cueing for sequencing ?  ?Grooming: Minimal assistance;Sitting ?  ?Upper Body Bathing: Moderate assistance;Sitting ?  ?Lower Body Bathing: Maximal assistance ?  ?Upper Body Dressing : Moderate assistance ?Upper Body Dressing Details (indicate cue type and reason): modA to don/doff new gown ?  Lower Body Dressing: Moderate  assistance ?Lower Body Dressing Details (indicate cue type and reason): to access bilateral feet and don clothing ?Toilet Transfer: Minimal assistance;+2 for physical assistance;+2 for safety/equipment ?Toilet Transfer Details (indicate cue type and reason): simulated taking lateral side steps along EOB ?Toileting- Clothing Manipulation and Hygiene: Maximal assistance ?Toileting - Clothing Manipulation Details (indicate cue type and reason): per clinical judgement, pt would require maxA for hygiene ?  ?  ?Functional mobility during ADLs: Minimal assistance;+2 for physical assistance;+2 for safety/equipment ?General ADL Comments: 2 person hand held assistance to take lateral side steps along EOB and march in place. Pt limited this date secondary to decreased activity tolerance, instability and cognition  ? ? ? ?Vision   ?   ?   ?Perception   ?  ?Praxis   ?  ? ?Pertinent Vitals/Pain Pain Assessment ?Pain Assessment: No/denies pain  ? ? ? ?Hand Dominance Right ?  ?Extremity/Trunk Assessment Upper Extremity Assessment ?Upper Extremity Assessment: Generalized weakness ?  ?Lower Extremity Assessment ?Lower Extremity Assessment: Defer to PT evaluation ?  ?Cervical / Trunk Assessment ?Cervical / Trunk Assessment: Other exceptions ?Cervical / Trunk Exceptions: obesity ?  ?Communication Communication ?Communication: No difficulties ?  ?Cognition Arousal/Alertness: Awake/alert ?Behavior During Therapy: [facility] for tasks assessed/performed, Flat affect ?Overall Cognitive Status: Impaired/Different from baseline ?Area of Impairment: Safety/judgement, Awareness, Following commands, Attention ?  ?  ?  ?  ?  ?  ?  ?  ?  ?Current Attention Level: Sustained, Focused ?  ?Following Commands: Follows one step commands with increased time (and repetition) ?Safety/Judgement: Decreased awareness of deficits, Decreased awareness of safety ?Awareness:  (pre-intellectual) ?Problem Solving: Slow processing, Requires verbal cues, Difficulty  sequencing, Requires tactile cues ?General Comments: Patient requires incr time due to delayed processing and poor attention--required repetition of instructions/requests repeatedly. At end of session had period of ~2 minutes where she repeated same phrase over and over again and could not get her to stop and attend to OT. Ultimately pt stopped on her own and then more alert ?  ?  ?General Comments  sats on 4L on arrival 86%; educated in pursed lip breathing and slowly incr to 90%; sat on EOB and again dropping to 85% and not improving with incr to 6L with sats 90%; by end of session, pt back in bed on 4L with sats 89% ? ?  ?Exercises   ?  ?Shoulder Instructions    ? ? ?Home Living Family/patient expects to be discharged to:: Private residence ?Living Arrangements: Spouse/significant other;Children ?Available Help at Discharge: Family;Available 24 hours/day ?Type of Home: House ?Home Access: Stairs to enter ?Entrance Stairs-Number of Steps: 2 ?Entrance Stairs-Rails: None ?Home Layout: One level ?  ?  ?Bathroom Shower/Tub: Tub/shower unit ?  ?Bathroom Toilet: Standard (Can push from sink) ?  ?  ?Home Equipment: None ?  ?  ?  ? ?  ?Prior Functioning/Environment Prior Level of Function : Independent/Modified Independent ?  ?  ?  ?  ?  ?  ?Mobility Comments: pt unable to report; husband reports independent prior to illness ?ADLs Comments: per family report, pt was independent PTA ?  ? ?  ?  ?OT Problem List: Decreased activity tolerance;Impaired balance (sitting and/or standing);Decreased safety awareness;Decreased cognition;Cardiopulmonary status limiting activity ?  ?   ?OT Treatment/Interventions: Self-care/ADL training;Therapeutic exercise;Therapeutic activities;Cognitive remediation/compensation;Energy conservation;Patient/family education;DME and/or AE instruction;Balance training  ?  ?OT Goals(Current goals can be found in the care plan section) Acute Rehab OT Goals ?Patient Stated Goal: pt did  not state ?OT Goal  Formulation: Patient unable to participate in goal setting ?Time For Goal Achievement: 02/25/22 ?Potential to Achieve Goals: Good ?ADL Goals ?Pt Will Perform Grooming: with supervision;standing ?Pt Will Perform Lower Body Dr

## 2022-02-11 NOTE — Progress Notes (Signed)
?      ?                 PROGRESS NOTE ? ?      ?PATIENT DETAILS ?Name: Tamara Russell ?Age: 61 y.o. ?Sex: female ?Date of Birth: [birth year] ?Admit Date: 02/06/2022 ?Admitting Physician Clydie Braun, MD ?HUT:MLYYT, Adrian Saran, MD ? ?Brief Summary: ?Patient is a 61 y.o.  female morbid obesity, HTN, GERD-who was just discharged from this facility on 4/23-presented to the hospital on 4/24 with altered mental status. ? ?Significant events: ?4/19-4/23>> hospitalization for chest pain, confusion-UTI.  Status stable at discharge. ?4/24> presented to the ED for confusion-admit to [facility]. ? ?Significant studies: ?4/20>> HIV: Negative ?4/20>> TSH: Not elevated ?4/24>> CT head: No acute intracranial abnormality ?4/24>> CXR: No PNA ?4/24>> RPR: Nonreactive ?4/24>> NH4: Negative ?4/24>> alcohol level: 65 ?4/26>> EEG: No seizures ?4/26>> vitamin B12: 2964 ?4/27>> acute hepatitis serology: Negative ? ?Significant microbiology data: ?4/20>> urine culture: E. coli/Citrobacter ?4/24>> COVID/influenza PCR: Negative ?4/24>> urine culture: Enterococcus faecalis ?4/24>> blood culture: Negative ? ?Procedures: ?None ? ?Consults: ?Neurology, psychiatry ? ?Subjective: ?Uneventful night-although answers simple questions appropriately-is still confused.  Still not yet at baseline.  Spouse at bedside. ? ?Objective: ?Vitals: ?Blood pressure 132/84, pulse 76, temperature 98 ?F (36.7 ?C), temperature source Oral, resp. rate 17, SpO2 97 %.  ? ?Exam: ?Gen Exam:not in any distress ?HEENT:atraumatic, normocephalic ?Chest: B/L clear to auscultation anteriorly ?CVS:S1S2 regular ?Abdomen:soft non tender, non distended ?Extremities:no edema ?Neurology: Non focal ?Skin: no rash  ? ?Pertinent Labs/Radiology: ? ?  Latest Ref Rng & Units 02/09/2022  ? 12:33 AM 02/08/2022  ?  1:07 AM 02/07/2022  ?  4:00 AM  ?CBC  ?WBC 4.0 - 10.5 K/uL 4.5   4.2   4.3    ?Hemoglobin 12.0 - 15.0 g/dL 03.5   46.5   68.1    ?Hematocrit 36.0 - 46.0 % 46.9   44.6   49.8    ?Platelets 150  - 400 K/uL 259   239   292    ?  ?Lab Results  ?Component Value Date  ? NA 133 (L) 02/11/2022  ? K 3.6 02/11/2022  ? CL 88 (L) 02/11/2022  ? CO2 37 (H) 02/11/2022  ? ?  ? ? ?Assessment/Plan: ?Acute metabolic encephalopathy: Unclear etiology-extensive work-up negative-continues to be confused in spite of of use of BiPAP for hypercarbia and antibiotics for possible UTI (more likely asymptomatic bacteriuria).  Appreciate neurology input-psych following-seems to have tolerated scheduled Haldol without any issues last night.  Unfortunately mentation not yet improved-we will await further recommendations from psychiatry.  ? ?Asymptomatic bacteriuria: Suspect urine culture results more suggestive of asymptomatic bacteriuria rather than a UTI. Patient had 2 different organisms in urine culture within a week in spite of treatment.  She was initially on Rocephin-plans were to start ampicillin but she has a documented allergy to PCN-hence after discussion with pharmacy- was given her 1 dose of fosfomycin.   ? ?Chronic hypoxic respiratory failure: Per family-patient was discharged on home O2 during her most recent hospitalization. ? ?OSA: initially refused BiPAP-now agreeable and has been compliant. ? ?Hyponatremia: Minimal/mild-doubt this is of any clinical significance. ? ?Chronic HFpEF: Volume status stable-on Aldactone. ? ?HTN: BP stable on metoprolol and Aldactone. ? ?HLD: Statin ? ?Morbid Obesity: ?Estimated body mass index is 40.74 kg/m? as calculated from the following: ?  Height as of 11/21/21: 5\' 1"  (1.549 m). ?  Weight as of 02/05/22: 97.8 kg.  ? ?Code status: ?  Code Status: Full Code  ? ?DVT Prophylaxis: Prophylactic Lovenox  ? ?Family Communication: Spouse  at bedside. ? ? ?Disposition Plan: ?Status is: Inpatient ?Remains inpatient appropriate because: Encephalopathy-not yet at baseline-work-up ongoing.  Not yet stable for discharge. ?  ?Planned Discharge Destination:Home ? ? ?Diet: ?Diet Order   ? ?       ?  Diet  Heart Room service appropriate? Yes; Fluid consistency: Thin  Diet effective now       ?  ? ?  ?  ? ?  ?  ? ? ?Antimicrobial agents: ?Anti-infectives (From admission, onward)  ? ? Start     Dose/Rate Route Frequency Ordered Stop  ? 02/08/22 1530  fosfomycin (MONUROL) packet 3 g       ? 3 g Oral  Once 02/08/22 1430 02/08/22 1548  ? 02/07/22 1500  cefTRIAXone (ROCEPHIN) 1 g in sodium chloride 0.9 % 100 mL IVPB  Status:  Discontinued       ? 1 g ?200 mL/hr over 30 Minutes Intravenous Every 24 hours 02/07/22 1344 02/08/22 1412  ? 02/07/22 1200  cefTRIAXone (ROCEPHIN) 1 g in sodium chloride 0.9 % 100 mL IVPB  Status:  Discontinued       ? 1 g ?200 mL/hr over 30 Minutes Intravenous Every 24 hours 02/06/22 1328 02/06/22 1729  ? 02/06/22 1800  cefTRIAXone (ROCEPHIN) 1 g in sodium chloride 0.9 % 100 mL IVPB       ? 1 g ?200 mL/hr over 30 Minutes Intravenous  Once 02/06/22 1729 02/06/22 1938  ? ?  ? ? ? ?MEDICATIONS: ?Scheduled Meds: ? aspirin EC  81 mg Oral Daily  ? atorvastatin  40 mg Oral QHS  ? enoxaparin (LOVENOX) injection  0.5 mg/kg Subcutaneous Q24H  ? haloperidol  5 mg Oral QHS  ? hydrocortisone  25 mg Rectal BID  ? melatonin  3 mg Oral q1800  ? metoprolol succinate  12.5 mg Oral q morning  ? sodium chloride flush  3 mL Intravenous Q12H  ? spironolactone  25 mg Oral Daily  ? ?Continuous Infusions: ? thiamine injection 500 mg (02/10/22 1749)  ? ? ?PRN Meds:.acetaminophen **OR** acetaminophen, albuterol ? ? ?I have personally reviewed following labs and imaging studies ? ?LABORATORY DATA: ?CBC: ?Recent Labs  ?Lab 02/06/22 ?0852 02/06/22 ?0903 02/06/22 ?1255 02/06/22 ?1351 02/07/22 ?0400 02/08/22 ?0107 02/09/22 ?8657  ?WBC 4.1  --   --   --  4.3 4.2 4.5  ?NEUTROABS 3.0  --   --   --   --  2.8  --   ?HGB 15.2*   < > 16.0* 17.0* 14.6 13.6 13.8  ?HCT 49.5*   < > 47.0* 50.0* 49.8* 44.6 46.9*  ?MCV 77.8*  --   --   --  78.8* 78.2* 78.0*  ?PLT 269  --   --   --  292 239 259  ? < > = values in this interval not displayed.   ? ? ? ?Basic Metabolic Panel: ?Recent Labs  ?Lab 02/06/22 ?0852 02/06/22 ?0903 02/06/22 ?1351 02/07/22 ?0400 02/08/22 ?0107 02/09/22 ?8469 02/11/22 ?0052  ?NA 136   < > 132* 135 133* 137 133*  ?K 3.3*   < > 3.6 3.7 3.5 3.9 3.6  ?CL 83*  --   --  87* 85* 88* 88*  ?CO2 41*  --   --  38* 39* 38* 37*  ?GLUCOSE 89  --   --  106* 107* 95 98  ?BUN 13  --   --  14 10 10  6*  ?CREATININE 0.71  --   --  0.71 0.47 0.56 0.54  ?CALCIUM 9.4  --   --  9.5 8.9 9.5 9.1  ?MG  --   --   --  2.0  --   --   --   ? < > = values in this interval not displayed.  ? ? ? ?GFR: ?Estimated Creatinine Clearance: 77 mL/min (by C-G formula based on SCr of 0.54 mg/dL). ? ?Liver Function Tests: ?Recent Labs  ?Lab 02/06/22 ?0852 02/09/22 ?[id number]  ?AST 28 26  ?ALT 18 20  ?ALKPHOS 47 49  ?BILITOT 0.6 0.7  ?PROT 6.3* 6.4*  ?ALBUMIN 3.1* 3.0*  ? ? ?No results for input(s): LIPASE, AMYLASE in the last 168 hours. ? ?Recent Labs  ?Lab 02/06/22 ?0851  ?AMMONIA 20  ? ? ? ?Coagulation Profile: ?No results for input(s): INR, PROTIME in the last 168 hours. ? ?Cardiac Enzymes: ?No results for input(s): CKTOTAL, CKMB, CKMBINDEX, TROPONINI in the last 168 hours. ? ?BNP (last 3 results) ?No results for input(s): PROBNP in the last 8760 hours. ? ?Lipid Profile: ?No results for input(s): CHOL, HDL, LDLCALC, TRIG, CHOLHDL, LDLDIRECT in the last 72 hours. ? ?Thyroid Function Tests: ?No results for input(s): TSH, T4TOTAL, FREET4, T3FREE, THYROIDAB in the last 72 hours. ? ?Anemia Panel: ?Recent Labs  ?  02/08/22 ?1517  ?[id number] 2,964*  ? ? ? ?Urine analysis: ?   ?Component Value Date/Time  ? COLORURINE YELLOW 02/06/2022 1425  ? APPEARANCEUR CLEAR 02/06/2022 1425  ? LABSPEC 1.023 02/06/2022 1425  ? PHURINE 7.0 02/06/2022 1425  ? GLUCOSEU NEGATIVE 02/06/2022 1425  ? HGBUR NEGATIVE 02/06/2022 1425  ? BILIRUBINUR NEGATIVE 02/06/2022 1425  ? KETONESUR 80 (A) 02/06/2022 1425  ? PROTEINUR NEGATIVE 02/06/2022 1425  ? NITRITE NEGATIVE 02/06/2022 1425  ? LEUKOCYTESUR NEGATIVE  02/06/2022 1425  ? ? ?Sepsis Labs: ?Lactic Acid, Venous ?   ?Component Value Date/Time  ? LATICACIDVEN 1.2 11/20/2015 0600  ? ? ?MICROBIOLOGY: ?Recent Results (from the past 240 hour(s))  ?Urine Culture     [facility]

## 2022-02-11 NOTE — Evaluation (Signed)
Physical Therapy Evaluation Patient Details Name: Tamara Russell MRN: [id number] DOB: 1961-07-16 Today's Date: 02/11/2022  History of Present Illness  61 y.o. female who presents after being found altered this morning sitting on the porch. Patient had just recently been hospitalized from 4/19-4/23 after presenting for altered mental status. MRI negative; Psychiatry consult due to recent home stressors and pt with hallucinations; +delirium  PMH significant of hypertension, reformed smoker, recently hypoxic and started on home O2, and obesity  Clinical Impression   Pt admitted secondary to problem above with deficits below. Prior to recent admission, pt was fully independent.  Pt currently requires +2 assist for standing and is unable/unsafe to progress to ambulation (barely lifted feet x 3 reps when asked to march and sat down--repeated this twice). If cognition improves, she may be able to go home with family support.  Anticipate patient will benefit from PT to address problems listed below.Will continue to follow acutely to maximize functional mobility independence and safety.          Recommendations for follow up therapy are one component of a multi-disciplinary discharge planning process, led by the attending physician.  Recommendations may be updated based on patient status, additional functional criteria and insurance authorization.  Follow Up Recommendations Skilled nursing-short term rehab (<3 hours/day)    Assistance Recommended at Discharge Frequent or constant Supervision/Assistance  Patient can return home with the following  Assistance with cooking/housework;A lot of help with walking and/or transfers;Direct supervision/assist for medications management;Direct supervision/assist for financial management;Help with stairs or ramp for entrance;Assist for transportation    Equipment Recommendations None recommended by PT  Recommendations for Other Services       Functional  Status Assessment Patient has had a recent decline in their functional status and demonstrates the ability to make significant improvements in function in a reasonable and predictable amount of time.     Precautions / Restrictions Precautions Precautions: Fall;Other (comment) Precaution Comments: watch O2 sats Restrictions Weight Bearing Restrictions: No      Mobility  Bed Mobility Overal bed mobility: Needs Assistance Bed Mobility: Supine to Sit, Sit to Supine     Supine to sit: Min guard Sit to supine: Min guard   General bed mobility comments: Pt required significantly increased time and effort with cues for sequencing    Transfers Overall transfer level: Needs assistance Equipment used: 2 person hand held assist Transfers: Sit to/from Stand Sit to Stand: Min assist, +2 safety/equipment           General transfer comment: repeated ~4 reps with improvement/less assist needed each rep    Ambulation/Gait             Pre-gait activities: pt with poor ability to march in place and returned to sitting on EOB without warning. Further ambulation deferred    Stairs            Wheelchair Mobility    Modified Rankin (Stroke Patients Only)       Balance Overall balance assessment: Needs assistance Sitting-balance support: Bilateral upper extremity supported, Feet supported Sitting balance-Leahy Scale: Poor Sitting balance - Comments: poor attention with incr left lean   Standing balance support: Bilateral upper extremity supported Standing balance-Leahy Scale: Poor Standing balance comment: bil UE support for standing                             Pertinent Vitals/Pain Pain Assessment Pain Assessment: No/denies pain    Home  Living Family/patient expects to be discharged to:: Private residence Living Arrangements: Spouse/significant other;Children Available Help at Discharge: Family;Available 24 hours/day Type of Home: House Home Access:  Stairs to enter Entrance Stairs-Rails: None Entrance Stairs-Number of Steps: 2   Home Layout: One level Home Equipment: None      Prior Function Prior Level of Function : Independent/Modified Independent             Mobility Comments: pt unable to report; husband reports independent prior to illness       Hand Dominance   Dominant Hand: Right    Extremity/Trunk Assessment   Upper Extremity Assessment Upper Extremity Assessment: Defer to OT evaluation    Lower Extremity Assessment Lower Extremity Assessment: Generalized weakness    Cervical / Trunk Assessment Cervical / Trunk Assessment: Other exceptions Cervical / Trunk Exceptions: obesity  Communication   Communication: No difficulties  Cognition Arousal/Alertness: Awake/alert Behavior During Therapy: WFL for tasks assessed/performed, Flat affect Overall Cognitive Status: Impaired/Different from baseline Area of Impairment: Safety/judgement, Awareness, Following commands, Attention                   Current Attention Level: Sustained, Focused   Following Commands: Follows one step commands with increased time (and repetition) Safety/Judgement: Decreased awareness of deficits, Decreased awareness of safety Awareness:  (pre-intellectual) Problem Solving: Slow processing, Requires verbal cues, Difficulty sequencing, Requires tactile cues General Comments: Patient requires incr time due to delayed processing and poor attention--required repetition of instructions/requests repeatedly. At end of session had period of ~2 minutes where she repeated same phrase over and over again and could not get her to stop and attend to OT. Ultimately pt stopped on her own and then more alert        General Comments General comments (skin integrity, edema, etc.): sats on 4L on arrival 86%; educated in pursed lip breathing and slowly incr to 90%; sat on EOB and again dropping to 85% and not improving with incr to 6L with sats  90%; by end of session, pt back in bed on 4L with sats 89%    Exercises     Assessment/Plan    PT Assessment Patient needs continued PT services  PT Problem List Decreased strength;Decreased activity tolerance;Decreased balance;Decreased mobility;Decreased cognition;Decreased knowledge of use of DME;Decreased safety awareness;Decreased knowledge of precautions;Cardiopulmonary status limiting activity;Obesity       PT Treatment Interventions DME instruction;Gait training;Stair training;Functional mobility training;Therapeutic activities;Therapeutic exercise;Balance training;Cognitive remediation;Patient/family education    PT Goals (Current goals can be found in the Care Plan section)  Acute Rehab PT Goals Patient Stated Goal: unable to state PT Goal Formulation: With family Time For Goal Achievement: 02/25/22 Potential to Achieve Goals: Good    Frequency Min 3X/week (may go home)     Co-evaluation               AM-PAC PT "6 Clicks" Mobility  Outcome Measure Help needed turning from your back to your side while in a flat bed without using bedrails?: A Little Help needed moving from lying on your back to sitting on the side of a flat bed without using bedrails?: A Little Help needed moving to and from a bed to a chair (including a wheelchair)?: Total Help needed standing up from a chair using your arms (e.g., wheelchair or bedside chair)?: Total Help needed to walk in hospital room?: Total Help needed climbing 3-5 steps with a railing? : Total 6 Click Score: 10    End of Session Equipment Utilized During  Treatment: Oxygen Activity Tolerance: Patient limited by fatigue Patient left: with call bell/phone within reach;with family/visitor present;in bed;with bed alarm set Nurse Communication: Mobility status PT Visit Diagnosis: Other abnormalities of gait and mobility (R26.89);Muscle weakness (generalized) (M62.81)    Time: 0981-1914 PT Time Calculation (min) (ACUTE  ONLY): 26 min   Charges:   PT Evaluation $PT Eval Low Complexity: 1 Low           Jerolyn Center, PT Acute Rehabilitation Services  Pager [phone] Office [phone]   Zena Amos 02/11/2022, 2:50 PM

## 2022-02-12 DIAGNOSIS — I5189 Other ill-defined heart diseases: Secondary | ICD-10-CM | POA: Diagnosis not present

## 2022-02-12 DIAGNOSIS — G9341 Metabolic encephalopathy: Secondary | ICD-10-CM | POA: Diagnosis not present

## 2022-02-12 DIAGNOSIS — J9601 Acute respiratory failure with hypoxia: Secondary | ICD-10-CM | POA: Diagnosis not present

## 2022-02-12 DIAGNOSIS — R778 Other specified abnormalities of plasma proteins: Secondary | ICD-10-CM | POA: Diagnosis not present

## 2022-02-12 LAB — VITAMIN B1: Vitamin B1 (Thiamine): 133.6 nmol/L (ref 66.5–200.0)

## 2022-02-12 MED ORDER — HYDROCORTISONE ACETATE 25 MG RE SUPP
25.0000 mg | Freq: Two times a day (BID) | RECTAL | Status: DC | PRN
  Filled 2022-02-12: qty 1

## 2022-02-12 MED ORDER — OLANZAPINE 5 MG PO TBDP
15.0000 mg | ORAL_TABLET | Freq: Every day | ORAL | Status: DC
  Administered 2022-02-12: 15 mg via ORAL
  Filled 2022-02-12: qty 3

## 2022-02-12 MED ORDER — THIAMINE HCL 100 MG PO TABS
100.0000 mg | ORAL_TABLET | Freq: Every day | ORAL | Status: DC
  Administered 2022-02-12: 100 mg via ORAL
  Filled 2022-02-12 (×5): qty 1

## 2022-02-12 NOTE — Progress Notes (Signed)
?      ?                 PROGRESS NOTE ? ?      ?PATIENT DETAILS ?Name: Tamara Russell ?Age: 61 y.o. ?Sex: female ?Date of Birth: 03-Nov-1960 ?Admit Date: 02/06/2022 ?Admitting Physician Clydie Braun, MD ?OEV:OJJKK, Adrian Saran, MD ? ?Brief Summary: ?Patient is a 61 y.o.  female morbid obesity, HTN, GERD-who was just discharged from this facility on 4/23-presented to the hospital on 4/24 with altered mental status. ? ?Significant events: ?4/19-4/23>> hospitalization for chest pain, confusion-UTI.  Status stable at discharge. ?4/24> presented to the ED for confusion-admit to [facility]. ? ?Significant studies: ?4/20>> HIV: Negative ?4/20>> TSH: Not elevated ?4/24>> CT head: No acute intracranial abnormality ?4/24>> CXR: No PNA ?4/24>> RPR: Nonreactive ?4/24>> NH4: Negative ?4/24>> alcohol level: 65 ?4/26>> EEG: No seizures ?4/26>> vitamin B12: 2964 ?4/27>> acute hepatitis serology: Negative ? ?Significant microbiology data: ?4/20>> urine culture: E. coli/Citrobacter ?4/24>> COVID/influenza PCR: Negative ?4/24>> urine culture: Enterococcus faecalis ?4/24>> blood culture: Negative ? ?Procedures: ?None ? ?Consults: ?Neurology, psychiatry ? ?Subjective: ?Calm and quiet when I was in the room but per nursing staff-agitated-using profanities-refusing applesauce/medications etc.  Had some delirium last night as well. ? ?Objective: ?Vitals: ?Blood pressure (!) 148/86, pulse 100, temperature 98.3 ?F (36.8 ?C), temperature source Oral, resp. rate 18, SpO2 97 %.  ? ?Exam: ?Gen Exam:not in any distress ?HEENT:atraumatic, normocephalic ?Chest: B/L clear to auscultation anteriorly ?CVS:S1S2 regular ?Abdomen:soft non tender, non distended ?Extremities:no edema ?Neurology: Non focal ?Skin: no rash  ? ?Pertinent Labs/Radiology: ? ?  Latest Ref Rng & Units 02/09/2022  ? 12:33 AM 02/08/2022  ?  1:07 AM 02/07/2022  ?  4:00 AM  ?CBC  ?WBC 4.0 - 10.5 K/uL 4.5   4.2   4.3    ?Hemoglobin 12.0 - 15.0 g/dL 93.8   18.2   99.3    ?Hematocrit 36.0 - 46.0  % 46.9   44.6   49.8    ?Platelets 150 - 400 K/uL 259   239   292    ?  ?Lab Results  ?Component Value Date  ? NA 133 (L) 02/11/2022  ? K 3.6 02/11/2022  ? CL 88 (L) 02/11/2022  ? CO2 37 (H) 02/11/2022  ? ?  ? ? ?Assessment/Plan: ?Acute metabolic encephalopathy: Unclear etiology-extensive work-up negative-continues to be confused in spite of of use of BiPAP for hypercarbia and antibiotics for possible UTI (more likely asymptomatic bacteriuria).  Neurology evaluation appreciated-suspicion for psychogenic etiology at this point given numerous stressors at home.  Psych following-started on Zyprexa yesterday-unfortunately continues to have issues with agitation/combativeness/delirium.  Await further recommendations from psychiatry. ? ?Asymptomatic bacteriuria: Suspect urine culture results more suggestive of asymptomatic bacteriuria rather than a UTI. Patient had 2 different organisms in urine culture within a week in spite of treatment.  She was initially on Rocephin-plans were to start ampicillin but she has a documented allergy to PCN-hence after discussion with pharmacy- was given her 1 dose of fosfomycin.   ? ?Chronic hypoxic respiratory failure: Per family-patient was discharged on home O2 during her most recent hospitalization. ? ?OSA: initially refused BiPAP-now agreeable and has been compliant. ? ?Hyponatremia: Minimal/mild-doubt this is of any clinical significance. ? ?Chronic HFpEF: Volume status stable-on Aldactone. ? ?HTN: BP stable on metoprolol and Aldactone. ? ?HLD: Statin ? ?Debility/deconditioning: Due to acute illness-recommendations from PT are for SNF. ? ?Morbid Obesity: ?Estimated body mass index is 40.74 kg/m? as calculated from the following: ?  Height as of 11/21/21: 5\' 1"  (1.549 m). ?  Weight as of 02/05/22: 97.8 kg.  ? ?Code status: ?  Code Status: Full Code  ? ?DVT Prophylaxis: Prophylactic Lovenox  ? ?Family Communication: Spouse/daughter at bedside. ? ? ?Disposition Plan: ?Status is:  Inpatient ?Remains inpatient appropriate because: Encephalopathy-not yet at baseline-work-up ongoing.  Not yet stable for discharge. ?  ?Planned Discharge Destination: SNF ? ? ?Diet: ?Diet Order   ? ?       ?  Diet Heart Room service appropriate? Yes; Fluid consistency: Thin  Diet effective now       ?  ? ?  ?  ? ?  ?  ? ? ?Antimicrobial agents: ?Anti-infectives (From admission, onward)  ? ? Start     Dose/Rate Route Frequency Ordered Stop  ? 02/08/22 1530  fosfomycin (MONUROL) packet 3 g       ? 3 g Oral  Once 02/08/22 1430 02/08/22 1548  ? 02/07/22 1500  cefTRIAXone (ROCEPHIN) 1 g in sodium chloride 0.9 % 100 mL IVPB  Status:  Discontinued       ? 1 g ?200 mL/hr over 30 Minutes Intravenous Every 24 hours 02/07/22 1344 02/08/22 1412  ? 02/07/22 1200  cefTRIAXone (ROCEPHIN) 1 g in sodium chloride 0.9 % 100 mL IVPB  Status:  Discontinued       ? 1 g ?200 mL/hr over 30 Minutes Intravenous Every 24 hours 02/06/22 1328 02/06/22 1729  ? 02/06/22 1800  cefTRIAXone (ROCEPHIN) 1 g in sodium chloride 0.9 % 100 mL IVPB       ? 1 g ?200 mL/hr over 30 Minutes Intravenous  Once 02/06/22 1729 02/06/22 1938  ? ?  ? ? ? ?MEDICATIONS: ?Scheduled Meds: ? aspirin EC  81 mg Oral Daily  ? atorvastatin  40 mg Oral QHS  ? enoxaparin (LOVENOX) injection  0.5 mg/kg Subcutaneous Q24H  ? hydrocortisone  25 mg Rectal BID  ? melatonin  3 mg Oral q1800  ? metoprolol succinate  12.5 mg Oral q morning  ? OLANZapine  10 mg Oral QHS  ? sodium chloride flush  3 mL Intravenous Q12H  ? spironolactone  25 mg Oral Daily  ? ?Continuous Infusions: ? ? ? ?PRN Meds:.acetaminophen **OR** acetaminophen, albuterol ? ? ?I have personally reviewed following labs and imaging studies ? ?LABORATORY DATA: ?CBC: ?Recent Labs  ?Lab 02/06/22 ?0852 02/06/22 ?0903 02/06/22 ?1255 02/06/22 ?1351 02/07/22 ?0400 02/08/22 ?0107 02/09/22 ?[id number]  ?WBC 4.1  --   --   --  4.3 4.2 4.5  ?NEUTROABS 3.0  --   --   --   --  2.8  --   ?HGB 15.2*   < > 16.0* 17.0* 14.6 13.6 13.8  ?HCT  49.5*   < > 47.0* 50.0* 49.8* 44.6 46.9*  ?MCV 77.8*  --   --   --  78.8* 78.2* 78.0*  ?PLT 269  --   --   --  292 239 259  ? < > = values in this interval not displayed.  ? ? ? ?Basic Metabolic Panel: ?Recent Labs  ?Lab 02/06/22 ?0852 02/06/22 ?0903 02/06/22 ?1351 02/07/22 ?0400 02/08/22 ?0107 02/09/22 ?[id number] 02/11/22 ?0052  ?NA 136   < > 132* 135 133* 137 133*  ?K 3.3*   < > 3.6 3.7 3.5 3.9 3.6  ?CL 83*  --   --  87* 85* 88* 88*  ?CO2 41*  --   --  38* 39* 38* 37*  ?GLUCOSE 89  --   --  106* 107* 95 98  ?BUN 13  --   --  14 10 10  6*  ?CREATININE 0.71  --   --  0.71 0.47 0.56 0.54  ?CALCIUM 9.4  --   --  9.5 8.9 9.5 9.1  ?MG  --   --   --  2.0  --   --   --   ? < > = values in this interval not displayed.  ? ? ? ?GFR: ?Estimated Creatinine Clearance: 77 mL/min (by C-G formula based on SCr of 0.54 mg/dL). ? ?Liver Function Tests: ?Recent Labs  ?Lab 02/06/22 ?0852 02/09/22 ?02/11/22  ?AST 28 26  ?ALT 18 20  ?ALKPHOS 47 49  ?BILITOT 0.6 0.7  ?PROT 6.3* 6.4*  ?ALBUMIN 3.1* 3.0*  ? ? ?No results for input(s): LIPASE, AMYLASE in the last 168 hours. ? ?Recent Labs  ?Lab 02/06/22 ?0851  ?AMMONIA 20  ? ? ? ?Coagulation Profile: ?No results for input(s): INR, PROTIME in the last 168 hours. ? ?Cardiac Enzymes: ?No results for input(s): CKTOTAL, CKMB, CKMBINDEX, TROPONINI in the last 168 hours. ? ?BNP (last 3 results) ?No results for input(s): PROBNP in the last 8760 hours. ? ?Lipid Profile: ?No results for input(s): CHOL, HDL, LDLCALC, TRIG, CHOLHDL, LDLDIRECT in the last 72 hours. ? ?Thyroid Function Tests: ?No results for input(s): TSH, T4TOTAL, FREET4, T3FREE, THYROIDAB in the last 72 hours. ? ?Anemia Panel: ?No results for input(s): VITAMINB12, FOLATE, FERRITIN, TIBC, IRON, RETICCTPCT in the last 72 hours. ? ? ?Urine analysis: ?   ?Component Value Date/Time  ? COLORURINE YELLOW 02/06/2022 1425  ? APPEARANCEUR CLEAR 02/06/2022 1425  ? LABSPEC 1.023 02/06/2022 1425  ? PHURINE 7.0 02/06/2022 1425  ? GLUCOSEU NEGATIVE 02/06/2022  1425  ? HGBUR NEGATIVE 02/06/2022 1425  ? BILIRUBINUR NEGATIVE 02/06/2022 1425  ? KETONESUR 80 (A) 02/06/2022 1425  ? PROTEINUR NEGATIVE 02/06/2022 1425  ? NITRITE NEGATIVE 02/06/2022 1425  ? LEUKOCYTESUR NEGATIVE

## 2022-02-12 NOTE — Progress Notes (Signed)
Pt's husband is at the bedside and requested to see RN. Upon RN's arrival to the room, patient was found to be agitated, trying to get out of bed, removing pulse ox from the finger, and trying to pull leads off. RN tried to re-orient the pt. Pt started yelling " GET OFF, DO NOT TOUCH ME". RN notified provider Carollee Herter DO.  ?

## 2022-02-12 NOTE — Consult Note (Signed)
[facility] Face-to-Face Psychiatry Consult  ? ?Reason for Consult:  AMS and psychosis ?Referring Physician:  Dr. Jerral Ralph ?Patient Identification: Tamara Russell ?MRN:  [id number] ?Principal Diagnosis: Acute metabolic encephalopathy ?Diagnosis:  Principal Problem: ?  Acute metabolic encephalopathy ?Active Problems: ?  Acute respiratory failure with hypoxia and hypercapnia (HCC) ?  Hypertension ?  PUD (peptic ulcer disease) ?  Elevated troponin ?  History of UTI ?  Hypokalemia ?  Class 3 severe obesity due to excess calories with serious comorbidity and body mass index (BMI) of 40.0 to 44.9 in adult [facility]) ?  Microcytosis ?  Diastolic dysfunction ? ? ?Total Time spent with patient: 30 minutes ? ?Subjective:   ?Tamara Russell is a 61 y.o. female patient  Admitted on 02/06/2022 for altered mental status and psychosis. Psychiatry consulted for AMS and psychosis.  ?Patient seen and chart reviewed- she was medication compliant with zyprexa 10 mg mg qhs as recommended by psychiatry; however, also received IV haldol 5 mg last night at 23:45. Patient has also been combative with nursing staff this  morning. ? ?On interview, patient is found laying in bed without mitts in place; daughter at bedside attempting to replace nasal cannula on patient which had fallen off. I assisted family member with placing Brady appropriately. Patient is oriented to self, [facility], month and year. She has poor attention throughout and needs questions repeated several times. When she is asked to identify the family member with her today (her daughter), she states "jesus christ". She denies SI/HI/AVH. She is disorganized in her thought process and sings songs intermittently throughout interview. Bcam positive as below ? ? ?Collateral ?Daughter is at bedside this morning. She describes patient's mental state as waxing and waning. She states that she spoke with patient on the phone at approximately 630pm yesterday and she sounded like her  normal self but that when she spoke to her at 8 pm she did not sound like her normal self. She states that patient appears to be hallucinating and talks to [place]. She states that while the patient is someone who believes in Jesus that this is unusual for her. ? ? ? ? ?B-Cam: ?1. Acute change and/or fluctuating course of mental status: yes ?2. Inattention : yes is inattentive- unable to perform this time ?"Can you name the months backwards from Decemeber to July?" ?3. Altered Level of Consciousness: yes ?4. Disorganized Thinking (Errors >1/6): yes- 4/6 errors ?"Will a stone float on water?"  "No" ?"Are there fish in the sea?"  "No" ?"Does one pound weigh more than two?"  "That is undecided" ?"Can you use a hammer to pound a nail?"  "That is undecided" ?Command(s): Unable to perform this successfully- holds up 2 finds on one hand but then only holds up 1 on the other when asked to do the same ?"Hold up 2 fingers." ?"Now do the same thing with the other hand." ? ?Score: 1+2 AND, either 3 or 4 present = Positive b-CAM ? ? ? ? ?HPI :   ?Tamara Russell is a 61 y.o. female patient  Admitted on 02/06/2022 for altered mental status and psychosis. Psychiatry consulted for AMS and psychosis.  ? ? ? ?Past Medical History:  ?Past Medical History:  ?Diagnosis Date  ? Acid reflux   ? Complication of anesthesia   ? slow to wake up  ? History of COVID-19   ? Hypertension   ?  ?Past Surgical History:  ?Procedure Laterality Date  ? ABDOMINAL HYSTERECTOMY    ?  BIOPSY  11/17/2018  ? Procedure: BIOPSY;  Surgeon: Jerene Bears, MD;  Location: [facility] ENDOSCOPY;  Service: Gastroenterology;;  ? BREAST EXCISIONAL BIOPSY Left   ? benign  ? BREAST EXCISIONAL BIOPSY Right   ? benign  ? ESOPHAGOGASTRODUODENOSCOPY (EGD) WITH PROPOFOL N/A 11/17/2018  ? Procedure: ESOPHAGOGASTRODUODENOSCOPY (EGD) WITH PROPOFOL;  Surgeon: Jerene Bears, MD;  Location: [facility] ENDOSCOPY;  Service: Gastroenterology;  Laterality: N/A;  ? ?Family History:  ?Family History   ?Problem Relation Age of Onset  ? Cancer Father 58  ?     stomach  ? Hypertension Father   ? Diabetes Maternal Grandmother   ? Colon cancer Maternal Grandfather   ? Diabetes Paternal Grandmother   ? Colon cancer Paternal Grandmother   ? ?Family history ?No family psychiatric history disclosed.  ?The patient's family history includes Cancer (age of onset: 59) in her father; Colon cancer in her maternal grandfather and paternal grandmother; Diabetes in her maternal grandmother and paternal grandmother; Hypertension in her father. ?  ? ? ?Social History:  ?Social History  ? ?Substance and Sexual Activity  ?Alcohol Use No  ? Alcohol/week: 0.0 standard drinks  ?   ?Social History  ? ?Substance and Sexual Activity  ?Drug Use No  ?  ?Social History  ? ?Socioeconomic History  ? Marital status: Married  ?  Spouse name: Not on file  ? Number of children: Not on file  ? Years of education: Not on file  ? Highest education level: Not on file  ?Occupational History  ? Occupation: unemployed  ?Tobacco Use  ? Smoking status: Former  ?  Packs/day: 0.50  ?  Years: 20.00  ?  Pack years: 10.00  ?  Types: Cigarettes  ?  Start date: 04/30/1975  ?  Quit date: 04/30/1995  ?  Years since quitting: 26.8  ? Smokeless tobacco: Never  ?Vaping Use  ? Vaping Use: Never used  ?Substance and Sexual Activity  ? Alcohol use: No  ?  Alcohol/week: 0.0 standard drinks  ? Drug use: No  ? Sexual activity: Not on file  ?Other Topics Concern  ? Not on file  ?Social History Narrative  ? Not on file  ? ?Social Determinants of Health  ? ?Financial Resource Strain: Not on file  ?Food Insecurity: Not on file  ?Transportation Needs: Not on file  ?Physical Activity: Not on file  ?Stress: Not on file  ?Social Connections: Not on file  ? ?Additional Social History: ?  ? ?Allergies:   ?Allergies  ?Allergen Reactions  ? Penicillins Other (See Comments)  ?  Childhood allergy. Pt does not remember reaction  ? ? ?Labs:  ?Results for orders placed or performed during the  hospital encounter of 02/06/22 (from the past 48 hour(s))  ?Hepatitis panel, acute     Status: None  ? Collection Time: 02/11/22 12:52 AM  ?Result Value Ref Range  ? Hepatitis B Surface Ag NON REACTIVE NON REACTIVE  ? HCV Ab NON REACTIVE NON REACTIVE  ?  Comment: (NOTE) ?Nonreactive HCV antibody screen is consistent with no HCV infections,  ?unless recent infection is suspected or other evidence exists to ?indicate HCV infection. ? ?  ? Hep A IgM NON REACTIVE NON REACTIVE  ? Hep B C IgM NON REACTIVE NON REACTIVE  ?  Comment: Performed at [facility], [place] [address], [place], [place] [address]  ?Ethanol     Status: None  ? Collection Time: 02/11/22 12:52 AM  ?Result Value Ref Range  ?  Alcohol, Ethyl (B) <10 <10 mg/dL  ?  Comment: (NOTE) ?Lowest detectable limit for serum alcohol is 10 mg/dL. ? ?For medical purposes only. ?Performed at [facility], [address], [place], [place] ?[address] ?  ?Basic metabolic panel     Status: Abnormal  ? Collection Time: 02/11/22 12:52 AM  ?Result Value Ref Range  ? Sodium 133 (L) 135 - 145 mmol/L  ? Potassium 3.6 3.5 - 5.1 mmol/L  ? Chloride 88 (L) 98 - 111 mmol/L  ? CO2 37 (H) 22 - 32 mmol/L  ? Glucose, Bld 98 70 - 99 mg/dL  ?  Comment: Glucose reference range applies only to samples taken after fasting for at least 8 hours.  ? BUN 6 (L) 8 - 23 mg/dL  ? Creatinine, Ser 0.54 0.44 - 1.00 mg/dL  ? Calcium 9.1 8.9 - 10.3 mg/dL  ? GFR, Estimated >60 >60 mL/min  ?  Comment: (NOTE) ?Calculated using the CKD-EPI Creatinine Equation (2021) ?  ? Anion gap 8 5 - 15  ?  Comment: Performed at [facility], [address] [place], [place] [address]  ? ? ?Current Facility-Administered Medications  ?Medication Dose Route Frequency Provider Last Rate Last Admin  ? acetaminophen (TYLENOL) tablet 650 mg  650 mg Oral Q6H PRN Clydie Braun, MD      ? Or  ? acetaminophen (TYLENOL) suppository 650 mg  650 mg Rectal Q6H PRN Madelyn Flavors A, MD      ? albuterol (PROVENTIL) (2.5  MG/3ML) 0.083% nebulizer solution 2.5 mg  2.5 mg Nebulization Q6H PRN Madelyn Flavors A, MD      ? aspirin EC tablet 81 mg  81 mg Oral Daily Smith, Rondell A, MD   81 mg at 02/10/22 1331  ? atorvastatin (LIPITOR)

## 2022-02-12 NOTE — Progress Notes (Signed)
Patient has been combative all morning and socially inappropriate using profanity and yelling at staff. Nurse (myself) attempted to give patient morning oral medication crushed in apple sauce but patient refused. However, family stated that she used to drink "boosts", similar to ensures, at home. Nurse offered patient a chocolate ensure and was successful with patient drinking, and swallowing it (patient usually will spit everything out). Nurse informed family to notify staff for any additional need.  ?

## 2022-02-12 NOTE — Progress Notes (Signed)
Pt is agitated, trying to get out of bed, pulling leads off. RN tried to re-orient patient. Pt is verbally abusive to RN. Pt states " get the fuck out of my face, you do not have a badge, you are not my nurse. Do not touch me." Pt tried hitting and scratching RN. RN notified provider provider Carollee Herter DO.  ?

## 2022-02-13 ENCOUNTER — Inpatient Hospital Stay (HOSPITAL_COMMUNITY): Payer: 59

## 2022-02-13 DIAGNOSIS — I10 Essential (primary) hypertension: Secondary | ICD-10-CM | POA: Diagnosis not present

## 2022-02-13 DIAGNOSIS — G9341 Metabolic encephalopathy: Secondary | ICD-10-CM | POA: Diagnosis not present

## 2022-02-13 DIAGNOSIS — I5189 Other ill-defined heart diseases: Secondary | ICD-10-CM | POA: Diagnosis not present

## 2022-02-13 LAB — CBC WITH DIFFERENTIAL/PLATELET
Abs Immature Granulocytes: 0.02 10*3/uL (ref 0.00–0.07)
Basophils Absolute: 0.1 10*3/uL (ref 0.0–0.1)
Basophils Relative: 1 %
Eosinophils Absolute: 0 10*3/uL (ref 0.0–0.5)
Eosinophils Relative: 1 %
HCT: 51.3 % — ABNORMAL HIGH (ref 36.0–46.0)
Hemoglobin: 15.3 g/dL — ABNORMAL HIGH (ref 12.0–15.0)
Immature Granulocytes: 0 %
Lymphocytes Relative: 20 %
Lymphs Abs: 1.2 10*3/uL (ref 0.7–4.0)
MCH: 23.2 pg — ABNORMAL LOW (ref 26.0–34.0)
MCHC: 29.8 g/dL — ABNORMAL LOW (ref 30.0–36.0)
MCV: 77.8 fL — ABNORMAL LOW (ref 80.0–100.0)
Monocytes Absolute: 0.7 10*3/uL (ref 0.1–1.0)
Monocytes Relative: 11 %
Neutro Abs: 4 10*3/uL (ref 1.7–7.7)
Neutrophils Relative %: 67 %
Platelets: 283 10*3/uL (ref 150–400)
RBC: 6.59 MIL/uL — ABNORMAL HIGH (ref 3.87–5.11)
RDW: 17.5 % — ABNORMAL HIGH (ref 11.5–15.5)
WBC: 6 10*3/uL (ref 4.0–10.5)
nRBC: 0 % (ref 0.0–0.2)

## 2022-02-13 LAB — COMPREHENSIVE METABOLIC PANEL
ALT: 22 U/L (ref 0–44)
AST: 28 U/L (ref 15–41)
Albumin: 3.3 g/dL — ABNORMAL LOW (ref 3.5–5.0)
Alkaline Phosphatase: 51 U/L (ref 38–126)
Anion gap: 12 (ref 5–15)
BUN: 17 mg/dL (ref 8–23)
CO2: 37 mmol/L — ABNORMAL HIGH (ref 22–32)
Calcium: 10.1 mg/dL (ref 8.9–10.3)
Chloride: 91 mmol/L — ABNORMAL LOW (ref 98–111)
Creatinine, Ser: 0.74 mg/dL (ref 0.44–1.00)
GFR, Estimated: >60 mL/min (ref >60)
Glucose, Bld: 118 mg/dL — ABNORMAL HIGH (ref 70–99)
Potassium: 4.7 mmol/L (ref 3.5–5.1)
Sodium: 140 mmol/L (ref 135–145)
Total Bilirubin: 0.5 mg/dL (ref 0.3–1.2)
Total Protein: 7.3 g/dL (ref 6.5–8.1)

## 2022-02-13 LAB — GLUCOSE, CAPILLARY: Glucose-Capillary: 95 mg/dL (ref 70–99)

## 2022-02-13 LAB — LEAD, BLOOD (ADULT >= 16 YRS): Lead-Whole Blood: <1 ug/dL (ref 0.0–3.4)

## 2022-02-13 MED ORDER — FUROSEMIDE 10 MG/ML IJ SOLN
40.0000 mg | Freq: Every day | INTRAMUSCULAR | Status: DC
  Administered 2022-02-13: 40 mg via INTRAVENOUS
  Filled 2022-02-13: qty 4

## 2022-02-13 MED ORDER — OLANZAPINE 5 MG PO TBDP
5.0000 mg | ORAL_TABLET | Freq: Every morning | ORAL | Status: DC
  Administered 2022-02-14: 5 mg via ORAL
  Filled 2022-02-13: qty 1

## 2022-02-13 MED ORDER — OLANZAPINE 5 MG PO TABS: 5.0000 mg | ORAL_TABLET | Freq: Once | ORAL | Status: AC

## 2022-02-13 MED ORDER — HALOPERIDOL LACTATE 5 MG/ML IJ SOLN: 2.0000 mg | Freq: Every morning | INTRAMUSCULAR | Status: DC

## 2022-02-13 MED ORDER — CEFAZOLIN SODIUM-DEXTROSE 2-4 GM/100ML-% IV SOLN
2.0000 g | Freq: Three times a day (TID) | INTRAVENOUS | Status: AC
  Administered 2022-02-13 – 2022-02-18 (×14): 2 g via INTRAVENOUS
  Filled 2022-02-13 (×15): qty 100

## 2022-02-13 MED ORDER — HALOPERIDOL LACTATE 5 MG/ML IJ SOLN
2.0000 mg | Freq: Once | INTRAMUSCULAR | Status: AC
  Administered 2022-02-13: 2 mg via INTRAVENOUS
  Filled 2022-02-13: qty 1

## 2022-02-13 NOTE — Consult Note (Addendum)
[facility] Face-to-Face Psychiatry Consult  ? ?Reason for Consult:  AMS and psychosis ?Referring Physician:  Dr. Jerral Ralph ?Patient Identification: Tamara Russell ?MRN:  [id number] ?Principal Diagnosis: Acute metabolic encephalopathy ?Diagnosis:  Principal Problem: ?  Acute metabolic encephalopathy ?Active Problems: ?  Acute respiratory failure with hypoxia and hypercapnia (HCC) ?  Hypertension ?  PUD (peptic ulcer disease) ?  Elevated troponin ?  History of UTI ?  Hypokalemia ?  Class 3 severe obesity due to excess calories with serious comorbidity and body mass index (BMI) of 40.0 to 44.9 in adult [facility]) ?  Microcytosis ?  Diastolic dysfunction ? ? ?Total Time spent with patient: 30 minutes ? ?Subjective:   ?Tamara Russell is a 61 y.o. female patient  Admitted on 02/06/2022 for altered mental status and psychosis. Psychiatry consulted for AMS and psychosis.  ?Patient seen and chart reviewed- she was medication compliant with zyprexa 10 mg mg qhs as recommended by psychiatry; however, also received IV haldol 5 mg last night at 23:45. Patient has also been combative with nursing staff this  morning. ? ? ?Saw pt x2 today. On first interview attempt pt overtly paranoid, demanding to see my badge, and accused me of being about to assault her with a needle (had mistaken my pen for a needle). ? ?Attempted again ~1 hour later. Pt remained paranoid (asked to see badge several times) but was more redirectable than previously. Was able to state her name. Thought it was April, then started reading the pt information sheet (ie when asked date, stated it incorrectly and then read all information off of whiteboard including name of nurse and nurse tech). This caused her to become more paranoid as . Did deny SI but became too agitated to perform rest of safety screen, constantly accusing this Thereasa Parkin of attempting to hit her and screaming out "Code Blue, Code Blue". Unable to repeat B-CAM which had been positive all through the  weekend. At some point was able to state she was in the hospital for "psychiatric help" but continually stated this Thereasa Parkin was part of the "wrong team" and refused to engage. Attempted to have nurse chaperone remainder of evaluation to decrease pt distress with no success. Could not perform physical exam 2/2 agitation.  ? ?Collateral ?Spoke to husband - wife has been talking to someone (not him) in room all AM. Over the weekend she had periods where she improved (maybe 40% of baseline)  ? ? ? ? ? ?HPI :   ?Tamara Russell is a 61 y.o. female patient  Admitted on 02/06/2022 for altered mental status and psychosis. Psychiatry consulted for AMS and psychosis.  ? ? ? ?Past Medical History:  ?Past Medical History:  ?Diagnosis Date  ? Acid reflux   ? Complication of anesthesia   ? slow to wake up  ? History of COVID-19   ? Hypertension   ?  ?Past Surgical History:  ?Procedure Laterality Date  ? ABDOMINAL HYSTERECTOMY    ? BIOPSY  11/17/2018  ? Procedure: BIOPSY;  Surgeon: Beverley Fiedler, MD;  Location: [facility] ENDOSCOPY;  Service: Gastroenterology;;  ? BREAST EXCISIONAL BIOPSY Left   ? benign  ? BREAST EXCISIONAL BIOPSY Right   ? benign  ? ESOPHAGOGASTRODUODENOSCOPY (EGD) WITH PROPOFOL N/A 11/17/2018  ? Procedure: ESOPHAGOGASTRODUODENOSCOPY (EGD) WITH PROPOFOL;  Surgeon: Beverley Fiedler, MD;  Location: [facility] ENDOSCOPY;  Service: Gastroenterology;  Laterality: N/A;  ? ?Family History:  ?Family History  ?Problem Relation Age of Onset  ? Cancer Father 54  ?  stomach  ? Hypertension Father   ? Diabetes Maternal Grandmother   ? Colon cancer Maternal Grandfather   ? Diabetes Paternal Grandmother   ? Colon cancer Paternal Grandmother   ? ?Family history ?No family psychiatric history disclosed.  ?The patient's family history includes Cancer (age of onset: 2665) in her father; Colon cancer in her maternal grandfather and paternal grandmother; Diabetes in her maternal grandmother and paternal grandmother; Hypertension in her father. ?   ? ? ?Social History:  ?Social History  ? ?Substance and Sexual Activity  ?Alcohol Use No  ? Alcohol/week: 0.0 standard drinks  ?   ?Social History  ? ?Substance and Sexual Activity  ?Drug Use No  ?  ?Social History  ? ?Socioeconomic History  ? Marital status: Married  ?  Spouse name: Not on file  ? Number of children: Not on file  ? Years of education: Not on file  ? Highest education level: Not on file  ?Occupational History  ? Occupation: unemployed  ?Tobacco Use  ? Smoking status: Former  ?  Packs/day: 0.50  ?  Years: 20.00  ?  Pack years: 10.00  ?  Types: Cigarettes  ?  Start date: 04/30/1975  ?  Quit date: 04/30/1995  ?  Years since quitting: 26.8  ? Smokeless tobacco: Never  ?Vaping Use  ? Vaping Use: Never used  ?Substance and Sexual Activity  ? Alcohol use: No  ?  Alcohol/week: 0.0 standard drinks  ? Drug use: No  ? Sexual activity: Not on file  ?Other Topics Concern  ? Not on file  ?Social History Narrative  ? Not on file  ? ?Social Determinants of Health  ? ?Financial Resource Strain: Not on file  ?Food Insecurity: Not on file  ?Transportation Needs: Not on file  ?Physical Activity: Not on file  ?Stress: Not on file  ?Social Connections: Not on file  ? ?Additional Social History: ?  ? ?Allergies:   ?Allergies  ?Allergen Reactions  ? Penicillins Other (See Comments)  ?  Childhood allergy. Pt does not remember reaction  ? ? ?Labs:  ?Results for orders placed or performed during the hospital encounter of 02/06/22 (from the past 48 hour(s))  ?CBC with Differential/Platelet     Status: Abnormal  ? Collection Time: 02/13/22  9:30 AM  ?Result Value Ref Range  ? WBC 6.0 4.0 - 10.5 K/uL  ? RBC 6.59 (H) 3.87 - 5.11 MIL/uL  ? Hemoglobin 15.3 (H) 12.0 - 15.0 g/dL  ? HCT 51.3 (H) 36.0 - 46.0 %  ? MCV 77.8 (L) 80.0 - 100.0 fL  ? MCH 23.2 (L) 26.0 - 34.0 pg  ? MCHC 29.8 (L) 30.0 - 36.0 g/dL  ? RDW 17.5 (H) 11.5 - 15.5 %  ? Platelets 283 150 - 400 K/uL  ? nRBC 0.0 0.0 - 0.2 %  ? Neutrophils Relative % 67 %  ? Neutro Abs  4.0 1.7 - 7.7 K/uL  ? Lymphocytes Relative 20 %  ? Lymphs Abs 1.2 0.7 - 4.0 K/uL  ? Monocytes Relative 11 %  ? Monocytes Absolute 0.7 0.1 - 1.0 K/uL  ? Eosinophils Relative 1 %  ? Eosinophils Absolute 0.0 0.0 - 0.5 K/uL  ? Basophils Relative 1 %  ? Basophils Absolute 0.1 0.0 - 0.1 K/uL  ? Immature Granulocytes 0 %  ? Abs Immature Granulocytes 0.02 0.00 - 0.07 K/uL  ?  Comment: Performed at [facility], [address] [place], [place] [address]  ?Comprehensive metabolic panel  Status: Abnormal  ? Collection Time: 02/13/22  9:30 AM  ?Result Value Ref Range  ? Sodium 140 135 - 145 mmol/L  ? Potassium 4.7 3.5 - 5.1 mmol/L  ? Chloride 91 (L) 98 - 111 mmol/L  ? CO2 37 (H) 22 - 32 mmol/L  ? Glucose, Bld 118 (H) 70 - 99 mg/dL  ?  Comment: Glucose reference range applies only to samples taken after fasting for at least 8 hours.  ? BUN 17 8 - 23 mg/dL  ? Creatinine, Ser 0.74 0.44 - 1.00 mg/dL  ? Calcium 10.1 8.9 - 10.3 mg/dL  ? Total Protein 7.3 6.5 - 8.1 g/dL  ? Albumin 3.3 (L) 3.5 - 5.0 g/dL  ? AST 28 15 - 41 U/L  ? ALT 22 0 - 44 U/L  ? Alkaline Phosphatase 51 38 - 126 U/L  ? Total Bilirubin 0.5 0.3 - 1.2 mg/dL  ? GFR, Estimated >60 >60 mL/min  ?  Comment: (NOTE) ?Calculated using the CKD-EPI Creatinine Equation (2021) ?  ? Anion gap 12 5 - 15  ?  Comment: Performed at [facility], [address], [place], [place] [address]  ? ? ?Current Facility-Administered Medications  ?Medication Dose Route Frequency Provider Last Rate Last Admin  ? acetaminophen (TYLENOL) tablet 650 mg  650 mg Oral Q6H PRN Clydie Braun, MD      ? Or  ? acetaminophen (TYLENOL) suppository 650 mg  650 mg Rectal Q6H PRN Madelyn Flavors A, MD      ? albuterol (PROVENTIL) (2.5 MG/3ML) 0.083% nebulizer solution 2.5 mg  2.5 mg Nebulization Q6H PRN Madelyn Flavors A, MD      ? aspirin EC tablet 81 mg  81 mg Oral Daily Katrinka Blazing, Rondell A, MD   81 mg at 02/13/22 0915  ? atorvastatin (LIPITOR) tablet 40 mg  40 mg Oral QHS Smith, Rondell A, MD    40 mg at 02/12/22 2027  ? ceFAZolin (ANCEF) IVPB 2g/100 mL premix  2 g Intravenous Q8H Ghimire, Shanker M, MD 200 mL/hr at 02/13/22 1425 2 g at 02/13/22 1425  ? enoxaparin (LOVENOX) injection 50 mg  0.5 mg/kg Sub

## 2022-02-13 NOTE — Progress Notes (Signed)
?  X-cover Note: ?Received call from bedside RN. Appears pt aspirated while wearing bipap. Pt suctioned and placed on [place] O2. ? ?Will make pt NPO. ? ?Check abg and cxr. ? ?Carollee Herter, DO ?[facility] ? ?

## 2022-02-13 NOTE — Progress Notes (Signed)
Occupational Therapy Treatment ?Patient Details ?Name: Tamara Russell ?MRN: [id number] ?DOB: [birth year] ?Today's Date: 02/13/2022 ? ? ?History of present illness 61 y.o. female who presents after being found altered this morning sitting on the porch. Patient had just recently been hospitalized from 4/19-4/23 after presenting for altered mental status. MRI negative; Psychiatry consult due to recent home stressors and pt with hallucinations; +delirium  PMH significant of hypertension, reformed smoker, recently hypoxic and started on home O2, and obesity ?  ?OT comments ? Nursing states patient was paranoid earlier and had been given HALDOL.  Patient's daughter was in room and patient appeared more come. Patient was assisted with cleaning and gown change due to being wet and with limited to no patient participation due to lethargy.  Patient will continue to be followed by acute OT.   ? ?Recommendations for follow up therapy are one component of a multi-disciplinary discharge planning process, led by the attending physician.  Recommendations may be updated based on patient status, additional functional criteria and insurance authorization. ?   ?Follow Up Recommendations ? Skilled nursing-short term rehab (<3 hours/day)  ?  ?Assistance Recommended at Discharge Frequent or constant Supervision/Assistance  ?Patient can return home with the following ? Two people to help with walking and/or transfers;A little help with walking and/or transfers;A lot of help with bathing/dressing/bathroom;Direct supervision/assist for financial management;Direct supervision/assist for medications management ?  ?Equipment Recommendations ? Tub/shower seat  ?  ?Recommendations for Other Services   ? ?  ?Precautions / Restrictions Precautions ?Precautions: Fall;Other (comment) ?Precaution Comments: watch O2 sats ?Restrictions ?Weight Bearing Restrictions: No  ? ? ?  ? ?Mobility Bed Mobility ?Overal bed mobility: Needs Assistance ?Bed Mobility:  Rolling ?Rolling: Max assist ?  ?  ?  ?  ?General bed mobility comments: due to level of arousal patient required increased assistance ?  ? ?Transfers ?  ?  ?  ?  ?  ?  ?  ?  ?  ?General transfer comment: seen at bed level ?  ?  ?Balance Overall balance assessment: Needs assistance ?  ?  ?  ?  ?  ?  ?  ?  ?  ?  ?  ?  ?  ?  ?  ?  ?  ?  ?   ? ?ADL either performed or assessed with clinical judgement  ? ?ADL Overall ADL's : Needs assistance/impaired ?  ?  ?Grooming: Oral care;Wash/dry face;Minimal assistance;Bed level ?Grooming Details (indicate cue type and reason): daughter assisted patient with using mouthwash and washing face ?Upper Body Bathing: Maximal assistance;Bed level ?Upper Body Bathing Details (indicate cue type and reason): due to level of arousal ?Lower Body Bathing: Total assistance;Bed level ?Lower Body Bathing Details (indicate cue type and reason): due to level of arousal ?Upper Body Dressing : Maximal assistance;Bed level ?Upper Body Dressing Details (indicate cue type and reason): due to level of arousal ?  ?  ?  ?  ?  ?  ?  ?  ?  ?General ADL Comments: Assisted nursing with bathing and dressing patient at bed level. Patient became more lethargic during session and was unable to assist with self care ?  ? ?Extremity/Trunk Assessment   ?  ?  ?  ?  ?  ? ?Vision   ?  ?  ?Perception   ?  ?Praxis   ?  ? ?Cognition Arousal/Alertness: Awake/alert, Lethargic ?Behavior During Therapy: [facility] for tasks assessed/performed, Flat affect ?Overall Cognitive Status: Impaired/Different from baseline ?Area of Impairment:  Safety/judgement, Awareness, Following commands, Attention ?  ?  ?  ?  ?  ?  ?  ?  ?  ?Current Attention Level: Sustained, Focused ?  ?Following Commands: Follows one step commands with increased time ?Safety/Judgement: Decreased awareness of deficits, Decreased awareness of safety ?Awareness: Emergent ?Problem Solving: Slow processing, Requires verbal cues, Difficulty sequencing, Requires tactile  cues ?General Comments: Patient alert upon entry and became more drowsy possibly due to medication ?  ?  ?   ?Exercises   ? ?  ?Shoulder Instructions   ? ? ?  ?General Comments    ? ? ?Pertinent Vitals/ Pain       Pain Assessment ?Pain Assessment: No/denies pain ? ?Home Living   ?  ?  ?  ?  ?  ?  ?  ?  ?  ?  ?  ?  ?  ?  ?  ?  ?  ?  ? ?  ?Prior Functioning/Environment    ?  ?  ?  ?   ? ?Frequency ? Min 2X/week  ? ? ? ? ?  ?Progress Toward Goals ? ?OT Goals(current goals can now be found in the care plan section) ? Progress towards OT goals: Progressing toward goals ? ?Acute Rehab OT Goals ?OT Goal Formulation: Patient unable to participate in goal setting ?Time For Goal Achievement: 02/25/22 ?Potential to Achieve Goals: Good ?ADL Goals ?Pt Will Perform Grooming: with supervision;standing ?Pt Will Perform Lower Body Dressing: with supervision;sit to/from stand ?Pt Will Transfer to Toilet: with supervision;ambulating ?Pt Will Perform Toileting - Clothing Manipulation and hygiene: with supervision;sitting/lateral leans;sit to/from stand ?Additional ADL Goal #1: Pt will demonstrate improved mentation by scoring <4/10 on short blessed test and answering 4/4 safety questions from the [facility] correctly: 1. What do you do for yourself if you are sick with a cold. 2. What do you do if you burn yourself and the wound becomes infected. 3. What do you do if you experience severe chest pain and shortness of breath? 4. What number do you call in an emergency? ?Additional ADL Goal #2: Pt will demonstrate improved cognition by consistently following 2 step instructions, and initiating ADLs when needed without cues.  ?Plan Discharge plan remains appropriate   ? ?Co-evaluation ? ? ?   ?  ?  ?  ?  ? ?  ?AM-PAC OT "6 Clicks" Daily Activity     ?Outcome Measure ? ? Help from another person eating meals?: A Little ?Help from another person taking care of personal grooming?: A Little ?Help from another person toileting, which includes using  toliet, bedpan, or urinal?: A Lot ?Help from another person bathing (including washing, rinsing, drying)?: A Lot ?Help from another person to put on and taking off regular upper body clothing?: A Little ?Help from another person to put on and taking off regular lower body clothing?: A Lot ?6 Click Score: 15 ? ?  ?End of Session Equipment Utilized During Treatment: Oxygen ? ?OT Visit Diagnosis: Unsteadiness on feet (R26.81);Other symptoms and signs involving cognitive function;Dizziness and giddiness (R42) ?  ?Activity Tolerance Patient limited by lethargy ?  ?Patient Left in bed;with call bell/phone within reach;with nursing/sitter in room;with family/visitor present ?  ?Nurse Communication Mobility status ?  ? ?   ? ?Time: [id number] ?OT Time Calculation (min): 36 min ? ?Charges: OT General Charges ?$OT Visit: 1 Visit ?OT Treatments ?$Self Care/Home Management : 23-37 mins ? ?Lodema Hong, OTA ?Acute Rehabilitation Services  ?Pager [phone] ?Office [phone] ? ? ?  [place] ?02/13/2022, 3:07 PM ?

## 2022-02-13 NOTE — Significant Event (Signed)
Rapid Response Event Note  ? ?Reason for Call :  ?Hypoxia, bradycardia, and decreased LOC. ? ?Per RN, pt dropped HR to 30s on monitor. When they entered room, pt was on bipap with secretions in mask, SpO2 mid 70s. Pt was taken off bipap, placed on HFNC, and deep oral suctioned. Pt was drooling and barely responsive.  ?Initial Focused Assessment:  ?Pt lying in bed with eyes open, with a blank stare. After repeated verbal and painful stimulation, pt woke up and was able to move all extremities. Pt very confused, agitated, and yelling(this appears to be her baseline). Lungs clear/diminished. Skin warm to touch, diaphoretic.   ? ?HR-85, BP(unable to obtain d/t agitation), RR-26, SpO2-955 on 12L HFNC. ? ?Interventions:  ?PCXR ?ABG-unable to obtain d/t agitation ?NPO ?Plan of Care:  ?Pt back to baseline mental status. VSS. Await PCXR results. Wean FiO2 if able. Continue to monitor pt. Call RRT if further assistance needed.  ? ? ?Event Summary:  ? ?MD Notified: Dr. Bridgett Larsson notified by bedside RN ?Call [place] ?Arrival [id number] ?End Time:0140 ? ?Dillard Essex, RN ?

## 2022-02-13 NOTE — Progress Notes (Signed)
Patient is also off BIPAP at this time due to possibly aspirating. Back on nasal cannula sats 96%. ?

## 2022-02-13 NOTE — Progress Notes (Signed)
RT unable to obtain ABG. MD made aware. Patient is very combative and it would be unsafe to attempt an ABG on this patient. RT to page MD. ?

## 2022-02-13 NOTE — Care Management (Signed)
Pt found to be bradycardic to 30s on telemetry. This RN found pt unresponsive and drooling with bipap mask on. Pt taken off bipap and deep sunctioned placed on 15 L [place]. RR and MD called. Orders placed for chest XR and pt to be NPO. Pt's HR stabilized, O2 sat at 94% on 12 L, and neuro status back to baseline.  ?

## 2022-02-13 NOTE — Evaluation (Signed)
Clinical/Bedside Swallow Evaluation ?Patient Details  ?Name: Tamara Russell ?MRN: [id number] ?Date of Birth: 25-Jul-1961 ? ?Today's Date: 02/13/2022 ?Time: SLP Start Time (ACUTE ONLY): 1337 SLP Stop Time (ACUTE ONLY): 1420 ?SLP Time Calculation (min) (ACUTE ONLY): 43 min ? ?Past Medical History:  ?Past Medical History:  ?Diagnosis Date  ? Acid reflux   ? Complication of anesthesia   ? slow to wake up  ? History of COVID-19   ? Hypertension   ? ?Past Surgical History:  ?Past Surgical History:  ?Procedure Laterality Date  ? ABDOMINAL HYSTERECTOMY    ? BIOPSY  11/17/2018  ? Procedure: BIOPSY;  Surgeon: Beverley Fiedler, MD;  Location: [facility] ENDOSCOPY;  Service: Gastroenterology;;  ? BREAST EXCISIONAL BIOPSY Left   ? benign  ? BREAST EXCISIONAL BIOPSY Right   ? benign  ? ESOPHAGOGASTRODUODENOSCOPY (EGD) WITH PROPOFOL N/A 11/17/2018  ? Procedure: ESOPHAGOGASTRODUODENOSCOPY (EGD) WITH PROPOFOL;  Surgeon: Beverley Fiedler, MD;  Location: [facility] ENDOSCOPY;  Service: Gastroenterology;  Laterality: N/A;  ? ?HPI:  ?Pt is a 61 yo female recently d/c on 4/23 (hospitalized for CP, confusion, UTI), who presented on 4/24 with AMS. Admitted with acute encephalopathy of unclear etiology. MRI negative. Psychiatry consult due to recent home stressors and pt with hallucinations; +delirium. There was concern for aspiration event while on BiPAP early hours of 5/1. CXR showed bibasilar opacities that may reflect atelectasis and/or aspiration/PNA. SLP consulted. PMH includes: GERD, morbid obesity, HTN, reformed smoker, recently hypoxic and started on home O2  ?  ?Assessment / Plan / Recommendation  ?Clinical Impression ? Pt presents with what is suspected to be an acute, cognitively-based dysphagia given normal swallow function PTA reported by family. She has oral holding of secretions at baseline but did spit them out when presented with a cup to do so. Vocal quality is clear though. She was skeptical of SLP throughout PO trials but calm, but only taking a  few sips of thin liquids in total despite cues from SLP and encouragement from husband/daughter as well. Pt orally held and then expectorated most of what was offered, but did swallow a few sips without any overt s/s of aspiration. Although she declined purees, she had reduced mastication with graham cracker, mostly letting it dissolve in her mouth before clearing it with a liquid wash. Her current mentation likely puts her at risk for inadequate nutrition and hydration, but also increases her risk for episodic aspiration such as what may have happened overnight. Would not send meal trays, but would offer small sips of thin liquids or bites of purees from floor stock only as mentation allows (alert, cooperative, swallowing without oral holding or spitting). Family was educated at bedside. RN notes that she has not been able to get her to take meds today, so may also want to consider alternative routes for more consistent access. SLP will continue to follow. ? ?SLP Visit Diagnosis: Dysphagia, unspecified (R13.10) ?   ?Aspiration Risk ? Moderate aspiration risk;Risk for inadequate nutrition/hydration  ?  ?Diet Recommendation NPO except meds;Other (Comment) (small sips of thin liquids or bites of purees if alert, cooperative, and swallowing without oral holding/spitting)  ? ?Liquid Administration via: Straw ?Medication Administration: Via alternative means (could crush in puree if pt is accepting) ?Supervision: Staff to assist with self feeding;Full supervision/cueing for compensatory strategies ?Compensations: Slow rate;Small sips/bites ?Postural Changes: Seated upright at 90 degrees  ?  ?Other  Recommendations Oral Care Recommendations: Oral care QID ?Other Recommendations: Have oral suction available   ? ?Recommendations  for follow up therapy are one component of a multi-disciplinary discharge planning process, led by the attending physician.  Recommendations may be updated based on patient status, additional  functional criteria and insurance authorization. ? ?Follow up Recommendations Skilled nursing-short term rehab (<3 hours/day)  ? ? ?  ?Assistance Recommended at Discharge Frequent or constant Supervision/Assistance  ?Functional Status Assessment    ?Frequency and Duration min 2x/week  ?2 weeks ?  ?   ? ?Prognosis Prognosis for Safe Diet Advancement: Good ?Barriers to Reach Goals: Cognitive deficits  ? ?  ? ?Swallow Study   ?General HPI: Pt is a 61 yo female recently d/c on 4/23 (hospitalized for CP, confusion, UTI), who presented on 4/24 with AMS. Admitted with acute encephalopathy of unclear etiology. MRI negative. Psychiatry consult due to recent home stressors and pt with hallucinations; +delirium. There was concern for aspiration event while on BiPAP early hours of 5/1. CXR showed bibasilar opacities that may reflect atelectasis and/or aspiration/PNA. SLP consulted. PMH includes: GERD, morbid obesity, HTN, reformed smoker, recently hypoxic and started on home O2 ?Type of Study: Bedside Swallow Evaluation ?Previous Swallow Assessment: none in chart ?Diet Prior to this Study: NPO ?Temperature Spikes Noted: No ?Respiratory Status: Nasal cannula ?History of Recent Intubation: No ?Behavior/Cognition: Alert;Confused;Requires cueing ?Oral Cavity Assessment: Excessive secretions ?Oral Care Completed by SLP: No ?Oral Cavity - Dentition: Missing dentition;Dentures, not available ?Vision: Functional for self-feeding ?Self-Feeding Abilities: Able to feed self;Needs assist ?Patient Positioning: Upright in bed ?Baseline Vocal Quality: Normal  ?  ?Oral/Motor/Sensory Function Overall Oral Motor/Sensory Function: Within functional limits (would not let me touch her, but [facility] for all other components that could be observed)   ?Ice Chips Ice chips: Not tested   ?Thin Liquid Thin Liquid: Impaired ?Presentation: Straw ?Oral Phase Functional Implications: Right anterior spillage;Oral holding;Other (comment) (spitting)  ?  ?Nectar  Thick Nectar Thick Liquid: Not tested   ?Honey Thick Honey Thick Liquid: Not tested   ?Puree Puree: Not tested (pt refused)   ?Solid ? ? ?  Solid: Impaired ?Presentation: Self Fed ?Oral Phase Impairments: Impaired mastication;Other (comment) (mostly sucked on cracker until it dissolved)  ? ?  ? ?Mahala Menghini., M.A. CCC-SLP ?Acute Rehabilitation Services ?Office [phone] ? ?Secure chat preferred ? ?02/13/2022,3:26 PM ? ? ? ?

## 2022-02-13 NOTE — Progress Notes (Signed)
?      ?                 PROGRESS NOTE ? ?      ?PATIENT DETAILS ?Name: Tamara Russell ?Age: 61 y.o. ?Sex: female ?Date of Birth: 01-01-1961 ?Admit Date: 02/06/2022 ?Admitting Physician Clydie Braun, MD ?LGX:QJJHE, Adrian Saran, MD ? ?Brief Summary: ?Patient is a 61 y.o.  female morbid obesity, HTN, GERD-who was just discharged from this facility on 4/23-presented to the hospital on 4/24 with altered mental status. ? ?Significant events: ?4/19-4/23>> hospitalization for chest pain, confusion-UTI.  Status stable at discharge. ?4/24> presented to the ED for confusion-admit to [facility]. ?5/01>> episode of aspiration-bradycardia/worsening hypoxemia-improved after suctioning. ? ?Significant studies: ?4/20>> HIV: Negative ?4/20>> TSH: Not elevated ?4/24>> CT head: No acute intracranial abnormality ?4/24>> CXR: No PNA ?4/24>> RPR: Nonreactive ?4/24>> NH4: Negative ?4/24>> alcohol level: 65 ?4/26>> EEG: No seizures ?4/26>> vitamin B12: 2964 ?4/26>> vitamin B1: Within normal limit. ?4/27>> acute hepatitis serology: Negative ? ?Significant microbiology data: ?4/20>> urine culture: E. coli/Citrobacter ?4/24>> COVID/influenza PCR: Negative ?4/24>> urine culture: Enterococcus faecalis ?4/24>> blood culture: Negative ? ?Procedures: ?None ? ?Consults: ?Neurology, psychiatry ? ?Subjective: ?Had a brief episode of possible aspiration episode while on BiPAP last night-suction-briefly required 12 L but after suctioning back down to usual 2 L.  Today-she will follow commands but is very suspicious-almost paranoid of people coming into the room.  Claims that numerous people are trying to hit her.  She was apparently significantly agitated overnight as well-and has not slept awake.  Restless-repeating same words at times. ? ?Refused blood work earlier this morning-however with spouse help-and MD distracting patient-RN/phlebotomy was able to draw some blood. ? ?Objective: ?Vitals: ?Blood pressure 114/82, pulse (!) 103, temperature 98.8 ?F  (37.1 ?C), temperature source Oral, resp. rate 20, SpO2 93 %.  ? ?Exam: ?Gen Exam: Not in any distress. ?HEENT:atraumatic, normocephalic ?Chest: B/L clear to auscultation anteriorly ?CVS:S1S2 regular ?Abdomen:soft non tender, non distended ?Extremities:no edema ?Neurology: Non focal ?Skin: no rash  ? ?Pertinent Labs/Radiology: ? ?  Latest Ref Rng & Units 02/13/2022  ?  9:30 AM 02/09/2022  ? 12:33 AM 02/08/2022  ?  1:07 AM  ?CBC  ?WBC 4.0 - 10.5 K/uL 6.0   4.5   4.2    ?Hemoglobin 12.0 - 15.0 g/dL 17.4   08.1   44.8    ?Hematocrit 36.0 - 46.0 % 51.3   46.9   44.6    ?Platelets 150 - 400 K/uL 283   259   239    ?  ?Lab Results  ?Component Value Date  ? NA 140 02/13/2022  ? K 4.7 02/13/2022  ? CL 91 (L) 02/13/2022  ? CO2 37 (H) 02/13/2022  ? ?  ? ? ?Assessment/Plan: ?Acute metabolic encephalopathy: Extensive work-up completed-per neurology-this is likely psychogenic etiology-in response to significant marital stressors at home.this morning-she recognized me-answers appropriately to most of my questions but is very restless/agitated-and is very suspicious and almost paranoid.  Psychiatry following-we will await further recommendations. ? ?Asymptomatic bacteriuria: Suspect urine culture results more suggestive of asymptomatic bacteriuria rather than a UTI. Patient had 2 different organisms in urine culture within a week in spite of treatment.  She was initially on Rocephin-plans were to start ampicillin but she has a documented allergy to PCN-hence after discussion with pharmacy- was given her 1 dose of fosfomycin.   ? ?Possible aspiration PNA: Occurred on 5/1 night-while on BiPAP-briefly required 12 L of oxygen-was quickly suctioned with significant  improvement.  CXR with either PNA or pulm edema.  Start Unasyn-we will diuresis with Lasix.  SLP eval pending. ? ?Chronic hypoxic respiratory failure: Per family-patient was discharged on home O2 during her most recent hospitalization.  Stable on 2 L of oxygen. ? ?OSA: On  BiPAP-reasonable to hold due to aspiration episode.  Will use oxygen instead. ? ?Hyponatremia: Minimal/mild-doubt this is of any clinical significance. ? ?Chronic HFpEF: Volume status stable-stop Aldactone-and will use IV Lasix to ensure she is in negative balance-chest x-ray shows possible pulmonary edema. ? ?HTN: BP stable on metoprolol ? ?HLD: Statin ? ?Debility/deconditioning: Due to acute illness-recommendations from PT are for SNF. ? ?Morbid Obesity: ?Estimated body mass index is 40.74 kg/m? as calculated from the following: ?  Height as of 11/21/21: 5\' 1"  (1.549 m). ?  Weight as of 02/05/22: 97.8 kg.  ? ?Code status: ?  Code Status: Full Code  ? ?DVT Prophylaxis: Prophylactic Lovenox  ? ?Family Communication: Spouse at bedside. ? ? ?Disposition Plan: ?Status is: Inpatient ?Remains inpatient appropriate because: Continues to be confused-now aspirated overnight-on Unasyn.  Not yet stable for discharge. ?  ?Planned Discharge Destination: SNF ? ? ?Diet: ?Diet Order   ? ?       ?  Diet NPO time specified  Diet effective now       ?  ? ?  ?  ? ?  ?  ? ? ?Antimicrobial agents: ?Anti-infectives (From admission, onward)  ? ? Start     Dose/Rate Route Frequency Ordered Stop  ? 02/13/22 1400  ceFAZolin (ANCEF) IVPB 2g/100 mL premix       ? 2 g ?200 mL/hr over 30 Minutes Intravenous Every 8 hours 02/13/22 0951 02/20/22 1359  ? 02/08/22 1530  fosfomycin (MONUROL) packet 3 g       ? 3 g Oral  Once 02/08/22 1430 02/08/22 1548  ? 02/07/22 1500  cefTRIAXone (ROCEPHIN) 1 g in sodium chloride 0.9 % 100 mL IVPB  Status:  Discontinued       ? 1 g ?200 mL/hr over 30 Minutes Intravenous Every 24 hours 02/07/22 1344 02/08/22 1412  ? 02/07/22 1200  cefTRIAXone (ROCEPHIN) 1 g in sodium chloride 0.9 % 100 mL IVPB  Status:  Discontinued       ? 1 g ?200 mL/hr over 30 Minutes Intravenous Every 24 hours 02/06/22 1328 02/06/22 1729  ? 02/06/22 1800  cefTRIAXone (ROCEPHIN) 1 g in sodium chloride 0.9 % 100 mL IVPB       ? 1 g ?200 mL/hr over 30  Minutes Intravenous  Once 02/06/22 1729 02/06/22 1938  ? ?  ? ? ? ?MEDICATIONS: ?Scheduled Meds: ? aspirin EC  81 mg Oral Daily  ? atorvastatin  40 mg Oral QHS  ? enoxaparin (LOVENOX) injection  0.5 mg/kg Subcutaneous Q24H  ? furosemide  40 mg Intravenous Daily  ? melatonin  3 mg Oral q1800  ? metoprolol succinate  12.5 mg Oral q morning  ? OLANZapine zydis  15 mg Oral QHS  ? sodium chloride flush  3 mL Intravenous Q12H  ? thiamine  100 mg Oral Daily  ? ?Continuous Infusions: ?  ceFAZolin (ANCEF) IV    ? ? ? ?PRN Meds:.acetaminophen **OR** acetaminophen, albuterol, hydrocortisone ? ? ?I have personally reviewed following labs and imaging studies ? ?LABORATORY DATA: ?CBC: ?Recent Labs  ?Lab 02/06/22 ?1351 02/07/22 ?0400 02/08/22 ?0107 02/09/22 ?[id number] 02/13/22 ?0930  ?WBC  --  4.3 4.2 4.5 6.0  ?NEUTROABS  --   --  2.8  --  4.0  ?HGB 17.0* 14.6 13.6 13.8 15.3*  ?HCT 50.0* 49.8* 44.6 46.9* 51.3*  ?MCV  --  78.8* 78.2* 78.0* 77.8*  ?PLT  --  292 239 259 283  ? ? ? ?Basic Metabolic Panel: ?Recent Labs  ?Lab 02/07/22 ?0400 02/08/22 ?0107 02/09/22 ?3086 02/11/22 ?0052 02/13/22 ?0930  ?NA 135 133* 137 133* 140  ?K 3.7 3.5 3.9 3.6 4.7  ?CL 87* 85* 88* 88* 91*  ?CO2 38* 39* 38* 37* 37*  ?GLUCOSE 106* 107* 95 98 118*  ?BUN 14 10 10  6* 17  ?CREATININE 0.71 0.47 0.56 0.54 0.74  ?CALCIUM 9.5 8.9 9.5 9.1 10.1  ?MG 2.0  --   --   --   --   ? ? ? ?GFR: ?Estimated Creatinine Clearance: 77 mL/min (by C-G formula based on SCr of 0.74 mg/dL). ? ?Liver Function Tests: ?Recent Labs  ?Lab 02/09/22 ?02/11/22 02/13/22 ?0930  ?AST 26 28  ?ALT 20 22  ?ALKPHOS 49 51  ?BILITOT 0.7 0.5  ?PROT 6.4* 7.3  ?ALBUMIN 3.0* 3.3*  ? ? ?No results for input(s): LIPASE, AMYLASE in the last 168 hours. ? ?No results for input(s): AMMONIA in the last 168 hours. ? ? ?Coagulation Profile: ?No results for input(s): INR, PROTIME in the last 168 hours. ? ?Cardiac Enzymes: ?No results for input(s): CKTOTAL, CKMB, CKMBINDEX, TROPONINI in the last 168 hours. ? ?BNP (last 3  results) ?No results for input(s): PROBNP in the last 8760 hours. ? ?Lipid Profile: ?No results for input(s): CHOL, HDL, LDLCALC, TRIG, CHOLHDL, LDLDIRECT in the last 72 hours. ? ?Thyroid Function Tests: ?No r

## 2022-02-13 NOTE — Progress Notes (Incomplete)
RR RN and Dr. Imogene Burn called ?

## 2022-02-14 DIAGNOSIS — I5189 Other ill-defined heart diseases: Secondary | ICD-10-CM | POA: Diagnosis not present

## 2022-02-14 DIAGNOSIS — I10 Essential (primary) hypertension: Secondary | ICD-10-CM | POA: Diagnosis not present

## 2022-02-14 DIAGNOSIS — G9341 Metabolic encephalopathy: Secondary | ICD-10-CM | POA: Diagnosis not present

## 2022-02-14 LAB — BASIC METABOLIC PANEL
Anion gap: 9 (ref 5–15)
BUN: 18 mg/dL (ref 8–23)
CO2: 41 mmol/L — ABNORMAL HIGH (ref 22–32)
Calcium: 9.9 mg/dL (ref 8.9–10.3)
Chloride: 96 mmol/L — ABNORMAL LOW (ref 98–111)
Creatinine, Ser: 0.7 mg/dL (ref 0.44–1.00)
GFR, Estimated: >60 mL/min (ref >60)
Glucose, Bld: 97 mg/dL (ref 70–99)
Potassium: 3.7 mmol/L (ref 3.5–5.1)
Sodium: 146 mmol/L — ABNORMAL HIGH (ref 135–145)

## 2022-02-14 MED ORDER — STERILE WATER FOR INJECTION IJ SOLN
INTRAMUSCULAR | Status: AC
  Administered 2022-02-14: 10 mL
  Filled 2022-02-14: qty 10

## 2022-02-14 MED ORDER — LORAZEPAM 2 MG/ML IJ SOLN
2.0000 mg | Freq: Two times a day (BID) | INTRAMUSCULAR | Status: DC
  Administered 2022-02-15 – 2022-02-17 (×4): 2 mg via INTRAVENOUS
  Filled 2022-02-14 (×5): qty 1

## 2022-02-14 MED ORDER — HALOPERIDOL LACTATE 5 MG/ML IJ SOLN
5.0000 mg | Freq: Two times a day (BID) | INTRAMUSCULAR | Status: DC
  Administered 2022-02-14 – 2022-02-17 (×5): 5 mg via INTRAVENOUS
  Filled 2022-02-14 (×6): qty 1

## 2022-02-14 MED ORDER — ZIPRASIDONE MESYLATE 20 MG IM SOLR
20.0000 mg | Freq: Once | INTRAMUSCULAR | Status: AC
  Administered 2022-02-14: 20 mg via INTRAMUSCULAR
  Filled 2022-02-14: qty 20

## 2022-02-14 MED ORDER — LORAZEPAM 2 MG/ML IJ SOLN
2.0000 mg | Freq: Once | INTRAMUSCULAR | Status: AC
  Administered 2022-02-14: 2 mg via INTRAMUSCULAR
  Filled 2022-02-14: qty 1

## 2022-02-14 NOTE — Progress Notes (Signed)
PT Cancellation Note ? ?Patient Details ?Name: Tamara Russell ?MRN: [id number] ?DOB: 03-01-1961 ? ? ?Cancelled Treatment:    Reason Eval/Treat Not Completed: Medical issues which prohibited therapy ? ?Patient immediately began screaming "get out, get out, get out..." upon seeing PT enter room. Daughter present and attempted to calm pt and convince her to work wit PT and pt continued screaming. Will try again later as schedule permits.  ? ? ?Jerolyn Center, PT ?Acute Rehabilitation Services  ?Pager [phone] ?Office [phone] ? ?Tamara Russell ?02/14/2022, 10:35 AM ?

## 2022-02-14 NOTE — Progress Notes (Signed)
Pt has refused medications today.  Any time I entered room today pt has yelled "Get out of my room" ,  along with cursing.  Pt has also yelled at other staff members.  Pt pulled IV tubing apart and blood was on her sheets.  We removed pts IV and called IV team to replace but pt would not allow IV RN to get near her.  Pt's daughter in room now with pt.  Will page IV team again when pt calms down. ?

## 2022-02-14 NOTE — Progress Notes (Signed)
Speech Language Pathology Treatment: Dysphagia  ?Patient Details ?Name: Tamara Russell ?MRN: [id number] ?DOB: [birth year] ?Today's Date: 02/14/2022 ?Time: 7829-5621 ?SLP Time Calculation (min) (ACUTE ONLY): 8 min ? ?Assessment / Plan / Recommendation ?Clinical Impression ? Patient seen by SLP for skilled treatment with focus on dysphagia goals. Daughter in room at patient's bedside and patient awake and alert. Upon SLP entering room, patient saying "stop right there", "uh-uh, no, no" "he's hiding his badge" "a gun" "alert!". She continued repeating phrases the duration of SLP being in the room. SLP spoke with patient's daughter briefly (SLP standing by sink entire time, approximately 5 feet from patient). She reported that patient likes Ensure but not Glucerna which she has been given. She also reported patient would like chicken broth "with thickener". When SLP inquiring about patient needing thickener, daughter did state that patient has had some swallowing issues. (unable to fully discuss this as patient continuing to be agitated and talking loudly) SLP brought several items for patient to try: vanilla and chocolate pudding, graham crackers, milk, and nectar thickened chicken broth. Patient refused any PO's from SLP and so they were left in room for daughter to try with her.  ?Another SLP who had done patient's initial evaluation offered to try to work with patient. Although she too was not able to directly observe patient with PO's, daughter reported that patient drank all of the milk (almost too quickly per daughter) and didn' have any observed coughing, throat clearing, etc. Daugher and the other SLP discussed plan for PO's and both in agreement to initiate diet of Dys 1(puree solids), thin liquids. SLP services will continue to monitor/follow patient as able for diet toleration and readiness to upgrade solid textures. As patient has been refusing majority of interventions from nursing, MD, therapy, etc., SLP  will likely have to more indirectly treat patient. ?  ?HPI HPI: Pt is a 61 yo female recently d/c on 4/23 (hospitalized for CP, confusion, UTI), who presented on 4/24 with AMS. Admitted with acute encephalopathy of unclear etiology. MRI negative. Psychiatry consult due to recent home stressors and pt with hallucinations; +delirium. There was concern for aspiration event while on BiPAP early hours of 5/1. CXR showed bibasilar opacities that may reflect atelectasis and/or aspiration/PNA. SLP consulted. PMH includes: GERD, morbid obesity, HTN, reformed smoker, recently hypoxic and started on home O2 ?  ?   ?SLP Plan ? Continue with current plan of care ? ?  ?  ?Recommendations for follow up therapy are one component of a multi-disciplinary discharge planning process, led by the attending physician.  Recommendations may be updated based on patient status, additional functional criteria and insurance authorization. ?  ? ?Recommendations  ?Diet recommendations: Dysphagia 1 (puree);Thin liquid ?Liquids provided via: Cup;Straw ?Medication Administration: Via alternative means ?Supervision: Full supervision/cueing for compensatory strategies (daughter can feed patient, likely she would refuse help from staff) ?Compensations: Slow rate;Small sips/bites ?Postural Changes and/or Swallow Maneuvers: Seated upright 90 degrees  ?   ?    ?   ? ? ? ? Oral Care Recommendations: Oral care BID ?Follow Up Recommendations: Skilled nursing-short term rehab (<3 hours/day) ?Assistance recommended at discharge: Frequent or constant Supervision/Assistance ?SLP Visit Diagnosis: Dysphagia, unspecified (R13.10) ?Plan: Continue with current plan of care ? ? ? ? ?  ?Angela Nevin, MA, CCC-SLP ?Speech Therapy ? ?

## 2022-02-14 NOTE — Progress Notes (Addendum)
?      ?                 PROGRESS NOTE ? ?      ?PATIENT DETAILS ?Name: Tamara Russell ?Age: 61 y.o. ?Sex: female ?Date of Birth: Jul 11, 1961 ?Admit Date: 02/06/2022 ?Admitting Physician Clydie Braunondell A Smith, MD ?ZOX:WRUEAPCP:Bland, Adrian SaranVeita, MD ? ?Brief Summary: ?Patient is a 61 y.o.  female morbid obesity, HTN, GERD-who was just discharged from this facility on 4/23-presented to the hospital on 4/24 with altered mental status. ? ?Significant events: ?4/19-4/23>> hospitalization for chest pain, confusion-UTI.  Status stable at discharge. ?4/24> presented to [facility] ED for confusion[facility] ?5/01>> episode of aspiration-bradycardia/worsening hypoxemia-improved after suctioning. ? ?Significant studies: ?4/20>> HIV: Negative ?4/20>> TSH: Not elevated ?4/24>> CT head: No acute intracranial abnormality ?4/24>> CXR: No PNA ?4/24>> RPR: Nonreactive ?4/24>> NH4: Normal ?4/24>> alcohol level: 65 ?4/26>> EEG: No seizures ?4/26>> vitamin B12: 2964 ?4/26>> vitamin B1: Normal ?4/27>> acute hepatitis serology: Negative ?4/29>> lead levels:Normal ? ?Significant microbiology data: ?4/20>> urine culture: E. coli/Citrobacter ?4/24>> COVID/influenza PCR: Negative ?4/24>> urine culture: Enterococcus faecalis ?4/24>> blood culture: Negative ? ?Procedures: ?None ? ?Consults: ?Neurology, psychiatry ? ?Subjective: ?Remains essentially unchanged compared to yesterday-very suspicious-almost paranoid-asking to see my ID badge. ? ?Objective: ?Vitals: ?Blood pressure 95/73, pulse 100, temperature 99.7 ?F (37.6 ?C), temperature source Axillary, resp. rate 17, SpO2 97 %.  ? ?Exam: ?Gen Exam:Alert awake-not in any distress ?HEENT:atraumatic, normocephalic ?Chest: B/L clear to auscultation anteriorly ?CVS:S1S2 regular ?Abdomen:soft non tender, non distended ?Extremities:no edema ?Neurology: Non focal ?Skin: no rash  ? ?Pertinent Labs/Radiology: ? ?  Latest Ref Rng & Units 02/13/2022  ?  9:30 AM 02/09/2022  ? 12:33 AM 02/08/2022  ?  1:07 AM  ?CBC  ?WBC 4.0 - 10.5  K/uL 6.0   4.5   4.2    ?Hemoglobin 12.0 - 15.0 g/dL 54.015.3   98.113.8   19.113.6    ?Hematocrit 36.0 - 46.0 % 51.3   46.9   44.6    ?Platelets 150 - 400 K/uL 283   259   239    ?  ?Lab Results  ?Component Value Date  ? NA 146 (H) 02/14/2022  ? K 3.7 02/14/2022  ? CL 96 (L) 02/14/2022  ? CO2 41 (H) 02/14/2022  ? ?  ? ? ?Assessment/Plan: ?Acute metabolic encephalopathy: Extensive work-up completed-per neurology-this is likely psychogenic etiology-in response to significant marital stressors at home.Only answers some questions appropriately-keeps repeating sentences-continues to be very suspicious and paranoid-we will await further recommendations from psychiatry team. ? ?Asymptomatic bacteriuria: Suspect urine culture results more suggestive of asymptomatic bacteriuria rather than a UTI. Patient had 2 different organisms in urine culture within a week in spite of treatment.  She was initially on Rocephin-plans were to start ampicillin but she has a documented allergy to PCN-hence after discussion with pharmacy- was given her 1 dose of fosfomycin.   ? ?Possible aspiration PNA: Occurred on 5/1 night-while on BiPAP-briefly required 12 L of oxygen-was quickly suctioned with significant improvement.  CXR with either PNA or pulm edema.  Remains stable on Ancef-on 2 L of oxygen-SLP following-being started on a dysphagia 1 diet.   ? ?Chronic hypoxic respiratory failure: Per family-patient was discharged on home O2 during her most recent hospitalization.  Stable on 2 L of oxygen. ? ?OSA: On BiPAP-reasonable to hold due to aspiration episode.  Will use oxygen instead. ? ?Hyponatremia: Minimal/mild-doubt this is of any clinical significance. ? ?Chronic HFpEF: Volume status stable-stop Aldactone-and  will use IV Lasix to ensure she is in negative balance-chest x-ray shows possible pulmonary edema. ? ?HTN: BP stable on metoprolol ? ?HLD: Statin ? ?Debility/deconditioning: Due to acute illness-recommendations from PT are for SNF. ? ?Morbid  Obesity: ?Estimated body mass index is 40.74 kg/m? as calculated from the following: ?  Height as of 11/21/21: 5\' 1"  (1.549 m). ?  Weight as of 02/05/22: 97.8 kg.  ? ?Code status: ?  Code Status: Full Code  ? ?DVT Prophylaxis: Prophylactic Lovenox  ? ?Family Communication: Spouse at bedside. ? ? ?Disposition Plan: ?Status is: Inpatient ?Remains inpatient appropriate because: Continues to be confused-now aspirated overnight-on Unasyn.  Not yet stable for discharge. ?  ?Planned Discharge Destination: SNF-probably will require geriatric psychiatry placement. ? ? ?Diet: ?Diet Order   ? ?       ?  DIET - DYS 1 Room service appropriate? Yes; Fluid consistency: Thin  Diet effective now       ?  ? ?  ?  ? ?  ?  ? ? ?Antimicrobial agents: ?Anti-infectives (From admission, onward)  ? ? Start     Dose/Rate Route Frequency Ordered Stop  ? 02/13/22 1400  ceFAZolin (ANCEF) IVPB 2g/100 mL premix       ? 2 g ?200 mL/hr over 30 Minutes Intravenous Every 8 hours 02/13/22 0951 02/18/22 1359  ? 02/08/22 1530  fosfomycin (MONUROL) packet 3 g       ? 3 g Oral  Once 02/08/22 1430 02/08/22 1548  ? 02/07/22 1500  cefTRIAXone (ROCEPHIN) 1 g in sodium chloride 0.9 % 100 mL IVPB  Status:  Discontinued       ? 1 g ?200 mL/hr over 30 Minutes Intravenous Every 24 hours 02/07/22 1344 02/08/22 1412  ? 02/07/22 1200  cefTRIAXone (ROCEPHIN) 1 g in sodium chloride 0.9 % 100 mL IVPB  Status:  Discontinued       ? 1 g ?200 mL/hr over 30 Minutes Intravenous Every 24 hours 02/06/22 1328 02/06/22 1729  ? 02/06/22 1800  cefTRIAXone (ROCEPHIN) 1 g in sodium chloride 0.9 % 100 mL IVPB       ? 1 g ?200 mL/hr over 30 Minutes Intravenous  Once 02/06/22 1729 02/06/22 1938  ? ?  ? ? ? ?MEDICATIONS: ?Scheduled Meds: ? aspirin EC  81 mg Oral Daily  ? atorvastatin  40 mg Oral QHS  ? enoxaparin (LOVENOX) injection  0.5 mg/kg Subcutaneous Q24H  ? OLANZapine zydis  5 mg Oral q morning  ? Or  ? haloperidol lactate  2 mg Intravenous q morning  ? melatonin  3 mg Oral q1800  ?  metoprolol succinate  12.5 mg Oral q morning  ? OLANZapine zydis  15 mg Oral QHS  ? sodium chloride flush  3 mL Intravenous Q12H  ? thiamine  100 mg Oral Daily  ? ?Continuous Infusions: ?  ceFAZolin (ANCEF) IV 2 g (02/14/22 04/16/22)  ? ? ? ?PRN Meds:.acetaminophen **OR** acetaminophen, albuterol, hydrocortisone ? ? ?I have personally reviewed following labs and imaging studies ? ?LABORATORY DATA: ?CBC: ?Recent Labs  ?Lab 02/08/22 ?0107 02/09/22 ?02/11/22 02/13/22 ?0930  ?WBC 4.2 4.5 6.0  ?NEUTROABS 2.8  --  4.0  ?HGB 13.6 13.8 15.3*  ?HCT 44.6 46.9* 51.3*  ?MCV 78.2* 78.0* 77.8*  ?PLT 239 259 283  ? ? ? ?Basic Metabolic Panel: ?Recent Labs  ?Lab 02/08/22 ?0107 02/09/22 ?02/11/22 02/11/22 ?0052 02/13/22 ?0930 02/14/22 ?0050  ?NA 133* 137 133* 140 146*  ?K 3.5 3.9 3.6  4.7 3.7  ?CL 85* 88* 88* 91* 96*  ?CO2 39* 38* 37* 37* 41*  ?GLUCOSE 107* 95 98 118* 97  ?BUN 10 10 6* 17 18  ?CREATININE 0.47 0.56 0.54 0.74 0.70  ?CALCIUM 8.9 9.5 9.1 10.1 9.9  ? ? ? ?GFR: ?Estimated Creatinine Clearance: 77 mL/min (by C-G formula based on SCr of 0.7 mg/dL). ? ?Liver Function Tests: ?Recent Labs  ?Lab 02/09/22 ?0626 02/13/22 ?0930  ?AST 26 28  ?ALT 20 22  ?ALKPHOS 49 51  ?BILITOT 0.7 0.5  ?PROT 6.4* 7.3  ?ALBUMIN 3.0* 3.3*  ? ? ?No results for input(s): LIPASE, AMYLASE in the last 168 hours. ? ?No results for input(s): AMMONIA in the last 168 hours. ? ? ?Coagulation Profile: ?No results for input(s): INR, PROTIME in the last 168 hours. ? ?Cardiac Enzymes: ?No results for input(s): CKTOTAL, CKMB, CKMBINDEX, TROPONINI in the last 168 hours. ? ?BNP (last 3 results) ?No results for input(s): PROBNP in the last 8760 hours. ? ?Lipid Profile: ?No results for input(s): CHOL, HDL, LDLCALC, TRIG, CHOLHDL, LDLDIRECT in the last 72 hours. ? ?Thyroid Function Tests: ?No results for input(s): TSH, T4TOTAL, FREET4, T3FREE, THYROIDAB in the last 72 hours. ? ?Anemia Panel: ?No results for input(s): VITAMINB12, FOLATE, FERRITIN, TIBC, IRON, RETICCTPCT in the last  72 hours. ? ? ?Urine analysis: ?   ?Component Value Date/Time  ? COLORURINE YELLOW 02/06/2022 1425  ? APPEARANCEUR CLEAR 02/06/2022 1425  ? LABSPEC 1.023 02/06/2022 1425  ? PHURINE 7.0 02/06/2022 1425

## 2022-02-14 NOTE — Progress Notes (Signed)
IV team consult ordered. Upon walking into room, pt. Screamed and clearly did not want me near her. Daughter was in the room attempting to calm her to little avail. Pt. Stated "I don't want any of your medicine and don't come near me". Notified RN to order consult again once patient was calm for IV insertion. ?

## 2022-02-14 NOTE — Progress Notes (Signed)
?  X-cover Note: ?Repeated calls over the last several night regarding pt's agitation. ? ?Received 20 IM geodon at 3 AM today. RN reported that this had little effect on pt's agitation. ? ?Pt has pulled out her IV. Won't let RN get close to her. ? ?Did receive 5 mg IV haldol at 1800. Rn report this did nothing to help with pt's agitation. ? ?Not wanting to mix haldol with zyprexa. ? ?Will give 2 mg IM ativan. ? ?At this point, I think pt needs geripsych admission for agitation. ? ? ?Kristopher Oppenheim, DO ?[facility] ? ?

## 2022-02-14 NOTE — Consult Note (Signed)
[facility] Face-to-Face Psychiatry Consult  ? ?Reason for Consult:  AMS and psychosis ?Referring Physician:  Dr. Sloan Leiter ?Patient Identification: Tamara Russell ?MRN:  [id number] ?Principal Diagnosis: Acute metabolic encephalopathy ?Diagnosis:  Principal Problem: ?  Acute metabolic encephalopathy ?Active Problems: ?  Acute respiratory failure with hypoxia and hypercapnia (HCC) ?  Hypertension ?  PUD (peptic ulcer disease) ?  Elevated troponin ?  History of UTI ?  Hypokalemia ?  Class 3 severe obesity due to excess calories with serious comorbidity and body mass index (BMI) of 40.0 to 44.9 in adult [facility]) ?  Microcytosis ?  Diastolic dysfunction ? ? ?Total Time spent with patient: 30 minutes ? ?Subjective:   ?Tamara Russell is a 61 y.o. female patient  Admitted on 02/06/2022 for altered mental status and psychosis. Psychiatry consulted for AMS and psychosis.  ?Patient seen and chart reviewed- has refused multiple doses of meds. Did get olanzpaine 5 mg this AM without apparent effect. Qtc 459 on updated EKG.  ? ? ?Saw pt in afternoon today - had waited to see if any effect from AM olanzapine. Continues to be overtly suspicious/paranoid, asking to see badge several times, made inappropriate comments on this author's physical appearance through exam. Yells out asking this author to leave but answers orientation questions mostly appropriately when interrupted. Again unable to repeat BCAM (pt becomes uninterruptable, yelling, etc). Terminated interview early due to agitation; again could not perform brief physical exam. Pt mildly diaphoretic during exam.  ? ?Collateral ?Spoke to son - have been periods where she does not recognize family members. Behavior very atypical for pt. Discussed plan in broad strokes (give sedative to enhance sleep + hopefully help brain heal). Discussed that many of her psychosocial stressors will remain after confusion/delirium resolves.  ? ?HPI :   ?Tamara Russell is a 61 y.o. female  patient  Admitted on 02/06/2022 for altered mental status and psychosis. Psychiatry consulted for AMS and psychosis.  ? ? ? ?Past Medical History:  ?Past Medical History:  ?Diagnosis Date  ? Acid reflux   ? Complication of anesthesia   ? slow to wake up  ? History of COVID-19   ? Hypertension   ?  ?Past Surgical History:  ?Procedure Laterality Date  ? ABDOMINAL HYSTERECTOMY    ? BIOPSY  11/17/2018  ? Procedure: BIOPSY;  Surgeon: Jerene Bears, MD;  Location: [facility] ENDOSCOPY;  Service: Gastroenterology;;  ? BREAST EXCISIONAL BIOPSY Left   ? benign  ? BREAST EXCISIONAL BIOPSY Right   ? benign  ? ESOPHAGOGASTRODUODENOSCOPY (EGD) WITH PROPOFOL N/A 11/17/2018  ? Procedure: ESOPHAGOGASTRODUODENOSCOPY (EGD) WITH PROPOFOL;  Surgeon: Jerene Bears, MD;  Location: [facility] ENDOSCOPY;  Service: Gastroenterology;  Laterality: N/A;  ? ?Family History:  ?Family History  ?Problem Relation Age of Onset  ? Cancer Father 13  ?     stomach  ? Hypertension Father   ? Diabetes Maternal Grandmother   ? Colon cancer Maternal Grandfather   ? Diabetes Paternal Grandmother   ? Colon cancer Paternal Grandmother   ? ?Family history ?No family psychiatric history disclosed.  ?The patient's family history includes Cancer (age of onset: 12) in her father; Colon cancer in her maternal grandfather and paternal grandmother; Diabetes in her maternal grandmother and paternal grandmother; Hypertension in her father. ?  ? ? ?Social History:  ?Social History  ? ?Substance and Sexual Activity  ?Alcohol Use No  ? Alcohol/week: 0.0 standard drinks  ?   ?Social History  ? ?Substance and Sexual  Activity  ?Drug Use No  ?  ?Social History  ? ?Socioeconomic History  ? Marital status: Married  ?  Spouse name: Not on file  ? Number of children: Not on file  ? Years of education: Not on file  ? Highest education level: Not on file  ?Occupational History  ? Occupation: unemployed  ?Tobacco Use  ? Smoking status: Former  ?  Packs/day: 0.50  ?  Years: 20.00  ?  Pack years: 10.00  ?   Types: Cigarettes  ?  Start date: 04/30/1975  ?  Quit date: 04/30/1995  ?  Years since quitting: 26.8  ? Smokeless tobacco: Never  ?Vaping Use  ? Vaping Use: Never used  ?Substance and Sexual Activity  ? Alcohol use: No  ?  Alcohol/week: 0.0 standard drinks  ? Drug use: No  ? Sexual activity: Not on file  ?Other Topics Concern  ? Not on file  ?Social History Narrative  ? Not on file  ? ?Social Determinants of Health  ? ?Financial Resource Strain: Not on file  ?Food Insecurity: Not on file  ?Transportation Needs: Not on file  ?Physical Activity: Not on file  ?Stress: Not on file  ?Social Connections: Not on file  ? ?Additional Social History: ?  ? ?Allergies:   ?Allergies  ?Allergen Reactions  ? Penicillins Other (See Comments)  ?  Childhood allergy. Pt does not remember reaction  ? ? ?Labs:  ?Results for orders placed or performed during the hospital encounter of 02/06/22 (from the past 48 hour(s))  ?CBC with Differential/Platelet     Status: Abnormal  ? Collection Time: 02/13/22  9:30 AM  ?Result Value Ref Range  ? WBC 6.0 4.0 - 10.5 K/uL  ? RBC 6.59 (H) 3.87 - 5.11 MIL/uL  ? Hemoglobin 15.3 (H) 12.0 - 15.0 g/dL  ? HCT 51.3 (H) 36.0 - 46.0 %  ? MCV 77.8 (L) 80.0 - 100.0 fL  ? MCH 23.2 (L) 26.0 - 34.0 pg  ? MCHC 29.8 (L) 30.0 - 36.0 g/dL  ? RDW 17.5 (H) 11.5 - 15.5 %  ? Platelets 283 150 - 400 K/uL  ? nRBC 0.0 0.0 - 0.2 %  ? Neutrophils Relative % 67 %  ? Neutro Abs 4.0 1.7 - 7.7 K/uL  ? Lymphocytes Relative 20 %  ? Lymphs Abs 1.2 0.7 - 4.0 K/uL  ? Monocytes Relative 11 %  ? Monocytes Absolute 0.7 0.1 - 1.0 K/uL  ? Eosinophils Relative 1 %  ? Eosinophils Absolute 0.0 0.0 - 0.5 K/uL  ? Basophils Relative 1 %  ? Basophils Absolute 0.1 0.0 - 0.1 K/uL  ? Immature Granulocytes 0 %  ? Abs Immature Granulocytes 0.02 0.00 - 0.07 K/uL  ?  Comment: Performed at [facility], [address] [place], [place] [address]  ?Comprehensive metabolic panel     Status: Abnormal  ? Collection Time: 02/13/22  9:30 AM  ?Result Value  Ref Range  ? Sodium 140 135 - 145 mmol/L  ? Potassium 4.7 3.5 - 5.1 mmol/L  ? Chloride 91 (L) 98 - 111 mmol/L  ? CO2 37 (H) 22 - 32 mmol/L  ? Glucose, Bld 118 (H) 70 - 99 mg/dL  ?  Comment: Glucose reference range applies only to samples taken after fasting for at least 8 hours.  ? BUN 17 8 - 23 mg/dL  ? Creatinine, Ser 0.74 0.44 - 1.00 mg/dL  ? Calcium 10.1 8.9 - 10.3 mg/dL  ? Total Protein 7.3  6.5 - 8.1 g/dL  ? Albumin 3.3 (L) 3.5 - 5.0 g/dL  ? AST 28 15 - 41 U/L  ? ALT 22 0 - 44 U/L  ? Alkaline Phosphatase 51 38 - 126 U/L  ? Total Bilirubin 0.5 0.3 - 1.2 mg/dL  ? GFR, Estimated >60 >60 mL/min  ?  Comment: (NOTE) ?Calculated using the CKD-EPI Creatinine Equation (2021) ?  ? Anion gap 12 5 - 15  ?  Comment: Performed at [facility], [place] [address] [place], [place] [address]  ?Glucose, capillary     Status: None  ? Collection Time: 02/13/22  4:18 PM  ?Result Value Ref Range  ? Glucose-Capillary 95 70 - 99 mg/dL  ?  Comment: Glucose reference range applies only to samples taken after fasting for at least 8 hours.  ?Basic metabolic panel     Status: Abnormal  ? Collection Time: 02/14/22 12:50 AM  ?Result Value Ref Range  ? Sodium 146 (H) 135 - 145 mmol/L  ? Potassium 3.7 3.5 - 5.1 mmol/L  ?  Comment: DELTA CHECK NOTED  ? Chloride 96 (L) 98 - 111 mmol/L  ? CO2 41 (H) 22 - 32 mmol/L  ? Glucose, Bld 97 70 - 99 mg/dL  ?  Comment: Glucose reference range applies only to samples taken after fasting for at least 8 hours.  ? BUN 18 8 - 23 mg/dL  ? Creatinine, Ser 0.70 0.44 - 1.00 mg/dL  ? Calcium 9.9 8.9 - 10.3 mg/dL  ? GFR, Estimated >60 >60 mL/min  ?  Comment: (NOTE) ?Calculated using the CKD-EPI Creatinine Equation (2021) ?  ? Anion gap 9 5 - 15  ?  Comment: Performed at [facility], [place] [address], [place], [place] [address]  ? ? ?Current Facility-Administered Medications  ?Medication Dose Route Frequency Provider Last Rate Last Admin  ? acetaminophen (TYLENOL) tablet 650 mg  650 mg Oral Q6H PRN Norval Morton, MD      ? Or  ? acetaminophen (TYLENOL) suppository 650 mg  650 mg Rectal Q6H PRN Fuller Plan A, MD      ? albuterol (PROVENTIL) (2.5 MG/3ML) 0.083% nebulizer solution 2.5 mg  2.5 mg Nebulizati

## 2022-02-14 NOTE — Progress Notes (Signed)
?  X-cover Note: ?Bedside RN reports pt still very agitated. Removing lines and interfering with her care.  Pt did not take Zyprexa this evening. Last dose of haldol was 11 am on 02-13-2022. ? ?RN not sure if pt will let her approach her PIV site. ? ?Will order 1 dose of IM geodon. ? ? ?Carollee Herter, DO ?[facility] ? ?

## 2022-02-15 DIAGNOSIS — R778 Other specified abnormalities of plasma proteins: Secondary | ICD-10-CM | POA: Diagnosis not present

## 2022-02-15 DIAGNOSIS — J9602 Acute respiratory failure with hypercapnia: Secondary | ICD-10-CM | POA: Diagnosis not present

## 2022-02-15 DIAGNOSIS — J9601 Acute respiratory failure with hypoxia: Secondary | ICD-10-CM | POA: Diagnosis not present

## 2022-02-15 DIAGNOSIS — I5189 Other ill-defined heart diseases: Secondary | ICD-10-CM | POA: Diagnosis not present

## 2022-02-15 DIAGNOSIS — G9341 Metabolic encephalopathy: Secondary | ICD-10-CM | POA: Diagnosis not present

## 2022-02-15 NOTE — Consult Note (Addendum)
?  Patient continues to exhibit paranoia and agitation. Patient refuses to allow this provider to touch/examine her even with her daughter present. Patient also would not allow her daughter to engage in conversation with me "you talking too much. No don't be talking to her." Despite telling patient the importance of physical examine (checking for s/e medications) she continued to decline. At one time she was noted to be coughing and choking, o2 sats decreased in correlation with coughing attempted to suction her with yonker at bedside she also declined. Patient remains confused and altered at this time, she is unable to remember how many children she has or that she is in the hospital.  ? ?Reviewed current plans with daughter to include ongoing use of Haldol/ativan to help target psychosis, paranoia and delusions. Case was discussed with Dr. Jerral Ralph, to consider neurology consult for further work-up as she has no previous psychiatric history prior to 10 days ago. Daughter also denies any paternal or maternal history of psychiatric illness. She questions he elevated CO2 levels and positive BAL levels on admission.She states her mother does not drink alcohol.  BAL 65 on admission.  ? ?Psychiatry will continue to follow. At this time patient appears to be displaying a positive therapeutic response to haldol as she is observed on two occasions to be sleeping soundly, less agitated, and combative. She remains floridly psychotic at this time and continues to require medication to treat these symptoms. Please consider that patient is naive to antipsychotics and high risk to develop side effects.  ? ?-Limit use to one antipsychotic at a time.  ?-Encourage presence of family members to help re-orient patient and allow her to be safe.  ?- Continue delirium precautions.  ?-Consider consulting neurology   ?-Encourage nursing documentation to include progress notes, to describe patients behavior and response to medications when  administered.  ?Final disposition has not been made at this time. Psychiatry will continue to follow daily for medication management and crisis stabilization.  ? ?

## 2022-02-15 NOTE — Consult Note (Addendum)
NEUROLOGY CONSULTATION NOTE  ? ?Date of service: Feb 15, 2022 ?Patient Name: Tamara Russell ?MRN:  [id number] ?DOB:  January 07, 1961 ?Reason for consult: "acute encephalopathy" ?_ _ _   _ __   _ __ _ _  __ __   _ __   __ _ ? ?History of Present Illness  ?Tashima Seymone Forlenza is a 61 y.o. female with PMH significant for  has a past medical history of Acid reflux, Complication of anesthesia, History of COVID-19, and Hypertension. who presents with to [facility] on 04/24 with AMS.  ? ?Patient was previously hospitalized from 04/19-04/23 for UTI and treated with Rocephin and transitioned to cephalexin. She developed AMS during that hospitalization that did not resolve at d/c. She was eventually brought back to the ED when she was found outside in the morning with just her night gown on. Patient was found to have decreased appetite with associated nausea. She has also been experiencing visual hallucination including seeing her father who had previously passed away. Patient daughter states that she does not use illicit drugs or ETOH however, the patient endorsed drinking ETOH the night before presentation. This coincides with an elevated ETOH on admission. Patient has had some increased stress at home, presumably due to marital issues.  ? ?Throughout the course of the hospitalization, the patient has had an extensive work up that has been unrevealing. She remains altered with paranoia, visual/auditory hallucinations, and delusions. Her daughter denies and personal or family history of autoimmune disease, behavioral health disease or cancer. Patient has also had prolonged period without sleeping recently.   ?  ?ROS  ? ?See above ?Past History  ? ?Past Medical History:  ?Diagnosis Date  ? Acid reflux   ? Complication of anesthesia   ? slow to wake up  ? History of COVID-19   ? Hypertension   ? ?Past Surgical History:  ?Procedure Laterality Date  ? ABDOMINAL HYSTERECTOMY    ? BIOPSY  11/17/2018  ? Procedure: BIOPSY;  Surgeon: Beverley Fiedler, MD;  Location: [facility] ENDOSCOPY;  Service: Gastroenterology;;  ? BREAST EXCISIONAL BIOPSY Left   ? benign  ? BREAST EXCISIONAL BIOPSY Right   ? benign  ? ESOPHAGOGASTRODUODENOSCOPY (EGD) WITH PROPOFOL N/A 11/17/2018  ? Procedure: ESOPHAGOGASTRODUODENOSCOPY (EGD) WITH PROPOFOL;  Surgeon: Beverley Fiedler, MD;  Location: [facility] ENDOSCOPY;  Service: Gastroenterology;  Laterality: N/A;  ? ?Family History  ?Problem Relation Age of Onset  ? Cancer Father 20  ?     stomach  ? Hypertension Father   ? Diabetes Maternal Grandmother   ? Colon cancer Maternal Grandfather   ? Diabetes Paternal Grandmother   ? Colon cancer Paternal Grandmother   ? ?Social History  ? ?Socioeconomic History  ? Marital status: Married  ?  Spouse name: Not on file  ? Number of children: Not on file  ? Years of education: Not on file  ? Highest education level: Not on file  ?Occupational History  ? Occupation: unemployed  ?Tobacco Use  ? Smoking status: Former  ?  Packs/day: 0.50  ?  Years: 20.00  ?  Pack years: 10.00  ?  Types: Cigarettes  ?  Start date: 04/30/1975  ?  Quit date: 04/30/1995  ?  Years since quitting: 26.8  ? Smokeless tobacco: Never  ?Vaping Use  ? Vaping Use: Never used  ?Substance and Sexual Activity  ? Alcohol use: No  ?  Alcohol/week: 0.0 standard drinks  ? Drug use: No  ? Sexual activity: Not on  file  ?Other Topics Concern  ? Not on file  ?Social History Narrative  ? Not on file  ? ?Social Determinants of Health  ? ?Financial Resource Strain: Not on file  ?Food Insecurity: Not on file  ?Transportation Needs: Not on file  ?Physical Activity: Not on file  ?Stress: Not on file  ?Social Connections: Not on file  ? ?Allergies  ?Allergen Reactions  ? Penicillins Other (See Comments)  ?  Childhood allergy. Pt does not remember reaction  ? ? ?Medications  ? ?Medications Prior to Admission  ?Medication Sig Dispense Refill Last Dose  ? acetaminophen (TYLENOL) 500 MG tablet Take 1 tablet (500 mg total) by mouth every 6 (six) hours as needed for  moderate pain. Reported on 04/21/2016 30 tablet 0 02/05/2022  ? aspirin EC 81 MG tablet Take 81 mg by mouth daily as needed ("overworking heart").   Past Week  ? Multiple Vitamins-Minerals (CENTRUM SILVER ADULT 50+ PO) Take 1 tablet by mouth daily.   Past Week  ? atorvastatin (LIPITOR) 40 MG tablet Take 1 tablet (40 mg total) by mouth daily. (Patient not taking: Reported on 02/06/2022) 30 tablet 0 Not Taking  ? [EXPIRED] cephALEXin (KEFLEX) 500 MG capsule Take 1 capsule (500 mg total) by mouth 4 (four) times daily for 4 days. (Patient not taking: Reported on 02/06/2022) 16 capsule 0 Not Taking  ? metoprolol succinate (TOPROL-XL) 25 MG 24 hr tablet Take 0.5 tablets (12.5 mg total) by mouth every morning for 30 doses. (Patient not taking: Reported on 02/06/2022) 15 tablet 0 Not Taking  ? spironolactone (ALDACTONE) 25 MG tablet Take 1 tablet (25 mg total) by mouth daily. (Patient not taking: Reported on 02/06/2022) 30 tablet 0 Not Taking  ?  ? ?Vitals  ? ?Vitals:  ? 02/15/22 0200 02/15/22 0400 02/15/22 0812 02/15/22 1242  ?BP: 102/73 112/76 (!) 148/81 115/82  ?Pulse:   (!) 116 91  ?Resp: 20 20 16  (!) 23  ?Temp:  98.4 ?F (36.9 ?C) 98.3 ?F (36.8 ?C) 98 ?F (36.7 ?C)  ?TempSrc:   Oral Oral  ?SpO2: 94% 94% 94% 92%  ?  ? ?There is no height or weight on file to calculate BMI. ? ?Physical Exam  ? ?General: Laying comfortably in bed; in no acute distress. Delirious but orient to person, place, time and event ?HENT: Normal oropharynx and mucosa. Normal external appearance of ears and nose.  ?Neck: Supple, no pain or tenderness  ?CV: unable to perform ?Pulmonary: Symmetric Chest rise. Normal respiratory effort.  ?Abdomen: unable to perform ?Ext: No cyanosis, edema, or deformity  ?Skin: No rash. Normal palpation of skin.   ?Musculoskeletal: Normal digits and nails by inspection. No clubbing.  ? ?Neurologic Examination  ?Mental status/Cognition: Alert, oriented to self, place, month and year, poor attention.  ?She is unable to spell  world backwards, when asked to give the number of quarters in $2.75 she is able to say it is 8+3, but then I have to prompt her to ask her what is 8+3 and she does give the correct answer of 11.  She is able to correctly answer orientation questions. ?Speech/language: Fluent, comprehension intact, object naming intact, repetition intact.  ?Cranial nerves  grossly intact ?Motor:  ?she moves all extremities well, has full symmetric strength to confrontation in the upper extremities, does not cooperate in the lower extremities but does move both to command ? ?Reflexes: ?No hyperreflexia ? ?Sensation: ?Sensation grossly intact bilaterally ? ?Coordination/Complex Motor:  ?She does not cooperate with formal  testing ?She has some tremulousness, that is clearly distractible. ? ?Labs  ? ?CBC:  ?Recent Labs  ?Lab 02/09/22 ?[id number] 02/13/22 ?0930  ?WBC 4.5 6.0  ?NEUTROABS  --  4.0  ?HGB 13.8 15.3*  ?HCT 46.9* 51.3*  ?MCV 78.0* 77.8*  ?PLT 259 283  ? ? ?Basic Metabolic Panel:  ?Lab Results  ?Component Value Date  ? NA 146 (H) 02/14/2022  ? K 3.7 02/14/2022  ? CO2 41 (H) 02/14/2022  ? GLUCOSE 97 02/14/2022  ? BUN 18 02/14/2022  ? CREATININE 0.70 02/14/2022  ? CALCIUM 9.9 02/14/2022  ? GFRNONAA >60 02/14/2022  ? GFRAA >60 12/03/2019  ? ?Lipid Panel:  ?Lab Results  ?Component Value Date  ? LDLCALC 80 02/02/2022  ? ?HgbA1c:  ?Lab Results  ?Component Value Date  ? HGBA1C 5.6 04/21/2016  ? ?Urine Drug Screen:  ?   ?Component Value Date/Time  ? LABOPIA NONE DETECTED 02/06/2022 1425  ? COCAINSCRNUR NONE DETECTED 02/06/2022 1425  ? LABBENZ NONE DETECTED 02/06/2022 1425  ? AMPHETMU NONE DETECTED 02/06/2022 1425  ? THCU NONE DETECTED 02/06/2022 1425  ? LABBARB NONE DETECTED 02/06/2022 1425  ?  ?Alcohol Level  ?   ?Component Value Date/Time  ? ETH <10 02/11/2022 0052  ? ? ?CT Head without contrast: ?IMPRESSION: ?1. No evidence of acute intracranial abnormality. ?2. Mild chronic small vessel ischemic changes in the cerebral  white ?matter. ? ? ?MRI Brain WO Contrast: ?IMPRESSION: ?No acute intracranial pathology. ? ? ?Impression  ? ?Acute Encephalopathy  ?Patient presents with acute encephalopathy with unclear etiology.  Patient apparently had a recen

## 2022-02-15 NOTE — Progress Notes (Addendum)
?      ?                 PROGRESS NOTE ? ?      ?PATIENT DETAILS ?Name: Tamara Russell ?Age: 61 y.o. ?Sex: female ?Date of Birth: [birth year] ?Admit Date: 02/06/2022 ?Admitting Physician Clydie Braunondell A Smith, MD ?ZOX:WRUEAPCP:Bland, Adrian SaranVeita, MD ? ?Brief Summary: ?Patient is a 61 y.o.  female morbid obesity, HTN, GERD-who was just discharged from this facility on 4/23-presented to the hospital on 4/24 with altered mental status. ? ?Significant events: ?4/19-4/23>> hospitalization for chest pain, confusion-UTI.  Per notes mental status stable at discharge. ?4/24> presented to the ED for confusion-[facility]. ?4/28>>Neuro eval-marital stress possibly causing AMS-psych consult recommended ?5/01>> episode of aspiration-bradycardia/worsening hypoxemia-improved after suctioning.CXR with bibasilar opacities-IV Ancef started ?5/04>>Neuro re-eval per psych rec's-?autoimmune process causing AMS ? ?Significant studies: ?4/20>> HIV: Negative ?4/20>> TSH: Not elevated ?4/24>> CT head: No acute intracranial abnormality ?4/24>> CXR: No PNA ?4/24>> RPR: Nonreactive ?4/24>> NH4: Normal ?4/24>> alcohol level: 65 ?4/26>> EEG: No seizures ?4/26>> vitamin B12: 2964 ?4/26>> vitamin B1: Normal ?4/27>> acute hepatitis serology: Negative ?4/27>>MRI brain: no acute abnormalities ?4/29>> lead levels:Normal ?5/01>>CXR:bibasilar opacities ? ?Significant microbiology data: ?4/20>> urine culture: E. coli/Citrobacter ?4/24>> COVID/influenza PCR: Negative ?4/24>> urine culture: Enterococcus faecalis ?4/24>> blood culture: Negative ? ?Procedures: ?None ? ?Consults: ?Neurology, psychiatry ? ?Subjective: ?Remains very suspicious and paranoid-she accused me of being part of the [employer] today.  Not letting me examine her-pushing me away.  Spouse at bedside is apologizing. ? ?Objective: ?Vitals: ?Blood pressure 115/82, pulse 91, temperature 98 ?F (36.7 ?C), temperature source Oral, resp. rate (!) 23, SpO2 92 %.  ? ?Exam: ?Gen Exam:not in any distress-but very  paranoid/suspicious-pushes me away. ?HEENT:atraumatic, normocephalic ?Chest: B/L clear to auscultation anteriorly ?CVS:S1S2 regular ?Abdomen:soft non tender, non distended ?Extremities:no edema ?Neurology: Seems to be moving all 4 extremities ?Skin: no rash  ? ?Pertinent Labs/Radiology: ? ?  Latest Ref Rng & Units 02/13/2022  ?  9:30 AM 02/09/2022  ? 12:33 AM 02/08/2022  ?  1:07 AM  ?CBC  ?WBC 4.0 - 10.5 K/uL 6.0   4.5   4.2    ?Hemoglobin 12.0 - 15.0 g/dL 54.015.3   98.113.8   19.113.6    ?Hematocrit 36.0 - 46.0 % 51.3   46.9   44.6    ?Platelets 150 - 400 K/uL 283   259   239    ?  ?Lab Results  ?Component Value Date  ? NA 146 (H) 02/14/2022  ? K 3.7 02/14/2022  ? CL 96 (L) 02/14/2022  ? CO2 41 (H) 02/14/2022  ? ?  ? ? 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  Dr. Amada JupiterKirkpatrick to see later today. ? ?Asymptomatic bacteriuria: Suspect urine culture results more suggestive of asymptomatic bacteriuria rather than a UTI. Patient had 2 different organisms in urine culture within a week in spite of treatment.  She was initially on Rocephin-plans were to start ampicillin but she has a documented allergy to PCN-hence after discussion with pharmacy- was given her 1 dose of fosfomycin.   ? ?Possible aspiration PNA: Occurred on 5/1 night-while on BiPAP-briefly required 12 L of oxygen-was quickly suctioned with significant improvement.  CXR with either PNA or pulm edema.  Remains stable on Ancef-on  2 L of oxygen-SLP following-being started on a dysphagia 1 diet.   ? ?Chronic hypoxic respiratory failure: Per family-patient was discharged on home O2 during her most recent  hospitalization.  Stable on 2 L of oxygen. ? ?OSA: On BiPAP-reasonable to hold due to aspiration episode.  Will use oxygen instead. ? ?Hyponatremia: Minimal/mild-doubt this is of any clinical significance. ? ?Chronic HFpEF: Volume status stable ? ?HTN: BP stable on metoprolol ? ?HLD: Statin ? ?Debility/deconditioning: Due to acute illness-recommendations from PT are for SNF. ? ?Morbid Obesity: ?Estimated body mass index is 40.74 kg/m? as calculated from the following: ?  Height as of 11/21/21: 5\' 1"  (1.549 m). ?  Weight as of 02/05/22: 97.8 kg.  ? ?Code status: ?  Code Status: Full Code  ? ?DVT Prophylaxis: Prophylactic Lovenox  ? ?Family Communication: Spouse at bedside. ? ? ?Disposition Plan: ?Status is: Inpatient ?Remains inpatient appropriate because: Continues to be confused-now aspirated overnight-on Unasyn.  Not yet stable for discharge. ?  ?Planned Discharge Destination: SNF-probably will require geriatric psychiatry placement. ? ? ?Diet: ?Diet Order   ? ?       ?  DIET - DYS 1 Room service appropriate? Yes; Fluid consistency: Thin  Diet effective now       ?  ? ?  ?  ? ?  ?  ? ? ?Antimicrobial agents: ?Anti-infectives (From admission, onward)  ? ? Start     Dose/Rate Route Frequency Ordered Stop  ? 02/13/22 1400  ceFAZolin (ANCEF) IVPB 2g/100 mL premix       ? 2 g ?200 mL/hr over 30 Minutes Intravenous Every 8 hours 02/13/22 0951 02/18/22 1359  ? 02/08/22 1530  fosfomycin (MONUROL) packet 3 g       ? 3 g Oral  Once 02/08/22 1430 02/08/22 1548  ? 02/07/22 1500  cefTRIAXone (ROCEPHIN) 1 g in sodium chloride 0.9 % 100 mL IVPB  Status:  Discontinued       ? 1 g ?200 mL/hr over 30 Minutes Intravenous Every 24 hours 02/07/22 1344 02/08/22 1412  ? 02/07/22 1200  cefTRIAXone (ROCEPHIN) 1 g in sodium chloride 0.9 % 100 mL IVPB  Status:  Discontinued       ? 1 g ?200 mL/hr over 30 Minutes Intravenous Every 24 hours 02/06/22 1328 02/06/22 1729  ? 02/06/22 1800  cefTRIAXone (ROCEPHIN) 1 g in sodium chloride 0.9 % 100 mL  IVPB       ? 1 g ?200 mL/hr over 30 Minutes Intravenous  Once 02/06/22 1729 02/06/22 1938  ? ?  ? ? ? ?MEDICATIONS: ?Scheduled Meds: ? aspirin EC  81 mg Oral Daily  ? atorvastatin  40 mg Oral QHS  ? enoxaparin (LOVENOX) injection  0.5 mg/kg Subcutaneous Q24H  ? haloperidol lactate  5 mg Intravenous BID  ? And  ? LORazepam  2 mg Intravenous BID  ? melatonin  3 mg Oral q1800  ? metoprolol succinate  12.5 mg Oral q morning  ? sodium chloride flush  3 mL Intravenous Q12H  ? thiamine  100 mg Oral Daily  ? ?Continuous Infusions: ?  ceFAZolin (ANCEF) IV 2 g (02/15/22 0555)  ? ? ? ?PRN Meds:.acetaminophen **OR** acetaminophen, albuterol, hydrocortisone ? ? ?I have personally reviewed following labs and imaging studies ? ?LABORATORY DATA: ?CBC: ?Recent Labs  ?Lab 02/09/22 ?02/11/22 02/13/22 ?0930  ?WBC 4.5 6.0  ?NEUTROABS  --  4.0  ?HGB 13.8 15.3*  ?HCT 46.9* 51.3*  ?MCV 78.0* 77.8*  ?PLT 259 283  ? ? ? ?  Basic Metabolic Panel: ?Recent Labs  ?Lab 02/09/22 ?8841 02/11/22 ?0052 02/13/22 ?0930 02/14/22 ?0050  ?NA 137 133* 140 146*  ?K 3.9 3.6 4.7 3.7  ?CL 88* 88* 91* 96*  ?CO2 38* 37* 37* 41*  ?GLUCOSE 95 98 118* 97  ?BUN 10 6* 17 18  ?CREATININE 0.56 0.54 0.74 0.70  ?CALCIUM 9.5 9.1 10.1 9.9  ? ? ? ?GFR: ?Estimated Creatinine Clearance: 77 mL/min (by C-G formula based on SCr of 0.7 mg/dL). ? ?Liver Function Tests: ?Recent Labs  ?Lab 02/09/22 ?6606 02/13/22 ?0930  ?AST 26 28  ?ALT 20 22  ?ALKPHOS 49 51  ?BILITOT 0.7 0.5  ?PROT 6.4* 7.3  ?ALBUMIN 3.0* 3.3*  ? ? ?No results for input(s): LIPASE, AMYLASE in the last 168 hours. ? ?No results for input(s): AMMONIA in the last 168 hours. ? ? ?Coagulation Profile: ?No results for input(s): INR, PROTIME in the last 168 hours. ? ?Cardiac Enzymes: ?No results for input(s): CKTOTAL, CKMB, CKMBINDEX, TROPONINI in the last 168 hours. ? ?BNP (last 3 results) ?No results for input(s): PROBNP in the last 8760 hours. ? ?Lipid Profile: ?No results for input(s): CHOL, HDL, LDLCALC, TRIG, CHOLHDL,  LDLDIRECT in the last 72 hours. ? ?Thyroid Function Tests: ?No results for input(s): TSH, T4TOTAL, FREET4, T3FREE, THYROIDAB in the last 72 hours. ? ?Anemia Panel: ?No results for input(s): VITAMINB12, FOLATE, FERRITIN

## 2022-02-15 NOTE — Progress Notes (Signed)
OT Cancellation Note ? ?Patient Details ?Name: Tamara Russell ?MRN: [id number] ?DOB: June 10, 1961 ? ? ?Cancelled Treatment:    Reason Eval/Treat Not Completed: Patient declined, no reason specified (Patient declining therapy and was very lethargic) ?Lodema Hong, OTA ?Acute Rehabilitation Services  ?Pager [phone] ?Office [phone] ? ?[place] ?02/15/2022, 2:19 PM ?

## 2022-02-15 NOTE — Consult Note (Signed)
? ?  [facility] CM Inpatient Consult ? ? ?02/15/2022 ? ?Tamara Russell ?1961-03-02 ?[id number] ? ?[facility] Customer service manager [THN]  Accountable Care Organization [ACO] Patient: [name] Medicare ? ?Primary Care Provider: [name], MD ?Is an Independent provider with an Embedded Chronic Care Management program. ? ?No [facility] CM needs assessed. ? ?Charlesetta Shanks, RN BSN CCM ?[facility] [employer] Liaison ? [phone] business mobile phone ?Toll free office [phone]  ?Fax number: [phone] ?[email or web address] ?[email or web address] ? ? ? ?

## 2022-02-15 NOTE — Progress Notes (Signed)
PT Cancellation Note ? ?Patient Details ?Name: Tamara Russell ?MRN: [id number] ?DOB: 03-Dec-1960 ? ? ?Cancelled Treatment:    Reason Eval/Treat Not Completed: Fatigue/lethargy limiting ability to participate ? ?Per RN was given meds for agitation and is now sleeping soundly. Unable to arouse. ? ? ?Arby Barrette, PT ?Acute Rehabilitation Services  ?Pager [phone] ?Office [phone] ? ? ?Jeanie Cooks Natascha Edmonds ?02/15/2022, 10:42 AM ?

## 2022-02-15 NOTE — Progress Notes (Signed)
SLP Cancellation Note ? ?Patient Details ?Name: Tamara Russell ?MRN: [id number] ?DOB: 24-Apr-1961 ? ? ?Cancelled treatment:       Reason Eval/Treat Not Completed: Other (comment) (patient continues to be agitated and refusing all therapies, lethargic from haldol. SLP will continue to follow) ? ?Angela Nevin, MA, CCC-SLP ?Speech Therapy ? ?

## 2022-02-16 DIAGNOSIS — J9602 Acute respiratory failure with hypercapnia: Secondary | ICD-10-CM | POA: Diagnosis not present

## 2022-02-16 DIAGNOSIS — J9601 Acute respiratory failure with hypoxia: Secondary | ICD-10-CM | POA: Diagnosis not present

## 2022-02-16 DIAGNOSIS — G9341 Metabolic encephalopathy: Secondary | ICD-10-CM | POA: Diagnosis not present

## 2022-02-16 DIAGNOSIS — I5189 Other ill-defined heart diseases: Secondary | ICD-10-CM | POA: Diagnosis not present

## 2022-02-16 DIAGNOSIS — R778 Other specified abnormalities of plasma proteins: Secondary | ICD-10-CM | POA: Diagnosis not present

## 2022-02-16 LAB — BASIC METABOLIC PANEL
Anion gap: 7 (ref 5–15)
BUN: 18 mg/dL (ref 8–23)
CO2: 42 mmol/L — ABNORMAL HIGH (ref 22–32)
Calcium: 9.7 mg/dL (ref 8.9–10.3)
Chloride: 94 mmol/L — ABNORMAL LOW (ref 98–111)
Creatinine, Ser: 0.6 mg/dL (ref 0.44–1.00)
GFR, Estimated: >60 mL/min (ref >60)
Glucose, Bld: 107 mg/dL — ABNORMAL HIGH (ref 70–99)
Potassium: 3.8 mmol/L (ref 3.5–5.1)
Sodium: 143 mmol/L (ref 135–145)

## 2022-02-16 MED ORDER — VALPROATE SODIUM 100 MG/ML IV SOLN
500.0000 mg | Freq: Two times a day (BID) | INTRAVENOUS | Status: DC
  Administered 2022-02-16 – 2022-02-17 (×2): 500 mg via INTRAVENOUS
  Filled 2022-02-16 (×6): qty 5

## 2022-02-16 MED ORDER — FENTANYL CITRATE PF 50 MCG/ML IJ SOSY: 25.0000 ug | PREFILLED_SYRINGE | INTRAMUSCULAR | Status: DC | PRN

## 2022-02-16 MED ORDER — MIDAZOLAM HCL 2 MG/2ML IJ SOLN: 1.0000 mg | INTRAMUSCULAR | Status: DC | PRN

## 2022-02-16 MED ORDER — VALPROATE SODIUM 100 MG/ML IV SOLN
1000.0000 mg | Freq: Once | INTRAVENOUS | Status: AC
  Administered 2022-02-16: 1000 mg via INTRAVENOUS
  Filled 2022-02-16 (×2): qty 10

## 2022-02-16 NOTE — Progress Notes (Signed)
Physical Therapy Treatment ?Patient Details ?Name: Tamara Russell ?MRN: [id number] ?DOB: 1961-05-25 ?Today's Date: 02/16/2022 ? ? ?History of Present Illness 61 y.o. female who presents after being found altered this morning sitting on the porch. Patient had just recently been hospitalized from 4/19-4/23 after presenting for altered mental status. MRI negative; Psychiatry consult due to recent home stressors and pt with hallucinations; +delirium  PMH significant of hypertension, reformed smoker, recently hypoxic and started on home O2, and obesity ? ?  ?PT Comments  ? ? Patient lethargic but able to be aroused. Daughter present and able to convince pt to work with therapies. During session, if pt began to get agitated, daughter able to calm her and resume therapy. Able to stand at EOB x 3 reps with 2 person assist. Not able to progress to ambulation.  ?   ?Recommendations for follow up therapy are one component of a multi-disciplinary discharge planning process, led by the attending physician.  Recommendations may be updated based on patient status, additional functional criteria and insurance authorization. ? ?Follow Up Recommendations ? Skilled nursing-short term rehab (<3 hours/day) ?  ?  ?Assistance Recommended at Discharge Frequent or constant Supervision/Assistance  ?Patient can return home with the following Assistance with cooking/housework;A lot of help with walking and/or transfers;Direct supervision/assist for medications management;Direct supervision/assist for financial management;Help with stairs or ramp for entrance;Assist for transportation ?  ?Equipment Recommendations ? None recommended by PT  ?  ?Recommendations for Other Services   ? ? ?  ?Precautions / Restrictions Precautions ?Precautions: Fall;Other (comment) ?Precaution Comments: watch O2 sats ?Restrictions ?Weight Bearing Restrictions: No  ?  ? ?Mobility ? Bed Mobility ?Overal bed mobility: Needs Assistance ?Bed Mobility: Supine to  Sit ?Rolling: Max assist ?  ?Supine to sit: Max assist, +2 for physical assistance ?  ?  ?General bed mobility comments: required max assist x2 due to assistance needed with trunk and BLEs. Bed pads used to assist with scooting to EOB ?  ? ?Transfers ?Overall transfer level: Needs assistance ?Equipment used: 2 person hand held assist ?Transfers: Sit to/from Stand ?Sit to Stand: Mod assist, Max assist, +2 physical assistance ?  ?  ?  ?  ?  ?General transfer comment: max assist +2 for first 2 stands and mod assist +2 3rd stand ?  ? ?Ambulation/Gait ?  ?  ?  ?  ?  ?  ?  ?General Gait Details: attempted side stepping with pt able to slide each foot x 1 step only and returned to sitting ? ? ?Stairs ?  ?  ?  ?  ?  ? ? ?Wheelchair Mobility ?  ? ?Modified Rankin (Stroke Patients Only) ?  ? ? ?  ?Balance Overall balance assessment: Needs assistance ?Sitting-balance support: Bilateral upper extremity supported, Feet supported ?Sitting balance-Leahy Scale: Poor ?Sitting balance - Comments: min assist initially and supervision to min guard during session ?  ?Standing balance support: Bilateral upper extremity supported ?Standing balance-Leahy Scale: Poor ?Standing balance comment: bil UE support for standing ?  ?  ?  ?  ?  ?  ?  ?  ?  ?  ?  ?  ? ?  ?Cognition Arousal/Alertness: Awake/alert, Lethargic ?Behavior During Therapy: Agitated (fluxuated from agitated to cooperative) ?Overall Cognitive Status: Impaired/Different from baseline ?Area of Impairment: Safety/judgement, Awareness, Following commands, Attention ?  ?  ?  ?  ?  ?  ?  ?  ?  ?Current Attention Level: Sustained, Focused ?  ?Following Commands: Follows one step  commands inconsistently ?Safety/Judgement: Decreased awareness of deficits, Decreased awareness of safety ?Awareness: Emergent ?Problem Solving: Slow processing, Requires verbal cues, Difficulty sequencing, Requires tactile cues ?General Comments: Patient more agreeable to therapy on this date.  Bouts of  agitation during treatment but able to redirect ?  ?  ? ?  ?Exercises   ? ?  ?General Comments General comments (skin integrity, edema, etc.): Daughter present and able to convince pt to remain calm and participate with therapies. Patient very lethargic and closing eyes when not stimulated. At times would begin to become anxious/agitated, but daughter able to calm her and redirect her. ?  ?  ? ?Pertinent Vitals/Pain Pain Assessment ?Pain Assessment: No/denies pain ?Faces Pain Scale: No hurt  ? ? ?Home Living   ?  ?  ?  ?  ?  ?  ?  ?  ?  ?   ?  ?Prior Function    ?  ?  ?   ? ?PT Goals (current goals can now be found in the care plan section) Acute Rehab PT Goals ?Patient Stated Goal: unable to state ?PT Goal Formulation: With family ?Time For Goal Achievement: 02/25/22 ?Potential to Achieve Goals: Good ?Progress towards PT goals: Progressing toward goals ? ?  ?Frequency ? ? ? Min 3X/week (may go home) ? ? ? ?  ?PT Plan Current plan remains appropriate  ? ? ?Co-evaluation PT/OT/SLP Co-Evaluation/Treatment: Yes ?Reason for Co-Treatment: Necessary to address cognition/behavior during functional activity;For patient/therapist safety;To address functional/ADL transfers ?PT goals addressed during session: Mobility/safety with mobility;Balance ?OT goals addressed during session: ADL's and self-care ?  ? ?  ?AM-PAC PT "6 Clicks" Mobility   ?Outcome Measure ? Help needed turning from your back to your side while in a flat bed without using bedrails?: A Little ?Help needed moving from lying on your back to sitting on the side of a flat bed without using bedrails?: A Little ?Help needed moving to and from a bed to a chair (including a wheelchair)?: Total ?Help needed standing up from a chair using your arms (e.g., wheelchair or bedside chair)?: Total ?Help needed to walk in hospital room?: Total ?Help needed climbing 3-5 steps with a railing? : Total ?6 Click Score: 10 ? ?  ?End of Session Equipment Utilized During Treatment:  Oxygen ?Activity Tolerance: Patient limited by fatigue ?Patient left: with call bell/phone within reach;with family/visitor present;in bed;with bed alarm set ?Nurse Communication: Mobility status ?PT Visit Diagnosis: Other abnormalities of gait and mobility (R26.89);Muscle weakness (generalized) (M62.81) ?  ? ? ?Time: 2549-8264 ?PT Time Calculation (min) (ACUTE ONLY): 27 min ? ?Charges:  $Therapeutic Activity: 8-22 mins          ?          ? ? ?Jerolyn Center, PT ?Acute Rehabilitation Services  ?Pager [phone] ?Office [phone] ? ? ? ?Tamara Russell ?02/16/2022, 12:16 PM ? ?

## 2022-02-16 NOTE — Progress Notes (Addendum)
Subjective: ?Patient resting in bed with her daughter at bedside. Her daughter states that she slept relatively well overnight and appears to be eating well. She continues to display paranoid behavior, particularly when she is interacting with unfamiliar people. She continues to states that she doesn't want anyone to touch her bed and believes that any one who gets close may harm her. She can be redirected by her daughter on occasions. She has poor insight into her current state.  ? ?Exam: ?Vitals:  ? 02/15/22 2344 02/16/22 0300  ?BP: (!) 90/56 99/65  ?Pulse: 81 83  ?Resp: 20 20  ?Temp: 98 ?F (36.7 ?C) 98.1 ?F (36.7 ?C)  ?SpO2: 94% 94%  ? ?Gen: In bed, NAD ?Resp: non-labored breathing, no acute distress ?Abd: soft, nt ? ?Neuro: ?MS: alert and orient to person, place, and time, poor insight  ?CN: grossly intact ?Motor: grossly normal strength with upper extremity tremor present ?Sensory:intact ?DTR: unable to perform  performed  ? ?Pertinent Labs: ? ?  Latest Ref Rng & Units 02/13/2022  ?  9:30 AM 02/09/2022  ? 12:33 AM 02/08/2022  ?  1:07 AM  ?CBC  ?WBC 4.0 - 10.5 K/uL 6.0   4.5   4.2    ?Hemoglobin 12.0 - 15.0 g/dL 41.9   62.2   29.7    ?Hematocrit 36.0 - 46.0 % 51.3   46.9   44.6    ?Platelets 150 - 400 K/uL 283   259   239    ? ? ?  Latest Ref Rng & Units 02/16/2022  ?  1:09 AM 02/14/2022  ? 12:50 AM 02/13/2022  ?  9:30 AM  ?CMP  ?Glucose 70 - 99 mg/dL 989   97   211    ?BUN 8 - 23 mg/dL 18   18   17     ?Creatinine 0.44 - 1.00 mg/dL   9.41   7.40    ?Sodium 135 - 145 mmol/L 143   146   140    ?Potassium 3.5 - 5.1 mmol/L 3.8   3.7   4.7    ?Chloride 98 - 111 mmol/L 94   96   91    ?CO2 22 - 32 mmol/L 42   41   37    ?Calcium 8.9 - 10.3 mg/dL 9.7   9.9   8.14    ?Total Protein 6.5 - 8.1 g/dL   7.3    ?Total Bilirubin 0.3 - 1.2 mg/dL   0.5    ?Alkaline Phos 38 - 126 U/L   51    ?AST 15 - 41 U/L   28    ?ALT 0 - 44 U/L   22    ? ? ? ?Impression:  ? ?Acute Encephalopathy ?Discussed with psychiatry that the patient would  benefit from a trial of Depakote prior to further invasive testing.  ? ?Recommendations: ?1) Cont delirium precautions. May benefit form less line and tethers ?2) Cont dietary supplements ?3) Trial of Depakote  ? ?48.1, D.O.  ?Internal Medicine Resident, PGY-3 ?[facility]  ?8:26 AM, 02/16/2022  ? ?Begin attending attestation: ?She continues to display paranoid behaviors. ? ?She continues to display altered behavior of unclear etiology.  Given how bizarre of the presentation was and the fact that it is very unusual for someone her age to develop this severe delirium/psychosis at this time, I do feel it is prudent to pursue further testing with autoimmune panel as well as CSF sampling.  I would favor going ahead and starting Depakote.  If there is significant abnormality, would consider IV steroids in addition, otherwise would favor giving Depakote a trial first and only pursuing other therapy if this did not help. ? ?Neurology will continue to follow. ? ?Ritta Slot, MD ?[facility] ?[phone] ? ?If 7pm- 7am, please page neurology on call as listed in AMION. ? ? ?

## 2022-02-16 NOTE — Consult Note (Signed)
[facility] Face-to-Face Psychiatry Consult  ? ?Reason for Consult:  AMS and psychosis ?Referring Physician:  Dr. Sloan Leiter ?Patient Identification: Tamara Russell ?MRN:  [id number] ?Principal Diagnosis: Acute metabolic encephalopathy ?Diagnosis:  Principal Problem: ?  Acute metabolic encephalopathy ?Active Problems: ?  Acute respiratory failure with hypoxia and hypercapnia (HCC) ?  Hypertension ?  PUD (peptic ulcer disease) ?  Elevated troponin ?  History of UTI ?  Hypokalemia ?  Class 3 severe obesity due to excess calories with serious comorbidity and body mass index (BMI) of 40.0 to 44.9 in adult [facility]) ?  Microcytosis ?  Diastolic dysfunction ? ? ?Total Time spent with patient: 30 minutes ? ?Subjective:   ?Tamara Russell is a 61 y.o. female patient  Admitted on 02/06/2022 for altered mental status and psychosis. Psychiatry consulted for AMS and psychosis.  ?Patient seen and chart reviewed- has refused multiple doses of meds. Did get olanzpaine 5 mg this AM without apparent effect. Qtc 459 on updated EKG.  ? ? ?Saw pt in AM today accompanied by NP Starkes-Perry. Overall pt with significant improvement. Remains fairly suspicious/paranoid (interview derailed any time someone touched or leaned on her bed) but was able to answer most questions appropriately as below. Oriented to self and generally to situation; thought it was Feb 14 2022. DOWB - made it about 1/2way through the week with 1-2 errors before trailing off. Recognizes daughter and husband at bedside. She does ask team to leave roughly every 30 seconds during interview but is easily distractible especially with aid of family at bedside. No hyper-religious content noted. Apparantly had a very good conversation with son-in-law this AM.  ? ?Does not allow either myself or NP Starkes-Perry to perform physical exam. Does attempt to follow commands to lift arms, shake head back and forth, etc.  Per family has been stiff but they feel it is due to not getting out of  bed - no evidence of any focal dystonia on family interview.  Performed capacity evaluation for ability to refuse lumbar puncture as below; generally pt does not have capacity due to poor understanding of need for procedure and inadequate rationality.  ? ?Decision being assessed: refuse lumbar puncture ? ?In an evaluation of capacity, each of the following criteria must be met based for a patient to have capacity to make the decision in question.  ? ?Criterion 1: The patient demonstrates a clear and consistent voluntary choice with regard to treatment options. Yes ? ?Criterion 2: The patient adequately understands the disease they have, the treatment proposed, the risks of treatment, and the risks of other treatment (including no treatment). No Not able to repeat the indication for LP  ? ?Criterion 3: The patient acknowledges that the details of Criterion 2 apply to them specifically and the likely consequences of treatment options proposed. No ? ?Criterion 4: The patient demonstrates adequate reasoning/rationality within the context of their decision and can provide justification for their choice. No States "I'll just go to my PCP to get it done" which is not reflective of procedure's urgency and likely not possible ? ?In this case, the patient lacks capacity to decide to refuse lumbar puncture.  ? ?See patient interview for details. Of note, this capacity evaluation assesses only for the specified decision documented above around the time of the assessment and is not a substitute for determination of the patient's overall competency, which can only be adjudicated. ? ? ? ?Collateral ?Asked daughter at bedside to stay for PT which was helpful  per their notes ? ?Spoke to Dr. Leonel Ramsay - they plan to perform LP tomorrow. Discussed differential; both services inclined to start depakote (some data in autoimmune encephalitis/agitation).  ? ?HPI :   ?Tamara Russell is a 61 y.o. female patient  Admitted on 02/06/2022  for altered mental status and psychosis. Psychiatry consulted for AMS and psychosis.  ? ? ? ?Past Medical History:  ?Past Medical History:  ?Diagnosis Date  ? Acid reflux   ? Complication of anesthesia   ? slow to wake up  ? History of COVID-19   ? Hypertension   ?  ?Past Surgical History:  ?Procedure Laterality Date  ? ABDOMINAL HYSTERECTOMY    ? BIOPSY  11/17/2018  ? Procedure: BIOPSY;  Surgeon: Jerene Bears, MD;  Location: [facility] ENDOSCOPY;  Service: Gastroenterology;;  ? BREAST EXCISIONAL BIOPSY Left   ? benign  ? BREAST EXCISIONAL BIOPSY Right   ? benign  ? ESOPHAGOGASTRODUODENOSCOPY (EGD) WITH PROPOFOL N/A 11/17/2018  ? Procedure: ESOPHAGOGASTRODUODENOSCOPY (EGD) WITH PROPOFOL;  Surgeon: Jerene Bears, MD;  Location: [facility] ENDOSCOPY;  Service: Gastroenterology;  Laterality: N/A;  ? ?Family History:  ?Family History  ?Problem Relation Age of Onset  ? Cancer Father 72  ?     stomach  ? Hypertension Father   ? Diabetes Maternal Grandmother   ? Colon cancer Maternal Grandfather   ? Diabetes Paternal Grandmother   ? Colon cancer Paternal Grandmother   ? ?Family history ?No family psychiatric history disclosed.  ?The patient's family history includes Cancer (age of onset: 71) in her father; Colon cancer in her maternal grandfather and paternal grandmother; Diabetes in her maternal grandmother and paternal grandmother; Hypertension in her father. ?  ? ? ?Social History:  ?Social History  ? ?Substance and Sexual Activity  ?Alcohol Use No  ? Alcohol/week: 0.0 standard drinks  ?   ?Social History  ? ?Substance and Sexual Activity  ?Drug Use No  ?  ?Social History  ? ?Socioeconomic History  ? Marital status: Married  ?  Spouse name: Not on file  ? Number of children: Not on file  ? Years of education: Not on file  ? Highest education level: Not on file  ?Occupational History  ? Occupation: unemployed  ?Tobacco Use  ? Smoking status: Former  ?  Packs/day: 0.50  ?  Years: 20.00  ?  Pack years: 10.00  ?  Types: Cigarettes  ?  Start  date: 04/30/1975  ?  Quit date: 04/30/1995  ?  Years since quitting: 26.8  ? Smokeless tobacco: Never  ?Vaping Use  ? Vaping Use: Never used  ?Substance and Sexual Activity  ? Alcohol use: No  ?  Alcohol/week: 0.0 standard drinks  ? Drug use: No  ? Sexual activity: Not on file  ?Other Topics Concern  ? Not on file  ?Social History Narrative  ? Not on file  ? ?Social Determinants of Health  ? ?Financial Resource Strain: Not on file  ?Food Insecurity: Not on file  ?Transportation Needs: Not on file  ?Physical Activity: Not on file  ?Stress: Not on file  ?Social Connections: Not on file  ? ?Additional Social History: ?  ? ?Allergies:   ?Allergies  ?Allergen Reactions  ? Penicillins Other (See Comments)  ?  Childhood allergy. Pt does not remember reaction  ? ? ?Labs:  ?Results for orders placed or performed during the hospital encounter of 02/06/22 (from the past 48 hour(s))  ?Basic metabolic panel     Status: Abnormal  ?  Collection Time: 02/16/22  1:09 AM  ?Result Value Ref Range  ? Sodium 143 135 - 145 mmol/L  ? Potassium 3.8 3.5 - 5.1 mmol/L  ? Chloride 94 (L) 98 - 111 mmol/L  ? CO2 42 (H) 22 - 32 mmol/L  ? Glucose, Bld 107 (H) 70 - 99 mg/dL  ?  Comment: Glucose reference range applies only to samples taken after fasting for at least 8 hours.  ? BUN 18 8 - 23 mg/dL  ? Creatinine, Ser 0.60 0.44 - 1.00 mg/dL  ? Calcium 9.7 8.9 - 10.3 mg/dL  ? GFR, Estimated >60 >60 mL/min  ?  Comment: (NOTE) ?Calculated using the CKD-EPI Creatinine Equation (2021) ?  ? Anion gap 7 5 - 15  ?  Comment: Performed at [facility], [place] [address] [place], [place] [address]  ? ? ?Current Facility-Administered Medications  ?Medication Dose Route Frequency Provider Last Rate Last Admin  ? acetaminophen (TYLENOL) tablet 650 mg  650 mg Oral Q6H PRN Norval Morton, MD      ? Or  ? acetaminophen (TYLENOL) suppository 650 mg  650 mg Rectal Q6H PRN Fuller Plan A, MD      ? albuterol (PROVENTIL) (2.5 MG/3ML) 0.083% nebulizer solution 2.5 mg   2.5 mg Nebulization Q6H PRN Fuller Plan A, MD      ? aspirin EC tablet 81 mg  81 mg Oral Daily Smith, Rondell A, MD   81 mg at 02/10/22 1331  ? atorvastatin (LIPITOR) tablet 40 mg  40 mg Oral QH

## 2022-02-16 NOTE — Consult Note (Signed)
? ?Chief Complaint: ?Patient was seen in consultation today for  ?Chief Complaint  ?Patient presents with  ? Altered Mental Status  ? ? ?Referring Physician(s): Dr. Sloan Leiter ? ?Supervising Physician: Arne Cleveland ? ?Patient Status: [facility] - In-pt ? ?History of Present Illness: ?Tamara Russell is a 61 y.o. female with a past medical history significant for obesity and HTN who presented to the [facility] ED 02/02/22 with shortness of breath, mild confusion, generalized weakness and nausea. Her troponin levels were slightly elevated and she was admitted for further work up. Additional work up revealed a UTI, potassium and sodium abnormalities and OSA/OHS. She was treated appropriately and discharged home 02/05/22 with oral antibiotics and an outpatient sleep study.  ? ?She returned to the ED 02/06/22 via EMS with encephalopathy. Work up was mostly unrevealing with no clear cause of the encephalopathy - she was mildly hypoxic, alcohol level was moderately elevated, electrolytes were slightly off.  Her UA was also positive for UTI. She was admitted for further evaluation and continued to experience worsening encephalopathy and psychosis. She has continued to demonstrate agitation, paranoia, delusions, visual/auditory hallucinations and a refusal to work with medical staff. She was evaluated by psychiatry and diagnosed with prolonged acute delirium in the setting of a recent UTI.  ? ?She has been medically managed and numerous psychiatric medications have been prescribed without resolution of her altered mental status. Interventional Radiology has been asked to evaluate this patient for an image-guided lumbar puncture under moderate sedation. This case has been reviewed and procedure approved by Dr. Vernard Gambles.   ? ?Past Medical History:  ?Diagnosis Date  ? Acid reflux   ? Complication of anesthesia   ? slow to wake up  ? History of COVID-19   ? Hypertension   ? ? ?Past Surgical History:  ?Procedure Laterality Date  ?  ABDOMINAL HYSTERECTOMY    ? BIOPSY  11/17/2018  ? Procedure: BIOPSY;  Surgeon: Jerene Bears, MD;  Location: [facility] ENDOSCOPY;  Service: Gastroenterology;;  ? BREAST EXCISIONAL BIOPSY Left   ? benign  ? BREAST EXCISIONAL BIOPSY Right   ? benign  ? ESOPHAGOGASTRODUODENOSCOPY (EGD) WITH PROPOFOL N/A 11/17/2018  ? Procedure: ESOPHAGOGASTRODUODENOSCOPY (EGD) WITH PROPOFOL;  Surgeon: Jerene Bears, MD;  Location: [facility] ENDOSCOPY;  Service: Gastroenterology;  Laterality: N/A;  ? ? ?Allergies: ?Penicillins ? ?Medications: ?Prior to Admission medications   ?Medication Sig Start Date End Date Taking? Authorizing Provider  ?acetaminophen (TYLENOL) 500 MG tablet Take 1 tablet (500 mg total) by mouth every 6 (six) hours as needed for moderate pain. Reported on 04/21/2016 02/05/22  Yes Bonnell Public, MD  ?aspirin EC 81 MG tablet Take 81 mg by mouth daily as needed ("overworking heart").   Yes [provider]  ?Multiple Vitamins-Minerals (CENTRUM SILVER ADULT 50+ PO) Take 1 tablet by mouth daily.   Yes [provider]  ?atorvastatin (LIPITOR) 40 MG tablet Take 1 tablet (40 mg total) by mouth daily. ?Patient not taking: Reported on 02/06/2022 02/06/22 03/08/22  Dana Allan I, MD  ?metoprolol succinate (TOPROL-XL) 25 MG 24 hr tablet Take 0.5 tablets (12.5 mg total) by mouth every morning for 30 doses. ?Patient not taking: Reported on 02/06/2022 02/06/22 03/08/22  Rex Kras, DO  ?spironolactone (ALDACTONE) 25 MG tablet Take 1 tablet (25 mg total) by mouth daily. ?Patient not taking: Reported on 02/06/2022 02/05/22 02/05/23  Bonnell Public, MD  ?  ? ?Family History  ?Problem Relation Age of Onset  ? Cancer Father 35  ?  stomach  ? Hypertension Father   ? Diabetes Maternal Grandmother   ? Colon cancer Maternal Grandfather   ? Diabetes Paternal Grandmother   ? Colon cancer Paternal Grandmother   ? ? ?Social History  ? ?Socioeconomic History  ? Marital status: Married  ?  Spouse name: Not on file  ? Number of children:  Not on file  ? Years of education: Not on file  ? Highest education level: Not on file  ?Occupational History  ? Occupation: unemployed  ?Tobacco Use  ? Smoking status: Former  ?  Packs/day: 0.50  ?  Years: 20.00  ?  Pack years: 10.00  ?  Types: Cigarettes  ?  Start date: 04/30/1975  ?  Quit date: 04/30/1995  ?  Years since quitting: 26.8  ? Smokeless tobacco: Never  ?Vaping Use  ? Vaping Use: Never used  ?Substance and Sexual Activity  ? Alcohol use: No  ?  Alcohol/week: 0.0 standard drinks  ? Drug use: No  ? Sexual activity: Not on file  ?Other Topics Concern  ? Not on file  ?Social History Narrative  ? Not on file  ? ?Social Determinants of Health  ? ?Financial Resource Strain: Not on file  ?Food Insecurity: Not on file  ?Transportation Needs: Not on file  ?Physical Activity: Not on file  ?Stress: Not on file  ?Social Connections: Not on file  ? ? ?Review of Systems: A 12 point ROS discussed and pertinent positives are indicated in the HPI above.  All other systems are negative. ? ?Review of Systems  ?Unable to perform ROS: Mental status change  ? ?Vital Signs: ?BP 125/70 (BP Location: Right Arm)   Pulse 88   Temp 99.3 ?F (37.4 ?C) (Oral)   Resp 20   SpO2 93%  ? ?Physical Exam ?Constitutional:   ?   General: She is not in acute distress. ?   Appearance: She is not ill-appearing.  ?   Comments: Patient asleep. She remained asleep throughout my exam. I didn't try to wake her up.   ?HENT:  ?   Mouth/Throat:  ?   Mouth: Mucous membranes are moist.  ?   Pharynx: Oropharynx is clear.  ?Cardiovascular:  ?   Rate and Rhythm: Normal rate and regular rhythm.  ?   Pulses: Normal pulses.  ?   Heart sounds: Normal heart sounds.  ?Pulmonary:  ?   Effort: Pulmonary effort is normal.  ?   Breath sounds: Normal breath sounds.  ?Abdominal:  ?   General: Bowel sounds are normal.  ?   Palpations: Abdomen is soft.  ?Musculoskeletal:  ?   Right lower leg: No edema.  ?   Left lower leg: No edema.  ?Skin: ?   General: Skin is warm and  dry.  ?Neurological:  ?   Comments: Not assessed. Patient asleep.   ? ? ?Imaging: ?DG Chest 2 View ? ?Result Date: 02/06/2022 ?CLINICAL DATA:  Shortness of breath EXAM: CHEST - 2 VIEW COMPARISON:  Radiograph 02/03/2022 FINDINGS: Unchanged enlarged cardiac silhouette. Low lung volumes with bibasilar subsegmental atelectasis. Mild interstitial opacities. No large pleural effusion. No visible pneumothorax. No acute osseous abnormality. IMPRESSION: Unchanged cardiomegaly. Mild interstitial opacities could represent interstitial edema. Low lung volumes with bibasilar subsegmental atelectasis. Electronically Signed   By: Maurine Simmering M.D.   On: 02/06/2022 08:31  ? ?DG Chest 2 View ? ?Result Date: 02/02/2022 ?CLINICAL DATA:  Shortness of breath. EXAM: CHEST - 2 VIEW COMPARISON:  Chest x-ray 12/03/2019. FINDINGS:  The heart size and mediastinal contours are within normal limits. Both lungs are clear. The visualized skeletal structures are unremarkable. IMPRESSION: No active cardiopulmonary disease. Electronically Signed   By: Ronney Asters M.D.   On: 02/02/2022 00:21  ? ?CT Head Wo Contrast ? ?Result Date: 02/06/2022 ?CLINICAL DATA:  Mental status change, unknown cause EXAM: CT HEAD WITHOUT CONTRAST TECHNIQUE: Contiguous axial images were obtained from the base of the skull through the vertex without intravenous contrast. RADIATION DOSE REDUCTION: This exam was performed according to the departmental dose-optimization program which includes automated exposure control, adjustment of the mA and/or kV according to patient size and/or use of iterative reconstruction technique. COMPARISON:  02/02/2022 head CT. FINDINGS: Brain: Nonspecific mild subcortical and periventricular white matter hypodensity, most in keeping with chronic small vessel ischemic change. No evidence of parenchymal hemorrhage or extra-axial fluid collection. No mass lesion, mass effect, or midline shift. No CT evidence of acute infarction. Cerebral volume is age  appropriate. No ventriculomegaly. Vascular: No acute abnormality. Skull: No evidence of calvarial fracture. Sinuses/Orbits: The visualized paranasal sinuses are essentially clear. Other:  The mastoid air

## 2022-02-16 NOTE — Progress Notes (Signed)
Occupational Therapy Treatment ?Patient Details ?Name: Tamara Russell ?MRN: [id number] ?DOB: 1960-11-27 ?Today's Date: 02/16/2022 ? ? ?History of present illness 61 y.o. female who presents after being found altered this morning sitting on the porch. Patient had just recently been hospitalized from 4/19-4/23 after presenting for altered mental status. MRI negative; Psychiatry consult due to recent home stressors and pt with hallucinations; +delirium  PMH significant of hypertension, reformed smoker, recently hypoxic and started on home O2, and obesity ?  ?OT comments ? Patient received in supine and was agreeable to PT/OT session with daughters encouragement. Patient required increased time and max assist +2 to get to EOB. Patient was min assist initially for sitting balance but progressed to supervision to min guard assist. Patient performed 3 stands from EOB with 2 person hand held assist with max assist +2 for first 2 attempts and mod assist +2 for 3rd attempt. Patient asked to remain sitting on EOB for daughter to assist with bathing. Nursing notified. Acute OT to continue to follow.   ? ?Recommendations for follow up therapy are one component of a multi-disciplinary discharge planning process, led by the attending physician.  Recommendations may be updated based on patient status, additional functional criteria and insurance authorization. ?   ?Follow Up Recommendations ? Skilled nursing-short term rehab (<3 hours/day)  ?  ?Assistance Recommended at Discharge Frequent or constant Supervision/Assistance  ?Patient can return home with the following ? Two people to help with walking and/or transfers;A little help with walking and/or transfers;A lot of help with bathing/dressing/bathroom;Direct supervision/assist for financial management;Direct supervision/assist for medications management ?  ?Equipment Recommendations ? Tub/shower seat  ?  ?Recommendations for Other Services   ? ?  ?Precautions / Restrictions  Precautions ?Precautions: Fall;Other (comment) ?Precaution Comments: watch O2 sats ?Restrictions ?Weight Bearing Restrictions: No  ? ? ?  ? ?Mobility Bed Mobility ?Overal bed mobility: Needs Assistance ?Bed Mobility: Supine to Sit ?  ?  ?Supine to sit: Max assist, +2 for physical assistance ?  ?  ?General bed mobility comments: required max assist x2 due to assistance needed with trunk and BLEs. Bed pads used to assist with scooting to EOB ?  ? ?Transfers ?Overall transfer level: Needs assistance ?Equipment used: 2 person hand held assist ?Transfers: Sit to/from Stand ?Sit to Stand: Mod assist, Max assist, +2 physical assistance ?  ?  ?  ?  ?  ?General transfer comment: max assist +2 for first 2 stands and mod assist +2 3rd stand ?  ?  ?Balance Overall balance assessment: Needs assistance ?Sitting-balance support: Bilateral upper extremity supported, Feet supported ?Sitting balance-Leahy Scale: Poor ?Sitting balance - Comments: min assist initially and supervision to min guard during session ?  ?Standing balance support: Bilateral upper extremity supported ?Standing balance-Leahy Scale: Poor ?Standing balance comment: bil UE support for standing ?  ?  ?  ?  ?  ?  ?  ?  ?  ?  ?  ?   ? ?ADL either performed or assessed with clinical judgement  ? ?ADL Overall ADL's : Needs assistance/impaired ?  ?  ?Grooming: Wash/dry face;Minimal assistance;Sitting ?Grooming Details (indicate cue type and reason): performed seated on EOB with difficulty washing forehead ?  ?  ?  ?  ?  ?  ?  ?  ?  ?  ?  ?  ?  ?  ?  ?General ADL Comments: focused on sitting on EOB and sit to stands ?  ? ?Extremity/Trunk Assessment   ?  ?  ?  ?  ?  ? ?  Vision   ?  ?  ?Perception   ?  ?Praxis   ?  ? ?Cognition Arousal/Alertness: Awake/alert, Lethargic ?Behavior During Therapy: Agitated (fluxuated from agitated to cooperative) ?Overall Cognitive Status: Impaired/Different from baseline ?Area of Impairment: Safety/judgement, Awareness, Following commands,  Attention ?  ?  ?  ?  ?  ?  ?  ?  ?  ?Current Attention Level: Sustained, Focused ?  ?Following Commands: Follows one step commands consistently ?Safety/Judgement: Decreased awareness of deficits, Decreased awareness of safety ?Awareness: Emergent ?Problem Solving: Slow processing, Requires verbal cues, Difficulty sequencing, Requires tactile cues ?General Comments: Patient more agreeable to therapy on this date.  Bouts of agitation during treatment but able to redirect ?  ?  ?   ?Exercises   ? ?  ?Shoulder Instructions   ? ? ?  ?General Comments moments of agitation but was able to redirect  ? ? ?Pertinent Vitals/ Pain       Pain Assessment ?Pain Assessment: No/denies pain ?Faces Pain Scale: No hurt ? ?Home Living   ?  ?  ?  ?  ?  ?  ?  ?  ?  ?  ?  ?  ?  ?  ?  ?  ?  ?  ? ?  ?Prior Functioning/Environment    ?  ?  ?  ?   ? ?Frequency ? Min 2X/week  ? ? ? ? ?  ?Progress Toward Goals ? ?OT Goals(current goals can now be found in the care plan section) ? Progress towards OT goals: Progressing toward goals ? ?Acute Rehab OT Goals ?OT Goal Formulation: Patient unable to participate in goal setting ?Time For Goal Achievement: 02/25/22 ?Potential to Achieve Goals: Good ?ADL Goals ?Pt Will Perform Grooming: with supervision;standing ?Pt Will Perform Lower Body Dressing: with supervision;sit to/from stand ?Pt Will Transfer to Toilet: with supervision;ambulating ?Pt Will Perform Toileting - Clothing Manipulation and hygiene: with supervision;sitting/lateral leans;sit to/from stand ?Additional ADL Goal #1: Pt will demonstrate improved mentation by scoring <4/10 on short blessed test and answering 4/4 safety questions from the [facility] correctly: 1. What do you do for yourself if you are sick with a cold. 2. What do you do if you burn yourself and the wound becomes infected. 3. What do you do if you experience severe chest pain and shortness of breath? 4. What number do you call in an emergency? ?Additional ADL Goal #2: Pt will  demonstrate improved cognition by consistently following 2 step instructions, and initiating ADLs when needed without cues.  ?Plan Discharge plan remains appropriate   ? ?Co-evaluation ? ? ? PT/OT/SLP Co-Evaluation/Treatment: Yes ?Reason for Co-Treatment: Necessary to address cognition/behavior during functional activity;For patient/therapist safety;To address functional/ADL transfers ?  ?OT goals addressed during session: ADL's and self-care ?  ? ?  ?AM-PAC OT "6 Clicks" Daily Activity     ?Outcome Measure ? ? Help from another person eating meals?: A Little ?Help from another person taking care of personal grooming?: A Little ?Help from another person toileting, which includes using toliet, bedpan, or urinal?: A Lot ?Help from another person bathing (including washing, rinsing, drying)?: A Lot ?Help from another person to put on and taking off regular upper body clothing?: A Little ?Help from another person to put on and taking off regular lower body clothing?: A Lot ?6 Click Score: 15 ? ?  ?End of Session Equipment Utilized During Treatment: Oxygen;Gait belt ? ?OT Visit Diagnosis: Unsteadiness on feet (R26.81);Other symptoms and signs involving cognitive function;Dizziness and  giddiness (R42) ?  ?Activity Tolerance Patient tolerated treatment well ?  ?Patient Left in bed;with family/visitor present (seated on EOB with family) ?  ?Nurse Communication Other (comment);Mobility status (made nursing aware patient remained sitting on EOB with family) ?  ? ?   ? ?Time: [id number] ?OT Time Calculation (min): 27 min ? ?Charges: OT General Charges ?$OT Visit: 1 Visit ?OT Treatments ?$Self Care/Home Management : 8-22 mins ? ?Alfonse Flavorsick Reene Harlacher, OTA ?Acute Rehabilitation Services  ?Pager [phone] ?Office [phone] ? ? ?Jawana Reagor Jeannett SeniorL Jaryd Drew ?02/16/2022, 11:35 AM ?

## 2022-02-16 NOTE — Progress Notes (Addendum)
?      ?                 PROGRESS NOTE ? ?      ?PATIENT DETAILS ?Name: Tamara Russell ?Age: 61 y.o. ?Sex: female ?Date of Birth: April 23, 1961 ?Admit Date: 02/06/2022 ?Admitting Physician Clydie Braunondell A Smith, MD ?ZOX:WRUEAPCP:Bland, Adrian SaranVeita, MD ? ?Brief Summary: ?Patient is a 61 y.o.  female morbid obesity, HTN, GERD-who was just discharged from this facility on 4/23-presented to the hospital on 4/24 with altered mental status. ? ?Significant events: ?4/19-4/23>> hospitalization for chest pain, confusion-UTI.  Per notes mental status stable at discharge. ?4/24> [facility] to [facility] ED for confusion[facility] ?4/28>>Neuro eval-marital stress possibly causing AMS-psych consult recommended ?5/01>> episode of aspiration-bradycardia/worsening hypoxemia-improved after suctioning.CXR with bibasilar opacities-IV Ancef started.  BiPAP held ?5/04>>Neuro re-eval per psych rec's-?autoimmune process causing AMS ?5/05>> trial of Depakote per neurology.  LP planned under sedation on 5/6 ? ?Significant studies: ?4/20>> HIV: Negative ?4/20>> TSH: Not elevated ?4/24>> CT head: No acute intracranial abnormality ?4/24>> CXR: No PNA ?4/24>> RPR: Nonreactive ?4/24>> NH4: Normal ?4/24>> alcohol level: 65 ?4/26>> EEG: No seizures ?4/26>> vitamin B12: 2964 ?4/26>> vitamin B1: Normal ?4/27>> acute hepatitis serology: Negative ?4/27>>MRI brain: no acute abnormalities ?4/29>> lead levels:Normal ?5/01>>CXR:bibasilar opacities ? ?Significant microbiology data: ?4/20>> urine culture: E. coli/Citrobacter ?4/24>> COVID/influenza PCR: Negative ?4/24>> urine culture: Enterococcus faecalis ?4/24>> blood culture: Negative ? ?Procedures: ?None ? ?Consults: ?Neurology, psychiatry ? ?Subjective: ?Somewhat sleepy but still very paranoid and suspicious.  Does not want me touching her. ? ?Objective: ?Vitals: ?Blood pressure 121/76, pulse 93, temperature 98.5 ?F (36.9 ?C), temperature source Oral, resp. rate 20, SpO2 94 %.  ? ?Exam: ?Gen Exam:not in any  distress ?HEENT:atraumatic, normocephalic ?Chest: B/L clear to auscultation anteriorly ?CVS:S1S2 regular ?Abdomen:soft non tender, non distended ?Extremities:no edema ?Neurology: Non focal ?Skin: no rash  ? ?Pertinent Labs/Radiology: ? ?  Latest Ref Rng & Units 02/13/2022  ?  9:30 AM 02/09/2022  ? 12:33 AM 02/08/2022  ?  1:07 AM  ?CBC  ?WBC 4.0 - 10.5 K/uL 6.0   4.5   4.2    ?Hemoglobin 12.0 - 15.0 g/dL 54.015.3   98.113.8   19.113.6    ?Hematocrit 36.0 - 46.0 % 51.3   46.9   44.6    ?Platelets 150 - 400 K/uL 283   259   239    ?  ?Lab Results  ?Component Value Date  ? NA 143 02/16/2022  ? K 3.8 02/16/2022  ? CL 94 (L) 02/16/2022  ? CO2 42 (H) 02/16/2022  ? ?  ? ? ?Assessment/Plan: ?Acute metabolic encephalopathy:Initially felt to be possibly related to Hypercarbia/Partially treated UTI-and possible ETOH use (family denies any ETOH hx). Unfortunated AMS still persisted inspite of treatment of above issues.  Work-up remains neg-possibly psychiatric etiology-given significant marital stressors at home. Remains essentially the same for the past several days-very paranoid and suspicious Psych and Neuro following closely. Remains on Haldol/Ativan scheduled dosing twice daily-neurology starting a trial of Depakote.  Long discussion with spouse/daughter at bedside by myself and Dr. Angelyn PuntKirkpatrick-we both feel it is prudent to rule out any autoimmune process by obtaining some CSF sampling.  Family explained that given patient's clinical situation-this will need to be under sedation-they understand risk of sedation-and agreeable to proceed.  Discussed with IR-this has been tentatively scheduled for 5/5. ? ?Asymptomatic bacteriuria: Suspect urine culture results more suggestive of asymptomatic bacteriuria rather than a UTI. Patient had 2 different organisms in urine culture  within a week in spite of treatment.  She was initially on Rocephin-plans were to start ampicillin but she has a documented allergy to PCN-hence after discussion with  pharmacy- was given her 1 dose of fosfomycin.   ? ?Possible aspiration PNA: Occurred on 5/1 night-while on BiPAP-briefly required 12 L of oxygen-was quickly suctioned with significant improvement.  CXR with either PNA or pulm edema.  Remains stable on Ancef-on 2 L of oxygen-SLP following-being started on a dysphagia 1 diet.   ? ?Chronic hypoxic respiratory failure: Per family-patient was discharged on home O2 during her most recent hospitalization.  Stable on 2 L of oxygen. ? ?OSA: On BiPAP-reasonable to hold due to aspiration episode.  Will use oxygen instead. ? ?Hyponatremia: Minimal/mild-doubt this is of any clinical significance. ? ?Chronic HFpEF: Volume status stable ? ?HTN: BP stable on metoprolol ? ?HLD: Statin ? ?Debility/deconditioning: Due to acute illness-recommendations from PT are for SNF. ? ?Morbid Obesity: ?Estimated body mass index is 40.74 kg/m? as calculated from the following: ?  Height as of 11/21/21: 5\' 1"  (1.549 m). ?  Weight as of 02/05/22: 97.8 kg.  ? ?Code status: ?  Code Status: Full Code  ? ?DVT Prophylaxis: Prophylactic Lovenox  ? ?Family Communication: Spouse and daughter at bedside. ? ? ?Disposition Plan: ?Status is: Inpatient ?Remains inpatient appropriate because: Continues to be confused-now aspirated overnight-on Unasyn.  Not yet stable for discharge. ?  ?Planned Discharge Destination: SNF-probably will require geriatric psychiatry placement. ? ? ?Diet: ?Diet Order   ? ?       ?  Diet NPO time specified  Diet effective midnight       ?  ?  DIET - DYS 1 Room service appropriate? Yes; Fluid consistency: Thin  Diet effective now       ?  ? ?  ?  ? ?  ?  ? ? ?Antimicrobial agents: ?Anti-infectives (From admission, onward)  ? ? Start     Dose/Rate Route Frequency Ordered Stop  ? 02/13/22 1400  ceFAZolin (ANCEF) IVPB 2g/100 mL premix       ? 2 g ?200 mL/hr over 30 Minutes Intravenous Every 8 hours 02/13/22 0951 02/18/22 1359  ? 02/08/22 1530  fosfomycin (MONUROL) packet 3 g       ? 3 g Oral   Once 02/08/22 1430 02/08/22 1548  ? 02/07/22 1500  cefTRIAXone (ROCEPHIN) 1 g in sodium chloride 0.9 % 100 mL IVPB  Status:  Discontinued       ? 1 g ?200 mL/hr over 30 Minutes Intravenous Every 24 hours 02/07/22 1344 02/08/22 1412  ? 02/07/22 1200  cefTRIAXone (ROCEPHIN) 1 g in sodium chloride 0.9 % 100 mL IVPB  Status:  Discontinued       ? 1 g ?200 mL/hr over 30 Minutes Intravenous Every 24 hours 02/06/22 1328 02/06/22 1729  ? 02/06/22 1800  cefTRIAXone (ROCEPHIN) 1 g in sodium chloride 0.9 % 100 mL IVPB       ? 1 g ?200 mL/hr over 30 Minutes Intravenous  Once 02/06/22 1729 02/06/22 1938  ? ?  ? ? ? ?MEDICATIONS: ?Scheduled Meds: ? aspirin EC  81 mg Oral Daily  ? atorvastatin  40 mg Oral QHS  ? haloperidol lactate  5 mg Intravenous BID  ? And  ? LORazepam  2 mg Intravenous BID  ? melatonin  3 mg Oral q1800  ? metoprolol succinate  12.5 mg Oral q morning  ? sodium chloride flush  3 mL Intravenous Q12H  ?  thiamine  100 mg Oral Daily  ? ?Continuous Infusions: ?  ceFAZolin (ANCEF) IV 2 g (02/16/22 1404)  ? valproate sodium    ? valproate sodium    ? ? ? ?PRN Meds:.acetaminophen **OR** acetaminophen, albuterol, fentaNYL (SUBLIMAZE) injection, hydrocortisone, midazolam ? ? ?I have personally reviewed following labs and imaging studies ? ?LABORATORY DATA: ?CBC: ?Recent Labs  ?Lab 02/13/22 ?0930  ?WBC 6.0  ?NEUTROABS 4.0  ?HGB 15.3*  ?HCT 51.3*  ?MCV 77.8*  ?PLT 283  ? ? ? ?Basic Metabolic Panel: ?Recent Labs  ?Lab 02/11/22 ?0052 02/13/22 ?0930 02/14/22 ?5277 02/16/22 ?0109  ?NA 133* 140 146* 143  ?K 3.6 4.7 3.7 3.8  ?CL 88* 91* 96* 94*  ?CO2 37* 37* 41* 42*  ?GLUCOSE 98 118* 97 107*  ?BUN 6* 17 18 18   ?CREATININE 0.54 0.74 0.70 0.60  ?CALCIUM 9.1 10.1 9.9 9.7  ? ? ? ?GFR: ?Estimated Creatinine Clearance: 77 mL/min (by C-G formula based on SCr of 0.6 mg/dL). ? ?Liver Function Tests: ?Recent Labs  ?Lab 02/13/22 ?0930  ?AST 28  ?ALT 22  ?ALKPHOS 51  ?BILITOT 0.5  ?PROT 7.3  ?ALBUMIN 3.3*  ? ? ?No results for input(s):  LIPASE, AMYLASE in the last 168 hours. ? ?No results for input(s): AMMONIA in the last 168 hours. ? ? ?Coagulation Profile: ?No results for input(s): INR, PROTIME in the last 168 hours. ? ?Cardiac Enzymes: ?No results for input

## 2022-02-17 ENCOUNTER — Ambulatory Visit: Payer: 59 | Admitting: Student

## 2022-02-17 ENCOUNTER — Inpatient Hospital Stay (HOSPITAL_COMMUNITY): Payer: 59

## 2022-02-17 DIAGNOSIS — I5189 Other ill-defined heart diseases: Secondary | ICD-10-CM | POA: Diagnosis not present

## 2022-02-17 DIAGNOSIS — G9341 Metabolic encephalopathy: Secondary | ICD-10-CM | POA: Diagnosis not present

## 2022-02-17 DIAGNOSIS — I10 Essential (primary) hypertension: Secondary | ICD-10-CM | POA: Diagnosis not present

## 2022-02-17 LAB — VALPROIC ACID LEVEL: Valproic Acid Lvl: 63 ug/mL (ref 50.0–100.0)

## 2022-02-17 MED ORDER — HALOPERIDOL LACTATE 5 MG/ML IJ SOLN: 2.5000 mg | Freq: Every day | INTRAMUSCULAR | Status: DC

## 2022-02-17 MED ORDER — SODIUM CHLORIDE 0.9 % IV SOLN: INTRAVENOUS | Status: AC

## 2022-02-17 MED ORDER — HALOPERIDOL LACTATE 5 MG/ML IJ SOLN: 5.0000 mg | Freq: Two times a day (BID) | INTRAMUSCULAR | Status: DC

## 2022-02-17 MED ORDER — LORAZEPAM 2 MG/ML IJ SOLN: 1.0000 mg | Freq: Every day | INTRAMUSCULAR | Status: DC | PRN

## 2022-02-17 MED ORDER — LORAZEPAM 2 MG/ML IJ SOLN: 2.0000 mg | Freq: Two times a day (BID) | INTRAMUSCULAR | Status: DC

## 2022-02-17 MED ORDER — HALOPERIDOL LACTATE 5 MG/ML IJ SOLN: 2.5000 mg | Freq: Every day | INTRAMUSCULAR | Status: DC | PRN

## 2022-02-17 MED ORDER — LORAZEPAM 2 MG/ML IJ SOLN: 2.0000 mg | Freq: Every day | INTRAMUSCULAR | Status: DC

## 2022-02-17 NOTE — Progress Notes (Signed)
RT placed patient on bipap per MD verbal order. Patient is tolerating well at this time. No complications. RT will continue to monitor. ?

## 2022-02-17 NOTE — Progress Notes (Signed)
Subjective: ?Patient is very somnolent, apparently was more awake earlier she received Haldol a couple of hours prior to my exam ? ?Exam: ?Vitals:  ? 02/17/22 1309 02/17/22 1638  ?BP:  116/64  ?Pulse: 93 100  ?Resp: 18 (!) 25  ?Temp:  99.1 ?F (37.3 ?C)  ?SpO2:    ? ?Gen: In bed, NAD ?Resp: non-labored breathing, no acute distress ?Abd: soft, nt ? ?Neuro: ?MS: Difficult to arouse, with noxious stimulation, I am able to get her to follow commands and answer questions with head shakes/nods ?CN: She fixates and tracks, but does not cooperate with field testing, face symmetric ?Motor: She follows commands in all four extremities ?Sensory: She response to mild stimulation in all four extremities ? ?Pertinent Labs: ?Valproic acid level 63 ? ?Impression: 60 year old female with acute psychosis/delirium with little previous psychiatric history.  Unfortunately, she appears to be too sedated on her current regimen, I also wonder given her history of hypercarbia if that is playing a role. ? ?With a normal level, I doubt that Depakote is playing a large role in her sedation at the current time, I will continue this. ? ?Recommendations: ?1) appreciate treatment of potential hypercarbia per internal medicine ?2) agree with decreasing sedatives per psychiatry ?3) continue Depakote 500 twice daily, recheck level again in the morning ?4) LP once able to do so. ? ? ?Ritta Slot, MD ?[facility] ?[phone] ? ?If 7pm- 7am, please page neurology on call as listed in AMION. ? ?

## 2022-02-17 NOTE — Progress Notes (Signed)
SLP Cancellation Note ? ?Patient Details ?Name: Amymarie Gal ?MRN: [id number] ?DOB: 08-21-1961 ? ? ?Cancelled treatment:       Reason Eval/Treat Not Completed: Medical issues which prohibited therapy (pt NPO this morning pending procedure). Will f/u as able. ? ? ? ?Osie Bond., M.A. CCC-SLP ?Acute Rehabilitation Services ?Office [phone] ? ?Secure chat preferred ? ?02/17/2022, 10:12 AM ?

## 2022-02-17 NOTE — Progress Notes (Signed)
RT attempted to obtain ABG without success. MD aware. RT will continue to monitor. ?

## 2022-02-17 NOTE — Progress Notes (Addendum)
?      ?                 PROGRESS NOTE ? ?      ?PATIENT DETAILS ?Name: Tamara Russell ?Age: 61 y.o. ?Sex: female ?Date of Birth: Mar 11, 1961 ?Admit Date: 02/06/2022 ?Admitting Physician Clydie Braun, MD ?VFI:EPPIR, Adrian Saran, MD ? ?Brief Summary: ?Patient is a 61 y.o.  female morbid obesity, HTN, GERD-who was just discharged from this facility on 4/23-presented to the hospital on 4/24 with altered mental status. ? ?Significant events: ?4/19-4/23>> hospitalization for chest pain, confusion-UTI.  Per notes mental status stable at discharge. ?4/24> presented to the ED for confusion-admit to [facility]. ?4/28>>Neuro eval-marital stress possibly causing AMS-psych consult recommended ?5/01>> episode of aspiration-bradycardia/worsening hypoxemia-improved after suctioning.CXR with bibasilar opacities-IV Ancef started.  BiPAP held ?5/04>>Neuro re-eval per psych rec's-?autoimmune process causing AMS ?5/05>> trial of Depakote per neurology.  LP planned under sedation on 5/6 ?5/06>> significant sedation/lethargy following Haldol/Ativan-concern for hypercarbia-ABG difficult to perform-BiPAP started.  LP postponed.  Haldol/Ativan dosage adjusted by psychiatry. ? ?Significant studies: ?4/20>> HIV: Negative ?4/20>> TSH: Not elevated ?4/24>> CT head: No acute intracranial abnormality ?4/24>> CXR: No PNA ?4/24>> RPR: Nonreactive ?4/24>> NH4: Normal ?4/24>> alcohol level: 65 ?4/26>> EEG: No seizures ?4/26>> vitamin B12: 2964 ?4/26>> vitamin B1: Normal ?4/27>> acute hepatitis serology: Negative ?4/27>>MRI brain: no acute abnormalities ?4/29>> lead levels:Normal ?5/01>>CXR:bibasilar opacities ? ?Significant microbiology data: ?4/20>> urine culture: E. coli/Citrobacter ?4/24>> COVID/influenza PCR: Negative ?4/24>> urine culture: Enterococcus faecalis ?4/24>> blood culture: Negative ? ?Procedures: ?None ? ?Consults: ?Neurology, psychiatry ? ?Subjective: ?Seen earlier in the morning-she was awake-very suspicious-paranoid-did not want me  touching her-and was pushing me away.  She subsequently got Haldol and Ativan later in the morning and has become very lethargic-she will awake briefly-but then go back to sleeping.  ABG not successful-since not accumulating secretions-I placed her on BiPAP. ? ?Objective: ?Vitals: ?Blood pressure (!) 108/59, pulse 93, temperature 98.7 ?F (37.1 ?C), temperature source Axillary, resp. rate 18, SpO2 94 %.  ? ?Exam: ?Gen Exam: Awake/alert-very suspicious this morning-but currently lethargic after receiving Ativan/Haldol. ?HEENT:atraumatic, normocephalic ?Chest: B/L clear to auscultation anteriorly ?CVS:S1S2 regular ?Abdomen:soft non tender, non distended ?Extremities:no edema ?Neurology: Non focal ?Skin: no rash  ? ?Pertinent Labs/Radiology: ? ?  Latest Ref Rng & Units 02/13/2022  ?  9:30 AM 02/09/2022  ? 12:33 AM 02/08/2022  ?  1:07 AM  ?CBC  ?WBC 4.0 - 10.5 K/uL 6.0   4.5   4.2    ?Hemoglobin 12.0 - 15.0 g/dL 51.8   84.1   66.0    ?Hematocrit 36.0 - 46.0 % 51.3   46.9   44.6    ?Platelets 150 - 400 K/uL 283   259   239    ?  ?Lab Results  ?Component Value Date  ? NA 143 02/16/2022  ? K 3.8 02/16/2022  ? CL 94 (L) 02/16/2022  ? CO2 42 (H) 02/16/2022  ? ?  ? ? ?Assessment/Plan: ?Acute metabolic encephalopathy:Initially felt to be possibly related to Hypercarbia/Partially treated UTI-and possible ETOH use (family denies any ETOH hx). Unfortunated AMS still persisted inspite of treatment of above issues.  Work-up remains neg-possibly psychiatric etiology-given significant marital stressors at home. Remains essentially the same for the past several days-very paranoid and suspicious Psych and Neuro following closely. Remains on Haldol/Ativan scheduled dosing twice daily-neurology started a trial of Depakote on 5/4.  After discussion with family-plans were to pursue LP to rule out autoimmune process-she was scheduled to  undergo LP on 5/5 and sedation-however given that she is significantly sedated after getting Haldol/Ativan  earlier this morning and requiring BiPAP-LP has now been postponed.  I have reached out to psychiatry to see if we can adjust/reassess if patient still requires Haldol/Ativan.   ? ?Continue to closely monitor-allow Haldol/Ativan to washout from his system-continue BiPAP as tolerated-she is not accumulating secretions-and is protecting her airway-she should be okay to continue BiPAP with close monitoring. ? ?Asymptomatic bacteriuria: Suspect urine culture results more suggestive of asymptomatic bacteriuria rather than a UTI. Patient had 2 different organisms in urine culture within a week in spite of treatment.  She was initially on Rocephin-plans were to start ampicillin but she has a documented allergy to PCN-hence after discussion with pharmacy- was given her 1 dose of fosfomycin.   ? ?Possible aspiration PNA: Occurred on 5/1 night-while on BiPAP-briefly required 12 L of oxygen-was quickly suctioned with significant improvement.  CXR with either PNA or pulm edema.  Remains stable on Ancef-on 2 L of oxygen-SLP following-being started on a dysphagia 1 diet.   ? ?Chronic hypoxic respiratory failure: Per family-patient was discharged on home O2 during her most recent hospitalization.  Stable on 2 L of oxygen. ? ?OSA: On BiPAP-reasonable to hold due to aspiration episode.  Will use oxygen instead. ? ?Hyponatremia: Minimal/mild-doubt this is of any clinical significance. ? ?Chronic HFpEF: Volume status stable ? ?HTN: BP stable on metoprolol ? ?HLD: Statin ? ?Debility/deconditioning: Due to acute illness-recommendations from PT are for SNF. ? ?Morbid Obesity: ?Estimated body mass index is 40.74 kg/m? as calculated from the following: ?  Height as of 11/21/21: 5\' 1"  (1.549 m). ?  Weight as of 02/05/22: 97.8 kg.  ? ?Code status: ?  Code Status: Full Code  ? ?DVT Prophylaxis: Prophylactic Lovenox  ? ?Family Communication: Daughter at bedside this afternoon-spouse early in the morning. ? ? ?Disposition Plan: ?Status is:  Inpatient ?Remains inpatient appropriate because: Continues to be confused-now aspirated overnight-on Unasyn.  Not yet stable for discharge. ?  ?Planned Discharge Destination: SNF-probably will require geriatric psychiatry placement. ? ? ?Diet: ?Diet Order   ? ?       ?  Diet NPO time specified  Diet effective midnight       ?  ? ?  ?  ? ?  ?  ? ? ?Antimicrobial agents: ?Anti-infectives (From admission, onward)  ? ? Start     Dose/Rate Route Frequency Ordered Stop  ? 02/13/22 1400  ceFAZolin (ANCEF) IVPB 2g/100 mL premix       ? 2 g ?200 mL/hr over 30 Minutes Intravenous Every 8 hours 02/13/22 0951 02/18/22 1359  ? 02/08/22 1530  fosfomycin (MONUROL) packet 3 g       ? 3 g Oral  Once 02/08/22 1430 02/08/22 1548  ? 02/07/22 1500  cefTRIAXone (ROCEPHIN) 1 g in sodium chloride 0.9 % 100 mL IVPB  Status:  Discontinued       ? 1 g ?200 mL/hr over 30 Minutes Intravenous Every 24 hours 02/07/22 1344 02/08/22 1412  ? 02/07/22 1200  cefTRIAXone (ROCEPHIN) 1 g in sodium chloride 0.9 % 100 mL IVPB  Status:  Discontinued       ? 1 g ?200 mL/hr over 30 Minutes Intravenous Every 24 hours 02/06/22 1328 02/06/22 1729  ? 02/06/22 1800  cefTRIAXone (ROCEPHIN) 1 g in sodium chloride 0.9 % 100 mL IVPB       ? 1 g ?200 mL/hr over 30 Minutes Intravenous  Once  02/06/22 1729 02/06/22 1938  ? ?  ? ? ? ?MEDICATIONS: ?Scheduled Meds: ? aspirin EC  81 mg Oral Daily  ? atorvastatin  40 mg Oral QHS  ? [START ON 02/18/2022] haloperidol lactate  5 mg Intravenous BID  ? And  ? [START ON 02/18/2022] LORazepam  2 mg Intravenous BID  ? melatonin  3 mg Oral q1800  ? metoprolol succinate  12.5 mg Oral q morning  ? sodium chloride flush  3 mL Intravenous Q12H  ? thiamine  100 mg Oral Daily  ? ?Continuous Infusions: ? sodium chloride 50 mL/hr at 02/17/22 1011  ?  ceFAZolin (ANCEF) IV 2 g (02/17/22 0603)  ? valproate sodium 500 mg (02/17/22 1056)  ? ? ? ?PRN Meds:.acetaminophen **OR** acetaminophen, albuterol, fentaNYL (SUBLIMAZE) injection, hydrocortisone,  midazolam ? ? ?I have personally reviewed following labs and imaging studies ? ?LABORATORY DATA: ?CBC: ?Recent Labs  ?Lab 02/13/22 ?0930  ?WBC 6.0  ?NEUTROABS 4.0  ?HGB 15.3*  ?HCT 51.3*  ?MCV 77.8*  ?PLT 283  ? ? ? ?B

## 2022-02-17 NOTE — Progress Notes (Signed)
Interventional radiology Brief Note ? ?IR planning to attempt LP with sedation this afternoon, however patient now on Bi-pap.  Procedure on hold per Dr. Deanne Coffer. Will continue to follow for possible procedure once respiratory status improves, likely early next week.  ? ?Loyce Dys, MS RD PA-C ? ? ?

## 2022-02-17 NOTE — Progress Notes (Addendum)
Mental status slowly improving-now a bit more awake-opens eyes to just a verbal stimuli eyes-starts shaking head when I start examining her.  Daughter at bedside-reports clinical improvement.  Since she is easily protecting her airway today-okay to continue BiPAP for another half an hour-hour.  Discussed with psychiatry MD-Dr. Levi Aland will adjust Haldol/Ativan dosing. ?

## 2022-02-17 NOTE — Consult Note (Signed)
[facility]  ? ?Reason for Consult:  AMS and psychosis ?Referring Physician:  Dr. Jerral RalphGhimire ?Patient Identification: Tamara Russell ?MRN:  [id number] ?Principal Diagnosis: Acute metabolic encephalopathy ?Diagnosis:  Principal Problem: ?  Acute metabolic encephalopathy ?Active Problems: ?  Acute respiratory failure with hypoxia and hypercapnia (HCC) ?  Hypertension ?  PUD (peptic ulcer disease) ?  Elevated troponin ?  History of UTI ?  Hypokalemia ?  Class 3 severe obesity due to excess calories with serious comorbidity and body mass index (BMI) of 40.0 to 44.9 in adult [facility](HCC) ?  Microcytosis ?  Diastolic dysfunction ? ? ?Total Time spent with patient: 30 minutes ? ?Subjective:   ?Tamara Russell is a 61 y.o. female patient  Admitted on 02/06/2022 for altered mental status and psychosis. Psychiatry consulted for AMS and psychosis.  ?Patient seen and chart reviewed- has refused multiple doses of meds. Did get olanzpaine 5 mg this AM without apparent effect. Qtc 459 on updated EKG.  ? ? ?Saw pt in AM: too sedated to interview. Did talk with daughter and explained treatment course to daughter and cousin (who is a Engineer, civil (consulting)nurse) on the phone. Was messaged by Dr. Jerral RalphGhimire asking to reassess pt given increased sedation; saw pt again around 3:45 with ongoing sedation, not awaking to voice or light touch. Have modified orders as below. ? ?Collateral ?Additional collateral from daughter: Pt with no encephalopathy at time of first hospital admission 4/19; went in with a headache initially and quickly became encephalopathic.  ? ?HPI :   ?Tamara Russell is a 61 y.o. female patient  Admitted on 02/06/2022 for altered mental status and psychosis. Psychiatry consulted for AMS and psychosis.  ? ? ? ?Past Medical History:  ?Past Medical History:  ?Diagnosis Date  ? Acid reflux   ? Complication of anesthesia   ? slow to wake up  ? History of COVID-19   ? Hypertension   ?  ?Past Surgical History:  ?Procedure  Laterality Date  ? ABDOMINAL HYSTERECTOMY    ? BIOPSY  11/17/2018  ? Procedure: BIOPSY;  Surgeon: Beverley FiedlerPyrtle, Jay M, MD;  [facility];  Service: Gastroenterology;;  ? BREAST EXCISIONAL BIOPSY Left   ? benign  ? BREAST EXCISIONAL BIOPSY Right   ? benign  ? ESOPHAGOGASTRODUODENOSCOPY (EGD) WITH PROPOFOL N/A 11/17/2018  ? Procedure: ESOPHAGOGASTRODUODENOSCOPY (EGD) WITH PROPOFOL;  Surgeon: Beverley FiedlerPyrtle, Jay M, MD;  [facility];  Service: Gastroenterology;  Laterality: N/A;  ? ?Family History:  ?Family History  ?Problem Relation Age of Onset  ? Cancer Father 3665  ?     stomach  ? Hypertension Father   ? Diabetes Maternal Grandmother   ? Colon cancer Maternal Grandfather   ? Diabetes Paternal Grandmother   ? Colon cancer Paternal Grandmother   ? ?Family history ?No family psychiatric history disclosed.  ?The patient's family history includes Cancer (age of onset: 10465) in her father; Colon cancer in her maternal grandfather and paternal grandmother; Diabetes in her maternal grandmother and paternal grandmother; Hypertension in her father. ?  ? ? ?Social History:  ?Social History  ? ?Substance and Sexual Activity  ?Alcohol Use No  ? Alcohol/week: 0.0 standard drinks  ?   ?Social History  ? ?Substance and Sexual Activity  ?Drug Use No  ?  ?Social History  ? ?Socioeconomic History  ? Marital status: Married  ?  Spouse name: Not on file  ? Number of children: Not on file  ? Years of education: Not on file  ?  Highest education level: Not on file  ?Occupational History  ? Occupation: unemployed  ?Tobacco Use  ? Smoking status: Former  ?  Packs/day: 0.50  ?  Years: 20.00  ?  Pack years: 10.00  ?  Types: Cigarettes  ?  Start date: 04/30/1975  ?  Quit date: 04/30/1995  ?  Years since quitting: 26.8  ? Smokeless tobacco: Never  ?Vaping Use  ? Vaping Use: Never used  ?Substance and Sexual Activity  ? Alcohol use: No  ?  Alcohol/week: 0.0 standard drinks  ? Drug use: No  ? Sexual activity: Not on file  ?Other Topics Concern  ?  Not on file  ?Social History Narrative  ? Not on file  ? ?Social Determinants of Health  ? ?Financial Resource Strain: Not on file  ?Food Insecurity: Not on file  ?Transportation Needs: Not on file  ?Physical Activity: Not on file  ?Stress: Not on file  ?Social Connections: Not on file  ? ?Additional Social History: ?  ? ?Allergies:   ?Allergies  ?Allergen Reactions  ? Penicillins Other (See Comments)  ?  Childhood allergy. Pt does not remember reaction  ? ? ?Labs:  ?Results for orders placed or performed during the hospital encounter of 02/06/22 (from the past 48 hour(s))  ?Basic metabolic panel     Status: Abnormal  ? Collection Time: 02/16/22  1:09 AM  ?Result Value Ref Range  ? Sodium 143 135 - 145 mmol/L  ? Potassium 3.8 3.5 - 5.1 mmol/L  ? Chloride 94 (L) 98 - 111 mmol/L  ? CO2 42 (H) 22 - 32 mmol/L  ? Glucose, Bld 107 (H) 70 - 99 mg/dL  ?  Comment: Glucose reference range applies only to samples taken after fasting for at least 8 hours.  ? BUN 18 8 - 23 mg/dL  ? Creatinine, Ser 0.60 0.44 - 1.00 mg/dL  ? Calcium 9.7 8.9 - 10.3 mg/dL  ? GFR, Estimated >60 >60 mL/min  ?  Comment: (NOTE) ?Calculated using the CKD-EPI Creatinine Equation (2021) ?  ? Anion gap 7 5 - 15  ?  Comment: Performed at [facility], [address], [place], [place] [address]  ?Valproic acid level     Status: None  ? Collection Time: 02/17/22 12:36 PM  ?Result Value Ref Range  ? Valproic Acid Lvl 63 50.0 - 100.0 ug/mL  ?  Comment: Performed at [facility], [address] [place], [place] [address]  ? ? ?Current Facility-Administered Medications  ?Medication Dose Route Frequency Provider Last Rate Last Admin  ? 0.9 %  sodium chloride infusion   Intravenous Continuous Ghimire, Werner Lean, MD 50 mL/hr at 02/17/22 1011 New Bag at 02/17/22 1011  ? acetaminophen (TYLENOL) tablet 650 mg  650 mg Oral Q6H PRN Clydie Braun, MD      ? Or  ? acetaminophen (TYLENOL) suppository 650 mg  650 mg Rectal Q6H PRN Madelyn Flavors A, MD      ?  albuterol (PROVENTIL) (2.5 MG/3ML) 0.083% nebulizer solution 2.5 mg  2.5 mg Nebulization Q6H PRN Madelyn Flavors A, MD      ? aspirin EC tablet 81 mg  81 mg Oral Daily Smith, Rondell A, MD   81 mg at 02/10/22 1331  ? atorvastatin (LIPITOR) tablet 40 mg  40 mg Oral QHS Smith, Rondell A, MD   40 mg at 02/12/22 2027  ? ceFAZolin (ANCEF) IVPB 2g/100 mL premix  2 g Intravenous Q8H Ghimire, Werner Lean, MD  200 mL/hr at 02/17/22 0603 2 g at 02/17/22 0603  ? [START ON 02/18/2022] haloperidol lactate (HALDOL) injection 2.5 mg  2.5 mg Intravenous QHS Lofton Leon A      ? And  ? [START ON 02/18/2022] LORazepam (ATIVAN) injection 2 mg  2 mg Intravenous QHS Samie Barclift A      ? haloperidol lactate (HALDOL) injection 2.5 mg  2.5 mg Intravenous Daily PRN Josiah Nieto A      ? And  ? LORazepam (ATIVAN) injection 1 mg  1 mg Intravenous Daily PRN Patience Nuzzo A      ? hydrocortisone (ANUSOL-HC) suppository 25 mg  25 mg Rectal BID PRN Maretta Bees, MD      ? melatonin tablet 3 mg  3 mg Oral q1800 Keagon Glascoe A   3 mg at 02/12/22 2027  ? metoprolol succinate (TOPROL-XL) 24 hr tablet 12.5 mg  12.5 mg Oral q morning Smith, Rondell A, MD   12.5 mg at 02/10/22 1331  ? sodium chloride flush (NS) 0.9 % injection 3 mL  3 mL Intravenous Q12H Smith, Rondell A, MD   3 mL at 02/17/22 1051  ? thiamine tablet 100 mg  100 mg Oral Daily Maretta Bees, MD   100 mg at 02/12/22 1232  ? valproate (DEPACON) 500 mg in dextrose 5 % 50 mL IVPB  500 mg Intravenous Q12H Rejeana Brock, MD 55 mL/hr at 02/17/22 1056 500 mg at 02/17/22 1056  ? ? ?Musculoskeletal: ?Strength & Muscle Tone:  did not assess; patient laying in bed ?Gait & Station: did not assess; patient laying in bed ?Patient leans:  did not assess; patient laying in bed ? ? ? ? ? ? ? ? ? ? ? ?Psychiatric Specialty Exam: ?Too sedated for PSE 5/5.  ? ? ? ?Physical Exam: ?Physical Exam ?Vitals and nursing note reviewed.  ?Constitutional:   ?    Appearance: She is obese.  ?   Comments: Sitting in bed with [place] in place  ?HENT:  ?   Head: Normocephalic and atraumatic.  ?Musculoskeletal:  ?   Comments: Minimal spontaneous movement occasionally shakes head ?

## 2022-02-18 ENCOUNTER — Inpatient Hospital Stay (HOSPITAL_COMMUNITY): Payer: 59

## 2022-02-18 DIAGNOSIS — Z6841 Body Mass Index (BMI) 40.0 and over, adult: Secondary | ICD-10-CM | POA: Diagnosis not present

## 2022-02-18 DIAGNOSIS — I10 Essential (primary) hypertension: Secondary | ICD-10-CM | POA: Diagnosis not present

## 2022-02-18 DIAGNOSIS — J9602 Acute respiratory failure with hypercapnia: Secondary | ICD-10-CM | POA: Diagnosis not present

## 2022-02-18 DIAGNOSIS — G9341 Metabolic encephalopathy: Secondary | ICD-10-CM | POA: Diagnosis not present

## 2022-02-18 DIAGNOSIS — J9601 Acute respiratory failure with hypoxia: Secondary | ICD-10-CM | POA: Diagnosis not present

## 2022-02-18 DIAGNOSIS — I5189 Other ill-defined heart diseases: Secondary | ICD-10-CM | POA: Diagnosis not present

## 2022-02-18 LAB — POCT I-STAT 7, (LYTES, BLD GAS, ICA,H+H)
Acid-Base Excess: 14 mmol/L — ABNORMAL HIGH (ref 0.0–2.0)
Acid-Base Excess: 16 mmol/L — ABNORMAL HIGH (ref 0.0–2.0)
Bicarbonate: 46 mmol/L — ABNORMAL HIGH (ref 20.0–28.0)
Bicarbonate: 44.8 mmol/L — ABNORMAL HIGH (ref 20.0–28.0)
Calcium, Ion: 1.35 mmol/L (ref 1.15–1.40)
Calcium, Ion: 1.31 mmol/L (ref 1.15–1.40)
HCT: 46 % (ref 36.0–46.0)
HCT: 46 % (ref 36.0–46.0)
Hemoglobin: 15.6 g/dL — ABNORMAL HIGH (ref 12.0–15.0)
Hemoglobin: 15.6 g/dL — ABNORMAL HIGH (ref 12.0–15.0)
O2 Saturation: 98 %
O2 Saturation: 96 %
Patient temperature: 99.6
Patient temperature: 99.6
Potassium: 4 mmol/L (ref 3.5–5.1)
Potassium: 3.8 mmol/L (ref 3.5–5.1)
Sodium: 146 mmol/L — ABNORMAL HIGH (ref 135–145)
Sodium: 146 mmol/L — ABNORMAL HIGH (ref 135–145)
TCO2: 49 mmol/L — ABNORMAL HIGH (ref 22–32)
TCO2: 47 mmol/L — ABNORMAL HIGH (ref 22–32)
pCO2 arterial: 98.7 mmHg (ref 32–48)
pCO2 arterial: 69.3 mmHg (ref 32–48)
pH, Arterial: 7.279 — ABNORMAL LOW (ref 7.35–7.45)
pH, Arterial: 7.421 (ref 7.35–7.45)
pO2, Arterial: 120 mmHg — ABNORMAL HIGH (ref 83–108)
pO2, Arterial: 89 mmHg (ref 83–108)

## 2022-02-18 LAB — BASIC METABOLIC PANEL
Anion gap: 10 (ref 5–15)
BUN: 18 mg/dL (ref 8–23)
CO2: 39 mmol/L — ABNORMAL HIGH (ref 22–32)
Calcium: 9.5 mg/dL (ref 8.9–10.3)
Chloride: 98 mmol/L (ref 98–111)
Creatinine, Ser: 0.63 mg/dL (ref 0.44–1.00)
GFR, Estimated: >60 mL/min (ref >60)
Glucose, Bld: 74 mg/dL (ref 70–99)
Potassium: 4.2 mmol/L (ref 3.5–5.1)
Sodium: 147 mmol/L — ABNORMAL HIGH (ref 135–145)

## 2022-02-18 LAB — BLOOD GAS, ARTERIAL
Acid-Base Excess: 18.8 mmol/L — ABNORMAL HIGH (ref 0.0–2.0)
Bicarbonate: 49.6 mmol/L — ABNORMAL HIGH (ref 20.0–28.0)
Drawn by: 28099
O2 Saturation: 98.3 %
Patient temperature: 37.5
pCO2 arterial: 94 mmHg (ref 32–48)
pH, Arterial: 7.33 — ABNORMAL LOW (ref 7.35–7.45)
pO2, Arterial: 83 mmHg (ref 83–108)

## 2022-02-18 LAB — CBC
HCT: 47.6 % — ABNORMAL HIGH (ref 36.0–46.0)
Hemoglobin: 13.6 g/dL (ref 12.0–15.0)
MCH: 23.6 pg — ABNORMAL LOW (ref 26.0–34.0)
MCHC: 28.6 g/dL — ABNORMAL LOW (ref 30.0–36.0)
MCV: 82.5 fL (ref 80.0–100.0)
Platelets: 250 10*3/uL (ref 150–400)
RBC: 5.77 MIL/uL — ABNORMAL HIGH (ref 3.87–5.11)
RDW: 16.8 % — ABNORMAL HIGH (ref 11.5–15.5)
WBC: 7.2 10*3/uL (ref 4.0–10.5)
nRBC: 0 % (ref 0.0–0.2)

## 2022-02-18 LAB — AMMONIA: Ammonia: 47 umol/L — ABNORMAL HIGH (ref 9–35)

## 2022-02-18 LAB — VALPROIC ACID LEVEL: Valproic Acid Lvl: 39 ug/mL — ABNORMAL LOW (ref 50.0–100.0)

## 2022-02-18 LAB — GLUCOSE, CAPILLARY: Glucose-Capillary: 86 mg/dL (ref 70–99)

## 2022-02-18 MED ORDER — FENTANYL 2500MCG IN NS 250ML (10MCG/ML) PREMIX INFUSION
50.0000 ug/h | INTRAVENOUS | Status: DC
  Administered 2022-02-18: 50 ug/h via INTRAVENOUS

## 2022-02-18 MED ORDER — SODIUM CHLORIDE 0.45 % IV SOLN
INTRAVENOUS | Status: AC
Start: 2022-02-18 — End: 2022-02-19

## 2022-02-18 MED ORDER — POLYETHYLENE GLYCOL 3350 17 G PO PACK
17.0000 g | PACK | Freq: Every day | ORAL | Status: DC
  Administered 2022-02-20 – 2022-02-24 (×4): 17 g
  Filled 2022-02-18 (×4): qty 1

## 2022-02-18 MED ORDER — CHLORHEXIDINE GLUCONATE CLOTH 2 % EX PADS
6.0000 | MEDICATED_PAD | Freq: Every day | CUTANEOUS | Status: DC
  Administered 2022-02-18 – 2022-03-16 (×28): 6 via TOPICAL

## 2022-02-18 MED ORDER — ETOMIDATE 2 MG/ML IV SOLN
INTRAVENOUS | Status: AC
  Administered 2022-02-18: 20 mg
  Filled 2022-02-18: qty 10

## 2022-02-18 MED ORDER — FENTANYL CITRATE PF 50 MCG/ML IJ SOSY: 50.0000 ug | PREFILLED_SYRINGE | Freq: Once | INTRAMUSCULAR | Status: DC

## 2022-02-18 MED ORDER — PANTOPRAZOLE 2 MG/ML SUSPENSION
40.0000 mg | Freq: Every day | ORAL | Status: DC
  Administered 2022-02-20 – 2022-03-01 (×10): 40 mg
  Filled 2022-02-18 (×10): qty 20

## 2022-02-18 MED ORDER — FENTANYL 2500MCG IN NS 250ML (10MCG/ML) PREMIX INFUSION
INTRAVENOUS | Status: AC
  Filled 2022-02-18: qty 250

## 2022-02-18 MED ORDER — FENTANYL BOLUS VIA INFUSION
50.0000 ug | INTRAVENOUS | Status: DC | PRN
  Administered 2022-02-20: 100 ug via INTRAVENOUS
  Filled 2022-02-18: qty 100

## 2022-02-18 MED ORDER — MIDAZOLAM HCL 2 MG/2ML IJ SOLN
INTRAMUSCULAR | Status: AC
  Filled 2022-02-18: qty 2

## 2022-02-18 MED ORDER — LORAZEPAM 2 MG/ML IJ SOLN: 2.0000 mg | Freq: Every day | INTRAMUSCULAR | Status: DC

## 2022-02-18 MED ORDER — HALOPERIDOL LACTATE 5 MG/ML IJ SOLN: 2.5000 mg | Freq: Every day | INTRAMUSCULAR | Status: DC

## 2022-02-18 MED ORDER — DOCUSATE SODIUM 50 MG/5ML PO LIQD
100.0000 mg | Freq: Two times a day (BID) | ORAL | Status: DC
  Administered 2022-02-20 – 2022-02-24 (×9): 100 mg
  Filled 2022-02-18 (×9): qty 10

## 2022-02-18 MED ORDER — MELATONIN 3 MG PO TABS
3.0000 mg | ORAL_TABLET | Freq: Every day | ORAL | Status: DC
  Filled 2022-02-18: qty 1

## 2022-02-18 MED ORDER — ROCURONIUM BROMIDE 10 MG/ML (PF) SYRINGE
PREFILLED_SYRINGE | INTRAVENOUS | Status: AC
  Administered 2022-02-18: 100 mg
  Filled 2022-02-18: qty 10

## 2022-02-18 NOTE — Consult Note (Addendum)
Brief Psychiatry Consult Note  ?Saw pt in AM - too sedated for interview. Did briefly wake and decline physical exam. Discussed plan of care with daughter and husband - depakote has been DC and haldol has been significantly reduced. Discussed same with Dr. Sloan Leiter. Will continue to follow. See last note for full plan.  ? ? ?- pt to get haldol/ativan 2.5/1 tonight with direction to hold for sedation (unlikely related to sedation, temporally more related to depakote)  ?- same dose available as PRN ?- repeat EKG today or tomorrow  ? ?-- While receiving IV haldol, please monitor Qtc frequently, monitor K+ and Mg+ and replete to 4 and 2 respectively.  ? ?Joycelyn Schmid A Tavaras Goody ? ?

## 2022-02-18 NOTE — Progress Notes (Signed)
Pt transported from 5W35 to 2H8 on BIPAP. No complications noted. ?

## 2022-02-18 NOTE — Progress Notes (Signed)
eLink Physician-Brief Progress Note ?Patient Name: Tamara Russell ?DOB: 28-Mar-1961 ?MRN: [id number] ? ? ?Date of Service ? 02/18/2022  ?HPI/Events of Note ? ABG on 90%/PRVC 20/TV 330/P 5 = 7.421/69.3/89.9/44.8. Patient pH is normal which suggests that she chronically has a pCO2 of about 65-70.  ?eICU Interventions ? Continue present ventilator management.  ? ? ? ?Intervention Category ?Major Interventions: Respiratory failure - evaluation and management ? ?Kolbie Clarkston Dennard Nip ?02/18/2022, 11:05 PM ?

## 2022-02-18 NOTE — Progress Notes (Signed)
Report given to [place], Charity fundraiser.  Pt. Transferred to 2H08.  Moved over to ICU bed with ICU nurse's.  No further questions from new bedside RN. ?

## 2022-02-18 NOTE — Consult Note (Signed)
? ?NAME:  Tamara Russell, MRN:  [id number], DOB:  1961/09/18, LOS: 11 ?ADMISSION DATE:  02/06/2022, CONSULTATION DATE:  02/18/22 ?REFERRING MD:  DR Sloan Leiter, CHIEF COMPLAINT:  acute respiratory failure  ? ?History of Present Illness:  ?61 year old woman with a history of obesity, OSA (untreated), hypertension, former tobacco use who was admitted on 02/06/2022 with encephalopathy and altered mental status.  She had been admitted earlier in April for same, found to be hypoxemic and treated empirically for possible urinary tract infection.  She never really returned to baseline.  Complicated subsequent course characterized by encephalopathy and evolving paranoia with agitated delirium.  Has received multiple psychotropic medications, potentially sedating medications and has required BiPAP for associated hypoxic and hypercapnic respiratory failure.  On at least 1 occasion she had an aspiration event, was noted to have bibasilar opacities on chest x-ray and Ancef was started for presumed aspiration.  Neurology has seen the patient, question possible autoimmune process and hoping for LP to be done under sedation by interventional radiology.  She has had trial of Zyprexa, Geodon, Haldol plus Ativan, Depakote.  Brain imaging reassuring, EEG reassuring. ?On 5/6 she experienced evolving lethargy.  ABG confirmed a significant respiratory acidosis, PCO2 93.  She will moved to the ICU for BiPAP and further care. ? ?Pertinent  Medical History  ? ?Past Medical History:  ?Diagnosis Date  ? Acid reflux   ? Complication of anesthesia   ? slow to wake up  ? History of COVID-19   ? Hypertension   ? ? ?Significant Hospital Events: ?Including procedures, antibiotic start and stop dates in addition to other pertinent events   ? ? ?Interim History / Subjective:  ? ?BiPAP being started now ?Depakote, other sedating medications have been held. ? ?Objective   ?Blood pressure (!) 105/57, pulse (!) 105, temperature 99.6 ?F (37.6 ?C), temperature  source Axillary, resp. rate (!) 24, height 5\' 1"  (1.549 m), SpO2 92 %. ?   ?FiO2 (%):  [50 %] 50 %  ? ?Intake/Output Summary (Last 24 hours) at 02/18/2022 1740 ?Last data filed at 02/18/2022 1637 ?Gross per 24 hour  ?Intake 2142 ml  ?Output 650 ml  ?Net 1492 ml  ? ?There were no vitals filed for this visit. ? ?Examination: ?General: Ill-appearing woman, comfortable on BiPAP ?HENT: Oropharynx dry, poor dentition, pupils equal ?Lungs: Small breaths, decreased to both bases, no wheezes or crackles ?Cardiovascular: Distant, regular, no murmur ?Abdomen: Obese, nondistended with positive bowel sounds ?Extremities: No significant edema ?Neuro: Opens eyes spontaneously.  Nodded to questions but did not follow commands ?GU: Deferred ? ?Resolved Hospital Problem list   ? ? ?Assessment & Plan:  ? ?Acute on chronic hypoxic and hypercapnic respiratory failure.  Presumed due to impact of sedating medications superimposed on her OSA/OHS.  She does have a history of tobacco, may have some degree of obstructive lung disease. ?-Initiate BiPAP now.  Low threshold to intubate if she fails to exhibit adequate airway protection to tolerate noninvasive ventilation ?-Minimize all sedating medications ? ?Encephalopathy, agitated delirium that has been associated with paranoia.  Question whether this is principally due to her hypercapnia, a psychiatric phenomenon, versus primary neurological process. ?-At this point would prefer to minimize sedating medications.  She does have Haldol and Ativan available if needed ?-Neurology following, has recommended an LP when safe to do so, probably under sedation ? ?Presumed aspiration pneumonia, completed treatment with Ancef ?-Follow chest x-ray ?-At risk for recurrent aspiration, push pulmonary hygiene ? ?Hypertension ?-Metoprolol as ordered ? ?  Best Practice (right click and "Reselect all SmartList Selections" daily)  ? ?Diet/type: NPO ?DVT prophylaxis: SCD ?GI prophylaxis: N/A ?Lines: N/A ?Foley:   N/A ?Code Status:  full code ?Last date of multidisciplinary goals of care discussion [Pending] ?Family: Husband updated at bedside with regard to plans to move to the ICU 5/6 ? ?Labs   ?CBC: ?Recent Labs  ?Lab 02/13/22 ?0930 02/18/22 ?0034  ?WBC 6.0 7.2  ?NEUTROABS 4.0  --   ?HGB 15.3* 13.6  ?HCT 51.3* 47.6*  ?MCV 77.8* 82.5  ?PLT 283 250  ? ? ?Basic Metabolic Panel: ?Recent Labs  ?Lab 02/13/22 ?0930 02/14/22 ?[id number] 02/16/22 ?0109 02/18/22 ?0034  ?NA 140 146* 143 147*  ?K 4.7 3.7 3.8 4.2  ?CL 91* 96* 94* 98  ?CO2 37* 41* 42* 39*  ?GLUCOSE 118* 97 107* 74  ?BUN 17 18 18 18   ?CREATININE 0.74 0.70 0.60 0.63  ?CALCIUM 10.1 9.9 9.7 9.5  ? ?GFR: ?Estimated Creatinine Clearance: 77 mL/min (by C-G formula based on SCr of 0.63 mg/dL). ?Recent Labs  ?Lab 02/13/22 ?0930 02/18/22 ?0034  ?WBC 6.0 7.2  ? ? ?Liver Function Tests: ?Recent Labs  ?Lab 02/13/22 ?0930  ?AST 28  ?ALT 22  ?ALKPHOS 51  ?BILITOT 0.5  ?PROT 7.3  ?ALBUMIN 3.3*  ? ?No results for input(s): LIPASE, AMYLASE in the last 168 hours. ?No results for input(s): AMMONIA in the last 168 hours. ? ?ABG ?   ?Component Value Date/Time  ? PHART 7.33 (L) 02/18/2022 1703  ? PCO2ART 94 (HH) 02/18/2022 1703  ? PO2ART 83 02/18/2022 1703  ? HCO3 49.6 (H) 02/18/2022 1703  ? TCO2 >50 (H) 02/06/2022 1351  ? O2SAT 98.3 02/18/2022 1703  ?  ? ?Coagulation Profile: ?No results for input(s): INR, PROTIME in the last 168 hours. ? ?Cardiac Enzymes: ?No results for input(s): CKTOTAL, CKMB, CKMBINDEX, TROPONINI in the last 168 hours. ? ?HbA1C: ?Hgb A1c MFr Bld  ?Date/Time Value Ref Range Status  ?04/21/2016 03:28 PM 5.6 <5.7 % Final  ?  Comment:  ?    ?For the purpose of screening for the presence of diabetes: ?  ?<5.7%       Consistent with the absence of diabetes ?5.7-6.4 %   Consistent with increased risk for diabetes (prediabetes) ?>=6.5 %     Consistent with diabetes ?  ?This assay result is consistent with a decreased risk of diabetes. ?  ?Currently, no consensus exists regarding use  of hemoglobin A1c for ?diagnosis of diabetes in children. ?  ?According to American Diabetes Association (ADA) guidelines, ?hemoglobin A1c <7.0% represents optimal control in non-pregnant ?diabetic patients. Different metrics may apply to specific patient ?populations. Standards of Medical Care in Diabetes (ADA). ?  ?  ? ? ?CBG: ?Recent Labs  ?Lab 02/13/22 ?1618 02/18/22 ?1636  ?GLUCAP 95 86  ? ? ?Review of Systems:   ?Unable to obtain ? ?Past Medical History:  ?She,  has a past medical history of Acid reflux, Complication of anesthesia, History of COVID-19, and Hypertension.  ? ?Surgical History:  ? ?Past Surgical History:  ?Procedure Laterality Date  ? ABDOMINAL HYSTERECTOMY    ? BIOPSY  11/17/2018  ? Procedure: BIOPSY;  Surgeon: Jerene Bears, MD;  Location[facility] ENDOSCOPY;  Service: Gastroenterology;;  ? BREAST EXCISIONAL BIOPSY Left   ? benign  ? BREAST EXCISIONAL BIOPSY Right   ? benign  ? ESOPHAGOGASTRODUODENOSCOPY (EGD) WITH PROPOFOL N/A 11/17/2018  ? Procedure: ESOPHAGOGASTRODUODENOSCOPY (EGD) WITH PROPOFOL;  Surgeon: Jerene Bears, MD;  Location: [facility]  ENDOSCOPY;  Service: Gastroenterology;  Laterality: N/A;  ?  ? ?Social History:  ? reports that she quit smoking about 26 years ago. Her smoking use included cigarettes. She started smoking about 46 years ago. She has a 10.00 pack-year smoking history. She has never used smokeless tobacco. She reports that she does not drink alcohol and does not use drugs.  ? ?Family History:  ?Her family history includes Cancer (age of onset: 50) in her father; Colon cancer in her maternal grandfather and paternal grandmother; Diabetes in her maternal grandmother and paternal grandmother; Hypertension in her father.  ? ?Allergies ?Allergies  ?Allergen Reactions  ? Penicillins Other (See Comments)  ?  Childhood allergy. Pt does not remember reaction  ?  ? ?Home Medications  ?Prior to Admission medications   ?Medication Sig Start Date End Date Taking? Authorizing Provider   ?acetaminophen (TYLENOL) 500 MG tablet Take 1 tablet (500 mg total) by mouth every 6 (six) hours as needed for moderate pain. Reported on 04/21/2016 02/05/22  Yes Dana Allan I, MD  ?aspirin EC 81 MG tablet Take 8

## 2022-02-18 NOTE — Progress Notes (Signed)
Subjective: ?Patient continues to be very sedated ? ?Exam: ?Vitals:  ? 02/18/22 1600 02/18/22 1636  ?BP: (!) 105/57   ?Pulse:    ?Resp: (!) 24 (!) 24  ?Temp:  99.6 ?F (37.6 ?C)  ?SpO2: 91% 92%  ? ?Gen: In bed, NAD ?Resp: non-labored breathing, no acute distress ?Abd: soft, nt ? ?Neuro: ?MS: awakens to noxious stimulation, with noxious stimulation I am able to get her to follow commands ?CN: Pupils are reactive, she does fixate and track after neck stimulation ?Motor: Follows commands bilaterally ?Sensory: Response to noxious stimulation x4 ? ?Pertinent Labs: ?Depakote level 39 ? ?Impression: 61 year old female with acute psychosis/delirium with little previous psychiatric history.  This degree of sedation from valproic acid at this level would be very unusual, but it does temporally correlate.  It has been discontinued at this time.  It can cause hyperammonemia, but usually not this acutely but it is worthwhile to check.  She has a history of hypercarbia, and I think that that is a more likely explanation, ABG is pending ? ?Recommendations: ?1) hold sedative medications ?2) agree with ABG ?3) agree with ammonia level ?4) LP is planned for early next week ?5) neurology will continue to follow ? ?Roland Rack, MD ?[facility] ?[phone] ? ?If 7pm- 7am, please page neurology on call as listed in [place]. ? ?

## 2022-02-18 NOTE — Progress Notes (Signed)
She has not had any Depakote/Ativan/Haldol today-however her mentation continues to wax and wane.She was more awake this morning, currently she is lethargic- will open eyes to a loud verbal stimuli at times-sometimes she requires a gentle sternal rub.  Other vital signs are stable.  She is not drooling-nor is she accumulating secretions.  Checking ABG and ammonia levels.  Discussed with neurology-suspicion still for toxic encephalopathy/Possible hypercarbia from all psychotropic medications she got yesterday-we will await results of ABG/ammonia levels. ?

## 2022-02-18 NOTE — Progress Notes (Addendum)
?      ?                 PROGRESS NOTE ? ?      ?PATIENT DETAILS ?Name: Tamara Russell ?Age: 61 y.o. ?Sex: female ?Date of Birth: 1961/08/20 ?Admit Date: 02/06/2022 ?Admitting Physician Clydie Braun, MD ?[id number], Adrian Saran, MD ? ?Brief Summary: ?Patient is a 61 y.o.  female morbid obesity, HTN, GERD-who was just discharged from this facility on 4/23-presented to the hospital on 4/24 with altered mental status. ? ?Significant events: ?4/19-4/23>> hospitalization for chest pain, confusion-UTI.  Per notes mental status stable at discharge. ?4/24> presented to the ED for confusion-admit to [facility]. ?4/28>>Neuro eval-marital stress possibly causing AMS-psych consult recommended ?5/01>> episode of aspiration-bradycardia/worsening hypoxemia-improved after suctioning.CXR with bibasilar opacities-IV Ancef started.  BiPAP held ?5/04>>Neuro re-eval per psych rec's-?autoimmune process causing AMS. Still agitated despite trial of zyprexa, geodon, halold/ativan. Started on a trial of depakote.LP planned under sedation on 5/6 after d/w radiology. ?5/05>> significant sedation/lethargy following Haldol/Ativan-concern for hypercarbia-ABG difficult to obtain-BiPAP started.  LP postponed till next week.  Haldol/Ativan decreased  by psychiatry. ?5/06>>more awake/alert-depkote d/c'd after d/w Neuro ? ?Significant studies: ?4/20>> HIV: Negative ?4/20>> TSH: Not elevated ?4/24>> CT head: No acute intracranial abnormality ?4/24>> CXR: No PNA ?4/24>> RPR: Nonreactive ?4/24>> NH4: Normal ?4/24>> alcohol level: 65 ?4/26>> EEG: No seizures ?4/26>> vitamin B12: 2964 ?4/26>> vitamin B1: Normal ?4/27>> acute hepatitis serology: Negative ?4/27>>MRI brain: no acute abnormalities ?4/29>> lead levels:Normal ?5/01>>CXR:bibasilar opacities ? ?Significant microbiology data: ?4/20>> urine culture: E. coli/Citrobacter ?4/24>> COVID/influenza PCR: Negative ?4/24>> urine culture: Enterococcus faecalis ?4/24>> blood culture:  Negative ? ?Procedures: ?None ? ?Consults: ?Neurology, psychiatry ? ?Subjective: ?Much more awake and alert compared to yesterday but still somewhat lethargic.Easily controlling secretions.  When awake-and when she sees me-she does not want me touching her and starts saying "no" and trying to push me away. ? ?Objective: ?Vitals: ?Blood pressure 127/81, pulse (!) 102, temperature 99.2 ?F (37.3 ?C), temperature source Axillary, resp. rate (!) 24, SpO2 94 %.  ? ?Exam: ?Gen Exam:not in any distress ?HEENT:atraumatic, normocephalic ?Chest: B/L clear to auscultation anteriorly ?CVS:S1S2 regular ?Abdomen:soft non tender, non distended ?Extremities:no edema ?Neurology: Non focal ?Skin: no rash  ? ?Pertinent Labs/Radiology: ? ?  Latest Ref Rng & Units 02/18/2022  ? 12:34 AM 02/13/2022  ?  9:30 AM 02/09/2022  ? 12:33 AM  ?CBC  ?WBC 4.0 - 10.5 K/uL 7.2   6.0   4.5    ?Hemoglobin 12.0 - 15.0 g/dL 56.2   13.0   86.5    ?Hematocrit 36.0 - 46.0 % 47.6   51.3   46.9    ?Platelets 150 - 400 K/uL 250   283   259    ?  ?Lab Results  ?Component Value Date  ? NA 147 (H) 02/18/2022  ? K 4.2 02/18/2022  ? CL 98 02/18/2022  ? CO2 39 (H) 02/18/2022  ? ?  ? ? ?Assessment/Plan: ?Acute metabolic encephalopathy:Initially felt to be possibly related to Hypercarbia/Partially treated UTI-and possible ETOH use (family denies any ETOH hx). Unfortunately-she continued to be confused-and subsequently developed paranoia/extreme suspicion of nursing/MD staff.  No clear-cut etiology on extensive work-up.  Evaluated by both neurology and psychiatry-possibly psychogenic etiology given significant marital stressors at home-however her paranoia/psychosis/agitation persisted in spite of treatment with Zyprexa, Haldol and Geodon.  Subsequently re-evaluated by neurology-with plans to pursue LP under sedation/anesthesia (to r/o autoimmune process) and was started on a trial of Depakote by neurology. She  was already on scheduled Haldol/Ativan-unfortunately on 5/5 she  developed severe encephalopathy/lethargy (likely due to Depakote/Ativan/Haldol)-with concern for CO2 narcosis.  ABG was not able to be obtained due to lack of patient cooperation-she was empirically treated with BiPAP.  Thankfully she is much better-although still somewhat lethargic.After discussion with Dr. Amada Jupiter today-plan is to hold further doses of Depakote.  She remains on Haldol/Ativan (dose reduced on 5/5)-we will await recommendations from psychiatry.  However-due to sedation/BIPAP use on 5/5-lumbar puncture has been postponed till next week-re-consult radiology on Monday for LP under sedation protocol.  ? ?Difficult situation-patient has OSA physiology/hypercarbia/chronic hypoxemia-and is at risk of hypercarbia if she is excessively sedated-however given her persistent paranoia/encephalopathy-she will likely need psychotropic agents to control her psychotic symptoms.  Family aware of difficult situation and the need for titration and close monitoring. ? ?Asymptomatic bacteriuria: Suspect urine culture results more suggestive of asymptomatic bacteriuria rather than a UTI. Patient had 2 different organisms in urine culture within a week in spite of treatment.  She was initially on Rocephin-plans were to start ampicillin but she has a documented allergy to PCN-hence after discussion with pharmacy- was given her 1 dose of fosfomycin.   ? ?Possible aspiration PNA: Occurred on 5/1 night-while on BiPAP-briefly required 12 L of oxygen-was quickly suctioned with significant improvement.  CXR with either PNA or pulm edema.  Treated with Ancef-Per family-was tolerating dysphagia 1 diet.   ? ?Chronic hypoxic respiratory failure: Per family-patient was discharged on home O2 during her most recent hospitalization.  Stable on 2 L of oxygen. ? ?OSA: Was on BiPAP during the initial part of her hospital stay-since she developed a aspiration episode-this was held.  However she did tolerate BiPAP on 5/5 without any  issues for a brief period of time when she developed encephalopathy wit concern for hypercarbia . ? ?Hypernatremia: Minimal/mild-likely due to no oral intake yesterday as she was very encephalopathic-gently hydrate today-recheck electrolytes tomorrow.  ? ?Chronic HFpEF: Volume status stable ? ?HTN: BP stable on metoprolol ? ?HLD: Statin ? ?Debility/deconditioning: Due to acute illness-recommendations from PT are for SNF. ? ?Morbid Obesity: ?Estimated body mass index is 40.74 kg/m? as calculated from the following: ?  Height as of 11/21/21: 5\' 1"  (1.549 m). ?  Weight as of 02/05/22: 97.8 kg.  ? ?Code status: ?  Code Status: Full Code  ? ?DVT Prophylaxis: Prophylactic Lovenox  ? ?Family Communication: Daughter at bedside  ? ?Disposition Plan: ?Status is: Inpatient ?Remains inpatient appropriate because: Continues to be encephalopathic-Not yet stable for discharge. ?  ?Planned Discharge Destination: SNF-probably will require geriatric psychiatry placement. ? ? ?Diet: ?Diet Order   ? ?       ?  DIET - DYS 1 Room service appropriate? Yes; Fluid consistency: Thin  Diet effective now       ?  ? ?  ?  ? ?  ?  ? ? ?Antimicrobial agents: ?Anti-infectives (From admission, onward)  ? ? Start     Dose/Rate Route Frequency Ordered Stop  ? 02/13/22 1400  ceFAZolin (ANCEF) IVPB 2g/100 mL premix       ? 2 g ?200 mL/hr over 30 Minutes Intravenous Every 8 hours 02/13/22 0951 02/18/22 1359  ? 02/08/22 1530  fosfomycin (MONUROL) packet 3 g       ? 3 g Oral  Once 02/08/22 1430 02/08/22 1548  ? 02/07/22 1500  cefTRIAXone (ROCEPHIN) 1 g in sodium chloride 0.9 % 100 mL IVPB  Status:  Discontinued       ?  1 g ?200 mL/hr over 30 Minutes Intravenous Every 24 hours 02/07/22 1344 02/08/22 1412  ? 02/07/22 1200  cefTRIAXone (ROCEPHIN) 1 g in sodium chloride 0.9 % 100 mL IVPB  Status:  Discontinued       ? 1 g ?200 mL/hr over 30 Minutes Intravenous Every 24 hours 02/06/22 1328 02/06/22 1729  ? 02/06/22 1800  cefTRIAXone (ROCEPHIN) 1 g in sodium  chloride 0.9 % 100 mL IVPB       ? 1 g ?200 mL/hr over 30 Minutes Intravenous  Once 02/06/22 1729 02/06/22 1938  ? ?  ? ? ? ?MEDICATIONS: ?Scheduled Meds: ? aspirin EC  81 mg Oral Daily  ? atorvastatin  40 mg Oral QHS  ? haloperidol lactate  2

## 2022-02-18 NOTE — Progress Notes (Signed)
Pt placed on BIPAP per MD order. Pt is tolerating well at this time. RT to continue to monitor. 

## 2022-02-18 NOTE — Progress Notes (Addendum)
ABG shows worsening hypercarbia-place on BIPAP-d/w Dr Nena Alexander lethergy-prior aspiration episode on BIPAP-will move to ICU for close monitoring.  ?

## 2022-02-18 NOTE — Procedures (Signed)
Intubation Procedure Note ? ?Rosealee Mae Cope  ?[id number]  ?1961/07/16 ? ?Date:02/18/22  ?Time:9:37 PM  ? ?Provider Performing:Baani Bober Humphrey Rolls  ? ? ?Procedure: Intubation (31500) ? ?Indication(s) ?Respiratory Failure ? ?Consent ?Risks of the procedure as well as the alternatives and risks of each were explained to the patient and/or caregiver.  Consent for the procedure was obtained and is signed in the bedside chart ? ? ?Anesthesia ?Etomidate and Rocuronium ? ? ?Time Out ?Verified patient identification, verified procedure, site/side was marked, verified correct patient position, special equipment/implants available, medications/allergies/relevant history reviewed, required imaging and test results available. ? ? ?Sterile Technique ?Usual hand hygeine, masks, and gloves were used ? ? ?Procedure Description ?Patient positioned in bed supine.  Sedation given as noted above.  Patient was intubated with endotracheal tube using Glidescope.  View was Grade 2 only posterior commissure .  Number of attempts was 1.  Colorimetric CO2 detector was consistent with tracheal placement. ? ? ?Complications/Tolerance ?None; patient tolerated the procedure well. ?Chest X-ray is ordered to verify placement. ? ? ?EBL ?Minimal ? ? ?Specimen(s) ?None ? ?

## 2022-02-19 ENCOUNTER — Inpatient Hospital Stay (HOSPITAL_COMMUNITY): Payer: 59

## 2022-02-19 DIAGNOSIS — G9341 Metabolic encephalopathy: Secondary | ICD-10-CM | POA: Diagnosis not present

## 2022-02-19 LAB — BASIC METABOLIC PANEL
Anion gap: 10 (ref 5–15)
BUN: 25 mg/dL — ABNORMAL HIGH (ref 8–23)
CO2: 38 mmol/L — ABNORMAL HIGH (ref 22–32)
Calcium: 9.6 mg/dL (ref 8.9–10.3)
Chloride: 100 mmol/L (ref 98–111)
Creatinine, Ser: 0.77 mg/dL (ref 0.44–1.00)
GFR, Estimated: >60 mL/min (ref >60)
Glucose, Bld: 72 mg/dL (ref 70–99)
Potassium: 4.4 mmol/L (ref 3.5–5.1)
Sodium: 148 mmol/L — ABNORMAL HIGH (ref 135–145)

## 2022-02-19 LAB — MAGNESIUM: Magnesium: 2.5 mg/dL — ABNORMAL HIGH (ref 1.7–2.4)

## 2022-02-19 MED ORDER — ETOMIDATE 2 MG/ML IV SOLN
20.0000 mg | Freq: Once | INTRAVENOUS | Status: AC
  Administered 2022-02-19: 20 mg via INTRAVENOUS
  Filled 2022-02-19: qty 10

## 2022-02-19 MED ORDER — FENTANYL 2500MCG IN NS 250ML (10MCG/ML) PREMIX INFUSION
0.0000 ug/h | INTRAVENOUS | Status: DC
  Administered 2022-02-19 – 2022-02-20 (×4): 200 ug/h via INTRAVENOUS
  Filled 2022-02-19 (×4): qty 250

## 2022-02-19 MED ORDER — METOPROLOL TARTRATE 25 MG/10 ML ORAL SUSPENSION
12.5000 mg | Freq: Every day | ORAL | Status: DC
  Administered 2022-02-20 – 2022-02-23 (×4): 12.5 mg
  Filled 2022-02-19 (×4): qty 10

## 2022-02-19 MED ORDER — THIAMINE HCL 100 MG PO TABS
100.0000 mg | ORAL_TABLET | Freq: Every day | ORAL | Status: DC
  Administered 2022-02-20 – 2022-03-01 (×9): 100 mg
  Filled 2022-02-19 (×9): qty 1

## 2022-02-19 MED ORDER — DEXMEDETOMIDINE HCL IN NACL 400 MCG/100ML IV SOLN
0.4000 ug/kg/h | INTRAVENOUS | Status: DC
  Administered 2022-02-19: 0.4 ug/kg/h via INTRAVENOUS
  Administered 2022-02-19 – 2022-02-20 (×2): 0.5 ug/kg/h via INTRAVENOUS
  Administered 2022-02-21: 0.4 ug/kg/h via INTRAVENOUS
  Filled 2022-02-19 (×6): qty 100

## 2022-02-19 NOTE — Progress Notes (Signed)
EEG complete - results pending 

## 2022-02-19 NOTE — Progress Notes (Signed)
eLink Physician-Brief Progress Note ?Patient Name: Tamara Russell ?DOB: 11/12/1960 ?MRN: [id number] ? ? ?Date of Service ? 02/19/2022  ?HPI/Events of Note ? Patient not adequately sedated on Fentanyl IV infusion. Waking up and appears uncomfortable.   ?eICU Interventions ? Plan: ?Increase ceiling on Fentanyl IV infusion to 400 mcg/hour. Titrate to RASS = 0 to -1.   ? ? ? ?Intervention Category ?Major Interventions: Other: ? ?Cian Costanzo Dennard Nip ?02/19/2022, 1:33 AM ?

## 2022-02-19 NOTE — Procedures (Signed)
Patient Name: Tamara Russell  ?MRN: [id number]  ?Epilepsy Attending: Charlsie Quest  ?Referring Physician/Provider: Rejeana Brock, MD ?Date: 02/19/2022 ?Duration: 21.42 mins ? ?Patient history:  61 year old female with acute psychosis/delirium with little previous psychiatric history. EEG to evaluate for seizure ? ?Level of alertness: lethargic  ? ?AEDs during EEG study: None ? ?Technical aspects: This EEG study was done with scalp electrodes positioned according to the 10-20 International system of electrode placement. Electrical activity was acquired at a sampling rate of 500Hz  and reviewed with a high frequency filter of 70Hz  and a low frequency filter of 1Hz . EEG data were recorded continuously and digitally stored.  ? ?Description: EEG showed continuous generalized 3 to 6 Hz theta-delta slowing. Hyperventilation and photic stimulation were not performed.    ? ?ABNORMALITY ?- Continuous slow, generalized ? ?IMPRESSION: ?This study is suggestive of moderate to severe diffuse encephalopathy, nonspecific etiology. No seizures or epileptiform discharges were seen throughout the recording. ? ?  ? ?

## 2022-02-19 NOTE — Consult Note (Signed)
Brief Psychiatry Consult Note  ?Pt intubated overnight ~36 hours after last haldol dose and in ICU. Discussed briefly with Dr. Tamala Julian - no role for psychiatry today. Will leave on list and follow from afar as medical condition improves.  ? ? ?Tamara Russell A [name] ? ?

## 2022-02-19 NOTE — Progress Notes (Signed)
? ?  NAME:  Tamara Russell, MRN:  [id number], DOB:  17-Feb-1961, LOS: 12 ?ADMISSION DATE:  02/06/2022, CONSULTATION DATE:  02/18/22 ?REFERRING MD:  DR Jerral Ralph, CHIEF COMPLAINT:  acute respiratory failure  ? ?History of Present Illness:  ?61 year old woman with a history of obesity, OSA (untreated), hypertension, former tobacco use who was admitted on 02/06/2022 with encephalopathy and altered mental status.  She had been admitted earlier in April for same, found to be hypoxemic and treated empirically for possible urinary tract infection.  She never really returned to baseline.  Complicated subsequent course characterized by encephalopathy and evolving paranoia with agitated delirium.  Has received multiple psychotropic medications, potentially sedating medications and has required BiPAP for associated hypoxic and hypercapnic respiratory failure.  On at least 1 occasion she had an aspiration event, was noted to have bibasilar opacities on chest x-ray and Ancef was started for presumed aspiration.  Neurology has seen the patient, question possible autoimmune process and hoping for LP to be done under sedation by interventional radiology.  She has had trial of Zyprexa, Geodon, Haldol plus Ativan, Depakote.  Brain imaging reassuring, EEG reassuring. ?On 5/6 she experienced evolving lethargy.  ABG confirmed a significant respiratory acidosis, PCO2 93.  She will moved to the ICU for BiPAP and further care. ? ?Pertinent  Medical History  ? ?Past Medical History:  ?Diagnosis Date  ? Acid reflux   ? Complication of anesthesia   ? slow to wake up  ? History of COVID-19   ? Hypertension   ? ? ?Significant Hospital Events: ?Including procedures, antibiotic start and stop dates in addition to other pertinent events   ?5/7 intubated ? ?Interim History / Subjective:  ?Intubated early AM. ?Awake on vent, following commands ?Too weak on SBT for extubation at present ? ?Objective   ?Blood pressure 108/68, pulse 77, temperature 99.6 ?F  (37.6 ?C), temperature source Axillary, resp. rate 16, height 5\' 1"  (1.549 m), SpO2 91 %. ?   ?Vent Mode: AC ?FiO2 (%):  [50 %-100 %] 60 % ?Set Rate:  [20 bmp] 20 bmp ?Vt Set:  [330 mL] 330 mL ?PEEP:  [5 cmH20] 5 cmH20 ?Plateau Pressure:  [18 cmH20-23 cmH20] 18 cmH20  ? ?Intake/Output Summary (Last 24 hours) at 02/19/2022 1133 ?Last data filed at 02/19/2022 0800 ?Gross per 24 hour  ?Intake 1031.39 ml  ?Output 500 ml  ?Net 531.39 ml  ? ? ?There were no vitals filed for this visit. ? ?Examination: ?Sitting up with ETT in place ?Ext warm ?Lungs clear ?Heart sounds regular, ext warm ?Moves ext to command ?RASS +1-2 ? ?Mild hypernatremia ?Cr benign ?H/H stable ? ?Resolved Hospital Problem list   ? ? ?Assessment & Plan:  ?Acute on chronic hypercapneic resp failure- underlying untreated OSA, prolonged hospital course, delirium leading to inability to blow off CO2- resolved with vent ? ?Unusual psychosis/delirium/encephalopathy picture being follow by both psychiatry and neurology ? ?Muscular deconditioning ? ?HTN, GERD ? ?- Sedate today for LP, will attempt at bedside, otherwise to IR ?- Too weak for extubation at present, likely will try in AM after cortrak placed.  Continue vent bundle. ?- Husband updated ? ?Best Practice (right click and "Reselect all SmartList Selections" daily)  ? ?Diet/type: NPO ?DVT prophylaxis: SCD ?GI prophylaxis: N/A ?Lines: N/A ?Foley:  N/A ?Code Status:  full code ?Last date of multidisciplinary goals of care discussion [Pending] ?Family: Husband updated at bedside 5/7 ? ?34 min cc time ?7/7 MD PCCM ? ?

## 2022-02-19 NOTE — Progress Notes (Signed)
Subjective: ?ABG yesterday showed very high co2, transferred to ICU for bipap, needed intubation ? ?Exam: ?Vitals:  ? 02/19/22 0600 02/19/22 0735  ?BP: 101/69 101/68  ?Pulse: 75   ?Resp: 20   ?Temp:    ?SpO2: 93%   ? ?Gen: In bed, NAD ?Resp: non-labored breathing, no acute distress ?Abd: soft, nt ? ?Neuro: ?MS: awakens to mild stimulation, she fixates me and tracks, and looks at me angrily, but does not follow commands ?CN: Pupils are reactive, he fixates and tracks across midline ?Motor: She moves extremities spontaneously but does not follow commands ?Sensory: Response to noxious stimulation x4 ? ?Pertinent Labs: ?4/26-B1-133 (normal) ?4/26 B12 2964 ?RPR-nonreactive ?UDS is negative ?Cortisol was normal ?TSH 1.37 ?Ammonia 35 ?CSF and serum autoimmune panels-pending ? ?Impression: 61 year old female with acute psychosis/delirium with little previous psychiatric history.  Given the very unusual presentation, there has been the possibility of an autoimmune process being raised.  Given that empiric treatment would involve steroids, which can worsen delirium, I have been hesitant to pursue this without further information.  She was started on Ativan/Haldol per psychiatry and I started Depakote for refractory delirium.  Her mental status worsened.  I suspect that this was due to underlying OSA with hypercarbia in the setting of mild sedation.  Unfortunately, this has delayed her LP, it is planned under IR for earlier this week. ? ? ?Recommendations: ?1) LP early this week. ?2) will need to reassess after extubation.  ? ?Ritta Slot, MD ?[facility] ?[phone] ? ?If 7pm- 7am, please page neurology on call as listed in AMION. ? ?

## 2022-02-19 NOTE — Progress Notes (Signed)
PT intubated post ABG result showing no improvement of CO2. Tube secured with bilateral BS. Will continue to monitor.  ?

## 2022-02-19 NOTE — Progress Notes (Signed)
Attempted LP, unsuccessful, appreciate IR help. ? ?Myrla Halsted MD PCCM ?

## 2022-02-20 ENCOUNTER — Inpatient Hospital Stay (HOSPITAL_COMMUNITY): Payer: 59

## 2022-02-20 DIAGNOSIS — G9341 Metabolic encephalopathy: Secondary | ICD-10-CM | POA: Diagnosis not present

## 2022-02-20 LAB — GLUCOSE, CSF: Glucose, CSF: 73 mg/dL — ABNORMAL HIGH (ref 40–70)

## 2022-02-20 LAB — CSF CELL COUNT WITH DIFFERENTIAL
RBC Count, CSF: 24 /mm3 — ABNORMAL HIGH
Tube #: 3
WBC, CSF: 1 /mm3 (ref 0–5)

## 2022-02-20 LAB — PHOSPHORUS: Phosphorus: 3 mg/dL (ref 2.5–4.6)

## 2022-02-20 LAB — CBC
HCT: 46.7 % — ABNORMAL HIGH (ref 36.0–46.0)
Hemoglobin: 14 g/dL (ref 12.0–15.0)
MCH: 23.8 pg — ABNORMAL LOW (ref 26.0–34.0)
MCHC: 30 g/dL (ref 30.0–36.0)
MCV: 79.3 fL — ABNORMAL LOW (ref 80.0–100.0)
Platelets: 268 10*3/uL (ref 150–400)
RBC: 5.89 MIL/uL — ABNORMAL HIGH (ref 3.87–5.11)
RDW: 17.3 % — ABNORMAL HIGH (ref 11.5–15.5)
WBC: 8.8 10*3/uL (ref 4.0–10.5)
nRBC: 0 % (ref 0.0–0.2)

## 2022-02-20 LAB — GLUCOSE, CAPILLARY
Glucose-Capillary: 188 mg/dL — ABNORMAL HIGH (ref 70–99)
Glucose-Capillary: 176 mg/dL — ABNORMAL HIGH (ref 70–99)

## 2022-02-20 LAB — BASIC METABOLIC PANEL
Anion gap: 9 (ref 5–15)
BUN: 22 mg/dL (ref 8–23)
CO2: 35 mmol/L — ABNORMAL HIGH (ref 22–32)
Calcium: 9.5 mg/dL (ref 8.9–10.3)
Chloride: 105 mmol/L (ref 98–111)
Creatinine, Ser: 0.76 mg/dL (ref 0.44–1.00)
GFR, Estimated: >60 mL/min (ref >60)
Glucose, Bld: 108 mg/dL — ABNORMAL HIGH (ref 70–99)
Potassium: 3.9 mmol/L (ref 3.5–5.1)
Sodium: 149 mmol/L — ABNORMAL HIGH (ref 135–145)

## 2022-02-20 LAB — MAGNESIUM
Magnesium: 2.1 mg/dL (ref 1.7–2.4)
Magnesium: 2 mg/dL (ref 1.7–2.4)

## 2022-02-20 LAB — PROTEIN, CSF: Total  Protein, CSF: 33 mg/dL (ref 15–45)

## 2022-02-20 MED ORDER — ATORVASTATIN CALCIUM 40 MG PO TABS
40.0000 mg | ORAL_TABLET | Freq: Every day | ORAL | Status: DC
Start: 2022-02-20 — End: 2022-03-01
  Administered 2022-02-20 – 2022-02-28 (×9): 40 mg
  Filled 2022-02-20 (×9): qty 1

## 2022-02-20 MED ORDER — VITAL AF 1.2 CAL PO LIQD
1000.0000 mL | ORAL | Status: DC
  Administered 2022-02-20 – 2022-02-23 (×3): 1000 mL

## 2022-02-20 MED ORDER — DEXTROSE 5 % IV SOLN
INTRAVENOUS | Status: DC
Start: 2022-02-20 — End: 2022-02-23

## 2022-02-20 MED ORDER — MELATONIN 3 MG PO TABS
3.0000 mg | ORAL_TABLET | Freq: Every day | ORAL | Status: DC
Start: 2022-02-20 — End: 2022-02-21
  Administered 2022-02-20: 3 mg
  Filled 2022-02-20: qty 1

## 2022-02-20 MED ORDER — ASPIRIN 81 MG PO CHEW
81.0000 mg | CHEWABLE_TABLET | Freq: Every day | ORAL | Status: DC
  Administered 2022-02-21 – 2022-03-01 (×9): 81 mg
  Filled 2022-02-20 (×9): qty 1

## 2022-02-20 MED ORDER — LIDOCAINE HCL (PF) 1 % IJ SOLN
2.0000 mL | Freq: Once | INTRAMUSCULAR | Status: AC
  Administered 2022-02-20: 2 mL via INTRADERMAL

## 2022-02-20 NOTE — Progress Notes (Signed)
SLP Cancellation Note ? ?Patient Details ?Name: Tamara Russell ?MRN: [id number] ?DOB: November 18, 1960 ? ? ?Cancelled treatment:       Reason Eval/Treat Not Completed: Patient not medically ready. No intubated. Will sign off and await new orders ? ? ?Theordore Cisnero, Riley Nearing ?02/20/2022, 9:03 AM ?

## 2022-02-20 NOTE — Progress Notes (Signed)
Pt transported on vent from 2H08 to IR and back without any complications. RN at bedside, RT will continue to monitor.  ?

## 2022-02-20 NOTE — Progress Notes (Signed)
Initial Nutrition Assessment ? ?DOCUMENTATION CODES:  ? ?Obesity unspecified ? ?INTERVENTION:  ? ?Tube Feeding via Cortrak: ?Vital AF 1.2 at 55 ml/hr ?Begin TF at 20 ml/hr; titrate by 10 mL q 8 hours until goal rate of 55 ml/hr ?This provides 1584 kcals, 99 g of protein, 1003 mL of free water ? ?If pt continues without BM post initiation of TF, recommend increasing bowel regimen ? ?NUTRITION DIAGNOSIS:  ? ?Inadequate oral intake related to acute illness as evidenced by NPO status. ? ?GOAL:  ? ?Patient will meet greater than or equal to 90% of their needs ? ?MONITOR:  ? ?Vent status, Labs, Weight trends, TF tolerance ? ?REASON FOR ASSESSMENT:  ? ?Ventilator, Other (Comment) (Cortrak Consult) ?  ? ?ASSESSMENT:  ? ?61 yo female admitted with acute psychosis/delerium, developed acute on chronic respiratory failure and required transfer to ICU and intubation.  PMH includes HTN, GERD, hx of COVID-19 infection ? ?4/25 Admitted ?5/07 Intubated ?5/08 IR for LP ? ?Pt remains on vent support, noted plan for LP today ?Cortrak placed today ? ?Unable to obtain diet and weight history from patient at this time ?Recorded po intake prior to intubation, 25-75% of meals.  ? ?First weight from this admission was taken on 5/07 ? ?+Constipation; last BM 4/30-bowel regimen in place ? ?Hypernatremic; pt started on D5 at 50 ml/hr ? ?Labs: sodium 149 (H), Creatinine wdl, CL wdl ?Meds: thiamine, colace, miralax ? ?NUTRITION - FOCUSED PHYSICAL EXAM: ? ?Flowsheet Row Most Recent Value  ?Orbital Region No depletion  ?Upper Arm Region No depletion  ?Thoracic and Lumbar Region No depletion  ?Buccal Region Unable to assess  ?Temple Region No depletion  ?Clavicle Bone Region No depletion  ?Clavicle and Acromion Bone Region No depletion  ?Scapular Bone Region No depletion  ?Dorsal Hand Unable to assess  ?Patellar Region Unable to assess  ?Anterior Thigh Region Unable to assess  ?Posterior Calf Region Unable to assess  ?Edema (RD Assessment) Moderate   ? ?  ? ?Diet Order:   ?Diet Order   ? ?       ?  Diet NPO time specified  Diet effective now       ?  ? ?  ?  ? ?  ? ? ?EDUCATION NEEDS:  ? ?Not appropriate for education at this time ? ?Skin:  Skin Assessment: Reviewed RN Assessment ? ?Last BM:  4/30 ? ?Height:  ? ?Ht Readings from Last 1 Encounters:  ?02/18/22 5\' 1"  (1.549 m)  ? ? ?Weight:  ? ?Wt Readings from Last 1 Encounters:  ?02/19/22 97.5 kg  ? ? ? ?BMI:  Body mass index is 40.62 kg/m?. ? ?Estimated Nutritional Needs:  ? ?Kcal:  1500-1700 kals ? ?Protein:  85-105g ? ?Fluid:  >/= 1.5 L ? ?04/21/22 MS, RDN, LDN, CNSC ?Registered Dietitian III ?Clinical Nutrition ?RD Pager and On-Call Pager Number Located in [place]  ? ?

## 2022-02-20 NOTE — Progress Notes (Signed)
? ? ?NAME:  Tamara Russell, MRN:  [id number], DOB:  May 12, 1961, LOS: 13 ?ADMISSION DATE:  02/06/2022, CONSULTATION DATE:  02/18/22 ?REFERRING MD:  DR Jerral Ralph, CHIEF COMPLAINT:  acute respiratory failure  ? ?History of Present Illness:  ?61 year old woman with a history of obesity, OSA (untreated), hypertension, former tobacco use who was admitted on 02/06/2022 with encephalopathy and altered mental status.  She had been admitted earlier in April for same, found to be hypoxemic and treated empirically for possible urinary tract infection.  She never really returned to baseline.  Complicated subsequent course characterized by encephalopathy and evolving paranoia with agitated delirium.  Has received multiple psychotropic medications, potentially sedating medications and has required BiPAP for associated hypoxic and hypercapnic respiratory failure.  On at least 1 occasion she had an aspiration event, was noted to have bibasilar opacities on chest x-ray and Ancef was started for presumed aspiration.  Neurology has seen the patient, question possible autoimmune process and hoping for LP to be done under sedation by interventional radiology.  She has had trial of Zyprexa, Geodon, Haldol plus Ativan, Depakote.  Brain imaging reassuring, EEG reassuring. ?On 5/6 she experienced evolving lethargy.  ABG confirmed a significant respiratory acidosis, PCO2 93.  She will moved to the ICU for BiPAP and further care. ? ?Pertinent  Medical History  ? ?Past Medical History:  ?Diagnosis Date  ? Acid reflux   ? Complication of anesthesia   ? slow to wake up  ? History of COVID-19   ? Hypertension   ? ? ?Significant Hospital Events: ?Including procedures, antibiotic start and stop dates in addition to other pertinent events   ?5/7 intubated, eeg neg neuro consulted.  ? ?Interim History / Subjective:  ?Sedated. Awaiting LP  ? ?Objective   ?Blood pressure 118/68, pulse 75, temperature (Abnormal) 101.4 ?F (38.6 ?C), temperature source  Axillary, resp. rate (Abnormal) 21, height 5\' 1"  (1.549 m), weight 97.5 kg, SpO2 92 %. ?   ?Vent Mode: PRVC ?FiO2 (%):  [22 %-60 %] 50 % ?Set Rate:  [20 bmp] 20 bmp ?Vt Set:  [330 mL] 330 mL ?PEEP:  [5 cmH20] 5 cmH20 ?Plateau Pressure:  [17 cmH20-20 cmH20] 18 cmH20  ? ?Intake/Output Summary (Last 24 hours) at 02/20/2022 0923 ?Last data filed at 02/19/2022 2200 ?Gross per 24 hour  ?Intake 383.78 ml  ?Output no documentation  ?Net 383.78 ml  ? ?Filed Weights  ? 02/19/22 1121  ?Weight: 97.5 kg  ? ? ?Examination: ? ?General 61 year old female sedated on vent  ?HENT NCAT orally intubated ?Pulm dec bases ?Card rrr ?Abd soft ?Neuro sedated ?Ext no sig edema  ? ?Resolved Hospital Problem list   ? ? ?Assessment & Plan:  ?Principal Problem: ?  Acute metabolic encephalopathy ?Active Problems: ?  Acute respiratory failure with hypoxia and hypercapnia (HCC) ?  Hypertension ?  PUD (peptic ulcer disease) ?  Elevated troponin ?  History of UTI ?  Hypokalemia ?  Class 3 severe obesity due to excess calories with serious comorbidity and body mass index (BMI) of 40.0 to 44.9 in adult [facility]) ?  Microcytosis ?  Diastolic dysfunction ? ? ? ? ?Acute on chronic hypercapneic resp failure- underlying untreated OSA, prolonged hospital course, delirium leading to inability to blow off CO2- resolved with vent ?Plan ?Cont full vent support w/ daily assessment for SBT ?VAP bundle  ?PAD protocol  ? ?Unusual psychosis/delirium/encephalopathy picture being follow by both psychiatry and neurology ?Plan ?LP today ->neuro wondering about autoimmune mediated process ?Holding  off on steroids unless this were to be ruled in as steroids would otherwise worsen delirium ?serial neuro checks ?Cont depakote  ? ?Fluid and electrolyte imbalance: hypernatremia  ?Plan ?Free water replacement start D5W at 60 cc/hr  ?Am chem  ? ?Muscular deconditioning ?Plan ?Supportive care ? ?Dysphagia  ?Plan ?PANDA today  ? ?HTN ?Plan ?Holding rx ? ?GERD ?Plan ?PPI  ? ?Best Practice  (right click and "Reselect all SmartList Selections" daily)  ? ?Diet/type: NPO ?DVT prophylaxis: SCD ?GI prophylaxis: N/A ?Lines: N/A ?Foley:  N/A ?Code Status:  full code ?Last date of multidisciplinary goals of care discussion [Pending] ?Family: Husband updated at bedside 5/7 ?My cct 31 min  ?Simonne Martinet ACNP-BC ?[facility] ?Pager # [phone] OR # [phone] if no answer ? ? ?

## 2022-02-20 NOTE — Progress Notes (Signed)
PT Cancellation Note ? ?Patient Details ?Name: Tamara Russell ?MRN: [id number] ?DOB: [birth year] ? ? ?Cancelled Treatment:    Reason Eval/Treat Not Completed: Medical issues which prohibited therapy (pt now intubated) ? ?Lillia Pauls, PT, DPT ?[facility] Services ?Pager [phone] ?Office [phone] ? ? ? ?Tamara Russell ?02/20/2022, 7:50 AM ?

## 2022-02-20 NOTE — Procedures (Signed)
Interventional Radiology Procedure Note: ? ?Lumbar puncture performed in fluoro while patient sedated on vent with assistance from RN and RT.  ?Technically successful LP at L4-L5 with OP of 17 mm, 9 mLs clear fluid removed and sent to lab for further testing.  ?No complications.  ?EBL 0 mL. ? ?Loyce Dys, MS RD PA-C ? ? ?

## 2022-02-20 NOTE — Progress Notes (Addendum)
CSW acknowledges consult for possible SNF placement for patient at time of discharge.Due to patients current orientation CSW called patients spouse and LVM . CSW awaiting callback.Patient currently on vent, has cortrack, and psych is following. CSW will continue to follow and assist with patients dc planning needs. ?

## 2022-02-20 NOTE — Procedures (Signed)
Cortrak  Person Inserting Tube:  Randa Riss L, RD Tube Type:  Cortrak - 43 inches Tube Size:  10 Tube Location:  Left nare Secured by: Bridle Technique Used to Measure Tube Placement:  Marking at nare/corner of mouth Cortrak Secured At:  60 cm   Cortrak Tube Team Note:  Consult received to place a Cortrak feeding tube.   X-ray is required, abdominal x-ray has been ordered by the Cortrak team. Please confirm tube placement before using the Cortrak tube.   If the tube becomes dislodged please keep the tube and contact the Cortrak team at [email or web address] (password [email or web address]) for replacement.  If after hours and replacement cannot be delayed, place a NG tube and confirm placement with an abdominal x-ray.    Davidjames Blansett RD, LDN Clinical Dietitian See AMiON for contact information.    

## 2022-02-20 NOTE — Progress Notes (Signed)
NEUROLOGY CONSULTATION PROGRESS NOTE  ? ?Date of service: Feb 20, 2022 ?Patient Name: Tamara Russell ?MRN:  [id number] ?DOB:  03/09/1961 ? ?Brief HPI  ?Tamara Russell is a 61 y.o. female with PMH significant for  has a past medical history of Acid reflux, Complication of anesthesia, History of COVID-19, and Hypertension. who presents to [facility] on 04/24 with acute encephalopathy with associated hallucination and paranoia complicated by hypercarbic respiratory failure status post intubation with mechanical ventilation. ?  ?Interval Hx  ? ?Patient resting in bed sedated and on MV.  ? ?Vitals  ? ?Vitals:  ? 02/20/22 0414 02/20/22 0500 02/20/22 0600 02/20/22 0811  ?BP:  123/71 122/68 118/68  ?Pulse:  74 75   ?Resp:  (!) 21 (!) 21   ?Temp: (!) 100.5 ?F (38.1 ?C)   (!) 101.4 ?F (38.6 ?C)  ?TempSrc: Axillary   Axillary  ?SpO2:  94% 92%   ?Weight:      ?Height:      ?  ? ?Body mass index is 40.62 kg/m?. ? ?Physical Exam  ? ?General: Laying in bed, no apparent distress, RASS -2 ?HENT: ET tube in place ?Abdomen: Soft to touch, non-tender.  ?Ext: No cyanosis, edema, or deformity  ?Skin: No rash. Normal palpation of skin.   ?Musculoskeletal: Normal digits and nails by inspection. No clubbing.  ? ?Neurologic Examination  ?Mental status/Cognition: sedated, RASS -2 ?Speech/language: unable to perform ?Cranial nerves:  ? CN II Pupils equal and reactive to light,   ? CN III,IV,VI No gaze deviation  ? CN V Unable to perform  ? CN VII no asymmetry, no nasolabial fold flattening   ? CN VIII Unable to perform  ? CN IX & X Unable to perform  ? CN XI Positive gag reflex  ? CN XII Unable to perform   ? ?Motor:  ?Muscle bulk: normal, tone normal, tremor none ?Unable to perform remaining strength exam ? ?Sensation: ?Unable to perform  ? ?Coordination/Complex Motor:  ?Unable to perform ? ?Labs  ? ?Basic Metabolic Panel:  ?Lab Results  ?Component Value Date  ? NA 149 (H) 02/20/2022  ? K 3.9 02/20/2022  ? CO2 35 (H) 02/20/2022  ? GLUCOSE 108  (H) 02/20/2022  ? BUN 22 02/20/2022  ? CREATININE 0.76 02/20/2022  ? CALCIUM 9.5 02/20/2022  ? GFRNONAA >60 02/20/2022  ? GFRAA >60 12/03/2019  ? ?HbA1c:  ?Lab Results  ?Component Value Date  ? HGBA1C 5.6 04/21/2016  ? ?LDL:  ?Lab Results  ?Component Value Date  ? LDLCALC 80 02/02/2022  ? ?Urine Drug Screen:  ?   ?Component Value Date/Time  ? LABOPIA NONE DETECTED 02/06/2022 1425  ? COCAINSCRNUR NONE DETECTED 02/06/2022 1425  ? LABBENZ NONE DETECTED 02/06/2022 1425  ? AMPHETMU NONE DETECTED 02/06/2022 1425  ? THCU NONE DETECTED 02/06/2022 1425  ? LABBARB NONE DETECTED 02/06/2022 1425  ?  ?Alcohol Level  ?   ?Component Value Date/Time  ? ETH <10 02/11/2022 0052  ? ?No results found for: PHENYTOIN, ZONISAMIDE, LAMOTRIGINE, LEVETIRACETA ?Lab Results  ?Component Value Date  ? VALPROATE 39 (L) 02/18/2022  ? ? ?Imaging and Diagnostic studies  ?Results for orders placed during the hospital encounter of 02/06/22 ? ?MR BRAIN WO CONTRAST ? ?Narrative ?CLINICAL DATA:  Altered mental status ? ?EXAM: ?MRI HEAD WITHOUT CONTRAST ? ?TECHNIQUE: ?Multiplanar, multiecho pulse sequences of the brain and surrounding ?structures were obtained without intravenous contrast. ? ?COMPARISON:  CT head 02/06/2022 ? ?FINDINGS: ?Brain: There is no acute intracranial hemorrhage, extra-axial fluid ?  collection, or acute infarct. ? ?Parenchymal volume is normal. The ventricles are normal in size. ?Gray-white differentiation is preserved. Scattered small foci of ?FLAIR signal abnormality throughout the subcortical and ?periventricular white matter are nonspecific but likely reflects ?sequela of chronic white matter microangiopathy. ? ?There is no suspicious parenchymal signal abnormality. There is no ?mass lesion. There is no mass effect or midline shift. ? ?Vascular: Normal flow voids. ? ?Skull and upper cervical spine: Normal marrow signal. ? ?Sinuses/Orbits: The paranasal sinuses are clear. The globes and ?orbits are unremarkable. ? ?Other:  None. ? ?IMPRESSION: ?No acute intracranial pathology. ? ? ?Electronically Signed ?By: Lesia Hausen M.D. ?On: 02/09/2022 13:01 ? ? ?Impression  ? ?Tamara Russell is a 61 y.o. female with PMH significant for  has a past medical history of Acid reflux, Complication of anesthesia, History of COVID-19, and Hypertension. who presents to [facility] on 04/24 with acute encephalopathy with associated hallucination and paranoia complicated by hypercarbic respiratory failure status post intubation with mechanical ventilation. ? ?Psychosis/delirium: ?Patient remains sedated and intubated pending LP today. Further treatment depends on results of the LP. Specifically, the need for steroids or not. Will likely ween sedation subsequently and reassess neuro exam.  ? ? ?Recommendations  ? ?Psychosis/delirium: ?- LP today ?- Will plan for steroids depending on the results ?- Reevaluate clinically once extubated.  ?______________________________________________________________________ ? ? ?Thank you for the opportunity to take part in the care of this patient. If you have any further questions, please contact the neurology consultation attending. ? ?Signed, ? ?Chari Manning, D.O.  ?Internal Medicine Resident, PGY-3 ?[name]  ?4:02 PM, 02/20/2022  ? ? ? ? ?

## 2022-02-21 ENCOUNTER — Inpatient Hospital Stay: Payer: Self-pay

## 2022-02-21 DIAGNOSIS — G9341 Metabolic encephalopathy: Secondary | ICD-10-CM | POA: Diagnosis not present

## 2022-02-21 LAB — POCT I-STAT 7, (LYTES, BLD GAS, ICA,H+H)
Acid-Base Excess: 14 mmol/L — ABNORMAL HIGH (ref 0.0–2.0)
Bicarbonate: 41.3 mmol/L — ABNORMAL HIGH (ref 20.0–28.0)
Calcium, Ion: 1.26 mmol/L (ref 1.15–1.40)
HCT: 43 % (ref 36.0–46.0)
Hemoglobin: 14.6 g/dL (ref 12.0–15.0)
O2 Saturation: 96 %
Patient temperature: 100.7
Potassium: 3.6 mmol/L (ref 3.5–5.1)
Sodium: 146 mmol/L — ABNORMAL HIGH (ref 135–145)
TCO2: 43 mmol/L — ABNORMAL HIGH (ref 22–32)
pCO2 arterial: 66.6 mmHg (ref 32–48)
pH, Arterial: 7.406 (ref 7.35–7.45)
pO2, Arterial: 93 mmHg (ref 83–108)

## 2022-02-21 LAB — PHOSPHORUS
Phosphorus: 2.5 mg/dL (ref 2.5–4.6)
Phosphorus: 3.6 mg/dL (ref 2.5–4.6)

## 2022-02-21 LAB — CBC
HCT: 45.7 % (ref 36.0–46.0)
Hemoglobin: 13.6 g/dL (ref 12.0–15.0)
MCH: 23.3 pg — ABNORMAL LOW (ref 26.0–34.0)
MCHC: 29.8 g/dL — ABNORMAL LOW (ref 30.0–36.0)
MCV: 78.3 fL — ABNORMAL LOW (ref 80.0–100.0)
Platelets: 195 10*3/uL (ref 150–400)
RBC: 5.84 MIL/uL — ABNORMAL HIGH (ref 3.87–5.11)
RDW: 17.5 % — ABNORMAL HIGH (ref 11.5–15.5)
WBC: 8.2 10*3/uL (ref 4.0–10.5)
nRBC: 0 % (ref 0.0–0.2)

## 2022-02-21 LAB — BASIC METABOLIC PANEL
Anion gap: 10 (ref 5–15)
BUN: 21 mg/dL (ref 8–23)
CO2: 34 mmol/L — ABNORMAL HIGH (ref 22–32)
Calcium: 8.9 mg/dL (ref 8.9–10.3)
Chloride: 103 mmol/L (ref 98–111)
Creatinine, Ser: 0.73 mg/dL (ref 0.44–1.00)
GFR, Estimated: >60 mL/min (ref >60)
Glucose, Bld: 192 mg/dL — ABNORMAL HIGH (ref 70–99)
Potassium: 3.6 mmol/L (ref 3.5–5.1)
Sodium: 147 mmol/L — ABNORMAL HIGH (ref 135–145)

## 2022-02-21 LAB — GLUCOSE, CAPILLARY
Glucose-Capillary: 189 mg/dL — ABNORMAL HIGH (ref 70–99)
Glucose-Capillary: 188 mg/dL — ABNORMAL HIGH (ref 70–99)
Glucose-Capillary: 178 mg/dL — ABNORMAL HIGH (ref 70–99)
Glucose-Capillary: 137 mg/dL — ABNORMAL HIGH (ref 70–99)
Glucose-Capillary: 149 mg/dL — ABNORMAL HIGH (ref 70–99)

## 2022-02-21 LAB — PROCALCITONIN: Procalcitonin: <0.1 ng/mL

## 2022-02-21 LAB — MAGNESIUM
Magnesium: 2.1 mg/dL (ref 1.7–2.4)
Magnesium: 2.2 mg/dL (ref 1.7–2.4)

## 2022-02-21 MED ORDER — FENTANYL CITRATE PF 50 MCG/ML IJ SOSY: 12.5000 ug | PREFILLED_SYRINGE | INTRAMUSCULAR | Status: DC | PRN

## 2022-02-21 MED ORDER — SODIUM CHLORIDE 0.9% FLUSH
10.0000 mL | Freq: Two times a day (BID) | INTRAVENOUS | Status: DC
  Administered 2022-02-21 – 2022-02-25 (×8): 10 mL
  Administered 2022-02-25: 20 mL
  Administered 2022-02-26: 10 mL
  Administered 2022-02-26 – 2022-02-27 (×2): 20 mL
  Administered 2022-02-28 – 2022-03-07 (×11): 10 mL
  Administered 2022-03-07: 30 mL
  Administered 2022-03-08 – 2022-03-09 (×4): 10 mL
  Administered 2022-03-10: 30 mL
  Administered 2022-03-10: 10 mL
  Administered 2022-03-11: 30 mL
  Administered 2022-03-11: 10 mL
  Administered 2022-03-12 (×2): 30 mL
  Administered 2022-03-13 – 2022-03-17 (×9): 10 mL

## 2022-02-21 MED ORDER — PHENYLEPHRINE HCL-NACL 20-0.9 MG/250ML-% IV SOLN: 0.0000 ug/min | INTRAVENOUS | Status: DC

## 2022-02-21 MED ORDER — SODIUM CHLORIDE 0.9% FLUSH
10.0000 mL | INTRAVENOUS | Status: DC | PRN
  Administered 2022-02-21 – 2022-03-04 (×2): 10 mL

## 2022-02-21 MED ORDER — LORAZEPAM 2 MG/ML IJ SOLN
2.0000 mg | INTRAMUSCULAR | Status: DC | PRN
  Administered 2022-02-21 – 2022-02-22 (×2): 2 mg via INTRAVENOUS
  Filled 2022-02-21 (×3): qty 1

## 2022-02-21 MED ORDER — PHENYLEPHRINE HCL-NACL 20-0.9 MG/250ML-% IV SOLN
25.0000 ug/min | INTRAVENOUS | Status: DC
  Administered 2022-02-21 – 2022-02-25 (×2): 25 ug/min via INTRAVENOUS
  Filled 2022-02-21 (×3): qty 250

## 2022-02-21 MED ORDER — QUETIAPINE FUMARATE 100 MG PO TABS
100.0000 mg | ORAL_TABLET | Freq: Two times a day (BID) | ORAL | Status: DC
  Administered 2022-02-21 – 2022-02-22 (×3): 100 mg
  Filled 2022-02-21 (×3): qty 1

## 2022-02-21 MED ORDER — SODIUM CHLORIDE 0.9 % IV SOLN
250.0000 mL | INTRAVENOUS | Status: DC
Start: 2022-02-21 — End: 2022-03-17
  Administered 2022-02-25 – 2022-02-27 (×2): 250 mL via INTRAVENOUS

## 2022-02-21 NOTE — Procedures (Signed)
Extubation Procedure Note ? ?Patient Details:   ?Name: Tamara Russell ?DOB: [birth year] ?MRN: [id number] ?  ?Airway Documentation:  ?  ?Vent end date: 02/21/22 Vent end time: 0918  ? ?Evaluation ? O2 sats: stable throughout ?Complications: No apparent complications ?Patient did tolerate procedure well. ?Bilateral Breath Sounds: Clear ?  ?Yes ? ?Patient was extubated to a 8L [place]. Dr. Katrinka Blazing notified of patient not having a cuff leak. Verbal orders were given to proceed with extubation.  ? ?Carlynn Spry ?02/21/2022, 09:18 AM ? ?

## 2022-02-21 NOTE — Progress Notes (Addendum)
NEUROLOGY CONSULTATION PROGRESS NOTE  ? ?Date of service: Feb 21, 2022 ?Patient Name: Tamara Russell ?MRN:  [id number] ?DOB:  1960-11-10 ? ?Brief HPI  ?Tamara Russell is a 61 y.o. female with PMH significant for  has a past medical history of Acid reflux, Complication of anesthesia, History of COVID-19, and Hypertension. who presents to [facility] on 04/24 with acute encephalopathy with associated hallucination and paranoia complicated by hypercarbic respiratory failure status post intubation with mechanical ventilation and lumbar puncture ?  ?Interval Hx  ?Patient evaluated after weaning sedation. She is alert and apparently anxious. She is diaphoretic and shaking diffusely She responds to physical and verbal stimuli. She is able to follow basic commands. She is non-rigid or hyperreflexic.   ? ?Vitals  ? ?Vitals:  ? 02/21/22 0600 02/21/22 0700 02/21/22 0800 02/21/22 0806  ?BP: (!) 85/56 (!) 84/56 (!) 83/53   ?Pulse: 68 69 69 69  ?Resp: 20 20 20    ?Temp:  (!) 101.7 ?F (38.7 ?C)    ?TempSrc:      ?SpO2: 97% 97% 98% 100%  ?Weight:      ?Height:      ?  ? ?Body mass index is 40.61 kg/m?. ? ?Physical Exam  ? ?General: Laying in bed, and apparently anxious, diaphoretic ?HENT: ET tube in place ?Abdomen: Soft to touch, non-tender.  ?Ext: No cyanosis, edema, or deformity  ?Skin: No rash. Normal palpation of skin.   ?Musculoskeletal: Normal digits and nails by inspection. No clubbing.  ? ?Neurologic Examination  ?Mental status/Cognition: Alert and able to follow basic commands but intubated. ?Speech/language: unable to perform ?Cranial nerves: grossly intact ?Motor: Able to move all 4 extremities spontaneously, she is diffusely tremulous without ridgidity or hyperreflexia  ?Sensation: responds to physical stimuli ?Coordination/Complex Motor: Unable to perform ? ?Labs  ? ?Basic Metabolic Panel:  ?Lab Results  ?Component Value Date  ? NA 147 (H) 02/21/2022  ? K 3.6 02/21/2022  ? CO2 34 (H) 02/21/2022  ? GLUCOSE 192 (H)  02/21/2022  ? BUN 21 02/21/2022  ? CREATININE 0.73 02/21/2022  ? CALCIUM 8.9 02/21/2022  ? GFRNONAA >60 02/21/2022  ? GFRAA >60 12/03/2019  ? ?HbA1c:  ?Lab Results  ?Component Value Date  ? HGBA1C 5.6 04/21/2016  ? ?LDL:  ?Lab Results  ?Component Value Date  ? LDLCALC 80 02/02/2022  ? ?Urine Drug Screen:  ?   ?Component Value Date/Time  ? LABOPIA NONE DETECTED 02/06/2022 1425  ? COCAINSCRNUR NONE DETECTED 02/06/2022 1425  ? LABBENZ NONE DETECTED 02/06/2022 1425  ? AMPHETMU NONE DETECTED 02/06/2022 1425  ? THCU NONE DETECTED 02/06/2022 1425  ? LABBARB NONE DETECTED 02/06/2022 1425  ?  ?Alcohol Level  ?   ?Component Value Date/Time  ? ETH <10 02/11/2022 0052  ? ?No results found for: PHENYTOIN, ZONISAMIDE, LAMOTRIGINE, LEVETIRACETA ?Lab Results  ?Component Value Date  ? VALPROATE 39 (L) 02/18/2022  ? ? ?CSF Studies: ?Lab Results  ?Component Value Date  ? GLUCCSF 73 (H) 02/20/2022  ? PROTEINCSF 33 02/20/2022  ? ?Lab Results  ?Component Value Date  ? TUBENUMBER 3 02/20/2022  ? COLORCSF COLORLESS 02/20/2022  ? APPEARCSF CLEAR (A) 02/20/2022  ? SUPERNATCSF NOT INDICATED 02/20/2022  ? RBCCOUNTCSF 24 (H) 02/20/2022  ? WBCCSF 1 02/20/2022  ? ?Lab Results  ?Component Value Date  ? SDES CSF 02/20/2022  ? SPECREQUEST NONE 02/20/2022  ? GRAMSTAIN  02/20/2022  ?  WBC PRESENT, PREDOMINANTLY MONONUCLEAR ?NO ORGANISMS SEEN ?CYTOSPIN SMEAR ?Performed at [facility], [address] [place], [place] [address] ?  ? CULT PENDING 02/20/2022  ? REPTSTATUS PENDING 02/20/2022  ? ? ?Imaging and Diagnostic studies  ?Results for orders placed during the hospital encounter of 02/06/22 ? ?MR BRAIN WO CONTRAST ? ?Narrative ?CLINICAL DATA:  Altered mental status ? ?EXAM: ?MRI HEAD WITHOUT CONTRAST ? ?TECHNIQUE: ?Multiplanar, multiecho pulse sequences of the brain and surrounding ?structures were obtained without intravenous contrast. ? ?COMPARISON:  CT head 02/06/2022 ? ?FINDINGS: ?Brain: There is no acute intracranial hemorrhage,  extra-axial fluid ?collection, or acute infarct. ? ?Parenchymal volume is normal. The ventricles are normal in size. ?Gray-white differentiation is preserved. Scattered small foci of ?FLAIR signal abnormality throughout the subcortical and ?periventricular white matter are nonspecific but likely reflects ?sequela of chronic white matter microangiopathy. ? ?There is no suspicious parenchymal signal abnormality. There is no ?mass lesion. There is no mass effect or midline shift. ? ?Vascular: Normal flow voids. ? ?Skull and upper cervical spine: Normal marrow signal. ? ?Sinuses/Orbits: The paranasal sinuses are clear. The globes and ?orbits are unremarkable. ? ?Other: None. ? ?IMPRESSION: ?No acute intracranial pathology. ? ? ?Electronically Signed ?By: Lesia Hausen M.D. ?On: 02/09/2022 13:01 ? ? ? ?Impression  ? ?Tamara Russell is a 61 y.o. female with PMH significant for  has a past medical history of Acid reflux, Complication of anesthesia, History of COVID-19, and Hypertension. who presents to [facility] on 04/24 with acute encephalopathy with associated hallucination and paranoia complicated by hypercarbic respiratory failure status post intubation with mechanical ventilation and lumbar puncture.  ? ?Psychosis/delirium: ?Patient is s/p LP without lymphocytic pleocytosis.Still too early to tell if this is 2/2 to autoimmune encephalitis. CSF serology have not resulted. Given patient is still sedated and intubated, will hold off on starting  steroids. Depending on result of LP, will start steroids versus continue with antipsychotic medications. Additionally will likely need further evaluation for a primary malignancy due to concern for malignancy associated paraneoplastic syndrome.  ? ? ?Recommendations  ? ?Psychosis/delirium: ?- LP results without pleocytosis, waiting on serologies.  ?- Will hold off steroids for now ?- Weaning sedation and plan for extubation in near future ?- May need to restart antipsychotic therapy  depending on LP serologies.  ?- Will need CT chest/abd/pelvis w/ wo contrast for primary malignancy work up  ? ? ?______________________________________________________________________ ? ? ?Thank you for the opportunity to take part in the care of this patient. If you have any further questions, please contact the neurology consultation attending. ? ?Signed, ? ?Chari Manning, D.O.  ?Internal Medicine Resident, PGY-3 ?[name]  ?8:24 AM, 02/21/2022  ? ? ? ? ?

## 2022-02-21 NOTE — Progress Notes (Addendum)
? ? ?NAME:  Tamara Russell, MRN:  [id number], DOB:  03-09-1961, LOS: 14 ?ADMISSION DATE:  02/06/2022, CONSULTATION DATE:  02/18/22 ?REFERRING MD:  DR Jerral Ralph, CHIEF COMPLAINT:  acute respiratory failure  ? ?History of Present Illness:  ?61 year old woman with a history of obesity, OSA (untreated), hypertension, former tobacco use who was admitted on 02/06/2022 with encephalopathy and altered mental status.  She had been admitted earlier in April for same, found to be hypoxemic and treated empirically for possible urinary tract infection.  She never really returned to baseline.  Complicated subsequent course characterized by encephalopathy and evolving paranoia with agitated delirium.  Has received multiple psychotropic medications, potentially sedating medications and has required BiPAP for associated hypoxic and hypercapnic respiratory failure.  On at least 1 occasion she had an aspiration event, was noted to have bibasilar opacities on chest x-ray and Ancef was started for presumed aspiration.  Neurology has seen the patient, question possible autoimmune process and hoping for LP to be done under sedation by interventional radiology.  She has had trial of Zyprexa, Geodon, Haldol plus Ativan, Depakote.  Brain imaging reassuring, EEG reassuring. ?On 5/6 she experienced evolving lethargy.  ABG confirmed a significant respiratory acidosis, PCO2 93.  She will moved to the ICU for BiPAP and further care. ? ?Pertinent  Medical History  ? ?Past Medical History:  ?Diagnosis Date  ? Acid reflux   ? Complication of anesthesia   ? slow to wake up  ? History of COVID-19   ? Hypertension   ? ? ?Significant Hospital Events: ?Including procedures, antibiotic start and stop dates in addition to other pertinent events   ?5/7 intubated, eeg neg neuro consulted.  ?5/8 LP completed. Cor trak placed.  ?5/9 working on weaning sedation. Had some sedation induced hypotension ? ?Interim History / Subjective:  ?RASS -4 currently   ? ?Objective   ?Blood pressure (Abnormal) 83/53, pulse 69, temperature (Abnormal) 101.7 ?F (38.7 ?C), resp. rate 20, height 5\' 1"  (1.549 m), weight 97.5 kg, SpO2 98 %. ?   ?Vent Mode: PRVC ?FiO2 (%):  [40 %-50 %] 40 % ?Set Rate:  [20 bmp] 20 bmp ?Vt Set:  [330 mL] 330 mL ?PEEP:  [5 cmH20] 5 cmH20 ?Plateau Pressure:  [16 cmH20-18 cmH20] 16 cmH20  ? ?Intake/Output Summary (Last 24 hours) at 02/21/2022 0803 ?Last data filed at 02/21/2022 0300 ?Gross per 24 hour  ?Intake 1821.29 ml  ?Output 200 ml  ?Net 1621.29 ml  ? ?Filed Weights  ? 02/19/22 1121 02/21/22 0500  ?Weight: 97.5 kg 97.5 kg  ? ? ?Examination: ?General sedated on vent ?HENT NCAT no JVD PERRL ?Pulm clear. Dec bases. VTs 300s on PSV attempt at 15 ?Card rrr ?Abd soft  ?Ext warm no sig edema ?Neuro heavily sedated. I can get her to cough w/ sxn and that's pretty much it  ?GU cl yellow  ? ?Resolved Hospital Problem list   ? ? ?Assessment & Plan:  ?Principal Problem: ?  Acute metabolic encephalopathy ?Active Problems: ?  Acute respiratory failure with hypoxia and hypercapnia (HCC) ?  Hypertension ?  PUD (peptic ulcer disease) ?  Elevated troponin ?  History of UTI ?  Hypokalemia ?  Class 3 severe obesity due to excess calories with serious comorbidity and body mass index (BMI) of 40.0 to 44.9 in adult [facility]) ?  Microcytosis ?  Diastolic dysfunction ? ? ? ? ?Acute on chronic hypercapneic resp failure- underlying untreated OSA, prolonged hospital course, delirium leading to inability to blow off  CO2- resolved with vent ?Plan ?Dc fent; minimize sedation  ?Daily assessment for diuresis  ?Attempt PSV/SBT today when more awake.  ?I suspect will need minimally CPAP at HS and perhaps BIPAP.  ?VAP bundle  ? ?Unusual psychosis/delirium/encephalopathy picture being follow by both psychiatry and neurology ?Plan ?F/u  LP eval as directed by neuro.  ?Dc fent gtt ?Cont precedex ?Off depakote d/t levels. Neuro and psych considering other options now that we have NGT  ?Holding off on  steroids unless CSF eval confirms autoimmune process ?Add seroquel now that she has NGT access ? ?Fever  ?Plan ?Ck PCT ? ?Drug induced Hypotension ?Plan ?Minimize sedation  ?May need neo; low threshold to add.  ? ?Fluid and electrolyte imbalance: hypernatremia  ?Plan ?Cont current free water replacement (Na slowly improving) w/ D5w at 60 ? ? ?Muscular deconditioning ?Plan ?Supportive care ? ?Dysphagia  ?Plan ?tubefeeds ? ?HTN ?Plan ?Holding as has hypotension  ? ?GERD ?Plan ?PPI  ? ?Best Practice (right click and "Reselect all SmartList Selections" daily)  ? ?Diet/type: NPO ?DVT prophylaxis: SCD->resume Byron heparin 5/10  ?GI prophylaxis: N/A ?Lines: N/A ?Foley:  N/A ?Code Status:  full code ?Last date of multidisciplinary goals of care discussion [Pending] ?Family: Husband updated at bedside 5/7 ?My cct 31 min  ?Simonne Martinet ACNP-BC ?[facility] ?Pager # [phone] OR # [phone] if no answer ? ? ?

## 2022-02-21 NOTE — Progress Notes (Signed)
Pt placed on BiPAP by RT.pt tolerating well at this time, RN at bedside, RT will continue to monitor.  ?

## 2022-02-21 NOTE — Progress Notes (Signed)
PT Cancellation Note ? ?Patient Details ?Name: Tamara Russell ?MRN: [id number] ?DOB: April 27, 1961 ? ? ?Cancelled Treatment:    Reason Eval/Treat Not Completed: Patient not medically ready (remains intubated) ? ?Lillia Pauls, PT, DPT ?[facility] Services ?Pager [phone] ?Office [phone] ? ? ? ?Tamara Russell ?02/21/2022, 7:26 AM ?

## 2022-02-21 NOTE — Progress Notes (Signed)
Peripherally Inserted Central Catheter Placement ? ?The IV Nurse has discussed with the patient and/or persons authorized to consent for the patient, the purpose of this procedure and the potential benefits and risks involved with this procedure.  The benefits include less needle sticks, lab draws from the catheter, and the patient may be discharged home with the catheter. Risks include, but not limited to, infection, bleeding, blood clot (thrombus formation), and puncture of an artery; nerve damage and irregular heartbeat and possibility to perform a PICC exchange if needed/ordered by physician.  Alternatives to this procedure were also discussed.  Bard Power PICC patient education guide, fact sheet on infection prevention and patient information card has been provided to patient /or left at bedside.  ? ?Consent obtained with daughter at bedside  ? ?PICC Placement Documentation  ?PICC Triple Lumen 02/21/22 Right Brachial 38 cm 0 cm (Active)  ?Indication for Insertion or Continuance of Line Prolonged intravenous therapies 02/21/22 1500  ?Exposed Catheter (cm) 0 cm 02/21/22 1500  ?Site Assessment Clean, Dry, Intact 02/21/22 1500  ?Lumen #1 Status Saline locked;Flushed;Blood return noted 02/21/22 1500  ?Lumen #2 Status Flushed;Saline locked;Blood return noted 02/21/22 1500  ?Lumen #3 Status Flushed;Saline locked;Blood return noted 02/21/22 1500  ?Dressing Type Securing device;Transparent 02/21/22 1500  ?Dressing Status Antimicrobial disc in place 02/21/22 1500  ?Safety Lock Not Applicable 02/21/22 1500  ?Line Care Connections checked and tightened 02/21/22 1500  ?Dressing Intervention New dressing 02/21/22 1500  ?Dressing Change Due 02/28/22 02/21/22 1500  ? ? ? ? ? ?Franne Grip Renee ?02/21/2022, 3:57 PM ? ?

## 2022-02-21 NOTE — Consult Note (Signed)
Brief Psychiatry Consult Note  ?Pt seen, to sedated for interview. Spoke to daughter and husband at bedside - getting quetiapine per ICU- had not been option on floor as pt refusing all PO meds. Will observe for effect over next 1-2 days prior to making changes to standing meds.  ? ?Tamara Russell ? ?

## 2022-02-22 DIAGNOSIS — G9341 Metabolic encephalopathy: Secondary | ICD-10-CM | POA: Diagnosis not present

## 2022-02-22 DIAGNOSIS — G9340 Encephalopathy, unspecified: Secondary | ICD-10-CM

## 2022-02-22 LAB — CBC
HCT: 43.1 % (ref 36.0–46.0)
Hemoglobin: 12.7 g/dL (ref 12.0–15.0)
MCH: 23.6 pg — ABNORMAL LOW (ref 26.0–34.0)
MCHC: 29.5 g/dL — ABNORMAL LOW (ref 30.0–36.0)
MCV: 80.3 fL (ref 80.0–100.0)
Platelets: 222 10*3/uL (ref 150–400)
RBC: 5.37 MIL/uL — ABNORMAL HIGH (ref 3.87–5.11)
RDW: 17.3 % — ABNORMAL HIGH (ref 11.5–15.5)
WBC: 10.1 10*3/uL (ref 4.0–10.5)
nRBC: 0 % (ref 0.0–0.2)

## 2022-02-22 LAB — GLUCOSE, CAPILLARY
Glucose-Capillary: 116 mg/dL — ABNORMAL HIGH (ref 70–99)
Glucose-Capillary: 132 mg/dL — ABNORMAL HIGH (ref 70–99)
Glucose-Capillary: 160 mg/dL — ABNORMAL HIGH (ref 70–99)
Glucose-Capillary: 132 mg/dL — ABNORMAL HIGH (ref 70–99)
Glucose-Capillary: 118 mg/dL — ABNORMAL HIGH (ref 70–99)
Glucose-Capillary: 143 mg/dL — ABNORMAL HIGH (ref 70–99)

## 2022-02-22 LAB — BASIC METABOLIC PANEL
Anion gap: 4 — ABNORMAL LOW (ref 5–15)
BUN: 22 mg/dL (ref 8–23)
CO2: 38 mmol/L — ABNORMAL HIGH (ref 22–32)
Calcium: 8.6 mg/dL — ABNORMAL LOW (ref 8.9–10.3)
Chloride: 104 mmol/L (ref 98–111)
Creatinine, Ser: 0.56 mg/dL (ref 0.44–1.00)
GFR, Estimated: >60 mL/min (ref >60)
Glucose, Bld: 133 mg/dL — ABNORMAL HIGH (ref 70–99)
Potassium: 3.6 mmol/L (ref 3.5–5.1)
Sodium: 146 mmol/L — ABNORMAL HIGH (ref 135–145)

## 2022-02-22 LAB — PHOSPHORUS: Phosphorus: 3.7 mg/dL (ref 2.5–4.6)

## 2022-02-22 LAB — AMMONIA: Ammonia: 25 umol/L (ref 9–35)

## 2022-02-22 LAB — MAGNESIUM: Magnesium: 2.2 mg/dL (ref 1.7–2.4)

## 2022-02-22 LAB — BRAIN NATRIURETIC PEPTIDE: B Natriuretic Peptide: 43.7 pg/mL (ref 0.0–100.0)

## 2022-02-22 MED ORDER — QUETIAPINE FUMARATE 100 MG PO TABS
200.0000 mg | ORAL_TABLET | Freq: Two times a day (BID) | ORAL | Status: DC
  Administered 2022-02-22 – 2022-02-23 (×2): 200 mg
  Filled 2022-02-22 (×2): qty 2

## 2022-02-22 MED ORDER — HEPARIN SODIUM (PORCINE) 5000 UNIT/ML IJ SOLN
5000.0000 [IU] | Freq: Three times a day (TID) | INTRAMUSCULAR | Status: DC
  Administered 2022-02-22 – 2022-03-02 (×23): 5000 [IU] via SUBCUTANEOUS
  Filled 2022-02-22 (×23): qty 1

## 2022-02-22 MED ORDER — LACTULOSE 10 GM/15ML PO SOLN
30.0000 g | Freq: Two times a day (BID) | ORAL | Status: DC
  Administered 2022-02-22 – 2022-02-24 (×6): 30 g
  Filled 2022-02-22 (×6): qty 45

## 2022-02-22 MED ORDER — LORAZEPAM 2 MG/ML IJ SOLN: 0.5000 mg | INTRAMUSCULAR | Status: DC | PRN

## 2022-02-22 NOTE — Progress Notes (Signed)
NEUROLOGY CONSULTATION PROGRESS NOTE  ? ?Date of service: Feb 22, 2022 ?Patient Name: Tamara Russell ?MRN:  [id number] ?DOB:  07-26-1961 ? ?Brief HPI  ?Tamara Russell is a 61 y.o. female with PMH significant for  has a past medical history of Acid reflux, Complication of anesthesia, History of COVID-19, and Hypertension. who presents to [facility] on 04/24 with acute encephalopathy with associated hallucination and paranoia complicated by hypercarbic respiratory failure with short course of MV and s/p lumbar puncture. ?  ?Interval Hx  ? ?Patient was successfully extubated yesterday.  She continues to have thick secretions and has periods of agitation but otherwise has tolerated extubation well.  Plan was to start Seroquel overnight however this has not been initiated yet. ? ?On evaluation today, patient resting in bed and appears sedated and disoriented.  Unable to participate with interview. ? ? ?Vitals  ? ?Vitals:  ? 02/22/22 0500 02/22/22 0600 02/22/22 0700 02/22/22 0759  ?BP: 126/70 115/69    ?Pulse: (!) 115 (!) 122 (!) 131 (!) 111  ?Resp: (!) 23 (!) 26 (!) 31 (!) 26  ?Temp:      ?TempSrc:      ?SpO2: 95% 94% 93% 93%  ?Weight:  99.3 kg    ?Height:      ?  ? ?Body mass index is 41.36 kg/m?. ? ?Physical Exam  ? ?General: Laying in bed  ?HENT: Continues to have thick secretions in oropharynx ?Ext: No cyanosis, edema, or deformity  ?Skin: No rash. Normal palpation of skin.   ?Musculoskeletal: Normal digits and nails by inspection. No clubbing.  ? ?Neurologic Examination  ?Mental status/Cognition: Sleepy and altered ?Speech/language: Patient able to speak but not responding to questions appropriately ?Cranial nerves: grossly intact ?Motor: Able to move all 4 extremities spontaneously, ?Sensation: responds to physical stimuli ?Coordination/Complex Motor: Unable to perform ? ?Labs  ? ?Basic Metabolic Panel:  ?Lab Results  ?Component Value Date  ? NA 146 (H) 02/22/2022  ? K 3.6 02/22/2022  ? CO2 38 (H) 02/22/2022  ?  GLUCOSE 133 (H) 02/22/2022  ? BUN 22 02/22/2022  ? CREATININE 0.56 02/22/2022  ? CALCIUM 8.6 (L) 02/22/2022  ? GFRNONAA >60 02/22/2022  ? GFRAA >60 12/03/2019  ? ?HbA1c:  ?Lab Results  ?Component Value Date  ? HGBA1C 5.6 04/21/2016  ? ?LDL:  ?Lab Results  ?Component Value Date  ? LDLCALC 80 02/02/2022  ? ?Urine Drug Screen:  ?   ?Component Value Date/Time  ? LABOPIA NONE DETECTED 02/06/2022 1425  ? COCAINSCRNUR NONE DETECTED 02/06/2022 1425  ? LABBENZ NONE DETECTED 02/06/2022 1425  ? AMPHETMU NONE DETECTED 02/06/2022 1425  ? THCU NONE DETECTED 02/06/2022 1425  ? LABBARB NONE DETECTED 02/06/2022 1425  ?  ?Alcohol Level  ?   ?Component Value Date/Time  ? ETH <10 02/11/2022 0052  ? ?No results found for: PHENYTOIN, ZONISAMIDE, LAMOTRIGINE, LEVETIRACETA ?Lab Results  ?Component Value Date  ? VALPROATE 39 (L) 02/18/2022  ? ? ?CSF Studies: ?Lab Results  ?Component Value Date  ? GLUCCSF 73 (H) 02/20/2022  ? PROTEINCSF 33 02/20/2022  ? ?Lab Results  ?Component Value Date  ? TUBENUMBER 3 02/20/2022  ? COLORCSF COLORLESS 02/20/2022  ? APPEARCSF CLEAR (A) 02/20/2022  ? SUPERNATCSF NOT INDICATED 02/20/2022  ? RBCCOUNTCSF 24 (H) 02/20/2022  ? WBCCSF 1 02/20/2022  ? ?Lab Results  ?Component Value Date  ? SDES CSF 02/20/2022  ? SPECREQUEST NONE 02/20/2022  ? GRAMSTAIN  02/20/2022  ?  WBC PRESENT, PREDOMINANTLY MONONUCLEAR ?NO ORGANISMS SEEN ?CYTOSPIN  SMEAR ?  ? CULT  02/20/2022  ?  NO GROWTH < 24 HOURS ?Performed at [facility], [address], [place], [place] [address] ?  ? REPTSTATUS PENDING 02/20/2022  ? ? ?Imaging and Diagnostic studies  ?Results for orders placed during the hospital encounter of 02/06/22 ? ?MR BRAIN WO CONTRAST ? ?Narrative ?CLINICAL DATA:  Altered mental status ? ?EXAM: ?MRI HEAD WITHOUT CONTRAST ? ?TECHNIQUE: ?Multiplanar, multiecho pulse sequences of the brain and surrounding ?structures were obtained without intravenous contrast. ? ?COMPARISON:  CT head 02/06/2022 ? ?FINDINGS: ?Brain: There is no  acute intracranial hemorrhage, extra-axial fluid ?collection, or acute infarct. ? ?Parenchymal volume is normal. The ventricles are normal in size. ?Gray-white differentiation is preserved. Scattered small foci of ?FLAIR signal abnormality throughout the subcortical and ?periventricular white matter are nonspecific but likely reflects ?sequela of chronic white matter microangiopathy. ? ?There is no suspicious parenchymal signal abnormality. There is no ?mass lesion. There is no mass effect or midline shift. ? ?Vascular: Normal flow voids. ? ?Skull and upper cervical spine: Normal marrow signal. ? ?Sinuses/Orbits: The paranasal sinuses are clear. The globes and ?orbits are unremarkable. ? ?Other: None. ? ?IMPRESSION: ?No acute intracranial pathology. ? ? ?Electronically Signed ?By: Lesia Hausen M.D. ?On: 02/09/2022 13:01 ? ? ? ?Impression  ? ?Tamara Russell is a 61 y.o. female with PMH significant for  has a past medical history of Acid reflux, Complication of anesthesia, History of COVID-19, and Hypertension. who presents to [facility] on 04/24 with acute encephalopathy with associated hallucination and paranoia complicated by hypercarbic respiratory failure with short course of MV and s/p lumbar puncture. ? ?Acute encephalopathy 2/2 to atypical delirium with psychosis versus autoimmune encephalitis   ?Patient is status post lumbar puncture and extubation.  She is still significantly altered from baseline.  I suspect it will take time for her agitation to improve.  Agree with a trial of Seroquel to see if this improves her agitation and mentation.  Currently waiting on LP results for autoimmune encephalitis.  Off on glucocorticoid therapy while she is more acutely agitated and altered.  We will still need to get CT chest/abdomen/pelvis for further work-up for possible primary malignancy.  We will pursue this when patient is more stable.  I discussed this with the patient's family at bedside. ? ?Recommendations   ? ?Acute encephalopathy 2/2 to atypical delirium with psychosis versus autoimmune encephalitis  ?- LP results without pleocytosis, waiting on serologies and HSV PCR ?- Will hold off steroids while acutely agitated and altered ?-Agree with a trial of Seroquel try to avoid benzodiazepines as possible. ?-Will need CT chest/abd/pelvis w/ wo contrast when patient is more stable for primary malignancy work up  ? ?______________________________________________________________________ ? ? ?Thank you for the opportunity to take part in the care of this patient. If you have any further questions, please contact the neurology consultation attending. ? ?Signed, ? ?Chari Manning, D.O.  ?Internal Medicine Resident, PGY-3 ?[name]  ?8:53 AM, 02/22/2022  ? ? ? ? ? ?

## 2022-02-22 NOTE — Progress Notes (Signed)
? ? ?NAME:  Tamara Russell, MRN:  [id number], DOB:  05-14-1961, LOS: 36 ?ADMISSION DATE:  02/06/2022, CONSULTATION DATE:  02/18/22 ?REFERRING MD:  DR Sloan Leiter, CHIEF COMPLAINT:  acute respiratory failure  ? ?History of Present Illness:  ?61 year old woman with a history of obesity, OSA (untreated), hypertension, former tobacco use who was admitted on 02/06/2022 with encephalopathy and altered mental status.  She had been admitted earlier in April for same, found to be hypoxemic and treated empirically for possible urinary tract infection.  She never really returned to baseline.  Complicated subsequent course characterized by encephalopathy and evolving paranoia with agitated delirium.  Has received multiple psychotropic medications, potentially sedating medications and has required BiPAP for associated hypoxic and hypercapnic respiratory failure.  On at least 1 occasion she had an aspiration event, was noted to have bibasilar opacities on chest x-ray and Ancef was started for presumed aspiration.  Neurology has seen the patient, question possible autoimmune process and hoping for LP to be done under sedation by interventional radiology.  She has had trial of Zyprexa, Geodon, Haldol plus Ativan, Depakote.  Brain imaging reassuring, EEG reassuring. ?On 5/6 she experienced evolving lethargy.  ABG confirmed a significant respiratory acidosis, PCO2 93.  She will moved to the ICU for BiPAP and further care. ? ?Pertinent  Medical History  ? ?Past Medical History:  ?Diagnosis Date  ? Acid reflux   ? Complication of anesthesia   ? slow to wake up  ? History of COVID-19   ? Hypertension   ? ? ?Significant Hospital Events: ?Including procedures, antibiotic start and stop dates in addition to other pertinent events   ?5/7 intubated, eeg neg neuro consulted.  ?5/8 LP completed. Cor trak placed.  ?5/9 working on weaning sedation. Had some sedation induced hypotension, extubated, having Wild swings of agitated delirium with  psychosis, and then episodes of minimal responsiveness there was some correlation between hypotension and decreased LOC.  We did add scheduled Seroquel ?5/10 continues to have wide swings where she is agitated, and uncontrollable versus sedated ? ?Interim History / Subjective:  ?Currently sedated received Ativan this morning ? ?Objective   ?Blood pressure 115/69, pulse (Abnormal) 111, temperature 98.9 ?F (37.2 ?C), temperature source Axillary, resp. rate (Abnormal) 26, height 5\' 1"  (1.549 m), weight 99.3 kg, SpO2 93 %. ?CVP:  [7 mmHg-12 mmHg] 12 mmHg  ?Vent Mode: BIPAP;PCV ?FiO2 (%):  [40 %] 40 % ?Set Rate:  [12 bmp-20 bmp] 20 bmp ?PEEP:  [6 cmH20] 6 cmH20  ? ?Intake/Output Summary (Last 24 hours) at 02/22/2022 0921 ?Last data filed at 02/22/2022 0600 ?Gross per 24 hour  ?Intake 1902.82 ml  ?Output 550 ml  ?Net 1352.82 ml  ? ?Filed Weights  ? 02/19/22 1121 02/21/22 0500 02/22/22 0600  ?Weight: 97.5 kg 97.5 kg 99.3 kg  ? ? ?Examination: ?General this is a 61 year old female patient who remains critically ill she is currently on nasal cannula  ?HEENT normocephalic atraumatic mucous membranes moist  ?Pulmonary scattered rhonchi currently on nasal cannula  ?Cardiac regular rate and rhythm  ?Abdomen soft not tender  ?Extremities are warm and dry  ?Neuro currently not verbal, very garbled when she is awake.  Able to say 1 or 2 words, will not follow commands, not directable, no focal deficits appreciated currently she is sedated following Ativan  ?GU clear yellow  ? ?Resolved Hospital Problem list   ? ? ?Assessment & Plan:  ?Principal Problem: ?  Acute metabolic encephalopathy ?Active Problems: ?  Acute respiratory failure  with hypoxia and hypercapnia ([place]) ?  Hypertension ?  PUD (peptic ulcer disease) ?  Elevated troponin ?  History of UTI ?  Hypokalemia ?  Class 3 severe obesity due to excess calories with serious comorbidity and body mass index (BMI) of 40.0 to 44.9 in adult [facility]) ?  Microcytosis ?  Diastolic  dysfunction ? ? ? ? ?Acute on chronic hypercapneic resp failure- underlying untreated OSA, prolonged hospital course, delirium leading to inability to blow off CO2- resolved with vent ?Plan ?Minimizing sedation  ?BiPAP at at bedtime and as needed  ?Aspiration/reflux precautions keeping her n.p.o.  ?Continue pulse oximetry  ? ?Unusual psychosis/delirium/encephalopathy picture being follow by both psychiatry and neurology ?-Added Seroquel 5/9 ?Plan ?Following up send out labs from lumbar puncture ?Increase Seroquel to 200 twice a day ?She has been off Depakote due to elevated levels ?Off sedating drips ?Going to decrease ativan dosing (trying to avoid large swings) ? ? ?Fever  ?Plan ?Cont to trend  ?Wbc cont  ? ?Drug induced Hypotension ?Plan ?PRN NEO  ? ?Fluid and electrolyte imbalance: hypernatremia  ?Plan ?Increase free water to 75cc/hr ?Am chem   ? ? ?Muscular deconditioning ?Plan ?Supportive care  ? ?Dysphagia  ?Plan ?tubefeeds ? ?HTN ?Plan ?Holding meds ? ?GERD ?Plan ?PPI ? ?Best Practice (right click and "Reselect all SmartList Selections" daily)  ? ?Diet/type: NPO ?DVT prophylaxis: SCD->resume [place] heparin 5/10  ?GI prophylaxis: N/A ?Lines: N/A ?Foley:  N/A ?Code Status:  full code ?Last date of multidisciplinary goals of care discussion [Pending] ?Family: Husband updated at bedside 5/9 ?My cct 32 min  ?Erick Colace ACNP-BC ?[place] ?Pager # [phone] OR # [phone] if no answer ? ? ?

## 2022-02-22 NOTE — Progress Notes (Signed)
PT Cancellation Note ? ?Patient Details ?Name: Tamara Russell ?MRN: [id number] ?DOB: 1961-04-17 ? ? ?Cancelled Treatment:    Reason Eval/Treat Not Completed: Other (comment) Pt reports pt had been given sedating medication and is difficult to arouse. ? ?Wyona Almas, PT, DPT ?Acute Rehabilitation Services ?Pager [phone] ?Office [phone] ? ? ? ?Carloine Margo Aye ?02/22/2022, 12:15 PM ?

## 2022-02-22 NOTE — Progress Notes (Signed)
Per RN pt was extremely agitated and has since fallen a sleep. VS stable. No Bipap needed at this time. RT and RN will monitor.  ?

## 2022-02-22 NOTE — Progress Notes (Signed)
eLink Physician-Brief Progress Note ?Patient Name: Tamara Russell ?DOB: 29-Dec-1960 ?MRN: [id number] ? ? ?Date of Service ? 02/22/2022  ?HPI/Events of Note ? Patient with oliguria, bedside RN also reports rhonchi on auscultation, however patient has been hypernatremic , making his volume status uncertain.  ?eICU Interventions ? BNP ordered, bedside RN will also attempt to obtain a CVP.  ? ? ? ?  ? ?Migdalia Dk ?02/22/2022, 12:37 AM ?

## 2022-02-22 NOTE — Progress Notes (Signed)
Occupational Therapy Treatment ?Patient Details ?Name: Mattie Ulinski ?MRN: [id number] ?DOB: 08-Jul-1961 ?Today's Date: 02/22/2022 ? ? ?History of present illness 61 y.o. female who presents after being found altered this morning sitting on the porch. Patient had just recently been hospitalized from 4/19-4/23 after presenting for altered mental status. MRI negative; Psychiatry consult due to recent home stressors and pt with hallucinations; +delirium  PMH significant of hypertension, reformed smoker, recently hypoxic and started on home O2, and obesity. ?  ?OT comments ? Patient seen with PT this date due to patients complexities and prior agitation.  Patient able to participate this date, Max A of 2 for basic bed mobility, and able to sit EOB for a few minutes to work on trunk balance, trunk rotation, holding head up, visual scanning and command following.  OT to continue efforts in the acute setting with SNF being recommended for post acute rehab prior to returning home.    ? ?Recommendations for follow up therapy are one component of a multi-disciplinary discharge planning process, led by the attending physician.  Recommendations may be updated based on patient status, additional functional criteria and insurance authorization. ?   ?Follow Up Recommendations ? Skilled nursing-short term rehab (<3 hours/day)  ?  ?Assistance Recommended at Discharge Frequent or constant Supervision/Assistance  ?Patient can return home with the following ? Two people to help with walking and/or transfers;A little help with walking and/or transfers;A lot of help with bathing/dressing/bathroom;Direct supervision/assist for financial management;Direct supervision/assist for medications management ?  ?Equipment Recommendations ? Tub/shower seat;Wheelchair (measurements OT);Wheelchair cushion (measurements OT)  ?  ?Recommendations for Other Services   ? ?  ?Precautions / Restrictions Precautions ?Precautions: Fall;Other  (comment) ?Precaution Comments: watch O2 sats ?Restrictions ?Weight Bearing Restrictions: No  ? ? ?  ? ?Mobility Bed Mobility ?Overal bed mobility: Needs Assistance ?Bed Mobility: Supine to Sit, Sit to Supine ?Rolling: Total assist ?  ?Supine to sit: +2 for physical assistance, Total assist ?Sit to supine: Total assist, +2 for physical assistance ?  ?  ?  ? ?Transfers ?  ?  ?  ?  ?  ?  ?  ?  ?  ?  ?  ?  ?Balance Overall balance assessment: Needs assistance ?Sitting-balance support: Bilateral upper extremity supported, Feet supported ?Sitting balance-Leahy Scale: Poor ?Sitting balance - Comments: min assist initially and supervision to min guard during session ?  ?  ?  ?  ?  ?  ?  ?  ?  ?  ?  ?  ?  ?  ?  ?   ? ?ADL either performed or assessed with clinical judgement  ? ?ADL   ?Eating/Feeding: NPO ?  ?Grooming: Sitting;Wash/dry face;Wash/dry hands;Total assistance ?  ?Upper Body Bathing: Bed level;Total assistance ?  ?Lower Body Bathing: Total assistance;Bed level ?  ?Upper Body Dressing : Bed level;Total assistance ?  ?Lower Body Dressing: Total assistance;Bed level ?  ?  ?  ?Toileting- Clothing Manipulation and Hygiene: Total assistance;Bed level ?  ?  ?  ?  ?General ADL Comments: Total assist at bed level ?  ? ?Extremity/Trunk Assessment Upper Extremity Assessment ?Upper Extremity Assessment: Generalized weakness ?RUE Deficits / Details: very weak grip, unable to reach mouth, or lift arm off bed ?RUE Sensation: WNL ?RUE Coordination: decreased fine motor;decreased gross motor ?LUE Deficits / Details: very weak grip, unable to reach mouth, or lift arm off bed ?LUE Sensation: WNL ?LUE Coordination: decreased fine motor;decreased gross motor ?  ?Lower Extremity Assessment ?Lower Extremity Assessment:  Defer to PT evaluation ?  ?Cervical / Trunk Assessment ?Cervical / Trunk Assessment: Other exceptions ?Cervical / Trunk Exceptions: obesity ?  ? ?Vision   ?Vision Assessment?: Vision impaired- to be further tested in  functional context ?Additional Comments: difficulty with tracking, eyes closing ?  ?Perception Perception ?Perception: Not tested ?  ?Praxis Praxis ?Praxis: Not tested ?  ? ?Cognition Arousal/Alertness: Awake/alert, Suspect due to medications, Lethargic ?Behavior During Therapy: Restless ?Overall Cognitive Status: Impaired/Different from baseline ?Area of Impairment: Safety/judgement, Awareness, Following commands, Attention, Orientation ?  ?  ?  ?  ?  ?  ?  ?  ?Orientation Level: Person ?Current Attention Level: Focused ?  ?Following Commands: Follows one step commands consistently ?Safety/Judgement: Decreased awareness of safety, Decreased awareness of deficits ?Awareness: Intellectual ?Problem Solving: Slow processing, Decreased initiation, Difficulty sequencing, Requires verbal cues, Requires tactile cues ?  ?  ?  ?   ?Exercises   ? ?  ?Shoulder Instructions   ? ? ?  ?General Comments    ? ? ?Pertinent Vitals/ Pain       Pain Assessment ?Facial Expression: Tense ?Body Movements: Absence of movements ?Muscle Tension: Relaxed ?Compliance with ventilator (intubated pts.): N/A ?Vocalization (extubated pts.): N/A ?CPOT Total: 1 ? ?   ?  ?  ?  ?  ?  ?  ?  ?  ?  ?  ?  ?  ?  ?  ?  ?  ?  ?  ? ?  ?    ?  ?  ?  ?   ? ?Frequency ? Min 2X/week  ? ? ? ? ?  ?Progress Toward Goals ? ?OT Goals(current goals can now be found in the care plan section) ? Progress towards OT goals: Goals drowngraded-see care plan ? ?Acute Rehab OT Goals ?OT Goal Formulation: Patient unable to participate in goal setting ?Time For Goal Achievement: 03/08/22 ?Potential to Achieve Goals: Fair ?ADL Goals ?Pt Will Perform Grooming: with min guard assist;sitting ?Pt Will Perform Upper Body Dressing: with mod assist;sitting ?Pt Will Perform Lower Body Dressing: with mod assist;sitting/lateral leans ?Pt Will Transfer to Toilet: with mod assist;stand pivot transfer;bedside commode ?Additional ADL Goal #1: Tolerate sitting edge of bed unsupported up to 10 min  to increase ADL independence  ?Plan Discharge plan remains appropriate   ? ?Co-evaluation ? ? ? PT/OT/SLP Co-Evaluation/Treatment: Yes ?Reason for Co-Treatment: Complexity of the patient's impairments (multi-system involvement);Necessary to address cognition/behavior during functional activity;For patient/therapist safety ?  ?OT goals addressed during session: ADL's and self-care ?  ? ?  ?AM-PAC OT "6 Clicks" Daily Activity     ?Outcome Measure ? ? Help from another person eating meals?: Total ?Help from another person taking care of personal grooming?: Total ?Help from another person toileting, which includes using toliet, bedpan, or urinal?: Total ?Help from another person bathing (including washing, rinsing, drying)?: Total ?Help from another person to put on and taking off regular upper body clothing?: Total ?Help from another person to put on and taking off regular lower body clothing?: Total ?6 Click Score: 6 ? ?  ?End of Session Equipment Utilized During Treatment: Oxygen ? ?OT Visit Diagnosis: Unsteadiness on feet (R26.81);Other symptoms and signs involving cognitive function;Dizziness and giddiness (R42) ?  ?Activity Tolerance Patient tolerated treatment well ?  ?Patient Left in bed;with family/visitor present ?  ?Nurse Communication Mobility status ?  ? ?   ? ?Time: [id number] ?OT Time Calculation (min): 33 min ? ?Charges: OT General Charges ?$OT Visit: 1 Visit ?  OT Treatments ?$Self Care/Home Management : 8-22 mins ? ?02/22/2022 ? ?RP, OTR/L ? ?Acute Rehabilitation Services ? ?Office:  [phone] ? ? ?Myeshia Fojtik D Blayklee Mable ?02/22/2022, 4:46 PM ?

## 2022-02-22 NOTE — Progress Notes (Signed)
Physical Therapy Treatment ?Patient Details ?Name: Tamara Russell ?MRN: [id number] ?DOB: 1961/01/16 ?Today's Date: 02/22/2022 ? ? ?History of Present Illness 61 y.o. female who presents after being found altered this morning sitting on the porch. Patient had just recently been hospitalized from 4/19-4/23 after presenting for altered mental status. MRI negative; Psychiatry consult due to recent home stressors and pt with hallucinations; +delirium. Pt intubated 5/7-5/9  PMH significant of hypertension, reformed smoker, recently hypoxic and started on home O2, and obesity. ? ?  ?PT Comments  ? ? Pt extubated 5/9 to HFNC and received Ativan this AM. Pt alert, but demonstrates decreased command following and inconsistent visual tracking. Pt requiring two person total assist for bed mobility. On edge of bed, worked on static sitting balance, trunk activation/stability, command following, and cervical stretching. Pt following ~2 commands throughout session. Intermittent grunting, but no agitation noted. Will continue to progress as tolerated. ?  ?Recommendations for follow up therapy are one component of a multi-disciplinary discharge planning process, led by the attending physician.  Recommendations may be updated based on patient status, additional functional criteria and insurance authorization. ? ?Follow Up Recommendations ? Skilled nursing-short term rehab (<3 hours/day) ?  ?  ?Assistance Recommended at Discharge Frequent or constant Supervision/Assistance  ?Patient can return home with the following Assistance with cooking/housework;Direct supervision/assist for medications management;Direct supervision/assist for financial management;Help with stairs or ramp for entrance;Assist for transportation;Two people to help with walking and/or transfers;A lot of help with bathing/dressing/bathroom ?  ?Equipment Recommendations ? None recommended by PT  ?  ?Recommendations for Other Services   ? ? ?  ?Precautions /  Restrictions Precautions ?Precautions: Fall;Other (comment) ?Precaution Comments: watch O2 sats ?Restrictions ?Weight Bearing Restrictions: No  ?  ? ?Mobility ? Bed Mobility ?Overal bed mobility: Needs Assistance ?Bed Mobility: Supine to Sit, Sit to Supine ?  ?  ?Supine to sit: Total assist, +2 for physical assistance ?Sit to supine: +2 for physical assistance, Total assist ?  ?General bed mobility comments: requiring totalA + 2 for supine <> sit, no initiation noted by pt ?  ? ?Transfers ?  ?  ?  ?  ?  ?  ?  ?  ?  ?  ?  ? ?Ambulation/Gait ?  ?  ?  ?  ?  ?  ?  ?  ? ? ?Stairs ?  ?  ?  ?  ?  ? ? ?Wheelchair Mobility ?  ? ?Modified Rankin (Stroke Patients Only) ?  ? ? ?  ?Balance Overall balance assessment: Needs assistance ?Sitting-balance support: Bilateral upper extremity supported, Feet supported ?Sitting balance-Leahy Scale: Poor ?Sitting balance - Comments: min assist initially and supervision to min guard during session. stool placed under pt feet ?  ?  ?  ?  ?  ?  ?  ?  ?  ?  ?  ?  ?  ?  ?  ?  ? ?  ?Cognition Arousal/Alertness: Suspect due to medications, Lethargic ?Behavior During Therapy: Restless ?Overall Cognitive Status: Impaired/Different from baseline ?Area of Impairment: Safety/judgement, Awareness, Following commands, Attention ?  ?  ?  ?  ?  ?  ?  ?  ?  ?Current Attention Level: Focused ?  ?Following Commands: Follows one step commands inconsistently ?Safety/Judgement: Decreased awareness of safety, Decreased awareness of deficits ?Awareness: Intellectual ?  ?General Comments: Pt following ~2 commands throughout session, will grunt with touch intermittently, not visually tracking consistently ?  ?  ? ?  ?Exercises Other Exercises ?Other  Exercises: Sitting EOB: cervical rotation to R/L, truncal rotation to R/L, lateral leans, cervical extension/flexion, LAQ's ? ?  ?General Comments   ?  ?  ? ?Pertinent Vitals/Pain Pain Assessment ?Pain Assessment: PAINAD ?Negative Vocalization: occasional moan/groan,  low speech, negative/disapproving quality ?Facial Expression: smiling or inexpressive ?Body Language: relaxed ?Consolability: no need to console  ? ? ?Home Living   ?  ?  ?  ?  ?  ?  ?  ?  ?  ?   ?  ?Prior Function    ?  ?  ?   ? ?PT Goals (current goals can now be found in the care plan section) Acute Rehab PT Goals ?Patient Stated Goal: unable to state ?Potential to Achieve Goals: Fair ? ?  ?Frequency ? ? ? Min 3X/week ? ? ? ?  ?PT Plan Current plan remains appropriate  ? ? ?Co-evaluation PT/OT/SLP Co-Evaluation/Treatment: Yes ?Reason for Co-Treatment: Complexity of the patient's impairments (multi-system involvement);Necessary to address cognition/behavior during functional activity;For patient/therapist safety;To address functional/ADL transfers ?PT goals addressed during session: Mobility/safety with mobility ?OT goals addressed during session: ADL's and self-care ?  ? ?  ?AM-PAC PT "6 Clicks" Mobility   ?Outcome Measure ? Help needed turning from your back to your side while in a flat bed without using bedrails?: Total ?Help needed moving from lying on your back to sitting on the side of a flat bed without using bedrails?: Total ?Help needed moving to and from a bed to a chair (including a wheelchair)?: Total ?Help needed standing up from a chair using your arms (e.g., wheelchair or bedside chair)?: Total ?Help needed to walk in hospital room?: Total ?Help needed climbing 3-5 steps with a railing? : Total ?6 Click Score: 6 ? ?  ?End of Session Equipment Utilized During Treatment: Oxygen ?Activity Tolerance: Patient tolerated treatment well (VSS) ?Patient left: with call bell/phone within reach;with family/visitor present;in bed;with bed alarm set ?Nurse Communication: Mobility status ?PT Visit Diagnosis: Other abnormalities of gait and mobility (R26.89);Muscle weakness (generalized) (M62.81) ?  ? ? ?Time: [id number] ?PT Time Calculation (min) (ACUTE ONLY): 32 min ? ?Charges:  $Therapeutic Activity: 8-22  mins          ?          ? ?Wyona Almas, PT, DPT ?Acute Rehabilitation Services ?Pager [phone] ?Office [phone] ? ? ? ?Tamara Russell ?02/22/2022, 5:02 PM ? ?

## 2022-02-22 NOTE — Consult Note (Signed)
?  Psychiatry consult liaison team attempted to assess patient, however patient difficult to arouse.  She did receive sedative medication prior to psychiatric reassessment.  Will attempt to return this afternoon to reassess patient. ?

## 2022-02-23 DIAGNOSIS — G9341 Metabolic encephalopathy: Secondary | ICD-10-CM | POA: Diagnosis not present

## 2022-02-23 LAB — BASIC METABOLIC PANEL
Anion gap: 5 (ref 5–15)
BUN: 18 mg/dL (ref 8–23)
CO2: 41 mmol/L — ABNORMAL HIGH (ref 22–32)
Calcium: 9 mg/dL (ref 8.9–10.3)
Chloride: 104 mmol/L (ref 98–111)
Creatinine, Ser: 0.58 mg/dL (ref 0.44–1.00)
GFR, Estimated: >60 mL/min (ref >60)
Glucose, Bld: 149 mg/dL — ABNORMAL HIGH (ref 70–99)
Potassium: 3.7 mmol/L (ref 3.5–5.1)
Sodium: 150 mmol/L — ABNORMAL HIGH (ref 135–145)

## 2022-02-23 LAB — CBC
HCT: 45.2 % (ref 36.0–46.0)
Hemoglobin: 13 g/dL (ref 12.0–15.0)
MCH: 23.5 pg — ABNORMAL LOW (ref 26.0–34.0)
MCHC: 28.8 g/dL — ABNORMAL LOW (ref 30.0–36.0)
MCV: 81.6 fL (ref 80.0–100.0)
Platelets: 247 10*3/uL (ref 150–400)
RBC: 5.54 MIL/uL — ABNORMAL HIGH (ref 3.87–5.11)
RDW: 17.3 % — ABNORMAL HIGH (ref 11.5–15.5)
WBC: 8.1 10*3/uL (ref 4.0–10.5)
nRBC: 0 % (ref 0.0–0.2)

## 2022-02-23 LAB — MAGNESIUM: Magnesium: 2.3 mg/dL (ref 1.7–2.4)

## 2022-02-23 LAB — GLUCOSE, CAPILLARY
Glucose-Capillary: 135 mg/dL — ABNORMAL HIGH (ref 70–99)
Glucose-Capillary: 140 mg/dL — ABNORMAL HIGH (ref 70–99)
Glucose-Capillary: 150 mg/dL — ABNORMAL HIGH (ref 70–99)
Glucose-Capillary: 157 mg/dL — ABNORMAL HIGH (ref 70–99)
Glucose-Capillary: 164 mg/dL — ABNORMAL HIGH (ref 70–99)
Glucose-Capillary: 168 mg/dL — ABNORMAL HIGH (ref 70–99)

## 2022-02-23 LAB — HSV 1/2 PCR, CSF
HSV-1 DNA: NEGATIVE
HSV-2 DNA: NEGATIVE

## 2022-02-23 LAB — CSF CULTURE W GRAM STAIN: Culture: NO GROWTH

## 2022-02-23 MED ORDER — QUETIAPINE FUMARATE 100 MG PO TABS: 100.0000 mg | ORAL_TABLET | Freq: Two times a day (BID) | ORAL | Status: DC

## 2022-02-23 MED ORDER — QUETIAPINE FUMARATE 100 MG PO TABS: 100.0000 mg | ORAL_TABLET | Freq: Every morning | ORAL | Status: DC

## 2022-02-23 MED ORDER — POTASSIUM CHLORIDE 20 MEQ PO PACK
40.0000 meq | PACK | Freq: Once | ORAL | Status: AC
  Administered 2022-02-23: 40 meq
  Filled 2022-02-23: qty 2

## 2022-02-23 MED ORDER — FREE WATER
300.0000 mL | Freq: Four times a day (QID) | Status: DC
  Administered 2022-02-23 – 2022-02-24 (×3): 300 mL

## 2022-02-23 MED ORDER — QUETIAPINE FUMARATE 100 MG PO TABS
100.0000 mg | ORAL_TABLET | Freq: Three times a day (TID) | ORAL | Status: DC
Start: 2022-02-23 — End: 2022-02-25
  Administered 2022-02-23 – 2022-02-25 (×7): 100 mg
  Filled 2022-02-23 (×7): qty 1

## 2022-02-23 MED ORDER — FREE WATER
200.0000 mL | Freq: Four times a day (QID) | Status: DC
Start: 2022-02-23 — End: 2022-02-23

## 2022-02-23 MED ORDER — QUETIAPINE FUMARATE 100 MG PO TABS
200.0000 mg | ORAL_TABLET | Freq: Every day | ORAL | Status: DC
  Administered 2022-02-23 – 2022-02-24 (×2): 200 mg
  Filled 2022-02-23 (×2): qty 2

## 2022-02-23 MED ORDER — QUETIAPINE FUMARATE 100 MG PO TABS: 200.0000 mg | ORAL_TABLET | Freq: Every day | ORAL | Status: DC

## 2022-02-23 NOTE — Progress Notes (Signed)
? ? ?NAME:  Tamara Russell, MRN:  [id number], DOB:  September 06, 1961, LOS: 46 ?ADMISSION DATE:  02/06/2022, CONSULTATION DATE:  02/18/22 ?REFERRING MD:  DR Sloan Leiter, CHIEF COMPLAINT:  acute respiratory failure  ? ?History of Present Illness:  ?61 year old woman with a history of obesity, OSA (untreated), hypertension, former tobacco use who was admitted on 02/06/2022 with encephalopathy and altered mental status.  She had been admitted earlier in April for same, found to be hypoxemic and treated empirically for possible urinary tract infection.  She never really returned to baseline.  Complicated subsequent course characterized by encephalopathy and evolving paranoia with agitated delirium.  Has received multiple psychotropic medications, potentially sedating medications and has required BiPAP for associated hypoxic and hypercapnic respiratory failure.  On at least 1 occasion she had an aspiration event, was noted to have bibasilar opacities on chest x-ray and Ancef was started for presumed aspiration.  Neurology has seen the patient, question possible autoimmune process and hoping for LP to be done under sedation by interventional radiology.  She has had trial of Zyprexa, Geodon, Haldol plus Ativan, Depakote.  Brain imaging reassuring, EEG reassuring. ?On 5/6 she experienced evolving lethargy.  ABG confirmed a significant respiratory acidosis, PCO2 93.  She will moved to the ICU for BiPAP and further care. ? ?Pertinent  Medical History  ? ?Past Medical History:  ?Diagnosis Date  ? Acid reflux   ? Complication of anesthesia   ? slow to wake up  ? History of COVID-19   ? Hypertension   ? ? ?Significant Hospital Events: ?Including procedures, antibiotic start and stop dates in addition to other pertinent events   ?5/7 intubated, eeg neg neuro consulted.  ?5/8 LP completed. Cor trak placed.  ?5/9 working on weaning sedation. Had some sedation induced hypotension, extubated, having Wild swings of agitated delirium with  psychosis, and then episodes of minimal responsiveness there was some correlation between hypotension and decreased LOC.  We did add scheduled Seroquel ?5/10 continues to have wide swings where she is agitated, and uncontrollable versus sedated ? ?Interim History / Subjective:  ?When I tell her I am going to examined her she yells "oh hell no" and requires redirection. ? ?Husband at bedside. ?Still swings in alertness levels but has managed to protect airway to date. ? ?Objective   ?Blood pressure (!) 99/59, pulse (!) 108, temperature (!) 100.9 ?F (38.3 ?C), resp. rate (!) 25, height 5\' 1"  (1.549 m), weight 99.1 kg, SpO2 97 %. ?CVP:  [6 mmHg-60 mmHg] 38 mmHg  ?FiO2 (%):  [40 %] 40 %  ? ?Intake/Output Summary (Last 24 hours) at 02/23/2022 1258 ?Last data filed at 02/23/2022 1200 ?Gross per 24 hour  ?Intake 1369.75 ml  ?Output 750 ml  ?Net 619.75 ml  ? ? ?Filed Weights  ? 02/21/22 0500 02/22/22 0600 02/23/22 0500  ?Weight: 97.5 kg 99.3 kg 99.1 kg  ? ? ?Examination: ?No distress ?Gurgling respirations yesterday have improved ?Ext warm ?Abdomen soft ?Ao to self only ?No edema ? ?Sodium up ?Cr okay ?CBC benign ? ?Resolved Hospital Problem list   ? ? ?Assessment & Plan:  ?Principal Problem: ?  Acute metabolic encephalopathy ?Active Problems: ?  Acute respiratory failure with hypoxia and hypercapnia (HCC) ?  Hypertension ?  PUD (peptic ulcer disease) ?  Elevated troponin ?  History of UTI ?  Hypokalemia ?  Class 3 severe obesity due to excess calories with serious comorbidity and body mass index (BMI) of 40.0 to 44.9 in adult [facility]) ?  Microcytosis ?  Diastolic dysfunction ?  Encephalopathy ? ? ? ? ?Acute on chronic hypercapneic resp failure- underlying untreated OSA, prolonged hospital course, delirium leading to inability to blow off CO2- resolved with vent ?Plan ?Minimizing sedation  ?BiPAP at at bedtime and as needed  ?Aspiration/reflux precautions keeping her n.p.o.  ?Continue pulse oximetry  ? ?Unusual  psychosis/delirium/encephalopathy picture being follow by both psychiatry and neurology ?-Added Seroquel 5/9 ?Plan ?Seroquel to 200 twice a day ?She has been off Depakote due to elevated levels ?Off sedating drips ?Continue PRN reduced dose ativan ? ?Fever  ?Plan ?Cont to trend  ?Wbc cont  ?Low grade ? ? ?Fluid and electrolyte imbalance: hypernatremia  ?Plan ?Enteral free water ?DC D5w ? ? ?Muscular deconditioning ?Plan ?Supportive care  ? ?Dysphagia  ?Plan ?tubefeeds ? ?HTN ?Plan ?Holding meds ? ?GERD ?Plan ?PPI ? ?Best Practice (right click and "Reselect all SmartList Selections" daily)  ? ?Diet/type: TF ?DVT prophylaxis: heparin ?GI prophylaxis: PPI (PTA) ?Lines: N/A ?Foley:  N/A ?Code Status:  full code ?Last date of multidisciplinary goals of care discussion [Pending] ?Family: Husband updated at bedside 5/11 ? ?I'm a bit unclear what we are treating here, keep in ICU one more day to assure continued resp improvement ? ?Erskine Emery MD PCCM ? ?

## 2022-02-23 NOTE — Progress Notes (Signed)
Subjective: ?Serayah Janese Wiltse is a 61 y.o. female with PMH significant for  has a past medical history of Acid reflux, Complication of anesthesia, History of COVID-19, and Hypertension. who presents to [facility] on 04/24 with acute encephalopathy with associated hallucination and paranoia complicated by hypercarbic respiratory failure with short course of MV and s/p lumbar puncture. ? ?Patient sleeping in bed without agitation. She noted to be more arousable yesterday afternoon. Overall, seems to be able to answer questions more appropriately and not as paranoid. Otherwise, no new complaints today. ? ?Exam: ?Vitals:  ? 02/23/22 0817 02/23/22 0900  ?BP: 122/73 102/68  ?Pulse: (!) 114 (!) 110  ?Resp: (!) 25 20  ?Temp: (!) 100.4 ?F (38 ?C)   ?SpO2: 96% 96%  ? ?Gen: In bed, NAD ?Resp: non-labored breathing, no acute distress ?Abd: soft, nt ? ?Neuro: ?MS: Patient is sleepy difficult to fully assess mental status however seems to be having improvements with alertness ?CN: No nerves grossly intact ?Motor: Moving all 4 extremities spontaneously ?Sensory: No sensory deficits noted ? ?Pertinent Labs: ? ?  Latest Ref Rng & Units 02/23/2022  ?  5:19 AM 02/22/2022  ?  3:34 AM 02/21/2022  ? 11:59 AM  ?CBC  ?WBC 4.0 - 10.5 K/uL 8.1   10.1     ?Hemoglobin 12.0 - 15.0 g/dL 13.0   12.7   14.6    ?Hematocrit 36.0 - 46.0 % 45.2   43.1   43.0    ?Platelets 150 - 400 K/uL 247   222     ? ? ?  Latest Ref Rng & Units 02/23/2022  ?  5:19 AM 02/22/2022  ?  3:34 AM 02/21/2022  ? 11:59 AM  ?BMP  ?Glucose 70 - 99 mg/dL 149   133     ?BUN 8 - 23 mg/dL 18   22     ?Creatinine 0.44 - 1.00 mg/dL 0.58   0.56     ?Sodium 135 - 145 mmol/L 150   146   146    ?Potassium 3.5 - 5.1 mmol/L 3.7   3.6   3.6    ?Chloride 98 - 111 mmol/L 104   104     ?CO2 22 - 32 mmol/L 41   38     ?Calcium 8.9 - 10.3 mg/dL 9.0   8.6     ? ? ?Impression:  ? ?Giannina Sander Stogner is a 61 y.o. female with PMH significant for  has a past medical history of Acid reflux, Complication of  anesthesia, History of COVID-19, and Hypertension. who presents to [facility] on 04/24 with acute encephalopathy with associated hallucination and paranoia complicated by hypercarbic respiratory failure with short course of MV and s/p lumbar puncture. ?  ?Acute encephalopathy 2/2 to atypical delirium with psychosis versus autoimmune encephalitis   ?Status post LP still waiting on final results.  Has been started on quetiapine and is tolerating it well.  Does seem to be having some improvement with her paranoia on quetiapine.  If she continues to improve we will push for CT chest/abdomen/pelvis for further work-up for possible primary malignancy.  ? ?Recommendations: ? ?Acute encephalopathy 2/2 to atypical delirium with psychosis versus autoimmune encephalitis  ?- LP results without pleocytosis, waiting on serologies and HSV PCR ?-Continue Seroquel per primary team and psychiatry  ?-Will need CT chest/abd/pelvis w/ wo contrast when patient is more stable for primary malignancy work up  ? ?Lawerance Cruel, D.O.  ?Internal Medicine Resident, PGY-3 ?[facility]  ?  10:46 AM, 02/23/2022  ? ? ?

## 2022-02-23 NOTE — Progress Notes (Signed)
Nutrition Follow-up ? ?DOCUMENTATION CODES:  ? ?Obesity unspecified ? ?INTERVENTION:  ? ?Tube Feeding via Cortrak: ?Vital AF 1.2 at 55 ml/hr ?This provides 1584 kcals, 99 g of protein, 1003 mL of free water ? ?Continue free water per MD order for hypernatremia ? ?Remains constipated; no BM since 5/02, recommend considering enema and/or suppository if persists ? ? ?NUTRITION DIAGNOSIS:  ? ?Inadequate oral intake related to acute illness as evidenced by NPO status. ? ?Being addressed via TF  ? ?GOAL:  ? ?Patient will meet greater than or equal to 90% of their needs ? ?Met via TF ? ?MONITOR:  ? ?Vent status, Labs, Weight trends, TF tolerance ? ?REASON FOR ASSESSMENT:  ? ?Ventilator, Other (Comment) (Cortrak Consult) ?  ? ?ASSESSMENT:  ? ?61 yo female admitted with acute psychosis/delerium, developed acute on chronic respiratory failure and required transfer to ICU and intubation.  PMH includes HTN, GERD, hx of COVID-19 infection ? ?4/25 Admitted ?5/07 Intubated ?5/08 IR for LP, Cortrak placed ?5/09 Extubated ? ?Remains NPO. Mental status waxes and wanes ? ?Vital AF 1.2 infusing at 55 ml/hr via Cortrak ? ?Sodium trending up, 150 today; free water ordered at 300 mL q 6 hours.  ? ?Current wt 99.1 kg; admit weight 97.5 kg ? ?Labs: Creatinine wdl, BUN wdl, sodium 150 (H) ?Meds: colace, lactulose, miralax, thiamine ? ?Diet Order:   ?Diet Order   ? ?       ?  Diet NPO time specified  Diet effective now       ?  ? ?  ?  ? ?  ? ? ?EDUCATION NEEDS:  ? ?Not appropriate for education at this time ? ?Skin:  Skin Assessment: Reviewed RN Assessment ? ?Last BM:  5/02 ? ?Height:  ? ?Ht Readings from Last 1 Encounters:  ?02/18/22 5' 1"  (1.549 m)  ? ? ?Weight:  ? ?Wt Readings from Last 1 Encounters:  ?02/23/22 99.1 kg  ? ? ?BMI:  Body mass index is 41.28 kg/m?. ? ?Estimated Nutritional Needs:  ? ?Kcal:  1550-1750 kcals ? ?Protein:  85-105g ? ?Fluid:  >/= 1.5 L ? ? ?Kerman Passey MS, RDN, LDN, CNSC ?Registered Dietitian III ?Clinical  Nutrition ?RD Pager and On-Call Pager Number Located in [place]  ? ?

## 2022-02-23 NOTE — Consult Note (Signed)
? ?  [facility] CM Inpatient Consult ? ? ?02/23/2022 ? ?Tamara Russell ?1961/09/05 ?[id number] ? ?[facility] Customer service manager [THN]  Accountable Care Organization [ACO] Patient: [name] [employer] ? ?Primary Care Provider:  [name], MD listed in EPIC ?[Patient is showing in [employer] with [place] [facility]] ? ?*Confidential encounter record ? ?Patient screened for readmission with length of stay 17 days hospitalization with noted high risk score for unplanned readmission risk. Review of patient's medical record reveals patient is in ICU level of care.  Patient progress notes shows patient was intubated 02/19/22 to 02/21/22 to high flow oxygen [place].  Reviewed PT/OT evaluation and notes for transition of care recommendations for a skilled nursing facility level of care currently. Also, reviewed psychiatry notes for updates and progress. ? ? ?Plan:  Continue to follow progress and disposition to assess for post hospital care management needs for ongoing [facility] Care Management/Embedded needs. ? ?For questions contact:  ? ?Charlesetta Shanks, RN BSN CCM ?[facility] [employer] Liaison ? [phone] business mobile phone ?Toll free office [phone]  ?Fax number: [phone] ?[email or web address] ?[email or web address]  ?  ? ?

## 2022-02-23 NOTE — Progress Notes (Signed)
OT Cancellation Note ? ?Patient Details ?Name: Tamara Russell ?MRN: [id number] ?DOB: 03-20-1961 ? ? ?Cancelled Treatment:    Reason Eval/Treat Not Completed: Fatigue/lethargy limiting ability to participate ? ?[name] ?02/23/2022, 2:57 PM ?02/23/2022 ? ?RP, OTR/L ? ?Acute Rehabilitation Services ? ?Office:  [phone] ? ?

## 2022-02-23 NOTE — Progress Notes (Signed)
[facility] ADULT ICU REPLACEMENT PROTOCOL ? ? ?The patient does apply for the [facility] Adult ICU Electrolyte Replacment Protocol based on the criteria listed below:  ? ?1.Exclusion criteria: TCTS patients, ECMO patients, and Dialysis patients ?2. Is GFR >/= 30 ml/min? Yes.    ?Patient's GFR today is >60 ?3. Is SCr </= 2? Yes.   ?Patient's SCr is 0.58 mg/dL ?4. Did SCr increase >/= 0.5 in 24 hours? No. ?5.Pt's weight >40kg  Yes.   ?6. Abnormal electrolyte(s): K+ 3.7  ?7. Electrolytes replaced per protocol ?8.  Call MD STAT for K+ </= 2.5, Phos </= 1, or Mag </= 1 ?Physician:  N/a ? ?Melvern Banker 02/23/2022 6:26 AM ? ?

## 2022-02-23 NOTE — Consult Note (Signed)
Brief Psychiatry Consult Note  ?Pt seen, too sedated for interview at multiple points through the day (around 10 AM and 2 PM) although per ICU attending has also been agitated, yelling with attempts to have a physical exam.  Spoke to daughter and husband at bedside - was up around 4 and agitated "like a roller coaster" but did recognize him at some point. Too sedated to interact with OT today. Will space out quetiapine to 100/100/200 to hopefully control agitation without oversedation which has been challenging with this pt now in hospital for >2 weeks with ongoing metabolic encephalopathy. ? ? ? ?Claris Che A Temeca Somma ? ?

## 2022-02-24 ENCOUNTER — Inpatient Hospital Stay (HOSPITAL_COMMUNITY): Payer: 59

## 2022-02-24 DIAGNOSIS — G9341 Metabolic encephalopathy: Secondary | ICD-10-CM | POA: Diagnosis not present

## 2022-02-24 LAB — BASIC METABOLIC PANEL
Anion gap: 10 (ref 5–15)
BUN: 32 mg/dL — ABNORMAL HIGH (ref 8–23)
CO2: 36 mmol/L — ABNORMAL HIGH (ref 22–32)
Calcium: 9.5 mg/dL (ref 8.9–10.3)
Chloride: 102 mmol/L (ref 98–111)
Creatinine, Ser: 0.91 mg/dL (ref 0.44–1.00)
GFR, Estimated: >60 mL/min (ref >60)
Glucose, Bld: 166 mg/dL — ABNORMAL HIGH (ref 70–99)
Potassium: 4.7 mmol/L (ref 3.5–5.1)
Sodium: 148 mmol/L — ABNORMAL HIGH (ref 135–145)

## 2022-02-24 LAB — POCT I-STAT 7, (LYTES, BLD GAS, ICA,H+H)
Acid-Base Excess: 16 mmol/L — ABNORMAL HIGH (ref 0.0–2.0)
Acid-Base Excess: 14 mmol/L — ABNORMAL HIGH (ref 0.0–2.0)
Bicarbonate: 43 mmol/L — ABNORMAL HIGH (ref 20.0–28.0)
Bicarbonate: 41.3 mmol/L — ABNORMAL HIGH (ref 20.0–28.0)
Calcium, Ion: 1.25 mmol/L (ref 1.15–1.40)
Calcium, Ion: 1.24 mmol/L (ref 1.15–1.40)
HCT: 49 % — ABNORMAL HIGH (ref 36.0–46.0)
HCT: 45 % (ref 36.0–46.0)
Hemoglobin: 16.7 g/dL — ABNORMAL HIGH (ref 12.0–15.0)
Hemoglobin: 15.3 g/dL — ABNORMAL HIGH (ref 12.0–15.0)
O2 Saturation: 87 %
O2 Saturation: 92 %
Patient temperature: 99.1
Patient temperature: 101.8
Potassium: 4.5 mmol/L (ref 3.5–5.1)
Potassium: 4.5 mmol/L (ref 3.5–5.1)
Sodium: 149 mmol/L — ABNORMAL HIGH (ref 135–145)
Sodium: 149 mmol/L — ABNORMAL HIGH (ref 135–145)
TCO2: 45 mmol/L — ABNORMAL HIGH (ref 22–32)
TCO2: 43 mmol/L — ABNORMAL HIGH (ref 22–32)
pCO2 arterial: 57.1 mmHg — ABNORMAL HIGH (ref 32–48)
pCO2 arterial: 64.4 mmHg — ABNORMAL HIGH (ref 32–48)
pH, Arterial: 7.486 — ABNORMAL HIGH (ref 7.35–7.45)
pH, Arterial: 7.422 (ref 7.35–7.45)
pO2, Arterial: 52 mmHg — ABNORMAL LOW (ref 83–108)
pO2, Arterial: 73 mmHg — ABNORMAL LOW (ref 83–108)

## 2022-02-24 LAB — GLUCOSE, CAPILLARY
Glucose-Capillary: 162 mg/dL — ABNORMAL HIGH (ref 70–99)
Glucose-Capillary: 158 mg/dL — ABNORMAL HIGH (ref 70–99)
Glucose-Capillary: 178 mg/dL — ABNORMAL HIGH (ref 70–99)
Glucose-Capillary: 135 mg/dL — ABNORMAL HIGH (ref 70–99)
Glucose-Capillary: 149 mg/dL — ABNORMAL HIGH (ref 70–99)

## 2022-02-24 LAB — CBC
HCT: 51.1 % — ABNORMAL HIGH (ref 36.0–46.0)
Hemoglobin: 14.5 g/dL (ref 12.0–15.0)
MCH: 23 pg — ABNORMAL LOW (ref 26.0–34.0)
MCHC: 28.4 g/dL — ABNORMAL LOW (ref 30.0–36.0)
MCV: 81.1 fL (ref 80.0–100.0)
Platelets: 288 10*3/uL (ref 150–400)
RBC: 6.3 MIL/uL — ABNORMAL HIGH (ref 3.87–5.11)
RDW: 18.6 % — ABNORMAL HIGH (ref 11.5–15.5)
WBC: 13.4 10*3/uL — ABNORMAL HIGH (ref 4.0–10.5)
nRBC: 0 % (ref 0.0–0.2)

## 2022-02-24 LAB — MAGNESIUM: Magnesium: 2.5 mg/dL — ABNORMAL HIGH (ref 1.7–2.4)

## 2022-02-24 LAB — MRSA NEXT GEN BY PCR, NASAL: MRSA by PCR Next Gen: NOT DETECTED

## 2022-02-24 MED ORDER — METOPROLOL TARTRATE 25 MG/10 ML ORAL SUSPENSION
12.5000 mg | Freq: Two times a day (BID) | ORAL | Status: DC
  Administered 2022-02-24 – 2022-03-01 (×11): 12.5 mg
  Filled 2022-02-24 (×2): qty 10
  Filled 2022-02-24: qty 5
  Filled 2022-02-24 (×3): qty 10
  Filled 2022-02-24: qty 5
  Filled 2022-02-24: qty 10
  Filled 2022-02-24 (×2): qty 5
  Filled 2022-02-24: qty 10
  Filled 2022-02-24: qty 5

## 2022-02-24 MED ORDER — IOHEXOL 300 MG/ML  SOLN
100.0000 mL | Freq: Once | INTRAMUSCULAR | Status: AC | PRN
  Administered 2022-02-24: 100 mL via INTRAVENOUS

## 2022-02-24 MED ORDER — BISACODYL 10 MG RE SUPP
10.0000 mg | Freq: Once | RECTAL | Status: AC
Start: 2022-02-24 — End: 2022-02-24
  Administered 2022-02-24: 10 mg via RECTAL
  Filled 2022-02-24: qty 1

## 2022-02-24 MED ORDER — ETOMIDATE 2 MG/ML IV SOLN
INTRAVENOUS | Status: AC
  Administered 2022-02-24: 10 mg via INTRAVENOUS
  Filled 2022-02-24: qty 10

## 2022-02-24 MED ORDER — SODIUM CHLORIDE 0.9 % IV SOLN
2.0000 g | Freq: Three times a day (TID) | INTRAVENOUS | Status: DC
  Administered 2022-02-24 – 2022-02-26 (×6): 2 g via INTRAVENOUS
  Filled 2022-02-24 (×6): qty 12.5

## 2022-02-24 MED ORDER — DEXTROSE 5 % IV SOLN: INTRAVENOUS | Status: DC

## 2022-02-24 MED ORDER — DOCUSATE SODIUM 50 MG/5ML PO LIQD: 100.0000 mg | Freq: Two times a day (BID) | ORAL | Status: DC

## 2022-02-24 MED ORDER — FENTANYL CITRATE PF 50 MCG/ML IJ SOSY: 50.0000 ug | PREFILLED_SYRINGE | INTRAMUSCULAR | Status: DC | PRN

## 2022-02-24 MED ORDER — ROCURONIUM BROMIDE 10 MG/ML (PF) SYRINGE
PREFILLED_SYRINGE | INTRAVENOUS | Status: AC
  Administered 2022-02-24: 100 mg via INTRAVENOUS
  Filled 2022-02-24: qty 10

## 2022-02-24 MED ORDER — ETOMIDATE 2 MG/ML IV SOLN: 20.0000 mg | Freq: Once | INTRAVENOUS | Status: AC

## 2022-02-24 MED ORDER — FENTANYL CITRATE PF 50 MCG/ML IJ SOSY
PREFILLED_SYRINGE | INTRAMUSCULAR | Status: AC
  Administered 2022-02-24: 50 ug via INTRAVENOUS
  Filled 2022-02-24: qty 2

## 2022-02-24 MED ORDER — PROPOFOL 1000 MG/100ML IV EMUL
INTRAVENOUS | Status: AC
  Administered 2022-02-24: 5 ug/kg/min via INTRAVENOUS
  Filled 2022-02-24: qty 100

## 2022-02-24 MED ORDER — ACETAMINOPHEN 650 MG RE SUPP
650.0000 mg | Freq: Four times a day (QID) | RECTAL | Status: DC | PRN
  Administered 2022-02-28: 650 mg via RECTAL
  Filled 2022-02-24: qty 1

## 2022-02-24 MED ORDER — PROPOFOL 1000 MG/100ML IV EMUL
0.0000 ug/kg/min | INTRAVENOUS | Status: DC
  Administered 2022-02-25: 15 ug/kg/min via INTRAVENOUS
  Filled 2022-02-24 (×2): qty 100

## 2022-02-24 MED ORDER — ROCURONIUM BROMIDE 10 MG/ML (PF) SYRINGE: 100.0000 mg | PREFILLED_SYRINGE | Freq: Once | INTRAVENOUS | Status: AC

## 2022-02-24 MED ORDER — ACETAMINOPHEN 325 MG PO TABS
650.0000 mg | ORAL_TABLET | Freq: Four times a day (QID) | ORAL | Status: DC | PRN
  Administered 2022-02-26 – 2022-03-01 (×3): 650 mg
  Filled 2022-02-24 (×5): qty 2

## 2022-02-24 MED ORDER — LACTATED RINGERS IV BOLUS
1000.0000 mL | Freq: Once | INTRAVENOUS | Status: AC
  Administered 2022-02-24: 1000 mL via INTRAVENOUS

## 2022-02-24 MED ORDER — FENTANYL CITRATE PF 50 MCG/ML IJ SOSY: 100.0000 ug | PREFILLED_SYRINGE | Freq: Once | INTRAMUSCULAR | Status: AC

## 2022-02-24 MED ORDER — POLYETHYLENE GLYCOL 3350 17 G PO PACK: 17.0000 g | PACK | Freq: Every day | ORAL | Status: DC

## 2022-02-24 MED ORDER — FENTANYL CITRATE PF 50 MCG/ML IJ SOSY
100.0000 ug | PREFILLED_SYRINGE | Freq: Once | INTRAMUSCULAR | Status: AC
  Administered 2022-02-24: 100 ug via INTRAVENOUS
  Filled 2022-02-24: qty 2

## 2022-02-24 NOTE — Progress Notes (Signed)
OT Cancellation Note ? ?Patient Details ?Name: Tamara Russell ?MRN: [id number] ?DOB: 1961/10/02 ? ? ?Cancelled Treatment:    Reason Eval/Treat Not Completed: Medical issues which prohibited therapy.  Patient intubated overnight.  OT holding, and will check early next week for appropriateness.   ? ?Nerine Pulse D Beverely Suen ?02/24/2022, 10:01 AM ?

## 2022-02-24 NOTE — Progress Notes (Signed)
Nutrition Follow-up ? ?DOCUMENTATION CODES:  ? ?Obesity unspecified ? ?INTERVENTION: ? ?Once appropriate tube placement confirmed and bowel decompressed, recommend resuming TF at trickle rate ?TF ? ?TF recommendations once able to resume:  ?Vital AF 1.2 at 55 ml/hr ?Begin at 20 ml/hr; titrate by 10 mL q 8 hours until goal rate of 55 ml/hr ?This provides 1584 kcals, 99 g of protein, 1003 mL of free water ? ?Recommend considering pro-motility agent such as reglan or erythromycin ? ? ?NUTRITION DIAGNOSIS:  ? ?Inadequate oral intake related to acute illness as evidenced by NPO status. ? ?Being addressed via TF  ? ?GOAL:  ? ?Patient will meet greater than or equal to 90% of their needs ? ?Not Met ? ?MONITOR:  ? ?Vent status, Labs, Weight trends, TF tolerance ? ?REASON FOR ASSESSMENT:  ? ?Ventilator, Other (Comment) (Cortrak Consult) ?  ? ?ASSESSMENT:  ? ?61 yo female admitted with acute psychosis/delerium, developed acute on chronic respiratory failure and required transfer to ICU and intubation.  PMH includes HTN, GERD, hx of COVID-19 infection ? ?4/25 Admitted ?5/07 Intubated ?5/08 IR for LP, Cortrak placed ?5/09 Extubated ?5/12 Re-intubated, Abd xray with as-filled, nondistended small bowel and colon throughout the partially included abdomen. ? ?Pt on vent support, sedated.  ? ?TF on hold due to vomiting, possible aspiration prior to intubation. Of note, pt with severe constipation without significant BM since 4/30 up until today ? ?During intubation, OG placed to LIS and Cortrak retracted on accident and end up being intertwined with OG. Cortrak team notified and attempted repositoning. Abd xray pending. Of note, gastric contents pouring out of Cortrak when port opened during placement but minimal out out via OG. May need to reposition OG tube for better decompression ? ?Labs: sodium 149 (H), Creatinine wdl, CBGs 135-178 ?Meds: dulcolax suppository, lactulose, colace, thiamine ? ? ? ?Diet Order:   ?Diet Order   ? ?        ?  Diet NPO time specified  Diet effective now       ?  ? ?  ?  ? ?  ? ? ?EDUCATION NEEDS:  ? ?Not appropriate for education at this time ? ?Skin:  Skin Assessment: Reviewed RN Assessment ? ?Last BM:  5/12 ? ?Height:  ? ?Ht Readings from Last 1 Encounters:  ?02/18/22 5' 1"  (1.549 m)  ? ? ?Weight:  ? ?Wt Readings from Last 1 Encounters:  ?02/24/22 98.8 kg  ? ? ? ?BMI:  Body mass index is 41.16 kg/m?. ? ?Estimated Nutritional Needs:  ? ?Kcal:  1550-1750 kcals ? ?Protein:  85-105g ? ?Fluid:  >/= 1.5 L ? ? ?Kerman Passey MS, RDN, LDN, CNSC ?Registered Dietitian III ?Clinical Nutrition ?RD Pager and On-Call Pager Number Located in [place]  ? ?

## 2022-02-24 NOTE — Progress Notes (Signed)
Cortrak Tube Team Note: ? ?Consult received to adjust pt's Cortrak feeding tube after OG placement for suction. Pt's cortrak tube coiled around OG tube, removed and replaced. Unable to advance further due to OG tube.   ? ?X-ray is required, abdominal x-ray has been ordered by the Cortrak team. Please confirm tube placement before using the Cortrak tube.  ? ?If the tube becomes dislodged please keep the tube and contact the Cortrak team at [email or web address] (password [email or web address]) for replacement.  ?If after hours and replacement cannot be delayed, place a NG tube and confirm placement with an abdominal x-ray.  ? ?Cammy Copa., RD, LDN, CNSC ?See AMiON for contact information  ? ? ?

## 2022-02-24 NOTE — Progress Notes (Signed)
Pharmacy Antibiotic Note ? ?Tamara Russell is a 61 y.o. female admitted on 02/06/2022 with altered mental status. Patient was treated earlier in admission with full course of antibioticsfor suspected UTI, but mental status did not improve. Reintubated 5/12. Patient now has concern for HAP. Pharmacy has been consulted for cefepime dosing. ? ?Tachycardic, Scr 0.91 (up), BP soft, WBC 13.4, Tm 101. Multiple cell lines are also up, patient may be hemoconcentrated. LR bolus being administered.   ?CXR 5/12 with unchanged bilateral pleural effusions   ? ?Plan: ?START Cefepime 2g IV Q8H  ?Monitor renal function, HR, BP, WBC, fever curve  ?F/U MRSA PCR, de-escalation  ? ? ?Height: 5\' 1"  (154.9 cm) ?Weight: 98.8 kg (217 lb 13 oz) ?IBW/kg (Calculated) : 47.8 ? ?Temp (24hrs), Avg:99.7 ?F (37.6 ?C), Min:98.7 ?F (37.1 ?C), Max:101 ?F (38.3 ?C) ? ?Recent Labs  ?Lab 02/20/22 ?0050 02/21/22 ?04/23/22 02/22/22 ?04/24/22 02/23/22 ?04/25/22 02/24/22 ?0323  ?WBC 8.8 8.2 10.1 8.1 13.4*  ?CREATININE 0.76 0.73 0.56 0.58 0.91  ?  ?Estimated Creatinine Clearance: 68.1 mL/min (by C-G formula based on SCr of 0.91 mg/dL).   ? ?Allergies  ?Allergen Reactions  ? Penicillins Other (See Comments)  ?  Childhood allergy. Pt does not remember reaction  ? ? ?Antimicrobials this admission: ?Cefazolin 5/1>> 5/6 ?CTX 4/21> 4/25  ?Fosfomycin po 4/26 x1 ?Cefepime 5/12>>  ? ?Dose adjustments this admission: ?N/A ? ?Microbiology results: ?4/24 urine: 40K col/mo enterooccus faecalis: pan sens ?4/24 BCx : negF  ?4/24 covid pcr neg;  Flu: neg ?(Previous PTA 4/20  UTI with E. coli, Citrobacter. Pan sens x R cipro, septra)- tx'ed w/ IV CTX and d/c'ed on keflex   ?5/12 MRSA PCR: ordered  ?5/12 TA: ordered  ? ?7/12, PharmD ?PGY-1 Acute Care Resident  ?02/24/2022 10:06 AM  ? ? ?

## 2022-02-24 NOTE — Progress Notes (Signed)
Tracheal aspirate collected and sent to lab. 

## 2022-02-24 NOTE — Progress Notes (Signed)
? ? ?NAME:  Tamara Russell, MRN:  [id number], DOB:  October 11, 1961, LOS: 24 ?ADMISSION DATE:  02/06/2022, CONSULTATION DATE:  02/18/22 ?REFERRING MD:  DR Sloan Leiter, CHIEF COMPLAINT:  acute respiratory failure  ? ?History of Present Illness:  ?61 year old woman with a history of obesity, OSA (untreated), hypertension, former tobacco use who was admitted on 02/06/2022 with encephalopathy and altered mental status.  She had been admitted earlier in April for same, found to be hypoxemic and treated empirically for possible urinary tract infection.  She never really returned to baseline.  Complicated subsequent course characterized by encephalopathy and evolving paranoia with agitated delirium.  Has received multiple psychotropic medications, potentially sedating medications and has required BiPAP for associated hypoxic and hypercapnic respiratory failure.  On at least 1 occasion she had an aspiration event, was noted to have bibasilar opacities on chest x-ray and Ancef was started for presumed aspiration.  Neurology has seen the patient, question possible autoimmune process and hoping for LP to be done under sedation by interventional radiology.  She has had trial of Zyprexa, Geodon, Haldol plus Ativan, Depakote.  Brain imaging reassuring, EEG reassuring. ?On 5/6 she experienced evolving lethargy.  ABG confirmed a significant respiratory acidosis, PCO2 93.  She will moved to the ICU for BiPAP and further care. ? ?Pertinent  Medical History  ? ?Past Medical History:  ?Diagnosis Date  ? Acid reflux   ? Complication of anesthesia   ? slow to wake up  ? History of COVID-19   ? Hypertension   ? ? ?Significant Hospital Events: ?Including procedures, antibiotic start and stop dates in addition to other pertinent events   ?5/7 intubated, eeg neg neuro consulted.  ?5/8 LP completed. Cor trak placed.  ?5/9 working on weaning sedation. Had some sedation induced hypotension, extubated, having Wild swings of agitated delirium with  psychosis, and then episodes of minimal responsiveness there was some correlation between hypotension and decreased LOC.  We did add scheduled Seroquel ?5/10 continues to have wide swings where she is agitated, and uncontrollable versus sedated ? ?Interim History / Subjective:  ?Increased work of breathing early this morning, lethargic, enough secretions to make everyone scared to try NIV. Intubated. ? ?Objective   ?Blood pressure (!) 86/76, pulse (!) 151, temperature (!) 101 ?F (38.3 ?C), temperature source Axillary, resp. rate (!) 22, height 5\' 1"  (1.549 m), weight 98.8 kg, SpO2 96 %. ?CVP:  [7 mmHg-42 mmHg] 11 mmHg  ?Vent Mode: PRVC ?FiO2 (%):  [100 %] 100 % ?Set Rate:  [22 bmp] 22 bmp ?Vt Set:  [380 mL] 380 mL ?PEEP:  [10 cmH20] 10 cmH20 ?Plateau Pressure:  [30 cmH20-31 cmH20] 30 cmH20  ? ?Intake/Output Summary (Last 24 hours) at 02/24/2022 0930 ?Last data filed at 02/24/2022 0400 ?Gross per 24 hour  ?Intake 1100 ml  ?Output 550 ml  ?Net 550 ml  ? ?Filed Weights  ? 02/22/22 0600 02/23/22 0500 02/24/22 0325  ?Weight: 99.3 kg 99.1 kg 98.8 kg  ? ? ?Examination: ?General appearance: 61 y.o., female, lethargic, chronically ill appearing ?HENT: NCAT; dry MM ?Neck: Trachea midline; no lymphadenopathy, no JVD ?Lungs: rhonchorous, with normal respiratory effort ?CV: tachy RR, no murmur  ?Abdomen: Soft, non-tender; distended, BS hypoactive ?Extremities: No peripheral edema, warm ?Neuro: drowsy and does not follow commands ? ?Labs reviewed: ?Na 149 ?S Cr fluctuating, BUN rising ?WBC 13 ? ?CXR ETT too low, order in to pull back, RLL effusion/atelectasis ? ? ? ?Resolved Hospital Problem list   ? ? ?Assessment & Plan:  ?  Principal Problem: ?  Acute metabolic encephalopathy ?Active Problems: ?  Acute respiratory failure with hypoxia and hypercapnia (HCC) ?  Hypertension ?  PUD (peptic ulcer disease) ?  Elevated troponin ?  History of UTI ?  Hypokalemia ?  Class 3 severe obesity due to excess calories with serious comorbidity and  body mass index (BMI) of 40.0 to 44.9 in adult [facility]) ?  Microcytosis ?  Diastolic dysfunction ?  Encephalopathy ? ?Acute on chronic hypoxic hypercapnic resp failure- underlying untreated OSA, prolonged hospital course, delirium. Now course c/b likely aspiration, abdominal distension leading to atelectasis and poor respiratory mechanics. ?Plan ?LTVV ?VAP bundle ?Tracheal aspirate, MRSA nare swab, start cefepime ?OG to LIWS as below ?RASS -2 ? ?Unusual psychosis/delirium/encephalopathy picture being follow by both psychiatry and neurology. Depakote had been dc'd due to supratherapeutic range. Added Seroquel 5/9 ?Plan ?Seroquel 200 twice a day ?Prn fentanyl and propofol for RASS -2 as above ? ?Ileus vs developing obstruction ?Plan ?Hold TF ?OG to LIWS ?Continue lactulose, ramp up bowel regimen ?Correct electrolytes ? ?Fluid and electrolyte imbalance: hypernatremia  ?Plan ?Enteral free water as able today, may need to restart d5 ? ?Muscular deconditioning ?Plan ?Supportive care  ? ?Dysphagia  ?Plan ?tubefeeds ? ?HTN ?Plan ?Holding meds ? ?GERD ?Plan ?PPI ? ?Best Practice (right click and "Reselect all SmartList Selections" daily)  ? ?Diet/type: TF on hold ?DVT prophylaxis: heparin ?GI prophylaxis: PPI (PTA) ?Lines: N/A ?Foley:  N/A ?Code Status:  full code ?Last date of multidisciplinary goals of care discussion [Pending] ?Family: Husband updated at bedside 5/12, remains full code ? ?Critical care time 40 minutes  ? ?Walker Shadow  ?[place] ? ? ?

## 2022-02-24 NOTE — Procedures (Signed)
Intubation Procedure Note ? ?Tamara Russell  ?[id number]  ?[birth year] ? ?Date:02/24/22  ?Time:7:53 AM  ? ?Provider Performing:Karron Goens Lemmie Evens  ? ? ?Procedure: Intubation (31500) ? ?Indication(s) ?Respiratory Failure ? ?Consent ?Risks of the procedure as well as the alternatives and risks of each were explained to the patient and/or caregiver.  Consent for the procedure was obtained and is signed in the bedside chart ? ? ?Anesthesia ?Etomidate and Rocuronium ? ? ?Time Out ?Verified patient identification, verified procedure, site/side was marked, verified correct patient position, special equipment/implants available, medications/allergies/relevant history reviewed, required imaging and test results available. ? ? ?Sterile Technique ?Usual hand hygeine, masks, and gloves were used ? ? ?Procedure Description ?Patient positioned in bed supine.  Sedation given as noted above.  Patient was intubated with endotracheal tube using Glidescope s4.  View was Grade 1 full glottis .  Number of attempts was 1.  Colorimetric CO2 detector was consistent with tracheal placement. ? ? ?Complications/Tolerance ?None; patient tolerated the procedure well. ?Chest X-ray is ordered to verify placement. ? ? ?EBL ?Minimal ? ? ?Specimen(s) ?None  ?

## 2022-02-24 NOTE — Progress Notes (Signed)
Subjective: ? ?Tamara Russell is a 61 y.o. female with PMH significant for  has a past medical history of Acid reflux, Complication of anesthesia, History of COVID-19, and Hypertension. who presents to [facility] on 04/24 with acute encephalopathy with associated hallucination and paranoia complicated by hypercarbic respiratory failure with short course of MV and s/p lumbar puncture.  Subsequently, patient developed worsening acute hypoxic respiratory failure in the setting of thick secretions.  Patient ultimately aspirated requiring reintubation and mechanical ventilation. ? ?On evaluation today, patient resting comfortably in bed on mechanical ventilation and sedated. ? ?Exam: ?Vitals:  ? 02/24/22 0807 02/24/22 0819  ?BP: (!) 86/76   ?Pulse: (!) 151   ?Resp: (!) 22   ?Temp:  (!) 101 ?F (38.3 ?C)  ?SpO2: 94% 96%  ? ?Gen: In bed, NAD ?Resp: non-labored breathing, no acute distress ?Abd: soft, nt ? ?Neuro: ?MS: Sedated and unresponsive ?[id number] intact ?Motor: unable to access ?Sensory: unable to access ? ?Pertinent Labs: ? ?  Latest Ref Rng & Units 02/24/2022  ?  4:19 AM 02/24/2022  ?  3:23 AM 02/23/2022  ?  5:19 AM  ?CBC  ?WBC 4.0 - 10.5 K/uL  13.4   8.1    ?Hemoglobin 12.0 - 15.0 g/dL 03.7   04.8   88.9    ?Hematocrit 36.0 - 46.0 % 49.0   51.1   45.2    ?Platelets 150 - 400 K/uL  288   247    ? ? ?  Latest Ref Rng & Units 02/24/2022  ?  4:19 AM 02/24/2022  ?  3:23 AM 02/23/2022  ?  5:19 AM  ?CMP  ?Glucose 70 - 99 mg/dL  169   450    ?BUN 8 - 23 mg/dL  32   18    ?Creatinine 0.44 - 1.00 mg/dL  3.88   8.28    ?Sodium 135 - 145 mmol/L 149   148   150    ?Potassium 3.5 - 5.1 mmol/L 4.5   4.7   3.7    ?Chloride 98 - 111 mmol/L  102   104    ?CO2 22 - 32 mmol/L  36   41    ?Calcium 8.9 - 10.3 mg/dL  9.5   9.0    ? ? ?  ?Tamara Russell is a 61 y.o. female with PMH significant for  has a past medical history of Acid reflux, Complication of anesthesia, History of COVID-19, and Hypertension. who presents to [facility] on 04/24 with  acute encephalopathy with associated hallucination and paranoia complicated by hypercarbic respiratory failure with short course of MV and s/p lumbar puncture. ?  ?Acute encephalopathy 2/2 to atypical delirium with psychosis versus autoimmune encephalitis   ?Patient is status post LP still waiting on results.  She developed worsening respiratory function last night requiring intubation and mechanical ventilation.  Given the clinical picture, patient may benefit from initiating treatment for autoimmune encephalitis in the near future however, patient will need to have no signs or symptoms of infection prior to initiating treatment.  At this point, patient would probably benefit from starting IVIG once infection has cleared.  Additionally, patient will need CT chest, abdomen, and pelvis with contrast while sedated for cancer screening.   ? ?Recommendations: ?  ?Acute encephalopathy 2/2 to atypical delirium with psychosis versus autoimmune encephalitis  ?- LP results without pleocytosis, waiting on serologies  ?-Continue Seroquel per primary team and psychiatry  ?-Will need CT chest/abd/pelvis w/ wo contrast while sedated and  intubated. ?- Recommend starting IVIG once infection has cleared.  ? ?Chari Manning, D.O.  ?Internal Medicine Resident, PGY-3 ?[name]  ?1:22 PM, 02/24/2022  ? ? ?

## 2022-02-24 NOTE — Progress Notes (Signed)
ETT retracted 2cm per MD order. ETT placement now 20@lip . ?

## 2022-02-24 NOTE — Progress Notes (Signed)
VO given by Dr. Thora Lance to give 100 mcg of Fentanyl to assist w/HR in the high 150s ?

## 2022-02-24 NOTE — Progress Notes (Signed)
eLink Physician-Brief Progress Note ?Patient Name: Tamara Russell ?DOB: 14-Jul-1961 ?MRN: [id number] ? ? ?Date of Service ? 02/24/2022  ?HPI/Events of Note ? Contacted by nurse for increased work of breathing.  Patient sitting up in bed tachypneic with eyes closed.  O2 sat 89% on 15 L [place]  ?eICU Interventions ? Abg and cxr now ?Will likely benefit from Bipap  ? ? ? ?Intervention Category ?Major Interventions: Respiratory failure - evaluation and management ? ?[name], P ?02/24/2022, 3:52 AM ?

## 2022-02-24 NOTE — Progress Notes (Signed)
Pt transported to CT and back to [facility] room 8 without complications. ?

## 2022-02-24 NOTE — Progress Notes (Signed)
PT Cancellation Note ? ?Patient Details ?Name: Tamara Russell ?MRN: [id number] ?DOB: 1960/11/01 ? ? ?Cancelled Treatment:    Reason Eval/Treat Not Completed: Patient not medically ready. Patient intubated overnight. Will plan to hold PT today and check back Monday as able. ? ? ?Raymond Gurney, PT, DPT ?Acute Rehabilitation Services  ?Pager: [phone] ?Office: [phone] ? ? ? ?[name] ?02/24/2022, 10:11 AM ? ? ?

## 2022-02-24 NOTE — Consult Note (Addendum)
Brief Psychiatry Consult Note  ?Patient has been seen by the psychiatry service for ~2 weeks with persistent metabolic encephalopathy (original consult for acute stress disorder); last seen yesterday.  Unfortunately pt intubated overnight, in truth has not been able to communicate with psychiatry team since 02/16/22. 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  ? ?We will sign off at this time. This has been communicated to the primary team. If issues arise in the future, don't hesitate to reconsult the Psychiatry Inpatient Consult Service.  ? ?Tamara Russell A Tamara Russell ? ?

## 2022-02-25 DIAGNOSIS — G9341 Metabolic encephalopathy: Secondary | ICD-10-CM | POA: Diagnosis not present

## 2022-02-25 LAB — CBC
HCT: 43.1 % (ref 36.0–46.0)
Hemoglobin: 12.6 g/dL (ref 12.0–15.0)
MCH: 23.1 pg — ABNORMAL LOW (ref 26.0–34.0)
MCHC: 29.2 g/dL — ABNORMAL LOW (ref 30.0–36.0)
MCV: 79.1 fL — ABNORMAL LOW (ref 80.0–100.0)
Platelets: 230 10*3/uL (ref 150–400)
RBC: 5.45 MIL/uL — ABNORMAL HIGH (ref 3.87–5.11)
RDW: 18.1 % — ABNORMAL HIGH (ref 11.5–15.5)
WBC: 14.1 10*3/uL — ABNORMAL HIGH (ref 4.0–10.5)
nRBC: 0 % (ref 0.0–0.2)

## 2022-02-25 LAB — GLUCOSE, CAPILLARY
Glucose-Capillary: 121 mg/dL — ABNORMAL HIGH (ref 70–99)
Glucose-Capillary: 133 mg/dL — ABNORMAL HIGH (ref 70–99)
Glucose-Capillary: 135 mg/dL — ABNORMAL HIGH (ref 70–99)
Glucose-Capillary: 144 mg/dL — ABNORMAL HIGH (ref 70–99)
Glucose-Capillary: 145 mg/dL — ABNORMAL HIGH (ref 70–99)
Glucose-Capillary: 150 mg/dL — ABNORMAL HIGH (ref 70–99)
Glucose-Capillary: 151 mg/dL — ABNORMAL HIGH (ref 70–99)

## 2022-02-25 LAB — BASIC METABOLIC PANEL
Anion gap: 6 (ref 5–15)
BUN: 47 mg/dL — ABNORMAL HIGH (ref 8–23)
CO2: 38 mmol/L — ABNORMAL HIGH (ref 22–32)
Calcium: 8.9 mg/dL (ref 8.9–10.3)
Chloride: 101 mmol/L (ref 98–111)
Creatinine, Ser: 1.01 mg/dL — ABNORMAL HIGH (ref 0.44–1.00)
GFR, Estimated: >60 mL/min (ref >60)
Glucose, Bld: 162 mg/dL — ABNORMAL HIGH (ref 70–99)
Potassium: 4 mmol/L (ref 3.5–5.1)
Sodium: 145 mmol/L (ref 135–145)

## 2022-02-25 LAB — LACTIC ACID, PLASMA: Lactic Acid, Venous: 1.5 mmol/L (ref 0.5–1.9)

## 2022-02-25 LAB — TRIGLYCERIDES: Triglycerides: 84 mg/dL (ref <150)

## 2022-02-25 MED ORDER — CHLORHEXIDINE GLUCONATE 0.12 % MT SOLN
OROMUCOSAL | Status: AC
  Administered 2022-02-25: 15 mL via OROMUCOSAL
  Filled 2022-02-25: qty 15

## 2022-02-25 MED ORDER — QUETIAPINE FUMARATE 50 MG PO TABS
50.0000 mg | ORAL_TABLET | Freq: Every day | ORAL | Status: DC
  Administered 2022-02-25 – 2022-02-26 (×2): 50 mg
  Filled 2022-02-25 (×2): qty 1

## 2022-02-25 MED ORDER — LACTATED RINGERS IV BOLUS
1000.0000 mL | Freq: Once | INTRAVENOUS | Status: AC
  Administered 2022-02-25: 1000 mL via INTRAVENOUS

## 2022-02-25 MED ORDER — ORAL CARE MOUTH RINSE
15.0000 mL | OROMUCOSAL | Status: DC
  Administered 2022-02-24 – 2022-02-26 (×16): 15 mL via OROMUCOSAL

## 2022-02-25 MED ORDER — CHLORHEXIDINE GLUCONATE 0.12% ORAL RINSE (MEDLINE KIT)
15.0000 mL | Freq: Two times a day (BID) | OROMUCOSAL | Status: DC
  Administered 2022-02-24 – 2022-02-26 (×3): 15 mL via OROMUCOSAL

## 2022-02-25 NOTE — Progress Notes (Signed)
Subjective: ? ?Tamara Russell is a 61 y.o. female with PMH significant for  has a past medical history of Acid reflux, Complication of anesthesia, History of COVID-19, and Hypertension. who presents to [facility] on 04/24 with acute encephalopathy with associated hallucination and paranoia complicated by hypercarbic respiratory failure with short course of MV and s/p lumbar puncture.  Subsequently, patient developed worsening acute hypoxic respiratory failure in the setting of thick secretions.  Patient ultimately aspirated requiring reintubation and mechanical ventilation. ? ?S: On evaluation today patient is on propofol but has eyes open. When she sees me walk in the room her heart rate increases, eyes widen, and she starts moving all extremities as if distressed. ? ?CT c/a/p w contrast malignancy screen ? ?1. Marked severity right middle lobe and bilateral lower lobe ?airspace disease, consistent with multifocal pneumonia. Follow-up to ?resolution is recommended. ?2. Small bowel obstruction with a suspected transition zone within ?the medial aspect of the mid to lower left abdomen. ?3. Heterogeneous mixed attenuation right adrenal mass which may ?represent an adrenal adenoma. Correlation with adrenal washout CT is ?recommended. ?4. 3.2 cm x 1.8 cm partially calcified splenic cyst versus ?hemangioma. Correlation with nonemergent splenic ultrasound is ?recommended. ?5. 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 ?6. Aortic atherosclerosis. ?7. Enlarged main pulmonary artery, which can be seen in the setting ?of pulmonary arterial hypertension. ? ?Exam: ?Vitals:  ? 02/25/22 1500 02/25/22 1600  ?BP: 126/74 122/73  ?Pulse: (!) 109 (!) 103  ?Resp: (!) 35 (!) 35  ?Temp:  98 ?F (36.7 ?C)  ?SpO2: 99% 98%  ? ?Gen: lying in bed, mild distress upon examiner walking in the room ?Resp: ventilated ?CV: RRR ?Abd: soft, nt ? ?Neuro: ?MS: on propofol but has eyes open. When she sees me walk in the room her heart rate  increases, eyes widen, and she starts moving all extremities as if distressed. ?Speech: intubated, no attempt to speak ?CN: PERRL, tracks examiner with apparent EOMI, face symmetric at rest, hearing intact to voice ?Motor: moves all 4 extremities spontaneously ?Sensory: SILT ? ?Pertinent Labs: ? ?  Latest Ref Rng & Units 02/25/2022  ?  3:41 AM 02/24/2022  ? 10:14 AM 02/24/2022  ?  4:19 AM  ?CBC  ?WBC 4.0 - 10.5 K/uL 14.1      ?Hemoglobin 12.0 - 15.0 g/dL 32.9   92.4   26.8    ?Hematocrit 36.0 - 46.0 % 43.1   45.0   49.0    ?Platelets 150 - 400 K/uL 230      ? ? ?  Latest Ref Rng & Units 02/25/2022  ?  3:41 AM 02/24/2022  ? 10:14 AM 02/24/2022  ?  4:19 AM  ?CMP  ?Glucose 70 - 99 mg/dL 341      ?BUN 8 - 23 mg/dL 47      ?Creatinine 0.44 - 1.00 mg/dL 9.62      ?Sodium 135 - 145 mmol/L 145   149   149    ?Potassium 3.5 - 5.1 mmol/L 4.0   4.5   4.5    ?Chloride 98 - 111 mmol/L 101      ?CO2 22 - 32 mmol/L 38      ?Calcium 8.9 - 10.3 mg/dL 8.9      ? ? ?  ?Tamara Russell is a 61 y.o. female with PMH significant for  has a past medical history of Acid reflux, Complication of anesthesia, History of COVID-19, and Hypertension. who presents to [facility] on 04/24  with acute encephalopathy with associated hallucination and paranoia complicated by hypercarbic respiratory failure with short course of MV and s/p lumbar puncture. ? ?Patient is status post LP still waiting on results of autoimmune antibody panels.  She developed worsening respiratory function overnight 5/11 requiring intubation and mechanical ventilation.  Her autoimmune encephalopathy panels on CSF and serum have been sent to [facility] but are not expected to result for at least another 10 days.  She cannot have any empiric immune therapy for possible autoimmune encephalitis in the setting of acute infection.  Even after her infection is cleared she may not be a candidate for high-dose steroids given the degree of agitation and psychosis that she has displayed since  admission.  After her active infections are treated we may therefore instead consider empiric treatment for possible autoimmune encephalitis with IVIG while awaiting the antibody panels to result.     ? ?CT c/a/p w contrast showed no clear evidence of malignancy, did show severe multifocal PNA, SBO, and adrenal mass favored to be adenoma (radiology recommended f/u scan with adrenal washout CT). Gen surg consulted re: SBO, recommended no emergent surgical intervention but they will continue to follow. ? ? ?Recommendations: ?  ?# Acute encephalopathy 2/2 to atypical delirium with psychosis versus autoimmune encephalitis  ?- LP results without pleocytosis, waiting on autoimmune encephalopathy panels on CSF and serum sent out to [facility] ?- Consider IVIG for empiric tx autoimmune encephalitis after infections have cleared. She will not do well with high dose steroids given her severe agitation and psychosis since admission ? ?Will continue to follow ? ?This patient is critically ill and at significant risk of neurological worsening, death and care requires constant monitoring of vital signs, hemodynamics,respiratory and cardiac monitoring, neurological assessment, discussion with family, other specialists and medical decision making of high complexity. I spent 50 minutes of neurocritical care time  in the care of  this patient. This was time spent independent of any time provided by nurse practitioner or PA. ? ?Bing Neighbors, MD ?[facility] ?[phone] ? ?If 7pm- 7am, please page neurology on call as listed in AMION. ? ? ?

## 2022-02-25 NOTE — Progress Notes (Addendum)
? ? ?NAME:  Tamara Russell, MRN:  [id number], DOB:  11-Apr-1961, LOS: 67 ?ADMISSION DATE:  02/06/2022, CONSULTATION DATE:  02/18/22 ?REFERRING MD:  DR Sloan Leiter, CHIEF COMPLAINT:  acute respiratory failure  ? ?History of Present Illness:  ?61 year old woman with a history of obesity, OSA (untreated), hypertension, former tobacco use who was admitted on 02/06/2022 with encephalopathy and altered mental status.  She had been admitted earlier in April for same, found to be hypoxemic and treated empirically for possible urinary tract infection.  She never really returned to baseline.  Complicated subsequent course characterized by encephalopathy and evolving paranoia with agitated delirium.  Has received multiple psychotropic medications, potentially sedating medications and has required BiPAP for associated hypoxic and hypercapnic respiratory failure.  On at least 1 occasion she had an aspiration event, was noted to have bibasilar opacities on chest x-ray and Ancef was started for presumed aspiration.  Neurology has seen the patient, question possible autoimmune process and hoping for LP to be done under sedation by interventional radiology.  She has had trial of Zyprexa, Geodon, Haldol plus Ativan, Depakote.  Brain imaging reassuring, EEG reassuring. ?On 5/6 she experienced evolving lethargy.  ABG confirmed a significant respiratory acidosis, PCO2 93.  She will moved to the ICU for BiPAP and further care. ? ?Pertinent  Medical History  ? ?Past Medical History:  ?Diagnosis Date  ? Acid reflux   ? Complication of anesthesia   ? slow to wake up  ? History of COVID-19   ? Hypertension   ? ? ?Significant Hospital Events: ?Including procedures, antibiotic start and stop dates in addition to other pertinent events   ?5/7 intubated, eeg neg neuro consulted.  ?5/8 LP completed. Cor trak placed.  ?5/9 working on weaning sedation. Had some sedation induced hypotension, extubated, having Wild swings of agitated delirium with  psychosis, and then episodes of minimal responsiveness there was some correlation between hypotension and decreased LOC.  We did add scheduled Seroquel ?5/10 continues to have wide swings where she is agitated, and uncontrollable versus sedated ?5/12 intubated in setting SBO and suspected aspiration ? ?Interim History / Subjective:  ?CT CAP revealing evidence of SBO but she's having bowel movements, passing flatus, abdominal exam reassuring, lactic not elevated. CCS consulted overnight. TF remain held. ? ?Objective   ?Blood pressure 100/64, pulse (!) 102, temperature 98.8 ?F (37.1 ?C), temperature source Oral, resp. rate (!) 24, height 5\' 1"  (1.549 m), weight 84.5 kg, SpO2 97 %. ?CVP:  [13 mmHg-16 mmHg] 16 mmHg  ?Vent Mode: PRVC ?FiO2 (%):  [40 %-50 %] 40 % ?Set Rate:  [22 bmp] 22 bmp ?Vt Set:  [380 mL] 380 mL ?PEEP:  [10 cmH20] 10 cmH20 ?Plateau Pressure:  [24 X5091467 cmH20] 24 cmH20  ? ?Intake/Output Summary (Last 24 hours) at 02/25/2022 0856 ?Last data filed at 02/25/2022 0800 ?Gross per 24 hour  ?Intake 3495.09 ml  ?Output 740 ml  ?Net 2755.09 ml  ? ?Filed Weights  ? 02/23/22 0500 02/24/22 0325 02/25/22 0500  ?Weight: 99.1 kg 98.8 kg 84.5 kg  ? ? ?Examination: ?General appearance: 61 y.o., female, intubated ?Lungs: rhonchorous, equal chest rise ?CV: tachy RR, no murmur  ?Abdomen: Soft, non-tender; distended but not taut, BS hypoactive ?Extremities: No peripheral edema, warm ?Neuro: deeply sedated at time of my evaluation ? ?Labs reviewed: ?Na 145 ?S Cr 1.01 ?BUN 47 ?WBC 14 ? ?CT CAP with bilateral lower lobe consolidation/atelectasis, bilateral adrenal masses, dilated small bowel loops with transition point ? ? ? ?[facility]  Problem list   ? ? ?Assessment & Plan:  ?Principal Problem: ?  Acute metabolic encephalopathy ?Active Problems: ?  Acute respiratory failure with hypoxia and hypercapnia (HCC) ?  Hypertension ?  PUD (peptic ulcer disease) ?  Elevated troponin ?  History of UTI ?  Hypokalemia ?  Class 3  severe obesity due to excess calories with serious comorbidity and body mass index (BMI) of 40.0 to 44.9 in adult [facility]) ?  Microcytosis ?  Diastolic dysfunction ?  Encephalopathy ? ?Acute on chronic hypoxic hypercapnic resp failure- underlying untreated OSA, prolonged hospital course, delirium. Now course c/b likely aspiration, abdominal distension leading to atelectasis and poor respiratory mechanics. ?Plan ?LTVV ?VAP bundle ?Tracheal aspirate, continue cefepime ?OG to LIWS as below ?RASS -1, WUA and SBT ? ?Unusual psychosis/delirium/encephalopathy picture being follow by both psychiatry and neurology. Depakote had been dc'd due to supratherapeutic range. Added Seroquel 5/9 ?Plan ?Seroquel 200 twice a day  ?Prn fentanyl and propofol for RASS -1 as above ? ?AKI ?Hypotension from sepsis, hypovolemia potentially playing roles, potentially contrast in this setting ?Plan ?another liter LR today ?avoid nephrotoxins ? ?SBO ?Plan ?Hold TF ?OG to LIWS ?CCS following appreciate assistance ?Correct electrolytes ? ?Fluid and electrolyte imbalance: hypernatremia  ?Plan ?Correct as appropriate ? ?Muscular deconditioning ?Plan ?Supportive care  ? ?Dysphagia  ?Plan ?tubefeeds ? ?HTN ?Plan ?Holding meds ? ?GERD ?Plan ?PPI ? ?Best Practice (right click and "Reselect all SmartList Selections" daily)  ? ?Diet/type: TF on hold ?DVT prophylaxis: heparin ?GI prophylaxis: PPI (PTA) ?Lines: N/A ?Foley:  N/A ?Code Status:  full code ?Last date of multidisciplinary goals of care discussion [Pending] ?Family: Husband updated at bedside 5/12, remains full code ? ?Critical care time 38 minutes  ? ?Walker Shadow  ?[place] ? ? ?

## 2022-02-25 NOTE — Progress Notes (Signed)
POC Edit sheet completed and tubed to station 12 for erroneous BS at 1614.... BS repeated at 1619.  ?

## 2022-02-25 NOTE — Progress Notes (Signed)
eLink Physician-Brief Progress Note ?Patient Name: Tamara Russell ?DOB: 1960-12-27 ?MRN: [id number] ? ? ?Date of Service ? 02/25/2022  ?HPI/Events of Note ? Ct chest, abd, pelvis completed.  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   ?Abd soft and NT on exam although MS altered.  Last BM yesterday at 1600 and was small. ?On stable low dose pressors  ?eICU Interventions ? Continue to hold tube feeds ?Continue present abx ?Discussed with surgical team who will review images and follow  ? ? ? ?Intervention Category ?Intermediate Interventions: Other: ? ?[name], P ?02/25/2022, 12:19 AM ?

## 2022-02-25 NOTE — Consult Note (Signed)
Reason for Consult:SBO ?Referring Physician: [name] ? ?Tamara Russell is an 61 y.o. female.  ?HPI: 61yo F with PMHx of obesity and OSA was admitted with AMS.  She is currently on the ventilator for likely aspiration and poor respiratory mechanics.  She also has been having delirium.  She was thought to have an ileus versus developing obstruction.  She was given lactulose.  Her last bowel movement was yesterday and was liquid according to her nurse.  She underwent CT scan of the chest abdomen pelvis for further evaluation.  This shows some dilated small bowel loops with a possible transition zone consistent with small bowel obstruction without complicating features.  I was asked to see her in consultation.  She currently is on the ventilator and cannot give history.  Her daughter in the room does report that she had a hysterectomy previously.  She has an OGT on suction which has not put out very much overnight ? ?Past Medical History:  ?Diagnosis Date  ? Acid reflux   ? Complication of anesthesia   ? slow to wake up  ? History of COVID-19   ? Hypertension   ? ? ?Past Surgical History:  ?Procedure Laterality Date  ? ABDOMINAL HYSTERECTOMY    ? BIOPSY  11/17/2018  ? Procedure: BIOPSY;  Surgeon: Beverley Fiedler, MD;  Location: [facility] ENDOSCOPY;  Service: Gastroenterology;;  ? BREAST EXCISIONAL BIOPSY Left   ? benign  ? BREAST EXCISIONAL BIOPSY Right   ? benign  ? ESOPHAGOGASTRODUODENOSCOPY (EGD) WITH PROPOFOL N/A 11/17/2018  ? Procedure: ESOPHAGOGASTRODUODENOSCOPY (EGD) WITH PROPOFOL;  Surgeon: Beverley Fiedler, MD;  Location: [facility] ENDOSCOPY;  Service: Gastroenterology;  Laterality: N/A;  ? ? ?Family History  ?Problem Relation Age of Onset  ? Cancer Father 75  ?     stomach  ? Hypertension Father   ? Diabetes Maternal Grandmother   ? Colon cancer Maternal Grandfather   ? Diabetes Paternal Grandmother   ? Colon cancer Paternal Grandmother   ? ? ?Social History:  reports that she quit smoking about 26 years ago. Her smoking  use included cigarettes. She started smoking about 46 years ago. She has a 10.00 pack-year smoking history. She has never used smokeless tobacco. She reports that she does not drink alcohol and does not use drugs. ? ?Allergies:  ?Allergies  ?Allergen Reactions  ? Penicillins Other (See Comments)  ?  Childhood allergy. Pt does not remember reaction  ? ? ?Medications: I have reviewed the patient's current medications. ? ?Results for orders placed or performed during the hospital encounter of 02/06/22 (from the past 48 hour(s))  ?Glucose, capillary     Status: Abnormal  ? Collection Time: 02/23/22  8:14 AM  ?Result Value Ref Range  ? Glucose-Capillary 150 (H) 70 - 99 mg/dL  ?  Comment: Glucose reference range applies only to samples taken after fasting for at least 8 hours.  ?Glucose, capillary     Status: Abnormal  ? Collection Time: 02/23/22 11:27 AM  ?Result Value Ref Range  ? Glucose-Capillary 157 (H) 70 - 99 mg/dL  ?  Comment: Glucose reference range applies only to samples taken after fasting for at least 8 hours.  ?Glucose, capillary     Status: Abnormal  ? Collection Time: 02/23/22  3:40 PM  ?Result Value Ref Range  ? Glucose-Capillary 140 (H) 70 - 99 mg/dL  ?  Comment: Glucose reference range applies only to samples taken after fasting for at least 8 hours.  ?Glucose, capillary  Status: Abnormal  ? Collection Time: 02/23/22  7:56 PM  ?Result Value Ref Range  ? Glucose-Capillary 164 (H) 70 - 99 mg/dL  ?  Comment: Glucose reference range applies only to samples taken after fasting for at least 8 hours.  ?Glucose, capillary     Status: Abnormal  ? Collection Time: 02/23/22 11:21 PM  ?Result Value Ref Range  ? Glucose-Capillary 168 (H) 70 - 99 mg/dL  ?  Comment: Glucose reference range applies only to samples taken after fasting for at least 8 hours.  ?CBC     Status: Abnormal  ? Collection Time: 02/24/22  3:23 AM  ?Result Value Ref Range  ? WBC 13.4 (H) 4.0 - 10.5 K/uL  ? RBC 6.30 (H) 3.87 - 5.11 MIL/uL  ?  Hemoglobin 14.5 12.0 - 15.0 g/dL  ? HCT 51.1 (H) 36.0 - 46.0 %  ? MCV 81.1 80.0 - 100.0 fL  ? MCH 23.0 (L) 26.0 - 34.0 pg  ? MCHC 28.4 (L) 30.0 - 36.0 g/dL  ? RDW 18.6 (H) 11.5 - 15.5 %  ? Platelets 288 150 - 400 K/uL  ?  Comment: REPEATED TO VERIFY  ? nRBC 0.0 0.0 - 0.2 %  ?  Comment: Performed at [facility], [address], [place], [place] [address]  ?Basic metabolic panel     Status: Abnormal  ? Collection Time: 02/24/22  3:23 AM  ?Result Value Ref Range  ? Sodium 148 (H) 135 - 145 mmol/L  ? Potassium 4.7 3.5 - 5.1 mmol/L  ?  Comment: DELTA CHECK NOTED  ? Chloride 102 98 - 111 mmol/L  ? CO2 36 (H) 22 - 32 mmol/L  ? Glucose, Bld 166 (H) 70 - 99 mg/dL  ?  Comment: Glucose reference range applies only to samples taken after fasting for at least 8 hours.  ? BUN 32 (H) 8 - 23 mg/dL  ? Creatinine, Ser 0.91 0.44 - 1.00 mg/dL  ? Calcium 9.5 8.9 - 10.3 mg/dL  ? GFR, Estimated >60 >60 mL/min  ?  Comment: (NOTE) ?Calculated using the CKD-EPI Creatinine Equation (2021) ?  ? Anion gap 10 5 - 15  ?  Comment: Performed at [facility], [address], [place], [place] [address]  ?Magnesium     Status: Abnormal  ? Collection Time: 02/24/22  3:23 AM  ?Result Value Ref Range  ? Magnesium 2.5 (H) 1.7 - 2.4 mg/dL  ?  Comment: Performed at [facility], [address] [place], [place] [address]  ?Glucose, capillary     Status: Abnormal  ? Collection Time: 02/24/22  4:02 AM  ?Result Value Ref Range  ? Glucose-Capillary 162 (H) 70 - 99 mg/dL  ?  Comment: Glucose reference range applies only to samples taken after fasting for at least 8 hours.  ?I-STAT 7, (LYTES, BLD GAS, ICA, H+H)     Status: Abnormal  ? Collection Time: 02/24/22  4:19 AM  ?Result Value Ref Range  ? pH, Arterial 7.486 (H) 7.35 - 7.45  ? pCO2 arterial 57.1 (H) 32 - 48 mmHg  ? pO2, Arterial 52 (L) 83 - 108 mmHg  ? Bicarbonate 43.0 (H) 20.0 - 28.0 mmol/L  ? TCO2 45 (H) 22 - 32 mmol/L  ? O2 Saturation 87 %  ? Acid-Base Excess 16.0 (H) 0.0 - 2.0 mmol/L   ? Sodium 149 (H) 135 - 145 mmol/L  ? Potassium 4.5 3.5 - 5.1 mmol/L  ? Calcium, Ion 1.25 1.15 -  1.40 mmol/L  ? HCT 49.0 (H) 36.0 - 46.0 %  ? Hemoglobin 16.7 (H) 12.0 - 15.0 g/dL  ? Patient temperature 99.1 F   ? Collection site RADIAL, ALLEN'S TEST ACCEPTABLE   ? Drawn by RT   ? Sample type ARTERIAL   ?Glucose, capillary     Status: Abnormal  ? Collection Time: 02/24/22  7:58 AM  ?Result Value Ref Range  ? Glucose-Capillary 158 (H) 70 - 99 mg/dL  ?  Comment: Glucose reference range applies only to samples taken after fasting for at least 8 hours.  ?Culture, Respiratory w Gram Stain     Status: None (Preliminary result)  ? Collection Time: 02/24/22  9:20 AM  ? Specimen: Tracheal Aspirate; Respiratory  ?Result Value Ref Range  ? Specimen Description TRACHEAL ASPIRATE   ? Special Requests NONE   ? Gram Stain    ?  ABUNDANT WBC PRESENT, PREDOMINANTLY PMN ?FEW GRAM VARIABLE ROD ?RARE GRAM POSITIVE COCCI ?Performed at [facility], [address] [place], [place] [address] ?  ? Culture PENDING   ? Report Status PENDING   ?I-STAT 7, (LYTES, BLD GAS, ICA, H+H)     Status: Abnormal  ? Collection Time: 02/24/22 10:14 AM  ?Result Value Ref Range  ? pH, Arterial 7.422 7.35 - 7.45  ? pCO2 arterial 64.4 (H) 32 - 48 mmHg  ? pO2, Arterial 73 (L) 83 - 108 mmHg  ? Bicarbonate 41.3 (H) 20.0 - 28.0 mmol/L  ? TCO2 43 (H) 22 - 32 mmol/L  ? O2 Saturation 92 %  ? Acid-Base Excess 14.0 (H) 0.0 - 2.0 mmol/L  ? Sodium 149 (H) 135 - 145 mmol/L  ? Potassium 4.5 3.5 - 5.1 mmol/L  ? Calcium, Ion 1.24 1.15 - 1.40 mmol/L  ? HCT 45.0 36.0 - 46.0 %  ? Hemoglobin 15.3 (H) 12.0 - 15.0 g/dL  ? Patient temperature 101.8 F   ? Collection site RADIAL, ALLEN'S TEST ACCEPTABLE   ? Drawn by RT   ? Sample type ARTERIAL   ?Glucose, capillary     Status: Abnormal  ? Collection Time: 02/24/22 11:42 AM  ?Result Value Ref Range  ? Glucose-Capillary 178 (H) 70 - 99 mg/dL  ?  Comment: Glucose reference range applies only to samples taken after fasting for at  least 8 hours.  ?MRSA Next Gen by PCR, Nasal     Status: None  ? Collection Time: 02/24/22 12:45 PM  ? Specimen: Nasal Mucosa; Nasal Swab  ?Result Value Ref Range  ? MRSA by PCR Next Gen NOT DETECTED NOT

## 2022-02-26 ENCOUNTER — Inpatient Hospital Stay (HOSPITAL_COMMUNITY): Payer: 59

## 2022-02-26 DIAGNOSIS — G9341 Metabolic encephalopathy: Secondary | ICD-10-CM | POA: Diagnosis not present

## 2022-02-26 LAB — CBC
HCT: 36.6 % (ref 36.0–46.0)
Hemoglobin: 11.2 g/dL — ABNORMAL LOW (ref 12.0–15.0)
MCH: 23.8 pg — ABNORMAL LOW (ref 26.0–34.0)
MCHC: 30.6 g/dL (ref 30.0–36.0)
MCV: 77.9 fL — ABNORMAL LOW (ref 80.0–100.0)
Platelets: 247 10*3/uL (ref 150–400)
RBC: 4.7 MIL/uL (ref 3.87–5.11)
RDW: 17.6 % — ABNORMAL HIGH (ref 11.5–15.5)
WBC: 9.6 10*3/uL (ref 4.0–10.5)
nRBC: 0 % (ref 0.0–0.2)

## 2022-02-26 LAB — BASIC METABOLIC PANEL
Anion gap: 6 (ref 5–15)
BUN: 30 mg/dL — ABNORMAL HIGH (ref 8–23)
CO2: 35 mmol/L — ABNORMAL HIGH (ref 22–32)
Calcium: 8.5 mg/dL — ABNORMAL LOW (ref 8.9–10.3)
Chloride: 101 mmol/L (ref 98–111)
Creatinine, Ser: 0.6 mg/dL (ref 0.44–1.00)
GFR, Estimated: >60 mL/min (ref >60)
Glucose, Bld: 127 mg/dL — ABNORMAL HIGH (ref 70–99)
Potassium: 3.4 mmol/L — ABNORMAL LOW (ref 3.5–5.1)
Sodium: 142 mmol/L (ref 135–145)

## 2022-02-26 LAB — GLUCOSE, CAPILLARY
Glucose-Capillary: 119 mg/dL — ABNORMAL HIGH (ref 70–99)
Glucose-Capillary: 123 mg/dL — ABNORMAL HIGH (ref 70–99)
Glucose-Capillary: 120 mg/dL — ABNORMAL HIGH (ref 70–99)
Glucose-Capillary: 104 mg/dL — ABNORMAL HIGH (ref 70–99)
Glucose-Capillary: 96 mg/dL (ref 70–99)

## 2022-02-26 LAB — CULTURE, RESPIRATORY W GRAM STAIN

## 2022-02-26 LAB — PHOSPHORUS: Phosphorus: 2.5 mg/dL (ref 2.5–4.6)

## 2022-02-26 LAB — MAGNESIUM: Magnesium: 2.2 mg/dL (ref 1.7–2.4)

## 2022-02-26 MED ORDER — CHLORHEXIDINE GLUCONATE 0.12 % MT SOLN
15.0000 mL | Freq: Two times a day (BID) | OROMUCOSAL | Status: DC
  Administered 2022-02-26 – 2022-03-17 (×36): 15 mL via OROMUCOSAL
  Filled 2022-02-26 (×31): qty 15

## 2022-02-26 MED ORDER — CEFAZOLIN SODIUM-DEXTROSE 2-4 GM/100ML-% IV SOLN
2.0000 g | Freq: Three times a day (TID) | INTRAVENOUS | Status: DC
  Administered 2022-02-26 – 2022-03-02 (×13): 2 g via INTRAVENOUS
  Filled 2022-02-26 (×14): qty 100

## 2022-02-26 MED ORDER — VITAL HIGH PROTEIN PO LIQD
1000.0000 mL | ORAL | Status: DC
  Administered 2022-02-26: 1000 mL

## 2022-02-26 MED ORDER — POTASSIUM CHLORIDE 20 MEQ PO PACK
20.0000 meq | PACK | Freq: Once | ORAL | Status: AC
  Administered 2022-02-26: 20 meq
  Filled 2022-02-26: qty 1

## 2022-02-26 MED ORDER — POTASSIUM CHLORIDE 20 MEQ PO PACK
40.0000 meq | PACK | Freq: Once | ORAL | Status: AC
  Administered 2022-02-26: 40 meq
  Filled 2022-02-26: qty 2

## 2022-02-26 MED ORDER — ORAL CARE MOUTH RINSE
15.0000 mL | Freq: Two times a day (BID) | OROMUCOSAL | Status: DC
  Administered 2022-02-26 – 2022-03-08 (×17): 15 mL via OROMUCOSAL

## 2022-02-26 NOTE — Plan of Care (Signed)
?  Problem: Clinical Measurements: ?Goal: Ability to maintain clinical measurements within normal limits will improve ?Outcome: Progressing ?Goal: Will remain free from infection ?Outcome: Progressing ?Goal: Diagnostic test results will improve ?Outcome: Progressing ?Goal: Respiratory complications will improve ?Outcome: Not Progressing ?  ?Problem: Activity: ?Goal: Risk for activity intolerance will decrease ?Outcome: Progressing ?  ?Problem: Nutrition: ?Goal: Adequate nutrition will be maintained ?Outcome: Not Progressing ?  ?Problem: Coping: ?Goal: Level of anxiety will decrease ?Outcome: Progressing ?  ?Problem: Elimination: ?Goal: Will not experience complications related to bowel motility ?Outcome: Progressing ?  ?Problem: Pain Managment: ?Goal: General experience of comfort will improve ?Outcome: Progressing ?  ?Problem: Safety: ?Goal: Ability to remain free from injury will improve ?Outcome: Progressing ?  ?Problem: Skin Integrity: ?Goal: Risk for impaired skin integrity will decrease ?Outcome: Progressing ?  ?Problem: Clinical Measurements: ?Goal: Ability to maintain clinical measurements within normal limits will improve ?Outcome: Progressing ?Goal: Will remain free from infection ?Outcome: Progressing ?Goal: Diagnostic test results will improve ?Outcome: Progressing ?Goal: Cardiovascular complication will be avoided ?Outcome: Progressing ?  ?Problem: Coping: ?Goal: Level of anxiety will decrease ?Outcome: Progressing ?  ?Problem: Elimination: ?Goal: Will not experience complications related to bowel motility ?Outcome: Progressing ?  ?Problem: Safety: ?Goal: Non-violent Restraint(s) ?Outcome: Completed/Met ?  ?Problem: Respiratory: ?Goal: Ability to maintain a clear airway and adequate ventilation will improve ?Outcome: Progressing ?  ?Problem: Role Relationship: ?Goal: Method of communication will improve ?Outcome: Progressing ?  ?

## 2022-02-26 NOTE — Procedures (Signed)
Extubation Procedure Note ? ?Patient Details:   ?Name: Tamara Russell ?DOB: [birth year] ?MRN: [id number] ?  ?Airway Documentation:  ?  ?Vent end date: 02/26/22 Vent end time: 0855  ? ?Evaluation ? O2 sats: stable throughout ?Complications: No apparent complications ?Patient did tolerate procedure well. ?Bilateral Breath Sounds: Clear, Diminished ?  ?Yes, pt could speak post extubation.  Pt extubated to 4 l/m [place] per physician's order. ? ?Audrie Lia ?02/26/2022, 8:55 AM ? ?

## 2022-02-26 NOTE — Progress Notes (Signed)
SLP Cancellation Note ? ?Patient Details ?Name: Tamara Russell ?MRN: [id number] ?DOB: 08/24/1961 ? ? ?Cancelled treatment:       Reason Eval/Treat Not Completed: Medical issues which prohibited therapy. Patient extubated just this am (second intubation) and per RN, patient with weak cough and hoarse vocal quality s/p extubation. Will plan to f/u in am to allow patient more time off vent and maximize swallowing abilities.  ? ?Ferdinand Lango MA, CCC-SLP ? ? ? ?Tamara Russell ?02/26/2022, 1:19 PM ?

## 2022-02-26 NOTE — Progress Notes (Signed)
?   02/26/22 1115  ?Clinical Encounter Type  ?Visited With Patient and family together  ?Visit Type Initial;Spiritual support  ?Referral From Nurse  ?Consult/Referral To Chaplain  ? ?Chaplain responded to a call from nurse. The patient, Tamara Russell had requested a chaplain.  ?Patient is recovering from multiple concerns and today is breathing without intubation. I spoke with the family member and to the patient. Tamara Russell is struggling to converse but is alert and is taking in what is being said. I said a short prayer of encouragement for her. Patient appreciated the visit and I will check back through the day.  ? ?Valerie Roys  ?Chaplain  ?[facility]  ?[phone]  ?

## 2022-02-26 NOTE — Progress Notes (Signed)
[facility] ADULT ICU REPLACEMENT PROTOCOL ? ? ?The patient does apply for the [facility] Adult ICU Electrolyte Replacment Protocol based on the criteria listed below:  ? ?1.Exclusion criteria: TCTS patients, ECMO patients, and Dialysis patients ?2. Is GFR >/= 30 ml/min? Yes.    ?Patient's GFR today is >60 ?3. Is SCr </= 2? Yes.   ?Patient's SCr is 0.60 mg/dL ?4. Did SCr increase >/= 0.5 in 24 hours? No. ?5.Pt's weight >40kg  Yes.   ?6. Abnormal electrolyte(s): K  ?7. Electrolytes replaced per protocol ?8.  Call MD STAT for K+ </= 2.5, Phos </= 1, or Mag </= 1 ?Physician:  Vladimir Faster ? ?Tamara Russell E Tamara Russell 02/26/2022 5:13 AM  ?

## 2022-02-26 NOTE — Progress Notes (Signed)
? ? ?NAME:  Tamara Russell, MRN:  [id number], DOB:  1960/12/16, LOS: 19 ?ADMISSION DATE:  02/06/2022, CONSULTATION DATE:  02/18/22 ?REFERRING MD:  DR Jerral Ralph, CHIEF COMPLAINT:  acute respiratory failure  ? ?History of Present Illness:  ?61 year old woman with a history of obesity, OSA (untreated), hypertension, former tobacco use who was admitted on 02/06/2022 with encephalopathy and altered mental status.  She had been admitted earlier in April for same, found to be hypoxemic and treated empirically for possible urinary tract infection.  She never really returned to baseline.  Complicated subsequent course characterized by encephalopathy and evolving paranoia with agitated delirium.  Has received multiple psychotropic medications, potentially sedating medications and has required BiPAP for associated hypoxic and hypercapnic respiratory failure.  On at least 1 occasion she had an aspiration event, was noted to have bibasilar opacities on chest x-ray and Ancef was started for presumed aspiration.  Neurology has seen the patient, question possible autoimmune process and hoping for LP to be done under sedation by interventional radiology.  She has had trial of Zyprexa, Geodon, Haldol plus Ativan, Depakote.  Brain imaging reassuring, EEG reassuring. ?On 5/6 she experienced evolving lethargy.  ABG confirmed a significant respiratory acidosis, PCO2 93.  She will moved to the ICU for BiPAP and further care. ? ?Pertinent  Medical History  ? ?Past Medical History:  ?Diagnosis Date  ? Acid reflux   ? Complication of anesthesia   ? slow to wake up  ? History of COVID-19   ? Hypertension   ? ? ?Significant Hospital Events: ?Including procedures, antibiotic start and stop dates in addition to other pertinent events   ?5/7 intubated, eeg neg neuro consulted.  ?5/8 LP completed. Cor trak placed.  ?5/9 working on weaning sedation. Had some sedation induced hypotension, extubated, having Wild swings of agitated delirium with  psychosis, and then episodes of minimal responsiveness there was some correlation between hypotension and decreased LOC.  We did add scheduled Seroquel ?5/10 continues to have wide swings where she is agitated, and uncontrollable versus sedated ?5/12 intubated in setting SBO and suspected aspiration ? ?Interim History / Subjective:  ? ?More alert this morning on SBT. Follows commands. Eager to get tube out ? ?Objective   ?Blood pressure 108/67, pulse 90, temperature 98.4 ?F (36.9 ?C), temperature source Axillary, resp. rate (!) 25, height 5\' 1"  (1.549 m), weight 97 kg, SpO2 100 %. ?   ?Vent Mode: PSV;CPAP ?FiO2 (%):  [36 %-40 %] 36 % ?Set Rate:  [22 bmp] 22 bmp ?Vt Set:  [380 mL] 380 mL ?PEEP:  [5 cmH20-8 cmH20] 5 cmH20 ?Pressure Support:  [12 cmH20] 12 cmH20 ?Plateau Pressure:  [18 cmH20-22 cmH20] 18 cmH20  ? ?Intake/Output Summary (Last 24 hours) at 02/26/2022 0935 ?Last data filed at 02/26/2022 0700 ?Gross per 24 hour  ?Intake 3523.68 ml  ?Output 955 ml  ?Net 2568.68 ml  ? ?Filed Weights  ? 02/24/22 0325 02/25/22 0500 02/26/22 0500  ?Weight: 98.8 kg 84.5 kg 97 kg  ? ? ?Examination: ?General appearance: 61 y.o., female, intubated ?Lungs: rhonchorous, equal chest rise ?CV: tachy RR, no murmur  ?Abdomen: Soft, non-tender; nondistended, BS hypoactive ?Extremities: No peripheral edema, warm ?Neuro: deeply sedated at time of my evaluation ? ?Labs reviewed: ?Chemistries, CBC stable ? ?KUB with less gaseous distension, cortrak ok to use ? ? ? ?Resolved Hospital Problem list   ? ? ?Assessment & Plan:  ?Principal Problem: ?  Acute metabolic encephalopathy ?Active Problems: ?  Acute respiratory failure with hypoxia  and hypercapnia (HCC) ?  Hypertension ?  PUD (peptic ulcer disease) ?  Elevated troponin ?  History of UTI ?  Hypokalemia ?  Class 3 severe obesity due to excess calories with serious comorbidity and body mass index (BMI) of 40.0 to 44.9 in adult [facility]) ?  Microcytosis ?  Diastolic dysfunction ?   Encephalopathy ? ?Acute on chronic hypoxic hypercapnic resp failure- underlying untreated OSA, prolonged hospital course, delirium. Now course c/b likely aspiration, abdominal distension leading to atelectasis and poor respiratory mechanics. ?Plan ?7d course ancef for klebsiella pna ?Extubate to nasal cannula, wean for O2 saturation 88-92% ?PT/mobilize ? ?Unusual psychosis/delirium/encephalopathy picture being follow by both psychiatry and neurology - Seems like this was probably metabolic encephalopathy from infections with or without a component of hospital/ICU delirium. Depakote had been dc'd due to supratherapeutic range. Improving. ?Plan ?Decrease to seroquel 50 mg at night ? ?AKI ?Hypotension from sepsis, hypovolemia potentially playing roles, potentially contrast in this setting. Resolved. ?Plan ?CTM ? ?SBO ?Belly is soft, had 2 bowel movements overnight, less distension on KUB ?Plan ?Discuss trickle feeds, bowel regimen with CCS ?CCS following appreciate assistance ?Correct electrolytes ? ?Fluid and electrolyte imbalance: hypernatremia  ?Plan ?Correct as appropriate ? ?Muscular deconditioning ?Plan ?Supportive care  ? ?Dysphagia  ?Plan ?Speech eval  ? ?HTN ?Plan ?Holding meds ? ?GERD ?Plan ?PPI ? ?Best Practice (right click and "Reselect all SmartList Selections" daily)  ? ?Diet/type: TF on hold ?DVT prophylaxis: heparin ?GI prophylaxis: PPI (PTA) ?Lines: N/A ?Foley:  removal ordered  ?Code Status:  full code ?Last date of multidisciplinary goals of care discussion [Pending] ?Family: Daughter updated at bedside 5/12, remains full code ? ?Critical care time 36 minutes  ? ?Judithann Graves  ?[place] [facility] ? ? ?

## 2022-02-26 NOTE — Progress Notes (Signed)
? ?Subjective/Chief Complaint: ?Extubated this morning. Somewhat confused. Two large bowel movements overnight and decreased gaseous distention of small bowel noted on AM films.  ? ? ?Objective: ?Vital signs in last 24 hours: ?Temp:  [98 ?F (36.7 ?C)-98.8 ?F (37.1 ?C)] 98.4 ?F (36.9 ?C) (05/14 0409) ?Pulse Rate:  [82-110] 88 (05/14 0900) ?Resp:  [18-35] 30 (05/14 0900) ?BP: (96-127)/(63-101) 119/71 (05/14 0900) ?SpO2:  [95 %-100 %] 100 % (05/14 0900) ?FiO2 (%):  [36 %-40 %] 36 % (05/14 0854) ?Weight:  [97 kg] 97 kg (05/14 0500) ?Last BM Date : 02/25/22 ? ?Intake/Output from previous day: ?05/13 0701 - 05/14 0700 ?In: 3765.9 [I.V.:2195.6; NG/GT:260; IV Piggyback:1310.3] ?Out: 1030 [Urine:780; Emesis/NG output:250] ?Intake/Output this shift: ?No intake/output data recorded. ? ?Alert, confused ?Unlabored resp ?Abd soft, minimally distended, nontender ? ?Lab Results:  ?Recent Labs  ?  02/25/22 ?3220 02/26/22 ?0400  ?WBC 14.1* 9.6  ?HGB 12.6 11.2*  ?HCT 43.1 36.6  ?PLT 230 247  ? ?BMET ?Recent Labs  ?  02/25/22 ?2542 02/26/22 ?0400  ?NA 145 142  ?K 4.0 3.4*  ?CL 101 101  ?CO2 38* 35*  ?GLUCOSE 162* 127*  ?BUN 47* 30*  ?CREATININE 1.01* 0.60  ?CALCIUM 8.9 8.5*  ? ?PT/INR ?No results for input(s): LABPROT, INR in the last 72 hours. ?ABG ?Recent Labs  ?  02/24/22 ?0419 02/24/22 ?1014  ?PHART 7.486* 7.422  ?HCO3 43.0* 41.3*  ? ? ?Studies/Results: ?DG Abd 1 View ? ?Result Date: 02/26/2022 ?CLINICAL DATA:  Evaluate NG tube placement EXAM: ABDOMEN - 1 VIEW COMPARISON:  02/26/22 FINDINGS: There is been successful post pyloric placement of the weighted feeding tube. The tip courses along the expected location of the descending duodenum and terminates at the junction between the descending and horizontal portions of the duodenum. IMPRESSION: Successful pyloric placement of weighted feeding tube. Electronically Signed   By: Signa Kell M.D.   On: 02/26/2022 09:59  ? ?CT CHEST ABDOMEN PELVIS W CONTRAST ? ?Result Date:  02/24/2022 ?CLINICAL DATA:  Concern for autoimmune encephalitis. EXAM: CT CHEST, ABDOMEN, AND PELVIS WITH CONTRAST TECHNIQUE: Multidetector CT imaging of the chest, abdomen and pelvis was performed following the standard protocol during bolus administration of intravenous contrast. RADIATION DOSE REDUCTION: This exam was performed according to the departmental dose-optimization program which includes automated exposure control, adjustment of the mA and/or kV according to patient size and/or use of iterative reconstruction technique. CONTRAST:  OMNIPAQUE IOHEXOL 300 MG/ML  SOLN COMPARISON:  September 11, 2010 FINDINGS: CT CHEST FINDINGS Cardiovascular: A right-sided PICC line is seen. There is mild calcification of the aortic arch, without evidence of aortic aneurysm or dissection. The main pulmonary artery is dilated and measures 4.1 cm. Normal heart size. No pericardial effusion. Mediastinum/Nodes: Endotracheal and nasogastric tubes are in place. No enlarged mediastinal, hilar, or axillary lymph nodes. Thyroid gland, trachea, and esophagus demonstrate no significant findings. Lungs/Pleura: Marked severity areas of airspace disease are seen within the right middle lobe and bilateral lower lobes. There is no evidence of a pleural effusion or pneumothorax. Musculoskeletal: No chest wall mass or suspicious bone lesions identified. CT ABDOMEN PELVIS FINDINGS Hepatobiliary: There is diffuse fatty infiltration of the liver parenchyma. No focal liver abnormality is seen. No gallstones, gallbladder wall thickening, or biliary dilatation. Pancreas: Unremarkable. No pancreatic ductal dilatation or surrounding inflammatory changes. Spleen: A 3.2 cm x 1.8 cm partially calcified area of low attenuation (approximately 50.96 Hounsfield units) is seen within the posteromedial aspect of the spleen. Adrenals/Urinary Tract: The right  adrenal gland is normal in appearance. A 17 mm x 11 mm low-attenuation (approximately 50.80  Hounsfield units) left adrenal mass is noted. Kidneys are normal in size, without renal calculi or hydronephrosis. A 2.0 cm x 1.8 cm heterogeneous mixed attenuation right adrenal mass is seen along the anterolateral aspect of the mid to upper right kidney. A Foley catheter is seen within the lumen of an empty urinary bladder. Stomach/Bowel: Stomach is within normal limits. Appendix appears normal. Multiple dilated small bowel loops are seen within the mid and lower abdomen (maximum small bowel diameter of approximately 3.6 cm). A transition zone is seen within the medial aspect of the mid to lower left abdomen (axial CT images 70 through 81, CT series 3). Vascular/Lymphatic: Aortic atherosclerosis. No enlarged abdominal or pelvic lymph nodes. Reproductive: Status post hysterectomy. No adnexal masses. Other: No abdominal wall hernia or abnormality. No abdominopelvic ascites. Musculoskeletal: No acute or significant osseous findings. IMPRESSION: 1. Marked severity right middle lobe and bilateral lower lobe airspace disease, consistent with multifocal pneumonia. Follow-up to resolution is recommended. 2. Small bowel obstruction with a suspected transition zone within the medial aspect of the mid to lower left abdomen. 3. Heterogeneous mixed attenuation right adrenal mass which may represent an adrenal adenoma. Correlation with adrenal washout CT is recommended. 4. 3.2 cm x 1.8 cm partially calcified splenic cyst versus hemangioma. Correlation with nonemergent splenic ultrasound is recommended. 5. 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 6. Aortic atherosclerosis. 7. Enlarged main pulmonary artery, which can be seen in the setting of pulmonary arterial hypertension. Aortic Atherosclerosis (ICD10-I70.0). Electronically Signed   By: Aram Candelahaddeus  Houston M.D.   On: 02/24/2022 23:10  ? ?DG CHEST PORT 1 VIEW ? ?Result Date: 02/26/2022 ?CLINICAL DATA:  Respiratory failure. EXAM: PORTABLE CHEST 1 VIEW COMPARISON:   02/24/2022 FINDINGS: 0545 hours. Low lung volumes. Cardiopericardial silhouette is at upper limits of normal for size. Retrocardiac left base collapse/consolidation with small left pleural effusion is similar to prior. Improved aeration at the right base with minimal subsegmental atelectasis visible today. Endotracheal tube tip is 4.3 cm above the base of the carina right PICC line tip overlies the mid SVC level. The feeding tube tip is positioned in the gastric fundus. The NG tube passes into the stomach although the distal tip position is not included on the film. Telemetry leads overlie the chest. IMPRESSION: 1. Stable retrocardiac left base collapse/consolidation with small left pleural effusion. 2. Improved aeration at the right base with minimal subsegmental atelectasis visible today. Electronically Signed   By: Kennith CenterEric  Mansell M.D.   On: 02/26/2022 08:56  ? ?DG Abd Portable 1V ? ?Result Date: 02/26/2022 ?CLINICAL DATA:  Small bowel obstruction. EXAM: PORTABLE ABDOMEN - 1 VIEW COMPARISON:  02/24/2022 FINDINGS: Feeding tube and NG tube are looped in the stomach. Mild gaseous distention of small bowel has decreased in the interval. Volume of colonic gas has also decreased. IMPRESSION: Interval decrease in gaseous distention of small bowel and colon. Electronically Signed   By: Kennith CenterEric  Mansell M.D.   On: 02/26/2022 08:57  ? ?DG Abd Portable 1V ? ?Result Date: 02/24/2022 ?CLINICAL DATA:  Feeding tube placement EXAM: PORTABLE ABDOMEN - 1 VIEW COMPARISON:  Radiograph 02/24/2022 FINDINGS: Weighted tip enteric tube tip overlies the region of the gastric fundus, advanced since the prior exam. Unchanged position of the nasogastric tube with tip overlying the left mid abdomen. Unchanged gaseous distension of small and large bowel. IMPRESSION: Feeding tube tip overlies the left upper quadrant in the region  of the gastric fundus. Unchanged nasogastric tube position with tip overlying the left mid abdomen. Unchanged gaseous  distention of small and large bowel. Electronically Signed   By: Caprice Renshaw M.D.   On: 02/24/2022 13:22   ? ?Anti-infectives: ?Anti-infectives (From admission, onward)  ? ? Start     Dose/Rate Route Frequency Ordered Stop  ?

## 2022-02-27 DIAGNOSIS — G9341 Metabolic encephalopathy: Secondary | ICD-10-CM | POA: Diagnosis not present

## 2022-02-27 DIAGNOSIS — J9601 Acute respiratory failure with hypoxia: Secondary | ICD-10-CM | POA: Diagnosis not present

## 2022-02-27 DIAGNOSIS — J9602 Acute respiratory failure with hypercapnia: Secondary | ICD-10-CM | POA: Diagnosis not present

## 2022-02-27 LAB — CBC
HCT: 38.7 % (ref 36.0–46.0)
Hemoglobin: 11.8 g/dL — ABNORMAL LOW (ref 12.0–15.0)
MCH: 23.6 pg — ABNORMAL LOW (ref 26.0–34.0)
MCHC: 30.5 g/dL (ref 30.0–36.0)
MCV: 77.4 fL — ABNORMAL LOW (ref 80.0–100.0)
Platelets: 288 10*3/uL (ref 150–400)
RBC: 5 MIL/uL (ref 3.87–5.11)
RDW: 17.7 % — ABNORMAL HIGH (ref 11.5–15.5)
WBC: 8.8 10*3/uL (ref 4.0–10.5)
nRBC: 0 % (ref 0.0–0.2)

## 2022-02-27 LAB — BASIC METABOLIC PANEL
Anion gap: 7 (ref 5–15)
BUN: 16 mg/dL (ref 8–23)
CO2: 34 mmol/L — ABNORMAL HIGH (ref 22–32)
Calcium: 8.5 mg/dL — ABNORMAL LOW (ref 8.9–10.3)
Chloride: 98 mmol/L (ref 98–111)
Creatinine, Ser: 0.52 mg/dL (ref 0.44–1.00)
GFR, Estimated: >60 mL/min (ref >60)
Glucose, Bld: 146 mg/dL — ABNORMAL HIGH (ref 70–99)
Potassium: 3.3 mmol/L — ABNORMAL LOW (ref 3.5–5.1)
Sodium: 139 mmol/L (ref 135–145)

## 2022-02-27 LAB — GLUCOSE, CAPILLARY
Glucose-Capillary: 120 mg/dL — ABNORMAL HIGH (ref 70–99)
Glucose-Capillary: 121 mg/dL — ABNORMAL HIGH (ref 70–99)
Glucose-Capillary: 123 mg/dL — ABNORMAL HIGH (ref 70–99)
Glucose-Capillary: 133 mg/dL — ABNORMAL HIGH (ref 70–99)
Glucose-Capillary: 161 mg/dL — ABNORMAL HIGH (ref 70–99)
Glucose-Capillary: >600 mg/dL (ref 70–99)
Glucose-Capillary: >600 mg/dL (ref 70–99)

## 2022-02-27 LAB — MAGNESIUM: Magnesium: 1.8 mg/dL (ref 1.7–2.4)

## 2022-02-27 MED ORDER — POTASSIUM CHLORIDE 10 MEQ/50ML IV SOLN
INTRAVENOUS | Status: AC
  Administered 2022-02-27: 10 meq
  Filled 2022-02-27: qty 50

## 2022-02-27 MED ORDER — POTASSIUM CHLORIDE 10 MEQ/100ML IV SOLN: 10.0000 meq | INTRAVENOUS | Status: DC

## 2022-02-27 MED ORDER — QUETIAPINE FUMARATE 50 MG PO TABS: 50.0000 mg | ORAL_TABLET | Freq: Every evening | ORAL | Status: DC | PRN

## 2022-02-27 MED ORDER — MELATONIN 3 MG PO TABS
3.0000 mg | ORAL_TABLET | Freq: Every evening | ORAL | Status: DC | PRN
  Administered 2022-02-27 – 2022-03-06 (×5): 3 mg via ORAL
  Filled 2022-02-27 (×5): qty 1

## 2022-02-27 MED ORDER — OSMOLITE 1.2 CAL PO LIQD
1000.0000 mL | ORAL | Status: DC
  Administered 2022-02-27: 1000 mL
  Filled 2022-02-27: qty 1000

## 2022-02-27 MED ORDER — PROSOURCE TF PO LIQD
45.0000 mL | Freq: Every day | ORAL | Status: DC
  Administered 2022-02-28 – 2022-03-01 (×2): 45 mL
  Filled 2022-02-27 (×2): qty 45

## 2022-02-27 MED ORDER — POTASSIUM CHLORIDE 10 MEQ/50ML IV SOLN
10.0000 meq | INTRAVENOUS | Status: AC
  Administered 2022-02-27 (×3): 10 meq via INTRAVENOUS
  Filled 2022-02-27 (×3): qty 50

## 2022-02-27 NOTE — Progress Notes (Signed)
? ? ?NAME:  Tamara Russell, MRN:  [id number], DOB:  May 07, 1961, LOS: 87 ?ADMISSION DATE:  02/06/2022, CONSULTATION DATE:  02/18/22 ?REFERRING MD:  DR Sloan Leiter, CHIEF COMPLAINT:  acute respiratory failure  ? ?History of Present Illness:  ?61 year old woman with a history of obesity, OSA (untreated), hypertension, former tobacco use who was admitted on 02/06/2022 with encephalopathy and altered mental status.  She had been admitted earlier in April for same, found to be hypoxemic and treated empirically for possible urinary tract infection.  She never really returned to baseline.  Complicated subsequent course characterized by encephalopathy and evolving paranoia with agitated delirium.  Has received multiple psychotropic medications, potentially sedating medications and has required BiPAP for associated hypoxic and hypercapnic respiratory failure.  On at least 1 occasion she had an aspiration event, was noted to have bibasilar opacities on chest x-ray and Ancef was started for presumed aspiration.  Neurology has seen the patient, question possible autoimmune process and hoping for LP to be done under sedation by interventional radiology.  She has had trial of Zyprexa, Geodon, Haldol plus Ativan, Depakote.  Brain imaging reassuring, EEG reassuring. ?On 5/6 she experienced evolving lethargy.  ABG confirmed a significant respiratory acidosis, PCO2 93.  She will moved to the ICU for BiPAP and further care. ? ?Pertinent  Medical History  ? ?Past Medical History:  ?Diagnosis Date  ? Acid reflux   ? Complication of anesthesia   ? slow to wake up  ? History of COVID-19   ? Hypertension   ? ? ?Significant Hospital Events: ?Including procedures, antibiotic start and stop dates in addition to other pertinent events   ?5/7 intubated, eeg neg neuro consulted.  ?5/8 LP completed. Cor trak placed.  ?5/9 working on weaning sedation. Had some sedation induced hypotension, extubated, having Wild swings of agitated delirium with  psychosis, and then episodes of minimal responsiveness there was some correlation between hypotension and decreased LOC.  We did add scheduled Seroquel ?5/10 continues to have wide swings where she is agitated, and uncontrollable versus sedated ?5/12 intubated in setting SBO and suspected aspiration ?5/15 extubated yesterday, respiratory status improving and mental status clearing  ? ?Interim History / Subjective:  ? ?Extubated successfully yesterday  ?Doing well on [place] ?Mental status improving, still intermittently confused and agitated  ? ?Objective   ?Blood pressure (!) 150/82, pulse 87, temperature 97.8 ?F (36.6 ?C), temperature source Oral, resp. rate (!) 32, height 5\' 1"  (1.549 m), weight 101 kg, SpO2 94 %. ?   ?FiO2 (%):  [36 %] 36 %  ? ?Intake/Output Summary (Last 24 hours) at 02/27/2022 0847 ?Last data filed at 02/27/2022 0700 ?Gross per 24 hour  ?Intake 2698.56 ml  ?Output 650 ml  ?Net 2048.56 ml  ? ? ?Filed Weights  ? 02/25/22 0500 02/26/22 0500 02/27/22 0600  ?Weight: 84.5 kg 97 kg 101 kg  ? ? ?General:  elderly, well-nourished F, awake, no distress ?HEENT: MM pink/moist, sclera anicteric, pupils equal  ?Neuro: awake, oriented to person and place, disoriented to situation, frustrated with questions, ready to leave, moving all extremities to command ?CV: s1s2 rrr, no m/r/g ?PULM:  clear bilaterally on [place], no rhonchi or wheezing, no distress ?GI: soft, bsx4 active  ?Extremities: warm/dry, no edema  ?Skin: no rashes or lesions ? ? ? ? ?Labs reviewed: ?K 3.3 ?Glu 146 ?Creat 0.52 ? ? ? ?Resolved Hospital Problem list   ? ?AKI ? ?Assessment & Plan:  ?Principal Problem: ?  Acute metabolic encephalopathy ?Active Problems: ?  Acute respiratory failure with hypoxia and hypercapnia (HCC) ?  Hypertension ?  PUD (peptic ulcer disease) ?  Elevated troponin ?  History of UTI ?  Hypokalemia ?  Class 3 severe obesity due to excess calories with serious comorbidity and body mass index (BMI) of 40.0 to 44.9 in adult [facility]) ?   Microcytosis ?  Diastolic dysfunction ?  Encephalopathy ? ?Acute on chronic hypoxic hypercapnic resp failure ?Suspect underlying untreated OSA, prolonged hospital course, delirium. Now course c/b likely aspiration, abdominal distension leading to atelectasis and poor respiratory mechanics. ?-intubated 5/12-5/14, extubated to [place] ?-continue 7d course ancef for klebsiella pna ?-PT/mobilize ?-stable for transfer to progressive care today ? ?Unusual psychosis/delirium/encephalopathy  ?Seen by psychiatry and neurology - most likely metabolic encephalopathy from infections with or without a component of hospital/ICU delirium.  ?Depakote was discontinued due to supratherapeutic range.  ?CTH and MRI brain both without acute abnormality ?-S/p LP  waiting on autoimmune encephalopathy panels on CSF and serum sent out to [facility], appreciate Neuro following ?-continue seroquel 50 mg at night ? ? ? ?SBO ?Belly is soft, had 2 bowel movements overnight, less distension on KUB ?Getting trickle feeds ?-no abdominal pain today ?-seen by surgery ?-continue bowel regimen ? ? ?Fluid and electrolyte imbalance ?Hypernatremia and hypokalemia ?Na 142 today ?K 3.3 ?-replete K, check mag level ? ?Muscular deconditioning ?-working with PT, stood at the bedside today ? ?Dysphagia  ?-speech eval ? ?HTN ?-resume metoprolol 12.5mg  bid ? ?GERD ?-PPI ? ?Best Practice (right click and "Reselect all SmartList Selections" daily)  ? ?Diet/type: TF ?DVT prophylaxis: heparin ?GI prophylaxis: PPI (PTA) ?Lines: N/A ?Foley:  removal ordered  ?Code Status:  full code ?Last date of multidisciplinary goals of care discussion: family updated 5/15, full code ? ? ? ?Otilio Carpen Lenise Jr, PA-C ?[facility] ?See Amion for pager ?If no response to pager , please call [phone] until 7pm ?After 7:00 pm call Elink  [phone] ? ?

## 2022-02-27 NOTE — Evaluation (Signed)
Occupational Therapy Evaluation ?Patient Details ?Name: Tamara Russell ?MRN: [id number] ?DOB: 1960-11-22 ?Today's Date: 02/27/2022 ? ? ?History of Present Illness 61 y.o. female who presents after being found altered this morning sitting on the porch. Patient had just recently been hospitalized from 4/19-4/23 after presenting for altered mental status. MRI negative; Psychiatry consult due to recent home stressors and pt with hallucinations; +delirium. Pt intubated 5/7-5/9, re-intubated 5/11-5/14 with concern for aspiration PNA and SBO. PMH significant of HTN, reformed smoker, recently hypoxic and started on home O2, and obesity.  ? ?Clinical Impression ?  ?This 61 yo female admitted then had some other medical complications so seen for a re-eval today. PTA she was totally independent, currently she is total A for all basic ADLs with +2 need for all mobility due to weakness and confusion. She will continue to benefit from acute OT with follow up at SNF still recommended.  ?   ? ?Recommendations for follow up therapy are one component of a multi-disciplinary discharge planning process, led by the attending physician.  Recommendations may be updated based on patient status, additional functional criteria and insurance authorization.  ? ?Follow Up Recommendations ? Skilled nursing-short term rehab (<3 hours/day)  ?  ?Assistance Recommended at Discharge Frequent or constant Supervision/Assistance  ?Patient can return home with the following Two people to help with walking and/or transfers;Two people to help with bathing/dressing/bathroom;Assistance with cooking/housework;Assistance with feeding;Help with stairs or ramp for entrance;Assist for transportation;Direct supervision/assist for financial management;Direct supervision/assist for medications management ? ?  ?Functional Status Assessment ? Patient has had a recent decline in their functional status and demonstrates the ability to make significant improvements in  function in a reasonable and predictable amount of time.  ?Equipment Recommendations ? Other (comment) (TBD next venue)  ?  ?   ?Precautions / Restrictions Precautions ?Precautions: Fall ?Restrictions ?Weight Bearing Restrictions: No  ? ?  ? ?Mobility Bed Mobility ?Overal bed mobility: Needs Assistance ?Bed Mobility: Supine to Sit, Sit to Supine ?  ?  ?Supine to sit: Total assist, +2 for physical assistance ?Sit to supine: +2 for physical assistance, Total assist ?  ?  ?  ? ?Transfers ?Overall transfer level: Needs assistance ?Equipment used: 2 person hand held assist ?Transfers: Sit to/from Stand ?  ?  ?  ?  ?  ?  ?General transfer comment: Attempted x2  to stand with +2 A (pt only able to barely get her buttocks off the bed) ?  ? ?  ?Balance Overall balance assessment: Needs assistance ?Sitting-balance support: Feet supported, Bilateral upper extremity supported ?Sitting balance-Leahy Scale: Poor ?Sitting balance - Comments: total A initally with posterior lean progressed to min guard A for 1-2 minutes at end of session after the 2 attempts to stand ?  ?  ?  ?  ?  ?  ?  ?  ?  ?  ?  ?  ?  ?  ?  ?   ? ?ADL either performed or assessed with clinical judgement  ? ?ADL Overall ADL's : Needs assistance/impaired ?Eating/Feeding: NPO ?  ?Grooming: Total assistance;Sitting ?  ?Upper Body Bathing: Total assistance;Cueing for UE precautions ?  ?Lower Body Bathing: Total assistance;Bed level ?  ?Upper Body Dressing : Total assistance;Sitting ?  ?Lower Body Dressing: Total assistance;Bed level ?  ?  ?  ?  ?  ?  ?  ?  ?   ? ? ? ?Vision Baseline Vision/History: 1 Wears glasses (reading) ?Ability to See in Adequate Light: 0 Adequate ?  Patient Visual Report: No change from baseline ?   ?   ?   ?   ? ?Pertinent Vitals/Pain Pain Assessment ?Pain Assessment: Faces ?Faces Pain Scale: Hurts even more ?Pain Location: bottom with moving in bed (dtr reports pressure areas that are well covered) ?Pain Descriptors / Indicators: Sore ?Pain  Intervention(s): Limited activity within patient's tolerance, Monitored during session, Repositioned  ? ? ? ?Hand Dominance Right ?  ?Extremity/Trunk Assessment Upper Extremity Assessment ?Upper Extremity Assessment: Generalized weakness ?  ?  ?  ?  ?  ?Communication Communication ?Communication: No difficulties ?  ?Cognition Arousal/Alertness: Awake/alert ?Behavior During Therapy: Anxious ?Overall Cognitive Status: Impaired/Different from baseline ?Area of Impairment: Attention, Following commands, Memory, Safety/judgement, Awareness, Problem solving ?  ?  ?  ?  ?  ?  ?  ?  ?  ?Current Attention Level: Sustained ?Memory:  (had trouble recalling how one of the people in the room was related to her.) ?Following Commands: Follows one step commands consistently, Follows one step commands with increased time ?Safety/Judgement: Decreased awareness of safety, Decreased awareness of deficits ("Let's go and get out of here" but could not even stand) ?Awareness: Intellectual ?Problem Solving: Decreased initiation, Difficulty sequencing ?General Comments: Pt was oriented x3 and knows she has been her 4 weeks ("That's why I am so weak"); but confused (men jumping out the window, kept saying she was going upstairs for surgery but no surgery is planned) ?  ?  ?   ?   ?   ? ? ?Home Living Family/patient expects to be discharged to:: Skilled nursing facility ?Living Arrangements: Spouse/significant other;Children ?Available Help at Discharge: Family;Available 24 hours/day ?Type of Home: House ?Home Access: Stairs to enter ?Entrance Stairs-Number of Steps: 2 ?Entrance Stairs-Rails: None ?  ?  ?  ?Bathroom Shower/Tub: Tub/shower unit ?  ?Bathroom Toilet: Standard (can push up from sink) ?  ?  ?Home Equipment: None ?  ?  ?  ? ?  ?Prior Functioning/Environment Prior Level of Function : Independent/Modified Independent ?  ?  ?  ?  ?  ?  ?Mobility Comments: independent prior to illness ?ADLs Comments: pt was independent PTA ?  ? ?  ?   ?OT Problem List: Decreased strength;Decreased range of motion;Decreased activity tolerance;Impaired balance (sitting and/or standing);Decreased cognition;Decreased safety awareness;Obesity;Pain ?  ?   ?OT Treatment/Interventions: Self-care/ADL training;DME and/or AE instruction;Patient/family education;Balance training;Therapeutic activities;Therapeutic exercise  ?  ?OT Goals(Current goals can be found in the care plan section) Acute Rehab OT Goals ?Patient Stated Goal: "to go upstairs for surgery" (no surgery is planned) ?OT Goal Formulation: With family ?Time For Goal Achievement: 03/13/22 ?Potential to Achieve Goals: Good  ?OT Frequency: Min 2X/week ?  ? ?Co-evaluation PT/OT/SLP Co-Evaluation/Treatment: Yes ?Reason for Co-Treatment: Complexity of the patient's impairments (multi-system involvement);Necessary to address cognition/behavior during functional activity;For patient/therapist safety;To address functional/ADL transfers ?PT goals addressed during session: Mobility/safety with mobility ?OT goals addressed during session: Strengthening/ROM ?  ? ?  ?AM-PAC OT "6 Clicks" Daily Activity     ?Outcome Measure Help from another person eating meals?: Total (NPO) ?Help from another person taking care of personal grooming?: Total ?Help from another person toileting, which includes using toliet, bedpan, or urinal?: Total ?Help from another person bathing (including washing, rinsing, drying)?: Total ?Help from another person to put on and taking off regular upper body clothing?: Total ?Help from another person to put on and taking off regular lower body clothing?: Total ?6 Click Score: 6 ?  ?End of Session  Equipment Utilized During Treatment: Oxygen (2 liters) ?Nurse Communication: Mobility status ? ?Activity Tolerance: Other (comment) (limited by anxiousness) ?Patient left: in bed;with call bell/phone within reach;with bed alarm set;with family/visitor present ? ?OT Visit Diagnosis: Unsteadiness on feet  (R26.81);Other symptoms and signs involving cognitive function;Other abnormalities of gait and mobility (R26.89);Muscle weakness (generalized) (M62.81);Pain ?Pain - part of body:  (bottom)  ?              ?Time: 083

## 2022-02-27 NOTE — Progress Notes (Addendum)
Nutrition Follow-up ? ?DOCUMENTATION CODES:  ? ?Obesity unspecified ? ?INTERVENTION:  ? ?TF via OG tube: ?Change to Osmolite 1.2 at 20 ml/h, increase by 10 ml every 8 hours to goal rate of 60 ml/h. ?Prosource TF 45 ml once daily. ? ?Provides 1768 kcal, 91 gm protein, 1181 ml free water daily. ? ?NUTRITION DIAGNOSIS:  ? ?Inadequate oral intake related to acute illness as evidenced by NPO status. ? ?Ongoing  ? ?GOAL:  ? ?Patient will meet greater than or equal to 90% of their needs ? ?Progressing  ? ?MONITOR:  ? ?Vent status, Labs, Weight trends, TF tolerance ? ?REASON FOR ASSESSMENT:  ? ?Ventilator, Other (Comment) (Cortrak Consult) ?  ? ?ASSESSMENT:  ? ?61 yo female admitted with acute psychosis/delerium, developed acute on chronic respiratory failure and required transfer to ICU and intubation.  PMH includes HTN, GERD, hx of COVID-19 infection ? ?Spoke with RN. Plans for swallow evaluation with SLP today.  ?Patient is somewhat confused and lethargic; suspect intake will be poor.  ?Patient was extubated 5/14. OG tube was removed. Cortrak remains; per x-ray 5/14, tip is between the descending and horizontal portions of the duodenum. ?Patient has been tolerating trickle TF well via postpyloric Cortrak tube.  ?Currently receiving Vital High Protein at 20 ml/h. Received consult for TF advancement. ?Surgery team has signed off; ileus has resolved and patient is having BMs. ? ?Labs reviewed. K 3.3 (receiving KCl for repletion) ?CBG: [id number] ? ?Medications reviewed and include thiamine, KCl. ?IVF: D5 at 75 ml/h.  ? ?Weight up to 101 kg today. ?Lowest weight since admission 84.5 kg (5/13). ?I/O +12 L since admission. ? ?Diet Order:   ?Diet Order   ? ? None  ? ?  ? ? ?EDUCATION NEEDS:  ? ?Not appropriate for education at this time ? ?Skin:  Skin Assessment: Skin Integrity Issues: ?Skin Integrity Issues:: Other (Comment), Incisions ?Incisions: R buttocks, L groin ?Other: MARSI R labia ? ?Last BM:  5/15 type  6 ? ?Height:  ? ?Ht Readings from Last 1 Encounters:  ?02/18/22 5\' 1"  (1.549 m)  ? ? ?Weight:  ? ?Wt Readings from Last 1 Encounters:  ?02/27/22 101 kg  ? ? ?BMI:  Body mass index is 42.07 kg/m?. ? ?Estimated Nutritional Needs:  ? ?Kcal:  1550-1750 kcals ? ?Protein:  85-105g ? ?Fluid:  >/= 1.5 L ? ? ? ?03/01/22 RD, LDN, CNSC ?Please refer to Amion for contact information.                                                       ? ?

## 2022-02-27 NOTE — Progress Notes (Signed)
Physical Therapy Treatment ?Patient Details ?Name: Tamara Russell ?MRN: [id number] ?DOB: 1961-09-15 ?Today's Date: 02/27/2022 ? ? ?History of Present Illness 61 y.o. female who presents after being found altered this morning sitting on the porch. Patient had just recently been hospitalized from 4/19-4/23 after presenting for altered mental status. MRI negative; Psychiatry consult due to recent home stressors and pt with hallucinations; +delirium. Pt intubated 5/7-5/9, re-intubated 5/11-5/14 with concern for aspiration PNA and SBO. PMH significant of HTN, reformed smoker, recently hypoxic and started on home O2, and obesity. ? ?  ?PT Comments  ? ? Patient seen in conjunction with OT for a re-evaluation after being re-intubated/extubated due to suspected aspiration PNA and SBO. Today, pt highly anxious and confused. Pt continues to hallucinate and appears internally distracted. Perseverating on "taking me upstairs" for surgery even though she is not having surgery. Oriented x3. Requires total A of 2 for bed mobility, and able to sit EOB for a few mins with close Min guard assist towards end of sitting bout. Attempted standing with assist of 2 however unable. Appears paranoid and anxious. Sp02 dropped to 85% on 2L/min 02 [place] with activity. Recommend lift for OOB. Will follow. ?  ?Recommendations for follow up therapy are one component of a multi-disciplinary discharge planning process, led by the attending physician.  Recommendations may be updated based on patient status, additional functional criteria and insurance authorization. ? ?Follow Up Recommendations ? Skilled nursing-short term rehab (<3 hours/day) ?  ?  ?Assistance Recommended at Discharge Frequent or constant Supervision/Assistance  ?Patient can return home with the following Assistance with cooking/housework;Direct supervision/assist for medications management;Direct supervision/assist for financial management;Help with stairs or ramp for entrance;Assist  for transportation;Two people to help with walking and/or transfers;A lot of help with bathing/dressing/bathroom ?  ?Equipment Recommendations ? None recommended by PT  ?  ?Recommendations for Other Services   ? ? ?  ?Precautions / Restrictions Precautions ?Precautions: Fall ?Precaution Comments: watch 02 ?Restrictions ?Weight Bearing Restrictions: No  ?  ? ?Mobility ? Bed Mobility ?Overal bed mobility: Needs Assistance ?Bed Mobility: Supine to Sit, Sit to Supine ?  ?  ?Supine to sit: Total assist, +2 for physical assistance ?Sit to supine: +2 for physical assistance, Total assist ?  ?General bed mobility comments: requiring totalA + 2 for supine <> sit ?  ? ?Transfers ?Overall transfer level: Needs assistance ?Equipment used: 2 person hand held assist ?Transfers: Sit to/from Stand ?Sit to Stand: Total assist, +2 physical assistance ?  ?  ?  ?  ?  ?General transfer comment: Attempted x2  to stand with +2 A (pt only able to barely get her buttocks off the bed). ?  ? ?Ambulation/Gait ?  ?  ?  ?  ?  ?  ?  ?General Gait Details: Unable. ? ? ?Stairs ?  ?  ?  ?  ?  ? ? ?Wheelchair Mobility ?  ? ?Modified Rankin (Stroke Patients Only) ?  ? ? ?  ?Balance Overall balance assessment: Needs assistance ?Sitting-balance support: Feet supported, Bilateral upper extremity supported ?Sitting balance-Leahy Scale: Poor ?Sitting balance - Comments: total A initally with posterior lean progressed to min guard A for 1-2 minutes at end of session after the 2 attempts to stand ?  ?Standing balance support: During functional activity ?Standing balance-Leahy Scale: Zero ?Standing balance comment: Unable to clear buttocks from bed on 2attempts ?  ?  ?  ?  ?  ?  ?  ?  ?  ?  ?  ?  ? ?  ?  Cognition Arousal/Alertness: Awake/alert ?Behavior During Therapy: Anxious ?Overall Cognitive Status: Impaired/Different from baseline ?Area of Impairment: Attention, Following commands, Memory, Safety/judgement, Awareness, Problem solving ?  ?  ?  ?  ?  ?  ?   ?  ?  ?Current Attention Level: Sustained ?Memory: Decreased short-term memory ?Following Commands: Follows one step commands consistently, Follows one step commands with increased time ?Safety/Judgement: Decreased awareness of safety, Decreased awareness of deficits ?Awareness: Intellectual ?Problem Solving: Decreased initiation, Difficulty sequencing ?General Comments: Pt was oriented x3 and knows she has been her 4 weeks ("That's why I am so weak"); but confused and hallucinating (men jumping out the window, kept saying she was going upstairs for surgery but no surgery is planned). "you do 2% of the work." Easily distracted internally. Not able to state who man was inr oom, it was her grandson but kept saying i twas her cousin/nephew. ?  ?  ? ?  ?Exercises   ? ?  ?General Comments General comments (skin integrity, edema, etc.): Daughter and grandson present. Sp02 dropped to 85% on 2L/min 02 [place] with activity. ?  ?  ? ?Pertinent Vitals/Pain Pain Assessment ?Pain Assessment: Faces ?Faces Pain Scale: Hurts even more ?Pain Location: bottom with moving in bed (dtr reports pressure areas that are well covered) ?Pain Descriptors / Indicators: Sore ?Pain Intervention(s): Limited activity within patient's tolerance, Monitored during session, Repositioned  ? ? ?Home Living Family/patient expects to be discharged to:: Skilled nursing facility ?Living Arrangements: Spouse/significant other;Children ?Available Help at Discharge: Family;Available 24 hours/day ?Type of Home: House ?Home Access: Stairs to enter ?Entrance Stairs-Rails: None ?Entrance Stairs-Number of Steps: 2 ?  ?  ?Home Equipment: None ?   ?  ?Prior Function    ?  ?  ?   ? ?PT Goals (current goals can now be found in the care plan section) Progress towards PT goals: Progressing toward goals (slowly) ? ?  ?Frequency ? ? ? Min 3X/week ? ? ? ?  ?PT Plan Current plan remains appropriate  ? ? ?Co-evaluation PT/OT/SLP Co-Evaluation/Treatment: Yes ?Reason for  Co-Treatment: Complexity of the patient's impairments (multi-system involvement);Necessary to address cognition/behavior during functional activity;For patient/therapist safety;To address functional/ADL transfers ?PT goals addressed during session: Mobility/safety with mobility ?OT goals addressed during session: Strengthening/ROM ?  ? ?  ?AM-PAC PT "6 Clicks" Mobility   ?Outcome Measure ? Help needed turning from your back to your side while in a flat bed without using bedrails?: Total ?Help needed moving from lying on your back to sitting on the side of a flat bed without using bedrails?: Total ?Help needed moving to and from a bed to a chair (including a wheelchair)?: Total ?Help needed standing up from a chair using your arms (e.g., wheelchair or bedside chair)?: Total ?Help needed to walk in hospital room?: Total ?Help needed climbing 3-5 steps with a railing? : Total ?6 Click Score: 6 ? ?  ?End of Session Equipment Utilized During Treatment: Oxygen ?Activity Tolerance: Patient tolerated treatment well ?Patient left: in bed;with call bell/phone within reach;with bed alarm set;with family/visitor present ?Nurse Communication: Mobility status;Need for lift equipment ?PT Visit Diagnosis: Other abnormalities of gait and mobility (R26.89);Muscle weakness (generalized) (M62.81) ?  ? ? ?Time: [id number] ?PT Time Calculation (min) (ACUTE ONLY): 26 min ? ?Charges:     1 Re evaluation charge        ?          ? ?Marisa Severin, PT, DPT ?Acute Rehabilitation Services ?Secure chat preferred ?Office [phone] ? ? ? ? ? ?  [place] ?02/27/2022, 11:54 AM ? ?

## 2022-02-27 NOTE — Progress Notes (Signed)
Spoke with patient's spouse Dorene Sorrow) regarding conducting personal business via telephone and/or paper.  Informed Dorene Sorrow the patient at this time is not capable of authorizing or consenting to any business or discuss POA information that may require a verbal or written consent due to the fact that Mrs. Habermann is currently confused.  Informed Laura,PA CCM of the conversation with Mr. Dorene Sorrow and she agreed.  Laura,PA CCM agreed and indicated she would also inform him that Mrs. Morgano is not in a current state to give a verbal/written consent or acknowledge and personal business.  Informed 2H leadership and Case Management as well of the situation. ?

## 2022-02-27 NOTE — Progress Notes (Signed)
? ?Subjective/Chief Complaint: ?Having bowel function ? ? ?Objective: ?Vital signs in last 24 hours: ?Temp:  [97.8 ?F (36.6 ?C)-98.5 ?F (36.9 ?C)] 97.8 ?F (36.6 ?C) (05/15 0018) ?Pulse Rate:  [75-99] 87 (05/15 0700) ?Resp:  [11-38] 32 (05/15 0700) ?BP: (119-153)/(51-111) 150/82 (05/15 0700) ?SpO2:  [82 %-100 %] 94 % (05/15 0700) ?Weight:  [101 kg] 101 kg (05/15 0600) ?Last BM Date : 02/26/22 ? ?Intake/Output from previous day: ?05/14 0701 - 05/15 0700 ?In: 2698.6 [I.V.:1956.3; NG/GT:342.2; IV Piggyback:400.1] ?Out: 650 [Urine:650] ?Intake/Output this shift: ?No intake/output data recorded. ? ?GI: soft nontender nondistended ? ?Lab Results:  ?Recent Labs  ?  02/26/22 ?0400 02/27/22 ?3976  ?WBC 9.6 8.8  ?HGB 11.2* 11.8*  ?HCT 36.6 38.7  ?PLT 247 288  ? ?BMET ?Recent Labs  ?  02/26/22 ?0400 02/27/22 ?7341  ?NA 142 139  ?K 3.4* 3.3*  ?CL 101 98  ?CO2 35* 34*  ?GLUCOSE 127* 146*  ?BUN 30* 16  ?CREATININE 0.60 0.52  ?CALCIUM 8.5* 8.5*  ? ?PT/INR ?No results for input(s): LABPROT, INR in the last 72 hours. ?ABG ?Recent Labs  ?  02/24/22 ?1014  ?PHART 7.422  ?HCO3 41.3*  ? ? ?Studies/Results: ?DG Abd 1 View ? ?Result Date: 02/26/2022 ?CLINICAL DATA:  Evaluate NG tube placement EXAM: ABDOMEN - 1 VIEW COMPARISON:  02/26/22 FINDINGS: There is been successful post pyloric placement of the weighted feeding tube. The tip courses along the expected location of the descending duodenum and terminates at the junction between the descending and horizontal portions of the duodenum. IMPRESSION: Successful pyloric placement of weighted feeding tube. Electronically Signed   By: Signa Kell M.D.   On: 02/26/2022 09:59  ? ?DG CHEST PORT 1 VIEW ? ?Result Date: 02/26/2022 ?CLINICAL DATA:  Respiratory failure. EXAM: PORTABLE CHEST 1 VIEW COMPARISON:  02/24/2022 FINDINGS: 0545 hours. Low lung volumes. Cardiopericardial silhouette is at upper limits of normal for size. Retrocardiac left base collapse/consolidation with small left pleural  effusion is similar to prior. Improved aeration at the right base with minimal subsegmental atelectasis visible today. Endotracheal tube tip is 4.3 cm above the base of the carina right PICC line tip overlies the mid SVC level. The feeding tube tip is positioned in the gastric fundus. The NG tube passes into the stomach although the distal tip position is not included on the film. Telemetry leads overlie the chest. IMPRESSION: 1. Stable retrocardiac left base collapse/consolidation with small left pleural effusion. 2. Improved aeration at the right base with minimal subsegmental atelectasis visible today. Electronically Signed   By: Kennith Center M.D.   On: 02/26/2022 08:56  ? ?DG Abd Portable 1V ? ?Result Date: 02/26/2022 ?CLINICAL DATA:  Small bowel obstruction. EXAM: PORTABLE ABDOMEN - 1 VIEW COMPARISON:  02/24/2022 FINDINGS: Feeding tube and NG tube are looped in the stomach. Mild gaseous distention of small bowel has decreased in the interval. Volume of colonic gas has also decreased. IMPRESSION: Interval decrease in gaseous distention of small bowel and colon. Electronically Signed   By: Kennith Center M.D.   On: 02/26/2022 08:57   ? ?Anti-infectives: ?Anti-infectives (From admission, onward)  ? ? Start     Dose/Rate Route Frequency Ordered Stop  ? 02/26/22 0930  ceFAZolin (ANCEF) IVPB 2g/100 mL premix       ? 2 g ?200 mL/hr over 30 Minutes Intravenous Every 8 hours 02/26/22 0846 03/03/22 0559  ? 02/24/22 1045  ceFEPIme (MAXIPIME) 2 g in sodium chloride 0.9 % 100 mL IVPB  Status:  Discontinued       ? 2 g ?200 mL/hr over 30 Minutes Intravenous Every 8 hours 02/24/22 0958 02/26/22 0846  ? 02/13/22 1400  ceFAZolin (ANCEF) IVPB 2g/100 mL premix       ? 2 g ?200 mL/hr over 30 Minutes Intravenous Every 8 hours 02/13/22 0951 02/18/22 1359  ? 02/08/22 1530  fosfomycin (MONUROL) packet 3 g       ? 3 g Oral  Once 02/08/22 1430 02/08/22 1548  ? 02/07/22 1500  cefTRIAXone (ROCEPHIN) 1 g in sodium chloride 0.9 % 100 mL IVPB   Status:  Discontinued       ? 1 g ?200 mL/hr over 30 Minutes Intravenous Every 24 hours 02/07/22 1344 02/08/22 1412  ? 02/07/22 1200  cefTRIAXone (ROCEPHIN) 1 g in sodium chloride 0.9 % 100 mL IVPB  Status:  Discontinued       ? 1 g ?200 mL/hr over 30 Minutes Intravenous Every 24 hours 02/06/22 1328 02/06/22 1729  ? 02/06/22 1800  cefTRIAXone (ROCEPHIN) 1 g in sodium chloride 0.9 % 100 mL IVPB       ? 1 g ?200 mL/hr over 30 Minutes Intravenous  Once 02/06/22 1729 02/06/22 1938  ? ?  ? ? ?Assessment/Plan: ?Likely ileus apprears resolved ?Will sign off ? ?Emelia Loron ?02/27/2022 ? ?

## 2022-02-27 NOTE — Progress Notes (Signed)
Patient unavailable for examination 2/2 undergoing extubation. Please see progress note from yesterday. Neurology will f/u again tmrw. ? ?Bing Neighbors, MD ?[facility] ?[phone] ? ?If 7pm- 7am, please page neurology on call as listed in AMION. ? ?

## 2022-02-27 NOTE — Progress Notes (Signed)
Subjective: ?Brief review of HPI: Tamara Russell is a 61 y.o. female with PMH significant for acid reflux, complication of anesthesia, History of COVID-19, and hypertension. who presented to [facility] on 04/24 with acute encephalopathy with associated hallucination and paranoia complicated by hypercarbic respiratory failure with short course of MV and s/p lumbar puncture.  Subsequently, patient developed worsening acute hypoxic respiratory failure in the setting of thick secretions.  Patient ultimately aspirated requiring reintubation and mechanical ventilation. ? ?Objective: ?Current vital signs: ?BP (!) 150/82   Pulse 87   Temp 97.8 ?F (36.6 ?C) (Oral)   Resp (!) 32   Ht 5\' 1"  (1.549 m)   Wt 101 kg   SpO2 94%   BMI 42.07 kg/m?  ?Vital signs in last 24 hours: ?Temp:  [97.8 ?F (36.6 ?C)-98.5 ?F (36.9 ?C)] 97.8 ?F (36.6 ?C) (05/15 0018) ?Pulse Rate:  [75-99] 87 (05/15 0700) ?Resp:  [11-38] 32 (05/15 0700) ?BP: (119-153)/(51-111) 150/82 (05/15 0700) ?SpO2:  [82 %-100 %] 94 % (05/15 0700) ?Weight:  [101 kg] 101 kg (05/15 0600) ? ?Intake/Output from previous day: ?05/14 0701 - 05/15 0700 ?In: 2698.6 [I.V.:1956.3; NG/GT:342.2; IV Piggyback:400.1] ?Out: 650 [Urine:650] ?Intake/Output this shift: ?No intake/output data recorded. ?Nutritional status:  ?Diet Order   ? ? None  ? ?  ? ?HEENT: [place]/AT ?Lungs: Respirations unlabored ?Ext: Warm and well perfused ? ?Neurologic Exam: ?Ment: Awake and alert. Oriented to the city, state, hospital, year and month, but not the day. Speech fluent with intact comprehension. Expresses delusional thoughts during exam. Stated that someone was seen jumping out of the window previously. Vociferously endorses a belief that what she saw was real. Not actively hallucinating during exam. Mild mood lability.  ?CN: EOMI. Face symmetric. Temp sensation intact bilaterally. Phonation intact.  ?Motor: 4+/5 BUE. Will wiggle feet to request without asymmetry noted; otherwise refuses lower extremity  motor exam.  ?Sensory: Intact to FT x 4 ?Reflexes: 1+ bilateral brachioradialis. Otherwise uncooperative ?Cerebellar: No gross ataxia noted ?Gait: Able to stand with PT assistance.  ? ?Lab Results: ?Results for orders placed or performed during the hospital encounter of 02/06/22 (from the past 48 hour(s))  ?Glucose, capillary     Status: Abnormal  ? Collection Time: 02/25/22 11:32 AM  ?Result Value Ref Range  ? Glucose-Capillary 150 (H) 70 - 99 mg/dL  ?  Comment: Glucose reference range applies only to samples taken after fasting for at least 8 hours.  ?Glucose, capillary     Status: Abnormal  ? Collection Time: 02/25/22  4:14 PM  ?Result Value Ref Range  ? Glucose-Capillary >600 (HH) 70 - 99 mg/dL  ?  Comment: Glucose reference range applies only to samples taken after fasting for at least 8 hours.  ?Glucose, capillary     Status: Abnormal  ? Collection Time: 02/25/22  4:19 PM  ?Result Value Ref Range  ? Glucose-Capillary 133 (H) 70 - 99 mg/dL  ?  Comment: Glucose reference range applies only to samples taken after fasting for at least 8 hours.  ?Glucose, capillary     Status: Abnormal  ? Collection Time: 02/25/22  7:39 PM  ?Result Value Ref Range  ? Glucose-Capillary 135 (H) 70 - 99 mg/dL  ?  Comment: Glucose reference range applies only to samples taken after fasting for at least 8 hours.  ?Glucose, capillary     Status: Abnormal  ? Collection Time: 02/25/22 11:29 PM  ?Result Value Ref Range  ? Glucose-Capillary 121 (H) 70 - 99 mg/dL  ?  Comment: Glucose reference range applies only to samples taken after fasting for at least 8 hours.  ?Basic metabolic panel     Status: Abnormal  ? Collection Time: 02/26/22  4:00 AM  ?Result Value Ref Range  ? Sodium 142 135 - 145 mmol/L  ? Potassium 3.4 (L) 3.5 - 5.1 mmol/L  ? Chloride 101 98 - 111 mmol/L  ? CO2 35 (H) 22 - 32 mmol/L  ? Glucose, Bld 127 (H) 70 - 99 mg/dL  ?  Comment: Glucose reference range applies only to samples taken after fasting for at least 8 hours.  ? BUN  30 (H) 8 - 23 mg/dL  ? Creatinine, Ser 0.60 0.44 - 1.00 mg/dL  ? Calcium 8.5 (L) 8.9 - 10.3 mg/dL  ? GFR, Estimated >60 >60 mL/min  ?  Comment: (NOTE) ?Calculated using the CKD-EPI Creatinine Equation (2021) ?  ? Anion gap 6 5 - 15  ?  Comment: Performed at [facility], [place] [address] [place], [place] [address]  ?CBC     Status: Abnormal  ? Collection Time: 02/26/22  4:00 AM  ?Result Value Ref Range  ? WBC 9.6 4.0 - 10.5 K/uL  ? RBC 4.70 3.87 - 5.11 MIL/uL  ? Hemoglobin 11.2 (L) 12.0 - 15.0 g/dL  ? HCT 36.6 36.0 - 46.0 %  ? MCV 77.9 (L) 80.0 - 100.0 fL  ? MCH 23.8 (L) 26.0 - 34.0 pg  ? MCHC 30.6 30.0 - 36.0 g/dL  ? RDW 17.6 (H) 11.5 - 15.5 %  ? Platelets 247 150 - 400 K/uL  ?  Comment: REPEATED TO VERIFY  ? nRBC 0.0 0.0 - 0.2 %  ?  Comment: Performed at [facility], [place] [address] [place], [place] [address]  ?Magnesium     Status: None  ? Collection Time: 02/26/22  4:00 AM  ?Result Value Ref Range  ? Magnesium 2.2 1.7 - 2.4 mg/dL  ?  Comment: Performed at [facility], [place] [address], [place], [place] [address]  ?Phosphorus     Status: None  ? Collection Time: 02/26/22  4:00 AM  ?Result Value Ref Range  ? Phosphorus 2.5 2.5 - 4.6 mg/dL  ?  Comment: Performed at [facility], [place] [address] [place], [place] [address]  ?Glucose, capillary     Status: Abnormal  ? Collection Time: 02/26/22  4:07 AM  ?Result Value Ref Range  ? Glucose-Capillary 119 (H) 70 - 99 mg/dL  ?  Comment: Glucose reference range applies only to samples taken after fasting for at least 8 hours.  ?Glucose, capillary     Status: Abnormal  ? Collection Time: 02/26/22  8:15 AM  ?Result Value Ref Range  ? Glucose-Capillary 123 (H) 70 - 99 mg/dL  ?  Comment: Glucose reference range applies only to samples taken after fasting for at least 8 hours.  ?Glucose, capillary     Status: Abnormal  ? Collection Time: 02/26/22 11:48 AM  ?Result Value Ref Range  ? Glucose-Capillary 120 (H) 70 - 99 mg/dL  ?  Comment: Glucose reference  range applies only to samples taken after fasting for at least 8 hours.  ?Glucose, capillary     Status: Abnormal  ? Collection Time: 02/26/22  4:51 PM  ?Result Value Ref Range  ? Glucose-Capillary 104 (H) 70 - 99 mg/dL  ?  Comment: Glucose reference range applies only to samples taken after fasting for at least 8 hours.  ?Glucose, capillary  Status: None  ? Collection Time: 02/26/22  7:41 PM  ?Result Value Ref Range  ? Glucose-Capillary 96 70 - 99 mg/dL  ?  Comment: Glucose reference range applies only to samples taken after fasting for at least 8 hours.  ?Glucose, capillary     Status: Abnormal  ? Collection Time: 02/27/22 12:08 AM  ?Result Value Ref Range  ? Glucose-Capillary 123 (H) 70 - 99 mg/dL  ?  Comment: Glucose reference range applies only to samples taken after fasting for at least 8 hours.  ?Glucose, capillary     Status: Abnormal  ? Collection Time: 02/27/22  4:23 AM  ?Result Value Ref Range  ? Glucose-Capillary 161 (H) 70 - 99 mg/dL  ?  Comment: Glucose reference range applies only to samples taken after fasting for at least 8 hours.  ?CBC     Status: Abnormal  ? Collection Time: 02/27/22  5:25 AM  ?Result Value Ref Range  ? WBC 8.8 4.0 - 10.5 K/uL  ? RBC 5.00 3.87 - 5.11 MIL/uL  ? Hemoglobin 11.8 (L) 12.0 - 15.0 g/dL  ?  Comment: REPEATED TO VERIFY ?RESULTS CONFIRMED WITH NEW SAMPLE ?  ? HCT 38.7 36.0 - 46.0 %  ? MCV 77.4 (L) 80.0 - 100.0 fL  ?  Comment: REPEATED TO VERIFY ?RESULTS CONFIRMED WITH NEW SAMPLE ?  ? MCH 23.6 (L) 26.0 - 34.0 pg  ? MCHC 30.5 30.0 - 36.0 g/dL  ? RDW 17.7 (H) 11.5 - 15.5 %  ? Platelets 288 150 - 400 K/uL  ? nRBC 0.0 0.0 - 0.2 %  ?  Comment: Performed at [facility], [place] [address], [place],  [address]  ?Basic metabolic panel     Status: Abnormal  ? Collection Time: 02/27/22  5:25 AM  ?Result Value Ref Range  ? Sodium 139 135 - 145 mmol/L  ? Potassium 3.3 (L) 3.5 - 5.1 mmol/L  ? Chloride 98 98 - 111 mmol/L  ? CO2 34 (H) 22 - 32 mmol/L  ? Glucose, Bld 146 (H)  70 - 99 mg/dL  ?  Comment: Glucose reference range applies only to samples taken after fasting for at least 8 hours.  ? BUN 16 8 - 23 mg/dL  ? Creatinine, Ser 0.52 0.44 - 1.00 mg/dL  ? Calcium 8.5 (L) 8.9 - 10.3

## 2022-02-28 DIAGNOSIS — G9341 Metabolic encephalopathy: Secondary | ICD-10-CM | POA: Diagnosis not present

## 2022-02-28 DIAGNOSIS — F05 Delirium due to known physiological condition: Secondary | ICD-10-CM | POA: Diagnosis not present

## 2022-02-28 DIAGNOSIS — I5189 Other ill-defined heart diseases: Secondary | ICD-10-CM | POA: Diagnosis not present

## 2022-02-28 DIAGNOSIS — J9601 Acute respiratory failure with hypoxia: Secondary | ICD-10-CM | POA: Diagnosis not present

## 2022-02-28 DIAGNOSIS — J9602 Acute respiratory failure with hypercapnia: Secondary | ICD-10-CM | POA: Diagnosis not present

## 2022-02-28 LAB — MISC LABCORP TEST (SEND OUT): Labcorp test code: 9985

## 2022-02-28 LAB — GLUCOSE, CAPILLARY
Glucose-Capillary: >600 mg/dL (ref 70–99)
Glucose-Capillary: 120 mg/dL — ABNORMAL HIGH (ref 70–99)
Glucose-Capillary: 123 mg/dL — ABNORMAL HIGH (ref 70–99)
Glucose-Capillary: 136 mg/dL — ABNORMAL HIGH (ref 70–99)
Glucose-Capillary: 129 mg/dL — ABNORMAL HIGH (ref 70–99)
Glucose-Capillary: 144 mg/dL — ABNORMAL HIGH (ref 70–99)
Glucose-Capillary: 148 mg/dL — ABNORMAL HIGH (ref 70–99)

## 2022-02-28 NOTE — Consult Note (Addendum)
[facility] Face-to-Face Psychiatry Consult  ? ?Reason for Consult:  AMS and psychosis ?Referring Physician:  Dr. Caleb Popp ?Patient Identification: Tamara Russell ?MRN:  [id number] ?Principal Diagnosis: Acute metabolic encephalopathy ?Diagnosis:  Principal Problem: ?  Acute metabolic encephalopathy ?Active Problems: ?  Acute respiratory failure with hypoxia and hypercapnia (HCC) ?  Hypertension ?  PUD (peptic ulcer disease) ?  Elevated troponin ?  History of UTI ?  Hypokalemia ?  Class 3 severe obesity due to excess calories with serious comorbidity and body mass index (BMI) of 40.0 to 44.9 in adult [facility]) ?  Microcytosis ?  Diastolic dysfunction ?  Encephalopathy ? ? ?Total Time spent with patient: 30 minutes ? ?Subjective:   ?Tamara Russell is a 61 y.o. female patient  Admitted on 02/06/2022 for altered mental status and psychosis. Psychiatry consulted for AMS and psychosis.  ? ?Today upon evaluation patient is appropriate and alert. She appears to be engaging well with staff, husband and Clinical research associate. She acknowledges coming to the hospital and being really sick.  She is very talkative today, and at times seems to be paranoid.  At 1 time during the evaluation she reported "my sister owns [facility].  Do not tell anyone.  I do not want anyone to know.  "  During this time husband was present in addition to attending psychiatrist.  Patient is also observed to be looking around the room, although she maintain linear thought processes and lucidness.  Clinically she appears to have improved in terms of acute psychosis, agitation, hallucinations, and paranoia prior to 1 week ago.  She does allow providers to hold her hands and leaning over inside the bed.  Patient is also alert and oriented x4, has great memory recall.  She is able to state the days of the week backwards with some prompting.  Overall patient reports much improvement of symptoms at this time as evident by her interaction with team, decrease in psychosis  Symptoms and anxiety.    She denies any suicidal thoughts, homicidal thoughts.  She does endorse visual hallucinations of bugs in the ceiling and in her bed at nighttime only. ?It should be noted that are dead lives in her lighting fixture, that she is referring to.  ? ?Collateral ?Additional collateral from husband: Patient has been sleeping better.  She enjoyed having her sister's visit "girls night out ."  ? ?HPI :   ?Tamara Russell is a 61 y.o. female patient  Admitted on 02/06/2022 for altered mental status and psychosis. Psychiatry consulted for AMS and psychosis.  ? ? ? ?Past Medical History:  ?Past Medical History:  ?Diagnosis Date  ?? Acid reflux   ?? Complication of anesthesia   ? slow to wake up  ?? History of COVID-19   ?? Hypertension   ?  ?Past Surgical History:  ?Procedure Laterality Date  ?? ABDOMINAL HYSTERECTOMY    ?? BIOPSY  11/17/2018  ? Procedure: BIOPSY;  Surgeon: Beverley Fiedler, MD;  Location: [facility] ENDOSCOPY;  Service: Gastroenterology;;  ?? BREAST EXCISIONAL BIOPSY Left   ? benign  ?? BREAST EXCISIONAL BIOPSY Right   ? benign  ?? ESOPHAGOGASTRODUODENOSCOPY (EGD) WITH PROPOFOL N/A 11/17/2018  ? Procedure: ESOPHAGOGASTRODUODENOSCOPY (EGD) WITH PROPOFOL;  Surgeon: Beverley Fiedler, MD;  Location: [facility] ENDOSCOPY;  Service: Gastroenterology;  Laterality: N/A;  ? ?Family History:  ?Family History  ?Problem Relation Age of Onset  ?? Cancer Father 22  ?     stomach  ?? Hypertension Father   ?? Diabetes  Maternal Grandmother   ?? Colon cancer Maternal Grandfather   ?? Diabetes Paternal Grandmother   ?? Colon cancer Paternal Grandmother   ? ?Family history ?No family psychiatric history disclosed.  ?The patient's family history includes Cancer (age of onset: 28) in her father; Colon cancer in her maternal grandfather and paternal grandmother; Diabetes in her maternal grandmother and paternal grandmother; Hypertension in her father. ?  ? ? ?Social History:  ?Social History  ? ?Substance and Sexual Activity   ?Alcohol Use No  ?? Alcohol/week: 0.0 standard drinks  ?   ?Social History  ? ?Substance and Sexual Activity  ?Drug Use No  ?  ?Social History  ? ?Socioeconomic History  ?? Marital status: Married  ?  Spouse name: Not on file  ?? Number of children: Not on file  ?? Years of education: Not on file  ?? Highest education level: Not on file  ?Occupational History  ?? Occupation: unemployed  ?Tobacco Use  ?? Smoking status: Former  ?  Packs/day: 0.50  ?  Years: 20.00  ?  Pack years: 10.00  ?  Types: Cigarettes  ?  Start date: 04/30/1975  ?  Quit date: 04/30/1995  ?  Years since quitting: 26.8  ?? Smokeless tobacco: Never  ?Vaping Use  ?? Vaping Use: Never used  ?Substance and Sexual Activity  ?? Alcohol use: No  ?  Alcohol/week: 0.0 standard drinks  ?? Drug use: No  ?? Sexual activity: Not on file  ?Other Topics Concern  ?? Not on file  ?Social History Narrative  ?? Not on file  ? ?Social Determinants of Health  ? ?Financial Resource Strain: Not on file  ?Food Insecurity: Not on file  ?Transportation Needs: Not on file  ?Physical Activity: Not on file  ?Stress: Not on file  ?Social Connections: Not on file  ? ?Additional Social History: ?  ? ?Allergies:   ?Allergies  ?Allergen Reactions  ?? Penicillins Other (See Comments)  ?  Childhood allergy. Pt does not remember reaction ?Tolerated cefazolin 5/1-5/6, cefepime 5/12-5/14  ? ? ?Labs:  ?Results for orders placed or performed during the hospital encounter of 02/06/22 (from the past 48 hour(s))  ?Glucose, capillary     Status: Abnormal  ? Collection Time: 02/26/22  4:51 PM  ?Result Value Ref Range  ? Glucose-Capillary 104 (H) 70 - 99 mg/dL  ?  Comment: Glucose reference range applies only to samples taken after fasting for at least 8 hours.  ?Glucose, capillary     Status: None  ? Collection Time: 02/26/22  7:41 PM  ?Result Value Ref Range  ? Glucose-Capillary 96 70 - 99 mg/dL  ?  Comment: Glucose reference range applies only to samples taken after fasting for at least 8  hours.  ?Glucose, capillary     Status: Abnormal  ? Collection Time: 02/27/22 12:08 AM  ?Result Value Ref Range  ? Glucose-Capillary 123 (H) 70 - 99 mg/dL  ?  Comment: Glucose reference range applies only to samples taken after fasting for at least 8 hours.  ?Glucose, capillary     Status: Abnormal  ? Collection Time: 02/27/22  4:23 AM  ?Result Value Ref Range  ? Glucose-Capillary 161 (H) 70 - 99 mg/dL  ?  Comment: Glucose reference range applies only to samples taken after fasting for at least 8 hours.  ?CBC     Status: Abnormal  ? Collection Time: 02/27/22  5:25 AM  ?Result Value Ref Range  ? WBC 8.8 4.0 - 10.5 K/uL  ?  RBC 5.00 3.87 - 5.11 MIL/uL  ? Hemoglobin 11.8 (L) 12.0 - 15.0 g/dL  ?  Comment: REPEATED TO VERIFY ?RESULTS CONFIRMED WITH NEW SAMPLE ?  ? HCT 38.7 36.0 - 46.0 %  ? MCV 77.4 (L) 80.0 - 100.0 fL  ?  Comment: REPEATED TO VERIFY ?RESULTS CONFIRMED WITH NEW SAMPLE ?  ? MCH 23.6 (L) 26.0 - 34.0 pg  ? MCHC 30.5 30.0 - 36.0 g/dL  ? RDW 17.7 (H) 11.5 - 15.5 %  ? Platelets 288 150 - 400 K/uL  ? nRBC 0.0 0.0 - 0.2 %  ?  Comment: Performed at [facility], [address] [place], [place] [address]  ?Basic metabolic panel     Status: Abnormal  ? Collection Time: 02/27/22  5:25 AM  ?Result Value Ref Range  ? Sodium 139 135 - 145 mmol/L  ? Potassium 3.3 (L) 3.5 - 5.1 mmol/L  ? Chloride 98 98 - 111 mmol/L  ? CO2 34 (H) 22 - 32 mmol/L  ? Glucose, Bld 146 (H) 70 - 99 mg/dL  ?  Comment: Glucose reference range applies only to samples taken after fasting for at least 8 hours.  ? BUN 16 8 - 23 mg/dL  ? Creatinine, Ser 0.52 0.44 - 1.00 mg/dL  ? Calcium 8.5 (L) 8.9 - 10.3 mg/dL  ? GFR, Estimated >60 >60 mL/min  ?  Comment: (NOTE) ?Calculated using the CKD-EPI Creatinine Equation (2021) ?  ? Anion gap 7 5 - 15  ?  Comment: Performed at [facility], [address] [place], [place] [address]  ?Glucose, capillary     Status: Abnormal  ? Collection Time: 02/27/22  9:17 AM  ?Result Value Ref Range  ?  Glucose-Capillary >600 (HH) 70 - 99 mg/dL  ?  Comment: Glucose reference range applies only to samples taken after fasting for at least 8 hours.  ?Glucose, capillary     Status: Abnormal  ? Collection Time: 02/27/22  9:18

## 2022-02-28 NOTE — Progress Notes (Signed)
? ?PROGRESS NOTE ? ? ? ?Tamara Russell  [id number] DOB: [birth year] DOA: 02/06/2022 ?PCP: Lucianne Lei, MD ? ? ?Brief Narrative: ?Patient is a 61 y.o.  female morbid obesity, HTN, GERD-who was just discharged from this facility on 4/23-presented to the hospital on 4/24 with altered mental status. ? ?4/19-4/23>> hospitalization for chest pain, confusion-UTI.  Per notes mental status stable at discharge. ?4/24> presented to the ED for confusion-admit to [facility]. ?4/28>>Neuro eval-marital stress possibly causing AMS-psych consult recommended ?5/01>> episode of aspiration-bradycardia/worsening hypoxemia-improved after suctioning.CXR with bibasilar opacities-IV Ancef started.  BiPAP held ?5/04>>Neuro re-eval per psych rec's-?autoimmune process causing AMS ?5/05>> trial of Depakote per neurology.  LP planned under sedation on 5/6 ?5/06>> significant sedation/lethargy following Haldol/Ativan-concern for hypercarbia-ABG difficult to perform-BiPAP started.  LP postponed.  Haldol/Ativan dosage adjusted by psychiatry. ?5/7 intubated, eeg neg neuro consulted.  ?5/8 LP completed. Cor trak placed.  ?5/9 working on weaning sedation. Had some sedation induced hypotension, extubated, having Wild swings of agitated delirium with psychosis, and then episodes of minimal responsiveness there was some correlation between hypotension and decreased LOC.  We did add scheduled Seroquel ?5/10 continues to have wide swings where she is agitated, and uncontrollable versus sedated ?5/12 intubated in setting SBO and suspected aspiration ?5/15 extubated yesterday, respiratory status improving and mental status clearing ? ?Assessment and Plan: ? ?Acute metabolic encephalopathy ?Atypical delirium with psychosis ?Psychiatry and neurology consulted. Multiple EEGs without seizure. Concern for possible metabolic encephalopathy as main etiology secondary to infections in addition to overall hospitalization. LP obtained without evidence of pleocytosis;  autoimmune encephalopathy panel obtained and is pending. Neurology considering IVIG for empiric treatment of autoimmune encephalitis after infections have cleared. Psychiatry recommending no changes to current medications; per their assessment, patient does not have capacity to make medical decisions. ?-Continue neurology/psychiatry recommendations ?-Delirium precautions ?-Continue thiamine ?-Continue Seroquel 50 mg qHS prn ? ?Aspiration pneumonia ?Multiple episodes of aspiration. Klebsiella isolated on tracheal aspirate. ?-Continue Cefazolin ? ?Small bowel obstruction ?Patient evaluated by general surgery. Per general surgery, more likely ileus. Signed off. Patient's symptoms appear to have resolved. Having bowel movements. ? ?Acute on chronic respiratory failure with hypoxia and hypercapnia ?Per family, oxygen is a new requirement from previous admission. Patient was intubated twice this admission secondary to aspiration issues. Now on supplemental oxygen via nasal canula. ?-Wean room air ? ?Dysphagia ?Secondary to critical illness. Cortrak placed and patient is currently on tube feeds. ?-Continue tube feeds ?-SLP recommendations for advancement of diet ? ?GERD ?-Continue Protonix ? ?OSA ?Initially on BiPAP which was discontinued secondary to aspiration. ? ?Hypernatremia ?Resolved. ? ?Chronic diastolic heart failure ?Stable. Euvolemic. ? ?Primary hypertension ?-Continue metoprolol ? ?Hyperlipidemia ?-Continue Lipitor ? ?Asymptomatic bacteruria ?Treated with fosfomycin ? ?Morbid obesity ?Body mass index is 42.07 kg/m?. ? ? ?DVT prophylaxis: Heparin subq ?Code Status:   Code Status: Full Code ?Family Communication: Husband and daughter at bedside ?Disposition Plan: Discharge to SNF pending ability to take oral nutrition, specialist sign off recommendations ? ? ?Consultants:  ?PCCM ?Neurology ?Psychiatry ?General surgery ? ?Procedures:  ?EEG (4/26; 5/7) ?ETT (5/6 >> 5/9; 5/12 >> 5/14) ?Cortrak (5/8) ?Lumbar puncture  (5/8) ? ?Antimicrobials: ?Cefepime ?Cefazolin ?Ceftriaxone ?Fosfomycin ? ? ?Subjective: ?Patient reports no issues. Eager to eat by mouth today. Does not think she will have issues with swallowing. ? ?Objective: ?BP 136/88 (BP Location: Left Arm)   Pulse 90   Temp 98.4 ?F (36.9 ?C) (Oral)   Resp 17   Ht 5\' 1"  (1.549 m)   Wt 101  kg   SpO2 94%   BMI 42.07 kg/m?  ? ?Examination: ? ?General exam: Appears calm and comfortable ?Respiratory system: Clear to auscultation. Respiratory effort normal. ?Cardiovascular system: S1 & S2 heard, RRR. No murmurs. ?Gastrointestinal system: Abdomen is nondistended, soft and nontender. Normal bowel sounds heard. ?Central nervous system: Alert and oriented. No focal neurological deficits. ?Musculoskeletal: No edema. No calf tenderness ?Skin: No cyanosis. No rashes ? ? ?Data Reviewed: I have personally reviewed following labs and imaging studies ? ?CBC ?Lab Results  ?Component Value Date  ? WBC 8.8 02/27/2022  ? RBC 5.00 02/27/2022  ? HGB 11.8 (L) 02/27/2022  ? HCT 38.7 02/27/2022  ? MCV 77.4 (L) 02/27/2022  ? MCH 23.6 (L) 02/27/2022  ? PLT 288 02/27/2022  ? MCHC 30.5 02/27/2022  ? RDW 17.7 (H) 02/27/2022  ? LYMPHSABS 1.2 02/13/2022  ? MONOABS 0.7 02/13/2022  ? EOSABS 0.0 02/13/2022  ? BASOSABS 0.1 02/13/2022  ? ? ? ?Last metabolic panel ?Lab Results  ?Component Value Date  ? NA 139 02/27/2022  ? K 3.3 (L) 02/27/2022  ? CL 98 02/27/2022  ? CO2 34 (H) 02/27/2022  ? BUN 16 02/27/2022  ? CREATININE 0.52 02/27/2022  ? GLUCOSE 146 (H) 02/27/2022  ? GFRNONAA >60 02/27/2022  ? GFRAA >60 12/03/2019  ? CALCIUM 8.5 (L) 02/27/2022  ? PHOS 2.5 02/26/2022  ? PROT 7.3 02/13/2022  ? ALBUMIN 3.3 (L) 02/13/2022  ? BILITOT 0.5 02/13/2022  ? ALKPHOS 51 02/13/2022  ? AST 28 02/13/2022  ? ALT 22 02/13/2022  ? ANIONGAP 7 02/27/2022  ? ? ?GFR: ?Estimated Creatinine Clearance: 78.5 mL/min (by C-G formula based on SCr of 0.52 mg/dL). ? ?Recent Results (from the past 240 hour(s))  ?CSF culture w Gram Stain      Status: None  ? Collection Time: 02/20/22  2:26 PM  ? Specimen: PATH Cytology CSF; Cerebrospinal Fluid  ?Result Value Ref Range Status  ? Specimen Description CSF  Final  ? Special Requests NONE  Final  ? Gram Stain   Final  ?  WBC PRESENT, PREDOMINANTLY MONONUCLEAR ?NO ORGANISMS SEEN ?CYTOSPIN SMEAR ?  ? Culture   Final  ?  NO GROWTH 3 DAYS ?Performed at [facility], [place] [address], [place], [place] [address] ?  ? Report Status 02/23/2022 FINAL  Final  ?Culture, Respiratory w Gram Stain     Status: None  ? Collection Time: 02/24/22  9:20 AM  ? Specimen: Tracheal Aspirate; Respiratory  ?Result Value Ref Range Status  ? Specimen Description TRACHEAL ASPIRATE  Final  ? Special Requests NONE  Final  ? Gram Stain   Final  ?  ABUNDANT WBC PRESENT, PREDOMINANTLY PMN ?FEW GRAM VARIABLE ROD ?RARE GRAM POSITIVE COCCI ?  ? Culture   Final  ?  ABUNDANT KLEBSIELLA PNEUMONIAE ?NO STAPHYLOCOCCUS AUREUS ISOLATED ?No Pseudomonas species isolated ?Performed at [facility], [place] [address], [place], [place] [address] ?  ? Report Status 02/26/2022 FINAL  Final  ? Organism ID, Bacteria KLEBSIELLA PNEUMONIAE  Final  ?    Susceptibility  ? Klebsiella pneumoniae - MIC*  ?  AMPICILLIN >=32 RESISTANT Resistant   ?  CEFAZOLIN <=4 SENSITIVE Sensitive   ?  CEFEPIME <=0.12 SENSITIVE Sensitive   ?  CEFTAZIDIME <=1 SENSITIVE Sensitive   ?  CEFTRIAXONE <=0.25 SENSITIVE Sensitive   ?  CIPROFLOXACIN <=0.25 SENSITIVE Sensitive   ?  GENTAMICIN <=1 SENSITIVE Sensitive   ?  IMIPENEM <=0.25 SENSITIVE Sensitive   ?  TRIMETH/SULFA <=  20 SENSITIVE Sensitive   ?  AMPICILLIN/SULBACTAM 8 SENSITIVE Sensitive   ?  PIP/TAZO <=4 SENSITIVE Sensitive   ?  * ABUNDANT KLEBSIELLA PNEUMONIAE  ?MRSA Next Gen by PCR, Nasal     Status: None  ? Collection Time: 02/24/22 12:45 PM  ? Specimen: Nasal Mucosa; Nasal Swab  ?Result Value Ref Range Status  ? MRSA by PCR Next Gen NOT DETECTED NOT DETECTED Final  ?  Comment: (NOTE) ?The GeneXpert MRSA Assay (FDA  approved for NASAL specimens only), ?is one component of a comprehensive MRSA colonization surveillance ?program. It is not intended to diagnose MRSA infection nor to guide ?or monitor treatment for MRSA infectio

## 2022-03-01 DIAGNOSIS — J9602 Acute respiratory failure with hypercapnia: Secondary | ICD-10-CM | POA: Diagnosis not present

## 2022-03-01 DIAGNOSIS — G9341 Metabolic encephalopathy: Secondary | ICD-10-CM | POA: Diagnosis not present

## 2022-03-01 DIAGNOSIS — F05 Delirium due to known physiological condition: Secondary | ICD-10-CM | POA: Diagnosis not present

## 2022-03-01 DIAGNOSIS — J9601 Acute respiratory failure with hypoxia: Secondary | ICD-10-CM | POA: Diagnosis not present

## 2022-03-01 LAB — CBC
HCT: 39.1 % (ref 36.0–46.0)
Hemoglobin: 12 g/dL (ref 12.0–15.0)
MCH: 24 pg — ABNORMAL LOW (ref 26.0–34.0)
MCHC: 30.7 g/dL (ref 30.0–36.0)
MCV: 78 fL — ABNORMAL LOW (ref 80.0–100.0)
Platelets: 363 10*3/uL (ref 150–400)
RBC: 5.01 MIL/uL (ref 3.87–5.11)
RDW: 17.8 % — ABNORMAL HIGH (ref 11.5–15.5)
WBC: 10 10*3/uL (ref 4.0–10.5)
nRBC: 0 % (ref 0.0–0.2)

## 2022-03-01 LAB — GLUCOSE, CAPILLARY
Glucose-Capillary: 105 mg/dL — ABNORMAL HIGH (ref 70–99)
Glucose-Capillary: 121 mg/dL — ABNORMAL HIGH (ref 70–99)
Glucose-Capillary: 141 mg/dL — ABNORMAL HIGH (ref 70–99)
Glucose-Capillary: 155 mg/dL — ABNORMAL HIGH (ref 70–99)
Glucose-Capillary: 161 mg/dL — ABNORMAL HIGH (ref 70–99)
Glucose-Capillary: 173 mg/dL — ABNORMAL HIGH (ref 70–99)

## 2022-03-01 LAB — BASIC METABOLIC PANEL
Anion gap: 6 (ref 5–15)
BUN: 6 mg/dL — ABNORMAL LOW (ref 8–23)
CO2: 35 mmol/L — ABNORMAL HIGH (ref 22–32)
Calcium: 8.6 mg/dL — ABNORMAL LOW (ref 8.9–10.3)
Chloride: 96 mmol/L — ABNORMAL LOW (ref 98–111)
Creatinine, Ser: 0.39 mg/dL — ABNORMAL LOW (ref 0.44–1.00)
GFR, Estimated: >60 mL/min (ref >60)
Glucose, Bld: 138 mg/dL — ABNORMAL HIGH (ref 70–99)
Potassium: 3.5 mmol/L (ref 3.5–5.1)
Sodium: 137 mmol/L (ref 135–145)

## 2022-03-01 MED ORDER — FREE WATER
200.0000 mL | Freq: Four times a day (QID) | Status: DC
  Administered 2022-03-01: 200 mL

## 2022-03-01 MED ORDER — THIAMINE HCL 100 MG PO TABS
100.0000 mg | ORAL_TABLET | Freq: Every day | ORAL | Status: DC
  Administered 2022-03-02 – 2022-03-17 (×16): 100 mg via ORAL
  Filled 2022-03-01 (×16): qty 1

## 2022-03-01 MED ORDER — ATORVASTATIN CALCIUM 40 MG PO TABS
40.0000 mg | ORAL_TABLET | Freq: Every day | ORAL | Status: DC
  Administered 2022-03-01 – 2022-03-16 (×16): 40 mg via ORAL
  Filled 2022-03-01 (×16): qty 1

## 2022-03-01 MED ORDER — ASPIRIN 81 MG PO CHEW
81.0000 mg | CHEWABLE_TABLET | Freq: Every day | ORAL | Status: DC
  Administered 2022-03-02: 81 mg via ORAL
  Filled 2022-03-01: qty 1

## 2022-03-01 MED ORDER — METOPROLOL TARTRATE 12.5 MG HALF TABLET
12.5000 mg | ORAL_TABLET | Freq: Two times a day (BID) | ORAL | Status: DC
  Administered 2022-03-01 – 2022-03-02 (×2): 12.5 mg via ORAL
  Filled 2022-03-01 (×2): qty 1

## 2022-03-01 NOTE — Progress Notes (Signed)
?Progress Note ?Patient: Tamara Russell TIR:[id number] DOB: 1961/08/06 DOA: 02/06/2022  ?DOS: the patient was seen and examined on 03/01/2022 ? ?Brief hospital course: ?Patient is a 61 y.o.  female morbid obesity, HTN, GERD-who was just discharged from this facility on 4/23-presented to the hospital on 4/24 with altered mental status.  Found to have possible metabolic encephalopathy.  Treated conservatively. ? ?Significant events. ?4/19-4/23>> hospitalization for chest pain, confusion-UTI.  Per notes mental status stable at discharge. ?4/24> presented to the ED for confusion-admit to [facility]. ?4/28>>Neuro eval-marital stress possibly causing AMS-psych consult recommended ?5/01>> episode of aspiration-bradycardia/worsening hypoxemia-improved after suctioning.CXR with bibasilar opacities-IV Ancef started.  BiPAP held ?5/04>>Neuro re-eval per psych rec's-?autoimmune process causing AMS ?5/05>> trial of Depakote per neurology.  LP planned under sedation on 5/6 ?5/06>> significant sedation/lethargy following Haldol/Ativan-concern for hypercarbia-ABG difficult to perform-BiPAP started.  LP postponed.  Haldol/Ativan dosage adjusted by psychiatry. ?5/7 intubated, eeg neg neuro consulted.  ?5/8 LP completed. Cor trak placed.  ?5/9 extubated, having Wild swings of agitated delirium with psychosis, and then episodes of minimal responsiveness there was some correlation between hypotension and decreased LOC. ?5/12 intubated in setting SBO and suspected aspiration ?5/15 extubated, respiratory status improving and mental status clearing ? ?Assessment and Plan: ?Acute metabolic encephalopathy ?Atypical delirium with psychosis ?Psychiatry and neurology consulted. Multiple EEGs without seizure.  ?Concern for possible metabolic encephalopathy as main etiology secondary to infections in addition to overall hospitalization.  ?LP obtained without evidence of pleocytosis; autoimmune encephalopathy panel obtained and is pending.  ?Neurology  recommended IVIG for empiric treatment of autoimmune encephalitis after infections have cleared.  ?Psychiatry recommending no changes to current medications; per their assessment, patient does not have capacity to make medical decisions. ?-Continue neurology/psychiatry recommendations ?-Delirium precautions ?-Continue thiamine ?-Continue Seroquel 50 mg qHS prn ?  ?Aspiration pneumonia ?Multiple episodes of aspiration. Klebsiella isolated on tracheal aspirate. ?-Continue Cefazolin ?  ?Small bowel obstruction, has been ruled out. ?Ileus ?Patient evaluated by general surgery.  ?Per general surgery, more likely ileus.  ?Patient's symptoms appear to have resolved. Having bowel movements. ?  ?Acute on chronic respiratory failure with hypoxia and hypercapnia ?Per family, oxygen is a new requirement from previous admission. Patient was intubated twice this admission secondary to aspiration issues. Now on supplemental oxygen via nasal canula. ?-Wean room air ?  ?Dysphagia ?Secondary to critical illness. Cortrak placed and patient is currently on tube feeds. ?-SLP recommends advancing to regular diet. ?We will discontinue core track and monitor. ?  ?GERD ?-Continue Protonix ?  ?OSA ?Initially on BiPAP which was discontinued secondary to aspiration. ?  ?Hypernatremia ?Resolved. ?  ?Chronic diastolic heart failure ?Stable. Euvolemic. ?  ?Primary hypertension ?-Continue metoprolol ?  ?Hyperlipidemia ?-Continue Lipitor ?  ?Asymptomatic bacteruria ?Treated with fosfomycin ?  ?Morbid obesity ?Body mass index is 42.07 kg/m?Marland Kitchen  ?Placing the pt at higher risk of poor outcomes. ? ?Subjective: No nausea no vomiting no fever no chills.  No further agitation reported by RN today.  Patient wants to get the feeding tube out as she is tolerating food by mouth. ? ?Physical Exam: ?Vitals:  ? 03/01/22 0444 03/01/22 0833 03/01/22 1109 03/01/22 1556  ?BP: 122/60 (!) 145/80 132/72 (!) 99/51  ?Pulse: 81 86 79 76  ?Resp: 17 16 16 16   ?Temp: 98 ?F  (36.7 ?C) 98.2 ?F (36.8 ?C) 98.7 ?F (37.1 ?C) 98.5 ?F (36.9 ?C)  ?TempSrc: Oral Oral Oral Oral  ?SpO2: 99% 97% 98% 96%  ?Weight:      ?Height:      ? ?  General: Appear in mild distress; no visible Abnormal Neck Mass Or lumps, Conjunctiva normal ?Cardiovascular: S1 and S2 Present, no Murmur, ?Respiratory: good respiratory effort, Bilateral Air entry present and faint crackles, no wheezes ?Abdomen: Bowel Sound present, Non tender  ?Extremities: trace Pedal edema ?Neurology: alert and oriented to time, place, and person ?Gait not checked due to patient safety concerns  ? ?Data Reviewed: ?I have Reviewed nursing notes, Vitals, and Lab results since pt's last encounter. Pertinent lab results CBC and BMP ?I have ordered test including CBC and BMP ?I have reviewed the last note from neurology,   ? ?Family Communication: Husband at bedside ? ?Disposition: ?Status is: Inpatient ?Remains inpatient appropriate because: Ongoing confusion with limited oral intake ? ?Author: ?Lynden Oxford, MD ?03/01/2022 7:39 PM ? ?Please look on [email or web address] to find out who is on call. ?

## 2022-03-01 NOTE — Progress Notes (Signed)
Speech Language Pathology Treatment: Dysphagia  ?Patient Details ?Name: Tamara Russell ?MRN: [id number] ?DOB: 22-Dec-1960 ?Today's Date: 03/01/2022 ?Time: 1440-1450 ?SLP Time Calculation (min) (ACUTE ONLY): 10 min ? ?Assessment / Plan / Recommendation ?Clinical Impression ? Tamara Russell was slightly confused this afternoon saying one of the staff members told her "they kill people here" and was initially perseverative on this. Dentures cleaned and donned to observe with regular texture. Mastication somewhat slower since she was not as alert as yesterday but no residue and needed encouragement to eat. Delayed throat clear x 1 during session not concerning for penetration or aspiration. Recommend she continue regular/thin, pills with water and no further ST needed.  ?  ?HPI HPI: 61 y.o. female who presents after being found altered. Recent hospitalization from 4/19-4/23 after presenting for altered mental status. MRI negative; Psychiatry consult due to recent home stressors and pt with hallucinations; +delirium. Pt intubated 5/7-5/9, re-intubated 5/11-5/14 with concern for aspiration PNA and SBO. PMH: GERD, HTN, reformed smoker, recently hypoxic and started on home O2, and obesity. BSE 5/1 rec NPO and Dys 1/thin recommended 5/2. ?  ?   ?SLP Plan ? All goals met;Discharge SLP treatment due to (comment) ? ?  ?  ?Recommendations for follow up therapy are one component of a multi-disciplinary discharge planning process, led by the attending physician.  Recommendations may be updated based on patient status, additional functional criteria and insurance authorization. ?  ? ?Recommendations  ?Diet recommendations: Regular;Thin liquid ?Liquids provided via: Straw ?Medication Administration: Whole meds with liquid ?Supervision: Intermittent supervision to cue for compensatory strategies;Patient able to self feed ?Compensations: Slow rate;Small sips/bites ?Postural Changes and/or Swallow Maneuvers: Seated upright 90 degrees   ?   ?    ?   ? ? ? ? Oral Care Recommendations: Oral care BID ?Follow Up Recommendations: No SLP follow up ?Assistance recommended at discharge: Intermittent Supervision/Assistance ?SLP Visit Diagnosis: Dysphagia, unspecified (R13.10) ?Plan: All goals met;Discharge SLP treatment due to (comment) ? ? ? ? ?  ?  ? ? ?Tamara Russell ? ?03/01/2022, 3:09 PM ?

## 2022-03-01 NOTE — Hospital Course (Addendum)
Patient is a 61 y.o.  female morbid obesity, HTN, GERD-who was just discharged from this facility on 4/23-presented to the hospital on 4/24 with altered mental status.  Found to have metabolic encephalopathy.  Significant events: 4/19-4/23>> hospitalization for chest pain, confusion - UTI. 4/24 Presented to the ED for confusion-admit to [facility]. 4/28 Neuro eval - marital stress possibly causing AMS - psych consult recommended 5/01 Episode of aspiration - bradycardia/worsening hypoxemia - improved after suctioning.CXR with bibasilar opacities-IV Ancef started. 5/04 Neuro re-eval per psych rec's-?autoimmune process causing AMS 5/05 Trial of Depakote per neurology. 5/06 Significant sedation / lethargy following Haldol/Ativan -concern for hypercarbia -BiPAP started. LP postponed. Haldol/Ativan dosage adjusted by psychiatry. 5/07 Intubated, eeg neg neuro consulted.  5/08 LP completed. Cortrak placed. 5/09 Extubated, continues to have agitated delirium with psychosis, and then episodes of minimal responsiveness 5/12 Intubated in setting SBO and suspected aspiration 5/15 Extubated, respiratory status improving and mental status clearing  05/16 transferred to [facility].   5/19 Completed antibiotic course the day before.  Started on IVIG. 5/23, 5 days IVIG completed.  5/24 was started on CPAP after first 3 nights of poor compliance, she has slowly started improving, now compliant,  still some waxing and waning mental status but overall better  06/01 patient medically stable, pending transfer to SNF to continue physical therapy.

## 2022-03-01 NOTE — Consult Note (Signed)
[facility] Face-to-Face Psychiatry Consult  ? ?Reason for Consult:  AMS and psychosis ?Referring Physician:  Dr. Lonny Prude ?Patient Identification: Tamara Russell ?MRN:  [id number] ?Principal Diagnosis: Acute metabolic encephalopathy ?Diagnosis:  Principal Problem: ?  Acute metabolic encephalopathy ?Active Problems: ?  Acute respiratory failure with hypoxia and hypercapnia (HCC) ?  Hypertension ?  PUD (peptic ulcer disease) ?  Elevated troponin ?  History of UTI ?  Hypokalemia ?  Class 3 severe obesity due to excess calories with serious comorbidity and body mass index (BMI) of 40.0 to 44.9 in adult [facility]) ?  Microcytosis ?  Diastolic dysfunction ?  Encephalopathy ?  Delirium due to another medical condition ? ? ?Total Time spent with patient: 30 minutes ? ?Subjective:   ?Tamara Russell is a 61 y.o. female patient  Admitted on 02/06/2022 for altered mental status and psychosis. Psychiatry consulted for AMS and psychosis.  ? ?Today upon reassessment patient continues to be much alert and oriented. She is appropriate and answers all questions. She does not display any symptoms of paranoia or psychosis at this time. She is much more vocal about her martial issues that contributed to her current hospitalization (stress). Her husband is present during this time, and admits to some statements. SHe states upon discharge she is going to her daughters house to stay, to allow some time between her and her husband. He is supportive of this. She is able to show some insight into her illness, as she is aware she has a long road to recovery after (2) intubations and deconditioning. She further denies any history of substance abuse at this time, to include alcohol use prior to this admission. BAL elevated. She reports fair sleep and poor appetite (secondary to dislike of heart healthy foods). She is able to eat with her panda tube in her nose, and states she must eat more consistently in order to have it removed. She denies any  suicidal ideations, homicidal ideations and or hallucinations.  ? ?Collateral ?Additional collateral from husband: Patient has been sleeping better.  She enjoyed having her sister's visit "girls night out ."  ? ?HPI :   ?Tamara Russell is a 61 y.o. female patient  Admitted on 02/06/2022 for altered mental status and psychosis. Psychiatry consulted for AMS and psychosis.  ? ? ? ?Past Medical History:  ?Past Medical History:  ?Diagnosis Date  ? Acid reflux   ? Complication of anesthesia   ? slow to wake up  ? History of COVID-19   ? Hypertension   ?  ?Past Surgical History:  ?Procedure Laterality Date  ? ABDOMINAL HYSTERECTOMY    ? BIOPSY  11/17/2018  ? Procedure: BIOPSY;  Surgeon: Jerene Bears, MD;  Location: [facility] ENDOSCOPY;  Service: Gastroenterology;;  ? BREAST EXCISIONAL BIOPSY Left   ? benign  ? BREAST EXCISIONAL BIOPSY Right   ? benign  ? ESOPHAGOGASTRODUODENOSCOPY (EGD) WITH PROPOFOL N/A 11/17/2018  ? Procedure: ESOPHAGOGASTRODUODENOSCOPY (EGD) WITH PROPOFOL;  Surgeon: Jerene Bears, MD;  Location: [facility] ENDOSCOPY;  Service: Gastroenterology;  Laterality: N/A;  ? ?Family History:  ?Family History  ?Problem Relation Age of Onset  ? Cancer Father 20  ?     stomach  ? Hypertension Father   ? Diabetes Maternal Grandmother   ? Colon cancer Maternal Grandfather   ? Diabetes Paternal Grandmother   ? Colon cancer Paternal Grandmother   ? ?Family history ?No family psychiatric history disclosed.  ?The patient's family history includes Cancer (age of onset: 44) in  her father; Colon cancer in her maternal grandfather and paternal grandmother; Diabetes in her maternal grandmother and paternal grandmother; Hypertension in her father. ?  ? ? ?Social History:  ?Social History  ? ?Substance and Sexual Activity  ?Alcohol Use No  ? Alcohol/week: 0.0 standard drinks  ?   ?Social History  ? ?Substance and Sexual Activity  ?Drug Use No  ?  ?Social History  ? ?Socioeconomic History  ? Marital status: Married  ?  Spouse name: Not on file   ? Number of children: Not on file  ? Years of education: Not on file  ? Highest education level: Not on file  ?Occupational History  ? Occupation: unemployed  ?Tobacco Use  ? Smoking status: Former  ?  Packs/day: 0.50  ?  Years: 20.00  ?  Pack years: 10.00  ?  Types: Cigarettes  ?  Start date: 04/30/1975  ?  Quit date: 04/30/1995  ?  Years since quitting: 26.8  ? Smokeless tobacco: Never  ?Vaping Use  ? Vaping Use: Never used  ?Substance and Sexual Activity  ? Alcohol use: No  ?  Alcohol/week: 0.0 standard drinks  ? Drug use: No  ? Sexual activity: Not on file  ?Other Topics Concern  ? Not on file  ?Social History Narrative  ? Not on file  ? ?Social Determinants of Health  ? ?Financial Resource Strain: Not on file  ?Food Insecurity: Not on file  ?Transportation Needs: Not on file  ?Physical Activity: Not on file  ?Stress: Not on file  ?Social Connections: Not on file  ? ?Additional Social History: ?  ? ?Allergies:   ?Allergies  ?Allergen Reactions  ? Penicillins Other (See Comments)  ?  Childhood allergy. Pt does not remember reaction ?Tolerated cefazolin 5/1-5/6, cefepime 5/12-5/14  ? ? ?Labs:  ?Results for orders placed or performed during the hospital encounter of 02/06/22 (from the past 48 hour(s))  ?Magnesium     Status: None  ? Collection Time: 02/27/22  1:18 PM  ?Result Value Ref Range  ? Magnesium 1.8 1.7 - 2.4 mg/dL  ?  Comment: Performed at [facility], [place] [address] [place], [place] [address]  ?Glucose, capillary     Status: Abnormal  ? Collection Time: 02/27/22 11:28 PM  ?Result Value Ref Range  ? Glucose-Capillary 133 (H) 70 - 99 mg/dL  ?  Comment: Glucose reference range applies only to samples taken after fasting for at least 8 hours.  ?Glucose, capillary     Status: Abnormal  ? Collection Time: 02/28/22  3:47 AM  ?Result Value Ref Range  ? Glucose-Capillary 120 (H) 70 - 99 mg/dL  ?  Comment: Glucose reference range applies only to samples taken after fasting for at least 8 hours.  ?Glucose,  capillary     Status: Abnormal  ? Collection Time: 02/28/22  4:49 AM  ?Result Value Ref Range  ? Glucose-Capillary 123 (H) 70 - 99 mg/dL  ?  Comment: Glucose reference range applies only to samples taken after fasting for at least 8 hours.  ?Glucose, capillary     Status: Abnormal  ? Collection Time: 02/28/22  8:16 AM  ?Result Value Ref Range  ? Glucose-Capillary 136 (H) 70 - 99 mg/dL  ?  Comment: Glucose reference range applies only to samples taken after fasting for at least 8 hours.  ?Glucose, capillary     Status: Abnormal  ? Collection Time: 02/28/22 11:37 AM  ?Result Value Ref Range  ? Glucose-Capillary 129 (H)  70 - 99 mg/dL  ?  Comment: Glucose reference range applies only to samples taken after fasting for at least 8 hours.  ?Glucose, capillary     Status: Abnormal  ? Collection Time: 02/28/22  4:16 PM  ?Result Value Ref Range  ? Glucose-Capillary 144 (H) 70 - 99 mg/dL  ?  Comment: Glucose reference range applies only to samples taken after fasting for at least 8 hours.  ?Glucose, capillary     Status: Abnormal  ? Collection Time: 02/28/22  9:34 PM  ?Result Value Ref Range  ? Glucose-Capillary 148 (H) 70 - 99 mg/dL  ?  Comment: Glucose reference range applies only to samples taken after fasting for at least 8 hours.  ?Glucose, capillary     Status: Abnormal  ? Collection Time: 03/01/22 12:51 AM  ?Result Value Ref Range  ? Glucose-Capillary 155 (H) 70 - 99 mg/dL  ?  Comment: Glucose reference range applies only to samples taken after fasting for at least 8 hours.  ?Glucose, capillary     Status: Abnormal  ? Collection Time: 03/01/22  4:58 AM  ?Result Value Ref Range  ? Glucose-Capillary 141 (H) 70 - 99 mg/dL  ?  Comment: Glucose reference range applies only to samples taken after fasting for at least 8 hours.  ?Basic metabolic panel     Status: Abnormal  ? Collection Time: 03/01/22  5:00 AM  ?Result Value Ref Range  ? Sodium 137 135 - 145 mmol/L  ? Potassium 3.5 3.5 - 5.1 mmol/L  ? Chloride 96 (L) 98 - 111  mmol/L  ? CO2 35 (H) 22 - 32 mmol/L  ? Glucose, Bld 138 (H) 70 - 99 mg/dL  ?  Comment: Glucose reference range applies only to samples taken after fasting for at least 8 hours.  ? BUN 6 (L) 8 - 23 mg/dL  ? C

## 2022-03-01 NOTE — Progress Notes (Signed)
Physical Therapy Treatment ?Patient Details ?Name: Tamara Russell ?MRN: [id number] ?DOB: 1960-12-16 ?Today's Date: 03/01/2022 ? ? ?History of Present Illness 61 y.o. female who presents after being found altered this morning sitting on the porch. Patient had just recently been hospitalized from 4/19-4/23 after presenting for altered mental status. MRI negative; Psychiatry consult due to recent home stressors and pt with hallucinations; +delirium. Pt intubated 5/7-5/9, re-intubated 5/11-5/14 with concern for aspiration PNA and SBO. PMH significant of HTN, reformed smoker, recently hypoxic and started on home O2, and obesity. ? ?  ?PT Comments  ? ? Pt more alert today but still confused. Pt did participate and attempt to assist with transfers today however remains very deconditioned, lethargic, and requires maxA for all mobility. Pt to require lift equipment for safe OOB transfer. Acute PT to cont to follow. SNF remains appropriate for d/c. ?   ?Recommendations for follow up therapy are one component of a multi-disciplinary discharge planning process, led by the attending physician.  Recommendations may be updated based on patient status, additional functional criteria and insurance authorization. ? ?Follow Up Recommendations ? Skilled nursing-short term rehab (<3 hours/day) ?  ?  ?Assistance Recommended at Discharge Frequent or constant Supervision/Assistance  ?Patient can return home with the following Two people to help with walking and/or transfers;Two people to help with bathing/dressing/bathroom;Assistance with cooking/housework;Direct supervision/assist for medications management;Direct supervision/assist for financial management;Assist for transportation;Help with stairs or ramp for entrance ?  ?Equipment Recommendations ?  (TBD at next venue)  ?  ?Recommendations for Other Services   ? ? ?  ?Precautions / Restrictions Precautions ?Precautions: Fall ?Precaution Comments: confused ?Restrictions ?Weight  Bearing Restrictions: No  ?  ? ?Mobility ? Bed Mobility ?Overal bed mobility: Needs Assistance ?Bed Mobility: Supine to Sit, Sit to Supine ?  ?  ?Supine to sit: Max assist, HOB elevated ?Sit to supine: Max assist, +2 for physical assistance ?  ?General bed mobility comments: with verbal and tactile cues pt moved LEs to EOB, pt pulled up on PT with UEs to assist with trunk elevation, ?  ? ?Transfers ?Overall transfer level: Needs assistance ?Equipment used: 2 person hand held assist (face to face transfer) ?  ?Sit to Stand: Max assist, +2 physical assistance ?  ?  ?  ?  ?  ?General transfer comment: able to clear bottom to scoot to [facility] x2 however unable to achieve full upright standing ?  ? ?Ambulation/Gait ?  ?  ?  ?  ?  ?  ?  ?  ? ? ?Stairs ?  ?  ?  ?  ?  ? ? ?Wheelchair Mobility ?  ? ?Modified Rankin (Stroke Patients Only) ?  ? ? ?  ?Balance Overall balance assessment: Needs assistance ?Sitting-balance support: Feet supported, Bilateral upper extremity supported ?Sitting balance-Leahy Scale: Poor ?Sitting balance - Comments: pt would be able to hold self up for about 10 sec prior to leaning to the R onto therapist, max verbal and tactile cues to sit upright, pt frequently closing eyes requiring max verbal cues to stay awake ?  ?Standing balance support: During functional activity ?Standing balance-Leahy Scale: Zero ?Standing balance comment: Unable to clear buttocks from bed on 2attempts ?  ?  ?  ?  ?  ?  ?  ?  ?  ?  ?  ?  ? ?  ?Cognition Arousal/Alertness: Awake/alert (pt goes in/out of lethargy, is awake but then would fall asleep during session) ?Behavior During Therapy: Flat affect ?Overall Cognitive Status:  Impaired/Different from baseline ?Area of Impairment: Orientation, Attention, Memory, Following commands, Safety/judgement, Awareness, Problem solving ?  ?  ?  ?  ?  ?  ?  ?  ?Orientation Level: Disoriented to, Place, Situation, Time ?Current Attention Level: Focused ?Memory: Decreased short-term  memory ?Following Commands: Follows one step commands inconsistently ?Safety/Judgement: Decreased awareness of safety, Decreased awareness of deficits ?Awareness: Emergent (pt able to state she needed to pee) ?Problem Solving: Slow processing, Decreased initiation, Difficulty sequencing, Requires verbal cues, Requires tactile cues ?General Comments: pt confused, thinking PT is someone else that got "kicked out from downstairs" pt stating "because Im in love" when asked why she keeps falling asleep ?  ?  ? ?  ?Exercises General Exercises - Lower Extremity ?Long Arc Quad: AROM, Both, 10 reps, Seated (max verbal and tactile cues to stay on tast) ? ?  ?General Comments General comments (skin integrity, edema, etc.): pt reports the back of R heel is sore, SPO2 >95% on 2Lo2 via [address] ?  ?  ? ?Pertinent Vitals/Pain Pain Assessment ?Pain Assessment: Faces ?Faces Pain Scale: Hurts little more ?Pain Location: generalized grimacing, moaning with any movement ?Pain Descriptors / Indicators: Grimacing ?Pain Intervention(s): Monitored during session  ? ? ?Home Living   ?  ?  ?  ?  ?  ?  ?  ?  ?  ?   ?  ?Prior Function    ?  ?  ?   ? ?PT Goals (current goals can now be found in the care plan section) Acute Rehab PT Goals ?PT Goal Formulation: With family ?Time For Goal Achievement: 03/15/22 ?Potential to Achieve Goals: Fair ?Progress towards PT goals: Progressing toward goals ? ?  ?Frequency ? ? ? Min 3X/week ? ? ? ?  ?PT Plan Current plan remains appropriate  ? ? ?Co-evaluation   ?  ?  ?  ?  ? ?  ?AM-PAC PT "6 Clicks" Mobility   ?Outcome Measure ? Help needed turning from your back to your side while in a flat bed without using bedrails?: A Lot ?Help needed moving from lying on your back to sitting on the side of a flat bed without using bedrails?: A Lot ?Help needed moving to and from a bed to a chair (including a wheelchair)?: Total ?Help needed standing up from a chair using your arms (e.g., wheelchair or bedside chair)?:  Total ?Help needed to walk in hospital room?: Total ?Help needed climbing 3-5 steps with a railing? : Total ?6 Click Score: 8 ? ?  ?End of Session Equipment Utilized During Treatment: Oxygen ?Activity Tolerance: Patient limited by lethargy ?Patient left: in bed;with call bell/phone within reach;with bed alarm set;with family/visitor present ?Nurse Communication: Mobility status;Need for lift equipment ?PT Visit Diagnosis: Other abnormalities of gait and mobility (R26.89);Muscle weakness (generalized) (M62.81) ?  ? ? ?Time: [id number] ?PT Time Calculation (min) (ACUTE ONLY): 43 min ? ?Charges:  $Therapeutic Exercise: 8-22 mins ?$Therapeutic Activity: 23-37 mins          ?          ? ?Kittie Plater, PT, DPT ?Acute Rehabilitation Services ?Secure chat preferred ?Office #: [phone] ? ? ? ?Merrie Epler M Guss Farruggia ?03/01/2022, 11:07 AM ? ?

## 2022-03-02 ENCOUNTER — Inpatient Hospital Stay (HOSPITAL_COMMUNITY): Payer: 59

## 2022-03-02 DIAGNOSIS — G9341 Metabolic encephalopathy: Secondary | ICD-10-CM | POA: Diagnosis not present

## 2022-03-02 LAB — GLUCOSE, CAPILLARY
Glucose-Capillary: 111 mg/dL — ABNORMAL HIGH (ref 70–99)
Glucose-Capillary: 115 mg/dL — ABNORMAL HIGH (ref 70–99)
Glucose-Capillary: 129 mg/dL — ABNORMAL HIGH (ref 70–99)
Glucose-Capillary: 131 mg/dL — ABNORMAL HIGH (ref 70–99)
Glucose-Capillary: 94 mg/dL (ref 70–99)

## 2022-03-02 MED ORDER — ACETAMINOPHEN 650 MG RE SUPP: 650.0000 mg | Freq: Four times a day (QID) | RECTAL | Status: DC | PRN

## 2022-03-02 MED ORDER — ENOXAPARIN SODIUM 40 MG/0.4ML IJ SOSY
40.0000 mg | PREFILLED_SYRINGE | INTRAMUSCULAR | Status: DC
  Administered 2022-03-02 – 2022-03-05 (×4): 40 mg via SUBCUTANEOUS
  Filled 2022-03-02 (×4): qty 0.4

## 2022-03-02 MED ORDER — CEFADROXIL 500 MG PO CAPS
500.0000 mg | ORAL_CAPSULE | Freq: Two times a day (BID) | ORAL | Status: AC
  Administered 2022-03-02 (×2): 500 mg via ORAL
  Filled 2022-03-02 (×2): qty 1

## 2022-03-02 MED ORDER — ASPIRIN 81 MG PO TBEC
81.0000 mg | DELAYED_RELEASE_TABLET | Freq: Every day | ORAL | Status: DC
  Administered 2022-03-03 – 2022-03-13 (×11): 81 mg via ORAL
  Filled 2022-03-02 (×11): qty 1

## 2022-03-02 MED ORDER — ACETAMINOPHEN 325 MG PO TABS
650.0000 mg | ORAL_TABLET | Freq: Four times a day (QID) | ORAL | Status: DC | PRN
  Administered 2022-03-02 – 2022-03-14 (×10): 650 mg via ORAL
  Filled 2022-03-02 (×14): qty 2

## 2022-03-02 MED ORDER — FUROSEMIDE 10 MG/ML IJ SOLN
20.0000 mg | Freq: Once | INTRAMUSCULAR | Status: AC
Start: 2022-03-02 — End: 2022-03-02
  Administered 2022-03-02: 20 mg via INTRAVENOUS
  Filled 2022-03-02: qty 4

## 2022-03-02 MED ORDER — METOPROLOL SUCCINATE ER 25 MG PO TB24
12.5000 mg | ORAL_TABLET | Freq: Every day | ORAL | Status: DC
  Administered 2022-03-03 – 2022-03-05 (×3): 12.5 mg via ORAL
  Filled 2022-03-02 (×3): qty 1

## 2022-03-02 NOTE — Plan of Care (Signed)
Pt is oriented x 1, pt c/o pain to bilateral feet, SCDs placed. Feet propped off bed with pillow. Pt has been turned and dried. Pt noted pulling purewick off multiple times and not in place. Pt has bowel movement using bedpan.  Pt had bath/ CHG bath this morning as well. Sheets changed. New purewick applied. No distress noted.    Problem: Education: Goal: Knowledge of General Education information will improve Description: Including pain rating scale, medication(s)/side effects and non-pharmacologic comfort measures Outcome: Progressing   Problem: Health Behavior/Discharge Planning: Goal: Ability to manage health-related needs will improve Outcome: Progressing   Problem: Clinical Measurements: Goal: Ability to maintain clinical measurements within normal limits will improve Outcome: Progressing Goal: Will remain free from infection Outcome: Progressing Goal: Diagnostic test results will improve Outcome: Progressing Goal: Respiratory complications will improve Outcome: Progressing   Problem: Activity: Goal: Risk for activity intolerance will decrease Outcome: Progressing   Problem: Nutrition: Goal: Adequate nutrition will be maintained Outcome: Progressing   Problem: Coping: Goal: Level of anxiety will decrease Outcome: Progressing   Problem: Elimination: Goal: Will not experience complications related to bowel motility Outcome: Progressing Goal: Will not experience complications related to urinary retention Outcome: Progressing   Problem: Pain Managment: Goal: General experience of comfort will improve Outcome: Progressing   Problem: Safety: Goal: Ability to remain free from injury will improve Outcome: Progressing   Problem: Skin Integrity: Goal: Risk for impaired skin integrity will decrease Outcome: Progressing   Problem: Education: Goal: Knowledge of General Education information will improve Description: Including pain rating scale, medication(s)/side  effects and non-pharmacologic comfort measures Outcome: Progressing   Problem: Health Behavior/Discharge Planning: Goal: Ability to manage health-related needs will improve Outcome: Progressing   Problem: Clinical Measurements: Goal: Ability to maintain clinical measurements within normal limits will improve Outcome: Progressing Goal: Will remain free from infection Outcome: Progressing Goal: Diagnostic test results will improve Outcome: Progressing Goal: Respiratory complications will improve Outcome: Progressing Goal: Cardiovascular complication will be avoided Outcome: Progressing   Problem: Activity: Goal: Risk for activity intolerance will decrease Outcome: Progressing   Problem: Nutrition: Goal: Adequate nutrition will be maintained Outcome: Progressing   Problem: Coping: Goal: Level of anxiety will decrease Outcome: Progressing   Problem: Elimination: Goal: Will not experience complications related to bowel motility Outcome: Progressing Goal: Will not experience complications related to urinary retention Outcome: Progressing   Problem: Pain Managment: Goal: General experience of comfort will improve Outcome: Progressing   Problem: Safety: Goal: Ability to remain free from injury will improve Outcome: Progressing   Problem: Skin Integrity: Goal: Risk for impaired skin integrity will decrease Outcome: Progressing   Problem: Activity: Goal: Ability to tolerate increased activity will improve Outcome: Progressing   Problem: Respiratory: Goal: Ability to maintain a clear airway and adequate ventilation will improve Outcome: Progressing   Problem: Role Relationship: Goal: Method of communication will improve Outcome: Progressing

## 2022-03-02 NOTE — Progress Notes (Signed)
Occupational Therapy Treatment Patient Details Name: Tamara Russell MRN: [id number] DOB: 10/13/1961 Today's Date: 03/02/2022   History of present illness 61 y.o. female who presents after being found altered this morning sitting on the porch. Patient had just recently been hospitalized from 4/19-4/23 after presenting for altered mental status. MRI negative; Psychiatry consult due to recent home stressors and pt with hallucinations; +delirium. Pt intubated 5/7-5/9, re-intubated 5/11-5/14 with concern for aspiration PNA and SBO. PMH significant of HTN, reformed smoker, recently hypoxic and started on home O2, and obesity.   OT comments  Pt making incremental progress this session, as she tolerated sitting EOB well with no support and was very motivated to participate in all tasks. Pt requiring min A to bring BUE up to her face due to weakness, if placed in a gravity minimized plane, pt is able to mobilize BUE better. This session pt wanted to get out of bed to the chair and required max A +2 to transfer via a lateral scoot with bed pad under her. Maximove pad with clips left under pt for nursing staff to assist pt back to bed. Continuing to recommend further therapies to maximize her independence and safety. OT will follow acutely.    Recommendations for follow up therapy are one component of a multi-disciplinary discharge planning process, led by the attending physician.  Recommendations may be updated based on patient status, additional functional criteria and insurance authorization.    Follow Up Recommendations  Skilled nursing-short term rehab (<3 hours/day)    Assistance Recommended at Discharge Frequent or constant Supervision/Assistance  Patient can return home with the following  Two people to help with walking and/or transfers;Two people to help with bathing/dressing/bathroom;Assistance with cooking/housework;Assistance with feeding;Help with stairs or ramp for entrance;Assist for  transportation;Direct supervision/assist for financial management;Direct supervision/assist for medications management   Equipment Recommendations  Other (comment) (TBD)    Recommendations for Other Services      Precautions / Restrictions Precautions Precautions: Fall Precaution Comments: confused Restrictions Weight Bearing Restrictions: No       Mobility Bed Mobility Overal bed mobility: Needs Assistance Bed Mobility: Supine to Sit     Supine to sit: Mod assist, HOB elevated     General bed mobility comments: Mod A to bring BLE off the bed and lift trunk to sitting, pt very helpful    Transfers Overall transfer level: Needs assistance Equipment used: 2 person hand held assist Transfers: Bed to chair/wheelchair/BSC            Lateral/Scoot Transfers: +2 safety/equipment, Max assist, +2 physical assistance General transfer comment: Pt assisting with pushing down through BLE to push herself over.     Balance Overall balance assessment: Needs assistance Sitting-balance support: Feet supported, Bilateral upper extremity supported Sitting balance-Leahy Scale: Fair Sitting balance - Comments: Pt able to sit EOB with no assist and lean slightly out of her Base of support   Standing balance support: During functional activity Standing balance-Leahy Scale: Zero Standing balance comment: Unable to clear buttocks from bed on 2attempts                           ADL either performed or assessed with clinical judgement   ADL Overall ADL's : Needs assistance/impaired     Grooming: Wash/dry hands;Wash/dry face;Minimal assistance;Sitting Grooming Details (indicate cue type and reason): Min A to assist with bringing BUE to her face due to weakness/deconditioning.  Toilet Transfer: Maximal assistance;+2 for physical assistance;With caregiver independent Training and development officer Details (indicate cue type and reason): simulated  with lateral slide to chair           General ADL Comments: Pt with increased strength and cognition this session,however she continues to wax and wane some.    Extremity/Trunk Assessment              Vision       Perception     Praxis      Cognition Arousal/Alertness: Awake/alert Behavior During Therapy: WFL for tasks assessed/performed Overall Cognitive Status: Impaired/Different from baseline Area of Impairment: Orientation, Following commands, Safety/judgement, Problem solving                 Orientation Level: Disoriented to, Place, Situation     Following Commands: Follows one step commands inconsistently Safety/Judgement: Decreased awareness of safety, Decreased awareness of deficits   Problem Solving: Slow processing, Decreased initiation, Difficulty sequencing, Requires verbal cues, Requires tactile cues General Comments: Pt initially quick to respond and appropriate in conversation, aware of surroundings and tasks, as session continued pt started to state things like wanting to go to the front room, unaware of being in the hopsital and having difficulty following directions.        Exercises      Shoulder Instructions       General Comments VSS on 2L    Pertinent Vitals/ Pain       Pain Assessment Pain Assessment: Faces Faces Pain Scale: Hurts little more Pain Location: R heel - no redness or discoloration noted Pain Descriptors / Indicators: Discomfort, Grimacing, Headache Pain Intervention(s): Monitored during session, Repositioned  Home Living                                          Prior Functioning/Environment              Frequency  Min 2X/week        Progress Toward Goals  OT Goals(current goals can now be found in the care plan section)  Progress towards OT goals: Progressing toward goals  Acute Rehab OT Goals Patient Stated Goal: To be able to walk again OT Goal Formulation: With  patient/family Time For Goal Achievement: 03/13/22 Potential to Achieve Goals: Good ADL Goals Pt Will Perform Grooming: with min guard assist;sitting Pt Will Perform Upper Body Dressing: with min assist;sitting Pt Will Perform Lower Body Dressing: with mod assist;sit to/from stand Pt Will Transfer to Toilet: with mod assist;stand pivot transfer;bedside commode Pt Will Perform Toileting - Clothing Manipulation and hygiene: with supervision;sitting/lateral leans;sit to/from stand Additional ADL Goal #1: Tolerate sitting edge of bed unsupported up to 10 min to increase ADL independence Additional ADL Goal #2: Pt will demonstrate improved cognition by consistently following 2 step instructions, and initiating ADLs when needed without cues.  Plan Discharge plan remains appropriate    Co-evaluation                 AM-PAC OT "6 Clicks" Daily Activity     Outcome Measure   Help from another person eating meals?: A Lot Help from another person taking care of personal grooming?: A Little Help from another person toileting, which includes using toliet, bedpan, or urinal?: Total Help from another person bathing (including washing, rinsing, drying)?: A Lot Help from another person to put on and taking off regular  upper body clothing?: A Lot Help from another person to put on and taking off regular lower body clothing?: Total 6 Click Score: 11    End of Session Equipment Utilized During Treatment: Gait belt;Oxygen  OT Visit Diagnosis: Unsteadiness on feet (R26.81);Other symptoms and signs involving cognitive function;Other abnormalities of gait and mobility (R26.89);Muscle weakness (generalized) (M62.81);Pain   Activity Tolerance Patient tolerated treatment well   Patient Left in chair;with call bell/phone within reach;with family/visitor present   Nurse Communication Mobility status        Time: [id number] OT Time Calculation (min): 47 min  Charges: OT General Charges $OT Visit:  1 Visit OT Treatments $Self Care/Home Management : 8-22 mins $Therapeutic Activity: 23-37 mins  October Peery H., OTR/L Acute Rehabilitation  Jazyiah Yiu Elane Everett Ricciardelli 03/02/2022, 2:30 PM

## 2022-03-02 NOTE — Progress Notes (Signed)
Progress Note Patient: Tamara Russell [id number] DOB: 02-16-1961 DOA: 02/06/2022  DOS: the patient was seen and examined on 03/02/2022  Brief hospital course: Patient is a 61 y.o.  female morbid obesity, HTN, GERD-who was just discharged from this facility on 4/23-presented to the hospital on 4/24 with altered mental status.  Found to have possible metabolic encephalopathy.  Treated conservatively.  Significant events. 4/19-4/23>> hospitalization for chest pain, confusion-UTI.  Per notes mental status stable at discharge. 4/24> presented to the ED for confusion-admit to [facility]. 4/28>>Neuro eval-marital stress possibly causing AMS-psych consult recommended 5/01>> episode of aspiration-bradycardia/worsening hypoxemia-improved after suctioning.CXR with bibasilar opacities-IV Ancef started.  BiPAP held 5/04>>Neuro re-eval per psych rec's-?autoimmune process causing AMS 5/05>> trial of Depakote per neurology.  LP planned under sedation on 5/6 5/06>> significant sedation/lethargy following Haldol/Ativan-concern for hypercarbia-ABG difficult to perform-BiPAP started.  LP postponed.  Haldol/Ativan dosage adjusted by psychiatry. 5/7 intubated, eeg neg neuro consulted.  5/8 LP completed. Cor trak placed.  5/9 extubated, having Wild swings of agitated delirium with psychosis, and then episodes of minimal responsiveness there was some correlation between hypotension and decreased LOC. 5/12 intubated in setting SBO and suspected aspiration 5/15 extubated, respiratory status improving and mental status clearing Assessment and Plan: Acute metabolic encephalopathy Atypical delirium with psychosis Psychiatry and neurology consulted. Multiple EEGs without seizure.  Concern for possible metabolic encephalopathy as main etiology secondary to infections in addition to overall hospitalization.  LP obtained without evidence of pleocytosis; autoimmune encephalopathy panel obtained and is pending.  Neurology  recommended IVIG for empiric treatment of autoimmune encephalitis after infections have cleared.  Psychiatry recommending no changes to current medications; per their assessment, patient does not have capacity to make medical decisions. -Continue neurology/psychiatry recommendations -Delirium precautions -Continue thiamine -Continue Seroquel 50 mg qHS prn   Aspiration pneumonia Multiple episodes of aspiration. Klebsiella isolated on tracheal aspirate. -Continue Cefazolin   Small bowel obstruction, has been ruled out. Ileus Patient evaluated by general surgery.  Per general surgery, more likely ileus.  Patient's symptoms appear to have resolved. Having bowel movements.   Acute on chronic respiratory failure with hypoxia and hypercapnia Per family, oxygen is a new requirement from previous admission. Patient was intubated twice this admission secondary to aspiration issues. Now on supplemental oxygen via nasal canula. -Wean room air  Acute on chronic diastolic CHF. Patient is 15 L positive during the hospital stay. We will provide IV Lasix and monitor response.   Dysphagia Secondary to critical illness. Cortrak placed and patient was on tube feeds. -SLP recommends advancing to regular diet. We will discontinue core track and monitor.   GERD -Continue Protonix   OSA Initially on BiPAP which was discontinued secondary to aspiration.   Hypernatremia Resolved.   Chronic diastolic heart failure Stable. Euvolemic.   Primary hypertension -Continue metoprolol   Hyperlipidemia -Continue Lipitor   Asymptomatic bacteruria Treated with fosfomycin   Morbid obesity Body mass index is 42.07 kg/m.  Placing the pt at higher risk of poor outcomes.  Subjective: Reports breathing issues as well as pain on her foot which appears to be pins-and-needles like pain.  No nausea or vomiting.  No chest pain.  No abdominal pain.  Physical Exam: Vitals:   03/02/22 0956 03/02/22 1119  03/02/22 1534 03/02/22 1915  BP:  121/72 140/85 (!) 144/82  Pulse:  86 87 98  Resp:  20 18 20   Temp:  98.9 F (37.2 C) 98.7 F (37.1 C) 99.3 F (37.4 C)  TempSrc:  Oral Oral Oral  SpO2: 93% 99%  93%  Weight:      Height:       General: Appear in mild distress; no visible Abnormal Neck Mass Or lumps, Conjunctiva normal Cardiovascular: S1 and S2 Present, no Murmur, Respiratory: increased respiratory effort, Bilateral Air entry present and faint basal crackles, no wheezes Abdomen: Bowel Sound present, Non tender  Extremities: Trace pedal edema Neurology: alert and oriented to time, place, and person Gait not checked due to patient safety concerns   Data Reviewed: I have Reviewed nursing notes, Vitals, and Lab results since pt's last encounter. Pertinent lab results CBC and BMP I have ordered test including CBC and BMP    Family Communication: Husband at bedside  Disposition: Status is: Inpatient Remains inpatient appropriate because: Minimal oral intake although gradually improving.  Currently receiving IV Lasix for diuresis.  Author: Lynden Oxford, MD 03/02/2022 7:59 PM  Please look on [email or web address] to find out who is on call.

## 2022-03-03 ENCOUNTER — Inpatient Hospital Stay (HOSPITAL_COMMUNITY): Payer: 59

## 2022-03-03 ENCOUNTER — Encounter (HOSPITAL_COMMUNITY): Payer: 59

## 2022-03-03 DIAGNOSIS — I70223 Atherosclerosis of native arteries of extremities with rest pain, bilateral legs: Secondary | ICD-10-CM | POA: Diagnosis not present

## 2022-03-03 DIAGNOSIS — F05 Delirium due to known physiological condition: Secondary | ICD-10-CM

## 2022-03-03 DIAGNOSIS — J9602 Acute respiratory failure with hypercapnia: Secondary | ICD-10-CM | POA: Diagnosis not present

## 2022-03-03 DIAGNOSIS — J9601 Acute respiratory failure with hypoxia: Secondary | ICD-10-CM | POA: Diagnosis not present

## 2022-03-03 DIAGNOSIS — G9341 Metabolic encephalopathy: Secondary | ICD-10-CM | POA: Diagnosis not present

## 2022-03-03 LAB — CBC
HCT: 40.4 % (ref 36.0–46.0)
Hemoglobin: 12.3 g/dL (ref 12.0–15.0)
MCH: 23.7 pg — ABNORMAL LOW (ref 26.0–34.0)
MCHC: 30.4 g/dL (ref 30.0–36.0)
MCV: 77.8 fL — ABNORMAL LOW (ref 80.0–100.0)
Platelets: 320 10*3/uL (ref 150–400)
RBC: 5.19 MIL/uL — ABNORMAL HIGH (ref 3.87–5.11)
RDW: 18.6 % — ABNORMAL HIGH (ref 11.5–15.5)
WBC: 11.4 10*3/uL — ABNORMAL HIGH (ref 4.0–10.5)
nRBC: 0 % (ref 0.0–0.2)

## 2022-03-03 LAB — MAGNESIUM: Magnesium: 1.8 mg/dL (ref 1.7–2.4)

## 2022-03-03 LAB — BASIC METABOLIC PANEL
Anion gap: 8 (ref 5–15)
BUN: 11 mg/dL (ref 8–23)
CO2: 34 mmol/L — ABNORMAL HIGH (ref 22–32)
Calcium: 9 mg/dL (ref 8.9–10.3)
Chloride: 98 mmol/L (ref 98–111)
Creatinine, Ser: 0.51 mg/dL (ref 0.44–1.00)
GFR, Estimated: >60 mL/min (ref >60)
Glucose, Bld: 91 mg/dL (ref 70–99)
Potassium: 4.3 mmol/L (ref 3.5–5.1)
Sodium: 140 mmol/L (ref 135–145)

## 2022-03-03 MED ORDER — ADULT MULTIVITAMIN W/MINERALS CH
1.0000 | ORAL_TABLET | Freq: Every day | ORAL | Status: DC
  Administered 2022-03-03 – 2022-03-17 (×15): 1 via ORAL
  Filled 2022-03-03 (×15): qty 1

## 2022-03-03 MED ORDER — ENSURE ENLIVE PO LIQD
237.0000 mL | Freq: Two times a day (BID) | ORAL | Status: DC
  Administered 2022-03-03 – 2022-03-17 (×25): 237 mL via ORAL

## 2022-03-03 MED ORDER — IMMUNE GLOBULIN (HUMAN) 10 GM/100ML IV SOLN
400.0000 mg/kg | INTRAVENOUS | Status: AC
  Administered 2022-03-03 – 2022-03-07 (×5): 40 g via INTRAVENOUS
  Filled 2022-03-03 (×6): qty 400

## 2022-03-03 NOTE — Progress Notes (Signed)
EEG complete - results pending 

## 2022-03-03 NOTE — Progress Notes (Addendum)
Progress Note Patient: Tamara Russell [id number] DOB: 1961/07/10 DOA: 02/06/2022  DOS: the patient was seen and examined on 03/03/2022  Brief hospital course: Patient is a 61 y.o.  female morbid obesity, HTN, GERD-who was just discharged from this facility on 4/23-presented to the hospital on 4/24 with altered mental status.  Found to have possible metabolic encephalopathy.  Treated conservatively.  Significant events. 4/19-4/23>> hospitalization for chest pain, confusion-UTI.  Per notes mental status stable at discharge. 4/24> presented to the ED for confusion-admit to [facility]. 4/28>>Neuro eval-marital stress possibly causing AMS-psych consult recommended 5/01>> episode of aspiration-bradycardia/worsening hypoxemia-improved after suctioning.CXR with bibasilar opacities-IV Ancef started.  BiPAP held 5/04>>Neuro re-eval per psych rec's-?autoimmune process causing AMS 5/05>> trial of Depakote per neurology.  LP planned under sedation on 5/6 5/06>> significant sedation/lethargy following Haldol/Ativan-concern for hypercarbia-ABG difficult to perform-BiPAP started.  LP postponed.  Haldol/Ativan dosage adjusted by psychiatry. 5/7 intubated, eeg neg neuro consulted.  5/8 LP completed. Cor trak placed.  5/9 extubated, having Wild swings of agitated delirium with psychosis, and then episodes of minimal responsiveness there was some correlation between hypotension and decreased LOC. 5/12 intubated in setting SBO and suspected aspiration 5/15 extubated, respiratory status improving and mental status clearing 5/19.  Completed antibiotic course the day before.  Started on IVIG.  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 Psychiatry and neurology consulted. Multiple EEGs without seizure.  Concern for possible metabolic encephalopathy as main etiology secondary to infections in addition to overall hospitalization.  LP  obtained without evidence of pleocytosis; autoimmune encephalopathy panel obtained and is pending.  Neurology recommended IVIG for empiric treatment of autoimmune encephalitis after infections have cleared.  Highly appreciate neurology assistance in this patient's management. Psychiatry recommending no changes to current medications; per their assessment, patient does not have capacity to make medical decisions. -Continue neurology/psychiatry recommendations -Delirium precautions -Continue thiamine -Continue Seroquel 50 mg qHS prn   Aspiration pneumonia Multiple episodes of aspiration. Klebsiella isolated on tracheal aspirate. -Continue Cefazolin   Small bowel obstruction, has been ruled out. Ileus Patient evaluated by general surgery.  Per general surgery, more likely ileus.  Patient's symptoms appear to have resolved. Having bowel movements.   Acute on chronic respiratory failure with hypoxia and hypercapnia Per family, oxygen is a new requirement from previous admission. Patient was intubated twice this admission secondary to aspiration issues. Now on supplemental oxygen via nasal canula. -Wean room air   Acute on chronic diastolic CHF. Patient is 15 L positive during the hospital stay. Monitor volume status.   Dysphagia Secondary to critical illness. Cortrak placed and patient was on tube feeds. -SLP recommends advancing to regular diet. We will discontinue core track and monitor.   GERD -Continue Protonix   OSA Initially on BiPAP which was discontinued secondary to aspiration.   Hypernatremia Resolved.   Chronic diastolic heart failure Stable. Euvolemic.   Primary hypertension -Continue metoprolol   Hyperlipidemia -Continue Lipitor   Asymptomatic bacteruria Treated with fosfomycin   Morbid obesity Body mass index is 42.07 kg/m.  Placing the pt at higher risk of poor outcomes.  Subjective: Continues to have confusion and delusion.  Talking about  underground people who are in the room.  No nausea no vomiting.  Physical Exam: Vitals:   03/03/22 0315 03/03/22 0826 03/03/22 1123 03/03/22 1502  BP: 132/80 109/69 126/79 120/75  Pulse: 92 96 86 89  Resp: 20 (!) 25 20 17   Temp: 98.6 F (37 C) 98.5 F (36.9 C) 98.7  F (37.1 C) 98.6 F (37 C)  TempSrc: Oral Oral Oral Oral  SpO2: 98% 93% 99% 97%  Weight:      Height:       General: Appear in mild distress; no visible Abnormal Neck Mass Or lumps, Conjunctiva normal Cardiovascular: S1 and S2 Present, no Murmur, Respiratory: good respiratory effort, Bilateral Air entry present and CTA, no Crackles, no wheezes Abdomen: Bowel Sound present, Non tender  Extremities: bilateral Pedal edema Neurology: alert and oriented to time, place, and person Gait not checked due to patient safety concerns   Data Reviewed: I have Reviewed nursing notes, Vitals, and Lab results since pt's last encounter. Pertinent lab results CBC and BMP I have ordered test including CBC and BMP I have discussed pt's care plan and test results with neurology.   Family Communication: None at bedside.  Discussed with the husband yesterday.  Disposition: Status is: Inpatient Remains inpatient appropriate because: Receiving IVIG.  Monitor response.  Author: Berle Mull, MD 03/03/2022 7:44 PM  Please look on [email or web address] to find out who is on call.

## 2022-03-03 NOTE — Plan of Care (Signed)
  Problem: Health Behavior/Discharge Planning: Goal: Ability to manage health-related needs will improve Outcome: Progressing   Problem: Clinical Measurements: Goal: Ability to maintain clinical measurements within normal limits will improve Outcome: Progressing Goal: Will remain free from infection Outcome: Progressing Goal: Diagnostic test results will improve Outcome: Progressing Goal: Respiratory complications will improve Outcome: Progressing   Problem: Nutrition: Goal: Adequate nutrition will be maintained Outcome: Progressing   Problem: Coping: Goal: Level of anxiety will decrease Outcome: Progressing   Problem: Elimination: Goal: Will not experience complications related to bowel motility Outcome: Progressing Goal: Will not experience complications related to urinary retention Outcome: Progressing   Problem: Pain Managment: Goal: General experience of comfort will improve Outcome: Progressing   Problem: Safety: Goal: Ability to remain free from injury will improve Outcome: Progressing   Problem: Skin Integrity: Goal: Risk for impaired skin integrity will decrease Outcome: Progressing   Problem: Education: Goal: Knowledge of General Education information will improve Description: Including pain rating scale, medication(s)/side effects and non-pharmacologic comfort measures Outcome: Progressing   Problem: Health Behavior/Discharge Planning: Goal: Ability to manage health-related needs will improve Outcome: Progressing   Problem: Clinical Measurements: Goal: Ability to maintain clinical measurements within normal limits will improve Outcome: Progressing Goal: Will remain free from infection Outcome: Progressing Goal: Diagnostic test results will improve Outcome: Progressing Goal: Respiratory complications will improve Outcome: Progressing Goal: Cardiovascular complication will be avoided Outcome: Progressing   Problem: Activity: Goal: Risk for  activity intolerance will decrease Outcome: Progressing   Problem: Nutrition: Goal: Adequate nutrition will be maintained Outcome: Progressing   Problem: Coping: Goal: Level of anxiety will decrease Outcome: Progressing   Problem: Elimination: Goal: Will not experience complications related to bowel motility Outcome: Progressing Goal: Will not experience complications related to urinary retention Outcome: Progressing   Problem: Pain Managment: Goal: General experience of comfort will improve Outcome: Progressing   Problem: Safety: Goal: Ability to remain free from injury will improve Outcome: Progressing   Problem: Skin Integrity: Goal: Risk for impaired skin integrity will decrease Outcome: Progressing   Problem: Urinary Elimination: Goal: Signs and symptoms of infection will decrease Outcome: Progressing

## 2022-03-03 NOTE — NC FL2 (Signed)
[place] MEDICAID FL2 LEVEL OF CARE SCREENING TOOL     IDENTIFICATION  Patient Name: Tamara Russell Birthdate: [birth year] Sex: female Admission Date (Current Location): 02/06/2022  [facility] [employer] and [place] Number:  Producer, television/film/video and Address:  [facility], [address], [place], [place] [address]      Provider Number: [id number]  Attending Physician Name and Address:  Rolly Salter, MD  Relative Name and Phone Number:       Current Level of Care: Hospital Recommended Level of Care: Skilled Nursing Facility Prior Approval Number:    Date Approved/Denied:   PASRR Number: [id number]  Discharge Plan: SNF    Current Diagnoses: Patient Active Problem List   Diagnosis Date Noted   Delirium due to another medical condition 02/28/2022   Encephalopathy    Acute metabolic encephalopathy 02/06/2022   Microcytosis 02/06/2022   Diastolic dysfunction 02/06/2022   Bradycardia    Sinus pause    Tachycardia    History of UTI    Hypokalemia    Hypomagnesemia    Chronic heart failure with preserved ejection fraction (HFpEF) (HCC)    Elevated troponin level not due to acute coronary syndrome    Class 3 severe obesity due to excess calories with serious comorbidity and body mass index (BMI) of 40.0 to 44.9 in adult [facility])    Elevated troponin 02/02/2022   AMS (altered mental status) 02/02/2022   GI bleed 11/19/2018   Acute GI bleeding 11/17/2018   Hypertension    Acute blood loss anemia    Acute gastric ulcer with hemorrhage    PUD (peptic ulcer disease)    GERD (gastroesophageal reflux disease) 11/19/2015   Acute respiratory failure with hypoxia and hypercapnia (HCC) 11/19/2015   AKI (acute kidney injury) (HCC) 11/19/2015   Acute bronchitis 11/19/2015    Orientation RESPIRATION BLADDER Height & Weight     Self, Time, Place  O2 ([place] 3L) Incontinent Weight: 222 lb 10.6 oz (101 kg) Height:  5\' 1"  (154.9 cm)  BEHAVIORAL SYMPTOMS/MOOD  NEUROLOGICAL BOWEL NUTRITION STATUS      Incontinent Diet (heart healthy)  AMBULATORY STATUS COMMUNICATION OF NEEDS Skin   Extensive Assist Verbally Skin abrasions (open wound, groin, silicone dressing: change PRN; open wound, right buttock, foam dressing: lift every shift to assess and change PRN)                       Personal Care Assistance Level of Assistance  Bathing, Feeding, Dressing Bathing Assistance: Maximum assistance Feeding assistance: Limited assistance Dressing Assistance: Maximum assistance     Functional Limitations Info  Sight, Hearing Sight Info: Impaired Hearing Info: Impaired      SPECIAL CARE FACTORS FREQUENCY  PT (By licensed PT), OT (By licensed OT)     PT Frequency: 5x/wk OT Frequency: 5x/wk            Contractures Contractures Info: Not present    Additional Factors Info  Code Status, Allergies Code Status Info: Full Allergies Info: Penicillins           Current Medications (03/03/2022):  This is the current hospital active medication list Current Facility-Administered Medications  Medication Dose Route Frequency Provider Last Rate Last Admin   0.9 %  sodium chloride infusion  250 mL Intravenous Continuous 03/05/2022, NP   Paused at 02/27/22 1532   acetaminophen (TYLENOL) tablet 650 mg  650 mg Oral Q6H PRN 03/01/22, MD   650 mg  at 03/02/22 1744   Or   acetaminophen (TYLENOL) suppository 650 mg  650 mg Rectal Q6H PRN Rolly Salter, MD       albuterol (PROVENTIL) (2.5 MG/3ML) 0.083% nebulizer solution 2.5 mg  2.5 mg Nebulization Q6H PRN Madelyn Flavors A, MD   2.5 mg at 02/24/22 0801   aspirin EC tablet 81 mg  81 mg Oral Daily Rolly Salter, MD   81 mg at 03/03/22 1012   atorvastatin (LIPITOR) tablet 40 mg  40 mg Oral QHS Rolly Salter, MD   40 mg at 03/02/22 2142   chlorhexidine (PERIDEX) 0.12 % solution 15 mL  15 mL Mouth Rinse BID Omar Person, MD   15 mL at 03/02/22 2142   Chlorhexidine Gluconate Cloth 2 %  PADS 6 each  6 each Topical Daily Leslye Peer, MD   6 each at 03/02/22 1700   enoxaparin (LOVENOX) injection 40 mg  40 mg Subcutaneous Q24H Rolly Salter, MD   40 mg at 03/02/22 1146   hydrocortisone (ANUSOL-HC) suppository 25 mg  25 mg Rectal BID PRN Maretta Bees, MD       LORazepam (ATIVAN) injection 0.5 mg  0.5 mg Intravenous Q4H PRN Simonne Martinet, NP       MEDLINE mouth rinse  15 mL Mouth Rinse q12n4p Omar Person, MD   15 mL at 03/02/22 1700   melatonin tablet 3 mg  3 mg Oral QHS PRN Gleason, Darcella Gasman, PA-C   3 mg at 03/01/22 2103   metoprolol succinate (TOPROL-XL) 24 hr tablet 12.5 mg  12.5 mg Oral Daily Rolly Salter, MD   12.5 mg at 03/03/22 1012   QUEtiapine (SEROQUEL) tablet 50 mg  50 mg Oral QHS PRN Charlott Holler, MD       sodium chloride flush (NS) 0.9 % injection 10-40 mL  10-40 mL Intracatheter Q12H Lorin Glass, MD   10 mL at 03/02/22 2143   sodium chloride flush (NS) 0.9 % injection 10-40 mL  10-40 mL Intracatheter PRN Lorin Glass, MD   10 mL at 02/21/22 2139   sodium chloride flush (NS) 0.9 % injection 3 mL  3 mL Intravenous Q12H Smith, Rondell A, MD   3 mL at 03/02/22 2143   thiamine tablet 100 mg  100 mg Oral Daily Rolly Salter, MD   100 mg at 03/03/22 1012     Discharge Medications: Please see discharge summary for a list of discharge medications.  Relevant Imaging Results:  Relevant Lab Results:   Additional Information SS#: [id number]  Baldemar Lenis, LCSW

## 2022-03-03 NOTE — Procedures (Signed)
Patient Name: Tamara Russell  MRN: [id number]  Epilepsy Attending: Charlsie Quest  Referring Physician/Provider: Caryl Pina, MD Date: 03/03/2022 Duration: 24.39 mins  Patient history: 61yo F with ams. EEG to evaluate for seizure  Level of alertness: Awake  AEDs during EEG study: None  Technical aspects: This EEG study was done with scalp electrodes positioned according to the 10-20 International system of electrode placement. Electrical activity was acquired at a sampling rate of 500Hz  and reviewed with a high frequency filter of 70Hz  and a low frequency filter of 1Hz . EEG data were recorded continuously and digitally stored.   Description: No clear posterior dominant rhythm was seen.  EEG showed continuous generalized 5 to 7 Hz theta slowing admixed with intermittent generalized 2-3Hz  delta slowing. Hyperventilation and photic stimulation were not performed.     ABNORMALITY - Continuous slow, generalized  IMPRESSION: This study is suggestive of moderate diffuse encephalopathy, nonspecific etiology. No seizures or epileptiform discharges were seen throughout the recording.  Meilech Virts 

## 2022-03-03 NOTE — Progress Notes (Signed)
Nutrition Follow-up  DOCUMENTATION CODES:   Obesity unspecified  INTERVENTION:   Ensure Enlive po BID, each supplement provides 350 kcal and 20 grams of protein. MVI with minerals daily.  NUTRITION DIAGNOSIS:   Inadequate oral intake related to acute illness as evidenced by NPO status.  Ongoing   GOAL:   Patient will meet greater than or equal to 90% of their needs  Progressing   MONITOR:   Vent status, Labs, Weight trends, TF tolerance  REASON FOR ASSESSMENT:   Ventilator, Other (Comment) ([place] Consult)    ASSESSMENT:   61 yo female admitted with acute psychosis/delerium, developed acute on chronic respiratory failure and required transfer to ICU and intubation.  PMH includes HTN, GERD, hx of COVID-19 infection  Spoke with patient and husband at bedside. Patient has been eating poorly up until breakfast this morning when she ate 100%. She likes chocolate Ensure supplements, RD to add between meals to ensure adequate protein and calorie intake.  Patient hopeful for d/c home soon.   Labs reviewed.  CBG: [id number]  Medications reviewed and include thiamine.  Diet Order:   Diet Order             Diet Heart Room service appropriate? Yes; Fluid consistency: Thin  Diet effective now                   EDUCATION NEEDS:   Not appropriate for education at this time  Skin:  Skin Assessment: Skin Integrity Issues: Skin Integrity Issues:: Other (Comment), Incisions Incisions: R buttocks, L groin Other: MARSI R labia  Last BM:  5/15 type 6  Height:   Ht Readings from Last 1 Encounters:  02/18/22 5\' 1"  (1.549 m)    Weight:   Wt Readings from Last 1 Encounters:  02/27/22 101 kg    BMI:  Body mass index is 42.07 kg/m.  Estimated Nutritional Needs:   Kcal:  1550-1750 kcals  Protein:  85-105g  Fluid:  >/= 1.5 L    03/01/22 RD, LDN, CNSC Please refer to Amion for contact information.

## 2022-03-03 NOTE — Progress Notes (Signed)
ABI's have been completed. Preliminary results can be found in CV Proc through chart review.   03/03/22 4:37 PM Olen Cordial RVT

## 2022-03-03 NOTE — Progress Notes (Signed)
Neurology Progress Note   S:// Patient reported by primary as being delirious through out the night and AM. She was  reported with swings of agitation and needed reorientation.  Patient @ this time is sitting in bed and conversing with her grand son @ the bedside . Not actively hallucinating   O:// Current vital signs: BP 120/75 (BP Location: Left Arm)   Pulse 89   Temp 98.6 F (37 C) (Oral)   Resp 17   Ht 5\' 1"  (1.549 m)   Wt 101 kg   SpO2 97%   BMI 42.07 kg/m  Vital signs in last 24 hours: Temp:  [98.2 F (36.8 C)-99.3 F (37.4 C)] 98.6 F (37 C) (05/19 1502) Pulse Rate:  [86-98] 89 (05/19 1502) Resp:  [17-25] 17 (05/19 1502) BP: (109-144)/(69-82) 120/75 (05/19 1502) SpO2:  [91 %-99 %] 97 % (05/19 1502)  NEURO:  Mental Status: AA&O x2 (Person/Place) not year. She will answer some simple questions and @ time pauses to answer questions. She was able to follow commands such as and specifically asked to show 2 fingers on right or left hand. She had difficulty with instructions to place right thumb on left ear lobe. She basically stared at her fingers. She did recall her correct age and DOB. She could not recall her Childrens names . She did recall her grandchildren's name.  Language: speech is Normal, Naming, repetition is intact.   Cranial Nerves: PERRL 3 mm/brisk. EOMI, visual fields full, no facial asymmetry, facial sensation intact, hearing intact, tongue/uvula/soft palate midline. Motor: No drift upper extremity            RUE 5-/5 RLE 3/5 Poor Effort            LUE 5-/5  LLE  3/5 Poor Effort  Tone: is normal and bulk is normal Sensation- Intact to light touch bilaterally x 4 Gait- deferred due to patients condition    Medications  Current Facility-Administered Medications:    0.9 %  sodium chloride infusion, 250 mL, Intravenous, Continuous, 02-13-1978, NP, Paused at 02/27/22 1532   acetaminophen (TYLENOL) tablet 650 mg, 650 mg, Oral, Q6H PRN, 650 mg at 03/02/22  1744 **OR** acetaminophen (TYLENOL) suppository 650 mg, 650 mg, Rectal, Q6H PRN, 03/04/22, MD   albuterol (PROVENTIL) (2.5 MG/3ML) 0.083% nebulizer solution 2.5 mg, 2.5 mg, Nebulization, Q6H PRN, Rolly Salter, Rondell A, MD, 2.5 mg at 02/24/22 0801   aspirin EC tablet 81 mg, 81 mg, Oral, Daily, 04/26/22, MD, 81 mg at 03/03/22 1012   atorvastatin (LIPITOR) tablet 40 mg, 40 mg, Oral, QHS, 03/05/22, MD, 40 mg at 03/02/22 2142   chlorhexidine (PERIDEX) 0.12 % solution 15 mL, 15 mL, Mouth Rinse, BID, 2143, MD, 15 mL at 03/03/22 1030   Chlorhexidine Gluconate Cloth 2 % PADS 6 each, 6 each, Topical, Daily, 03/05/22, MD, 6 each at 03/03/22 1000   enoxaparin (LOVENOX) injection 40 mg, 40 mg, Subcutaneous, Q24H, 03/05/22, MD, 40 mg at 03/03/22 1314   feeding supplement (ENSURE ENLIVE / ENSURE PLUS) liquid 237 mL, 237 mL, Oral, BID BM, 03/05/22, MD, 237 mL at 03/03/22 1314   hydrocortisone (ANUSOL-HC) suppository 25 mg, 25 mg, Rectal, BID PRN, Ghimire, 03/05/22, MD   LORazepam (ATIVAN) injection 0.5 mg, 0.5 mg, Intravenous, Q4H PRN, Werner Lean, NP   MEDLINE mouth rinse, 15 mL, Mouth Rinse, q12n4p, Simonne Martinet, MD, 15 mL at 03/03/22 1313  melatonin tablet 3 mg, 3 mg, Oral, QHS PRN, Gleason, Darcella Gasman, PA-C, 3 mg at 03/01/22 2103   metoprolol succinate (TOPROL-XL) 24 hr tablet 12.5 mg, 12.5 mg, Oral, Daily, Rolly Salter, MD, 12.5 mg at 03/03/22 1012   multivitamin with minerals tablet 1 tablet, 1 tablet, Oral, Daily, Rolly Salter, MD, 1 tablet at 03/03/22 1321   QUEtiapine (SEROQUEL) tablet 50 mg, 50 mg, Oral, QHS PRN, Charlott Holler, MD   sodium chloride flush (NS) 0.9 % injection 10-40 mL, 10-40 mL, Intracatheter, Q12H, Lorin Glass, MD, 10 mL at 03/03/22 1313   sodium chloride flush (NS) 0.9 % injection 10-40 mL, 10-40 mL, Intracatheter, PRN, Lorin Glass, MD, 10 mL at 02/21/22 2139   sodium chloride flush (NS) 0.9 % injection 3 mL, 3  mL, Intravenous, Q12H, Smith, Rondell A, MD, 3 mL at 03/03/22 1313   thiamine tablet 100 mg, 100 mg, Oral, Daily, Rolly Salter, MD, 100 mg at 03/03/22 1012  Labs CBC    Component Value Date/Time   WBC 11.4 (H) 03/03/2022 0344   RBC 5.19 (H) 03/03/2022 0344   HGB 12.3 03/03/2022 0344   HCT 40.4 03/03/2022 0344   PLT 320 03/03/2022 0344   MCV 77.8 (L) 03/03/2022 0344   MCH 23.7 (L) 03/03/2022 0344   MCHC 30.4 03/03/2022 0344   RDW 18.6 (H) 03/03/2022 0344   LYMPHSABS 1.2 02/13/2022 0930   MONOABS 0.7 02/13/2022 0930   EOSABS 0.0 02/13/2022 0930   BASOSABS 0.1 02/13/2022 0930    CMP     Component Value Date/Time   NA 140 03/03/2022 0344   NA 140 03/05/2017 1449   K 4.3 03/03/2022 0344   CL 98 03/03/2022 0344   CO2 34 (H) 03/03/2022 0344   GLUCOSE 91 03/03/2022 0344   BUN 11 03/03/2022 0344   BUN 7 03/05/2017 1449   CREATININE 0.51 03/03/2022 0344   CREATININE 0.57 04/21/2016 1528   CALCIUM 9.0 03/03/2022 0344   PROT 7.3 02/13/2022 0930   ALBUMIN 3.3 (L) 02/13/2022 0930   AST 28 02/13/2022 0930   ALT 22 02/13/2022 0930   ALKPHOS 51 02/13/2022 0930   BILITOT 0.5 02/13/2022 0930   GFRNONAA >60 03/03/2022 0344   GFRNONAA >89 04/01/2015 1608   GFRAA >60 12/03/2019 2032   GFRAA >89 04/01/2015 1608    glycosylated hemoglobin  Lipid Panel     Component Value Date/Time   CHOL 163 02/02/2022 0424   TRIG 84 02/25/2022 0341   HDL 78 02/02/2022 0424   CHOLHDL 2.1 02/02/2022 0424   VLDL 5 02/02/2022 0424   LDLCALC 80 02/02/2022 0424       Assessment: 61 y.o. female with PMH of Acid reflux, Complication of anesthesia, History of COVID-19, and Hypertension, who [facility] on [facility] with acute encephalopathy with associated hallucination and paranoia complicated by hypercarbic respiratory failure, who has had a short course of MV and is s/p lumbar puncture.  She developed worsening respiratory function overnight 5/11 requiring intubation and mechanical ventilation.   Lumbar puncture completed 02/20/22.CSF revealed evidence of pleocytosis. Autoimmune encephalopathy panel is pending Patient was extubated on 02/27/22 with improved mental status.  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 Will initiate a 5 day course of 400 mg/kg/day of IVIG therapy this evening  - Neurology team will continue to follow  Addendum: - EEG from today: Continuous generalized slowing. The EEG findings are suggestive of moderate diffuse encephalopathy, nonspecific to etiology. No seizures or epileptiform discharges were seen throughout the recording.  -- Camillo FlamingNader Cyrus Emami , PA-C Neurohospitalist [employer] [facility]   Electronically signed: Dr. Caryl PinaEric Aneta Hendershott

## 2022-03-03 NOTE — TOC Progression Note (Signed)
Transition of Care [facility]) - Progression Note    Patient Details  Name: Tamara Russell MRN: [id number] Date of Birth: Jul 14, 1961  [facility] CM/SW [place], [place] Phone Number: 03/03/2022, 3:46 PM  Clinical Narrative:   CSW met with patient and spouse at bedside to discuss recommendation for SNF. Spouse in agreement, with patient saying "NO" but then talking about the nurse's hair color and not seeming to understand what was being discussed. Spouse asked about CIR, and CSW explained that wasn't currently recommended, and spouse in agreement with whatever will get the patient better and stronger. CSW received permission to fax out referral, will follow with bed offers. CSW asked CIR to screen for possibility of admission, per spouse's request. CSW to follow.    Expected Discharge Plan: Skilled Nursing Facility Barriers to Discharge: Continued Medical Work up, Ship broker  Expected Discharge Plan and Services Expected Discharge Plan: [place]   Discharge Planning Services: CM Consult                                           Social Determinants of Health (SDOH) Interventions    Readmission Risk Interventions     View : No data to display.

## 2022-03-04 DIAGNOSIS — F05 Delirium due to known physiological condition: Secondary | ICD-10-CM | POA: Diagnosis not present

## 2022-03-04 DIAGNOSIS — K567 Ileus, unspecified: Secondary | ICD-10-CM | POA: Diagnosis present

## 2022-03-04 DIAGNOSIS — R1310 Dysphagia, unspecified: Secondary | ICD-10-CM

## 2022-03-04 DIAGNOSIS — J690 Pneumonitis due to inhalation of food and vomit: Secondary | ICD-10-CM | POA: Diagnosis present

## 2022-03-04 DIAGNOSIS — J9602 Acute respiratory failure with hypercapnia: Secondary | ICD-10-CM | POA: Diagnosis not present

## 2022-03-04 DIAGNOSIS — G0481 Other encephalitis and encephalomyelitis: Secondary | ICD-10-CM | POA: Diagnosis present

## 2022-03-04 DIAGNOSIS — G4733 Obstructive sleep apnea (adult) (pediatric): Secondary | ICD-10-CM | POA: Diagnosis present

## 2022-03-04 DIAGNOSIS — J9601 Acute respiratory failure with hypoxia: Secondary | ICD-10-CM | POA: Diagnosis not present

## 2022-03-04 LAB — BASIC METABOLIC PANEL
Anion gap: 5 (ref 5–15)
BUN: 12 mg/dL (ref 8–23)
CO2: 34 mmol/L — ABNORMAL HIGH (ref 22–32)
Calcium: 8.6 mg/dL — ABNORMAL LOW (ref 8.9–10.3)
Chloride: 98 mmol/L (ref 98–111)
Creatinine, Ser: 0.45 mg/dL (ref 0.44–1.00)
GFR, Estimated: >60 mL/min (ref >60)
Glucose, Bld: 121 mg/dL — ABNORMAL HIGH (ref 70–99)
Potassium: 3.9 mmol/L (ref 3.5–5.1)
Sodium: 137 mmol/L (ref 135–145)

## 2022-03-04 LAB — CBC
HCT: 34.2 % — ABNORMAL LOW (ref 36.0–46.0)
Hemoglobin: 10.3 g/dL — ABNORMAL LOW (ref 12.0–15.0)
MCH: 23.7 pg — ABNORMAL LOW (ref 26.0–34.0)
MCHC: 30.1 g/dL (ref 30.0–36.0)
MCV: 78.6 fL — ABNORMAL LOW (ref 80.0–100.0)
Platelets: 306 10*3/uL (ref 150–400)
RBC: 4.35 MIL/uL (ref 3.87–5.11)
RDW: 18.2 % — ABNORMAL HIGH (ref 11.5–15.5)
WBC: 13.4 10*3/uL — ABNORMAL HIGH (ref 4.0–10.5)
nRBC: 0 % (ref 0.0–0.2)

## 2022-03-04 LAB — MAGNESIUM: Magnesium: 1.7 mg/dL (ref 1.7–2.4)

## 2022-03-04 MED ORDER — MUSCLE RUB 10-15 % EX CREA
TOPICAL_CREAM | CUTANEOUS | Status: DC | PRN
  Filled 2022-03-04 (×2): qty 85

## 2022-03-04 MED ORDER — FUROSEMIDE 10 MG/ML IJ SOLN
20.0000 mg | Freq: Once | INTRAMUSCULAR | Status: AC
  Administered 2022-03-04: 20 mg via INTRAVENOUS
  Filled 2022-03-04: qty 4

## 2022-03-04 NOTE — Plan of Care (Signed)
  Problem: Clinical Measurements: Goal: Ability to maintain clinical measurements within normal limits will improve Outcome: Progressing Goal: Will remain free from infection Outcome: Progressing Goal: Diagnostic test results will improve Outcome: Progressing Goal: Respiratory complications will improve Outcome: Progressing   Problem: Nutrition: Goal: Adequate nutrition will be maintained Outcome: Progressing   Problem: Elimination: Goal: Will not experience complications related to bowel motility Outcome: Progressing Goal: Will not experience complications related to urinary retention Outcome: Progressing   Problem: Safety: Goal: Ability to remain free from injury will improve Outcome: Progressing   Problem: Skin Integrity: Goal: Risk for impaired skin integrity will decrease Outcome: Progressing   Problem: Clinical Measurements: Goal: Ability to maintain clinical measurements within normal limits will improve Outcome: Progressing Goal: Will remain free from infection Outcome: Progressing Goal: Diagnostic test results will improve Outcome: Progressing Goal: Respiratory complications will improve Outcome: Progressing Goal: Cardiovascular complication will be avoided Outcome: Progressing   Problem: Nutrition: Goal: Adequate nutrition will be maintained Outcome: Progressing   Problem: Elimination: Goal: Will not experience complications related to bowel motility Outcome: Progressing Goal: Will not experience complications related to urinary retention Outcome: Progressing   Problem: Safety: Goal: Ability to remain free from injury will improve Outcome: Progressing   Problem: Skin Integrity: Goal: Risk for impaired skin integrity will decrease Outcome: Progressing   Problem: Urinary Elimination: Goal: Signs and symptoms of infection will decrease Outcome: Progressing

## 2022-03-04 NOTE — Progress Notes (Addendum)
Progress Note Patient: Tamara Russell [id number] DOB: 1961/09/17 DOA: 02/06/2022  DOS: the patient was seen and examined on 03/04/2022  Brief hospital course: Patient is a 61 y.o.  female morbid obesity, HTN, GERD-who was just discharged from this facility on 4/23-presented to the hospital on 4/24 with altered mental status.  Found to have metabolic encephalopathy.  Significant events: 4/19-4/23>> hospitalization for chest pain, confusion - UTI. 4/24 Presented to the ED for confusion-admit to [facility]. 4/28 Neuro eval - marital stress possibly causing AMS - psych consult recommended 5/01 Episode of aspiration - bradycardia/worsening hypoxemia - improved after suctioning.CXR with bibasilar opacities-IV Ancef started. 5/04 Neuro re-eval per psych rec's-?autoimmune process causing AMS 5/05 Trial of Depakote per neurology. 5/06 Significant sedation / lethargy following Haldol/Ativan -concern for hypercarbia -BiPAP started. LP postponed. Haldol/Ativan dosage adjusted by psychiatry. 5/07 Intubated, eeg neg neuro consulted.  5/08 LP completed. Cortrak placed. 5/09 Extubated, continues to have agitated delirium with psychosis, and then episodes of minimal responsiveness 5/12 Intubated in setting SBO and suspected aspiration 5/15 Extubated, respiratory status improving and mental status clearing 5/19 Completed antibiotic course the day before.  Started on IVIG.  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  Multiple EEGs without seizure but with ongoing encephalopathy. LP obtained without evidence of pleocytosis; autoimmune encephalopathy panel obtained and is pending. Neurology recommended IVIG for empiric treatment of autoimmune encephalitis after infections have cleared.  Psychiatry recommending no changes to current medications; per  their assessment, patient does not have capacity to make medical decisions. Metabolic work-up including TSH and free T4, cortisol level, ammonia, ethanol level, RPR, vitamin B1, vitamin B12, blood lead level, Depakote level not revealing. Repeat EEG 5/19 shows encephalopathy without any seizures.  -Continue to follow neurology/psychiatry recommendations -Continue thiamine -Continue IVIG, day 2. -Continue melatonin. -It appears that the patient was on scheduled Seroquel but it has been discontinued since 5/15.  With the patient's mentation does not improve we will discuss with psychiatry and resume Seroquel on 5/21. Acute on chronic diastolic CHF (congestive heart failure) Elevated troponin Patient is 15 L positive during the hospital stay. We will treat with IV Lasix as needed. Troponin elevation mostly demand ischemia.  No work-up necessary for now.  Lower extremity edema. We will check Doppler to rule out DVT.  UTI (urinary tract infection) Urine culture grew E. coli, Enterococcus and Citrobacter not in a significant amount. Treated with fosfomycin earlier during admission.  Later actually completed 7 days of antibiotics for aspiration pneumonia. Per psychiatry likely contributing to her delirium.  Aspiration pneumonia Dysphagia Leukocytosis Multiple episodes of aspiration.  Klebsiella isolated on ET aspirate. Completed antibiotic course for 7 days. Patient had a cortrak for tube feeding which was removed as patient was able to pass swallowing evaluation with speech therapy. Patient appears to have mild leukocytosis for last 2 days.  Chest x-ray was negative for any acute abnormality.  So far no fever.  Monitor.  Ileus ([place]) Small bowel obstruction, has been ruled out. Patient evaluated by general surgery.  Per general surgery, more likely ileus.  Patient's symptoms appear to have resolved. Having bowel movements.  Acute on chronic respiratory failure with hypoxia and  hypercapnia  Per family, oxygen is a new requirement from previous admission.  Hypercarbia mostly in the setting of obesity/OSA and sedation from medications. Patient was intubated twice this admission secondary to aspiration issues. Now on supplemental oxygen via nasal canula. -  Wean room air    PUD (peptic ulcer disease) GERD. Continuing PPI.  Obesity, Class III, BMI 40-49.9 (morbid obesity) (HCC) OSA (obstructive sleep apnea) Body mass index is 42.07 kg/m.  Placing the pt at higher risk of poor outcomes. Unable to tolerate BiPAP due to frequent aspiration.  Hypertension Blood pressure stable. Continue current regimen.  Hypokalemia Hypernatremia Resolved.  Monitor for now.  Microcytic anemia No bleeding. Iron levels checked in the past normal. Hemoglobin dropped from 12-10.  Will monitor for now.  Subjective: No nausea no vomiting no fever no chills.  Continues to have some shortness of breath.  Reported to have some left leg cramp last night resolved with Tylenol  Physical Exam: Vitals:   03/04/22 0037 03/04/22 0333 03/04/22 0815 03/04/22 1400  BP: (!) 141/80 (!) 150/79 117/68 (!) 146/94  Pulse: 96 96 89 82  Resp: 20 20 20 20   Temp:  98.8 F (37.1 C) 98.2 F (36.8 C)   TempSrc:  Oral Oral   SpO2: 100% 100% 97% 98%  Weight:      Height:       General: Appear in mild distress; no visible Abnormal Neck Mass Or lumps, Conjunctiva normal Cardiovascular: S1 and S2 Present, no Murmur, Respiratory: good respiratory effort, Bilateral Air entry present and  faint Crackles, no wheezes Abdomen: Bowel Sound present, Non tender  Extremities: trace Pedal edema Neurology: alert and oriented to time, place, and person, remain confused though Gait not checked due to patient safety concerns   Data Reviewed: I have Reviewed nursing notes, Vitals, and Lab results since pt's last encounter. Pertinent lab results CBC and BMP I have ordered test including CBC and BMP, lower  extremity Doppler I have discussed pt's care plan and test results with neurology.   Family Communication: Husband at bedside  Disposition: Status is: Inpatient Remains inpatient appropriate because: Ongoing confusion requiring IVIG therapy.  Monitor.  Author: Berle Mull, MD 03/04/2022 4:03 PM  Please look on [email or web address] to find out who is on call.

## 2022-03-05 ENCOUNTER — Inpatient Hospital Stay (HOSPITAL_COMMUNITY): Payer: 59

## 2022-03-05 DIAGNOSIS — J9602 Acute respiratory failure with hypercapnia: Secondary | ICD-10-CM | POA: Diagnosis not present

## 2022-03-05 DIAGNOSIS — G0481 Other encephalitis and encephalomyelitis: Secondary | ICD-10-CM

## 2022-03-05 DIAGNOSIS — R609 Edema, unspecified: Secondary | ICD-10-CM

## 2022-03-05 DIAGNOSIS — I82409 Acute embolism and thrombosis of unspecified deep veins of unspecified lower extremity: Secondary | ICD-10-CM | POA: Diagnosis not present

## 2022-03-05 DIAGNOSIS — J9601 Acute respiratory failure with hypoxia: Secondary | ICD-10-CM | POA: Diagnosis not present

## 2022-03-05 DIAGNOSIS — F05 Delirium due to known physiological condition: Secondary | ICD-10-CM | POA: Diagnosis not present

## 2022-03-05 DIAGNOSIS — G9341 Metabolic encephalopathy: Secondary | ICD-10-CM | POA: Diagnosis not present

## 2022-03-05 LAB — CBC
HCT: 32.7 % — ABNORMAL LOW (ref 36.0–46.0)
Hemoglobin: 9.7 g/dL — ABNORMAL LOW (ref 12.0–15.0)
MCH: 23.5 pg — ABNORMAL LOW (ref 26.0–34.0)
MCHC: 29.7 g/dL — ABNORMAL LOW (ref 30.0–36.0)
MCV: 79.4 fL — ABNORMAL LOW (ref 80.0–100.0)
Platelets: 308 10*3/uL (ref 150–400)
RBC: 4.12 MIL/uL (ref 3.87–5.11)
RDW: 18.4 % — ABNORMAL HIGH (ref 11.5–15.5)
WBC: 7.6 10*3/uL (ref 4.0–10.5)
nRBC: 0.3 % — ABNORMAL HIGH (ref 0.0–0.2)

## 2022-03-05 LAB — GLUCOSE, CAPILLARY
Glucose-Capillary: 109 mg/dL — ABNORMAL HIGH (ref 70–99)
Glucose-Capillary: 109 mg/dL — ABNORMAL HIGH (ref 70–99)
Glucose-Capillary: 119 mg/dL — ABNORMAL HIGH (ref 70–99)
Glucose-Capillary: 111 mg/dL — ABNORMAL HIGH (ref 70–99)

## 2022-03-05 LAB — BASIC METABOLIC PANEL
Anion gap: 3 — ABNORMAL LOW (ref 5–15)
BUN: 12 mg/dL (ref 8–23)
CO2: 37 mmol/L — ABNORMAL HIGH (ref 22–32)
Calcium: 8.8 mg/dL — ABNORMAL LOW (ref 8.9–10.3)
Chloride: 95 mmol/L — ABNORMAL LOW (ref 98–111)
Creatinine, Ser: 0.34 mg/dL — ABNORMAL LOW (ref 0.44–1.00)
GFR, Estimated: >60 mL/min (ref >60)
Glucose, Bld: 105 mg/dL — ABNORMAL HIGH (ref 70–99)
Potassium: 3.7 mmol/L (ref 3.5–5.1)
Sodium: 135 mmol/L (ref 135–145)

## 2022-03-05 LAB — MAGNESIUM: Magnesium: 1.7 mg/dL (ref 1.7–2.4)

## 2022-03-05 MED ORDER — HEPARIN BOLUS VIA INFUSION
4000.0000 [IU] | Freq: Once | INTRAVENOUS | Status: AC
  Administered 2022-03-05: 4000 [IU] via INTRAVENOUS
  Filled 2022-03-05: qty 4000

## 2022-03-05 MED ORDER — HEPARIN (PORCINE) 25000 UT/250ML-% IV SOLN
1150.0000 [IU]/h | INTRAVENOUS | Status: DC
  Administered 2022-03-05 – 2022-03-06 (×2): 1150 [IU]/h via INTRAVENOUS
  Filled 2022-03-05 (×2): qty 250

## 2022-03-05 NOTE — Progress Notes (Signed)
ANTICOAGULATION CONSULT NOTE - Initial Consult  Pharmacy Consult for Heparin Indication: DVT  Allergies  Allergen Reactions   Penicillins Other (See Comments)    Childhood allergy. Pt does not remember reaction Tolerated cefazolin 5/1-5/6, cefepime 5/12-5/14    Patient Measurements: Height: 5\' 1"  (154.9 cm) Weight: 101 kg (222 lb 10.6 oz) IBW/kg (Calculated) : 47.8 Heparin Dosing Weight: 72 kg  Vital Signs: Temp: 98 F (36.7 C) (05/21 1500) Temp Source: Oral (05/21 1500) BP: 140/80 (05/21 1500) Pulse Rate: 79 (05/21 1500)  Labs: Recent Labs    03/03/22 0344 03/04/22 0423 03/05/22 0430  HGB 12.3 10.3* 9.7*  HCT 40.4 34.2* 32.7*  PLT 320 306 308  CREATININE 0.51 0.45 0.34*    Estimated Creatinine Clearance: 78.5 mL/min (A) (by C-G formula based on SCr of 0.34 mg/dL (L)).   Medical History: Past Medical History:  Diagnosis Date   Acid reflux    Complication of anesthesia    slow to wake up   History of COVID-19    Hypertension     Medications:  Medications Prior to Admission  Medication Sig Dispense Refill Last Dose   acetaminophen (TYLENOL) 500 MG tablet Take 1 tablet (500 mg total) by mouth every 6 (six) hours as needed for moderate pain. Reported on 04/21/2016 30 tablet 0 02/05/2022   aspirin EC 81 MG tablet Take 81 mg by mouth daily as needed ("overworking heart").   Past Week   Multiple Vitamins-Minerals (CENTRUM SILVER ADULT 50+ PO) Take 1 tablet by mouth daily.   Past Week   atorvastatin (LIPITOR) 40 MG tablet Take 1 tablet (40 mg total) by mouth daily. (Patient not taking: Reported on 02/06/2022) 30 tablet 0 Not Taking   [EXPIRED] cephALEXin (KEFLEX) 500 MG capsule Take 1 capsule (500 mg total) by mouth 4 (four) times daily for 4 days. (Patient not taking: Reported on 02/06/2022) 16 capsule 0 Not Taking   metoprolol succinate (TOPROL-XL) 25 MG 24 hr tablet Take 0.5 tablets (12.5 mg total) by mouth every morning for 30 doses. (Patient not taking: Reported on  02/06/2022) 15 tablet 0 Not Taking   spironolactone (ALDACTONE) 25 MG tablet Take 1 tablet (25 mg total) by mouth daily. (Patient not taking: Reported on 02/06/2022) 30 tablet 0 Not Taking    Assessment: 61 y.o. F presented with AMS. Pt found to have LE DVT on [place]. To begin heparin per pharmacy. No AC PTA. Pt has been on VTE prophylaxis with heparin then Lovenox since admission. Last Lovenox 40mg  given  today at ~1400. Hgb down to 9.7, no overt bleeding. Plt wnl.  Goal of Therapy:  Heparin level 0.3-0.7 units/ml Monitor platelets by anticoagulation protocol: Yes   Plan:  Heparin IV bolus 4000 units Heparin gtt at 1150 units/hr Will f/u heparin level in 8 hours Daily heparin level and CBC  Sherlon Handing, PharmD, BCPS Please see amion for complete clinical pharmacist phone list 03/05/2022,6:45 PM

## 2022-03-05 NOTE — Progress Notes (Signed)
Neurology Progress Note   S:// Patient reported by primary RN to have improved mental status . She did have some delirium late last night and reoriented by staff. He daughter is at the bedside, reporting she has improved tremendously and had been conversant most of the day yesterday. She is close to baseline per family.   O:// Current vital signs: BP 138/71 (BP Location: Right Arm)   Pulse 81   Temp 98.6 F (37 C) (Oral)   Resp 17   Ht 5\' 1"  (1.549 m)   Wt 101 kg   SpO2 100%   BMI 42.07 kg/m  Vital signs in last 24 hours: Temp:  [97.4 F (36.3 C)-98.8 F (37.1 C)] 98.6 F (37 C) (05/21 1102) Pulse Rate:  [59-110] 81 (05/21 1102) Resp:  [12-41] 17 (05/21 1102) BP: (85-150)/(71-94) 138/71 (05/21 1102) SpO2:  [90 %-100 %] 100 % (05/21 1102)  NEURO:  Mental Status: AA&O x2 (Person/Place) not year. She will answer simple questions .She follows commands. She did recall her correct age and DOB and also introduced her daughter at the bedside.. Language: speech is Normal, Naming, repetition is intact.   Cranial Nerves: PERRL 3 mm/brisk. EOMI, visual fields full, no facial asymmetry, facial sensation intact, hearing intact, tongue/uvula midline. Motor: No drift upper extremities            RUE 5-/5 RLE 3/5 Poor Effort            LUE 5-/5  LLE  3/5 Poor Effort  Tone and bulk are normal Sensation- Intact to light touch bilaterally x 4 Gait- Deferred due to patient's condition    Medications  Current Facility-Administered Medications:    0.9 %  sodium chloride infusion, 250 mL, Intravenous, Continuous, 02-08-2007, NP, Paused at 02/27/22 1532   acetaminophen (TYLENOL) tablet 650 mg, 650 mg, Oral, Q6H PRN, 650 mg at 03/04/22 2136 **OR** acetaminophen (TYLENOL) suppository 650 mg, 650 mg, Rectal, Q6H PRN, 2137, MD   albuterol (PROVENTIL) (2.5 MG/3ML) 0.083% nebulizer solution 2.5 mg, 2.5 mg, Nebulization, Q6H PRN, Rolly Salter, Rondell A, MD, 2.5 mg at 02/24/22 0801   aspirin  EC tablet 81 mg, 81 mg, Oral, Daily, 04/26/22, MD, 81 mg at 03/05/22 0943   atorvastatin (LIPITOR) tablet 40 mg, 40 mg, Oral, QHS, 03/07/22, MD, 40 mg at 03/04/22 2131   chlorhexidine (PERIDEX) 0.12 % solution 15 mL, 15 mL, Mouth Rinse, BID, 2132, MD, 15 mL at 03/05/22 0943   Chlorhexidine Gluconate Cloth 2 % PADS 6 each, 6 each, Topical, Daily, 03/07/22, MD, 6 each at 03/05/22 0943   enoxaparin (LOVENOX) injection 40 mg, 40 mg, Subcutaneous, Q24H, 03/07/22, MD, 40 mg at 03/04/22 1146   feeding supplement (ENSURE ENLIVE / ENSURE PLUS) liquid 237 mL, 237 mL, Oral, BID BM, 03/06/22, MD, 237 mL at 03/04/22 1438   hydrocortisone (ANUSOL-HC) suppository 25 mg, 25 mg, Rectal, BID PRN, Ghimire, 03/06/22, MD   Immune Globulin 10% (PRIVIGEN) IV infusion 40 g, 400 mg/kg, Intravenous, Q24 Hr x 5, Cassidy Tashiro, MD, 40 g at 03/04/22 1951   LORazepam (ATIVAN) injection 0.5 mg, 0.5 mg, Intravenous, Q4H PRN, 03/06/22, NP   MEDLINE mouth rinse, 15 mL, Mouth Rinse, q12n4p, Simonne Martinet, MD, 15 mL at 03/04/22 1438   melatonin tablet 3 mg, 3 mg, Oral, QHS PRN, Gleason, 03/06/22, PA-C, 3 mg at 03/04/22 2136   metoprolol succinate (TOPROL-XL) 24 hr  tablet 12.5 mg, 12.5 mg, Oral, Daily, Rolly SalterPatel, Pranav M, MD, 12.5 mg at 03/05/22 [id number]   multivitamin with minerals tablet 1 tablet, 1 tablet, Oral, Daily, Rolly SalterPatel, Pranav M, MD, 1 tablet at 03/05/22 [id number]   Muscle Rub CREA, , Topical, PRN, Rolly SalterPatel, Pranav M, MD, Given at 03/04/22 2321   sodium chloride flush (NS) 0.9 % injection 10-40 mL, 10-40 mL, Intracatheter, Q12H, Lorin GlassSmith, Daniel C, MD, 10 mL at 03/04/22 2140   sodium chloride flush (NS) 0.9 % injection 10-40 mL, 10-40 mL, Intracatheter, PRN, Lorin GlassSmith, Daniel C, MD, 10 mL at 03/04/22 1537   sodium chloride flush (NS) 0.9 % injection 3 mL, 3 mL, Intravenous, Q12H, Smith, Rondell A, MD, 3 mL at 03/04/22 2141   thiamine tablet 100 mg, 100 mg, Oral, Daily, Rolly SalterPatel, Pranav M,  MD, 100 mg at 03/05/22 0943  Labs CBC    Component Value Date/Time   WBC 7.6 03/05/2022 0430   RBC 4.12 03/05/2022 0430   HGB 9.7 (L) 03/05/2022 0430   HCT 32.7 (L) 03/05/2022 0430   PLT 308 03/05/2022 0430   MCV 79.4 (L) 03/05/2022 0430   MCH 23.5 (L) 03/05/2022 0430   MCHC 29.7 (L) 03/05/2022 0430   RDW 18.4 (H) 03/05/2022 0430   LYMPHSABS 1.2 02/13/2022 0930   MONOABS 0.7 02/13/2022 0930   EOSABS 0.0 02/13/2022 0930   BASOSABS 0.1 02/13/2022 0930    CMP     Component Value Date/Time   NA 135 03/05/2022 0430   NA 140 03/05/2017 1449   K 3.7 03/05/2022 0430   CL 95 (L) 03/05/2022 0430   CO2 37 (H) 03/05/2022 0430   GLUCOSE 105 (H) 03/05/2022 0430   BUN 12 03/05/2022 0430   BUN 7 03/05/2017 1449   CREATININE 0.34 (L) 03/05/2022 0430   CREATININE 0.57 04/21/2016 1528   CALCIUM 8.8 (L) 03/05/2022 0430   PROT 7.3 02/13/2022 0930   ALBUMIN 3.3 (L) 02/13/2022 0930   AST 28 02/13/2022 0930   ALT 22 02/13/2022 0930   ALKPHOS 51 02/13/2022 0930   BILITOT 0.5 02/13/2022 0930   GFRNONAA >60 03/05/2022 0430   GFRNONAA >89 04/01/2015 1608   GFRAA >60 12/03/2019 2032   GFRAA >89 04/01/2015 1608    glycosylated hemoglobin / Lipid Panel     Component Value Date/Time   CHOL 163 02/02/2022 0424   TRIG 84 02/25/2022 0341   HDL 78 02/02/2022 0424   CHOLHDL 2.1 02/02/2022 0424   VLDL 5 02/02/2022 0424   LDLCALC 80 02/02/2022 0424       Assessment: 61 y.o. female with PMH of Acid reflux, Complication of anesthesia, History of COVID-19, and Hypertension, who [facility] for evaluation of acute encephalopathy with associated hallucination and paranoia, complicated by hypercarbic respiratory failure. She has had a short course of MV and is s/p lumbar puncture.  She developed worsening respiratory function overnight 5/11 requiring intubation and mechanical ventilation. Lumbar puncture completed 02/20/22. CSF was clear and colorless with normal WBC and protein.  Autoimmune encephalopathy panel is pending. Patient was extubated on 02/27/22. She is now on IVIG day 3/5 with improving mental status.  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  - EEG from 03/03/22: Continuous generalized slowing. The EEG findings are suggestive of moderate diffuse encephalopathy, nonspecific to etiology. No seizures or epileptiform discharges were seen throughout the recording. - MRI brain 4/27: Parenchymal volume is normal. The  ventricles are normal in size.Gray-white differentiation is preserved. 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  Recommendations: - Neuro Checks q 4 hours - Continue Delirium Precautions - Continue Thiamine - Continue IVIG for a total of 5 treatments. Today is day 3/5  - Neurology team will continue to follow   -- Camillo Flaming , PA-C Neurohospitalist [facility]  Electronically signed: Dr. Caryl Pina

## 2022-03-05 NOTE — Progress Notes (Signed)
VASCULAR LAB    Bilateral lower extremity has been performed.  See CV proc for preliminary results.  Messaged results to Dr. Allena Katz and Grace Bushy, RN  Rosezetta Schlatter, [facility], RVT 03/05/2022, 5:36 PM

## 2022-03-05 NOTE — Progress Notes (Signed)
Progress Note Patient: Tamara Russell RXY:[id number] DOB: 12-15-1960 DOA: 02/06/2022  DOS: the patient was seen and examined on 03/05/2022  Brief hospital course: Patient is a 61 y.o.  female morbid obesity, HTN, GERD-who was just discharged from this facility on 4/23-presented to the hospital on 4/24 with altered mental status.  Found to have metabolic encephalopathy.  Significant events: 4/19-4/23>> hospitalization for chest pain, confusion - UTI. 4/24 Presented to the ED for confusion-admit to [facility]. 4/28 Neuro eval - marital stress possibly causing AMS - psych consult recommended 5/01 Episode of aspiration - bradycardia/worsening hypoxemia - improved after suctioning.CXR with bibasilar opacities-IV Ancef started. 5/04 Neuro re-eval per psych rec's-?autoimmune process causing AMS 5/05 Trial of Depakote per neurology. 5/06 Significant sedation / lethargy following Haldol/Ativan -concern for hypercarbia -BiPAP started. LP postponed. Haldol/Ativan dosage adjusted by psychiatry. 5/07 Intubated, eeg neg neuro consulted.  5/08 LP completed. Cortrak placed. 5/09 Extubated, continues to have agitated delirium with psychosis, and then episodes of minimal responsiveness 5/12 Intubated in setting SBO and suspected aspiration 5/15 Extubated, respiratory status improving and mental status clearing 5/19 Completed antibiotic course the day before.  Started on IVIG. 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  Multiple EEGs without seizure but with ongoing encephalopathy. LP obtained without evidence of pleocytosis; autoimmune encephalopathy panel obtained and is pending. Neurology recommended IVIG for empiric treatment of autoimmune encephalitis after infections have cleared.  Psychiatry recommending no changes to current medications; per  their assessment, patient does not have capacity to make medical decisions. Metabolic work-up including TSH and free T4, cortisol level, ammonia, ethanol level, RPR, vitamin B1, vitamin B12, blood lead level, Depakote level not revealing. Repeat EEG 5/19 shows encephalopathy without any seizures.   -Continue to follow neurology/psychiatry recommendations -Continue thiamine -Continue IVIG, day 2. -Continue melatonin. -We will request psychiatry to reevaluate the patient tomorrow.  Acute on chronic diastolic CHF (congestive heart failure) Elevated troponin We will treat with IV Lasix as needed. Troponin elevation mostly demand ischemia.  No work-up necessary for now.   Left distal DVT/acute Lower extremity Doppler performed due to edema and pain complaints. Appears to have left distal tibial peroneal vein DVT. Not correlating with patient's current symptoms of right leg pain. While patient remains at high risk for clot progression due to her immobility. Recommended started on IV heparin for 4 hours. Transition to p.o. based on insurance coverage.   UTI (urinary tract infection) Urine culture grew E. coli, Enterococcus and Citrobacter not in a significant amount. Treated with fosfomycin earlier during admission.  Later actually completed 7 days of antibiotics for aspiration pneumonia. Per psychiatry likely contributing to her delirium.   Aspiration pneumonia Dysphagia Leukocytosis Multiple episodes of aspiration.  Klebsiella isolated on ET aspirate. Completed antibiotic course for 7 days. Patient had a cortrak for tube feeding which was removed as patient was able to pass swallowing evaluation with speech therapy. Patient appears to have mild leukocytosis for last 2 days.  Chest x-ray was negative for any acute abnormality.  So far no fever.  Monitor.   Ileus (HCC) Small bowel obstruction, has been ruled out. Patient evaluated by general surgery.  Per general surgery, more  likely ileus.  Patient's symptoms appear to have resolved. Having bowel movements.   Acute on chronic respiratory failure with hypoxia and hypercapnia  Per family, oxygen is a new requirement from previous admission.  Hypercarbia mostly in the setting of obesity/OSA  and sedation from medications. Patient was intubated twice this admission secondary to aspiration issues. Now on supplemental oxygen via nasal canula. -Wean room air  PUD (peptic ulcer disease) GERD. Continuing PPI.   Obesity, Class III, BMI 40-49.9 (morbid obesity) (HCC) OSA (obstructive sleep apnea) Body mass index is 42.07 kg/m.  Placing the pt at higher risk of poor outcomes. Unable to tolerate BiPAP due to frequent aspiration.   Hypertension Blood pressure stable. Continue current regimen.   Hypokalemia Hypernatremia Resolved.  Monitor for now.   Microcytic anemia No bleeding. Iron levels checked in the past normal. Hemoglobin dropped from 12-10.  Will monitor for now.  Subjective: Patient remains confused, unable to follow commands although able to identify me and having a conversation on my second evaluation.  Denies any acute complaint.  Daughter at bedside reports that the patient started having more pain last night for right leg.  Physical Exam: Vitals:   03/05/22 0721 03/05/22 1102 03/05/22 1500 03/05/22 1800  BP: (!) 149/77 138/71 140/80   Pulse: 83 81 79   Resp: 17 17 17    Temp: 98.6 F (37 C) 98.6 F (37 C) 98 F (36.7 C)   TempSrc: Axillary Oral Oral   SpO2: 99% 100% 100%   Weight:    101 kg  Height:    5\' 1"  (1.549 m)   General: Appear in mild distress; no visible Abnormal Neck Mass Or lumps, Conjunctiva normal Cardiovascular: S1 and S2 Present, no Murmur, Respiratory: good respiratory effort, Bilateral Air entry present and CTA, no Crackles, no wheezes Abdomen: Bowel Sound present, Non tender  Extremities: no Pedal edema Neurology: alert and oriented to self Gait not checked due to  patient safety concerns   Data Reviewed: I have Reviewed nursing notes, Vitals, and Lab results since pt's last encounter. Pertinent lab results CBC and BMP I have ordered test including CBC and BMP I have discussed pt's care plan and test results with neurology.   Family Communication: Daughter at bedside  Disposition: Status is: Inpatient Remains inpatient appropriate because: Remaining confused requiring intervention and therapy  Author: , MD 03/05/2022 7:13 PM  Please look on [email or web address] to find out who is on call.

## 2022-03-06 ENCOUNTER — Other Ambulatory Visit (HOSPITAL_COMMUNITY): Payer: Self-pay

## 2022-03-06 DIAGNOSIS — G9341 Metabolic encephalopathy: Secondary | ICD-10-CM | POA: Diagnosis not present

## 2022-03-06 LAB — GLUCOSE, CAPILLARY
Glucose-Capillary: 93 mg/dL (ref 70–99)
Glucose-Capillary: 99 mg/dL (ref 70–99)
Glucose-Capillary: 109 mg/dL — ABNORMAL HIGH (ref 70–99)
Glucose-Capillary: 112 mg/dL — ABNORMAL HIGH (ref 70–99)
Glucose-Capillary: 142 mg/dL — ABNORMAL HIGH (ref 70–99)
Glucose-Capillary: 93 mg/dL (ref 70–99)

## 2022-03-06 LAB — CBC
HCT: 32.3 % — ABNORMAL LOW (ref 36.0–46.0)
Hemoglobin: 9.9 g/dL — ABNORMAL LOW (ref 12.0–15.0)
MCH: 24.2 pg — ABNORMAL LOW (ref 26.0–34.0)
MCHC: 30.7 g/dL (ref 30.0–36.0)
MCV: 79 fL — ABNORMAL LOW (ref 80.0–100.0)
Platelets: 299 10*3/uL (ref 150–400)
RBC: 4.09 MIL/uL (ref 3.87–5.11)
RDW: 18.6 % — ABNORMAL HIGH (ref 11.5–15.5)
WBC: 6.9 10*3/uL (ref 4.0–10.5)
nRBC: 0 % (ref 0.0–0.2)

## 2022-03-06 LAB — MISC LABCORP TEST (SEND OUT): Labcorp test code: 9985

## 2022-03-06 LAB — HEPARIN LEVEL (UNFRACTIONATED)
Heparin Unfractionated: 0.56 IU/mL (ref 0.30–0.70)
Heparin Unfractionated: 0.6 IU/mL (ref 0.30–0.70)

## 2022-03-06 MED ORDER — APIXABAN 5 MG PO TABS
10.0000 mg | ORAL_TABLET | Freq: Two times a day (BID) | ORAL | Status: AC
  Administered 2022-03-06 – 2022-03-12 (×14): 10 mg via ORAL
  Filled 2022-03-06 (×14): qty 2

## 2022-03-06 MED ORDER — APIXABAN 5 MG PO TABS
5.0000 mg | ORAL_TABLET | Freq: Two times a day (BID) | ORAL | Status: DC
  Administered 2022-03-13 – 2022-03-17 (×9): 5 mg via ORAL
  Filled 2022-03-06 (×9): qty 1

## 2022-03-06 MED ORDER — ROPINIROLE HCL 0.25 MG PO TABS
0.2500 mg | ORAL_TABLET | Freq: Every day | ORAL | Status: DC
  Administered 2022-03-06 – 2022-03-07 (×2): 0.25 mg via ORAL
  Filled 2022-03-06 (×2): qty 1

## 2022-03-06 NOTE — Progress Notes (Signed)
Physical Therapy Treatment Patient Details Name: Tamara Russell MRN: [id number] DOB: Oct 03, 1961 Today's Date: 03/06/2022   History of Present Illness 61 y.o. female who presents after being found altered this morning sitting on the porch. Patient had just recently been hospitalized from 4/19-4/23 after presenting for altered mental status. MRI negative; Psychiatry consult due to recent home stressors and pt with hallucinations; +delirium. Pt intubated 5/7-5/9, re-intubated 5/11-5/14 with concern for aspiration PNA and SBO. PMH significant of HTN, reformed smoker, recently hypoxic and started on home O2, and obesity.    PT Comments    Pt initially A&Ox3 but confused as pt talking about driving, washing her hair, braiding her hair and who did all that. Spouse trying to re-orient to those situations however unable. Pt did tolerate EOB better today but then had an episode of "starring off" that resulted in pt not being able to respond to questions, state name or date but noted attempting too. S/p 1 min pt "snapped out of it" and was able to state name and date again. Pt continues to require lift equipment for safe transfer to the chair. Recommend SNF upon d/c for increased time to achieve safe level of function for safe transition home.  Recommendations for follow up therapy are one component of a multi-disciplinary discharge planning process, led by the attending physician.  Recommendations may be updated based on patient status, additional functional criteria and insurance authorization.  Follow Up Recommendations  Skilled nursing-short term rehab (<3 hours/day)     Assistance Recommended at Discharge Frequent or constant Supervision/Assistance  Patient can return home with the following Two people to help with walking and/or transfers;Two people to help with bathing/dressing/bathroom;Assistance with cooking/housework;Direct supervision/assist for medications management;Direct supervision/assist  for financial management;Assist for transportation;Help with stairs or ramp for entrance   Equipment Recommendations   (TBD at next venue)    Recommendations for Other Services       Precautions / Restrictions Precautions Precautions: Fall Precaution Comments: confused Restrictions Weight Bearing Restrictions: No     Mobility  Bed Mobility Overal bed mobility: Needs Assistance Bed Mobility: Supine to Sit, Sit to Supine     Supine to sit: Mod assist, HOB elevated Sit to supine: Max assist, +2 for physical assistance   General bed mobility comments: with max verbal cues pt able to bring LEs off EOB, modA for trunk elevation and to bring hips to EOB, pt then starring off into space at end of treatment with no verbal response requiring maxAX2 to return to supine    Transfers Overall transfer level: Needs assistance Equipment used: 2 person hand held assist (face to face transfer with knees blocked) Transfers: Sit to/from Stand Sit to Stand: Max assist, +2 physical assistance           General transfer comment: unable to clear buttocks from EOB, pt with little effort into standing today, will need hoyer lift to transfer pt to chair    Ambulation/Gait                   Stairs             Wheelchair Mobility    Modified Rankin (Stroke Patients Only)       Balance Overall balance assessment: Needs assistance Sitting-balance support: Feet supported, Bilateral upper extremity supported Sitting balance-Leahy Scale: Fair Sitting balance - Comments: Pt able to sit EOB with no assist majority of the time but would require minA to prevent posterior lean occasionally t/o 10 min of  sitting EOB   Standing balance support: During functional activity Standing balance-Leahy Scale: Zero Standing balance comment: Unable to clear buttocks from bed on 2attempts                            Cognition Arousal/Alertness: Awake/alert Behavior During  Therapy: WFL for tasks assessed/performed Overall Cognitive Status: Impaired/Different from baseline Area of Impairment: Orientation, Following commands, Safety/judgement, Problem solving, Attention                 Orientation Level: Disoriented to (pt was A&Ox3 initially then had a bout of "starring off into space" and couldn't state name and reported it to be January, then pt "snapped out of it" and was A&Ox3 again and talking fluently) Current Attention Level: Focused (easily distracted, tangentile) Memory: Decreased short-term memory Following Commands: Follows one step commands inconsistently Safety/Judgement: Decreased awareness of safety, Decreased awareness of deficits Awareness: Emergent Problem Solving: Slow processing, Decreased initiation, Difficulty sequencing, Requires verbal cues, Requires tactile cues General Comments: pt requiring max verbal cues to stay on task        Exercises Other Exercises Other Exercises: Sitting EOB: attempted LE exercises pt couldn't stay on task, very tangentile    General Comments General comments (skin integrity, edema, etc.): 154/96, BP, VSS on 2L      Pertinent Vitals/Pain Pain Assessment Pain Assessment: Faces Faces Pain Scale: Hurts a little bit Pain Location: pt with grimacing/moaning with movement of LEs Pain Descriptors / Indicators: Discomfort, Grimacing, Headache Pain Intervention(s): Monitored during session    Home Living                          Prior Function            PT Goals (current goals can now be found in the care plan section) Acute Rehab PT Goals Patient Stated Goal: i want to drive my car PT Goal Formulation: With family Time For Goal Achievement: 03/15/22 Potential to Achieve Goals: Fair Progress towards PT goals: Progressing toward goals    Frequency    Min 3X/week      PT Plan Current plan remains appropriate    Co-evaluation              AM-PAC PT "6 Clicks"  Mobility   Outcome Measure  Help needed turning from your back to your side while in a flat bed without using bedrails?: A Lot Help needed moving from lying on your back to sitting on the side of a flat bed without using bedrails?: A Lot Help needed moving to and from a bed to a chair (including a wheelchair)?: Total Help needed standing up from a chair using your arms (e.g., wheelchair or bedside chair)?: Total Help needed to walk in hospital room?: Total Help needed climbing 3-5 steps with a railing? : Total 6 Click Score: 8    End of Session Equipment Utilized During Treatment: Oxygen Activity Tolerance: Patient limited by lethargy Patient left: in bed;with call bell/phone within reach;with bed alarm set;with family/visitor present Nurse Communication: Mobility status;Need for lift equipment PT Visit Diagnosis: Other abnormalities of gait and mobility (R26.89);Muscle weakness (generalized) (M62.81)     Time: [id number] PT Time Calculation (min) (ACUTE ONLY): 25 min  Charges:  $Therapeutic Activity: 23-37 mins                    Maralyn Witherell, PT, DPT Acute Rehabilitation Services Secure  chat preferred Office #: [phone]    Berline Lopes 03/06/2022, 3:50 PM

## 2022-03-06 NOTE — Discharge Instructions (Signed)

## 2022-03-06 NOTE — Progress Notes (Signed)
Progress Note Patient: Tamara Russell MWN:[id number] DOB: [birth year] DOA: 02/06/2022  DOS: the patient was seen and examined on 03/06/2022  Brief hospital course: Patient is a 61 y.o.  female morbid obesity, HTN, GERD-who was just discharged from this facility on 4/23-presented to the hospital on 4/24 with altered mental status.  Found to have metabolic encephalopathy.  Significant events: 4/19-4/23>> hospitalization for chest pain, confusion - UTI. 4/24 Presented to the ED for confusion-admit to [facility]. 4/28 Neuro eval - marital stress possibly causing AMS - psych consult recommended 5/01 Episode of aspiration - bradycardia/worsening hypoxemia - improved after suctioning.CXR with bibasilar opacities-IV Ancef started. 5/04 Neuro re-eval per psych rec's-?autoimmune process causing AMS 5/05 Trial of Depakote per neurology. 5/06 Significant sedation / lethargy following Haldol/Ativan -concern for hypercarbia -BiPAP started. LP postponed. Haldol/Ativan dosage adjusted by psychiatry. 5/07 Intubated, eeg neg neuro consulted.  5/08 LP completed. Cortrak placed. 5/09 Extubated, continues to have agitated delirium with psychosis, and then episodes of minimal responsiveness 5/12 Intubated in setting SBO and suspected aspiration 5/15 Extubated, respiratory status improving and mental status clearing 5/19 Completed antibiotic course the day before.  Started on IVIG.  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  Multiple EEGs without seizure but with ongoing encephalopathy. LP obtained without evidence of pleocytosis; autoimmune encephalopathy panel obtained and is pending. Neurology recommended IVIG for empiric treatment of autoimmune encephalitis after infections have cleared.  Psychiatry recommending no changes to current medications; per  their assessment, patient does not have capacity to make medical decisions. Metabolic work-up including TSH and free T4, cortisol level, ammonia, ethanol level, RPR, vitamin B1, vitamin B12, blood lead level, Depakote level not revealing. Repeat EEG 5/19 shows encephalopathy without any seizures.   -Continue to follow neurology/psychiatry recommendations -Continue thiamine -Continue IVIG, last day 03/07/2022. -Continue melatonin.   Acute on chronic diastolic CHF (congestive heart failure) Elevated troponin We will treat with IV Lasix as needed. Troponin elevation mostly demand ischemia.  No work-up necessary for now.   Left distal DVT/acute Lower extremity Doppler performed due to edema and pain complaints. Appears to have left distal tibial peroneal vein DVT. Not correlating with patient's current symptoms of right leg pain. While patient remains at high risk for clot progression due to her immobility. Transition to p.o. Eliquis.   UTI (urinary tract infection) Urine culture grew E. coli, Enterococcus and Citrobacter not in a significant amount. Treated with fosfomycin earlier during admission.  Later actually completed 7 days of antibiotics for aspiration pneumonia. Per psychiatry likely contributing to her delirium.   Aspiration pneumonia Dysphagia Leukocytosis Multiple episodes of aspiration.  Klebsiella isolated on ET aspirate. Completed antibiotic course for 7 days. Patient had a cortrak for tube feeding which was removed as patient was able to pass swallowing evaluation with speech therapy. Patient appears to have mild leukocytosis for last 2 days.  Chest x-ray was negative for any acute abnormality.  So far no fever.  Monitor.   Ileus (HCC) Small bowel obstruction, has been ruled out. Patient evaluated by general surgery.  Per general surgery, more likely ileus.  Patient's symptoms appear to have resolved. Having bowel movements.   Acute on chronic respiratory failure  with hypoxia and hypercapnia  Per family, oxygen is a new requirement from previous admission.  Hypercarbia mostly in the setting of obesity/OSA and sedation from medications. Patient was intubated twice this admission secondary to aspiration issues. Now on supplemental  oxygen via nasal canula. -Wean room air   Obesity, Class III, BMI 40-49.9 (morbid obesity) (HCC) OSA (obstructive sleep apnea) Body mass index is 42.07 kg/m.  Placing the pt at higher risk of poor outcomes. Unable to tolerate BiPAP due to frequent aspiration. Patient's frequent leg pain and inability to sleep at night can represent restless leg syndrome.  Will treat with Requip and monitor.  Peptic ulcer disease GERD. Continuing PPI. Hypertension blood pressure stable. Hypokalemia, Hypernatremia Resolved. Microcytic anemia, No bleeding.  Hemoglobin stable.  Subjective: No nausea no vomiting no fever no chills.  No chest pain abdominal pain.  Continues to have difficulty sleeping at night due to leg pain and cramps.  Physical Exam: Vitals:   03/06/22 0625 03/06/22 0824 03/06/22 1250 03/06/22 1424  BP: 127/79 117/68 (!) 148/77 138/70  Pulse: 88 80 80 74  Resp: 18 17 (!) 27 (!) 25  Temp: 98.2 F (36.8 C) 98.3 F (36.8 C) 98.3 F (36.8 C) 98.2 F (36.8 C)  TempSrc: Axillary Oral Oral Oral  SpO2: 99% 98% 96% 98%  Weight:      Height:       General: Appear in mild distress; no visible Abnormal Neck Mass Or lumps, Conjunctiva normal Cardiovascular: S1 and S2 Present, no Murmur, Respiratory: good respiratory effort, Bilateral Air entry present and CTA, no Crackles, no wheezes Abdomen: Bowel Sound present, Non tender  Extremities: trace Pedal edema Neurology: alert and not oriented to time, place, and person Gait not checked due to patient safety concerns   Data Reviewed: I have Reviewed nursing notes, Vitals, and Lab results since pt's last encounter. Pertinent lab results stable CBC    Family Communication:  Husband at bedside  Disposition: Status is: Inpatient Remains inpatient appropriate because: Mentation significantly altered not at baseline.  Author: Lynden Oxford, MD 03/06/2022 7:05 PM  Please look on [email or web address] to find out who is on call.

## 2022-03-06 NOTE — TOC Benefit Eligibility Note (Signed)
Patient Product/process development scientist completed.    The patient is currently admitted and upon discharge could be taking Eliquis 5 mg.  The current 30 day co-pay is, $0.00.   The patient is insured through [employer]     Roland Earl, CPhT Pharmacy Patient Advocate Specialist [facility] Patient Advocate Team Direct Number: [phone]  Fax: [phone]

## 2022-03-06 NOTE — Progress Notes (Signed)
ANTICOAGULATION CONSULT NOTE  Pharmacy Consult for Heparin Indication: DVT  Allergies  Allergen Reactions   Penicillins Other (See Comments)    Childhood allergy. Pt does not remember reaction Tolerated cefazolin 5/1-5/6, cefepime 5/12-5/14    Patient Measurements: Height: 5\' 1"  (154.9 cm) Weight: 101 kg (222 lb 10.6 oz) IBW/kg (Calculated) : 47.8 Heparin Dosing Weight: 72 kg  Vital Signs: Temp: 98.5 F (36.9 C) (05/22 0120) Temp Source: Axillary (05/22 0120) BP: 155/75 (05/22 0120) Pulse Rate: 84 (05/22 0120)  Labs: Recent Labs    03/04/22 0423 03/05/22 0430 03/06/22 0300  HGB 10.3* 9.7* 9.9*  HCT 34.2* 32.7* 32.3*  PLT 306 308 299  HEPARINUNFRC  --   --  0.56  CREATININE 0.45 0.34*  --     Estimated Creatinine Clearance: 78.5 mL/min (A) (by C-G formula based on SCr of 0.34 mg/dL (L)).   Medical History: Past Medical History:  Diagnosis Date   Acid reflux    Complication of anesthesia    slow to wake up   History of COVID-19    Hypertension     Medications:  Medications Prior to Admission  Medication Sig Dispense Refill Last Dose   acetaminophen (TYLENOL) 500 MG tablet Take 1 tablet (500 mg total) by mouth every 6 (six) hours as needed for moderate pain. Reported on 04/21/2016 30 tablet 0 02/05/2022   aspirin EC 81 MG tablet Take 81 mg by mouth daily as needed ("overworking heart").   Past Week   Multiple Vitamins-Minerals (CENTRUM SILVER ADULT 50+ PO) Take 1 tablet by mouth daily.   Past Week   atorvastatin (LIPITOR) 40 MG tablet Take 1 tablet (40 mg total) by mouth daily. (Patient not taking: Reported on 02/06/2022) 30 tablet 0 Not Taking   [EXPIRED] cephALEXin (KEFLEX) 500 MG capsule Take 1 capsule (500 mg total) by mouth 4 (four) times daily for 4 days. (Patient not taking: Reported on 02/06/2022) 16 capsule 0 Not Taking   metoprolol succinate (TOPROL-XL) 25 MG 24 hr tablet Take 0.5 tablets (12.5 mg total) by mouth every morning for 30 doses. (Patient not  taking: Reported on 02/06/2022) 15 tablet 0 Not Taking   spironolactone (ALDACTONE) 25 MG tablet Take 1 tablet (25 mg total) by mouth daily. (Patient not taking: Reported on 02/06/2022) 30 tablet 0 Not Taking    Assessment: 61 y.o. F presented with AMS. Pt found to have LE DVT on 64. To begin heparin per pharmacy. No AC PTA. Pt has been on VTE prophylaxis with heparin then Lovenox since admission. Last Lovenox 40mg  given  today at ~1400. Hgb down to 9.7, no overt bleeding. Plt wnl.  Heparin level therapeutic 0.56 on heparin 1150 units/hr. No issues reported with heparin infusion. No signs bleeding.   Goal of Therapy:  Heparin level 0.3-0.7 units/ml Monitor platelets by anticoagulation protocol: Yes   Plan:  Continue heparin infusion at 1150 units/hr Check heparin level in 6 hours and daily while on heparin Continue to monitor H&H and platelets    Thank you for allowing pharmacy to be a part of this patient's care.  [place], PharmD Clinical Pharmacist

## 2022-03-06 NOTE — Plan of Care (Signed)
  Problem: Health Behavior/Discharge Planning: Goal: Ability to manage health-related needs will improve Outcome: Progressing   Problem: Clinical Measurements: Goal: Ability to maintain clinical measurements within normal limits will improve Outcome: Progressing Goal: Will remain free from infection Outcome: Progressing Goal: Diagnostic test results will improve Outcome: Progressing Goal: Respiratory complications will improve Outcome: Progressing   Problem: Activity: Goal: Risk for activity intolerance will decrease Outcome: Progressing   Problem: Nutrition: Goal: Adequate nutrition will be maintained Outcome: Progressing   Problem: Coping: Goal: Level of anxiety will decrease Outcome: Progressing   Problem: Elimination: Goal: Will not experience complications related to bowel motility Outcome: Progressing Goal: Will not experience complications related to urinary retention Outcome: Progressing   Problem: Pain Managment: Goal: General experience of comfort will improve Outcome: Progressing   Problem: Safety: Goal: Ability to remain free from injury will improve Outcome: Progressing   Problem: Skin Integrity: Goal: Risk for impaired skin integrity will decrease Outcome: Progressing   Problem: Education: Goal: Knowledge of General Education information will improve Description: Including pain rating scale, medication(s)/side effects and non-pharmacologic comfort measures Outcome: Progressing   Problem: Health Behavior/Discharge Planning: Goal: Ability to manage health-related needs will improve Outcome: Progressing   Problem: Clinical Measurements: Goal: Ability to maintain clinical measurements within normal limits will improve Outcome: Progressing Goal: Will remain free from infection Outcome: Progressing Goal: Diagnostic test results will improve Outcome: Progressing Goal: Respiratory complications will improve Outcome: Progressing Goal: Cardiovascular  complication will be avoided Outcome: Progressing   Problem: Activity: Goal: Risk for activity intolerance will decrease Outcome: Progressing   Problem: Nutrition: Goal: Adequate nutrition will be maintained Outcome: Progressing   Problem: Coping: Goal: Level of anxiety will decrease Outcome: Progressing   Problem: Elimination: Goal: Will not experience complications related to bowel motility Outcome: Progressing Goal: Will not experience complications related to urinary retention Outcome: Progressing   Problem: Pain Managment: Goal: General experience of comfort will improve Outcome: Progressing   Problem: Safety: Goal: Ability to remain free from injury will improve Outcome: Progressing   Problem: Skin Integrity: Goal: Risk for impaired skin integrity will decrease Outcome: Progressing   Problem: Urinary Elimination: Goal: Signs and symptoms of infection will decrease Outcome: Progressing

## 2022-03-07 ENCOUNTER — Inpatient Hospital Stay (HOSPITAL_COMMUNITY): Payer: 59

## 2022-03-07 DIAGNOSIS — G0481 Other encephalitis and encephalomyelitis: Secondary | ICD-10-CM | POA: Diagnosis not present

## 2022-03-07 DIAGNOSIS — G9341 Metabolic encephalopathy: Secondary | ICD-10-CM | POA: Diagnosis not present

## 2022-03-07 LAB — GLUCOSE, CAPILLARY
Glucose-Capillary: 129 mg/dL — ABNORMAL HIGH (ref 70–99)
Glucose-Capillary: 145 mg/dL — ABNORMAL HIGH (ref 70–99)
Glucose-Capillary: 90 mg/dL (ref 70–99)
Glucose-Capillary: 92 mg/dL (ref 70–99)
Glucose-Capillary: 96 mg/dL (ref 70–99)
Glucose-Capillary: 97 mg/dL (ref 70–99)

## 2022-03-07 MED ORDER — MELATONIN 3 MG PO TABS
3.0000 mg | ORAL_TABLET | Freq: Every day | ORAL | Status: DC
  Administered 2022-03-07 – 2022-03-08 (×2): 3 mg via ORAL
  Filled 2022-03-07 (×2): qty 1

## 2022-03-07 MED ORDER — ACETAMINOPHEN 500 MG PO TABS
500.0000 mg | ORAL_TABLET | Freq: Every day | ORAL | Status: AC
  Administered 2022-03-07 – 2022-03-09 (×3): 500 mg via ORAL
  Filled 2022-03-07 (×3): qty 1

## 2022-03-07 MED ORDER — ROPINIROLE HCL 0.25 MG PO TABS
0.2500 mg | ORAL_TABLET | ORAL | Status: DC
  Filled 2022-03-07: qty 1

## 2022-03-07 NOTE — TOC Progression Note (Signed)
Transition of Care [facility]) - Progression Note    Patient Details  Name: Abeera Flannery MRN: [id number] Date of Birth: 1961-04-04  Transition of Care [facility]) CM/SW [place], [place] Phone Number: 03/07/2022, 11:53 AM  Clinical Narrative:   CSW met with patient's spouse to provide SNF bed offers. Spouse very much interested in CIR as opposed to SNF, and CSW explained that CIR admissions have reviewed and do not feel that patient is able to tolerate at this time, but that CSW will continue to monitor progress and advocate for patient on his behalf if she improves. Patient's spouse said he is doing everything he can to try to get her moving more and he wants her to be able to walk to the door and back, asking if therapy will come and see her soon to get her up so he can work her legs more. CSW sent a message to OT assigned that spouse is requesting the patient to be up in the chair. CSW to continue to follow.    Expected Discharge Plan: Skilled Nursing Facility Barriers to Discharge: Continued Medical Work up, Ship broker  Expected Discharge Plan and Services Expected Discharge Plan: [place]   Discharge Planning Services: CM Consult                                           Social Determinants of Health (SDOH) Interventions    Readmission Risk Interventions     View : No data to display.

## 2022-03-07 NOTE — Progress Notes (Signed)
Occupational Therapy Treatment Patient Details Name: Tamara Russell MRN: [id number] DOB: Mar 16, 1961 Today's Date: 03/07/2022   History of present illness 61 y.o. female who presents after being found altered this morning sitting on the porch. Patient had just recently been hospitalized from 4/19-4/23 after presenting for altered mental status. MRI negative; Psychiatry consult due to recent home stressors and pt with hallucinations; +delirium. Pt intubated 5/7-5/9, re-intubated 5/11-5/14 with concern for aspiration PNA and SBO. PMH significant of HTN, reformed smoker, recently hypoxic and started on home O2, and obesity.   OT comments  Patient received in supine with husband present. Patient lethargic initially but more alert with cueing. Patient was transferred to recliner with maximove lift with patient assisting with rolling. Once in recliner patient was instructed in hand and face hygiene, reaching task, trunk flexion, and AAROM exercises. Patient became more lethargic at end of session. Patient to continue to be followed by acute OT.    Recommendations for follow up therapy are one component of a multi-disciplinary discharge planning process, led by the attending physician.  Recommendations may be updated based on patient status, additional functional criteria and insurance authorization.    Follow Up Recommendations  Skilled nursing-short term rehab (<3 hours/day)    Assistance Recommended at Discharge Frequent or constant Supervision/Assistance  Patient can return home with the following  Two people to help with walking and/or transfers;Two people to help with bathing/dressing/bathroom;Assistance with cooking/housework;Assistance with feeding;Help with stairs or ramp for entrance;Assist for transportation;Direct supervision/assist for financial management;Direct supervision/assist for medications management   Equipment Recommendations  Other (comment) (TBD)    Recommendations for  Other Services      Precautions / Restrictions Precautions Precautions: Fall Precaution Comments: confused Restrictions Weight Bearing Restrictions: No       Mobility Bed Mobility Overal bed mobility: Needs Assistance Bed Mobility: Rolling Rolling: Max assist         General bed mobility comments: rolling in bed for lift pad placement    Transfers Overall transfer level: Needs assistance Equipment used: Ambulation equipment used Transfers: Bed to chair/wheelchair/BSC             General transfer comment: maximove used for transfer to recliner due to lethargic and safe transfer option Transfer via Lift Equipment: Maximove   Balance Overall balance assessment: Needs assistance Sitting-balance support: Feet supported, Bilateral upper extremity supported Sitting balance-Leahy Scale: Fair Sitting balance - Comments: trunk flexion exercises performed in recliner with min assist                                   ADL either performed or assessed with clinical judgement   ADL Overall ADL's : Needs assistance/impaired     Grooming: Wash/dry hands;Wash/dry face;Minimal assistance;Sitting Grooming Details (indicate cue type and reason): in recliner             Lower Body Dressing: Total assistance;Bed level Lower Body Dressing Details (indicate cue type and reason): to donn socks               General ADL Comments: difficulty following commands    Extremity/Trunk Assessment Upper Extremity Assessment RUE Deficits / Details: very weak grip, unable to reach mouth, or lift arm off bed RUE Sensation: WNL RUE Coordination: decreased fine motor;decreased gross motor LUE Deficits / Details: very weak grip, unable to reach mouth, or lift arm off bed LUE Sensation: WNL LUE Coordination: decreased fine motor;decreased gross motor  Vision       Perception     Praxis      Cognition Arousal/Alertness: Lethargic,  Awake/alert Behavior During Therapy: WFL for tasks assessed/performed Overall Cognitive Status: Impaired/Different from baseline Area of Impairment: Orientation, Following commands, Safety/judgement, Problem solving, Attention                 Orientation Level: Disoriented to, Place, Time, Situation Current Attention Level: Focused (easily distracted) Memory: Decreased short-term memory Following Commands: Follows one step commands inconsistently Safety/Judgement: Decreased awareness of safety, Decreased awareness of deficits Awareness: Emergent Problem Solving: Slow processing, Decreased initiation, Difficulty sequencing, Requires verbal cues, Requires tactile cues General Comments: lethargic initially but became more alert till end of session and became more lethargic. Easily distracted and often staring off with frequent cues to recenter.        Exercises Exercises: General Upper Extremity General Exercises - Upper Extremity Shoulder Flexion: AAROM, Both, 10 reps Shoulder ABduction: AAROM, Both, 10 reps, Seated Other Exercises Other Exercises: trunk flexion exercises while seated in recliner Other Exercises: reaching exercises in recliner    Shoulder Instructions       General Comments      Pertinent Vitals/ Pain       Pain Assessment Pain Assessment: Faces Faces Pain Scale: Hurts little more Pain Location: RLE and buttocks Pain Descriptors / Indicators: Discomfort, Grimacing Pain Intervention(s): Limited activity within patient's tolerance, Monitored during session, Repositioned  Home Living                                          Prior Functioning/Environment              Frequency  Min 2X/week        Progress Toward Goals  OT Goals(current goals can now be found in the care plan section)  Progress towards OT goals: Progressing toward goals  Acute Rehab OT Goals Patient Stated Goal: get stronger OT Goal Formulation: With  patient/family Time For Goal Achievement: 03/13/22 Potential to Achieve Goals: Good ADL Goals Pt Will Perform Grooming: with min guard assist;sitting Pt Will Perform Upper Body Dressing: with min assist;sitting Pt Will Perform Lower Body Dressing: with mod assist;sit to/from stand Pt Will Transfer to Toilet: with mod assist;stand pivot transfer;bedside commode Pt Will Perform Toileting - Clothing Manipulation and hygiene: with supervision;sitting/lateral leans;sit to/from stand Additional ADL Goal #1: Tolerate sitting edge of bed unsupported up to 10 min to increase ADL independence Additional ADL Goal #2: Pt will demonstrate improved cognition by consistently following 2 step instructions, and initiating ADLs when needed without cues.  Plan Discharge plan remains appropriate    Co-evaluation                 AM-PAC OT "6 Clicks" Daily Activity     Outcome Measure   Help from another person eating meals?: A Lot Help from another person taking care of personal grooming?: A Little Help from another person toileting, which includes using toliet, bedpan, or urinal?: Total Help from another person bathing (including washing, rinsing, drying)?: A Lot Help from another person to put on and taking off regular upper body clothing?: A Lot Help from another person to put on and taking off regular lower body clothing?: Total 6 Click Score: 11    End of Session Equipment Utilized During Treatment: Oxygen;Other (comment) (Maximove)  OT Visit Diagnosis: Unsteadiness on feet (R26.81);Other symptoms  and signs involving cognitive function;Other abnormalities of gait and mobility (R26.89);Muscle weakness (generalized) (M62.81);Pain Pain - Right/Left: Right Pain - part of body: Knee   Activity Tolerance Patient limited by lethargy   Patient Left in chair;with call bell/phone within reach;with family/visitor present   Nurse Communication Mobility status;Need for lift equipment         Time: 1204-1237 OT Time Calculation (min): 33 min  Charges: OT General Charges $OT Visit: 1 Visit OT Treatments $Therapeutic Activity: 8-22 mins $Therapeutic Exercise: 8-22 mins  Lodema Hong, OTA Acute Rehabilitation Services  Pager [phone] Office [place] 03/07/2022, 1:59 PM

## 2022-03-07 NOTE — Progress Notes (Signed)
Progress Note Patient: Tamara Russell [id number] DOB: [birth year] DOA: 02/06/2022  DOS: the patient was seen and examined on 03/07/2022  Brief hospital course: Patient is a 61 y.o.  female morbid obesity, HTN, GERD-who was just discharged from this facility on 4/23-presented to the hospital on 4/24 with altered mental status.  Found to have metabolic encephalopathy.  Significant events: 4/19-4/23>> hospitalization for chest pain, confusion - UTI. 4/24 Presented to the ED for confusion-admit to [facility]. 4/28 Neuro eval - marital stress possibly causing AMS - psych consult recommended 5/01 Episode of aspiration - bradycardia/worsening hypoxemia - improved after suctioning.CXR with bibasilar opacities-IV Ancef started. 5/04 Neuro re-eval per psych rec's-?autoimmune process causing AMS 5/05 Trial of Depakote per neurology. 5/06 Significant sedation / lethargy following Haldol/Ativan -concern for hypercarbia -BiPAP started. LP postponed. Haldol/Ativan dosage adjusted by psychiatry. 5/07 Intubated, eeg neg neuro consulted.  5/08 LP completed. Cortrak placed. 5/09 Extubated, continues to have agitated delirium with psychosis, and then episodes of minimal responsiveness 5/12 Intubated in setting SBO and suspected aspiration 5/15 Extubated, respiratory status improving and mental status clearing 5/19 Completed antibiotic course the day before.  Started on IVIG. 5/23, 5 days IVIG completed, no change in mentation.  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  Multiple EEGs without seizure but with ongoing encephalopathy. LP obtained without evidence of pleocytosis; autoimmune encephalopathy panel obtained and is pending. Neurology recommended IVIG for empiric treatment of autoimmune encephalitis after infections have cleared.  Psychiatry  recommending no changes to current medications; per their assessment, patient does not have capacity to make medical decisions. Metabolic work-up including TSH and free T4, cortisol level, ammonia, ethanol level, RPR, vitamin B1, vitamin B12, blood lead level, Depakote level not revealing. Repeat EEG 5/19 shows encephalopathy without any seizures.   -Continue to follow neurology recommendations -Continue thiamine -Completed IVIG, last day 03/07/2022. -Continue melatonin. -Reconsult psychiatry for further assistance.   Acute on chronic diastolic CHF (congestive heart failure) Elevated troponin We will treat with IV Lasix as needed. Troponin elevation mostly demand ischemia.  No work-up necessary for now.   Left distal DVT/acute Lower extremity Doppler performed due to edema and pain complaints. Appears to have left distal tibial peroneal vein DVT. Not correlating with patient's current symptoms of right leg pain. While patient remains at high risk for clot progression due to her immobility. Transition to p.o. Eliquis.   UTI (urinary tract infection) Urine culture grew E. coli, Enterococcus and Citrobacter not in a significant amount. Treated with fosfomycin earlier during admission.  Later actually completed 7 days of antibiotics for aspiration pneumonia. Per psychiatry likely contributing to her delirium.   Aspiration pneumonia Dysphagia Leukocytosis Multiple episodes of aspiration.  Klebsiella isolated on ET aspirate. Completed antibiotic course for 7 days. Patient had a cortrak for tube feeding which was removed as patient was able to pass swallowing evaluation with speech therapy. Patient appears to have mild leukocytosis for last 2 days.  Chest x-ray was negative for any acute abnormality.  So far no fever.  Monitor.   Ileus (HCC) Small bowel obstruction, has been ruled out. Patient evaluated by general surgery.  Per general surgery, more likely ileus.  Patient's symptoms  appear to have resolved. Having bowel movements.   Acute on chronic respiratory failure with hypoxia and hypercapnia  Per family, oxygen is a new requirement from previous admission.  Hypercarbia mostly in the setting of obesity/OSA and sedation from  medications. Patient was intubated twice this admission secondary to aspiration issues. Now on supplemental oxygen via nasal canula. -Wean room air   Obesity, Class III, BMI 40-49.9 (morbid obesity) (HCC) OSA (obstructive sleep apnea) Body mass index is 42.07 kg/m.  Placing the pt at higher risk of poor outcomes. Unable to tolerate BiPAP due to frequent aspiration. Patient's frequent leg pain and inability to sleep at night can represent restless leg syndrome.  Will treat with Requip and monitor.  Bilateral leg pain. Right knee pain appears to be secondary to arthritis.  Left leg pain is possibly due to DVT. Treated with Tylenol.  Peptic ulcer disease GERD. Continuing PPI. Hypertension blood pressure stable. Hypokalemia, Hypernatremia Resolved. Microcytic anemia, No bleeding.  Hemoglobin stable.  Subjective: No nausea no vomiting.  No fever no chills.  No other acute events overnight, Continues to have right knee pain.  Making it difficult for her to sleep at night.  Physical Exam: Vitals:   03/07/22 0720 03/07/22 1115 03/07/22 1513 03/07/22 1915  BP: (!) 144/72 135/71 (!) 145/85 130/74  Pulse: 93 95 84 85  Resp: 20 20 (!) 22 (!) 26  Temp: 98.5 F (36.9 C) 97.6 F (36.4 C) 98.4 F (36.9 C)   TempSrc: Oral Oral Oral   SpO2: 97% 100% 98% 97%  Weight:      Height:       General: Appear in mild distress; no visible Abnormal Neck Mass Or lumps, Conjunctiva normal Cardiovascular: S1 and S2 Present, no Murmur, Respiratory: good respiratory effort, Bilateral Air entry present and faint crackles, no wheezes Abdomen: Bowel Sound present, Non tender  Extremities: trace Pedal edema Neurology: alert and oriented to self Gait not  checked due to patient safety concerns  Data Reviewed: CBC remained stable. Ordered x-ray knee which shows arthritis.  Family Communication: Husband at bedside  Disposition: Status is: Inpatient Remains inpatient appropriate because: Mentation significantly altered not at baseline.  Awaiting psychiatry recommendation and neuro recommendation.  Author: Lynden Oxford, MD 03/07/2022 7:37 PM  Please look on [email or web address] to find out who is on call.

## 2022-03-08 ENCOUNTER — Inpatient Hospital Stay (HOSPITAL_COMMUNITY): Payer: 59

## 2022-03-08 DIAGNOSIS — G9340 Encephalopathy, unspecified: Secondary | ICD-10-CM

## 2022-03-08 DIAGNOSIS — E662 Morbid (severe) obesity with alveolar hypoventilation: Secondary | ICD-10-CM | POA: Diagnosis not present

## 2022-03-08 DIAGNOSIS — J9622 Acute and chronic respiratory failure with hypercapnia: Secondary | ICD-10-CM

## 2022-03-08 DIAGNOSIS — J9621 Acute and chronic respiratory failure with hypoxia: Secondary | ICD-10-CM

## 2022-03-08 DIAGNOSIS — G9341 Metabolic encephalopathy: Secondary | ICD-10-CM | POA: Diagnosis not present

## 2022-03-08 DIAGNOSIS — I5033 Acute on chronic diastolic (congestive) heart failure: Secondary | ICD-10-CM

## 2022-03-08 LAB — BLOOD GAS, ARTERIAL
Acid-Base Excess: 15 mmol/L — ABNORMAL HIGH (ref 0.0–2.0)
Bicarbonate: 42.8 mmol/L — ABNORMAL HIGH (ref 20.0–28.0)
Drawn by: 336832
O2 Saturation: 95.9 %
Patient temperature: 37
pCO2 arterial: 66 mmHg (ref 32–48)
pH, Arterial: 7.42 (ref 7.35–7.45)
pO2, Arterial: 73 mmHg — ABNORMAL LOW (ref 83–108)

## 2022-03-08 LAB — COMPREHENSIVE METABOLIC PANEL
ALT: 12 U/L (ref 0–44)
AST: 21 U/L (ref 15–41)
Albumin: 1.9 g/dL — ABNORMAL LOW (ref 3.5–5.0)
Alkaline Phosphatase: 46 U/L (ref 38–126)
Anion gap: 3 — ABNORMAL LOW (ref 5–15)
BUN: <5 mg/dL — ABNORMAL LOW (ref 8–23)
CO2: 39 mmol/L — ABNORMAL HIGH (ref 22–32)
Calcium: 8.7 mg/dL — ABNORMAL LOW (ref 8.9–10.3)
Chloride: 92 mmol/L — ABNORMAL LOW (ref 98–111)
Creatinine, Ser: 0.36 mg/dL — ABNORMAL LOW (ref 0.44–1.00)
GFR, Estimated: >60 mL/min (ref >60)
Glucose, Bld: 88 mg/dL (ref 70–99)
Potassium: 3.9 mmol/L (ref 3.5–5.1)
Sodium: 134 mmol/L — ABNORMAL LOW (ref 135–145)
Total Bilirubin: 0.2 mg/dL — ABNORMAL LOW (ref 0.3–1.2)
Total Protein: 7.5 g/dL (ref 6.5–8.1)

## 2022-03-08 LAB — GLUCOSE, CAPILLARY
Glucose-Capillary: 89 mg/dL (ref 70–99)
Glucose-Capillary: 140 mg/dL — ABNORMAL HIGH (ref 70–99)
Glucose-Capillary: 107 mg/dL — ABNORMAL HIGH (ref 70–99)
Glucose-Capillary: 141 mg/dL — ABNORMAL HIGH (ref 70–99)

## 2022-03-08 LAB — CBC
HCT: 32.2 % — ABNORMAL LOW (ref 36.0–46.0)
Hemoglobin: 9.6 g/dL — ABNORMAL LOW (ref 12.0–15.0)
MCH: 23.9 pg — ABNORMAL LOW (ref 26.0–34.0)
MCHC: 29.8 g/dL — ABNORMAL LOW (ref 30.0–36.0)
MCV: 80.1 fL (ref 80.0–100.0)
Platelets: 263 10*3/uL (ref 150–400)
RBC: 4.02 MIL/uL (ref 3.87–5.11)
RDW: 18.6 % — ABNORMAL HIGH (ref 11.5–15.5)
WBC: 5.4 10*3/uL (ref 4.0–10.5)
nRBC: 0 % (ref 0.0–0.2)

## 2022-03-08 MED ORDER — HYDROCORTISONE (PERIANAL) 2.5 % EX CREA
1.0000 "application " | TOPICAL_CREAM | Freq: Four times a day (QID) | CUTANEOUS | Status: DC | PRN
  Administered 2022-03-11 – 2022-03-17 (×3): 1 via TOPICAL
  Filled 2022-03-08: qty 28.35

## 2022-03-08 MED ORDER — ORAL CARE MOUTH RINSE
15.0000 mL | Freq: Two times a day (BID) | OROMUCOSAL | Status: DC
  Administered 2022-03-08 – 2022-03-17 (×13): 15 mL via OROMUCOSAL

## 2022-03-08 NOTE — Progress Notes (Signed)
Lab called critical PCO2 66, MD notified

## 2022-03-08 NOTE — Progress Notes (Signed)
Pt. Not placed on cpap due to confusion. RN made aware.

## 2022-03-08 NOTE — Progress Notes (Signed)
Physical Therapy Treatment Patient Details Name: Tamara Russell MRN: [id number] DOB: 1961/04/28 Today's Date: 03/08/2022   History of Present Illness 61 y.o. female who presents after being found altered this morning sitting on the porch. Patient had just recently been hospitalized from 4/19-4/23 after presenting for altered mental status. MRI negative; Psychiatry consult due to recent home stressors and pt with hallucinations; +delirium. Pt intubated 5/7-5/9, re-intubated 5/11-5/14 with concern for aspiration PNA and SBO. PMH significant of HTN, reformed smoker, recently hypoxic and started on home O2, and obesity.    PT Comments    Pt received in recliner, confused/appearing delirious (see cognition below) and with fair participation and tolerance for seated balance tasks, lateral scoot transfer training and bed mobility. Pt with increased c/o pain in R ankle during transfer, RN/MD notified. Pt needing totalA +2 for seated lateral scooting from chair>EOB due to poor command following and +2 maxA for bed mobility. VSS on 3L O2 [place]. Pt pulling and less on IS despite good effort, encouraged frequent use, pt will need reinforcement due to cognitive deficit and poor carryover of instruction on improved technique. Pt continues to benefit from PT services to progress toward functional mobility goals.   Recommendations for follow up therapy are one component of a multi-disciplinary discharge planning process, led by the attending physician.  Recommendations may be updated based on patient status, additional functional criteria and insurance authorization.  Follow Up Recommendations  Skilled nursing-short term rehab (<3 hours/day)     Assistance Recommended at Discharge Frequent or constant Supervision/Assistance  Patient can return home with the following Two people to help with walking and/or transfers;Two people to help with bathing/dressing/bathroom;Assistance with cooking/housework;Direct  supervision/assist for medications management;Direct supervision/assist for financial management;Assist for transportation;Help with stairs or ramp for entrance   Equipment Recommendations  None recommended by PT    Recommendations for Other Services       Precautions / Restrictions Precautions Precautions: Fall Precaution Comments: delirium Restrictions Weight Bearing Restrictions: No     Mobility  Bed Mobility Overal bed mobility: Needs Assistance Bed Mobility: Rolling Rolling: Max assist, +2 for physical assistance     Sit to supine: Max assist, +2 for physical assistance   General bed mobility comments: rolling in bed to L/R for lift pad removal and peri-care; x3 reps. Pt with mod cues and min guard for supine to long sit in bed from HOB ~20* for placement of additional pillow behind her. MaxA +2 for EOB>supine transfer via log roll after scoot transfer back to bed due to increased lethargy.    Transfers Overall transfer level: Needs assistance Equipment used: Sliding board Transfers: Bed to chair/wheelchair/BSC Sit to Stand:  (pt unable to follow instructions to attempt)          Lateral/Scoot Transfers: +2 physical assistance, Total assist, With slide board General transfer comment: RN at pt R hip and PTA at her L side, max cues and increased time to set up for scoot transfer from drop arm recliner, pt with increased anxiety when encouraged to attempt and c/o R ankle pain, therefore advanced her RLE to prevent WB through this limb and pt still needed totalA +2 to scoot onto slide board and then onto  bed surface using lift/transfer pad assist. Pt not following instructions to push with BUE or LLE.       Balance Overall balance assessment: Needs assistance Sitting-balance support: Feet supported, Bilateral upper extremity supported Sitting balance-Leahy Scale: Fair Sitting balance - Comments: long sitting in bed  with B rails and min guard (<1 minute), sitting EOB  with 1-2 UE support and consistent minA due to pt lethargy       Standing balance comment: pt unable to follow instructions today for pushing up from chair with BUE to attempt.          Cognition Arousal/Alertness: Lethargic Behavior During Therapy: WFL for tasks assessed/performed Overall Cognitive Status: Impaired/Different from baseline Area of Impairment: Orientation, Following commands, Safety/judgement, Problem solving, Attention, Memory, Awareness                 Orientation Level: Disoriented to, Place, Time, Situation Current Attention Level: Focused (easily distracted) Memory: Decreased short-term memory Following Commands: Follows one step commands inconsistently, Follows one step commands with increased time Safety/Judgement: Decreased awareness of safety, Decreased awareness of deficits Awareness: Intellectual Problem Solving: Slow processing, Decreased initiation, Difficulty sequencing, Requires verbal cues, Requires tactile cues General Comments: Pt somewhat alert initially but became more lethargic after scooting back from chair>bed. Easily distracted and seeming to see things in room that were not there. Pt stating "wait" after >10 mins to explain to pt process for seated scooting and attempting to improve her seated body mechanics to attempt, pt reports fear of only +2 assist to help her to scoot then states "I need 10 people" to scoot back to bed. Pt with minimal effort/command follow for initiation of UE/LE movement while scooting. Per RN, she followed simple mobility cues better earlier in the day when her son was in the room, no family present during session.        Exercises Other Exercises Other Exercises: pulling trunk forward with BUE support of armrests x2 reps in chair and pulling on B side rails for long sit in bed x1 Other Exercises: BLE A/AAROM: ankle pumps (R side limited due to increased pain), knee extension x5-10 reps ea Other Exercises: IS x 10  reps (200-250 mL max)        Pertinent Vitals/Pain Pain Assessment Pain Assessment: Faces Faces Pain Scale: Hurts whole lot Pain Location: R ankle very tender to palpation; some discomfort during peri care and noted areas of excoriation as if from scratches in this area, RN notified Pain Descriptors / Indicators: Discomfort, Grimacing, Moaning, Tender Pain Intervention(s): Limited activity within patient's tolerance, Monitored during session, Repositioned (MD also notified of pt increased R ankle tenderness)     PT Goals (current goals can now be found in the care plan section) Acute Rehab PT Goals Patient Stated Goal: To get in and out of bed more easily. PT Goal Formulation: With family Time For Goal Achievement: 03/15/22 Progress towards PT goals: Progressing toward goals    Frequency    Min 3X/week      PT Plan Current plan remains appropriate       AM-PAC PT "6 Clicks" Mobility   Outcome Measure  Help needed turning from your back to your side while in a flat bed without using bedrails?: A Lot Help needed moving from lying on your back to sitting on the side of a flat bed without using bedrails?: A Lot Help needed moving to and from a bed to a chair (including a wheelchair)?: Total Help needed standing up from a chair using your arms (e.g., wheelchair or bedside chair)?: Total Help needed to walk in hospital room?: Total Help needed climbing 3-5 steps with a railing? : Total 6 Click Score: 8    End of Session Equipment Utilized During Treatment: Oxygen Activity Tolerance: Patient limited  by lethargy;Patient limited by pain Patient left: in bed;with call bell/phone within reach;with nursing/sitter in room (in care of RN/NT for bathing in supine) Nurse Communication: Mobility status;Need for lift equipment;Other (comment) (maxi-move recommended until R ankle assessed further due to possibly increased pain today (unsure baseline RLE pain) and pt poor command following  to stand/scoot) PT Visit Diagnosis: Other abnormalities of gait and mobility (R26.89);Muscle weakness (generalized) (M62.81)     Time: 2423-5361 PT Time Calculation (min) (ACUTE ONLY): 36 min  Charges:  $Therapeutic Exercise: 8-22 mins $Therapeutic Activity: 8-22 mins                     Darnette Lampron P., PTA Acute Rehabilitation Services Secure Chat Preferred 9a-5:30pm Office: [phone]    Dorathy Kinsman [facility] 03/08/2022, 3:22 PM

## 2022-03-08 NOTE — Progress Notes (Signed)
Neurology Progress Note   S:// Patient reported by primary RN to hallucinations and is not sleeping. She continues to have delirium and has difficulty being reoriented by staff.    O:// Current vital signs: BP 138/83 (BP Location: Left Arm)   Pulse 66   Temp 98.6 F (37 C) (Oral)   Resp (!) 23   Ht 5\' 1"  (1.549 m)   Wt 101 kg   SpO2 99%   BMI 42.07 kg/m  Vital signs in last 24 hours: Temp:  [97.9 F (36.6 C)-98.8 F (37.1 C)] 98.6 F (37 C) (05/24 1522) Pulse Rate:  [66-86] 66 (05/24 1522) Resp:  [20-28] 23 (05/24 1522) BP: (130-156)/(74-89) 138/83 (05/24 1522) SpO2:  [95 %-100 %] 99 % (05/24 1522)  NEURO:  Mental Status: AA&O x2 (Person/Place) not year. She will answer simple questions .She follows commands. She did recall her correct age and DOB and also introduced her daughter at the bedside.. Language: speech is Normal, Naming, repetition is intact.   Cranial Nerves: PERRL 3 mm/brisk. EOMI, visual fields full, no facial asymmetry, facial sensation intact, hearing intact, tongue/uvula midline. Motor: No drift upper extremities            RUE 5-/5 RLE 3/5 Poor Effort            LUE 5-/5  LLE  3/5 Poor Effort  Tone and bulk are normal Sensation- Intact to light touch bilaterally x 4 Gait- Deferred due to patient's condition    Medications  Current Facility-Administered Medications:    0.9 %  sodium chloride infusion, 250 mL, Intravenous, Continuous, 02-20-1975, NP, Paused at 02/27/22 1532   acetaminophen (TYLENOL) tablet 650 mg, 650 mg, Oral, Q6H PRN, 650 mg at 03/07/22 1608 **OR** acetaminophen (TYLENOL) suppository 650 mg, 650 mg, Rectal, Q6H PRN, 03/09/22, MD   acetaminophen (TYLENOL) tablet 500 mg, 500 mg, Oral, QHS, Rolly Salter, MD, 500 mg at 03/07/22 2305   albuterol (PROVENTIL) (2.5 MG/3ML) 0.083% nebulizer solution 2.5 mg, 2.5 mg, Nebulization, Q6H PRN, 03/09/22, Rondell A, MD, 2.5 mg at 02/24/22 0801   apixaban (ELIQUIS) tablet 10 mg, 10 mg,  Oral, BID, 10 mg at 03/08/22 1017 **FOLLOWED BY** [START ON 03/13/2022] apixaban (ELIQUIS) tablet 5 mg, 5 mg, Oral, BID, 03/15/2022, MD   aspirin EC tablet 81 mg, 81 mg, Oral, Daily, Rolly Salter, MD, 81 mg at 03/08/22 1017   atorvastatin (LIPITOR) tablet 40 mg, 40 mg, Oral, QHS, 03/10/22, MD, 40 mg at 03/07/22 2304   chlorhexidine (PERIDEX) 0.12 % solution 15 mL, 15 mL, Mouth Rinse, BID, 2305, MD, 15 mL at 03/08/22 1020   Chlorhexidine Gluconate Cloth 2 % PADS 6 each, 6 each, Topical, Daily, 03/10/22, MD, 6 each at 03/08/22 1020   feeding supplement (ENSURE ENLIVE / ENSURE PLUS) liquid 237 mL, 237 mL, Oral, BID BM, 03/10/22, MD, 237 mL at 03/08/22 1021   hydrocortisone (ANUSOL-HC) suppository 25 mg, 25 mg, Rectal, BID PRN, Ghimire, 03/10/22, MD   MEDLINE mouth rinse, 15 mL, Mouth Rinse, BID, Werner Lean, MD   melatonin tablet 3 mg, 3 mg, Oral, QHS, Zannie Cove, MD, 3 mg at 03/07/22 2304   multivitamin with minerals tablet 1 tablet, 1 tablet, Oral, Daily, 2305, MD, 1 tablet at 03/08/22 1017   Muscle Rub CREA, , Topical, PRN, 03/10/22, MD, Given at 03/07/22 1617   sodium chloride flush (NS) 0.9 % injection  10-40 mL, 10-40 mL, Intracatheter, Q12H, Lorin Glass, MD, 10 mL at 03/08/22 1021   sodium chloride flush (NS) 0.9 % injection 10-40 mL, 10-40 mL, Intracatheter, PRN, Lorin Glass, MD, 10 mL at 03/04/22 1537   sodium chloride flush (NS) 0.9 % injection 3 mL, 3 mL, Intravenous, Q12H, Smith, Rondell A, MD, 3 mL at 03/08/22 1021   thiamine tablet 100 mg, 100 mg, Oral, Daily, Rolly Salter, MD, 100 mg at 03/08/22 1017  Labs CBC    Component Value Date/Time   WBC 5.4 03/08/2022 0515   RBC 4.02 03/08/2022 0515   HGB 9.6 (L) 03/08/2022 0515   HCT 32.2 (L) 03/08/2022 0515   PLT 263 03/08/2022 0515   MCV 80.1 03/08/2022 0515   MCH 23.9 (L) 03/08/2022 0515   MCHC 29.8 (L) 03/08/2022 0515   RDW 18.6 (H) 03/08/2022 0515    LYMPHSABS 1.2 02/13/2022 0930   MONOABS 0.7 02/13/2022 0930   EOSABS 0.0 02/13/2022 0930   BASOSABS 0.1 02/13/2022 0930    CMP     Component Value Date/Time   NA 134 (L) 03/08/2022 0515   NA 140 03/05/2017 1449   K 3.9 03/08/2022 0515   CL 92 (L) 03/08/2022 0515   CO2 39 (H) 03/08/2022 0515   GLUCOSE 88 03/08/2022 0515   BUN <5 (L) 03/08/2022 0515   BUN 7 03/05/2017 1449   CREATININE 0.36 (L) 03/08/2022 0515   CREATININE 0.57 04/21/2016 1528   CALCIUM 8.7 (L) 03/08/2022 0515   PROT 7.5 03/08/2022 0515   ALBUMIN 1.9 (L) 03/08/2022 0515   AST 21 03/08/2022 0515   ALT 12 03/08/2022 0515   ALKPHOS 46 03/08/2022 0515   BILITOT 0.2 (L) 03/08/2022 0515   GFRNONAA >60 03/08/2022 0515   GFRNONAA >89 04/01/2015 1608   GFRAA >60 12/03/2019 2032   GFRAA >89 04/01/2015 1608    glycosylated hemoglobin / Lipid Panel     Component Value Date/Time   CHOL 163 02/02/2022 0424   TRIG 84 02/25/2022 0341   HDL 78 02/02/2022 0424   CHOLHDL 2.1 02/02/2022 0424   VLDL 5 02/02/2022 0424   LDLCALC 80 02/02/2022 0424     Assessment: 61 y.o. female with PMH of Acid reflux, Complication of anesthesia, History of COVID-19, and Hypertension, who presented to [facility] on 4/24 for evaluation of acute encephalopathy with associated hallucination and paranoia, complicated by hypercarbic respiratory failure. She has had a short course of MV and is s/p lumbar puncture.  She developed worsening respiratory function overnight 5/11 requiring intubation and mechanical ventilation. Lumbar puncture completed 02/20/22. CSF was clear and colorless with normal WBC and protein. Autoimmune encephalopathy panel is pending. Patient was extubated on 02/27/22. She is now on IVIG day 3/5 with improving mental status.  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  - EEG from 03/03/22: Continuous  generalized slowing. The EEG findings are suggestive of moderate diffuse encephalopathy, nonspecific to etiology. No seizures or epileptiform discharges were seen throughout the recording. - MRI brain 4/27: Parenchymal volume is normal. The ventricles are normal in size.Gray-white differentiation is preserved. 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  She had 5  days of IVIG without improvement and therefore less likely to be autoimmune syndrome however cannot completely exclude and results are still pending.  Patient is  obese with high BMI and on 3L [place] however, with serial ABGs showing PaCO2>60 and HCO3 >40. 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 Based on these labs she will need BiPAP if possible a titration study.   Recommendations: - Consult pulmonology. - Start BiPAP tonight if tolerated. - PT/OT, up out of bed. - Please call for question.   Electronically signed by:  Marisue HumbleHunter Nichol Ator, MD Page: [phone] 03/08/2022, 5:38 PM

## 2022-03-08 NOTE — Progress Notes (Signed)
Patient transferred to chair with stedy, required 2 staff to assist as well as family. Patient had difficulty following directions. Required about to transfer from stedy to chair as patient placed her R leg behind the stedy and had difficulty with directions when asked to place leg back on platform even with assistance to move leg forward.

## 2022-03-08 NOTE — Progress Notes (Signed)
Nutrition Follow-up  DOCUMENTATION CODES:  Obesity unspecified  INTERVENTION:  Liberalize diet to regular with no added salt packets Continue Ensure Enlive po BID, each supplement provides 350 kcal and 20 grams of protein. Continue vitamin regimen  NUTRITION DIAGNOSIS:  Inadequate oral intake related to acute illness as evidenced by meal completion < 50%. - remains applicable  GOAL:  Patient will meet greater than or equal to 90% of their needs - diet and supplements in place  MONITOR:  PO intake, Supplement acceptance, Weight trends, I & O's  REASON FOR ASSESSMENT:  Ventilator, Other (Comment) (Cortrak Consult)    ASSESSMENT:  61 yo female admitted with acute psychosis/delerium, developed acute on chronic respiratory failure and required transfer to ICU and intubation.  PMH includes HTN, GERD, hx of COVID-19 infection  4/25 Admitted 5/7 Intubated 5/8 IR for LP, Cortrak placed 5/9 Extubated 5/12 Re-intubated 5/14 - extubated 5/16 - Diet advanced to regular per SLP 5/17 - cortrak tube removed  Pt resting in bed at the time of assessment. Awake and alert, but husband at bedside provides the majority of answers today. Reports that over the last 24 hours, pt has been eating much better. States that prior to that though, intake has been an issue. Is drinking the ensures most of the time, did not consume the one given this AM but husband reports that she ate well at breakfast. Reviewed intake over the past week and pt is averaging <50% of her meals consumed. Will change diet order to regular with no salt packets on tray to offer more choices and continue ensure.  Average Meal Intake: 5/18-5/24: 40% intake x 4 recorded meals  Nutritionally Relevant Medications: Scheduled Meds:  atorvastatin  40 mg Oral QHS   Ensure Enlive  237 mL Oral BID BM   multivitamin with minerals  1 tablet Oral Daily   thiamine  100 mg Oral Daily   Labs Reviewed: Na 134, chloride 92 BUN <5,  creatinine .36  Nutrition Labs: Vitamin B12 2,964 Thiamine 133.6 Iron 42  NUTRITION - FOCUSED PHYSICAL EXAM: Flowsheet Row Most Recent Value  Orbital Region No depletion  Upper Arm Region No depletion  Thoracic and Lumbar Region No depletion  Buccal Region Unable to assess  Temple Region No depletion  Clavicle Bone Region No depletion  Clavicle and Acromion Bone Region No depletion  Scapular Bone Region No depletion  Dorsal Hand Unable to assess  Patellar Region Unable to assess  Anterior Thigh Region Unable to assess  Posterior Calf Region Unable to assess  Edema (RD Assessment) Moderate   Diet Order:   Diet Order             Diet regular Room service appropriate? Yes with Assist; Fluid consistency: Thin  Diet effective now                   EDUCATION NEEDS:  Not appropriate for education at this time  Skin:  Skin Assessment: Skin Integrity Issues: Skin Integrity Issues:: Other (Comment), Incisions Incisions: R buttocks, L groin Other: MARSI R labia  Last BM:  5/22 - type 5  Height:  Ht Readings from Last 1 Encounters:  03/05/22 5\' 1"  (1.549 m)    Weight:  Wt Readings from Last 1 Encounters:  03/05/22 101 kg    Ideal Body Weight:  47.7 kg  BMI:  Body mass index is 42.07 kg/m.  Estimated Nutritional Needs:  Kcal:  1550-1750 kcals Protein:  85-105g Fluid:  >/= 1.5 L  Ranell Patrick, RD, LDN Clinical Dietitian RD pager # available in [place]  After hours/weekend pager # available in [facility]

## 2022-03-08 NOTE — Progress Notes (Signed)
PROGRESS NOTE    Tamara Russell  [id number] DOB: 07-21-1961 DOA: 02/06/2022 PCP: Lucianne Lei, MD  Patient is a 61 y.o.  female morbid obesity, HTN, GERD-who was just discharged from this facility on 4/23-presented to the hospital on 4/24 with altered mental status.  Found to have metabolic encephalopathy.   Significant events: 4/19-4/23>> hospitalization for chest pain, confusion - UTI. 4/24 Presented to the ED for confusion-admit to [facility]. 4/28 Neuro eval - marital stress possibly causing AMS - psych consult recommended 5/01 Episode of aspiration - bradycardia/worsening hypoxemia - improved after suctioning.CXR with bibasilar opacities-IV Ancef started. 5/04 Neuro re-eval per psych rec's-?autoimmune process causing AMS 5/05 Trial of Depakote per neurology. 5/06 Significant sedation / lethargy following Haldol/Ativan -concern for hypercarbia -BiPAP started. LP postponed. Haldol/Ativan dosage adjusted by psychiatry. 5/07 Intubated, eeg neg neuro consulted.  5/08 LP completed. Cortrak placed. 5/09 Extubated, continues to have agitated delirium with psychosis, and then episodes of minimal responsiveness 5/12 Intubated in setting SBO and suspected aspiration 5/15 Extubated, respiratory status improving and mental status clearing 5/19 Started on IVIG per neurology for empiric autoimmune encephalitis. 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  Multiple EEGs without seizure but with ongoing encephalopathy. LP obtained without evidence of pleocytosis; autoimmune encephalopathy panel obtained and is pending. Neurology recommended IVIG for empiric treatment of autoimmune encephalitis after infectious work-up was  ruled out -Psychiatry recommended no changes to current medications; per their assessment, patient does not have capacity to make medical decisions. Metabolic work-up including TSH and free T4, cortisol level, ammonia, ethanol level, RPR, vitamin B1, vitamin B12, blood lead level, Depakote level not revealing. Repeat EEG 5/19 shows encephalopathy without any seizures. -I think hypercarbia could be contributing, ABG with CO2 in the 60s, add CPAP tonight, emphasized compliance with patient staff and spouse -Completed 5-day course of IVIG 5/23 -Continue thiamine and melatonin nightly -Delirium precautions, blinds open, up in the chair, during the daytime -Labs in a.m. -Family hopeful for CIR   Acute on chronic diastolic CHF (congestive heart failure) Elevated troponin -Has received Lasix as needed, currently euvolemic, monitor   Left distal DVT/acute -Found to have left distal tibial peroneal vein DVT this admission -Continue Eliquis   UTI (urinary tract infection) Urine culture grew E. coli, Enterococcus and Citrobacter not in a significant amount. Treated with fosfomycin earlier during admission.  Later actually completed 7 days of antibiotics for aspiration pneumonia. -Resolved   Aspiration pneumonia Dysphagia Leukocytosis Multiple episodes of aspiration this admission,Klebsiella isolated on ET aspirate. Completed antibiotic course for 7 days. Patient had a cortrak for tube feeding which was removed as patient was able to pass swallowing evaluation with speech therapy. -Monitor clinically   Ileus (HCC) Small bowel obstruction, has been ruled out. Patient evaluated by general surgery.  Per general surgery, more likely ileus.  Patient's symptoms appear to have resolved. Having bowel movements.   Acute on chronic respiratory failure with hypoxia and hypercapnia  Per family, oxygen is a new requirement from previous admission.  Hypercarbia mostly in the setting of obesity/OSA and  sedation from medications. Patient was intubated twice this admission secondary to aspiration issues. Now on supplemental oxygen via nasal canula. -Wean room air, check ABG as noted above   Obesity, Class III, BMI 40-49.9 (morbid obesity) (HCC) OSA (obstructive  sleep apnea) BMI is 42, was unable to tolerate BiPAP in the past, attempt CPAP tonight   Peptic ulcer disease GERD. Continuing PPI.  Hypertension blood pressure stable.  Hypokalemia, Hypernatremia Resolved.  Microcytic anemia, No bleeding.  Hemoglobin stable.   DVT prophylaxis: Apixaban Code Status: Full code Family Communication: Spouse at bedside Disposition Plan: Possibly CIR when mental status improves, more stable  Consultants: Neurology, psychiatry   Procedures:   Antimicrobials:    Objective: Vitals:   03/08/22 0350 03/08/22 0400 03/08/22 0740 03/08/22 1048  BP: 135/77  (!) 141/76   Pulse: 83  86 79  Resp: (!) 28 20 (!) 23 (!) 25  Temp: 98.2 F (36.8 C)  98.8 F (37.1 C)   TempSrc: Axillary  Oral   SpO2: 97%  96% 100%  Weight:      Height:       No intake or output data in the 24 hours ending 03/08/22 1427 Filed Weights   02/26/22 0500 02/27/22 0600 03/05/22 1800  Weight: 97 kg 101 kg 101 kg    Examination:  General exam: Pleasant chronically ill female sitting up in bed, opens eyes, follows some commands, slow response, oriented to self and place, partly to time CVS: S1-S2, regular rhythm Lungs: Poor air movement bilaterally otherwise. Abdomen: Soft, nontender, bowel sounds present Extremities: No edema Neuro: Bilateral lower extremity weakness, poor effort Skin: No rashes Psychiatry: Flat affect    Data Reviewed:   CBC: Recent Labs  Lab 03/03/22 0344 03/04/22 0423 03/05/22 0430 03/06/22 0300 03/08/22 0515  WBC 11.4* 13.4* 7.6 6.9 5.4  HGB 12.3 10.3* 9.7* 9.9* 9.6*  HCT 40.4 34.2* 32.7* 32.3* 32.2*  MCV 77.8* 78.6* 79.4* 79.0* 80.1  PLT 320 306 308 299 263   Basic  Metabolic Panel: Recent Labs  Lab 03/03/22 0344 03/04/22 0423 03/05/22 0430 03/08/22 0515  NA 140 137 135 134*  K 4.3 3.9 3.7 3.9  CL 98 98 95* 92*  CO2 34* 34* 37* 39*  GLUCOSE 91 121* 105* 88  BUN 11 12 12  <5*  CREATININE 0.51 0.45 0.34* 0.36*  CALCIUM 9.0 8.6* 8.8* 8.7*  MG 1.8 1.7 1.7  --    GFR: Estimated Creatinine Clearance: 78.5 mL/min (A) (by C-G formula based on SCr of 0.36 mg/dL (L)). Liver Function Tests: Recent Labs  Lab 03/08/22 0515  AST 21  ALT 12  ALKPHOS 46  BILITOT 0.2*  PROT 7.5  ALBUMIN 1.9*   No results for input(s): LIPASE, AMYLASE in the last 168 hours. No results for input(s): AMMONIA in the last 168 hours. Coagulation Profile: No results for input(s): INR, PROTIME in the last 168 hours. Cardiac Enzymes: No results for input(s): CKTOTAL, CKMB, CKMBINDEX, TROPONINI in the last 168 hours. BNP (last 3 results) No results for input(s): PROBNP in the last 8760 hours. HbA1C: No results for input(s): HGBA1C in the last 72 hours. CBG: Recent Labs  Lab 03/07/22 1939 03/07/22 2333 03/08/22 0349 03/08/22 0823 03/08/22 1122  GLUCAP 129* 92 89 140* 107*   Lipid Profile: No results for input(s): CHOL, HDL, LDLCALC, TRIG, CHOLHDL, LDLDIRECT in the last 72 hours. Thyroid Function Tests: No results for input(s): TSH, T4TOTAL, FREET4, T3FREE, THYROIDAB in the last 72 hours. Anemia Panel: No results for input(s): VITAMINB12, FOLATE, FERRITIN, TIBC, IRON, RETICCTPCT in the last 72 hours. Urine analysis:    Component Value Date/Time   COLORURINE YELLOW 02/06/2022 1425   APPEARANCEUR CLEAR 02/06/2022 1425   LABSPEC 1.023 02/06/2022 1425   PHURINE 7.0  02/06/2022 [place] 02/06/2022 1425   HGBUR NEGATIVE 02/06/2022 [place] 02/06/2022 1425   KETONESUR 80 (A) 02/06/2022 1425   PROTEINUR NEGATIVE 02/06/2022 1425   NITRITE NEGATIVE 02/06/2022 1425   LEUKOCYTESUR NEGATIVE 02/06/2022 1425   Sepsis  Labs: @LABRCNTIP (procalcitonin:4,lacticidven:4)  )No results found for this or any previous visit (from the past 240 hour(s)).   Radiology Studies: DG Knee Right Port  Result Date: 03/07/2022 CLINICAL DATA:  Right knee pain. EXAM: PORTABLE RIGHT KNEE - 1-2 VIEW COMPARISON:  Right femur radiographs 08/08/2013 FINDINGS: Moderate medial compartment joint space narrowing and mild peripheral degenerative osteophytosis. Mild peripheral lateral compartment degenerative osteophytosis. Mild patellofemoral joint space narrowing and peripheral osteophytosis. Mild chronic enthesopathic change at the quadriceps insertion on the patella, similar to prior. No acute fracture is seen. No dislocation. No joint effusion. 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 Electronically Signed   By: Yvonne Kendall M.D.   On: 03/07/2022 09:53     Scheduled Meds:  acetaminophen  500 mg Oral QHS   apixaban  10 mg Oral BID   Followed by   Derrill Memo ON 03/13/2022] apixaban  5 mg Oral BID   aspirin EC  81 mg Oral Daily   atorvastatin  40 mg Oral QHS   chlorhexidine  15 mL Mouth Rinse BID   Chlorhexidine Gluconate Cloth  6 each Topical Daily   feeding supplement  237 mL Oral BID BM   mouth rinse  15 mL Mouth Rinse BID   melatonin  3 mg Oral QHS   multivitamin with minerals  1 tablet Oral Daily   sodium chloride flush  10-40 mL Intracatheter Q12H   sodium chloride flush  3 mL Intravenous Q12H   thiamine  100 mg Oral Daily   Continuous Infusions:  sodium chloride Stopped (02/27/22 1532)     LOS: 29 days    Time spent: 61min    Domenic Polite, MD [facility]   03/08/2022, 2:27 PM

## 2022-03-08 NOTE — Progress Notes (Signed)
Placed pt on cpap with husband permission. Pt. Husband stated to RN that he wants pt. On cpap and that he will stay in room with pt. And monitor.

## 2022-03-09 DIAGNOSIS — G9341 Metabolic encephalopathy: Secondary | ICD-10-CM | POA: Diagnosis not present

## 2022-03-09 LAB — CBC
HCT: 31 % — ABNORMAL LOW (ref 36.0–46.0)
Hemoglobin: 9.4 g/dL — ABNORMAL LOW (ref 12.0–15.0)
MCH: 24 pg — ABNORMAL LOW (ref 26.0–34.0)
MCHC: 30.3 g/dL (ref 30.0–36.0)
MCV: 79.3 fL — ABNORMAL LOW (ref 80.0–100.0)
Platelets: 225 10*3/uL (ref 150–400)
RBC: 3.91 MIL/uL (ref 3.87–5.11)
RDW: 19 % — ABNORMAL HIGH (ref 11.5–15.5)
WBC: 6.3 10*3/uL (ref 4.0–10.5)
nRBC: 0 % (ref 0.0–0.2)

## 2022-03-09 LAB — COMPREHENSIVE METABOLIC PANEL
ALT: 12 U/L (ref 0–44)
AST: 23 U/L (ref 15–41)
Albumin: 1.9 g/dL — ABNORMAL LOW (ref 3.5–5.0)
Alkaline Phosphatase: 49 U/L (ref 38–126)
Anion gap: 5 (ref 5–15)
BUN: 7 mg/dL — ABNORMAL LOW (ref 8–23)
CO2: 37 mmol/L — ABNORMAL HIGH (ref 22–32)
Calcium: 8.6 mg/dL — ABNORMAL LOW (ref 8.9–10.3)
Chloride: 93 mmol/L — ABNORMAL LOW (ref 98–111)
Creatinine, Ser: 0.37 mg/dL — ABNORMAL LOW (ref 0.44–1.00)
GFR, Estimated: >60 mL/min (ref >60)
Glucose, Bld: 102 mg/dL — ABNORMAL HIGH (ref 70–99)
Potassium: 3.7 mmol/L (ref 3.5–5.1)
Sodium: 135 mmol/L (ref 135–145)
Total Bilirubin: 0.5 mg/dL (ref 0.3–1.2)
Total Protein: 7.1 g/dL (ref 6.5–8.1)

## 2022-03-09 LAB — AMMONIA: Ammonia: 24 umol/L (ref 9–35)

## 2022-03-09 LAB — URIC ACID: Uric Acid, Serum: 2.3 mg/dL — ABNORMAL LOW (ref 2.5–7.1)

## 2022-03-09 MED ORDER — IBUPROFEN 200 MG PO TABS
200.0000 mg | ORAL_TABLET | Freq: Four times a day (QID) | ORAL | Status: AC | PRN
  Administered 2022-03-09 – 2022-03-10 (×2): 200 mg via ORAL
  Filled 2022-03-09 (×2): qty 1

## 2022-03-09 MED ORDER — MELATONIN 5 MG PO TABS
5.0000 mg | ORAL_TABLET | Freq: Every day | ORAL | Status: DC
  Administered 2022-03-09: 5 mg via ORAL
  Filled 2022-03-09: qty 1

## 2022-03-09 MED ORDER — ROPINIROLE HCL 0.25 MG PO TABS
0.2500 mg | ORAL_TABLET | Freq: Every evening | ORAL | Status: DC
Start: 2022-03-09 — End: 2022-03-17
  Administered 2022-03-09 – 2022-03-16 (×8): 0.25 mg via ORAL
  Filled 2022-03-09 (×9): qty 1

## 2022-03-09 NOTE — Progress Notes (Signed)
PROGRESS NOTE    Tamara Russell  RFF:[id number] DOB: 08/07/1961 DOA: 02/06/2022 PCP: [name], MD  Patient is a 61 y.o.  female morbid obesity, HTN, GERD-who was just discharged from this facility on 4/23-presented to the hospital on 4/24 with altered mental status.  Found to have metabolic encephalopathy.   Significant events: 4/19-4/23>> hospitalization for chest pain, confusion - UTI. 4/24 Presented to the ED for confusion-admit to [facility] 4/28 Neuro eval - marital stress possibly causing AMS - psych consult recommended 5/01 Episode of aspiration - bradycardia/worsening hypoxemia - improved after suctioning.CXR with bibasilar opacities-IV Ancef started. 5/04 Neuro re-eval per psych rec's-?autoimmune process causing AMS 5/05 Trial of Depakote per neurology. 5/06 Significant sedation / lethargy following Haldol/Ativan -concern for hypercarbia -BiPAP started. LP postponed. Haldol/Ativan dosage adjusted by psychiatry. 5/07 Intubated, eeg neg neuro consulted.  5/08 LP completed. Cortrak placed. 5/09 Extubated, continues to have agitation, delirium with psychosis, and then episodes of minimal responsiveness 5/12 Intubated in setting SBO and suspected aspiration 5/15 Extubated, respiratory status improving and mental status clearing 5/19 Started on IVIG per neurology for empiric autoimmune encephalitis. 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  Multiple EEGs without seizure but with ongoing encephalopathy. LP obtained without evidence of pleocytosis; autoimmune encephalopathy panel obtained and is pending. Neurology recommended IVIG for empiric treatment of autoimmune encephalitis after infectious  work-up was ruled out -Psychiatry recommended no changes to current medications; per their assessment, patient does not have capacity to make medical decisions. Metabolic work-up including TSH and free T4, cortisol level, ammonia, ethanol level, RPR, vitamin B1, vitamin B12, blood lead level, Depakote level not revealing. Repeat EEG 5/19 shows encephalopathy without any seizures. -I think hypercarbia is contributing, ABG with CO2 in the 60s yesterday, will reattempt CPAP tonight, emphasized compliance with patient, family, add melatonin for sleep and Requip for restless legs -Completed 5-day course of IVIG 5/23 -Continue thiamine  -Delirium precautions, blinds open, up in the chair, during the daytime -Plan for SNF   Acute on chronic diastolic CHF (congestive heart failure) Elevated troponin -Has received Lasix as needed, currently euvolemic, monitor   Left distal DVT/acute -Found to have left distal tibial peroneal vein DVT this admission -Continue Eliquis   UTI (urinary tract infection) Urine culture grew E. coli, Enterococcus and Citrobacter not in a significant amount. Treated with fosfomycin earlier during admission.  Later actually completed 7 days of antibiotics for aspiration pneumonia. -Resolved   Aspiration pneumonia Dysphagia Leukocytosis Multiple episodes of aspiration this admission,Klebsiella isolated on ET aspirate. Completed antibiotic course for 7 days. Patient had a cortrak for tube feeding which was removed as patient was able to pass swallowing evaluation with speech therapy. -Monitor clinically   Ileus (HCC) Small bowel obstruction, has been ruled out. Patient evaluated by general surgery.  Per general surgery, more likely ileus.  Patient's symptoms appear to have resolved. Having bowel movements.   Acute on chronic respiratory failure with hypoxia and hypercapnia  Per family, oxygen is a new requirement from previous admission.  Hypercarbia mostly in the  setting of obesity/OSA and sedation from medications. Patient was intubated twice this admission secondary to aspiration issues. Now on supplemental oxygen via nasal canula.   Obesity, Class III, BMI 40-49.9 (morbid obesity) (HCC) OSA (obstructive sleep apnea) BMI is 42,  was unable to tolerate BiPAP in the past, attempt CPAP tonight   Peptic ulcer disease GERD. Continuing PPI.  Hypertension blood pressure stable.  Hypokalemia, Hypernatremia Resolved.  Microcytic anemia, No bleeding.  Hemoglobin stable.   DVT prophylaxis: Apixaban Code Status: Full code Family Communication: Spouse at bedside Disposition Plan: Likely SNF when mental status is more stable  Consultants: Neurology, psychiatry   Procedures:   Antimicrobials:    Objective: Vitals:   03/09/22 0354 03/09/22 0435 03/09/22 0731 03/09/22 1208  BP:   (!) 152/93 139/84  Pulse:   94 98  Resp:   (!) 31 19  Temp:   99.8 F (37.7 C) 99 F (37.2 C)  TempSrc:   Axillary Axillary  SpO2:  97% 96% 93%  Weight: 101.4 kg     Height:        Intake/Output Summary (Last 24 hours) at 03/09/2022 1328 Last data filed at 03/08/2022 1644 Gross per 24 hour  Intake --  Output 700 ml  Net -700 ml   Filed Weights   02/27/22 0600 03/05/22 1800 03/09/22 0354  Weight: 101 kg 101 kg 101.4 kg    Examination:  General exam: Pleasant chronically ill female sitting up in bed, opens eyes, follows some commands, slow response, oriented to self and place, partly to time CVS: S1-S2, regular rhythm Lungs: Poor air movement bilaterally otherwise. Abdomen: Soft, nontender, bowel sounds present Extremities: No edema Neuro: Bilateral lower extremity weakness, poor effort Skin: No rashes Psychiatry: Flat affect    Data Reviewed:   CBC: Recent Labs  Lab 03/04/22 0423 03/05/22 0430 03/06/22 0300 03/08/22 0515 03/09/22 0145  WBC 13.4* 7.6 6.9 5.4 6.3  HGB 10.3* 9.7* 9.9* 9.6* 9.4*  HCT 34.2* 32.7* 32.3* 32.2* 31.0*  MCV 78.6*  79.4* 79.0* 80.1 79.3*  PLT 306 308 299 263 225   Basic Metabolic Panel: Recent Labs  Lab 03/03/22 0344 03/04/22 0423 03/05/22 0430 03/08/22 0515 03/09/22 0145  NA 140 137 135 134* 135  K 4.3 3.9 3.7 3.9 3.7  CL 98 98 95* 92* 93*  CO2 34* 34* 37* 39* 37*  GLUCOSE 91 121* 105* 88 102*  BUN 11 12 12  <5* 7*  CREATININE 0.51 0.45 0.34* 0.36* 0.37*  CALCIUM 9.0 8.6* 8.8* 8.7* 8.6*  MG 1.8 1.7 1.7  --   --    GFR: Estimated Creatinine Clearance: 78.6 mL/min (A) (by C-G formula based on SCr of 0.37 mg/dL (L)). Liver Function Tests: Recent Labs  Lab 03/08/22 0515 03/09/22 0145  AST 21 23  ALT 12 12  ALKPHOS 46 49  BILITOT 0.2* 0.5  PROT 7.5 7.1  ALBUMIN 1.9* 1.9*   No results for input(s): LIPASE, AMYLASE in the last 168 hours. No results for input(s): AMMONIA in the last 168 hours. Coagulation Profile: No results for input(s): INR, PROTIME in the last 168 hours. Cardiac Enzymes: No results for input(s): CKTOTAL, CKMB, CKMBINDEX, TROPONINI in the last 168 hours. BNP (last 3 results) No results for input(s): PROBNP in the last 8760 hours. HbA1C: No results for input(s): HGBA1C in the last 72 hours. CBG: Recent Labs  Lab 03/07/22 2333 03/08/22 0349 03/08/22 0823 03/08/22 1122 03/08/22 1523  GLUCAP 92 89 140* 107* 141*   Lipid Profile: No results for input(s): CHOL, HDL, LDLCALC, TRIG, CHOLHDL, LDLDIRECT in the last 72 hours. Thyroid Function Tests: No results for input(s): TSH, T4TOTAL, FREET4, T3FREE, THYROIDAB in the last 72 hours. Anemia Panel: No results for input(s): VITAMINB12, FOLATE, FERRITIN, TIBC, IRON, RETICCTPCT  in the last 72 hours. Urine analysis:    Component Value Date/Time   COLORURINE YELLOW 02/06/2022 1425   APPEARANCEUR CLEAR 02/06/2022 1425   LABSPEC 1.023 02/06/2022 1425   PHURINE 7.0 02/06/2022 1425   GLUCOSEU NEGATIVE 02/06/2022 1425   HGBUR NEGATIVE 02/06/2022 1425   BILIRUBINUR NEGATIVE 02/06/2022 1425   KETONESUR 80 (A)  02/06/2022 1425   PROTEINUR NEGATIVE 02/06/2022 1425   NITRITE NEGATIVE 02/06/2022 1425   LEUKOCYTESUR NEGATIVE 02/06/2022 1425   Sepsis Labs: @LABRCNTIP (procalcitonin:4,lacticidven:4)  )No results found for this or any previous visit (from the past 240 hour(s)).   Radiology Studies: DG Ankle 2 Views Right  Result Date: 03/08/2022 CLINICAL DATA:  Right ankle pain. EXAM: RIGHT ANKLE - 2 VIEW COMPARISON:  None available FINDINGS: Moderate lateral malleolar soft tissue swelling. The ankle mortise is symmetric and intact. Joint spaces are preserved. Minimal dorsal navicular-cuneiform degenerative osteophytes. No acute fracture is seen. No dislocation. IMPRESSION: Moderate lateral malleolar soft tissue swelling. No acute fracture is seen. Electronically Signed   By: 03/10/2022 M.D.   On: 03/08/2022 17:19     Scheduled Meds:  acetaminophen  500 mg Oral QHS   apixaban  10 mg Oral BID   Followed by   03/10/2022 ON 03/13/2022] apixaban  5 mg Oral BID   aspirin EC  81 mg Oral Daily   atorvastatin  40 mg Oral QHS   chlorhexidine  15 mL Mouth Rinse BID   Chlorhexidine Gluconate Cloth  6 each Topical Daily   feeding supplement  237 mL Oral BID BM   mouth rinse  15 mL Mouth Rinse BID   melatonin  5 mg Oral QHS   multivitamin with minerals  1 tablet Oral Daily   rOPINIRole  0.25 mg Oral QPM   sodium chloride flush  10-40 mL Intracatheter Q12H   sodium chloride flush  3 mL Intravenous Q12H   thiamine  100 mg Oral Daily   Continuous Infusions:  sodium chloride Stopped (02/27/22 1532)     LOS: 30 days    Time spent: 03/01/22  , MD [facility]   03/09/2022, 1:28 PM

## 2022-03-09 NOTE — Progress Notes (Signed)
RN was called into the room by daughter to assess concerns for breathing.  Patients RR is up in the mid 30's, HR is low 100's and BP is elevated. Lungs sound clear and daughter said there has not been any coughing during meals. Patient is continuing to not engage much with staff, but she is very alert.  MD has been notified. Will update oncoming RN.

## 2022-03-09 NOTE — Progress Notes (Signed)
Patient increasingly agitated by CPAP, becoming more difficult to redirect. Keeps attempting to remove it and has not slept all night. With discussion with her husband, it was decided that patient would be placed back on nasal cannula for the time being. Will reattempt CPAP later. Patient able to tolerate wearing it for approximately 6hrs overnight. Spo2>95 on 3L .

## 2022-03-09 NOTE — Plan of Care (Signed)
  Problem: Health Behavior/Discharge Planning: Goal: Ability to manage health-related needs will improve Outcome: Progressing   Problem: Clinical Measurements: Goal: Ability to maintain clinical measurements within normal limits will improve Outcome: Progressing Goal: Will remain free from infection Outcome: Progressing Goal: Diagnostic test results will improve Outcome: Progressing Goal: Respiratory complications will improve Outcome: Progressing   Problem: Activity: Goal: Risk for activity intolerance will decrease Outcome: Progressing   Problem: Nutrition: Goal: Adequate nutrition will be maintained Outcome: Progressing   Problem: Coping: Goal: Level of anxiety will decrease Outcome: Progressing   Problem: Elimination: Goal: Will not experience complications related to bowel motility Outcome: Progressing Goal: Will not experience complications related to urinary retention Outcome: Progressing   Problem: Pain Managment: Goal: General experience of comfort will improve Outcome: Progressing   Problem: Safety: Goal: Ability to remain free from injury will improve Outcome: Progressing   Problem: Skin Integrity: Goal: Risk for impaired skin integrity will decrease Outcome: Progressing   Problem: Education: Goal: Knowledge of General Education information will improve Description: Including pain rating scale, medication(s)/side effects and non-pharmacologic comfort measures Outcome: Progressing   Problem: Health Behavior/Discharge Planning: Goal: Ability to manage health-related needs will improve Outcome: Progressing   Problem: Clinical Measurements: Goal: Ability to maintain clinical measurements within normal limits will improve Outcome: Progressing Goal: Will remain free from infection Outcome: Progressing Goal: Diagnostic test results will improve Outcome: Progressing Goal: Respiratory complications will improve Outcome: Progressing Goal: Cardiovascular  complication will be avoided Outcome: Progressing   Problem: Activity: Goal: Risk for activity intolerance will decrease Outcome: Progressing   Problem: Nutrition: Goal: Adequate nutrition will be maintained Outcome: Progressing   Problem: Coping: Goal: Level of anxiety will decrease Outcome: Progressing   Problem: Elimination: Goal: Will not experience complications related to bowel motility Outcome: Progressing Goal: Will not experience complications related to urinary retention Outcome: Progressing   Problem: Pain Managment: Goal: General experience of comfort will improve Outcome: Progressing   Problem: Safety: Goal: Ability to remain free from injury will improve Outcome: Progressing   Problem: Skin Integrity: Goal: Risk for impaired skin integrity will decrease Outcome: Progressing   Problem: Urinary Elimination: Goal: Signs and symptoms of infection will decrease Outcome: Progressing   

## 2022-03-09 NOTE — Progress Notes (Signed)
Occupational Therapy Treatment Patient Details Name: Tamara Russell MRN: [id number] DOB: 1961-06-20 Today's Date: 03/09/2022   History of present illness 61 y.o. female who presents after being found altered this morning sitting on the porch. Patient had just recently been hospitalized from 4/19-4/23 after presenting for altered mental status. MRI negative; Psychiatry consult due to recent home stressors and pt with hallucinations; +delirium. Pt intubated 5/7-5/9, re-intubated 5/11-5/14 with concern for aspiration PNA and SBO. PMH significant of HTN, reformed smoker, recently hypoxic and started on home O2, and obesity.   OT comments  Patient received in supine with family present and alert. Patient was mod assist to get to EOB with use of bed pad to assist and assistance with BLEs. Patient able to maintain sitting balance on EOB with min guard to supervision. Patietn was able to perform squat pivot transfer of one to recliner with use of bed pad to aide in clearing bed. Patient performed grooming and AAROM exercises while seated in recliner and became more lethargic at end of session. Acute OT to continue to follow.    Recommendations for follow up therapy are one component of a multi-disciplinary discharge planning process, led by the attending physician.  Recommendations may be updated based on patient status, additional functional criteria and insurance authorization.    Follow Up Recommendations  Skilled nursing-short term rehab (<3 hours/day)    Assistance Recommended at Discharge Frequent or constant Supervision/Assistance  Patient can return home with the following  Two people to help with walking and/or transfers;Two people to help with bathing/dressing/bathroom;Assistance with cooking/housework;Assistance with feeding;Help with stairs or ramp for entrance;Assist for transportation;Direct supervision/assist for financial management;Direct supervision/assist for medications management    Equipment Recommendations  Other (comment) (TBD)    Recommendations for Other Services      Precautions / Restrictions Precautions Precautions: Fall Precaution Comments: delirium Restrictions Weight Bearing Restrictions: No       Mobility Bed Mobility Overal bed mobility: Needs Assistance Bed Mobility: Supine to Sit     Supine to sit: Mod assist, HOB elevated     General bed mobility comments: bed pad used to assist and required assistance with BLEs    Transfers Overall transfer level: Needs assistance Equipment used: None Transfers: Bed to chair/wheelchair/BSC     Squat pivot transfers: Max assist       General transfer comment: squat pivot transfer to recliner using bed pad to assist with clearing bed     Balance Overall balance assessment: Needs assistance Sitting-balance support: Feet supported, Bilateral upper extremity supported Sitting balance-Leahy Scale: Fair Sitting balance - Comments: supervision to min guard for sitting balance on EOB                                   ADL either performed or assessed with clinical judgement   ADL Overall ADL's : Needs assistance/impaired     Grooming: Wash/dry hands;Wash/dry face;Supervision/safety;Sitting Grooming Details (indicate cue type and reason): in recliner             Lower Body Dressing: Total assistance;Bed level Lower Body Dressing Details (indicate cue type and reason): to donn socks               General ADL Comments: difficulty following commands    Extremity/Trunk Assessment Upper Extremity Assessment RUE Deficits / Details: very weak grip, unable to reach mouth, or lift arm off bed RUE Sensation: WNL RUE Coordination: decreased fine  motor;decreased gross motor LUE Deficits / Details: very weak grip, unable to reach mouth, or lift arm off bed LUE Sensation: WNL LUE Coordination: decreased fine motor;decreased gross motor            Vision        Perception     Praxis      Cognition Arousal/Alertness: Awake/alert Behavior During Therapy: WFL for tasks assessed/performed Overall Cognitive Status: Impaired/Different from baseline Area of Impairment: Orientation, Following commands, Safety/judgement, Problem solving, Attention, Memory, Awareness                 Orientation Level: Disoriented to, Place, Time, Situation Current Attention Level: Focused (easily distracted) Memory: Decreased short-term memory Following Commands: Follows one step commands inconsistently, Follows one step commands with increased time Safety/Judgement: Decreased awareness of safety, Decreased awareness of deficits Awareness: Intellectual Problem Solving: Slow processing, Decreased initiation, Difficulty sequencing, Requires verbal cues, Requires tactile cues General Comments: followed commands after being repeated due to staring off. Perseverated on having diabetes and needing to call doctor.        Exercises Exercises: General Upper Extremity General Exercises - Upper Extremity Shoulder Flexion: AAROM, Both, 10 reps Shoulder ABduction: AAROM, Both, 10 reps, Seated    Shoulder Instructions       General Comments      Pertinent Vitals/ Pain       Pain Assessment Pain Assessment: Faces Faces Pain Scale: Hurts little more Pain Location: generalized, "arthritis" Pain Descriptors / Indicators: Discomfort, Grimacing Pain Intervention(s): Limited activity within patient's tolerance, Monitored during session, Repositioned  Home Living                                          Prior Functioning/Environment              Frequency  Min 2X/week        Progress Toward Goals  OT Goals(current goals can now be found in the care plan section)  Progress towards OT goals: Progressing toward goals  Acute Rehab OT Goals Patient Stated Goal: get stronger OT Goal Formulation: With patient/family Time For Goal  Achievement: 03/13/22 Potential to Achieve Goals: Good ADL Goals Pt Will Perform Grooming: with min guard assist;sitting Pt Will Perform Upper Body Dressing: with min assist;sitting Pt Will Perform Lower Body Dressing: with mod assist;sit to/from stand Pt Will Transfer to Toilet: with mod assist;stand pivot transfer;bedside commode Pt Will Perform Toileting - Clothing Manipulation and hygiene: with supervision;sitting/lateral leans;sit to/from stand Additional ADL Goal #1: Tolerate sitting edge of bed unsupported up to 10 min to increase ADL independence Additional ADL Goal #2: Pt will demonstrate improved cognition by consistently following 2 step instructions, and initiating ADLs when needed without cues.  Plan Discharge plan remains appropriate    Co-evaluation                 AM-PAC OT "6 Clicks" Daily Activity     Outcome Measure   Help from another person eating meals?: A Lot Help from another person taking care of personal grooming?: A Little Help from another person toileting, which includes using toliet, bedpan, or urinal?: Total Help from another person bathing (including washing, rinsing, drying)?: A Lot Help from another person to put on and taking off regular upper body clothing?: A Lot Help from another person to put on and taking off regular lower body clothing?: Total 6 Click Score:  11    End of Session Equipment Utilized During Treatment: Oxygen  OT Visit Diagnosis: Unsteadiness on feet (R26.81);Other symptoms and signs involving cognitive function;Other abnormalities of gait and mobility (R26.89);Muscle weakness (generalized) (M62.81);Pain   Activity Tolerance Patient limited by lethargy   Patient Left in chair;with call bell/phone within reach;with chair alarm set;with family/visitor present   Nurse Communication Mobility status;Need for lift equipment        Time: 0939-1006 OT Time Calculation (min): 27 min  Charges: OT General Charges $OT Visit:  1 Visit OT Treatments $Self Care/Home Management : 8-22 mins $Therapeutic Activity: 8-22 mins  Alfonse Flavorsick Lenox Ladouceur, OTA Acute Rehabilitation Services  Pager [phone] Office [phone]   Dewain PenningRickie L Maui Britten 03/09/2022, 10:59 AM

## 2022-03-10 ENCOUNTER — Inpatient Hospital Stay (HOSPITAL_COMMUNITY): Payer: 59

## 2022-03-10 DIAGNOSIS — G9341 Metabolic encephalopathy: Secondary | ICD-10-CM | POA: Diagnosis not present

## 2022-03-10 LAB — BASIC METABOLIC PANEL
Anion gap: 4 — ABNORMAL LOW (ref 5–15)
BUN: 5 mg/dL — ABNORMAL LOW (ref 8–23)
CO2: 36 mmol/L — ABNORMAL HIGH (ref 22–32)
Calcium: 8.5 mg/dL — ABNORMAL LOW (ref 8.9–10.3)
Chloride: 92 mmol/L — ABNORMAL LOW (ref 98–111)
Creatinine, Ser: 0.41 mg/dL — ABNORMAL LOW (ref 0.44–1.00)
GFR, Estimated: >60 mL/min (ref >60)
Glucose, Bld: 101 mg/dL — ABNORMAL HIGH (ref 70–99)
Potassium: 3.3 mmol/L — ABNORMAL LOW (ref 3.5–5.1)
Sodium: 132 mmol/L — ABNORMAL LOW (ref 135–145)

## 2022-03-10 LAB — GLUCOSE, CAPILLARY
Glucose-Capillary: 99 mg/dL (ref 70–99)
Glucose-Capillary: 105 mg/dL — ABNORMAL HIGH (ref 70–99)
Glucose-Capillary: 123 mg/dL — ABNORMAL HIGH (ref 70–99)

## 2022-03-10 MED ORDER — FUROSEMIDE 20 MG PO TABS
20.0000 mg | ORAL_TABLET | Freq: Every day | ORAL | Status: DC
  Administered 2022-03-10: 20 mg via ORAL
  Filled 2022-03-10: qty 1

## 2022-03-10 MED ORDER — POTASSIUM CHLORIDE CRYS ER 20 MEQ PO TBCR
40.0000 meq | EXTENDED_RELEASE_TABLET | Freq: Once | ORAL | Status: AC
  Administered 2022-03-10: 40 meq via ORAL
  Filled 2022-03-10: qty 2

## 2022-03-10 MED ORDER — MELATONIN 5 MG PO TABS
5.0000 mg | ORAL_TABLET | Freq: Every evening | ORAL | Status: DC
Start: 2022-03-10 — End: 2022-03-17
  Administered 2022-03-10 – 2022-03-16 (×7): 5 mg via ORAL
  Filled 2022-03-10 (×8): qty 1

## 2022-03-10 MED ORDER — POTASSIUM CHLORIDE CRYS ER 20 MEQ PO TBCR
20.0000 meq | EXTENDED_RELEASE_TABLET | Freq: Every day | ORAL | Status: DC
  Administered 2022-03-10 – 2022-03-11 (×2): 20 meq via ORAL
  Filled 2022-03-10 (×2): qty 1

## 2022-03-10 NOTE — Progress Notes (Addendum)
PROGRESS NOTE    Tamara Russell  ZOX:[id number] DOB: 11-11-1960 DOA: 02/06/2022 PCP: Renaye RakersBland, Veita, MD  Patient is a 61 y.o.  female morbid obesity, HTN, GERD-who was just discharged from this facility on 4/23-presented to the hospital on 4/24 with altered mental status.  Found to have metabolic encephalopathy.   Significant events: 4/19-4/23>> hospitalization for chest pain, confusion - UTI. 4/24 Presented to the ED for confusion-[facility] 4/28 Neuro eval - marital stress possibly causing AMS - psych consult recommended 5/01 Episode of aspiration - bradycardia/worsening hypoxemia - improved after suctioning.CXR with bibasilar opacities-IV Ancef started. 5/04 Neuro re-eval per psych rec's-?autoimmune process causing AMS 5/05 Trial of Depakote per neurology. 5/06 Significant sedation / lethargy following Haldol/Ativan -concern for hypercarbia -BiPAP started. LP postponed. Haldol/Ativan dosage adjusted by psychiatry. 5/07 Intubated, eeg neg neuro consulted.  5/08 LP completed. Cortrak placed. 5/09 Extubated, continues to have agitation, delirium with psychosis, and then episodes of minimal responsiveness 5/12 Intubated in setting SBO and suspected aspiration 5/15 Extubated, respiratory status improving and mental status clearing 5/19 Started on IVIG per neurology for empiric autoimmune encephalitis. 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  Multiple EEGs without seizure activity LP obtained without evidence of pleocytosis; autoimmune encephalopathy panel obtained and is pending. Neurology recommended IVIG for empiric treatment of autoimmune  encephalitis after infectious work-up was ruled out -Psychiatry recommended no changes to current medications; per their assessment, patient does not have capacity to make medical decisions. Metabolic work-up including TSH and free T4, cortisol level, ammonia, ethanol level, RPR, vitamin B1, vitamin B12, blood lead level, Depakote level not revealing. -I think hypercarbia is contributing, ABG with CO2 in the 60s 5/24, restarted on CPAP, fortunately he was able to tolerate this last night, continue melatonin for sleep and Requip for restless legs -Completed 5-day course of IVIG 5/23 -Continue thiamine  -Delirium precautions, blinds open, up in the chair, during the daytime -Plan for SNF early next week if stable, TOC following   Acute on chronic diastolic CHF (congestive heart failure) Elevated troponin -Has received Lasix as needed, currently euvolemic, monitor -Resume oral Lasix   Left distal DVT/acute -Found to have left distal tibial peroneal vein DVT this admission -Continue Eliquis   UTI (urinary tract infection) Urine culture grew E. coli, Enterococcus and Citrobacter not in a significant amount. Treated with fosfomycin earlier during admission.  Later actually completed 7 days of antibiotics for aspiration pneumonia. -Resolved   Aspiration pneumonia Dysphagia Leukocytosis Multiple episodes of aspiration this admission,Klebsiella isolated on ET aspirate. Completed antibiotic course for 7 days. Patient had a cortrak for tube feeding which was removed as patient was able to pass swallowing evaluation with speech therapy. -Monitor clinically   Ileus (HCC) Small bowel obstruction, has been ruled out. Patient evaluated by general surgery.  Per general surgery, more likely ileus.  Patient's symptoms appear to have resolved. Having bowel movements.   Acute on chronic respiratory failure with hypoxia and hypercapnia  Per family, oxygen is a new requirement from previous admission.   Hypercarbia mostly in the setting of obesity/OSA and sedation from medications. Patient was intubated twice this admission secondary to aspiration issues. Now on supplemental oxygen via nasal canula.   Obesity, Class III, BMI 40-49.9 (  morbid obesity) (HCC) OSA (obstructive sleep apnea) BMI is 42, was unable to tolerate BiPAP in the past, continue CPAP   Peptic ulcer disease GERD. Continuing PPI.  Hypertension blood pressure stable.  Hypokalemia, Hypernatremia Resolved.  Microcytic anemia, No bleeding.  Hemoglobin stable.   DVT prophylaxis: Apixaban Code Status: Full code Family Communication: Spouse at bedside Disposition Plan: Likely SNF when mental status is more stable  Consultants: Neurology, psychiatry   Procedures:   Antimicrobials:    Objective: Vitals:   03/09/22 2305 03/10/22 0305 03/10/22 0724 03/10/22 1109  BP: (!) 152/82 (!) 130/94 (!) 149/88 (!) 146/75  Pulse: 93 93 90 87  Resp: (!) 26 (!) 24 20 20   Temp: 98.2 F (36.8 C) 99.2 F (37.3 C) 99.2 F (37.3 C) 99.5 F (37.5 C)  TempSrc: Axillary Axillary Axillary Oral  SpO2: 96% 100% 98% 94%  Weight:      Height:        Intake/Output Summary (Last 24 hours) at 03/10/2022 1310 Last data filed at 03/10/2022 0800 Gross per 24 hour  Intake 120 ml  Output --  Net 120 ml   Filed Weights   02/27/22 0600 03/05/22 1800 03/09/22 0354  Weight: 101 kg 101 kg 101.4 kg    Examination:  General exam: Pleasant chronically ill female sitting up in bed, eyes open, follows commands, oriented to self and place, partly to time,, mild delay in response CVS: S1-S2, regular rhythm Lungs: Improved air movement, decreased at the bases Abdomen: Soft, obese, nontender, bowel sounds present Extremities: No edema  Neuro: Bilateral lower extremity weakness, poor effort Skin: No rashes Psychiatry: Flat affect    Data Reviewed:   CBC: Recent Labs  Lab 03/04/22 0423 03/05/22 0430 03/06/22 0300 03/08/22 0515  03/09/22 0145  WBC 13.4* 7.6 6.9 5.4 6.3  HGB 10.3* 9.7* 9.9* 9.6* 9.4*  HCT 34.2* 32.7* 32.3* 32.2* 31.0*  MCV 78.6* 79.4* 79.0* 80.1 79.3*  PLT 306 308 299 263 225   Basic Metabolic Panel: Recent Labs  Lab 03/04/22 0423 03/05/22 0430 03/08/22 0515 03/09/22 0145 03/10/22 0525  NA 137 135 134* 135 132*  K 3.9 3.7 3.9 3.7 3.3*  CL 98 95* 92* 93* 92*  CO2 34* 37* 39* 37* 36*  GLUCOSE 121* 105* 88 102* 101*  BUN 12 12 <5* 7* 5*  CREATININE 0.45 0.34* 0.36* 0.37* 0.41*  CALCIUM 8.6* 8.8* 8.7* 8.6* 8.5*  MG 1.7 1.7  --   --   --    GFR: Estimated Creatinine Clearance: 78.6 mL/min (A) (by C-G formula based on SCr of 0.41 mg/dL (L)). Liver Function Tests: Recent Labs  Lab 03/08/22 0515 03/09/22 0145  AST 21 23  ALT 12 12  ALKPHOS 46 49  BILITOT 0.2* 0.5  PROT 7.5 7.1  ALBUMIN 1.9* 1.9*   No results for input(s): LIPASE, AMYLASE in the last 168 hours. Recent Labs  Lab 03/09/22 1506  AMMONIA 24   Coagulation Profile: No results for input(s): INR, PROTIME in the last 168 hours. Cardiac Enzymes: No results for input(s): CKTOTAL, CKMB, CKMBINDEX, TROPONINI in the last 168 hours. BNP (last 3 results) No results for input(s): PROBNP in the last 8760 hours. HbA1C: No results for input(s): HGBA1C in the last 72 hours. CBG: Recent Labs  Lab 03/08/22 0823 03/08/22 1122 03/08/22 1523 03/10/22 0712 03/10/22 1108  GLUCAP 140* 107* 141* 99 105*   Lipid Profile: No results for input(s): CHOL, HDL, LDLCALC, TRIG, CHOLHDL, LDLDIRECT in the last 72 hours. Thyroid Function Tests:  No results for input(s): TSH, T4TOTAL, FREET4, T3FREE, THYROIDAB in the last 72 hours. Anemia Panel: No results for input(s): VITAMINB12, FOLATE, FERRITIN, TIBC, IRON, RETICCTPCT in the last 72 hours. Urine analysis:    Component Value Date/Time   COLORURINE YELLOW 02/06/2022 1425   APPEARANCEUR CLEAR 02/06/2022 1425   LABSPEC 1.023 02/06/2022 1425   PHURINE 7.0 02/06/2022 1425   GLUCOSEU  NEGATIVE 02/06/2022 1425   HGBUR NEGATIVE 02/06/2022 1425   BILIRUBINUR NEGATIVE 02/06/2022 1425   KETONESUR 80 (A) 02/06/2022 1425   PROTEINUR NEGATIVE 02/06/2022 1425   NITRITE NEGATIVE 02/06/2022 1425   LEUKOCYTESUR NEGATIVE 02/06/2022 1425   Sepsis Labs: @LABRCNTIP (procalcitonin:4,lacticidven:4)  )No results found for this or any previous visit (from the past 240 hour(s)).   Radiology Studies: DG Ankle 2 Views Right  Result Date: 03/08/2022 CLINICAL DATA:  Right ankle pain. EXAM: RIGHT ANKLE - 2 VIEW COMPARISON:  None available FINDINGS: Moderate lateral malleolar soft tissue swelling. The ankle mortise is symmetric and intact. Joint spaces are preserved. Minimal dorsal navicular-cuneiform degenerative osteophytes. No acute fracture is seen. No dislocation. IMPRESSION: Moderate lateral malleolar soft tissue swelling. No acute fracture is seen. Electronically Signed   By: 03/10/2022 M.D.   On: 03/08/2022 17:19     Scheduled Meds:  apixaban  10 mg Oral BID   Followed by   03/10/2022 ON 03/13/2022] apixaban  5 mg Oral BID   aspirin EC  81 mg Oral Daily   atorvastatin  40 mg Oral QHS   chlorhexidine  15 mL Mouth Rinse BID   Chlorhexidine Gluconate Cloth  6 each Topical Daily   feeding supplement  237 mL Oral BID BM   mouth rinse  15 mL Mouth Rinse BID   melatonin  5 mg Oral QPM   multivitamin with minerals  1 tablet Oral Daily   rOPINIRole  0.25 mg Oral QPM   sodium chloride flush  10-40 mL Intracatheter Q12H   sodium chloride flush  3 mL Intravenous Q12H   thiamine  100 mg Oral Daily   Continuous Infusions:  sodium chloride Stopped (02/27/22 1532)     LOS: 31 days    Time spent: 03/01/22  , MD [facility]   03/10/2022, 1:10 PM

## 2022-03-10 NOTE — Progress Notes (Signed)
Physical Therapy Treatment Patient Details Name: Tamara Russell MRN: [id number] DOB: Nov 22, 1960 Today's Date: 03/10/2022   History of Present Illness 61 y.o. female who presents after being found altered this morning sitting on the porch. Patient had just recently been hospitalized from 4/19-4/23 after presenting for altered mental status. MRI negative; Psychiatry consult due to recent home stressors and pt with hallucinations; +delirium. Pt intubated 5/7-5/9, re-intubated 5/11-5/14 with concern for aspiration PNA and SBO. PMH significant of HTN, reformed smoker, recently hypoxic and started on home O2, and obesity.    PT Comments    After the first few minutes, pt transitioned into a state of low response, slack-jawed with staring and unable to assist or verbalize anything other than grunts.  Pt unable to assist coming to EOB with scooting, she listed to the right during sitting balance, was unable to initiate or follow through with standing trial and offered no assist during total assist transfer to the recline.  Pt's son came in at end on the session to see the state the pt was in, stating that this has be her norm for over a month.    Recommendations for follow up therapy are one component of a multi-disciplinary discharge planning process, led by the attending physician.  Recommendations may be updated based on patient status, additional functional criteria and insurance authorization.  Follow Up Recommendations  Skilled nursing-short term rehab (<3 hours/day)     Assistance Recommended at Discharge Frequent or constant Supervision/Assistance  Patient can return home with the following Two people to help with walking and/or transfers;Two people to help with bathing/dressing/bathroom;Assistance with cooking/housework;Direct supervision/assist for medications management;Direct supervision/assist for financial management;Assist for transportation;Help with stairs or ramp for entrance    Equipment Recommendations  None recommended by PT    Recommendations for Other Services       Precautions / Restrictions Precautions Precautions: Fall Precaution Comments: delirium     Mobility  Bed Mobility Overal bed mobility: Needs Assistance Bed Mobility: Supine to Sit     Supine to sit: Max assist, HOB elevated     General bed mobility comments: pt initially stated she couldn't get her legs to move to help,  assisted LE's and trunk to come forward.  Mid scooting attempt/assist, pt became slack jawed and unable to respond other than staring at this therapist.  Continued scooting produced grunts only.    Transfers Overall transfer level: Needs assistance Equipment used: None Transfers: Bed to chair/wheelchair/BSC, Sit to/from Stand Sit to Stand: Total assist     Squat pivot transfers: Total assist, +2 physical assistance, +2 safety/equipment     General transfer comment: squat pivot transfer to recliner using bed pad to assist with clearing bed.  No assist from pt.  pt in her unresponsive state, eyes open staring, slack-jawed.    Ambulation/Gait               General Gait Details: Unable.   Stairs             Wheelchair Mobility    Modified Rankin (Stroke Patients Only)       Balance Overall balance assessment: Needs assistance Sitting-balance support: Feet supported, Bilateral upper extremity supported Sitting balance-Leahy Scale: Poor (in this stupor or state previously describe, pt not sitting in midline without assist.)     Standing balance support: During functional activity  Cognition Arousal/Alertness: Awake/alert Behavior During Therapy: Flat affect Overall Cognitive Status: Difficult to assess                                          Exercises Other Exercises Other Exercises: hip/knee flex/ext ROM A to graded asssit.  painful R LE with ROM    General Comments  General comments (skin integrity, edema, etc.): pt in a low response, slack-jawed, staring state throughout most of the treatment and unable to assist.  Pt's son stating this hs been going on for over a month.  VSS overall.      Pertinent Vitals/Pain Pain Assessment Pain Assessment: Faces Faces Pain Scale: Hurts little more Pain Location: R knee arthritis. Pain Descriptors / Indicators: Discomfort, Grimacing Pain Intervention(s): Monitored during session, Limited activity within patient's tolerance    Home Living                          Prior Function            PT Goals (current goals can now be found in the care plan section) Acute Rehab PT Goals Patient Stated Goal: To get in and out of bed more easily. PT Goal Formulation: With family Time For Goal Achievement: 03/15/22 Potential to Achieve Goals: Fair Progress towards PT goals: Not progressing toward goals - comment    Frequency    Min 3X/week      PT Plan Current plan remains appropriate    Co-evaluation              AM-PAC PT "6 Clicks" Mobility   Outcome Measure  Help needed turning from your back to your side while in a flat bed without using bedrails?: Total Help needed moving from lying on your back to sitting on the side of a flat bed without using bedrails?: Total Help needed moving to and from a bed to a chair (including a wheelchair)?: Total Help needed standing up from a chair using your arms (e.g., wheelchair or bedside chair)?: Total Help needed to walk in hospital room?: Total Help needed climbing 3-5 steps with a railing? : Total 6 Click Score: 6    End of Session Equipment Utilized During Treatment: Oxygen Activity Tolerance: Other (comment) (pt in a low response state with ?inability? to assist.) Patient left: in chair;with call bell/phone within reach;Other (comment) (on a lift pad to aide return to the bed.) Nurse Communication: Mobility status;Need for lift equipment;Other  (comment) PT Visit Diagnosis: Other abnormalities of gait and mobility (R26.89);Muscle weakness (generalized) (M62.81);Other symptoms and signs involving the nervous system (R29.898)     Time: 2831-5176 PT Time Calculation (min) (ACUTE ONLY): 24 min  Charges:  $Therapeutic Activity: 8-22 mins $Neuromuscular Re-education: 8-22 mins                     03/10/2022  Jacinto Halim., PT Acute Rehabilitation Services [phone]  (pager) [phone]  (office)   Tamara Russell 03/10/2022, 1:34 PM

## 2022-03-10 NOTE — Progress Notes (Signed)
Nurse requested at bedside by family d/t concerns of patients; breathing. Patient assessed, does not appear to be in distress. Patient with o2 on and saturation of 92%. Mitchel Honour, MD notified. No new orders at this time.  Melony Overly, RN

## 2022-03-11 DIAGNOSIS — G9341 Metabolic encephalopathy: Secondary | ICD-10-CM | POA: Diagnosis not present

## 2022-03-11 LAB — BASIC METABOLIC PANEL
Anion gap: 5 (ref 5–15)
BUN: 8 mg/dL (ref 8–23)
CO2: 35 mmol/L — ABNORMAL HIGH (ref 22–32)
Calcium: 8.4 mg/dL — ABNORMAL LOW (ref 8.9–10.3)
Chloride: 94 mmol/L — ABNORMAL LOW (ref 98–111)
Creatinine, Ser: 0.4 mg/dL — ABNORMAL LOW (ref 0.44–1.00)
GFR, Estimated: >60 mL/min (ref >60)
Glucose, Bld: 92 mg/dL (ref 70–99)
Potassium: 3.8 mmol/L (ref 3.5–5.1)
Sodium: 134 mmol/L — ABNORMAL LOW (ref 135–145)

## 2022-03-11 LAB — CBC
HCT: 30.8 % — ABNORMAL LOW (ref 36.0–46.0)
Hemoglobin: 9.2 g/dL — ABNORMAL LOW (ref 12.0–15.0)
MCH: 23.5 pg — ABNORMAL LOW (ref 26.0–34.0)
MCHC: 29.9 g/dL — ABNORMAL LOW (ref 30.0–36.0)
MCV: 78.8 fL — ABNORMAL LOW (ref 80.0–100.0)
Platelets: 187 10*3/uL (ref 150–400)
RBC: 3.91 MIL/uL (ref 3.87–5.11)
RDW: 18.9 % — ABNORMAL HIGH (ref 11.5–15.5)
WBC: 5.8 10*3/uL (ref 4.0–10.5)
nRBC: 0 % (ref 0.0–0.2)

## 2022-03-11 LAB — GLUCOSE, CAPILLARY: Glucose-Capillary: 102 mg/dL — ABNORMAL HIGH (ref 70–99)

## 2022-03-11 MED ORDER — FUROSEMIDE 10 MG/ML IJ SOLN
20.0000 mg | Freq: Two times a day (BID) | INTRAMUSCULAR | Status: DC
  Administered 2022-03-11 – 2022-03-13 (×5): 20 mg via INTRAVENOUS
  Filled 2022-03-11 (×5): qty 4

## 2022-03-11 MED ORDER — METOPROLOL SUCCINATE ER 25 MG PO TB24
25.0000 mg | ORAL_TABLET | Freq: Every day | ORAL | Status: DC
  Administered 2022-03-11 – 2022-03-17 (×7): 25 mg via ORAL
  Filled 2022-03-11 (×7): qty 1

## 2022-03-11 MED ORDER — PANTOPRAZOLE SODIUM 40 MG PO TBEC
40.0000 mg | DELAYED_RELEASE_TABLET | Freq: Every day | ORAL | Status: DC
Start: 2022-03-11 — End: 2022-03-17
  Administered 2022-03-11 – 2022-03-17 (×7): 40 mg via ORAL
  Filled 2022-03-11 (×8): qty 1

## 2022-03-11 MED ORDER — POTASSIUM CHLORIDE CRYS ER 20 MEQ PO TBCR
40.0000 meq | EXTENDED_RELEASE_TABLET | Freq: Every day | ORAL | Status: AC
  Administered 2022-03-11: 40 meq via ORAL
  Filled 2022-03-11: qty 2

## 2022-03-11 NOTE — Progress Notes (Signed)
Pt hailed chaplain from hallway. She requested water; chaplain offered emotional support and prayer, which pt welcomed.  Pt was somewhat altered.  Chaplaincy services are available for follow-up upon request.   Theodoro Parma Pager: [phone]    03/11/22 2100  Clinical Encounter Type  Visited With Patient and family together  Visit Type Initial  Referral From Patient  Consult/Referral To Chaplain  Spiritual Encounters  Spiritual Needs Prayer  Stress Factors  Patient Stress Factors Lack of knowledge

## 2022-03-11 NOTE — Progress Notes (Signed)
RT placed pt on cpap after pt request but pt then wanted to be removed to drink and eat.  RT spoke with RN who stated he would place pt on machine when she is ready.  RT will cont to monitor.

## 2022-03-11 NOTE — Progress Notes (Signed)
PROGRESS NOTE    Tamara Russell  WUJ:[id number] DOB: 1961-09-15 DOA: 02/06/2022 PCP: [name], MD  Patient is a 61 y.o.  female morbid obesity, HTN, GERD-who was just discharged from this facility on 4/23-presented to the hospital on 4/24 with altered mental status.  Found to have metabolic encephalopathy.   Significant events: 4/19-4/23>> hospitalization for chest pain, confusion - UTI. 4/24 Presented to the ED for confusion-admit to [facility]. 4/28 Neuro eval - marital stress possibly causing AMS - psych consult recommended 5/01 Episode of aspiration - bradycardia/worsening hypoxemia - improved after suctioning.CXR with bibasilar opacities-IV Ancef started. 5/04 Neuro re-eval per psych rec's-?autoimmune process causing AMS 5/05 Trial of Depakote per neurology. 5/06 Significant sedation / lethargy following Haldol/Ativan -concern for hypercarbia -BiPAP started. LP postponed. Haldol/Ativan dosage adjusted by psychiatry. 5/07 Intubated, eeg neg neuro consulted.  5/08 LP completed. Cortrak placed. 5/09 Extubated, continues to have agitation, delirium with psychosis, and then episodes of minimal responsiveness 5/12 Intubated in setting SBO and suspected aspiration 5/15 Extubated, respiratory status improving and mental status clearing 5/19 Started on IVIG per neurology for empiric autoimmune encephalitis. 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  Multiple EEGs without seizure activity LP obtained without evidence of pleocytosis; autoimmune encephalopathy panel obtained and is pending. Neurology recommended IVIG for empiric treatment of autoimmune encephalitis after infectious work-up was ruled  out -Psychiatry recommended no changes to current medications; per their assessment, patient does not have capacity to make medical decisions. Metabolic work-up including TSH and free T4, cortisol level, ammonia, ethanol level, RPR, vitamin B1, vitamin B12, blood lead level, Depakote level not revealing. -I think hypercarbia is contributing, ABG with CO2 in the 60s 5/24, restarted on CPAP, fortunately he was able to tolerate this last few nights, continue melatonin for sleep and Requip for restless legs, I suspect a psychogenic component as well -Completed 5-day course of IVIG 5/23 -Continue thiamine  -Delirium precautions, blinds open, up in the chair, during the daytime -Plan for SNF early next week if stable, TOC following, discharge planning   Acute on chronic diastolic CHF (congestive heart failure) Elevated troponin -Is been on as needed Lasix this admission -Clinically appears mildly volume overloaded, add IV Lasix for 1 to 2 days   Left distal DVT/acute -Found to have left distal tibial peroneal vein DVT this admission -Continue Eliquis   UTI (urinary tract infection) Urine culture grew E. coli, Enterococcus and Citrobacter not in a significant amount. Treated with fosfomycin earlier during admission.  Later actually completed 7 days of antibiotics for aspiration pneumonia. -Resolved   Aspiration pneumonia Dysphagia Leukocytosis Multiple episodes of aspiration this admission,Klebsiella isolated on ET aspirate. Completed antibiotic course for 7 days. Patient had a cortrak for tube feeding which was removed as patient was able to pass swallowing evaluation with speech therapy. -Monitor clinically   Ileus (HCC) Small bowel obstruction, has been ruled out. -Seen by general surgery earlier this admission Patient's symptoms appear to have resolved. Having bowel movements.   Acute on chronic respiratory failure with hypoxia and hypercapnia  Per family, oxygen is a new  requirement from previous admission.  Hypercarbia mostly in the setting of obesity/OSA and sedation from medications. Patient was intubated twice this admission secondary to aspiration issues. Now on supplemental oxygen via nasal canula.   Obesity,  Class III, BMI 40-49.9 (morbid obesity) (HCC) OSA (obstructive sleep apnea) BMI is 42, was unable to tolerate BiPAP in the past, continue CPAP   Peptic ulcer disease GERD. Continuing PPI.  Hypertension blood pressure stable.  Hypokalemia, Hypernatremia Resolved.  Microcytic anemia, No bleeding.  Hemoglobin stable.   DVT prophylaxis: Apixaban Code Status: Full code Family Communication: Spouse at bedside Disposition Plan: Likely SNF when mental status is more stable  Consultants: Neurology, psychiatry   Procedures:   Antimicrobials:    Objective: Vitals:   03/11/22 0400 03/11/22 0527 03/11/22 0600 03/11/22 0800  BP:  128/82    Pulse: 82  98 83  Resp: (!) 25  (!) 23 18  Temp:      TempSrc:      SpO2: 96%  96% 98%  Weight:      Height:        Intake/Output Summary (Last 24 hours) at 03/11/2022 1102 Last data filed at 03/11/2022 1000 Gross per 24 hour  Intake 480 ml  Output 1525 ml  Net -1045 ml   Filed Weights   02/27/22 0600 03/05/22 1800 03/09/22 0354  Weight: 101 kg 101 kg 101.4 kg    Examination:  General exam: Pleasant chronically ill female sitting up in bed, eyes open, follows commands, oriented to self and partly to place, slow response  CVS: S1-S2, regular rhythm Lungs: Decreased breath sounds at the bases, improving air movement Abdomen: Soft, obese, nontender, bowel sounds present Extremities: No edema  Neuro: Bilateral lower extremity weakness, poor effort Skin: No rashes Psychiatry: Flat affect    Data Reviewed:   CBC: Recent Labs  Lab 03/05/22 0430 03/06/22 0300 03/08/22 0515 03/09/22 0145 03/11/22 0557  WBC 7.6 6.9 5.4 6.3 5.8  HGB 9.7* 9.9* 9.6* 9.4* 9.2*  HCT 32.7* 32.3* 32.2*  31.0* 30.8*  MCV 79.4* 79.0* 80.1 79.3* 78.8*  PLT 308 299 263 225 187   Basic Metabolic Panel: Recent Labs  Lab 03/05/22 0430 03/08/22 0515 03/09/22 0145 03/10/22 0525 03/11/22 0557  NA 135 134* 135 132* 134*  K 3.7 3.9 3.7 3.3* 3.8  CL 95* 92* 93* 92* 94*  CO2 37* 39* 37* 36* 35*  GLUCOSE 105* 88 102* 101* 92  BUN 12 <5* 7* 5* 8  CREATININE 0.34* 0.36* 0.37* 0.41* 0.40*  CALCIUM 8.8* 8.7* 8.6* 8.5* 8.4*  MG 1.7  --   --   --   --    GFR: Estimated Creatinine Clearance: 78.6 mL/min (A) (by C-G formula based on SCr of 0.4 mg/dL (L)). Liver Function Tests: Recent Labs  Lab 03/08/22 0515 03/09/22 0145  AST 21 23  ALT 12 12  ALKPHOS 46 49  BILITOT 0.2* 0.5  PROT 7.5 7.1  ALBUMIN 1.9* 1.9*   No results for input(s): LIPASE, AMYLASE in the last 168 hours. Recent Labs  Lab 03/09/22 1506  AMMONIA 24   Coagulation Profile: No results for input(s): INR, PROTIME in the last 168 hours. Cardiac Enzymes: No results for input(s): CKTOTAL, CKMB, CKMBINDEX, TROPONINI in the last 168 hours. BNP (last 3 results) No results for input(s): PROBNP in the last 8760 hours. HbA1C: No results for input(s): HGBA1C in the last 72 hours. CBG: Recent Labs  Lab 03/08/22 1523 03/10/22 0712 03/10/22 1108 03/10/22 1528 03/11/22 0732  GLUCAP 141* 99 105* 123* 102*   Lipid Profile: No results for input(s): CHOL, HDL, LDLCALC, TRIG, CHOLHDL, LDLDIRECT in the last 72 hours. Thyroid Function Tests: No results for input(s): TSH, T4TOTAL, FREET4, T3FREE, THYROIDAB  in the last 72 hours. Anemia Panel: No results for input(s): VITAMINB12, FOLATE, FERRITIN, TIBC, IRON, RETICCTPCT in the last 72 hours. Urine analysis:    Component Value Date/Time   COLORURINE YELLOW 02/06/2022 1425   APPEARANCEUR CLEAR 02/06/2022 1425   LABSPEC 1.023 02/06/2022 1425   PHURINE 7.0 02/06/2022 1425   GLUCOSEU NEGATIVE 02/06/2022 1425   HGBUR NEGATIVE 02/06/2022 1425   BILIRUBINUR NEGATIVE 02/06/2022 1425    KETONESUR 80 (A) 02/06/2022 1425   PROTEINUR NEGATIVE 02/06/2022 1425   NITRITE NEGATIVE 02/06/2022 1425   LEUKOCYTESUR NEGATIVE 02/06/2022 1425   Sepsis Labs: @LABRCNTIP (procalcitonin:4,lacticidven:4)  )No results found for this or any previous visit (from the past 240 hour(s)).   Radiology Studies: DG CHEST PORT 1 VIEW  Result Date: 03/10/2022 CLINICAL DATA:  Shortness of breath, low O2 sats EXAM: PORTABLE CHEST 1 VIEW COMPARISON:  03/02/2022 FINDINGS: Right PICC line tip at the cavoatrial junction, unchanged. Heart is borderline in size. Vascular congestion and bibasilar atelectasis. No effusions or acute bony abnormality. IMPRESSION: Mild vascular congestion, bibasilar atelectasis. Electronically Signed   By: Charlett NoseKevin  Dover M.D.   On: 03/10/2022 19:10     Scheduled Meds:  apixaban  10 mg Oral BID   Followed by   Melene Muller[START ON 03/13/2022] apixaban  5 mg Oral BID   aspirin EC  81 mg Oral Daily   atorvastatin  40 mg Oral QHS   chlorhexidine  15 mL Mouth Rinse BID   Chlorhexidine Gluconate Cloth  6 each Topical Daily   feeding supplement  237 mL Oral BID BM   furosemide  20 mg Intravenous BID   mouth rinse  15 mL Mouth Rinse BID   melatonin  5 mg Oral QPM   metoprolol succinate  25 mg Oral Daily   multivitamin with minerals  1 tablet Oral Daily   potassium chloride  20 mEq Oral Daily   rOPINIRole  0.25 mg Oral QPM   sodium chloride flush  10-40 mL Intracatheter Q12H   sodium chloride flush  3 mL Intravenous Q12H   thiamine  100 mg Oral Daily   Continuous Infusions:  sodium chloride Stopped (02/27/22 1532)     LOS: 32 days    Time spent: 25min  Zannie CovePreetha Kacy Conely, MD [facility]   03/11/2022, 11:02 AM

## 2022-03-12 DIAGNOSIS — G9341 Metabolic encephalopathy: Secondary | ICD-10-CM | POA: Diagnosis not present

## 2022-03-12 LAB — BASIC METABOLIC PANEL
Anion gap: 8 (ref 5–15)
BUN: 7 mg/dL — ABNORMAL LOW (ref 8–23)
CO2: 34 mmol/L — ABNORMAL HIGH (ref 22–32)
Calcium: 8.8 mg/dL — ABNORMAL LOW (ref 8.9–10.3)
Chloride: 92 mmol/L — ABNORMAL LOW (ref 98–111)
Creatinine, Ser: 0.43 mg/dL — ABNORMAL LOW (ref 0.44–1.00)
GFR, Estimated: >60 mL/min (ref >60)
Glucose, Bld: 103 mg/dL — ABNORMAL HIGH (ref 70–99)
Potassium: 3.9 mmol/L (ref 3.5–5.1)
Sodium: 134 mmol/L — ABNORMAL LOW (ref 135–145)

## 2022-03-12 MED ORDER — POTASSIUM CHLORIDE CRYS ER 20 MEQ PO TBCR
20.0000 meq | EXTENDED_RELEASE_TABLET | Freq: Two times a day (BID) | ORAL | Status: DC
  Administered 2022-03-12 – 2022-03-13 (×3): 20 meq via ORAL
  Filled 2022-03-12 (×3): qty 1

## 2022-03-12 MED ORDER — SENNOSIDES-DOCUSATE SODIUM 8.6-50 MG PO TABS
1.0000 | ORAL_TABLET | Freq: Two times a day (BID) | ORAL | Status: DC
  Administered 2022-03-12 – 2022-03-17 (×10): 1 via ORAL
  Filled 2022-03-12 (×10): qty 1

## 2022-03-12 MED ORDER — BISACODYL 5 MG PO TBEC
10.0000 mg | DELAYED_RELEASE_TABLET | Freq: Every day | ORAL | Status: DC | PRN
  Administered 2022-03-12: 10 mg via ORAL
  Filled 2022-03-12: qty 2

## 2022-03-12 NOTE — Progress Notes (Signed)
PROGRESS NOTE    Tamara Russell  RJJ:[id number] DOB: 20-Mar-1961 DOA: 02/06/2022 PCP: [name], MD  Patient is a 61 y.o.  female morbid obesity, HTN, GERD-who was just discharged from this facility on 4/23-presented to the hospital on 4/24 with altered mental status.  Found to have metabolic encephalopathy.   Significant events: 4/19-4/23>> hospitalization for chest pain, confusion - UTI. 4/24 Presented to the ED for confusion-admit to [facility]. 4/28 Neuro eval - marital stress possibly causing AMS - psych consult recommended 5/01 Episode of aspiration - bradycardia/worsening hypoxemia - improved after suctioning.CXR with bibasilar opacities-IV Ancef started. 5/04 Neuro re-eval per psych rec's-?autoimmune process causing AMS 5/05 Trial of Depakote per neurology. 5/06 Significant sedation / lethargy following Haldol/Ativan -concern for hypercarbia -BiPAP started. LP postponed. Haldol/Ativan dosage adjusted by psychiatry. 5/07 Intubated, eeg neg neuro consulted.  5/08 LP completed. Cortrak placed. 5/09 Extubated, continues to have agitation, delirium with psychosis, and then episodes of minimal responsiveness 5/12 Intubated in setting SBO and suspected aspiration 5/15 Extubated, respiratory status improving and mental status clearing 5/19 Started on IVIG per neurology for empiric autoimmune encephalitis. 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  Multiple EEGs without seizure activity LP obtained without evidence of pleocytosis; autoimmune encephalopathy panel obtained and is pending. Neurology recommended IVIG for empiric treatment of autoimmune encephalitis after  infectious work-up was ruled out -Psychiatry recommended no changes to current medications; per their assessment, patient does not have capacity to make medical decisions. Metabolic work-up including TSH and free T4, cortisol level, ammonia, ethanol level, RPR, vitamin B1, vitamin B12, blood lead level, Depakote level not revealing. -I think hypercarbia is contributing, ABG with CO2 in the 60s 5/24, restarted on CPAP, fortunately he was able to tolerate this last few nights, continue melatonin for sleep and Requip for restless legs, I suspect a psychogenic component as well -Completed 5-day course of IVIG 5/23 -Continue thiamine  -Continue delirium precautions -Discharge planning, SNF versus CIR   Acute on chronic diastolic CHF (congestive heart failure) Elevated troponin -Is been on as needed Lasix this admission -Clinically appears mildly volume overloaded, continue IV Lasix today   Left distal DVT/acute -Found to have left distal tibial peroneal vein DVT this admission -Continue Eliquis   UTI (urinary tract infection) Urine culture grew E. coli, Enterococcus and Citrobacter not in a significant amount. Treated with fosfomycin earlier during admission.  Later actually completed 7 days of antibiotics for aspiration pneumonia. -Resolved   Aspiration pneumonia Dysphagia Leukocytosis Multiple episodes of aspiration this admission,Klebsiella isolated on ET aspirate. Completed antibiotic course for 7 days. Patient had a cortrak for tube feeding which was removed as patient was able to pass swallowing evaluation with speech therapy. -Monitor clinically   Ileus (HCC) Small bowel obstruction, has been ruled out. -Seen by general surgery earlier this admission Patient's symptoms appear to have resolved. Having bowel movements.   Acute on chronic respiratory failure with hypoxia and hypercapnia  Per family, oxygen is a new requirement from previous admission.  Hypercarbia mostly in the  setting of obesity/OSA and sedation from medications. Patient was intubated twice this admission secondary to aspiration issues. Now on supplemental oxygen via nasal canula.   Obesity, Class III, BMI 40-49.9 (morbid obesity) (HCC) OSA (obstructive sleep apnea) BMI is 42,  was unable to tolerate BiPAP in the past, continue CPAP   Peptic ulcer disease GERD. Continuing PPI.  Hypertension blood pressure stable.  Hypokalemia, Hypernatremia Resolved.  Microcytic anemia, No bleeding.  Hemoglobin stable.   DVT prophylaxis: Apixaban Code Status: Full code Family Communication:  daughter at bedside Disposition Plan: SNF versus CIR in 48 hours  Consultants: Neurology, psychiatry   Procedures:   Antimicrobials:    Objective: Vitals:   03/11/22 2254 03/12/22 0100 03/12/22 0333 03/12/22 0719  BP: 140/84  134/84 130/81  Pulse: 88 93 86 86  Resp: (!) 24   18  Temp: 99.3 F (37.4 C)  98.9 F (37.2 C) 97.6 F (36.4 C)  TempSrc: Axillary  Axillary Axillary  SpO2: 95% 96% 96% 94%  Weight:      Height:        Intake/Output Summary (Last 24 hours) at 03/12/2022 1021 Last data filed at 03/11/2022 2257 Gross per 24 hour  Intake 1330 ml  Output 750 ml  Net 580 ml   Filed Weights   02/27/22 0600 03/05/22 1800 03/09/22 0354  Weight: 101 kg 101 kg 101.4 kg    Examination:  General exam: Pleasant chronically ill female sitting up in bed eating breakfast, awake alert oriented to self, place and partly to time more appropriate this morning CVS: S1-S2, regular rhythm Lungs: Decreased breath sounds to bases otherwise clear Abdomen: Soft, nontender, bowel sounds present Extremities: Trace edema  Neuro: Bilateral lower extremity weakness, poor effort Skin: No rashes Psychiatry: Flat affect    Data Reviewed:   CBC: Recent Labs  Lab 03/06/22 0300 03/08/22 0515 03/09/22 0145 03/11/22 0557  WBC 6.9 5.4 6.3 5.8  HGB 9.9* 9.6* 9.4* 9.2*  HCT 32.3* 32.2* 31.0* 30.8*  MCV 79.0*  80.1 79.3* 78.8*  PLT 299 263 225 187   Basic Metabolic Panel: Recent Labs  Lab 03/08/22 0515 03/09/22 0145 03/10/22 0525 03/11/22 0557 03/12/22 0524  NA 134* 135 132* 134* 134*  K 3.9 3.7 3.3* 3.8 3.9  CL 92* 93* 92* 94* 92*  CO2 39* 37* 36* 35* 34*  GLUCOSE 88 102* 101* 92 103*  BUN <5* 7* 5* 8 7*  CREATININE 0.36* 0.37* 0.41* 0.40* 0.43*  CALCIUM 8.7* 8.6* 8.5* 8.4* 8.8*   GFR: Estimated Creatinine Clearance: 78.6 mL/min (A) (by C-G formula based on SCr of 0.43 mg/dL (L)). Liver Function Tests: Recent Labs  Lab 03/08/22 0515 03/09/22 0145  AST 21 23  ALT 12 12  ALKPHOS 46 49  BILITOT 0.2* 0.5  PROT 7.5 7.1  ALBUMIN 1.9* 1.9*   No results for input(s): LIPASE, AMYLASE in the last 168 hours. Recent Labs  Lab 03/09/22 1506  AMMONIA 24   Coagulation Profile: No results for input(s): INR, PROTIME in the last 168 hours. Cardiac Enzymes: No results for input(s): CKTOTAL, CKMB, CKMBINDEX, TROPONINI in the last 168 hours. BNP (last 3 results) No results for input(s): PROBNP in the last 8760 hours. HbA1C: No results for input(s): HGBA1C in the last 72 hours. CBG: Recent Labs  Lab 03/08/22 1523 03/10/22 0712 03/10/22 1108 03/10/22 1528 03/11/22 0732  GLUCAP 141* 99 105* 123* 102*   Lipid Profile: No results for input(s): CHOL, HDL, LDLCALC, TRIG, CHOLHDL, LDLDIRECT in the last 72 hours. Thyroid Function Tests: No results for input(s): TSH, T4TOTAL, FREET4, T3FREE, THYROIDAB in the last 72 hours. Anemia Panel: No results for input(s): VITAMINB12, FOLATE, FERRITIN, TIBC, IRON, RETICCTPCT in the last 72 hours. Urine analysis:    Component Value Date/Time  COLORURINE YELLOW 02/06/2022 1425   APPEARANCEUR CLEAR 02/06/2022 1425   LABSPEC 1.023 02/06/2022 1425   PHURINE 7.0 02/06/2022 1425   GLUCOSEU NEGATIVE 02/06/2022 1425   HGBUR NEGATIVE 02/06/2022 1425   BILIRUBINUR NEGATIVE 02/06/2022 1425   KETONESUR 80 (A) 02/06/2022 1425   PROTEINUR NEGATIVE  02/06/2022 1425   NITRITE NEGATIVE 02/06/2022 1425   LEUKOCYTESUR NEGATIVE 02/06/2022 1425   Sepsis Labs: @LABRCNTIP (procalcitonin:4,lacticidven:4)  )No results found for this or any previous visit (from the past 240 hour(s)).   Radiology Studies: DG CHEST PORT 1 VIEW  Result Date: 03/10/2022 CLINICAL DATA:  Shortness of breath, low O2 sats EXAM: PORTABLE CHEST 1 VIEW COMPARISON:  03/02/2022 FINDINGS: Right PICC line tip at the cavoatrial junction, unchanged. Heart is borderline in size. Vascular congestion and bibasilar atelectasis. No effusions or acute bony abnormality. IMPRESSION: Mild vascular congestion, bibasilar atelectasis. Electronically Signed   By: 03/04/2022 M.D.   On: 03/10/2022 19:10     Scheduled Meds:  apixaban  10 mg Oral BID   Followed by   03/12/2022 ON 03/13/2022] apixaban  5 mg Oral BID   aspirin EC  81 mg Oral Daily   atorvastatin  40 mg Oral QHS   chlorhexidine  15 mL Mouth Rinse BID   Chlorhexidine Gluconate Cloth  6 each Topical Daily   feeding supplement  237 mL Oral BID BM   furosemide  20 mg Intravenous BID   mouth rinse  15 mL Mouth Rinse BID   melatonin  5 mg Oral QPM   metoprolol succinate  25 mg Oral Daily   multivitamin with minerals  1 tablet Oral Daily   pantoprazole  40 mg Oral Q1200   potassium chloride  20 mEq Oral BID   rOPINIRole  0.25 mg Oral QPM   sodium chloride flush  10-40 mL Intracatheter Q12H   thiamine  100 mg Oral Daily   Continuous Infusions:  sodium chloride Stopped (02/27/22 1532)     LOS: 33 days    Time spent: 03/01/22  , MD [facility]   03/12/2022, 10:21 AM

## 2022-03-13 DIAGNOSIS — G9341 Metabolic encephalopathy: Secondary | ICD-10-CM | POA: Diagnosis not present

## 2022-03-13 LAB — BASIC METABOLIC PANEL
Anion gap: 7 (ref 5–15)
BUN: 7 mg/dL — ABNORMAL LOW (ref 8–23)
CO2: 34 mmol/L — ABNORMAL HIGH (ref 22–32)
Calcium: 8.9 mg/dL (ref 8.9–10.3)
Chloride: 88 mmol/L — ABNORMAL LOW (ref 98–111)
Creatinine, Ser: 0.41 mg/dL — ABNORMAL LOW (ref 0.44–1.00)
GFR, Estimated: >60 mL/min (ref >60)
Glucose, Bld: 98 mg/dL (ref 70–99)
Potassium: 3.8 mmol/L (ref 3.5–5.1)
Sodium: 129 mmol/L — ABNORMAL LOW (ref 135–145)

## 2022-03-13 LAB — CBC
HCT: 32.3 % — ABNORMAL LOW (ref 36.0–46.0)
Hemoglobin: 9.8 g/dL — ABNORMAL LOW (ref 12.0–15.0)
MCH: 23.7 pg — ABNORMAL LOW (ref 26.0–34.0)
MCHC: 30.3 g/dL (ref 30.0–36.0)
MCV: 78 fL — ABNORMAL LOW (ref 80.0–100.0)
Platelets: 198 10*3/uL (ref 150–400)
RBC: 4.14 MIL/uL (ref 3.87–5.11)
RDW: 18.8 % — ABNORMAL HIGH (ref 11.5–15.5)
WBC: 6.4 10*3/uL (ref 4.0–10.5)
nRBC: 0 % (ref 0.0–0.2)

## 2022-03-13 MED ORDER — LACTULOSE 10 GM/15ML PO SOLN
20.0000 g | Freq: Two times a day (BID) | ORAL | Status: AC
  Administered 2022-03-13: 20 g via ORAL
  Filled 2022-03-13: qty 30

## 2022-03-13 MED ORDER — BISACODYL 5 MG PO TBEC
10.0000 mg | DELAYED_RELEASE_TABLET | Freq: Once | ORAL | Status: AC
  Administered 2022-03-13: 10 mg via ORAL
  Filled 2022-03-13: qty 2

## 2022-03-13 MED ORDER — SENNA 8.6 MG PO TABS
1.0000 | ORAL_TABLET | Freq: Once | ORAL | Status: AC
  Administered 2022-03-13: 8.6 mg via ORAL
  Filled 2022-03-13: qty 1

## 2022-03-13 NOTE — TOC Progression Note (Signed)
Transition of Care (TOC) - Progression Note    Patient Details  Name: Tamara Russell MRN: [id number] Date of Birth: 09/26/1961  Transition of Care (TOC) CM/SW Contact    , LCSWA Phone Number: 03/13/2022, 1:05 PM  Clinical Narrative:    CSW met with pt and dtr at bedside. Pt still displaying confusion at this time, questions directed to dtr. CSW asked if bed offers had been reviewed. Dtr noted they were still wanting to view facilities, and had not chosen one. Dtr noted that pt's spouse is considering them and CSW could reach out to him. CSW informed pt is getting ready for DC and will need bed choice soon. CSW called both numbers for spouse, unable to leave a VM on home phone, work phone number noted CSW had the wrong number. TOC will continue to follow for final choice and DC needs.   Expected Discharge Plan: Skilled Nursing Facility Barriers to Discharge: Continued Medical Work up, Insurance Authorization  Expected Discharge Plan and Services Expected Discharge Plan: Skilled Nursing Facility   Discharge Planning Services: CM Consult                                           Social Determinants of Health (SDOH) Interventions    Readmission Risk Interventions     View : No data to display.          

## 2022-03-13 NOTE — Progress Notes (Signed)
PT Cancellation Note  Patient Details Name: Tamara Russell MRN: [id number] DOB: 1961-07-12   Cancelled Treatment:    Reason Eval/Treat Not Completed: Other (comment) (Had to abort session due to copious stool).  Will check back 5/30 as able in the am. 03/13/2022  Jacinto Halim., PT Acute Rehabilitation Services [phone]  (pager) [phone]  (office)   Eliseo Gum [name] 03/13/2022, 5:22 PM

## 2022-03-13 NOTE — Progress Notes (Addendum)
PROGRESS NOTE    Tamara Russell  AOZ:[id number] DOB: [birth year] DOA: 02/06/2022 PCP: [name], MD  Patient is a 61 y.o.  female morbid obesity, HTN, GERD-who was just discharged from this facility on 4/23-presented to the hospital on 4/24 with altered mental status.  Found to have metabolic encephalopathy.   Significant events: 4/19-4/23>> hospitalization for chest pain, confusion - UTI. 4/24 Presented to the ED for confusion-admit to [facility] 4/28 Neuro eval - marital stress possibly causing AMS - psych consult recommended 5/01 Episode of aspiration - bradycardia/worsening hypoxemia - improved after suctioning.CXR with bibasilar opacities-IV Ancef started. 5/04 Neuro re-eval per psych rec's-?autoimmune process causing AMS 5/05 Trial of Depakote per neurology. 5/06 Significant sedation / lethargy following Haldol/Ativan -concern for hypercarbia -BiPAP started. LP postponed. Haldol/Ativan dosage adjusted by psychiatry. 5/07 Intubated, eeg neg neuro consulted.  5/08 LP completed. Cortrak placed. 5/09 Extubated, continues to have agitation, delirium with psychosis, and then episodes of minimal responsiveness 5/12 Intubated in setting SBO and suspected aspiration 5/15 Extubated, respiratory status improving and mental status clearing 5/19 Started on IVIG per neurology for empiric autoimmune encephalitis. 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  Multiple EEGs without seizure activity LP obtained without evidence of pleocytosis; autoimmune encephalopathy panel obtained and is pending. Neurology recommended IVIG  for empiric treatment of autoimmune encephalitis after infectious work-up was ruled out -Psychiatry recommended no changes to current medications; per their assessment, patient does not have capacity to make medical decisions. Metabolic work-up including TSH and free T4, cortisol level, ammonia, ethanol level, RPR, vitamin B1, vitamin B12, blood lead level, Depakote level not revealing. -I think hypercarbia is contributing, ABG with CO2 in the 60s 5/24, restarted on CPAP, fortunately he was able to tolerate this last few nights, continue melatonin for sleep and Requip for restless legs, I suspect a psychogenic component as well -Completed 5-day course of IVIG 5/23 -Continue delirium precautions, discharge planning, will need SNF in 48 hours if stable   Acute on chronic diastolic CHF (congestive heart failure) Elevated troponin -Is been on as needed Lasix this admission -Diuresed with IV Lasix X 2 days, 3 L negative, sodium trending down, hold diuretics today   Left distal DVT/acute Right leg pain -Found to have left distal tibial peroneal vein DVT this admission -Continue Eliquis -Supportive care  Constipation -Continue current laxatives, add lactulose   UTI (urinary tract infection) Urine culture grew E. coli, Enterococcus and Citrobacter not in a significant amount. Treated with fosfomycin earlier during admission.  Later actually completed 7 days of antibiotics for aspiration pneumonia. -Resolved   Aspiration pneumonia Dysphagia Leukocytosis Multiple episodes of aspiration this admission,Klebsiella isolated on ET aspirate. Completed antibiotic course for 7 days. Patient had a cortrak for tube feeding which was removed as patient was able to pass swallowing evaluation with speech therapy. -Monitor clinically   Ileus (HCC) Small bowel obstruction, has been ruled out. -Seen by general surgery earlier this admission Patient's symptoms appear to have resolved. Having bowel  movements.   Acute on chronic respiratory failure with hypoxia and hypercapnia  Per family, oxygen is a new requirement from previous admission.  Hypercarbia mostly in the setting of obesity/OSA and sedation from medications. Patient was intubated twice this  admission secondary to aspiration issues. Now on supplemental oxygen via nasal canula.   Obesity, Class III, BMI 40-49.9 (morbid obesity) (HCC) OSA (obstructive sleep apnea) BMI is 42, was unable to tolerate BiPAP in the past, continue CPAP   Peptic ulcer disease GERD. Continuing PPI.  Hypertension blood pressure stable.  Hypokalemia, Hypernatremia Resolved.  Microcytic anemia, No bleeding.  Hemoglobin stable.   DVT prophylaxis: Apixaban Code Status: Full code Family Communication:  daughter at bedside Disposition Plan: SNF versus CIR in 48 hours  Consultants: Neurology, psychiatry   Procedures:   Antimicrobials:    Objective: Vitals:   03/12/22 2138 03/12/22 2320 03/13/22 0330 03/13/22 0722  BP:  139/90 134/86 (!) 143/79  Pulse: 74 93 90 74  Resp: (!) 22 (!) 24 14 (!) 22  Temp:  97.8 F (36.6 C) 98 F (36.7 C) 98.4 F (36.9 C)  TempSrc:  Oral Oral Oral  SpO2: 97% 93% 95% 98%  Weight:      Height:        Intake/Output Summary (Last 24 hours) at 03/13/2022 1004 Last data filed at 03/13/2022 0932 Gross per 24 hour  Intake 720 ml  Output 1900 ml  Net -1180 ml   Filed Weights   02/27/22 0600 03/05/22 1800 03/09/22 0354  Weight: 101 kg 101 kg 101.4 kg    Examination:  General exam: Pleasant chronically ill female sitting up in bed, eating breakfast, awake alert oriented to self and place, partly to time, waxing and waning CVS: S1-S2, regular rhythm Lungs: Improving air movement, decreased breath sounds to bases Abdomen: Soft, obese, nontender, bowel sounds present Extremities: Trace edema  Neuro: Bilateral lower extremity weakness, poor effort Skin: No rashes Psychiatry: Flat affect    Data  Reviewed:   CBC: Recent Labs  Lab 03/08/22 0515 03/09/22 0145 03/11/22 0557 03/13/22 0600  WBC 5.4 6.3 5.8 6.4  HGB 9.6* 9.4* 9.2* 9.8*  HCT 32.2* 31.0* 30.8* 32.3*  MCV 80.1 79.3* 78.8* 78.0*  PLT 263 225 187 198   Basic Metabolic Panel: Recent Labs  Lab 03/09/22 0145 03/10/22 0525 03/11/22 0557 03/12/22 0524 03/13/22 0600  NA 135 132* 134* 134* 129*  K 3.7 3.3* 3.8 3.9 3.8  CL 93* 92* 94* 92* 88*  CO2 37* 36* 35* 34* 34*  GLUCOSE 102* 101* 92 103* 98  BUN 7* 5* 8 7* 7*  CREATININE 0.37* 0.41* 0.40* 0.43* 0.41*  CALCIUM 8.6* 8.5* 8.4* 8.8* 8.9   GFR: Estimated Creatinine Clearance: 78.6 mL/min (A) (by C-G formula based on SCr of 0.41 mg/dL (L)). Liver Function Tests: Recent Labs  Lab 03/08/22 0515 03/09/22 0145  AST 21 23  ALT 12 12  ALKPHOS 46 49  BILITOT 0.2* 0.5  PROT 7.5 7.1  ALBUMIN 1.9* 1.9*   No results for input(s): LIPASE, AMYLASE in the last 168 hours. Recent Labs  Lab 03/09/22 1506  AMMONIA 24   Coagulation Profile: No results for input(s): INR, PROTIME in the last 168 hours. Cardiac Enzymes: No results for input(s): CKTOTAL, CKMB, CKMBINDEX, TROPONINI in the last 168 hours. BNP (last 3 results) No results for input(s): PROBNP in the last 8760 hours. HbA1C: No results for input(s): HGBA1C in the last 72 hours. CBG: Recent Labs  Lab 03/08/22 1523 03/10/22 0712 03/10/22 1108 03/10/22 1528 03/11/22 0732  GLUCAP 141* 99 105* 123* 102*   Lipid Profile: No results for input(s): CHOL, HDL, LDLCALC, TRIG, CHOLHDL, LDLDIRECT in the last 72 hours. Thyroid Function Tests: No results for input(s): TSH, T4TOTAL,  FREET4, T3FREE, THYROIDAB in the last 72 hours. Anemia Panel: No results for input(s): VITAMINB12, FOLATE, FERRITIN, TIBC, IRON, RETICCTPCT in the last 72 hours. Urine analysis:    Component Value Date/Time   COLORURINE YELLOW 02/06/2022 1425   APPEARANCEUR CLEAR 02/06/2022 1425   LABSPEC 1.023 02/06/2022 1425   PHURINE 7.0  02/06/2022 1425   GLUCOSEU NEGATIVE 02/06/2022 1425   HGBUR NEGATIVE 02/06/2022 1425   BILIRUBINUR NEGATIVE 02/06/2022 1425   KETONESUR 80 (A) 02/06/2022 1425   PROTEINUR NEGATIVE 02/06/2022 1425   NITRITE NEGATIVE 02/06/2022 1425   LEUKOCYTESUR NEGATIVE 02/06/2022 1425   Sepsis Labs: @LABRCNTIP (procalcitonin:4,lacticidven:4)  )No results found for this or any previous visit (from the past 240 hour(s)).   Radiology Studies: No results found.   Scheduled Meds:  apixaban  5 mg Oral BID   aspirin EC  81 mg Oral Daily   atorvastatin  40 mg Oral QHS   bisacodyl  10 mg Oral Once   chlorhexidine  15 mL Mouth Rinse BID   Chlorhexidine Gluconate Cloth  6 each Topical Daily   feeding supplement  237 mL Oral BID BM   lactulose  20 g Oral BID   mouth rinse  15 mL Mouth Rinse BID   melatonin  5 mg Oral QPM   metoprolol succinate  25 mg Oral Daily   multivitamin with minerals  1 tablet Oral Daily   pantoprazole  40 mg Oral Q1200   rOPINIRole  0.25 mg Oral QPM   senna-docusate  1 tablet Oral BID   sodium chloride flush  10-40 mL Intracatheter Q12H   thiamine  100 mg Oral Daily   Continuous Infusions:  sodium chloride Stopped (02/27/22 1532)     LOS: 34 days    Time spent: 25min  Zannie CovePreetha Selmer Adduci, MD [facility]   03/13/2022, 10:04 AM

## 2022-03-13 NOTE — Progress Notes (Signed)
Patient place on CPAP via FFM with 2 L O2 bleed in.  Patient tolerating at this time.

## 2022-03-14 DIAGNOSIS — G9341 Metabolic encephalopathy: Secondary | ICD-10-CM | POA: Diagnosis not present

## 2022-03-14 LAB — COMPREHENSIVE METABOLIC PANEL
ALT: 13 U/L (ref 0–44)
AST: 21 U/L (ref 15–41)
Albumin: 2 g/dL — ABNORMAL LOW (ref 3.5–5.0)
Alkaline Phosphatase: 50 U/L (ref 38–126)
Anion gap: 6 (ref 5–15)
BUN: 10 mg/dL (ref 8–23)
CO2: 34 mmol/L — ABNORMAL HIGH (ref 22–32)
Calcium: 8.8 mg/dL — ABNORMAL LOW (ref 8.9–10.3)
Chloride: 91 mmol/L — ABNORMAL LOW (ref 98–111)
Creatinine, Ser: 0.44 mg/dL (ref 0.44–1.00)
GFR, Estimated: >60 mL/min (ref >60)
Glucose, Bld: 96 mg/dL (ref 70–99)
Potassium: 3.8 mmol/L (ref 3.5–5.1)
Sodium: 131 mmol/L — ABNORMAL LOW (ref 135–145)
Total Bilirubin: 0.4 mg/dL (ref 0.3–1.2)
Total Protein: 6.4 g/dL — ABNORMAL LOW (ref 6.5–8.1)

## 2022-03-14 LAB — CBC
HCT: 30.6 % — ABNORMAL LOW (ref 36.0–46.0)
Hemoglobin: 9.3 g/dL — ABNORMAL LOW (ref 12.0–15.0)
MCH: 23.6 pg — ABNORMAL LOW (ref 26.0–34.0)
MCHC: 30.4 g/dL (ref 30.0–36.0)
MCV: 77.7 fL — ABNORMAL LOW (ref 80.0–100.0)
Platelets: 189 10*3/uL (ref 150–400)
RBC: 3.94 MIL/uL (ref 3.87–5.11)
RDW: 18.6 % — ABNORMAL HIGH (ref 11.5–15.5)
WBC: 7.4 10*3/uL (ref 4.0–10.5)
nRBC: 0 % (ref 0.0–0.2)

## 2022-03-14 MED ORDER — DICLOFENAC SODIUM 1 % EX GEL
2.0000 g | Freq: Three times a day (TID) | CUTANEOUS | Status: DC
  Administered 2022-03-14 – 2022-03-17 (×9): 2 g via TOPICAL
  Filled 2022-03-14: qty 100

## 2022-03-14 MED ORDER — TORSEMIDE 20 MG PO TABS
20.0000 mg | ORAL_TABLET | Freq: Every day | ORAL | Status: DC
  Administered 2022-03-14 – 2022-03-16 (×3): 20 mg via ORAL
  Filled 2022-03-14 (×3): qty 1

## 2022-03-14 MED ORDER — POTASSIUM CHLORIDE CRYS ER 20 MEQ PO TBCR
20.0000 meq | EXTENDED_RELEASE_TABLET | Freq: Every day | ORAL | Status: DC
  Administered 2022-03-14 – 2022-03-16 (×3): 20 meq via ORAL
  Filled 2022-03-14 (×3): qty 1

## 2022-03-14 NOTE — TOC Progression Note (Signed)
Transition of Care [facility]) - Progression Note    Patient Details  Name: Tamara Russell MRN: [id number] Date of Birth: [birth year]  Transition of Care [facility] CM/SW [place], [place] Phone Number: 03/14/2022, 3:38 PM  Clinical Narrative:   CSW met with patient's spouse and discussed with daughter on the phone about SNF bed offers. Family will review options available today and have choice for CSW tomorrow. CSW to follow.    Expected Discharge Plan: Skilled Nursing Facility Barriers to Discharge: Continued Medical Work up, Ship broker  Expected Discharge Plan and Services Expected Discharge Plan: [place]   Discharge Planning Services: CM Consult                                           Social Determinants of Health (SDOH) Interventions    Readmission Risk Interventions     View : No data to display.

## 2022-03-14 NOTE — Evaluation (Incomplete)
Physical Therapy Evaluation Patient Details Name: Leyana Havron MRN: [id number] DOB: [birth year] Today's Date: 03/14/2022  History of Present Illness  61 y.o. female who presents after being found altered this morning sitting on the porch. Patient had just recently been hospitalized from 4/19-4/23 after presenting for altered mental status. MRI negative; Psychiatry consult due to recent home stressors and pt with hallucinations; +delirium. Pt intubated 5/7-5/9, re-intubated 5/11-5/14 with concern for aspiration PNA and SBO. PMH significant of HTN, reformed smoker, recently hypoxic and started on home O2, and obesity.  Clinical Impression  ***       Recommendations for follow up therapy are one component of a multi-disciplinary discharge planning process, led by the attending physician.  Recommendations may be updated based on patient status, additional functional criteria and insurance authorization.  Follow Up Recommendations      Assistance Recommended at Discharge    Patient can return home with the following       Equipment Recommendations    Recommendations for Other Services       Functional Status Assessment       Precautions / Restrictions Precautions Precautions: Fall Precaution Comments: delirium Restrictions Weight Bearing Restrictions: No      Mobility  Bed Mobility Overal bed mobility: Needs Assistance Bed Mobility: Supine to Sit     Supine to sit: Mod assist, +2 for physical assistance, HOB elevated     General bed mobility comments: pt was able to sustain attention on task to sit up and scoot toward the EOB.  lost focus with distractions, but was able to be directed and follow simple direction.  truncal/stability assist.    Transfers Overall transfer level: Needs assistance Equipment used: Ambulation equipment used (STEDY) Transfers: Sit to/from Stand Sit to Stand: Max assist, Total assist, +2 physical assistance, +2 safety/equipment, From  elevated surface           General transfer comment: When fully aware and "in the moment", pt was able to follow maximal cues and facilitation to work on standing x4, 2 from bed level and 2 from STEDY level.  In between stands, pt consistently entered this semi-catatonic state (for lack of a better term) where she would grunt affirmation to "Caileen".  At these time, pt is unable to follow commands or mobilie and is total assist.    Ambulation/Gait               General Gait Details: Unable.  Stairs            Wheelchair Mobility    Modified Rankin (Stroke Patients Only)       Balance Overall balance assessment: Needs assistance Sitting-balance support: Feet supported, Bilateral upper extremity supported Sitting balance-Leahy Scale: Poor Sitting balance - Comments: can balance with UE assist when mentally aware otherwise needs truncal support.     Standing balance-Leahy Scale: Zero Standing balance comment: stood x 4 in steady.  Attempted only when pt was aware and able to follow commands--about 50% of time when not in semi-catatonic state.                             Pertinent Vitals/Pain Pain Assessment Pain Assessment: Faces Faces Pain Scale: Hurts a little bit Pain Location: general R knee arthritis Pain Descriptors / Indicators: Discomfort, Grimacing Pain Intervention(s): Monitored during session, Limited activity within patient's tolerance    Home Living  Prior Function                       Hand Dominance        Extremity/Trunk Assessment   Upper Extremity Assessment Upper Extremity Assessment: Generalized weakness RUE Deficits / Details: can pull up on Stedy and bring hand to face for grooming task RUE Sensation: WNL RUE Coordination: decreased fine motor;decreased gross motor LUE Deficits / Details: can pull up on Stedy and bring hand to face for grooming task LUE Sensation: WNL LUE  Coordination: decreased fine motor;decreased gross motor    Lower Extremity Assessment Lower Extremity Assessment: Defer to PT evaluation       Communication      Cognition Arousal/Alertness: Awake/alert Behavior During Therapy: Flat affect (pt in a semi catatonic state up to half of the time during the session.) Overall Cognitive Status: Impaired/Different from baseline Area of Impairment: Orientation, Following commands, Safety/judgement, Problem solving, Attention, Memory, Awareness                 Orientation Level: Disoriented to, Place, Time, Situation Current Attention Level: Focused, Sustained Memory: Decreased short-term memory Following Commands: Follows one step commands inconsistently, Follows one step commands with increased time Safety/Judgement: Decreased awareness of safety, Decreased awareness of deficits Awareness: Intellectual Problem Solving: Slow processing, Decreased initiation, Difficulty sequencing, Requires verbal cues, Requires tactile cues          General Comments General comments (skin integrity, edema, etc.): vss though out.    Exercises     Assessment/Plan    PT Assessment    PT Problem List         PT Treatment Interventions      PT Goals (Current goals can be found in the Care Plan section)  Acute Rehab PT Goals Patient Stated Goal: To get in and out of bed more easily.    Frequency       Co-evaluation PT/OT/SLP Co-Evaluation/Treatment: Yes Reason for Co-Treatment: Complexity of the patient's impairments (multi-system involvement) PT goals addressed during session: Mobility/safety with mobility OT goals addressed during session: ADL's and self-care;Strengthening/ROM       AM-PAC PT "6 Clicks" Mobility  Outcome Measure                  End of Session              Time:  -      Charges:              {enter signature dotphrase here}  Tessie Fass Irma Roulhac 03/14/2022, 1:06 PM

## 2022-03-14 NOTE — Progress Notes (Signed)
Physical Therapy Treatment Patient Details Name: Tamara Russell MRN: [id number] DOB: 08/27/1961 Today's Date: 03/14/2022   History of Present Illness 61 y.o. female who presents after being found altered this morning sitting on the porch. Patient had just recently been hospitalized from 4/19-4/23 after presenting for altered mental status. MRI negative; Psychiatry consult due to recent home stressors and pt with hallucinations; +delirium. Pt intubated 5/7-5/9, re-intubated 5/11-5/14 with concern for aspiration PNA and SBO. PMH significant of HTN, reformed smoker, recently hypoxic and started on home O2, and obesity.    PT Comments    Pt still can not fully participate to progress well toward goals.  Up to half of the treatment session pt goes inward into a "semi-catatonic" state and then can not participate.  When alert and aware, even confused she can participate fairly effectively.  Emphasis on transitions to EOB,  scooting, sitting balance, sit to stands in the STEDY and end point in the chair for sitting tolerance.   Recommendations for follow up therapy are one component of a multi-disciplinary discharge planning process, led by the attending physician.  Recommendations may be updated based on patient status, additional functional criteria and insurance authorization.  Follow Up Recommendations  Skilled nursing-short term rehab (<3 hours/day)     Assistance Recommended at Discharge Frequent or constant Supervision/Assistance  Patient can return home with the following Two people to help with walking and/or transfers;Two people to help with bathing/dressing/bathroom;Assistance with cooking/housework;Direct supervision/assist for medications management;Direct supervision/assist for financial management;Assist for transportation;Help with stairs or ramp for entrance   Equipment Recommendations  None recommended by PT    Recommendations for Other Services Rehab consult      Precautions / Restrictions Precautions Precautions: Fall Precaution Comments: delirium Restrictions Weight Bearing Restrictions: No     Mobility  Bed Mobility Overal bed mobility: Needs Assistance Bed Mobility: Supine to Sit     Supine to sit: Mod assist, +2 for physical assistance, HOB elevated     General bed mobility comments: pt was able to sustain attention on task to sit up and scoot toward the EOB.  lost focus with distractions, but was able to be directed and follow simple direction.  truncal/stability assist.    Transfers Overall transfer level: Needs assistance Equipment used: Ambulation equipment used (STEDY) Transfers: Sit to/from Stand Sit to Stand: Max assist, Total assist, +2 physical assistance, +2 safety/equipment, From elevated surface           General transfer comment: When fully aware and "in the moment", pt was able to follow maximal cues and facilitation to work on standing x4, 2 from bed level and 2 from STEDY level.  In between stands, pt consistently entered this semi-catatonic state (for lack of a better term) where she would grunt affirmation to "Tamara Russell".  At these time, pt is unable to follow commands or mobilie and is total assist.    Ambulation/Gait               General Gait Details: Unable.   Stairs             Wheelchair Mobility    Modified Rankin (Stroke Patients Only)       Balance Overall balance assessment: Needs assistance Sitting-balance support: Feet supported, Bilateral upper extremity supported Sitting balance-Leahy Scale: Poor Sitting balance - Comments: can balance with UE assist when mentally aware otherwise needs truncal support.     Standing balance-Leahy Scale: Zero Standing balance comment: stood x 4 in steady.  Attempted only  when pt was aware and able to follow commands--about 50% of time when not in semi-catatonic state.                            Cognition Arousal/Alertness:  Awake/alert Behavior During Therapy: Flat affect (pt in a semi catatonic state up to half of the time during the session.) Overall Cognitive Status: Impaired/Different from baseline Area of Impairment: Orientation, Following commands, Safety/judgement, Problem solving, Attention, Memory, Awareness                 Orientation Level: Disoriented to, Place, Time, Situation Current Attention Level: Focused, Sustained Memory: Decreased short-term memory Following Commands: Follows one step commands inconsistently, Follows one step commands with increased time Safety/Judgement: Decreased awareness of safety, Decreased awareness of deficits Awareness: Intellectual Problem Solving: Slow processing, Decreased initiation, Difficulty sequencing, Requires verbal cues, Requires tactile cues          Exercises      General Comments General comments (skin integrity, edema, etc.): VSS on RA during session, spouse present and supportive      Pertinent Vitals/Pain Pain Assessment Pain Assessment: Faces Faces Pain Scale: Hurts a little bit Pain Location: general R knee arthritis Pain Descriptors / Indicators: Discomfort, Grimacing Pain Intervention(s): Monitored during session, Limited activity within patient's tolerance    Home Living                          Prior Function            PT Goals (current goals can now be found in the care plan section) Acute Rehab PT Goals Patient Stated Goal: To get in and out of bed more easily. PT Goal Formulation: With family Time For Goal Achievement: 03/15/22 Potential to Achieve Goals: Fair Progress towards PT goals: Progressing toward goals    Frequency    Min 3X/week      PT Plan Current plan remains appropriate    Co-evaluation PT/OT/SLP Co-Evaluation/Treatment: Yes Reason for Co-Treatment: Complexity of the patient's impairments (multi-system involvement) PT goals addressed during session: Mobility/safety with  mobility OT goals addressed during session: ADL's and self-care;Strengthening/ROM      AM-PAC PT "6 Clicks" Mobility   Outcome Measure  Help needed turning from your back to your side while in a flat bed without using bedrails?: Total Help needed moving from lying on your back to sitting on the side of a flat bed without using bedrails?: Total Help needed moving to and from a bed to a chair (including a wheelchair)?: Total Help needed standing up from a chair using your arms (e.g., wheelchair or bedside chair)?: Total Help needed to walk in hospital room?: Total Help needed climbing 3-5 steps with a railing? : Total 6 Click Score: 6    End of Session   Activity Tolerance: Patient tolerated treatment well Patient left: in chair;with call bell/phone within reach;Other (comment) (onto lift pad though transferred by STEDY to chair.) Nurse Communication: Mobility status;Need for lift equipment PT Visit Diagnosis: Other abnormalities of gait and mobility (R26.89);Muscle weakness (generalized) (M62.81);Other symptoms and signs involving the nervous system (R29.898)     Time: [id number] PT Time Calculation (min) (ACUTE ONLY): 33 min  Charges:  $Therapeutic Activity: 8-22 mins                     03/14/2022  Jacinto HalimKen M., PT Acute Rehabilitation Services [phone]  (pager) [phone]  (office)  Eliseo Gum Gladies Sofranko 03/14/2022, 2:06 PM

## 2022-03-14 NOTE — Progress Notes (Signed)
PROGRESS NOTE    Tamara Russell  AQT:[id number] DOB: October 08, 1961 DOA: 02/06/2022 PCP: [name], MD  Patient is a 61 y.o.  female morbid obesity, HTN, GERD-who was just discharged from this facility on 4/23-presented to the hospital on 4/24 with altered mental status.  Found to have metabolic encephalopathy.   Significant events: 4/19-4/23>> hospitalization for chest pain, confusion - UTI. 4/24 Presented to the ED for confusion-admit to [facility]. 4/28 Neuro eval - marital stress possibly causing AMS - psych consult recommended 5/01 Episode of aspiration - bradycardia/worsening hypoxemia - improved after suctioning.CXR with bibasilar opacities-IV Ancef started. 5/04 Neuro re-eval per psych rec's-?autoimmune process causing AMS 5/05 Trial of Depakote per neurology. 5/06 Significant sedation / lethargy following Haldol/Ativan -concern for hypercarbia -BiPAP started. LP postponed. Haldol/Ativan dosage adjusted by psychiatry. 5/07 Intubated, eeg neg neuro consulted.  5/08 LP completed. Cortrak placed. 5/09 Extubated, continues to have agitation, delirium with psychosis, and then episodes of minimal responsiveness 5/12 Intubated in setting SBO and suspected aspiration 5/15 Extubated, respiratory status improving and mental status clearing 5/19 Started on IVIG per neurology for empiric autoimmune encephalitis. 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  Multiple EEGs without seizure activity LP obtained without evidence of pleocytosis; autoimmune encephalopathy panel sent to [facility], pending. Neurology recommended IVIG for empiric treatment of autoimmune encephalitis after infectious etiology was ruled out -Psychiatry recommended no changes to current medications; per their assessment, patient does not have capacity to make medical decisions. Metabolic work-up including TSH and free T4, cortisol level, ammonia, ethanol level, RPR, vitamin B1, vitamin B12, blood lead level, Depakote level not revealing. -I suspect delirium and hypercarbia to be contributing, ABG with CO2 in the 60s 5/24, restarted on CPAP, fortunately he was able to tolerate this last few nights, continue melatonin for sleep and Requip for restless legs, also suspect a psychogenic component as well -Completed 5-day course of IVIG 5/23 -Has continued to improve slowly over the last 3 days -Discharge planning, discussed with family regarding need for rehab -Will need Neurology follow-up   Acute on chronic diastolic CHF (congestive heart failure) Elevated troponin -Is been on PRN Lasix this admission -Diuresed with IV Lasix X 2 days, 3 L negative, diuretics held yesterday, sodium improving, will start torsemide 20 Mg daily on account of hypoalbuminemia, monitor sodium closely   Left distal DVT/acute Right knee pain -Found to have left distal tibial peroneal vein DVT this admission -Continue Eliquis -Continue oral diuretics for CHF, suspect she may have osteoarthritis in right knee will add Voltaren gel  Constipation -Continue current laxatives, add lactulose   UTI (urinary tract infection) Urine culture grew E. coli, Enterococcus and Citrobacter not in a significant amount. Treated with fosfomycin earlier during admission.  Later actually completed 7 days of antibiotics for aspiration pneumonia. -Resolved   Aspiration  pneumonia Dysphagia Leukocytosis Multiple episodes of aspiration this admission,Klebsiella isolated on ET aspirate. Completed antibiotic course for 7 days. Patient had a cortrak for tube feeding which was removed  as patient was able to pass swallowing evaluation with speech therapy. -Monitor clinically   Ileus (HCC) Small bowel obstruction, has been ruled out. -Seen by general surgery earlier this admission Patient's symptoms appear to have resolved. Having bowel movements.   Acute on chronic respiratory failure with hypoxia and hypercapnia  Per family, oxygen is a new requirement from previous admission.  Hypercarbia mostly in the setting of obesity/OSA and sedation from medications. Patient was intubated twice this admission secondary to aspiration issues. Now on supplemental oxygen via nasal canula.   Obesity, Class III, BMI 40-49.9 (morbid obesity) (HCC) OSA (obstructive sleep apnea) BMI is 42, was unable to tolerate BiPAP in the past, continue CPAP   Peptic ulcer disease GERD. Continuing PPI.  Hypertension blood pressure stable.  Hypokalemia, Hypernatremia Resolved.  Microcytic anemia, No bleeding.  Hemoglobin stable.   DVT prophylaxis: Apixaban Code Status: Full code Family Communication: Spouse at bedside today Disposition Plan: SNF when bed available  Consultants: Neurology, psychiatry   Procedures:   Antimicrobials:    Objective: Vitals:   03/13/22 2325 03/14/22 0326 03/14/22 0730 03/14/22 1130  BP:  122/69  109/63  Pulse: 86 94  88  Resp: (!) 22 (!) 22    Temp:  98.9 F (37.2 C) 98.8 F (37.1 C) 98.6 F (37 C)  TempSrc:  Oral Oral Oral  SpO2: 97% 96%  95%  Weight:      Height:        Intake/Output Summary (Last 24 hours) at 03/14/2022 1158 Last data filed at 03/14/2022 0615 Gross per 24 hour  Intake --  Output 1650 ml  Net -1650 ml   Filed Weights   02/27/22 0600 03/05/22 1800 03/09/22 0354  Weight: 101 kg 101 kg 101.4 kg     Examination:  General exam: Pleasant chronically ill female sitting up in bed eating breakfast, awake alert oriented to self, place and partly to time, more lucid today CVS: S1-S2, regular rhythm Lungs: Improving air movement, decreased breath sounds bases Abdomen: Soft, obese, nontender, bowel sounds present Extremities: Trace edema in right lower extremity Neuro: Bilateral lower extremity weakness, poor effort Skin: No rashes Psychiatry: Flat affect    Data Reviewed:   CBC: Recent Labs  Lab 03/08/22 0515 03/09/22 0145 03/11/22 0557 03/13/22 0600 03/14/22 0442  WBC 5.4 6.3 5.8 6.4 7.4  HGB 9.6* 9.4* 9.2* 9.8* 9.3*  HCT 32.2* 31.0* 30.8* 32.3* 30.6*  MCV 80.1 79.3* 78.8* 78.0* 77.7*  PLT 263 225 187 198 189   Basic Metabolic Panel: Recent Labs  Lab 03/10/22 0525 03/11/22 0557 03/12/22 0524 03/13/22 0600 03/14/22 0442  NA 132* 134* 134* 129* 131*  K 3.3* 3.8 3.9 3.8 3.8  CL 92* 94* 92* 88* 91*  CO2 36* 35* 34* 34* 34*  GLUCOSE 101* 92 103* 98 96  BUN 5* 8 7* 7* 10  CREATININE 0.41* 0.40* 0.43* 0.41* 0.44  CALCIUM 8.5* 8.4* 8.8* 8.9 8.8*   GFR: Estimated Creatinine Clearance: 78.6 mL/min (by C-G formula based on SCr of 0.44 mg/dL). Liver Function Tests: Recent Labs  Lab 03/08/22 0515 03/09/22 0145 03/14/22 0442  AST 21 23 21   ALT 12 12 13   ALKPHOS 46 49 50  BILITOT 0.2* 0.5 0.4  PROT 7.5 7.1 6.4*  ALBUMIN 1.9* 1.9* 2.0*   No results for input(s): LIPASE, AMYLASE in the last 168 hours. Recent Labs  Lab 03/09/22 1506  AMMONIA 24   Coagulation Profile: No results for input(s): INR, PROTIME in the last 168 hours. Cardiac  Enzymes: No results for input(s): CKTOTAL, CKMB, CKMBINDEX, TROPONINI in the last 168 hours. BNP (last 3 results) No results for input(s): PROBNP in the last 8760 hours. HbA1C: No results for input(s): HGBA1C in the last 72 hours. CBG: Recent Labs  Lab 03/08/22 1523 03/10/22 0712 03/10/22 1108 03/10/22 1528  03/11/22 0732  GLUCAP 141* 99 105* 123* 102*   Lipid Profile: No results for input(s): CHOL, HDL, LDLCALC, TRIG, CHOLHDL, LDLDIRECT in the last 72 hours. Thyroid Function Tests: No results for input(s): TSH, T4TOTAL, FREET4, T3FREE, THYROIDAB in the last 72 hours. Anemia Panel: No results for input(s): VITAMINB12, FOLATE, FERRITIN, TIBC, IRON, RETICCTPCT in the last 72 hours. Urine analysis:    Component Value Date/Time   COLORURINE YELLOW 02/06/2022 1425   APPEARANCEUR CLEAR 02/06/2022 1425   LABSPEC 1.023 02/06/2022 1425   PHURINE 7.0 02/06/2022 1425   GLUCOSEU NEGATIVE 02/06/2022 1425   HGBUR NEGATIVE 02/06/2022 1425   BILIRUBINUR NEGATIVE 02/06/2022 1425   KETONESUR 80 (A) 02/06/2022 1425   PROTEINUR NEGATIVE 02/06/2022 1425   NITRITE NEGATIVE 02/06/2022 1425   LEUKOCYTESUR NEGATIVE 02/06/2022 1425   Sepsis Labs: @LABRCNTIP (procalcitonin:4,lacticidven:4)  )No results found for this or any previous visit (from the past 240 hour(s)).   Radiology Studies: No results found.   Scheduled Meds:  apixaban  5 mg Oral BID   atorvastatin  40 mg Oral QHS   chlorhexidine  15 mL Mouth Rinse BID   Chlorhexidine Gluconate Cloth  6 each Topical Daily   feeding supplement  237 mL Oral BID BM   mouth rinse  15 mL Mouth Rinse BID   melatonin  5 mg Oral QPM   metoprolol succinate  25 mg Oral Daily   multivitamin with minerals  1 tablet Oral Daily   pantoprazole  40 mg Oral Q1200   rOPINIRole  0.25 mg Oral QPM   senna-docusate  1 tablet Oral BID   sodium chloride flush  10-40 mL Intracatheter Q12H   thiamine  100 mg Oral Daily   Continuous Infusions:  sodium chloride Stopped (02/27/22 1532)     LOS: 35 days    Time spent: 03/01/22  , MD [facility]   03/14/2022, 11:58 AM

## 2022-03-14 NOTE — Progress Notes (Signed)
Occupational Therapy Treatment Patient Details Name: Tamara Russell MRN: [id number] DOB: [birth year] Today's Date: 03/14/2022   History of present illness 61 y.o. female who presents after being found altered this morning sitting on the porch. Patient had just recently been hospitalized from 4/19-4/23 after presenting for altered mental status. MRI negative; Psychiatry consult due to recent home stressors and pt with hallucinations; +delirium. Pt intubated 5/7-5/9, re-intubated 5/11-5/14 with concern for aspiration PNA and SBO. PMH significant of HTN, reformed smoker, recently hypoxic and started on home O2, and obesity.   OT comments  Pt making slow progress towards goals, demo's ability to sit upright at EOB/Stedy for ~15 minutes, however drops to forearms frequently due to fatigue. Pt min A for grooming task while up in Stedy, max A +2 for simulated toilet transfer with use of Stedy. Pt follows commands inconsistently, during catatonic-like episodes pt unable to follow commands. Pt presenting with impairments listed below, will follow acutely. Continue to recommend SNF at d/c.    Recommendations for follow up therapy are one component of a multi-disciplinary discharge planning process, led by the attending physician.  Recommendations may be updated based on patient status, additional functional criteria and insurance authorization.    Follow Up Recommendations  Skilled nursing-short term rehab (<3 hours/day)    Assistance Recommended at Discharge Frequent or constant Supervision/Assistance  Patient can return home with the following  Two people to help with walking and/or transfers;Two people to help with bathing/dressing/bathroom;Assistance with cooking/housework;Assistance with feeding;Help with stairs or ramp for entrance;Assist for transportation;Direct supervision/assist for financial management;Direct supervision/assist for medications management   Equipment Recommendations  None  recommended by OT;Other (comment) (defer to next venue of care)    Recommendations for Other Services      Precautions / Restrictions Precautions Precautions: Fall Precaution Comments: delirium Restrictions Weight Bearing Restrictions: No       Mobility Bed Mobility Overal bed mobility: Needs Assistance Bed Mobility: Supine to Sit     Supine to sit: Mod assist, +2 for physical assistance, HOB elevated     General bed mobility comments: pt able to sit EOB/in Stedy for ~15 min during session    Transfers Overall transfer level: Needs assistance Equipment used: Ambulation equipment used [employer]) Transfers: Sit to/from Stand, Bed to chair/wheelchair/BSC Sit to Stand: Max assist, Total assist, +2 physical assistance, +2 safety/equipment, From elevated surface           General transfer comment: able to stand from [employer], fatigues quickly, dropping to forearms Transfer via Lift Equipment: Stedy   Balance Overall balance assessment: Needs assistance Sitting-balance support: Feet supported, Bilateral upper extremity supported Sitting balance-Leahy Scale: Poor Sitting balance - Comments: can balance with UE assist when mentally aware otherwise needs truncal support.     Standing balance-Leahy Scale: Zero Standing balance comment: stood x 4 in steady.  Attempted only when pt was aware and able to follow commands--about 50% of time when not in semi-catatonic state.                           ADL either performed or assessed with clinical judgement   ADL Overall ADL's : Needs assistance/impaired     Grooming: Minimal assistance;Cueing for sequencing;Sitting Grooming Details (indicate cue type and reason): increased cuing for sequencing                 Toilet Transfer: Maximal assistance;+2 for physical assistance;With caregiver independent Engineer, drilling Details (indicate cue type  and reason): simulated to chair with use of Stedy          Functional mobility during ADLs: Maximal assistance;+2 for physical assistance      Extremity/Trunk Assessment Upper Extremity Assessment Upper Extremity Assessment: Generalized weakness RUE Deficits / Details: can pull up on Stedy and bring hand to face for grooming task RUE Sensation: WNL RUE Coordination: decreased fine motor;decreased gross motor LUE Deficits / Details: can pull up on Stedy and bring hand to face for grooming task LUE Sensation: WNL LUE Coordination: decreased fine motor;decreased gross motor   Lower Extremity Assessment Lower Extremity Assessment: Defer to PT evaluation        Vision   Vision Assessment?: Vision impaired- to be further tested in functional context Additional Comments: tracks R/L during session, will further assess   Perception Perception Perception: Not tested   Praxis Praxis Praxis: Not tested    Cognition Arousal/Alertness: Awake/alert Behavior During Therapy: Flat affect Overall Cognitive Status: Difficult to assess Area of Impairment: Orientation, Following commands, Safety/judgement, Problem solving, Attention, Memory, Awareness                   Current Attention Level: Focused (difficulty with distractions) Memory: Decreased short-term memory Following Commands: Follows one step commands inconsistently, Follows one step commands with increased time Safety/Judgement: Decreased awareness of safety, Decreased awareness of deficits Awareness: Intellectual Problem Solving: Slow processing, Decreased initiation, Difficulty sequencing, Requires verbal cues, Requires tactile cues          Exercises      Shoulder Instructions       General Comments VSS on RA during session, spouse present and supportive    Pertinent Vitals/ Pain       Pain Assessment Pain Assessment: Faces Pain Location: R knee arthritis. Pain Descriptors / Indicators: Discomfort, Grimacing Pain Intervention(s): Limited activity within  patient's tolerance, Monitored during session, Repositioned  Home Living                                          Prior Functioning/Environment              Frequency  Min 2X/week        Progress Toward Goals  OT Goals(current goals can now be found in the care plan section)  Progress towards OT goals: Progressing toward goals  Acute Rehab OT Goals Patient Stated Goal: none stated OT Goal Formulation: With patient/family Time For Goal Achievement: 03/28/22 Potential to Achieve Goals: Good ADL Goals Pt Will Perform Grooming: with min guard assist;sitting Pt Will Perform Upper Body Dressing: with min assist;sitting Pt Will Perform Lower Body Dressing: with mod assist;sit to/from stand Pt Will Transfer to Toilet: with mod assist;stand pivot transfer;bedside commode Pt Will Perform Toileting - Clothing Manipulation and hygiene: with supervision;sitting/lateral leans;sit to/from stand Additional ADL Goal #1: Tolerate sitting edge of bed unsupported up to 10 min to increase ADL independence Additional ADL Goal #2: Pt will follow single step command accurately 75% of the time in order to improve initiatio with ADL/mobility tasks  Plan Discharge plan remains appropriate    Co-evaluation    PT/OT/SLP Co-Evaluation/Treatment: Yes Reason for Co-Treatment: Complexity of the patient's impairments (multi-system involvement) PT goals addressed during session: Mobility/safety with mobility OT goals addressed during session: ADL's and self-care;Strengthening/ROM      AM-PAC OT "6 Clicks" Daily Activity     Outcome Measure   Help  from another person eating meals?: A Lot Help from another person taking care of personal grooming?: A Little Help from another person toileting, which includes using toliet, bedpan, or urinal?: Total Help from another person bathing (including washing, rinsing, drying)?: A Lot Help from another person to put on and taking off regular  upper body clothing?: A Lot Help from another person to put on and taking off regular lower body clothing?: Total 6 Click Score: 11    End of Session Equipment Utilized During Treatment: Gait belt;Other (comment) Antony Salmon(Stedy)  OT Visit Diagnosis: Unsteadiness on feet (R26.81);Other symptoms and signs involving cognitive function;Other abnormalities of gait and mobility (R26.89);Muscle weakness (generalized) (M62.81);Pain Pain - Right/Left: Right Pain - part of body: Knee   Activity Tolerance Patient limited by lethargy   Patient Left in chair;with call bell/phone within reach;with chair alarm set;with family/visitor present   Nurse Communication Mobility status;Need for lift equipment        Time: 1209-1242 OT Time Calculation (min): 33 min  Charges: OT General Charges $OT Visit: 1 Visit OT Treatments $Self Care/Home Management : 8-22 mins  Alfonzo BeersNatalie Deshannon Hinchliffe, OTD, OTR/L Acute Rehab [phone]   Mayer Maskeratalie M Lexy Meininger 03/14/2022, 1:49 PM

## 2022-03-15 DIAGNOSIS — I5033 Acute on chronic diastolic (congestive) heart failure: Secondary | ICD-10-CM | POA: Diagnosis not present

## 2022-03-15 DIAGNOSIS — G9341 Metabolic encephalopathy: Secondary | ICD-10-CM | POA: Diagnosis not present

## 2022-03-15 DIAGNOSIS — I824Y2 Acute embolism and thrombosis of unspecified deep veins of left proximal lower extremity: Secondary | ICD-10-CM

## 2022-03-15 DIAGNOSIS — K279 Peptic ulcer, site unspecified, unspecified as acute or chronic, without hemorrhage or perforation: Secondary | ICD-10-CM | POA: Diagnosis not present

## 2022-03-15 LAB — BASIC METABOLIC PANEL
Anion gap: 5 (ref 5–15)
BUN: 8 mg/dL (ref 8–23)
CO2: 39 mmol/L — ABNORMAL HIGH (ref 22–32)
Calcium: 8.9 mg/dL (ref 8.9–10.3)
Chloride: 90 mmol/L — ABNORMAL LOW (ref 98–111)
Creatinine, Ser: 0.41 mg/dL — ABNORMAL LOW (ref 0.44–1.00)
GFR, Estimated: >60 mL/min (ref >60)
Glucose, Bld: 91 mg/dL (ref 70–99)
Potassium: 3.2 mmol/L — ABNORMAL LOW (ref 3.5–5.1)
Sodium: 134 mmol/L — ABNORMAL LOW (ref 135–145)

## 2022-03-15 MED ORDER — POTASSIUM CHLORIDE CRYS ER 20 MEQ PO TBCR
40.0000 meq | EXTENDED_RELEASE_TABLET | Freq: Once | ORAL | Status: AC
  Administered 2022-03-15: 40 meq via ORAL
  Filled 2022-03-15: qty 2

## 2022-03-15 NOTE — Progress Notes (Signed)
Pt placed on CPAP with 2 lpm bled in oxygen.

## 2022-03-15 NOTE — Assessment & Plan Note (Signed)
Patient had multiple episodes of aspiration,  Culture from ET aspiration positive for Klebsiella. Completed 7 days of antibiotic therapy.  Ng tube has been removed and patient is tolerating po well.

## 2022-03-15 NOTE — Assessment & Plan Note (Signed)
Urine culture positive for E coli, enterococcus and citrobacter.  Received antibiotic therapy with fosfomycin.

## 2022-03-15 NOTE — Assessment & Plan Note (Addendum)
Delirium.  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  Acute on chronic respiratory failure with hypoxemia and hypercapnia.   Workup has included EEG and LP. Autoimmune encephalitis panel is pending, sent to [facility].  Metabolic work-up including TSH and free T4, cortisol level, ammonia, ethanol level, RPR, vitamin B1, vitamin B12, blood lead level, Depakote level not revealing.   Has completed 5 days of IV IG infusion with good toleration.  Slowly improving.  Plan to transfer to rehab. Encourage out of bed to chair tid.

## 2022-03-15 NOTE — Assessment & Plan Note (Signed)
Calculated BMI is 41.2  ?

## 2022-03-15 NOTE — Assessment & Plan Note (Signed)
Cell count has been stable.  

## 2022-03-15 NOTE — Assessment & Plan Note (Signed)
Left lower extremity DVT.  Plan to continue anticoagulation with apixaban with good toleration.  Right knee pain, continue with diclofenac gel.  Continue with bowel regimen.

## 2022-03-15 NOTE — Assessment & Plan Note (Signed)
Continue with Cpap 

## 2022-03-15 NOTE — Progress Notes (Signed)
Nutrition Follow-up  DOCUMENTATION CODES:  Obesity unspecified  INTERVENTION:  Continue regular diet with no added salt packets Continue Ensure Enlive po BID, each supplement provides 350 kcal and 20 grams of protein. Continue vitamin regimen  NUTRITION DIAGNOSIS:  Inadequate oral intake related to acute illness as evidenced by meal completion < 50%. - remains applicable  GOAL:  Patient will meet greater than or equal to 90% of their needs - diet and supplements in place  MONITOR:  PO intake, Supplement acceptance, Weight trends, I & O's  REASON FOR ASSESSMENT:  Ventilator, Other (Comment) (Cortrak Consult)    ASSESSMENT:  61 yo female admitted with acute psychosis/delerium, developed acute on chronic respiratory failure and required transfer to ICU and intubation.  PMH includes HTN, GERD, hx of COVID-19 infection  4/25 Admitted 5/7 Intubated 5/8 IR for LP, Cortrak placed 5/9 Extubated 5/12 Re-intubated 5/14 - extubated 5/16 - Diet advanced to regular per SLP 5/17 - cortrak tube removed  Pt continuing to work with therapies this AM and is ready for dc. Has several SNF offers.  Pt's intake has improved slightly since last assessment. Will continue current interventions at this time.  Average Meal Intake: 5/18-5/24: 40% intake x 4 recorded meals 5/25-5/31: 57% intake x 7 recorded meals  Nutritionally Relevant Medications: Scheduled Meds:  atorvastatin  40 mg Oral QHS   Ensure Enlive  237 mL Oral BID BM   multivitamin with minerals  1 tablet Oral Daily   pantoprazole  40 mg Oral Q1200   potassium chloride  20 mEq Oral Daily   senna-docusate  1 tablet Oral BID   thiamine  100 mg Oral Daily   torsemide  20 mg Oral Daily   Labs Reviewed: Na 134, chloride 90 K 3.2 creatinine .41  Nutrition Labs: Vitamin B12 2,964 Thiamine 133.6 Iron 42  NUTRITION - FOCUSED PHYSICAL EXAM: Flowsheet Row Most Recent Value  Orbital Region No depletion  Upper Arm Region No  depletion  Thoracic and Lumbar Region No depletion  Buccal Region Unable to assess  Temple Region No depletion  Clavicle Bone Region No depletion  Clavicle and Acromion Bone Region No depletion  Scapular Bone Region No depletion  Dorsal Hand Unable to assess  Patellar Region Unable to assess  Anterior Thigh Region Unable to assess  Posterior Calf Region Unable to assess  Edema (RD Assessment) Moderate   Diet Order:   Diet Order             Diet regular Room service appropriate? Yes with Assist; Fluid consistency: Thin  Diet effective now                   EDUCATION NEEDS:  Not appropriate for education at this time  Skin:  Skin Assessment: Skin Integrity Issues: Skin Integrity Issues:: Other (Comment), Incisions Incisions: R buttocks, L groin Other: MARSI R labia  Last BM:  5/30 - type 6  Height:  Ht Readings from Last 1 Encounters:  03/05/22 5\' 1"  (1.549 m)    Weight:  Wt Readings from Last 1 Encounters:  03/15/22 98.9 kg    Ideal Body Weight:  47.7 kg  BMI:  Body mass index is 41.2 kg/m.  Estimated Nutritional Needs:  Kcal:  1550-1750 kcals Protein:  85-105g Fluid:  >/= 1.5 L   Ranell Patrick, RD, LDN Clinical Dietitian RD pager # available in [facility]  After hours/weekend pager # available in [facility]

## 2022-03-15 NOTE — Progress Notes (Signed)
Physical Therapy Treatment Patient Details Name: Tamara Russell MRN: [id number] DOB: 1960-12-12 Today's Date: 03/15/2022   History of Present Illness 62 y.o. female admitted 4/24 with AMS. MRI negative; Psychiatry following, pt with hallucinations; +delirium. Pt intubated 5/7-5/9, re-intubated 5/11-5/14 with concern for aspiration PNA and SBO. PMHx: Admit 4/19-4/23 for AMS, HTN, reformed smoker, recently hypoxic and started on home O2, and obesity.    PT Comments    Pt alert and pleasant, initially aware of husband then asking who he is. Pt able to progress to sitting EOB and use of stedy for OOB to chair. Pt remains significantly limited by cognition, inability to follow all commands and lack of awareness.  Will continue to work toward improved function and standing tolerance. Goals updated. D/c plan remains appropriate.   Pt with desaturation to 85% on RA with activity with return to 2L and sats 92-99%   Recommendations for follow up therapy are one component of a multi-disciplinary discharge planning process, led by the attending physician.  Recommendations may be updated based on patient status, additional functional criteria and insurance authorization.  Follow Up Recommendations  Skilled nursing-short term rehab (<3 hours/day)     Assistance Recommended at Discharge Frequent or constant Supervision/Assistance  Patient can return home with the following Two people to help with walking and/or transfers;Two people to help with bathing/dressing/bathroom;Assistance with cooking/housework;Direct supervision/assist for medications management;Direct supervision/assist for financial management;Assist for transportation;Help with stairs or ramp for entrance   Equipment Recommendations  Hospital bed;Wheelchair cushion (measurements PT);Wheelchair (measurements PT)    Recommendations for Other Services       Precautions / Restrictions Precautions Precautions: Fall;Other  (comment) Precaution Comments: delirium, watch sats     Mobility  Bed Mobility Overal bed mobility: Needs Assistance Bed Mobility: Supine to Sit     Supine to sit: Mod assist, HOB elevated     General bed mobility comments: multimodal cues to intiate moving legs toward EOB, pt not following cues for hand placement, increased time and mod assist to pivot to right side of bed    Transfers Overall transfer level: Needs assistance   Transfers: Sit to/from Stand, Bed to chair/wheelchair/BSC Sit to Stand: Max assist, +2 physical assistance           General transfer comment: pt able to stand x 5 total trials to stedy with pt able to clear sacrum but at no point able to extend trunk in standing with bari pad able to swing under sacrum for transfer to chair. Standing tolerance grossly 5-10 sec Transfer via Lift Equipment: Stedy  Ambulation/Gait               General Gait Details: Unable.   Stairs             Wheelchair Mobility    Modified Rankin (Stroke Patients Only)       Balance Overall balance assessment: Needs assistance   Sitting balance-Leahy Scale: Fair Sitting balance - Comments: EOB without physical assist, guarding due to cognition   Standing balance support: During functional activity Standing balance-Leahy Scale: Zero Standing balance comment: bil UE support with flexed trunk                            Cognition Arousal/Alertness: Awake/alert Behavior During Therapy: Flat affect Overall Cognitive Status: Impaired/Different from baseline Area of Impairment: Orientation, Following commands, Safety/judgement, Problem solving, Attention, Memory, Awareness  Orientation Level: Disoriented to, Place, Time, Situation Current Attention Level: Focused Memory: Decreased short-term memory Following Commands: Follows one step commands inconsistently, Follows one step commands with increased time Safety/Judgement:  Decreased awareness of safety, Decreased awareness of deficits   Problem Solving: Slow processing, Decreased initiation, Difficulty sequencing, Requires verbal cues, Requires tactile cues General Comments: pt with only a few brief moments of blank staring today but able to be redirected and continues to struggle with limited attention, orientation and awareness        Exercises General Exercises - Lower Extremity Long Arc Quad: AROM, Both, 10 reps, Seated    General Comments        Pertinent Vitals/Pain Pain Assessment Pain Assessment: No/denies pain    Home Living                          Prior Function            PT Goals (current goals can now be found in the care plan section) Acute Rehab PT Goals Patient Stated Goal: be able to get up and walk PT Goal Formulation: With family Time For Goal Achievement: 03/29/22 Potential to Achieve Goals: Fair Progress towards PT goals: Progressing toward goals    Frequency    Min 3X/week      PT Plan Current plan remains appropriate    Co-evaluation              AM-PAC PT "6 Clicks" Mobility   Outcome Measure  Help needed turning from your back to your side while in a flat bed without using bedrails?: Total Help needed moving from lying on your back to sitting on the side of a flat bed without using bedrails?: Total Help needed moving to and from a bed to a chair (including a wheelchair)?: Total Help needed standing up from a chair using your arms (e.g., wheelchair or bedside chair)?: Total Help needed to walk in hospital room?: Total Help needed climbing 3-5 steps with a railing? : Total 6 Click Score: 6    End of Session Equipment Utilized During Treatment: Oxygen Activity Tolerance: Patient tolerated treatment well Patient left: in chair;with call bell/phone within reach;with family/visitor present Nurse Communication: Mobility status;Need for lift equipment PT Visit Diagnosis: Other abnormalities  of gait and mobility (R26.89);Muscle weakness (generalized) (M62.81);Other symptoms and signs involving the nervous system (R29.898)     Time: 9163-8466 PT Time Calculation (min) (ACUTE ONLY): 31 min  Charges:  $Therapeutic Activity: 23-37 mins                     Dhairya Corales P, PT Acute Rehabilitation Services Pager: [phone] Office: [phone]    Tamara Russell 03/15/2022, 10:55 AM

## 2022-03-15 NOTE — Progress Notes (Signed)
Progress Note   Patient: Tamara Russell XBM:[id number] DOB: [birth year] DOA: 02/06/2022     36 DOS: the patient was seen and examined on 03/15/2022   Brief hospital course: Patient is a 61 y.o.  female morbid obesity, HTN, GERD-who was just discharged from this facility on 4/23-presented to the hospital on 4/24 with altered mental status.  Found to have metabolic encephalopathy.  Significant events: 4/19-4/23>> hospitalization for chest pain, confusion - UTI. 4/24 Presented to the ED for confusion-admit to [facility]. 4/28 Neuro eval - marital stress possibly causing AMS - psych consult recommended 5/01 Episode of aspiration - bradycardia/worsening hypoxemia - improved after suctioning.CXR with bibasilar opacities-IV Ancef started. 5/04 Neuro re-eval per psych rec's-?autoimmune process causing AMS 5/05 Trial of Depakote per neurology. 5/06 Significant sedation / lethargy following Haldol/Ativan -concern for hypercarbia -BiPAP started. LP postponed. Haldol/Ativan dosage adjusted by psychiatry. 5/07 Intubated, eeg neg neuro consulted.  5/08 LP completed. Cortrak placed. 5/09 Extubated, continues to have agitated delirium with psychosis, and then episodes of minimal responsiveness 5/12 Intubated in setting SBO and suspected aspiration 5/15 Extubated, respiratory status improving and mental status clearing  05/16 transferred to [facility].   5/19 Completed antibiotic course the day before.  Started on IVIG. 5/23, 5 days IVIG completed, questionable improvement.  5/24 was started on CPAP after first 3 nights of poor compliance, she has slowly started improving, now compliant,  still some waxing and waning mental status but overall better   Assessment and Plan: * Acute metabolic encephalopathy Delirium.  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  Acute on chronic respiratory failure with hypoxemia and hypercapnia.   Workup has included EEG and LP. Autoimmune encephalitis panel is pending, sent to [facility].  Metabolic work-up including TSH and free T4, cortisol level, ammonia, ethanol level, RPR, vitamin B1, vitamin B12, blood lead level, Depakote level not revealing.   Has completed 5 days of IV IG infusion with good toleration.  Slowly improving.  Plan to transfer to rehab.   Acute on chronic diastolic CHF (congestive heart failure) (HCC) Patient with preserved LV systolic function  Positive troponin elevation due to heart failure. Acute coronary syndrome ruled out.   Continue blood pressure monitoring Diuresis with torsemide.   DVT (deep venous thrombosis) (HCC) Left lower extremity DVT.  Plan to continue anticoagulation with apixaban with good toleration.  Right knee pain, continue with diclofenac gel.  Continue with bowel regimen.   UTI (urinary tract infection) Urine culture positive for E coli, enterococcus and citrobacter.  Received antibiotic therapy with fosfomycin.   Ileus (HCC) Ruled out small bowel obstruction.  Continue bowel regimen, patient with no nausea or vomiting, tolerating po well.   Aspiration pneumonia [facility]) Patient had multiple episodes of aspiration,  Culture from ET aspiration positive for Klebsiella. Completed 7 days of antibiotic therapy.  Ng tube has been removed and patient is tolerating po well.   PUD (peptic ulcer disease) Continue blood pressure monitoring.   OSA (obstructive sleep apnea) Continue with Cpap.   Hypertension Continue blood pressure monitoring.  Continue with metoprolol.   Hypokalemia Hyponatremia.   Renal function with serum cr at 0,41, K is 3,2 and serum bicarbonate at 39. Na is 134.  Continue K supplementation and follow up renal function in am.   Microcytic anemia Cell count has been stable.   Obesity, Class III, BMI 40-49.9 (morbid obesity) (HCC) Calculated BMI is 41.2         Subjective: Patient is feeling better, no dyspnea or chest pain, continue to be  very weak and deconditioned   Physical  Exam: Vitals:   03/15/22 0716 03/15/22 1050 03/15/22 1129 03/15/22 1500  BP: 129/78  129/73 126/71  Pulse: 90  79 85  Resp: (!) 23  (!) 23 (!) 25  Temp: 98.9 F (37.2 C)  98 F (36.7 C) 97.8 F (36.6 C)  TempSrc: Axillary  Oral Oral  SpO2: 96% 95% 100% 98%  Weight:      Height:       Neurology awake and alert, slow to respond but follows commands and answers to simple questions ENT with no pallor Cardiovascular with S1 and S2 present and rhythmic with no gallops or murmurs Respiratory with no rales or wheezing Abdomen not distended No lower extremity edema  Data Reviewed:    Family Communication: no family at the bedside   Disposition: Status is: Inpatient Remains inpatient appropriate because: pending placement.   Planned Discharge Destination: Skilled nursing facility     Author: Coralie Keens, MD 03/15/2022 4:20 PM  For on call review [email or web address].

## 2022-03-15 NOTE — Assessment & Plan Note (Addendum)
Hyponatremia.   Today renal function with serum cr at 0,41, K is 3,3 and serum bicarbonate is 40.  Continue K correction with KCl 80 meq in 2 divided doses and follow up renal function in am. Hold on further diuresis for now.

## 2022-03-15 NOTE — Assessment & Plan Note (Addendum)
Echocardiogram with preserved LV systolic function with Ef 60 to 65%. RV systolic function preserved. No significant valvular disease.   Positive troponin elevation due to heart failure. Acute coronary syndrome ruled out.   Patient was placed on diuretic therapy with improvement in volume status.   At the time of her discharge she will continue with ARB and SGLT2i. Holding on diuretic therapy for now.

## 2022-03-15 NOTE — Assessment & Plan Note (Addendum)
Continue blood pressure monitoring.  Continue with metoprolol.

## 2022-03-15 NOTE — TOC Progression Note (Addendum)
Transition of Care [facility]) - Progression Note    Patient Details  Name: Tamara Russell MRN: [id number] Date of Birth: 04/09/1961  Transition of Care [facility]) CM/SW [place], [place] Phone Number: 03/15/2022, 11:24 AM  Clinical Narrative:    CSW met with pt and spouse at bedside. Spouse noted they had not yet toured [place], [place] set up for them to tour this AM. They did not like the other two options, so they will likely request [facility]. Spouse stated he wanted a private bed and only female staff, CSW advised that he should let the facility know. Spouse stated he would call CSW after the tour to give final decision. CSW will update facility. TOC will continue to follow for DC needs.  Family confirms they would like [place]. They are also requesting a private room and female only staff. CSW updated facility Liaison.  Expected Discharge Plan: Skilled Nursing Facility Barriers to Discharge: Continued Medical Work up, Ship broker  Expected Discharge Plan and Services Expected Discharge Plan: [place]   Discharge Planning Services: CM Consult                                           Social Determinants of Health (SDOH) Interventions    Readmission Risk Interventions     View : No data to display.

## 2022-03-15 NOTE — Assessment & Plan Note (Signed)
Continue blood pressure monitoring  

## 2022-03-15 NOTE — Assessment & Plan Note (Signed)
Ruled out small bowel obstruction.  Continue bowel regimen, patient with no nausea or vomiting, tolerating po well.

## 2022-03-16 DIAGNOSIS — G9341 Metabolic encephalopathy: Secondary | ICD-10-CM | POA: Diagnosis not present

## 2022-03-16 LAB — BASIC METABOLIC PANEL
Anion gap: 6 (ref 5–15)
BUN: 8 mg/dL (ref 8–23)
CO2: 40 mmol/L — ABNORMAL HIGH (ref 22–32)
Calcium: 8.7 mg/dL — ABNORMAL LOW (ref 8.9–10.3)
Chloride: 90 mmol/L — ABNORMAL LOW (ref 98–111)
Creatinine, Ser: 0.41 mg/dL — ABNORMAL LOW (ref 0.44–1.00)
GFR, Estimated: >60 mL/min (ref >60)
Glucose, Bld: 94 mg/dL (ref 70–99)
Potassium: 3.3 mmol/L — ABNORMAL LOW (ref 3.5–5.1)
Sodium: 136 mmol/L (ref 135–145)

## 2022-03-16 MED ORDER — DAPAGLIFLOZIN PROPANEDIOL 10 MG PO TABS
10.0000 mg | ORAL_TABLET | Freq: Every day | ORAL | Status: DC
  Filled 2022-03-16: qty 1

## 2022-03-16 MED ORDER — IRBESARTAN 75 MG PO TABS
37.5000 mg | ORAL_TABLET | Freq: Every day | ORAL | Status: DC
Start: 2022-03-16 — End: 2022-03-17
  Administered 2022-03-16 – 2022-03-17 (×2): 37.5 mg via ORAL
  Filled 2022-03-16 (×2): qty 0.5

## 2022-03-16 MED ORDER — POTASSIUM CHLORIDE CRYS ER 20 MEQ PO TBCR
20.0000 meq | EXTENDED_RELEASE_TABLET | Freq: Once | ORAL | Status: AC
  Administered 2022-03-16: 20 meq via ORAL
  Filled 2022-03-16: qty 1

## 2022-03-16 MED ORDER — POTASSIUM CHLORIDE CRYS ER 20 MEQ PO TBCR
40.0000 meq | EXTENDED_RELEASE_TABLET | Freq: Once | ORAL | Status: AC
  Administered 2022-03-16: 40 meq via ORAL
  Filled 2022-03-16: qty 2

## 2022-03-16 NOTE — Progress Notes (Signed)
Occupational Therapy Treatment Patient Details Name: Tyanna Hach MRN: [id number] DOB: May 26, 1961 Today's Date: 03/16/2022   History of present illness 61 y.o. female admitted 4/24 with AMS. MRI negative; Psychiatry following, pt with hallucinations; +delirium. Pt intubated 5/7-5/9, re-intubated 5/11-5/14 with concern for aspiration PNA and SBO. PMHx: Admit 4/19-4/23 for AMS, HTN, reformed smoker, recently hypoxic and started on home O2, and obesity.   OT comments  Pt making incremental progress with OT goals. She continues to require max A +2 for any OOB mobility. This session she stood in the stedy x5 with max A support and assist to bring her hips forward. Required max-total for ADL's in the stedy. Very participatory throughout session, until seated in the chair, she stared off in space for 10-20 seconds before returning to her typical. Continuing to recommend SNF.    Recommendations for follow up therapy are one component of a multi-disciplinary discharge planning process, led by the attending physician.  Recommendations may be updated based on patient status, additional functional criteria and insurance authorization.    Follow Up Recommendations  Skilled nursing-short term rehab (<3 hours/day)    Assistance Recommended at Discharge Frequent or constant Supervision/Assistance  Patient can return home with the following  Two people to help with walking and/or transfers;Two people to help with bathing/dressing/bathroom;Assistance with cooking/housework;Assistance with feeding;Help with stairs or ramp for entrance;Assist for transportation;Direct supervision/assist for financial management;Direct supervision/assist for medications management   Equipment Recommendations  None recommended by OT    Recommendations for Other Services      Precautions / Restrictions Precautions Precautions: Fall;Other (comment) Precaution Comments: delirium, watch sats Restrictions Weight Bearing  Restrictions: No       Mobility Bed Mobility Overal bed mobility: Needs Assistance Bed Mobility: Supine to Sit     Supine to sit: Mod assist, HOB elevated, +2 for physical assistance     General bed mobility comments: modA+2 for trunk elevation and repositioning hips towards EOB    Transfers Overall transfer level: Needs assistance Equipment used: Ambulation equipment used Transfers: Sit to/from Stand, Bed to chair/wheelchair/BSC Sit to Stand: Max assist, +2 physical assistance           General transfer comment: Able to stand x5 with Stedy but required maxA+2 and max cues for hip extension to swing pad under patient to sit. Standing x 15 seconds max for 1 trial for pericare Transfer via Lift Equipment: Stedy   Balance Overall balance assessment: Needs assistance Sitting-balance support: Feet supported, Bilateral upper extremity supported Sitting balance-Leahy Scale: Fair     Standing balance support: During functional activity Standing balance-Leahy Scale: Zero Standing balance comment: bil UE support with flexed trunk                           ADL either performed or assessed with clinical judgement   ADL Overall ADL's : Needs assistance/impaired     Grooming: Minimal assistance;Cueing for sequencing;Sitting Grooming Details (indicate cue type and reason): increased cuing for sequencing     Lower Body Bathing: Total assistance;Sitting/lateral leans Lower Body Bathing Details (indicate cue type and reason): Completed with pt standing up in steady, unable to maintain standing with assist to clean herself Upper Body Dressing : Maximal assistance;Sitting Upper Body Dressing Details (indicate cue type and reason): changed gown, increased assist due to cogntion     Toilet Transfer: Maximal assistance;+2 for safety/equipment;+2 for physical assistance Toilet Transfer Details (indicate cue type and reason): simulated to chair with  use of Stedy            General ADL Comments: difficulty following commands, increased time needed    Extremity/Trunk Assessment              Vision       Perception     Praxis      Cognition Arousal/Alertness: Awake/alert Behavior During Therapy: Flat affect Overall Cognitive Status: Impaired/Different from baseline Area of Impairment: Orientation, Following commands, Safety/judgement, Problem solving, Attention, Memory, Awareness                 Orientation Level: Disoriented to, Situation Current Attention Level: Focused Memory: Decreased short-term memory Following Commands: Follows one step commands inconsistently, Follows one step commands with increased time Safety/Judgement: Decreased awareness of safety, Decreased awareness of deficits Awareness: Intellectual Problem Solving: Slow processing, Decreased initiation, Difficulty sequencing, Requires verbal cues, Requires tactile cues General Comments: blank staring at end of session but otherwise participatory        Exercises      Shoulder Instructions       General Comments On 1L O2 on arrival, spO2 100%. removed for mobiltiy with spO2 >94% throughout    Pertinent Vitals/ Pain       Pain Assessment Pain Assessment: Faces Faces Pain Scale: Hurts little more Pain Location: R foot Pain Descriptors / Indicators: Discomfort, Grimacing Pain Intervention(s): Monitored during session, Repositioned  Home Living                                          Prior Functioning/Environment              Frequency  Min 2X/week        Progress Toward Goals  OT Goals(current goals can now be found in the care plan section)  Progress towards OT goals: Progressing toward goals  Acute Rehab OT Goals Patient Stated Goal: To get better OT Goal Formulation: With patient/family Time For Goal Achievement: 03/28/22 Potential to Achieve Goals: Good ADL Goals Pt Will Perform Grooming: with min guard  assist;sitting Pt Will Perform Upper Body Dressing: with min assist;sitting Pt Will Perform Lower Body Dressing: with mod assist;sit to/from stand Pt Will Transfer to Toilet: with mod assist;stand pivot transfer;bedside commode Pt Will Perform Toileting - Clothing Manipulation and hygiene: with supervision;sitting/lateral leans;sit to/from stand Additional ADL Goal #1: Tolerate sitting edge of bed unsupported up to 10 min to increase ADL independence Additional ADL Goal #2: Pt will follow single step command accurately 75% of the time in order to improve initiatio with ADL/mobility tasks  Plan Discharge plan remains appropriate    Co-evaluation    PT/OT/SLP Co-Evaluation/Treatment: Yes Reason for Co-Treatment: Complexity of the patient's impairments (multi-system involvement);For patient/therapist safety;To address functional/ADL transfers PT goals addressed during session: Balance;Mobility/safety with mobility;Proper use of DME OT goals addressed during session: ADL's and self-care;Other (comment) (transfers)      AM-PAC OT "6 Clicks" Daily Activity     Outcome Measure   Help from another person eating meals?: A Lot Help from another person taking care of personal grooming?: A Lot Help from another person toileting, which includes using toliet, bedpan, or urinal?: Total Help from another person bathing (including washing, rinsing, drying)?: A Lot Help from another person to put on and taking off regular upper body clothing?: A Lot Help from another person to put on and taking off regular lower body  clothing?: Total 6 Click Score: 10    End of Session Equipment Utilized During Treatment: Gait belt;Other (comment) (stedy)  OT Visit Diagnosis: Unsteadiness on feet (R26.81);Other symptoms and signs involving cognitive function;Other abnormalities of gait and mobility (R26.89);Muscle weakness (generalized) (M62.81);Pain Pain - Right/Left: Right Pain - part of body: Ankle and joints of  foot   Activity Tolerance Patient limited by lethargy   Patient Left in chair;with call bell/phone within reach;with chair alarm set   Nurse Communication Mobility status;Need for lift equipment        Time: [phone] OT Time Calculation (min): 31 min  Charges: OT General Charges $OT Visit: 1 Visit OT Treatments $Self Care/Home Management : 8-22 mins  Janvi Ammar H., OTR/L Acute Rehabilitation  Adryanna Friedt Elane Bing Plume 03/16/2022, 6:32 PM

## 2022-03-16 NOTE — Progress Notes (Signed)
Physical Therapy Treatment Patient Details Name: Tamara Russell MRN: [id number] DOB: Mar 17, 1961 Today's Date: 03/16/2022   History of Present Illness 61 y.o. female admitted 4/24 with AMS. MRI negative; Psychiatry following, pt with hallucinations; +delirium. Pt intubated 5/7-5/9, re-intubated 5/11-5/14 with concern for aspiration PNA and SBO. PMHx: Admit 4/19-4/23 for AMS, HTN, reformed smoker, recently hypoxic and started on home O2, and obesity.    PT Comments    Patient progressing towards physical therapy goals. Patient required maxA+2 for sit to stand with STEDY but required max cues for hip extension to be able place Stedy flaps under her. Able to stand a total of 5 times during session. One instance of blank staring at end of session but quickly resolved. Patient continues to be limited by cognitive deficits. Continue to recommend SNF for ongoing Physical Therapy.       Recommendations for follow up therapy are one component of a multi-disciplinary discharge planning process, led by the attending physician.  Recommendations may be updated based on patient status, additional functional criteria and insurance authorization.  Follow Up Recommendations  Skilled nursing-short term rehab (<3 hours/day)     Assistance Recommended at Discharge Frequent or constant Supervision/Assistance  Patient can return home with the following Two people to help with walking and/or transfers;Two people to help with bathing/dressing/bathroom;Assistance with cooking/housework;Direct supervision/assist for medications management;Direct supervision/assist for financial management;Assist for transportation;Help with stairs or ramp for entrance   Equipment Recommendations  Hospital bed;Wheelchair cushion (measurements PT);Wheelchair (measurements PT)    Recommendations for Other Services       Precautions / Restrictions Precautions Precautions: Fall;Other (comment) Precaution Comments: delirium, watch  sats Restrictions Weight Bearing Restrictions: No     Mobility  Bed Mobility Overal bed mobility: Needs Assistance Bed Mobility: Supine to Sit     Supine to sit: Mod assist, HOB elevated, +2 for physical assistance     General bed mobility comments: modA+2 for trunk elevation and repositioning hips towards EOB    Transfers Overall transfer level: Needs assistance Equipment used: Ambulation equipment used Transfers: Sit to/from Stand, Bed to chair/wheelchair/BSC Sit to Stand: Max assist, +2 physical assistance           General transfer comment: Able to stand x5 with Stedy but required maxA+2 and max cues for hip extension to swing pad under patient to sit. Standing x 15 seconds max for 1 trial for pericare Transfer via Lift Equipment: Stedy  Ambulation/Gait                   Stairs             Wheelchair Mobility    Modified Rankin (Stroke Patients Only)       Balance Overall balance assessment: Needs assistance Sitting-balance support: Feet supported, Bilateral upper extremity supported Sitting balance-Leahy Scale: Fair     Standing balance support: During functional activity Standing balance-Leahy Scale: Zero Standing balance comment: bil UE support with flexed trunk                            Cognition Arousal/Alertness: Awake/alert Behavior During Therapy: Flat affect Overall Cognitive Status: Impaired/Different from baseline Area of Impairment: Orientation, Following commands, Safety/judgement, Problem solving, Attention, Memory, Awareness                 Orientation Level: Disoriented to, Situation Current Attention Level: Focused Memory: Decreased short-term memory Following Commands: Follows one step commands inconsistently, Follows one step commands with  increased time Safety/Judgement: Decreased awareness of safety, Decreased awareness of deficits Awareness: Intellectual Problem Solving: Slow processing,  Decreased initiation, Difficulty sequencing, Requires verbal cues, Requires tactile cues General Comments: blank staring at end of session but otherwise participatory        Exercises      General Comments General comments (skin integrity, edema, etc.): On 1L O2 on arrival, spO2 100%. removed for mobiltiy with spO2 >94% throughout      Pertinent Vitals/Pain Pain Assessment Pain Assessment: Faces Faces Pain Scale: Hurts little more Pain Location: R foot Pain Descriptors / Indicators: Discomfort, Grimacing Pain Intervention(s): Monitored during session, Repositioned    Home Living                          Prior Function            PT Goals (current goals can now be found in the care plan section) Acute Rehab PT Goals Patient Stated Goal: be able to get up and walk PT Goal Formulation: With family Time For Goal Achievement: 03/29/22 Potential to Achieve Goals: Fair Progress towards PT goals: Progressing toward goals    Frequency    Min 3X/week      PT Plan Current plan remains appropriate    Co-evaluation PT/OT/SLP Co-Evaluation/Treatment: Yes Reason for Co-Treatment: For patient/therapist safety;To address functional/ADL transfers PT goals addressed during session: Balance;Mobility/safety with mobility;Proper use of DME        AM-PAC PT "6 Clicks" Mobility   Outcome Measure  Help needed turning from your back to your side while in a flat bed without using bedrails?: Total Help needed moving from lying on your back to sitting on the side of a flat bed without using bedrails?: Total Help needed moving to and from a bed to a chair (including a wheelchair)?: Total Help needed standing up from a chair using your arms (e.g., wheelchair or bedside chair)?: Total Help needed to walk in hospital room?: Total Help needed climbing 3-5 steps with a railing? : Total 6 Click Score: 6    End of Session   Activity Tolerance: Patient tolerated treatment  well Patient left: in chair;with call bell/phone within reach Nurse Communication: Mobility status;Need for lift equipment PT Visit Diagnosis: Other abnormalities of gait and mobility (R26.89);Muscle weakness (generalized) (M62.81);Other symptoms and signs involving the nervous system (J68.115)     Time: 7262-0355 PT Time Calculation (min) (ACUTE ONLY): 31 min  Charges:  $Therapeutic Activity: 8-22 mins                     Dhruvi Crenshaw A. Dan Humphreys PT, DPT Acute Rehabilitation Services Pager [phone] Office [phone]    Viviann Spare 03/16/2022, 5:38 PM

## 2022-03-16 NOTE — Progress Notes (Signed)
Progress Note   Patient: Tamara Russell YOV:[id number] DOB: 09-25-1961 DOA: 02/06/2022     37 DOS: the patient was seen and examined on 03/16/2022   Brief hospital course: Patient is a 61 y.o.  female morbid obesity, HTN, GERD-who was just discharged from this facility on 4/23-presented to the hospital on 4/24 with altered mental status.  Found to have metabolic encephalopathy.  Significant events: 4/19-4/23>> hospitalization for chest pain, confusion - UTI. 4/24 Presented to the ED for confusion-admit to [facility]. 4/28 Neuro eval - marital stress possibly causing AMS - psych consult recommended 5/01 Episode of aspiration - bradycardia/worsening hypoxemia - improved after suctioning.CXR with bibasilar opacities-IV Ancef started. 5/04 Neuro re-eval per psych rec's-?autoimmune process causing AMS 5/05 Trial of Depakote per neurology. 5/06 Significant sedation / lethargy following Haldol/Ativan -concern for hypercarbia -BiPAP started. LP postponed. Haldol/Ativan dosage adjusted by psychiatry. 5/07 Intubated, eeg neg neuro consulted.  5/08 LP completed. Cortrak placed. 5/09 Extubated, continues to have agitated delirium with psychosis, and then episodes of minimal responsiveness 5/12 Intubated in setting SBO and suspected aspiration 5/15 Extubated, respiratory status improving and mental status clearing  05/16 transferred to [facility].   5/19 Completed antibiotic course the day before.  Started on IVIG. 5/23, 5 days IVIG completed, questionable improvement.  5/24 was started on CPAP after first 3 nights of poor compliance, she has slowly started improving, now compliant,  still some waxing and waning mental status but overall better  06/01 patient medically stable, pending transfer to SNF to continue physical therapy.   Assessment and Plan: * Acute metabolic encephalopathy Delirium.  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  Acute on chronic respiratory failure with hypoxemia and hypercapnia.    Workup has included EEG and LP. Autoimmune encephalitis panel is pending, sent to [facility].  Metabolic work-up including TSH and free T4, cortisol level, ammonia, ethanol level, RPR, vitamin B1, vitamin B12, blood lead level, Depakote level not revealing.   Has completed 5 days of IV IG infusion with good toleration.  Slowly improving.  Plan to transfer to rehab. Encourage out of bed to chair tid.   Acute on chronic diastolic CHF (congestive heart failure) (HCC) Echocardiogram with preserved LV systolic function with Ef 60 to 65%. RV systolic function preserved. No significant valvular disease.   Positive troponin elevation due to heart failure. Acute coronary syndrome ruled out.   Her fluid balance since admission is negative 7.738 ml.  Systolic blood pressure 150 mmHg.   Plan to continue metoprolol and add ARB for better blood pressure control.  Add SGLT2i Hold on torsemide for now.     DVT (deep venous thrombosis) (HCC) Left lower extremity DVT.  Acute deep vein thrombosis involving the left TP trunk.   Plan to continue anticoagulation with apixaban with good toleration.  Right knee pain, continue with diclofenac gel.  Continue with bowel regimen.   UTI (urinary tract infection) Urine culture positive for E coli, enterococcus and citrobacter.  Received antibiotic therapy with fosfomycin.   Ileus (HCC) Ruled out small bowel obstruction.  Continue bowel regimen, patient with no nausea or vomiting, tolerating po well.   Aspiration pneumonia [facility]) Patient had multiple episodes of aspiration,  Culture from ET aspiration positive for Klebsiella. Completed 7 days of antibiotic therapy.  Ng tube has been removed and patient is tolerating po well.   PUD (peptic ulcer disease) Blood pressure control with metoprolol and will add irbesartan today.   Dyslipidemia. Continue with atorvastatin.   OSA (obstructive sleep apnea) Continue with Cpap.  Hypertension Continue  blood pressure monitoring.  Continue with metoprolol.   Hypokalemia Hyponatremia.   Today renal function with serum cr at 0,41, K is 3,3 and serum bicarbonate is 40.  Continue K correction with KCl 80 meq in 2 divided doses and follow up renal function in am. Hold on further diuresis for now.   Microcytic anemia Cell count has been stable.   Obesity, Class III, BMI 40-49.9 (morbid obesity) (HCC) Calculated BMI is 41.2         Subjective: Patient is feeling better, her husband is at the bedside, no chest pain or dyspnea, continue to be very weak and deconditioned.   Physical Exam: Vitals:   03/15/22 2300 03/16/22 0311 03/16/22 0355 03/16/22 0815  BP: 129/74 (!) 149/87  (!) 150/88  Pulse: 88 86  93  Resp: 19 (!) 22  18  Temp: 98 F (36.7 C) 98.3 F (36.8 C)  98.5 F (36.9 C)  TempSrc: Axillary Axillary  Oral  SpO2: 93% 96%  97%  Weight:   98.8 kg   Height:       Neurology awake and alert, non focal, mild confusion but not agitation. ENT with no pallor Cardiovascular with S1 and S2 present and rhythmic, no gallops, or murmurs.  Respiratory with no wheezing or rales. Abdomen soft and non tender No lower extremity edema  Data Reviewed:    Family Communication: I spoke with patient's husband at the bedside, we talked in detail about patient's condition, plan of care and prognosis and all questions were addressed.   Disposition: Status is: Inpatient Remains inpatient appropriate because: pending placement   Planned Discharge Destination: Skilled nursing facility     Author: Coralie Keens, MD 03/16/2022 10:48 AM  For on call review [email or web address].

## 2022-03-17 DIAGNOSIS — I5033 Acute on chronic diastolic (congestive) heart failure: Secondary | ICD-10-CM | POA: Diagnosis not present

## 2022-03-17 DIAGNOSIS — R5381 Other malaise: Secondary | ICD-10-CM | POA: Diagnosis not present

## 2022-03-17 DIAGNOSIS — D509 Iron deficiency anemia, unspecified: Secondary | ICD-10-CM | POA: Diagnosis not present

## 2022-03-17 DIAGNOSIS — E876 Hypokalemia: Secondary | ICD-10-CM | POA: Diagnosis not present

## 2022-03-17 DIAGNOSIS — F29 Unspecified psychosis not due to a substance or known physiological condition: Secondary | ICD-10-CM | POA: Diagnosis not present

## 2022-03-17 DIAGNOSIS — D649 Anemia, unspecified: Secondary | ICD-10-CM | POA: Diagnosis not present

## 2022-03-17 DIAGNOSIS — R410 Disorientation, unspecified: Secondary | ICD-10-CM | POA: Diagnosis not present

## 2022-03-17 DIAGNOSIS — J9621 Acute and chronic respiratory failure with hypoxia: Secondary | ICD-10-CM | POA: Diagnosis not present

## 2022-03-17 DIAGNOSIS — K5901 Slow transit constipation: Secondary | ICD-10-CM | POA: Diagnosis not present

## 2022-03-17 DIAGNOSIS — K279 Peptic ulcer, site unspecified, unspecified as acute or chronic, without hemorrhage or perforation: Secondary | ICD-10-CM | POA: Diagnosis not present

## 2022-03-17 DIAGNOSIS — G0481 Other encephalitis and encephalomyelitis: Secondary | ICD-10-CM | POA: Diagnosis not present

## 2022-03-17 DIAGNOSIS — F22 Delusional disorders: Secondary | ICD-10-CM | POA: Diagnosis not present

## 2022-03-17 DIAGNOSIS — K567 Ileus, unspecified: Secondary | ICD-10-CM | POA: Diagnosis not present

## 2022-03-17 DIAGNOSIS — J690 Pneumonitis due to inhalation of food and vomit: Secondary | ICD-10-CM

## 2022-03-17 DIAGNOSIS — N3000 Acute cystitis without hematuria: Secondary | ICD-10-CM | POA: Diagnosis not present

## 2022-03-17 DIAGNOSIS — I82402 Acute embolism and thrombosis of unspecified deep veins of left lower extremity: Secondary | ICD-10-CM | POA: Diagnosis not present

## 2022-03-17 DIAGNOSIS — I82409 Acute embolism and thrombosis of unspecified deep veins of unspecified lower extremity: Secondary | ICD-10-CM | POA: Diagnosis not present

## 2022-03-17 DIAGNOSIS — Z743 Need for continuous supervision: Secondary | ICD-10-CM | POA: Diagnosis not present

## 2022-03-17 DIAGNOSIS — R69 Illness, unspecified: Secondary | ICD-10-CM | POA: Diagnosis not present

## 2022-03-17 DIAGNOSIS — Z87440 Personal history of urinary (tract) infections: Secondary | ICD-10-CM | POA: Diagnosis not present

## 2022-03-17 DIAGNOSIS — Z8711 Personal history of peptic ulcer disease: Secondary | ICD-10-CM | POA: Diagnosis not present

## 2022-03-17 DIAGNOSIS — I5022 Chronic systolic (congestive) heart failure: Secondary | ICD-10-CM | POA: Diagnosis not present

## 2022-03-17 DIAGNOSIS — I824Y2 Acute embolism and thrombosis of unspecified deep veins of left proximal lower extremity: Secondary | ICD-10-CM | POA: Diagnosis not present

## 2022-03-17 DIAGNOSIS — G9341 Metabolic encephalopathy: Secondary | ICD-10-CM | POA: Diagnosis not present

## 2022-03-17 DIAGNOSIS — Z8709 Personal history of other diseases of the respiratory system: Secondary | ICD-10-CM | POA: Diagnosis not present

## 2022-03-17 DIAGNOSIS — G4733 Obstructive sleep apnea (adult) (pediatric): Secondary | ICD-10-CM | POA: Diagnosis not present

## 2022-03-17 DIAGNOSIS — I10 Essential (primary) hypertension: Secondary | ICD-10-CM | POA: Diagnosis not present

## 2022-03-17 LAB — BASIC METABOLIC PANEL
Anion gap: 5 (ref 5–15)
BUN: 7 mg/dL — ABNORMAL LOW (ref 8–23)
CO2: 38 mmol/L — ABNORMAL HIGH (ref 22–32)
Calcium: 8.8 mg/dL — ABNORMAL LOW (ref 8.9–10.3)
Chloride: 91 mmol/L — ABNORMAL LOW (ref 98–111)
Creatinine, Ser: 0.48 mg/dL (ref 0.44–1.00)
GFR, Estimated: >60 mL/min (ref >60)
Glucose, Bld: 92 mg/dL (ref 70–99)
Potassium: 3.5 mmol/L (ref 3.5–5.1)
Sodium: 134 mmol/L — ABNORMAL LOW (ref 135–145)

## 2022-03-17 MED ORDER — IRBESARTAN 75 MG PO TABS: 37.5000 mg | ORAL_TABLET | Freq: Every day | ORAL | 0 refills | Status: DC

## 2022-03-17 MED ORDER — DAPAGLIFLOZIN PROPANEDIOL 10 MG PO TABS
10.0000 mg | ORAL_TABLET | Freq: Every day | ORAL | Status: DC
  Administered 2022-03-17: 10 mg via ORAL
  Filled 2022-03-17: qty 1

## 2022-03-17 MED ORDER — METOPROLOL SUCCINATE ER 25 MG PO TB24: 25.0000 mg | ORAL_TABLET | Freq: Every day | ORAL | 0 refills | Status: AC

## 2022-03-17 MED ORDER — DAPAGLIFLOZIN PROPANEDIOL 10 MG PO TABS: 10.0000 mg | ORAL_TABLET | Freq: Every day | ORAL | 0 refills | Status: DC

## 2022-03-17 MED ORDER — DICLOFENAC SODIUM 1 % EX GEL: 2.0000 g | Freq: Three times a day (TID) | CUTANEOUS | 0 refills | Status: DC

## 2022-03-17 MED ORDER — APIXABAN 5 MG PO TABS
5.0000 mg | ORAL_TABLET | Freq: Two times a day (BID) | ORAL | 0 refills | Status: DC
Start: 2022-03-17 — End: 2022-06-27

## 2022-03-17 NOTE — TOC Transition Note (Signed)
Transition of Care [facility]) - CM/SW Discharge Note   Patient Details  Name: Tamara Russell MRN: [id number] Date of Birth: 12/12/1960  Transition of Care [facility] CM/SW Contact:  Baldemar Lenis, LCSW Phone Number: 03/17/2022, 12:19 PM   Clinical Narrative:   CSW confirmed with Lacinda Axon that auth was received and bed is available today. MD completed discharge, CSW sent to [place]. CSW updated patient's husband at bedside. Transport scheduled with [facility] for next available.  Nurse to call report to [phone], Room 213.    Final next level of care: Skilled Nursing Facility Barriers to Discharge: Barriers Resolved   Patient Goals and CMS Choice Patient states their goals for this hospitalization and ongoing recovery are:: to go home      Discharge Placement              Patient chooses bed at: [facility] Patient to be transferred to facility by: [facility] Name of family member notified: Dorene Sorrow Patient and family notified of of transfer: 03/17/22  Discharge Plan and Services   Discharge Planning Services: CM Consult                                 Social Determinants of Health (SDOH) Interventions     Readmission Risk Interventions     View : No data to display.

## 2022-03-17 NOTE — Progress Notes (Addendum)
This RN has attempted to call report unsuccessfully 3 times!  Left message and call back number with Latoya at the front desk. Will update SW   Report given at 1330

## 2022-03-17 NOTE — Plan of Care (Signed)
  Problem: Health Behavior/Discharge Planning: Goal: Ability to manage health-related needs will improve Outcome: Progressing   Problem: Clinical Measurements: Goal: Ability to maintain clinical measurements within normal limits will improve Outcome: Progressing Goal: Will remain free from infection Outcome: Progressing Goal: Diagnostic test results will improve Outcome: Progressing Goal: Respiratory complications will improve Outcome: Progressing   Problem: Activity: Goal: Risk for activity intolerance will decrease Outcome: Progressing   Problem: Nutrition: Goal: Adequate nutrition will be maintained Outcome: Progressing   Problem: Coping: Goal: Level of anxiety will decrease Outcome: Progressing   Problem: Elimination: Goal: Will not experience complications related to bowel motility Outcome: Progressing Goal: Will not experience complications related to urinary retention Outcome: Progressing   Problem: Pain Managment: Goal: General experience of comfort will improve Outcome: Progressing   Problem: Safety: Goal: Ability to remain free from injury will improve Outcome: Progressing   Problem: Skin Integrity: Goal: Risk for impaired skin integrity will decrease Outcome: Progressing   Problem: Education: Goal: Knowledge of General Education information will improve Description: Including pain rating scale, medication(s)/side effects and non-pharmacologic comfort measures Outcome: Progressing   Problem: Health Behavior/Discharge Planning: Goal: Ability to manage health-related needs will improve Outcome: Progressing   Problem: Clinical Measurements: Goal: Ability to maintain clinical measurements within normal limits will improve Outcome: Progressing Goal: Will remain free from infection Outcome: Progressing Goal: Diagnostic test results will improve Outcome: Progressing Goal: Respiratory complications will improve Outcome: Progressing Goal: Cardiovascular  complication will be avoided Outcome: Progressing   Problem: Activity: Goal: Risk for activity intolerance will decrease Outcome: Progressing   Problem: Nutrition: Goal: Adequate nutrition will be maintained Outcome: Progressing   Problem: Coping: Goal: Level of anxiety will decrease Outcome: Progressing   Problem: Elimination: Goal: Will not experience complications related to bowel motility Outcome: Progressing Goal: Will not experience complications related to urinary retention Outcome: Progressing   Problem: Pain Managment: Goal: General experience of comfort will improve Outcome: Progressing   Problem: Safety: Goal: Ability to remain free from injury will improve Outcome: Progressing   Problem: Skin Integrity: Goal: Risk for impaired skin integrity will decrease Outcome: Progressing   Problem: Urinary Elimination: Goal: Signs and symptoms of infection will decrease Outcome: Progressing   

## 2022-03-17 NOTE — Progress Notes (Signed)
Patient asked to remove her CPAP stating that she's not comfortable with it and it feels suffocating on her. RN adjusted the strap of mask and still doesn't want it. Hooked to oxygen at 2 lpm instead while she is sleeping. Will continue to monitor.

## 2022-03-17 NOTE — Discharge Summary (Signed)
Physician Discharge Summary   Patient: Tamara Russell MRN: [id number] DOB: 31-Jan-1961  Admit date:     02/06/2022  Discharge date: 03/17/22  Discharge Physician: York Ram Ferrah Panagopoulos   PCP: [name], MD   Recommendations at discharge:    Patient has been placed on irbesartan and SGLT2i for heart failure Continue Cpap when sleeping.  Anticoagulation with apixaban for left lower extremity DVT.  Increased metoprolol to 25 mg Discontinue aspirin to decrease risk of bleeding [facility] auto-immune encephalitis panel is pending.   Discharge Diagnoses: Principal Problem:   Acute metabolic encephalopathy Active Problems:   Acute on chronic diastolic CHF (congestive heart failure) (HCC)   DVT (deep venous thrombosis) (HCC)   UTI (urinary tract infection)   Aspiration pneumonia (HCC)   Ileus (HCC)   PUD (peptic ulcer disease)   OSA (obstructive sleep apnea)   Hypertension   Hypokalemia   Microcytic anemia   Obesity, Class III, BMI 40-49.9 (morbid obesity) (HCC)  Resolved Problems:   * No resolved hospital problems. [facility] Course: Patient is a 61 y.o.  female morbid obesity, HTN, GERD-who was just discharged from this facility on 4/23-presented to the hospital on 4/24 with altered mental status.  Found to have metabolic encephalopathy.  Significant events: 4/19-4/23>> hospitalization for chest pain, confusion - UTI. 4/24 Presented to the ED for confusion-admit to [facility]. 4/28 Neuro eval - marital stress possibly causing AMS - psych consult recommended 5/01 Episode of aspiration - bradycardia/worsening hypoxemia - improved after suctioning.CXR with bibasilar opacities-IV Ancef started. 5/04 Neuro re-eval per psych rec's-?autoimmune process causing AMS 5/05 Trial of Depakote per neurology. 5/06 Significant sedation / lethargy following Haldol/Ativan -concern for hypercarbia -BiPAP started. LP postponed. Haldol/Ativan dosage adjusted by psychiatry. 5/07 Intubated, eeg  neg neuro consulted.  5/08 LP completed. Cortrak placed. 5/09 Extubated, continues to have agitated delirium with psychosis, and then episodes of minimal responsiveness 5/12 Intubated in setting SBO and suspected aspiration 5/15 Extubated, respiratory status improving and mental status clearing  05/16 transferred to [facility].   5/19 Completed antibiotic course the day before.  Started on IVIG. 5/23, 5 days IVIG completed.  5/24 was started on CPAP after first 3 nights of poor compliance, she has slowly started improving, now compliant,  still some waxing and waning mental status but overall better  06/01 patient medically stable, pending transfer to SNF to continue physical therapy.   Assessment and Plan: * Acute metabolic encephalopathy Delirium.  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  Acute on chronic respiratory failure with hypoxemia and hypercapnia.   Workup has included EEG and LP. Autoimmune encephalitis panel is pending, sent to [facility].  Metabolic work-up including TSH and free T4, cortisol level, ammonia, ethanol level, RPR, vitamin B1, vitamin B12, blood lead level, Depakote level not revealing.   Has completed 5 days of IV IG infusion with good toleration.   Patient very deconditioned, mentation continue to improve. Plan to transfer to SNF for further physical rehabilitation.  Continue with Cpap at night.    Acute on chronic diastolic CHF (congestive heart failure) (HCC) Echocardiogram with preserved LV systolic function with Ef 60 to 65%. RV systolic function preserved. No significant valvular disease.   Positive troponin elevation due to heart failure. Acute coronary syndrome ruled out.   Patient was placed on diuretic therapy with improvement in volume status.   At the time of her discharge she will continue with ARB and SGLT2i. Holding on diuretic therapy for now.    DVT (deep venous thrombosis) (HCC)  Left lower extremity DVT.  Acute deep vein thrombosis  involving the left TP trunk.   Continue anticoagulation with apixaban with good toleration.  Right knee pain, continue with diclofenac gel.  Continue with bowel regimen.   UTI (urinary tract infection) Urine culture positive for E coli, enterococcus and citrobacter.  Received antibiotic therapy with fosfomycin.   Ileus ([place]) Ruled out small bowel obstruction.  Continue bowel regimen, patient with no nausea or vomiting, tolerating po well.   Aspiration pneumonia [facility]) Patient had multiple episodes of aspiration,  Culture from ET aspiration positive for Klebsiella. Completed 7 days of antibiotic therapy.  Ng tube has been removed and patient is tolerating po well.   PUD (peptic ulcer disease) Blood pressure control with metoprolol and irbesartan.   Dyslipidemia. Continue with atorvastatin.   OSA (obstructive sleep apnea) Continue with Cpap.   Hypertension Continue blood pressure monitoring.  Continue with metoprolol and irbesartan.   Hypokalemia Hyponatremia.   Renal function has been stable, serum cr today is 0,48 with K at 3,5 and serum bicarbonate at 38. Na 134.  Plan to follow up renal function as outpatient, torsemide has been discontinued for now.  Continue blood pressure control.   Microcytic anemia Cell count has been stable.   Obesity, Class III, BMI 40-49.9 (morbid obesity) (HCC) Calculated BMI is 41.2          Consultants: psychiatry, critical care , surgery, neurology  Procedures performed: none   Disposition: Skilled nursing facility Diet recommendation:  Cardiac diet DISCHARGE MEDICATION: Allergies as of 03/17/2022       Reactions   Penicillins Other (See Comments)   Childhood allergy. Pt does not remember reaction Tolerated cefazolin 5/1-5/6, cefepime 5/12-5/14        Medication List     STOP taking these medications    aspirin EC 81 MG tablet   cephALEXin 500 MG capsule Commonly known as: KEFLEX   spironolactone 25 MG  tablet Commonly known as: Aldactone       TAKE these medications    acetaminophen 500 MG tablet Commonly known as: TYLENOL Take 1 tablet (500 mg total) by mouth every 6 (six) hours as needed for moderate pain. Reported on 04/21/2016   apixaban 5 MG Tabs tablet Commonly known as: ELIQUIS Take 1 tablet (5 mg total) by mouth 2 (two) times daily.   atorvastatin 40 MG tablet Commonly known as: LIPITOR Take 1 tablet (40 mg total) by mouth daily.   CENTRUM SILVER ADULT 50+ PO Take 1 tablet by mouth daily.   dapagliflozin propanediol 10 MG Tabs tablet Commonly known as: FARXIGA Take 1 tablet (10 mg total) by mouth daily.   diclofenac Sodium 1 % Gel Commonly known as: VOLTAREN Apply 2 g topically 3 (three) times daily. To bilateral knees as needed.   irbesartan 75 MG tablet Commonly known as: AVAPRO Take 0.5 tablets (37.5 mg total) by mouth daily. Start taking on: March 18, 2022   metoprolol succinate 25 MG 24 hr tablet Commonly known as: TOPROL-XL Take 1 tablet (25 mg total) by mouth daily. Start taking on: March 18, 2022 What changed:  how much to take when to take this        Follow-up Information     Lucianne Lei, MD Follow up in 1 week(s).   Specialty: Family Medicine Contact information: [address] [place] [address] [phone]         [facility] Follow up.   Specialty: Rehabilitation Why: OP  services will call you with appointment. Contact information: [address] [id number] [place] [phone]               Discharge Exam: Filed Weights   03/15/22 0500 03/16/22 0355 03/17/22 0500  Weight: 98.9 kg 98.8 kg 98.2 kg   BP 124/74 (BP Location: Left Arm)   Pulse 81   Temp 98.5 F (36.9 C) (Oral)   Resp 18   Ht 5\' 1"  (1.549 m)   Wt 98.2 kg   SpO2 100%   BMI 40.91 kg/m   Patient with no chest pain or dyspnea.   Neurology awake and alert ENT with no  pallor or icterus Cardiovascular with S1 and S2 present and rhythmic Respiratory with no wheezing Abdomen not distended  Condition at discharge: stable  The results of significant diagnostics from this hospitalization (including imaging, microbiology, ancillary and laboratory) are listed below for reference.   Imaging Studies: DG Ankle 2 Views Right  Result Date: 03/08/2022 CLINICAL DATA:  Right ankle pain. EXAM: RIGHT ANKLE - 2 VIEW COMPARISON:  None available FINDINGS: Moderate lateral malleolar soft tissue swelling. The ankle mortise is symmetric and intact. Joint spaces are preserved. Minimal dorsal navicular-cuneiform degenerative osteophytes. No acute fracture is seen. No dislocation. IMPRESSION: Moderate lateral malleolar soft tissue swelling. No acute fracture is seen. Electronically Signed   By: Yvonne Kendall M.D.   On: 03/08/2022 17:19   DG Abd 1 View  Result Date: 02/26/2022 CLINICAL DATA:  Evaluate NG tube placement EXAM: ABDOMEN - 1 VIEW COMPARISON:  02/26/22 FINDINGS: There is been successful post pyloric placement of the weighted feeding tube. The tip courses along the expected location of the descending duodenum and terminates at the junction between the descending and horizontal portions of the duodenum. IMPRESSION: Successful pyloric placement of weighted feeding tube. Electronically Signed   By: Kerby Moors M.D.   On: 02/26/2022 09:59   DG Abd 1 View  Result Date: 02/24/2022 CLINICAL DATA:  Enteric tube placement. EXAM: ABDOMEN - 1 VIEW COMPARISON:  02/24/2022 at 8:13 a.m. FINDINGS: Larger caliber enteric feeding tube unchanged with tip over the stomach in the left upper quadrant. Interval placement of nasogastric tube which extends through the region of the stomach into the left mid abdomen likely in proximal small bowel. Air present within the colon. No free peritoneal air. No dilated small bowel loops visualized. Mild bibasilar opacification within the lungs suggesting  small effusion/atelectasis. IMPRESSION: 1. Nonobstructive bowel gas pattern. 2. Larger caliber enteric tube with tip over the stomach in the left upper quadrant. Nasogastric tube with tip over the left mid abdomen likely in the proximal small bowel. Electronically Signed   By: Marin Olp M.D.   On: 02/24/2022 09:52   DG Abd 1 View  Result Date: 02/24/2022 CLINICAL DATA:  Endotracheal tube and NG tube placement EXAM: PORTABLE CHEST 1 VIEW PORTABLE ABDOMEN 1 VIEW COMPARISON:  09/26/2022, 5:17 a.m. FINDINGS: Interval placement of endotracheal tube, tip just above the carina, less than 1 cm. Unchanged layering bilateral pleural effusions and associated atelectasis or consolidation. Right upper extremity PICC. Gas-filled, nondistended small bowel and colon throughout the partially included abdomen. Non weighted enteric feeding tube is positioned with tip below the diaphragm within the gastric body. No obvious free air. IMPRESSION: 1. Interval placement of endotracheal tube, tip just above the carina, less than 1 cm. Consider slight retraction to mid tracheal position. 2. Unchanged layering bilateral pleural effusions and associated atelectasis or consolidation. No new  airspace opacity. 3. Non weighted enteric feeding tube is positioned with tip below the diaphragm within the gastric body. Recommend advancement if post pyloric positioning is desired. Electronically Signed   By: Delanna Ahmadi M.D.   On: 02/24/2022 08:33   CT CHEST ABDOMEN PELVIS W CONTRAST  Result Date: 02/24/2022 CLINICAL DATA:  Concern for autoimmune encephalitis. EXAM: CT CHEST, ABDOMEN, AND PELVIS WITH CONTRAST TECHNIQUE: Multidetector CT imaging of the chest, abdomen and pelvis was performed following the standard protocol during bolus administration of intravenous contrast. RADIATION DOSE REDUCTION: This exam was performed according to the departmental dose-optimization program which includes automated exposure control, adjustment of the  mA and/or kV according to patient size and/or use of iterative reconstruction technique. CONTRAST:  175mL OMNIPAQUE IOHEXOL 300 MG/ML  SOLN COMPARISON:  September 11, 2010 FINDINGS: CT CHEST FINDINGS Cardiovascular: A right-sided PICC line is seen. There is mild calcification of the aortic arch, without evidence of aortic aneurysm or dissection. The main pulmonary artery is dilated and measures 4.1 cm. Normal heart size. No pericardial effusion. Mediastinum/Nodes: Endotracheal and nasogastric tubes are in place. No enlarged mediastinal, hilar, or axillary lymph nodes. Thyroid gland, trachea, and esophagus demonstrate no significant findings. Lungs/Pleura: Marked severity areas of airspace disease are seen within the right middle lobe and bilateral lower lobes. There is no evidence of a pleural effusion or pneumothorax. Musculoskeletal: No chest wall mass or suspicious bone lesions identified. CT ABDOMEN PELVIS FINDINGS Hepatobiliary: There is diffuse fatty infiltration of the liver parenchyma. No focal liver abnormality is seen. No gallstones, gallbladder wall thickening, or biliary dilatation. Pancreas: Unremarkable. No pancreatic ductal dilatation or surrounding inflammatory changes. Spleen: A 3.2 cm x 1.8 cm partially calcified area of low attenuation (approximately 50.96 Hounsfield units) is seen within the posteromedial aspect of the spleen. Adrenals/Urinary Tract: The right adrenal gland is normal in appearance. A 17 mm x 11 mm low-attenuation (approximately 50.80 Hounsfield units) left adrenal mass is noted. Kidneys are normal in size, without renal calculi or hydronephrosis. A 2.0 cm x 1.8 cm heterogeneous mixed attenuation right adrenal mass is seen along the anterolateral aspect of the mid to upper right kidney. A Foley catheter is seen within the lumen of an empty urinary bladder. Stomach/Bowel: Stomach is within normal limits. Appendix appears normal. Multiple dilated small bowel loops are seen within the  mid and lower abdomen (maximum small bowel diameter of approximately 3.6 cm). A transition zone is seen within the medial aspect of the mid to lower left abdomen (axial CT images 70 through 81, CT series 3). Vascular/Lymphatic: Aortic atherosclerosis. No enlarged abdominal or pelvic lymph nodes. Reproductive: Status post hysterectomy. No adnexal masses. Other: No abdominal wall hernia or abnormality. No abdominopelvic ascites. Musculoskeletal: No acute or significant osseous findings. IMPRESSION: 1. Marked severity right middle lobe and bilateral lower lobe airspace disease, consistent with multifocal pneumonia. Follow-up to resolution is recommended. 2. Small bowel obstruction with a suspected transition zone within the medial aspect of the mid to lower left abdomen. 3. Heterogeneous mixed attenuation right adrenal mass which may represent an adrenal adenoma. Correlation with adrenal washout CT is recommended. 4. 3.2 cm x 1.8 cm partially calcified splenic cyst versus hemangioma. Correlation with nonemergent splenic ultrasound is recommended. 5. 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 6. Aortic atherosclerosis. 7. Enlarged main pulmonary artery, which can be seen in the setting of pulmonary arterial hypertension. Aortic Atherosclerosis (ICD10-I70.0). Electronically Signed   By: Virgina Norfolk M.D.   On: 02/24/2022 23:10   DG  CHEST PORT 1 VIEW  Result Date: 03/10/2022 CLINICAL DATA:  Shortness of breath, low O2 sats EXAM: PORTABLE CHEST 1 VIEW COMPARISON:  03/02/2022 FINDINGS: Right PICC line tip at the cavoatrial junction, unchanged. Heart is borderline in size. Vascular congestion and bibasilar atelectasis. No effusions or acute bony abnormality. IMPRESSION: Mild vascular congestion, bibasilar atelectasis. Electronically Signed   By: Charlett Nose M.D.   On: 03/10/2022 19:10   DG CHEST PORT 1 VIEW  Result Date: 03/02/2022 CLINICAL DATA:  AMS, SOB EXAM: PORTABLE CHEST 1 VIEW COMPARISON:   02/26/2022 FINDINGS: Mild right basilar atelectasis. No focal consolidation. No pleural effusion or pneumothorax. Heart and mediastinal contours are unremarkable. Right sided PICC line with the tip projecting over the SVC. No acute osseous abnormality. IMPRESSION: No active disease. Electronically Signed   By: Elige Ko M.D.   On: 03/02/2022 11:17   DG CHEST PORT 1 VIEW  Result Date: 02/26/2022 CLINICAL DATA:  Respiratory failure. EXAM: PORTABLE CHEST 1 VIEW COMPARISON:  02/24/2022 FINDINGS: 0545 hours. Low lung volumes. Cardiopericardial silhouette is at upper limits of normal for size. Retrocardiac left base collapse/consolidation with small left pleural effusion is similar to prior. Improved aeration at the right base with minimal subsegmental atelectasis visible today. Endotracheal tube tip is 4.3 cm above the base of the carina right PICC line tip overlies the mid SVC level. The feeding tube tip is positioned in the gastric fundus. The NG tube passes into the stomach although the distal tip position is not included on the film. Telemetry leads overlie the chest. IMPRESSION: 1. Stable retrocardiac left base collapse/consolidation with small left pleural effusion. 2. Improved aeration at the right base with minimal subsegmental atelectasis visible today. Electronically Signed   By: Kennith Center M.D.   On: 02/26/2022 08:56   DG CHEST PORT 1 VIEW  Result Date: 02/24/2022 CLINICAL DATA:  Endotracheal tube and NG tube placement EXAM: PORTABLE CHEST 1 VIEW PORTABLE ABDOMEN 1 VIEW COMPARISON:  09/26/2022, 5:17 a.m. FINDINGS: Interval placement of endotracheal tube, tip just above the carina, less than 1 cm. Unchanged layering bilateral pleural effusions and associated atelectasis or consolidation. Right upper extremity PICC. Gas-filled, nondistended small bowel and colon throughout the partially included abdomen. Non weighted enteric feeding tube is positioned with tip below the diaphragm within the gastric  body. No obvious free air. IMPRESSION: 1. Interval placement of endotracheal tube, tip just above the carina, less than 1 cm. Consider slight retraction to mid tracheal position. 2. Unchanged layering bilateral pleural effusions and associated atelectasis or consolidation. No new airspace opacity. 3. Non weighted enteric feeding tube is positioned with tip below the diaphragm within the gastric body. Recommend advancement if post pyloric positioning is desired. Electronically Signed   By: Jearld Lesch M.D.   On: 02/24/2022 08:33   DG CHEST PORT 1 VIEW  Result Date: 02/24/2022 CLINICAL DATA:  Shortness of breath. EXAM: PORTABLE CHEST 1 VIEW COMPARISON:  02/18/2022 FINDINGS: There is a feeding tube with tip coursing below the level of the GE junction and projecting over the lower abdomen, likely within the body of a diffusely distended stomach. Stable cardiac enlargement. The lung volumes are low. Bilateral pleural effusions are identified with mild interstitial edema. Decreased aeration of both lung bases, likely reflecting atelectasis and or airspace disease. IMPRESSION: 1. Suspect CHF. 2. Decreased aeration of both lung bases, compatible with atelectasis and/or airspace disease. 3. Feeding tube tip is in the body of a diffusely distended stomach. Electronically Signed   By: Ladona Ridgel  Clovis Riley M.D.   On: 02/24/2022 05:37   Portable Chest x-ray  Result Date: 02/18/2022 CLINICAL DATA:  Intubated, altered mental status. EXAM: PORTABLE CHEST 1 VIEW COMPARISON:  Chest x-ray 02/13/2022 FINDINGS: There is a new endotracheal tube with distal tip 2 cm above the carina. The heart is enlarged, unchanged. There is improved aeration throughout both lungs. There is minimal bibasilar atelectasis. No pleural effusion or pneumothorax. No acute fractures. IMPRESSION: 1. Endotracheal tube tip 2 cm above carina. 2. Improved aeration throughout both lungs with minimal persistent bibasilar atelectasis. 3. Stable cardiomegaly.  Electronically Signed   By: Ronney Asters M.D.   On: 02/18/2022 21:38   DG Knee Right Port  Result Date: 03/07/2022 CLINICAL DATA:  Right knee pain. EXAM: PORTABLE RIGHT KNEE - 1-2 VIEW COMPARISON:  Right femur radiographs 08/08/2013 FINDINGS: Moderate medial compartment joint space narrowing and mild peripheral degenerative osteophytosis. Mild peripheral lateral compartment degenerative osteophytosis. Mild patellofemoral joint space narrowing and peripheral osteophytosis. Mild chronic enthesopathic change at the quadriceps insertion on the patella, similar to prior. No acute fracture is seen. No dislocation. No joint effusion. 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 Electronically Signed   By: Yvonne Kendall M.D.   On: 03/07/2022 09:53   DG Abd Portable 1V  Result Date: 02/26/2022 CLINICAL DATA:  Small bowel obstruction. EXAM: PORTABLE ABDOMEN - 1 VIEW COMPARISON:  02/24/2022 FINDINGS: Feeding tube and NG tube are looped in the stomach. Mild gaseous distention of small bowel has decreased in the interval. Volume of colonic gas has also decreased. IMPRESSION: Interval decrease in gaseous distention of small bowel and colon. Electronically Signed   By: Misty Stanley M.D.   On: 02/26/2022 08:57   DG Abd Portable 1V  Result Date: 02/24/2022 CLINICAL DATA:  Feeding tube placement EXAM: PORTABLE ABDOMEN - 1 VIEW COMPARISON:  Radiograph 02/24/2022 FINDINGS: Weighted tip enteric tube tip overlies the region of the gastric fundus, advanced since the prior exam. Unchanged position of the nasogastric tube with tip overlying the left mid abdomen. Unchanged gaseous distension of small and large bowel. IMPRESSION: Feeding tube tip overlies the left upper quadrant in the region of the gastric fundus. Unchanged nasogastric tube position with tip overlying the left mid abdomen. Unchanged gaseous distention of  small and large bowel. Electronically Signed   By: Maurine Simmering M.D.   On: 02/24/2022 13:22   DG Abd Portable 1V  Result Date: 02/20/2022 CLINICAL DATA:  Feeding tube placement. EXAM: PORTABLE ABDOMEN - 1 VIEW COMPARISON:  None Available. FINDINGS: Feeding tube tip appears to be directed posteriorly, indicative of placement in the duodenal bulb. Mild gaseous distention of visualized colon. Bibasilar volume loss with bilateral pleural effusions, incidentally imaged. IMPRESSION: 1. Feeding tube tip is likely within the duodenal bulb. 2. Mild gaseous distension of colon, incompletely visualized. 3. Bibasilar volume loss and bilateral pleural effusions. Electronically Signed   By: Lorin Picket M.D.   On: 02/20/2022 11:24   EEG adult  Result Date: 03/03/2022 Lora Havens, MD     03/03/2022 10:58 PM Patient Name: Sher Colaw MRN: [id number] Epilepsy Attending: Lora Havens Referring Physician/Provider: Kerney Elbe, MD Date: 03/03/2022 Duration: 24.39 mins Patient history: 61yo F with ams. EEG to evaluate for seizure Level of alertness: Awake AEDs during EEG study: None Technical aspects: This EEG study was done with scalp electrodes positioned according to the 10-20 International system of electrode placement. Electrical activity was acquired at a  sampling rate of 500Hz  and reviewed with a high frequency filter of 70Hz  and a low frequency filter of 1Hz . EEG data were recorded continuously and digitally stored. Description: No clear posterior dominant rhythm was seen. EEG showed continuous generalized 5 to 7 Hz theta slowing admixed with intermittent generalized 2-3Hz  delta slowing. Hyperventilation and photic stimulation were not performed.   ABNORMALITY - Continuous slow, generalized IMPRESSION: This study is suggestive of moderate diffuse encephalopathy, nonspecific etiology. No seizures or epileptiform discharges were seen throughout the recording. Lora Havens   EEG adult  Result Date:  02/19/2022 Lora Havens, MD     02/19/2022  8:47 AM Patient Name: Miquel Oveson MRN: [id number] Epilepsy Attending: Lora Havens Referring Physician/Provider: Greta Doom, MD Date: 02/19/2022 Duration: 21.42 mins Patient history:  61 year old female with acute psychosis/delirium with little previous psychiatric history. EEG to evaluate for seizure Level of alertness: lethargic AEDs during EEG study: None Technical aspects: This EEG study was done with scalp electrodes positioned according to the 10-20 International system of electrode placement. Electrical activity was acquired at a sampling rate of 500Hz  and reviewed with a high frequency filter of 70Hz  and a low frequency filter of 1Hz . EEG data were recorded continuously and digitally stored. Description: EEG showed continuous generalized 3 to 6 Hz theta-delta slowing. Hyperventilation and photic stimulation were not performed.   ABNORMALITY - Continuous slow, generalized IMPRESSION: This study is suggestive of moderate to severe diffuse encephalopathy, nonspecific etiology. No seizures or epileptiform discharges were seen throughout the recording. Priyanka O Yadav   VAS [place] ABI WITH/WO TBI  Result Date: 03/03/2022  LOWER EXTREMITY DOPPLER STUDY Patient Name:  Trese Jaegers  Date of Exam:   03/03/2022 Medical Rec #: [id number]            Accession #:    [id number] Date of Birth: 1961/05/23            Patient Gender: F Patient Age:   61 years Exam Location:  [facility] Procedure:      VAS [place] ABI WITH/WO TBI Referring Phys: PRANAV PATEL --------------------------------------------------------------------------------  Indications: Rest pain. High Risk Factors: Hypertension.  Limitations: Today's exam was limited due to Restricted right arm. Performing Technologist: Carlos Levering RVT  Examination Guidelines: A complete evaluation includes at minimum, Doppler waveform signals and systolic blood pressure reading at the level of  bilateral brachial, anterior tibial, and posterior tibial arteries, when vessel segments are accessible. Bilateral testing is considered an integral part of a complete examination. Photoelectric Plethysmograph (PPG) waveforms and toe systolic pressure readings are included as required and additional duplex testing as needed. Limited examinations for reoccurring indications may be performed as noted.  ABI Findings: +--------+------------------+-----+---------+--------------+ Right   Rt Pressure (mmHg)IndexWaveform Comment        +--------+------------------+-----+---------+--------------+ Brachial                                Restricted arm +--------+------------------+-----+---------+--------------+ PTA     151               1.13 triphasic               +--------+------------------+-----+---------+--------------+ DP      160               1.19 triphasic               +--------+------------------+-----+---------+--------------+ +--------+------------------+-----+---------+-------+ Left    Lt Pressure (mmHg)IndexWaveform Comment +--------+------------------+-----+---------+-------+  [id number]                    triphasic        +--------+------------------+-----+---------+-------+ PTA     145               1.08 triphasic        +--------+------------------+-----+---------+-------+ DP      160               1.19 triphasic        +--------+------------------+-----+---------+-------+ +-------+-----------+-----------+------------+------------+ ABI/TBIToday's ABIToday's TBIPrevious ABIPrevious TBI +-------+-----------+-----------+------------+------------+ Right  1.19                                           +-------+-----------+-----------+------------+------------+ Left   1.19                                           +-------+-----------+-----------+------------+------------+  Summary: Right: Resting right ankle-brachial index is within normal range.  No evidence of significant right lower extremity arterial disease. Left: Resting left ankle-brachial index is within normal range. No evidence of significant left lower extremity arterial disease. *See table(s) above for measurements and observations.  Electronically signed by Jamelle Haring on 03/03/2022 at 5:18:41 PM.    Final    VAS [place] LOWER EXTREMITY VENOUS (DVT)  Result Date: 03/06/2022  Lower Venous DVT Study Patient Name:  VIEVA ALVA  Date of Exam:   03/05/2022 Medical Rec #: [id number]            Accession #:    [id number] Date of Birth: 09/14/1961            Patient Gender: F Patient Age:   61 years Exam Location:  [facility] Procedure:      VAS [place] LOWER EXTREMITY VENOUS (DVT) Referring Phys: PRANAV PATEL --------------------------------------------------------------------------------  Indications: Edema.  Risk Factors: Immobility (in hospital for > a month). Small bowel obstruction, altered mental status,. Limitations: Body habitus. Comparison Study: No prior study on file Performing Technologist: Sharion Dove RVS  Examination Guidelines: A complete evaluation includes B-mode imaging, spectral Doppler, color Doppler, and power Doppler as needed of all accessible portions of each vessel. Bilateral testing is considered an integral part of a complete examination. Limited examinations for reoccurring indications may be performed as noted. The reflux portion of the exam is performed with the patient in reverse Trendelenburg.  +---------+---------------+---------+-----------+---------------+--------------+ RIGHT    CompressibilityPhasicitySpontaneityProperties     Thrombus Aging +---------+---------------+---------+-----------+---------------+--------------+ CFV      Full                               pulsatile                                                                 waveforms                      +---------+---------------+---------+-----------+---------------+--------------+ SFJ      Full                                                             +---------+---------------+---------+-----------+---------------+--------------+  FV Prox  Full                                                             +---------+---------------+---------+-----------+---------------+--------------+ FV Mid   Full                                                             +---------+---------------+---------+-----------+---------------+--------------+ FV DistalFull                                                             +---------+---------------+---------+-----------+---------------+--------------+ PFV      Full                                                             +---------+---------------+---------+-----------+---------------+--------------+ POP                                         pulsatile      patent by                                                  waveforms      color and                                                                 Doppler        +---------+---------------+---------+-----------+---------------+--------------+ PTV      Full                                                             +---------+---------------+---------+-----------+---------------+--------------+ PERO                                                       Not well  visualized     +---------+---------------+---------+-----------+---------------+--------------+   +--------+---------------+---------+-----------+----------------+-------------+ LEFT    CompressibilityPhasicitySpontaneityProperties      Thrombus                                                                 Aging         +--------+---------------+---------+-----------+----------------+-------------+ CFV      Full                               pulsatile                                                                waveforms                     +--------+---------------+---------+-----------+----------------+-------------+ SFJ     Full                                                             +--------+---------------+---------+-----------+----------------+-------------+ FV Prox Full                                                             +--------+---------------+---------+-----------+----------------+-------------+ FV Mid  Full                                                             +--------+---------------+---------+-----------+----------------+-------------+ FV      Full                                                             Distal                                                                   +--------+---------------+---------+-----------+----------------+-------------+ PFV     Full                                                             +--------+---------------+---------+-----------+----------------+-------------+  POP                                        pulsatile                                                                waveforms                     +--------+---------------+---------+-----------+----------------+-------------+ PTV     Full                                                             +--------+---------------+---------+-----------+----------------+-------------+ PERO    None           No       No                         Acute         +--------+---------------+---------+-----------+----------------+-------------+ TP trunkNone                                               Acute         +--------+---------------+---------+-----------+----------------+-------------+     Summary: RIGHT: - There is no evidence of deep vein thrombosis in the lower extremity. However, portions of this  examination were limited- see technologist comments above.  pulsatile waveforms suggestive of fluid overload  LEFT: - Findings consistent with acute deep vein thrombosis involving the TP Trunk. Pulsatile waveforms suggestive of fluid overload.  *See table(s) above for measurements and observations. Electronically signed by Monica Martinez MD on 03/06/2022 at 5:13:08 PM.    Final    [place] EKG SITE RITE  Result Date: 02/21/2022 If Site Rite image not attached, placement could not be confirmed due to current cardiac rhythm.  DG FL GUIDED LUMBAR PUNCTURE  Result Date: 02/20/2022 CLINICAL DATA:  61 year old female admitted with acute encephalopathy. Request made for lumbar puncture. EXAM: LUMBAR PUNCTURE UNDER FLUOROSCOPY PROCEDURE: An appropriate skin entry site was determined fluoroscopically. Operator donned sterile gloves and mask. Skin site was marked, then prepped with Betadine, draped in usual sterile fashion, and infiltrated locally with 1% lidocaine. A 20 gauge spinal needle advanced into the thecal sac at L4-L5 from a right interlaminar approach. Clear colorless CSF spontaneously returned, with opening pressure of 17 cm water. 9 ml CSF were collected and divided among 4 sterile vials for the requested laboratory studies. The needle was then removed. The patient tolerated the procedure well and there were no complications. FLUOROSCOPY: Radiation Exposure Index (as provided by the fluoroscopic device): 2.8 mGy Kerma IMPRESSION: Technically successful lumbar puncture under fluoroscopy. This exam was performed by Brynda Greathouse PA-C, and was supervised and interpreted by Lorin Picket, MD. Electronically Signed   By: Lorin Picket M.D.   On: 02/20/2022 16:02    Microbiology: Results for orders  placed or performed during the hospital encounter of 02/06/22  Urine Culture     Status: Abnormal   Collection Time: 02/06/22  8:05 AM   Specimen: Urine, Clean Catch  Result Value Ref Range Status   Specimen  Description URINE, CLEAN CATCH  Final   Special Requests   Final    NONE Performed at [facility], [address], [place], [place] [address]    Culture 40,000 COLONIES/mL ENTEROCOCCUS FAECALIS (A)  Final   Report Status 02/08/2022 FINAL  Final   Organism ID, Bacteria ENTEROCOCCUS FAECALIS (A)  Final      Susceptibility   Enterococcus faecalis - MIC*    AMPICILLIN <=2 SENSITIVE Sensitive     NITROFURANTOIN <=16 SENSITIVE Sensitive     VANCOMYCIN 2 SENSITIVE Sensitive     * 40,000 COLONIES/mL ENTEROCOCCUS FAECALIS  Resp Panel by RT-PCR (Flu A&B, Covid) Nasopharyngeal Swab     Status: None   Collection Time: 02/06/22  8:06 AM   Specimen: Nasopharyngeal Swab; Nasopharyngeal(NP) swabs in vial transport medium  Result Value Ref Range Status   SARS Coronavirus 2 by RT PCR NEGATIVE NEGATIVE Final    Comment: (NOTE) SARS-CoV-2 target nucleic acids are NOT DETECTED.  The SARS-CoV-2 RNA is generally detectable in upper respiratory specimens during the acute phase of infection. The lowest concentration of SARS-CoV-2 viral copies this assay can detect is 138 copies/mL. A negative result does not preclude SARS-Cov-2 infection and should not be used as the sole basis for treatment or other patient management decisions. A negative result may occur with  improper specimen collection/handling, submission of specimen other than nasopharyngeal swab, presence of viral mutation(s) within the areas targeted by this assay, and inadequate number of viral copies(<138 copies/mL). A negative result must be combined with clinical observations, patient history, and epidemiological information. The expected result is Negative.  Fact Sheet for Patients:  EntrepreneurPulse.com.au  Fact Sheet for Healthcare Providers:  IncredibleEmployment.be  This test is no t yet approved or cleared by the [place] FDA and  has been authorized for detection and/or diagnosis  of SARS-CoV-2 by FDA under an Emergency Use Authorization (EUA). This EUA will remain  in effect (meaning this test can be used) for the duration of the COVID-19 declaration under Section 564(b)(1) of the Act, 21 U.S.C.section 360bbb-3(b)(1), unless the authorization is terminated  or revoked sooner.       Influenza A by PCR NEGATIVE NEGATIVE Final   Influenza B by PCR NEGATIVE NEGATIVE Final    Comment: (NOTE) The Xpert Xpress SARS-CoV-2/FLU/RSV plus assay is intended as an aid in the diagnosis of influenza from Nasopharyngeal swab specimens and should not be used as a sole basis for treatment. Nasal washings and aspirates are unacceptable for Xpert Xpress SARS-CoV-2/FLU/RSV testing.  Fact Sheet for Patients: EntrepreneurPulse.com.au  Fact Sheet for Healthcare Providers: IncredibleEmployment.be  This test is not yet approved or cleared by the [place] FDA and has been authorized for detection and/or diagnosis of SARS-CoV-2 by FDA under an Emergency Use Authorization (EUA). This EUA will remain in effect (meaning this test can be used) for the duration of the COVID-19 declaration under Section 564(b)(1) of the Act, 21 U.S.C. section 360bbb-3(b)(1), unless the authorization is terminated or revoked.  Performed at [facility], [place] [address] [place], [place] [address]   Culture, blood (single) w Reflex to ID Panel     Status: None   Collection Time: 02/06/22  2:25 PM   Specimen: BLOOD  Result  Value Ref Range Status   Specimen Description BLOOD RIGHT ANTECUBITAL  Final   Special Requests   Final    BOTTLES DRAWN AEROBIC AND ANAEROBIC Blood Culture adequate volume   Culture   Final    NO GROWTH 5 DAYS Performed at [facility], [address] [place], [place] [address]    Report Status 02/11/2022 FINAL  Final  CSF culture w Gram Stain     Status: None   Collection Time: 02/20/22  2:26 PM   Specimen: PATH Cytology CSF;  Cerebrospinal Fluid  Result Value Ref Range Status   Specimen Description CSF  Final   Special Requests NONE  Final   Gram Stain   Final    WBC PRESENT, PREDOMINANTLY MONONUCLEAR NO ORGANISMS SEEN CYTOSPIN SMEAR    Culture   Final    NO GROWTH 3 DAYS Performed at [facility], [place] [address], [place], [place] [address]    Report Status 02/23/2022 FINAL  Final  Culture, Respiratory w Gram Stain     Status: None   Collection Time: 02/24/22  9:20 AM   Specimen: Tracheal Aspirate; Respiratory  Result Value Ref Range Status   Specimen Description TRACHEAL ASPIRATE  Final   Special Requests NONE  Final   Gram Stain   Final    ABUNDANT WBC PRESENT, PREDOMINANTLY PMN FEW GRAM VARIABLE ROD RARE GRAM POSITIVE COCCI    Culture   Final    ABUNDANT KLEBSIELLA PNEUMONIAE NO STAPHYLOCOCCUS AUREUS ISOLATED No Pseudomonas species isolated Performed at [facility], [address] [place], [place] [address]    Report Status 02/26/2022 FINAL  Final   Organism ID, Bacteria KLEBSIELLA PNEUMONIAE  Final      Susceptibility   Klebsiella pneumoniae - MIC*    AMPICILLIN >=32 RESISTANT Resistant     CEFAZOLIN <=4 SENSITIVE Sensitive     CEFEPIME <=0.12 SENSITIVE Sensitive     CEFTAZIDIME <=1 SENSITIVE Sensitive     CEFTRIAXONE <=0.25 SENSITIVE Sensitive     CIPROFLOXACIN <=0.25 SENSITIVE Sensitive     GENTAMICIN <=1 SENSITIVE Sensitive     IMIPENEM <=0.25 SENSITIVE Sensitive     TRIMETH/SULFA <=20 SENSITIVE Sensitive     AMPICILLIN/SULBACTAM 8 SENSITIVE Sensitive     PIP/TAZO <=4 SENSITIVE Sensitive     * ABUNDANT KLEBSIELLA PNEUMONIAE  MRSA Next Gen by PCR, Nasal     Status: None   Collection Time: 02/24/22 12:45 PM   Specimen: Nasal Mucosa; Nasal Swab  Result Value Ref Range Status   MRSA by PCR Next Gen NOT DETECTED NOT DETECTED Final    Comment: (NOTE) The GeneXpert MRSA Assay (FDA approved for NASAL specimens only), is one component of a comprehensive MRSA colonization  surveillance program. It is not intended to diagnose MRSA infection nor to guide or monitor treatment for MRSA infections. Test performance is not FDA approved in patients less than 62 years old. Performed at [facility], [place] [address] [place], [place] [address]     Labs: CBC: Recent Labs  Lab 03/11/22 0557 03/13/22 0600 03/14/22 0442  WBC 5.8 6.4 7.4  HGB 9.2* 9.8* 9.3*  HCT 30.8* 32.3* 30.6*  MCV 78.8* 78.0* 77.7*  PLT 187 198 [id number]   Basic Metabolic Panel: Recent Labs  Lab 03/13/22 0600 03/14/22 0442 03/15/22 0430 03/16/22 0358 03/17/22 0325  NA 129* 131* 134* 136 134*  K 3.8 3.8 3.2* 3.3* 3.5  CL 88* 91* 90* 90* 91*  CO2 34* 34* 39*  40* 38*  GLUCOSE 98 96 91 94 92  BUN 7* 10 8 8  7*  CREATININE 0.41* 0.44 0.41* 0.41* 0.48  CALCIUM 8.9 8.8* 8.9 8.7* 8.8*   Liver Function Tests: Recent Labs  Lab 03/14/22 0442  AST 21  ALT 13  ALKPHOS 50  BILITOT 0.4  PROT 6.4*  ALBUMIN 2.0*   CBG: Recent Labs  Lab 03/10/22 1528 03/11/22 0732  GLUCAP 123* 102*    Discharge time spent: greater than 30 minutes.  Signed: Tawni Millers, MD [facility] 03/17/2022

## 2022-03-21 DIAGNOSIS — R69 Illness, unspecified: Secondary | ICD-10-CM | POA: Diagnosis not present

## 2022-03-21 DIAGNOSIS — G9341 Metabolic encephalopathy: Secondary | ICD-10-CM | POA: Diagnosis not present

## 2022-03-21 DIAGNOSIS — J9621 Acute and chronic respiratory failure with hypoxia: Secondary | ICD-10-CM | POA: Diagnosis not present

## 2022-03-21 DIAGNOSIS — I82402 Acute embolism and thrombosis of unspecified deep veins of left lower extremity: Secondary | ICD-10-CM | POA: Diagnosis not present

## 2022-03-21 DIAGNOSIS — I10 Essential (primary) hypertension: Secondary | ICD-10-CM | POA: Diagnosis not present

## 2022-03-21 DIAGNOSIS — I5022 Chronic systolic (congestive) heart failure: Secondary | ICD-10-CM | POA: Diagnosis not present

## 2022-03-21 DIAGNOSIS — K5901 Slow transit constipation: Secondary | ICD-10-CM | POA: Diagnosis not present

## 2022-03-21 DIAGNOSIS — G4733 Obstructive sleep apnea (adult) (pediatric): Secondary | ICD-10-CM | POA: Diagnosis not present

## 2022-03-27 ENCOUNTER — Telehealth: Payer: Self-pay | Admitting: Student

## 2022-03-27 DIAGNOSIS — F22 Delusional disorders: Secondary | ICD-10-CM | POA: Diagnosis not present

## 2022-03-27 DIAGNOSIS — Z8709 Personal history of other diseases of the respiratory system: Secondary | ICD-10-CM | POA: Diagnosis not present

## 2022-03-27 DIAGNOSIS — K279 Peptic ulcer, site unspecified, unspecified as acute or chronic, without hemorrhage or perforation: Secondary | ICD-10-CM | POA: Diagnosis not present

## 2022-03-27 DIAGNOSIS — R5381 Other malaise: Secondary | ICD-10-CM | POA: Diagnosis not present

## 2022-03-27 DIAGNOSIS — G4733 Obstructive sleep apnea (adult) (pediatric): Secondary | ICD-10-CM | POA: Diagnosis not present

## 2022-03-27 DIAGNOSIS — I5022 Chronic systolic (congestive) heart failure: Secondary | ICD-10-CM | POA: Diagnosis not present

## 2022-03-27 DIAGNOSIS — D509 Iron deficiency anemia, unspecified: Secondary | ICD-10-CM | POA: Diagnosis not present

## 2022-03-27 DIAGNOSIS — F29 Unspecified psychosis not due to a substance or known physiological condition: Secondary | ICD-10-CM | POA: Diagnosis not present

## 2022-03-27 DIAGNOSIS — I82402 Acute embolism and thrombosis of unspecified deep veins of left lower extremity: Secondary | ICD-10-CM | POA: Diagnosis not present

## 2022-03-27 DIAGNOSIS — I10 Essential (primary) hypertension: Secondary | ICD-10-CM | POA: Diagnosis not present

## 2022-03-27 DIAGNOSIS — G0481 Other encephalitis and encephalomyelitis: Secondary | ICD-10-CM | POA: Diagnosis not present

## 2022-03-27 NOTE — Telephone Encounter (Signed)
LVM for patient to follow up on previously recommended placement of cardiac monitor. Requested that patient call the office back to discuss further.

## 2022-04-03 DIAGNOSIS — G0481 Other encephalitis and encephalomyelitis: Secondary | ICD-10-CM | POA: Diagnosis not present

## 2022-04-03 DIAGNOSIS — I5033 Acute on chronic diastolic (congestive) heart failure: Secondary | ICD-10-CM | POA: Diagnosis not present

## 2022-04-03 DIAGNOSIS — Z8709 Personal history of other diseases of the respiratory system: Secondary | ICD-10-CM | POA: Diagnosis not present

## 2022-04-03 DIAGNOSIS — R5381 Other malaise: Secondary | ICD-10-CM | POA: Diagnosis not present

## 2022-04-03 DIAGNOSIS — I10 Essential (primary) hypertension: Secondary | ICD-10-CM | POA: Diagnosis not present

## 2022-04-03 DIAGNOSIS — R69 Illness, unspecified: Secondary | ICD-10-CM | POA: Diagnosis not present

## 2022-04-03 DIAGNOSIS — I82402 Acute embolism and thrombosis of unspecified deep veins of left lower extremity: Secondary | ICD-10-CM | POA: Diagnosis not present

## 2022-04-03 DIAGNOSIS — I5022 Chronic systolic (congestive) heart failure: Secondary | ICD-10-CM | POA: Diagnosis not present

## 2022-04-03 DIAGNOSIS — G4733 Obstructive sleep apnea (adult) (pediatric): Secondary | ICD-10-CM | POA: Diagnosis not present

## 2022-04-03 NOTE — Telephone Encounter (Signed)
Error

## 2022-04-06 DIAGNOSIS — I10 Essential (primary) hypertension: Secondary | ICD-10-CM | POA: Diagnosis not present

## 2022-04-06 DIAGNOSIS — E6609 Other obesity due to excess calories: Secondary | ICD-10-CM | POA: Diagnosis not present

## 2022-04-06 DIAGNOSIS — I739 Peripheral vascular disease, unspecified: Secondary | ICD-10-CM | POA: Diagnosis not present

## 2022-04-06 DIAGNOSIS — R69 Illness, unspecified: Secondary | ICD-10-CM | POA: Diagnosis not present

## 2022-04-07 ENCOUNTER — Other Ambulatory Visit: Payer: Self-pay | Admitting: Family Medicine

## 2022-04-07 DIAGNOSIS — I5033 Acute on chronic diastolic (congestive) heart failure: Secondary | ICD-10-CM | POA: Diagnosis not present

## 2022-04-07 DIAGNOSIS — I82409 Acute embolism and thrombosis of unspecified deep veins of unspecified lower extremity: Secondary | ICD-10-CM | POA: Diagnosis not present

## 2022-04-07 DIAGNOSIS — R2240 Localized swelling, mass and lump, unspecified lower limb: Secondary | ICD-10-CM

## 2022-04-07 DIAGNOSIS — D751 Secondary polycythemia: Secondary | ICD-10-CM | POA: Diagnosis not present

## 2022-04-07 DIAGNOSIS — G4733 Obstructive sleep apnea (adult) (pediatric): Secondary | ICD-10-CM | POA: Diagnosis not present

## 2022-04-07 DIAGNOSIS — K567 Ileus, unspecified: Secondary | ICD-10-CM | POA: Diagnosis not present

## 2022-04-07 DIAGNOSIS — J9601 Acute respiratory failure with hypoxia: Secondary | ICD-10-CM | POA: Diagnosis not present

## 2022-04-07 DIAGNOSIS — G9341 Metabolic encephalopathy: Secondary | ICD-10-CM | POA: Diagnosis not present

## 2022-04-07 DIAGNOSIS — D649 Anemia, unspecified: Secondary | ICD-10-CM | POA: Diagnosis not present

## 2022-04-07 DIAGNOSIS — J9602 Acute respiratory failure with hypercapnia: Secondary | ICD-10-CM | POA: Diagnosis not present

## 2022-04-07 DIAGNOSIS — I110 Hypertensive heart disease with heart failure: Secondary | ICD-10-CM | POA: Diagnosis not present

## 2022-04-07 DIAGNOSIS — E876 Hypokalemia: Secondary | ICD-10-CM | POA: Diagnosis not present

## 2022-04-07 DIAGNOSIS — K219 Gastro-esophageal reflux disease without esophagitis: Secondary | ICD-10-CM | POA: Diagnosis not present

## 2022-04-08 DIAGNOSIS — D751 Secondary polycythemia: Secondary | ICD-10-CM | POA: Diagnosis not present

## 2022-04-08 DIAGNOSIS — J9601 Acute respiratory failure with hypoxia: Secondary | ICD-10-CM | POA: Diagnosis not present

## 2022-04-08 DIAGNOSIS — K567 Ileus, unspecified: Secondary | ICD-10-CM | POA: Diagnosis not present

## 2022-04-08 DIAGNOSIS — G4733 Obstructive sleep apnea (adult) (pediatric): Secondary | ICD-10-CM | POA: Diagnosis not present

## 2022-04-08 DIAGNOSIS — K219 Gastro-esophageal reflux disease without esophagitis: Secondary | ICD-10-CM | POA: Diagnosis not present

## 2022-04-08 DIAGNOSIS — I110 Hypertensive heart disease with heart failure: Secondary | ICD-10-CM | POA: Diagnosis not present

## 2022-04-08 DIAGNOSIS — G9341 Metabolic encephalopathy: Secondary | ICD-10-CM | POA: Diagnosis not present

## 2022-04-08 DIAGNOSIS — I5033 Acute on chronic diastolic (congestive) heart failure: Secondary | ICD-10-CM | POA: Diagnosis not present

## 2022-04-08 DIAGNOSIS — E876 Hypokalemia: Secondary | ICD-10-CM | POA: Diagnosis not present

## 2022-04-08 DIAGNOSIS — D649 Anemia, unspecified: Secondary | ICD-10-CM | POA: Diagnosis not present

## 2022-04-08 DIAGNOSIS — J9602 Acute respiratory failure with hypercapnia: Secondary | ICD-10-CM | POA: Diagnosis not present

## 2022-04-08 DIAGNOSIS — I82409 Acute embolism and thrombosis of unspecified deep veins of unspecified lower extremity: Secondary | ICD-10-CM | POA: Diagnosis not present

## 2022-04-09 DIAGNOSIS — I10 Essential (primary) hypertension: Secondary | ICD-10-CM | POA: Diagnosis not present

## 2022-04-09 DIAGNOSIS — K922 Gastrointestinal hemorrhage, unspecified: Secondary | ICD-10-CM | POA: Diagnosis not present

## 2022-04-09 DIAGNOSIS — R778 Other specified abnormalities of plasma proteins: Secondary | ICD-10-CM | POA: Diagnosis not present

## 2022-04-09 DIAGNOSIS — R4182 Altered mental status, unspecified: Secondary | ICD-10-CM | POA: Diagnosis not present

## 2022-04-10 DIAGNOSIS — D751 Secondary polycythemia: Secondary | ICD-10-CM | POA: Diagnosis not present

## 2022-04-10 DIAGNOSIS — K219 Gastro-esophageal reflux disease without esophagitis: Secondary | ICD-10-CM | POA: Diagnosis not present

## 2022-04-10 DIAGNOSIS — J9602 Acute respiratory failure with hypercapnia: Secondary | ICD-10-CM | POA: Diagnosis not present

## 2022-04-10 DIAGNOSIS — J9601 Acute respiratory failure with hypoxia: Secondary | ICD-10-CM | POA: Diagnosis not present

## 2022-04-10 DIAGNOSIS — I82409 Acute embolism and thrombosis of unspecified deep veins of unspecified lower extremity: Secondary | ICD-10-CM | POA: Diagnosis not present

## 2022-04-10 DIAGNOSIS — K567 Ileus, unspecified: Secondary | ICD-10-CM | POA: Diagnosis not present

## 2022-04-10 DIAGNOSIS — D649 Anemia, unspecified: Secondary | ICD-10-CM | POA: Diagnosis not present

## 2022-04-10 DIAGNOSIS — G4733 Obstructive sleep apnea (adult) (pediatric): Secondary | ICD-10-CM | POA: Diagnosis not present

## 2022-04-10 DIAGNOSIS — I5033 Acute on chronic diastolic (congestive) heart failure: Secondary | ICD-10-CM | POA: Diagnosis not present

## 2022-04-10 DIAGNOSIS — I110 Hypertensive heart disease with heart failure: Secondary | ICD-10-CM | POA: Diagnosis not present

## 2022-04-10 DIAGNOSIS — E876 Hypokalemia: Secondary | ICD-10-CM | POA: Diagnosis not present

## 2022-04-10 DIAGNOSIS — G9341 Metabolic encephalopathy: Secondary | ICD-10-CM | POA: Diagnosis not present

## 2022-04-11 ENCOUNTER — Ambulatory Visit
Admission: RE | Admit: 2022-04-11 | Discharge: 2022-04-11 | Disposition: A | Discharge status: Home / Self Care | Payer: 59 | Source: Ambulatory Visit | Attending: Family Medicine | Admitting: Family Medicine

## 2022-04-11 DIAGNOSIS — R2240 Localized swelling, mass and lump, unspecified lower limb: Secondary | ICD-10-CM

## 2022-04-12 ENCOUNTER — Encounter (HOSPITAL_COMMUNITY): Payer: Self-pay

## 2022-04-12 ENCOUNTER — Emergency Department (HOSPITAL_COMMUNITY)
Admission: EM | Admit: 2022-04-12 | Discharge: 2022-04-12 | Disposition: A | Discharge status: Home / Self Care | Payer: 59 | Source: Home / Self Care | Attending: Emergency Medicine | Admitting: Emergency Medicine

## 2022-04-12 ENCOUNTER — Emergency Department (HOSPITAL_COMMUNITY): Payer: 59

## 2022-04-12 ENCOUNTER — Other Ambulatory Visit: Payer: Self-pay

## 2022-04-12 DIAGNOSIS — D751 Secondary polycythemia: Secondary | ICD-10-CM | POA: Diagnosis not present

## 2022-04-12 DIAGNOSIS — R69 Illness, unspecified: Secondary | ICD-10-CM | POA: Diagnosis not present

## 2022-04-12 DIAGNOSIS — F22 Delusional disorders: Secondary | ICD-10-CM | POA: Insufficient documentation

## 2022-04-12 DIAGNOSIS — J9601 Acute respiratory failure with hypoxia: Secondary | ICD-10-CM | POA: Diagnosis not present

## 2022-04-12 DIAGNOSIS — I509 Heart failure, unspecified: Secondary | ICD-10-CM | POA: Insufficient documentation

## 2022-04-12 DIAGNOSIS — R443 Hallucinations, unspecified: Secondary | ICD-10-CM | POA: Insufficient documentation

## 2022-04-12 DIAGNOSIS — Z20822 Contact with and (suspected) exposure to covid-19: Secondary | ICD-10-CM | POA: Insufficient documentation

## 2022-04-12 DIAGNOSIS — Z7901 Long term (current) use of anticoagulants: Secondary | ICD-10-CM | POA: Insufficient documentation

## 2022-04-12 DIAGNOSIS — I82409 Acute embolism and thrombosis of unspecified deep veins of unspecified lower extremity: Secondary | ICD-10-CM | POA: Diagnosis not present

## 2022-04-12 DIAGNOSIS — Z79899 Other long term (current) drug therapy: Secondary | ICD-10-CM | POA: Insufficient documentation

## 2022-04-12 DIAGNOSIS — I110 Hypertensive heart disease with heart failure: Secondary | ICD-10-CM | POA: Insufficient documentation

## 2022-04-12 DIAGNOSIS — R451 Restlessness and agitation: Secondary | ICD-10-CM | POA: Insufficient documentation

## 2022-04-12 DIAGNOSIS — Z743 Need for continuous supervision: Secondary | ICD-10-CM | POA: Diagnosis not present

## 2022-04-12 DIAGNOSIS — D649 Anemia, unspecified: Secondary | ICD-10-CM | POA: Diagnosis not present

## 2022-04-12 DIAGNOSIS — I10 Essential (primary) hypertension: Secondary | ICD-10-CM | POA: Diagnosis not present

## 2022-04-12 DIAGNOSIS — I5033 Acute on chronic diastolic (congestive) heart failure: Secondary | ICD-10-CM | POA: Diagnosis not present

## 2022-04-12 DIAGNOSIS — G4733 Obstructive sleep apnea (adult) (pediatric): Secondary | ICD-10-CM | POA: Diagnosis not present

## 2022-04-12 DIAGNOSIS — K219 Gastro-esophageal reflux disease without esophagitis: Secondary | ICD-10-CM | POA: Diagnosis not present

## 2022-04-12 DIAGNOSIS — R0689 Other abnormalities of breathing: Secondary | ICD-10-CM | POA: Diagnosis not present

## 2022-04-12 DIAGNOSIS — K567 Ileus, unspecified: Secondary | ICD-10-CM | POA: Diagnosis not present

## 2022-04-12 DIAGNOSIS — J9602 Acute respiratory failure with hypercapnia: Secondary | ICD-10-CM | POA: Diagnosis not present

## 2022-04-12 DIAGNOSIS — E876 Hypokalemia: Secondary | ICD-10-CM | POA: Insufficient documentation

## 2022-04-12 DIAGNOSIS — R4182 Altered mental status, unspecified: Secondary | ICD-10-CM | POA: Diagnosis not present

## 2022-04-12 DIAGNOSIS — G9341 Metabolic encephalopathy: Secondary | ICD-10-CM | POA: Diagnosis not present

## 2022-04-12 LAB — COMPREHENSIVE METABOLIC PANEL
ALT: 16 U/L (ref 0–44)
AST: 24 U/L (ref 15–41)
Albumin: 3.3 g/dL — ABNORMAL LOW (ref 3.5–5.0)
Alkaline Phosphatase: 51 U/L (ref 38–126)
Anion gap: 7 (ref 5–15)
BUN: <5 mg/dL — ABNORMAL LOW (ref 8–23)
CO2: 34 mmol/L — ABNORMAL HIGH (ref 22–32)
Calcium: 9.3 mg/dL (ref 8.9–10.3)
Chloride: 101 mmol/L (ref 98–111)
Creatinine, Ser: 0.47 mg/dL (ref 0.44–1.00)
GFR, Estimated: >60 mL/min (ref >60)
Glucose, Bld: 92 mg/dL (ref 70–99)
Potassium: 3 mmol/L — ABNORMAL LOW (ref 3.5–5.1)
Sodium: 142 mmol/L (ref 135–145)
Total Bilirubin: 0.8 mg/dL (ref 0.3–1.2)
Total Protein: 6.6 g/dL (ref 6.5–8.1)

## 2022-04-12 LAB — RAPID URINE DRUG SCREEN, HOSP PERFORMED
Amphetamines: NOT DETECTED
Barbiturates: NOT DETECTED
Benzodiazepines: NOT DETECTED
Cocaine: NOT DETECTED
Opiates: NOT DETECTED
Tetrahydrocannabinol: NOT DETECTED

## 2022-04-12 LAB — URINALYSIS, ROUTINE W REFLEX MICROSCOPIC
Bilirubin Urine: NEGATIVE
Glucose, UA: NEGATIVE mg/dL
Hgb urine dipstick: NEGATIVE
Ketones, ur: NEGATIVE mg/dL
Leukocytes,Ua: NEGATIVE
Nitrite: NEGATIVE
Protein, ur: NEGATIVE mg/dL
Specific Gravity, Urine: 1.016 (ref 1.005–1.030)
pH: 7 (ref 5.0–8.0)

## 2022-04-12 LAB — AMMONIA: Ammonia: 19 umol/L (ref 9–35)

## 2022-04-12 LAB — RESP PANEL BY RT-PCR (FLU A&B, COVID) ARPGX2
Influenza A by PCR: NEGATIVE
Influenza B by PCR: NEGATIVE
SARS Coronavirus 2 by RT PCR: NEGATIVE

## 2022-04-12 LAB — CBC WITH DIFFERENTIAL/PLATELET
Abs Immature Granulocytes: 0.01 10*3/uL (ref 0.00–0.07)
Basophils Absolute: 0 10*3/uL (ref 0.0–0.1)
Basophils Relative: 1 %
Eosinophils Absolute: 0.1 10*3/uL (ref 0.0–0.5)
Eosinophils Relative: 2 %
HCT: 42.4 % (ref 36.0–46.0)
Hemoglobin: 12.6 g/dL (ref 12.0–15.0)
Immature Granulocytes: 0 %
Lymphocytes Relative: 32 %
Lymphs Abs: 1.3 10*3/uL (ref 0.7–4.0)
MCH: 24.1 pg — ABNORMAL LOW (ref 26.0–34.0)
MCHC: 29.7 g/dL — ABNORMAL LOW (ref 30.0–36.0)
MCV: 81.2 fL (ref 80.0–100.0)
Monocytes Absolute: 0.3 10*3/uL (ref 0.1–1.0)
Monocytes Relative: 7 %
Neutro Abs: 2.4 10*3/uL (ref 1.7–7.7)
Neutrophils Relative %: 58 %
Platelets: 325 10*3/uL (ref 150–400)
RBC: 5.22 MIL/uL — ABNORMAL HIGH (ref 3.87–5.11)
RDW: 19.5 % — ABNORMAL HIGH (ref 11.5–15.5)
WBC: 4.1 10*3/uL (ref 4.0–10.5)
nRBC: 0 % (ref 0.0–0.2)

## 2022-04-12 LAB — PROTIME-INR
INR: 1.1 (ref 0.8–1.2)
Prothrombin Time: 14 seconds (ref 11.4–15.2)

## 2022-04-12 LAB — BLOOD GAS, VENOUS
Acid-Base Excess: 13.4 mmol/L — ABNORMAL HIGH (ref 0.0–2.0)
Bicarbonate: 40.5 mmol/L — ABNORMAL HIGH (ref 20.0–28.0)
O2 Saturation: 75.8 %
Patient temperature: 37
pCO2, Ven: 61 mmHg — ABNORMAL HIGH (ref 44–60)
pH, Ven: 7.43 (ref 7.25–7.43)
pO2, Ven: 44 mmHg (ref 32–45)

## 2022-04-12 LAB — LACTIC ACID, PLASMA: Lactic Acid, Venous: 1 mmol/L (ref 0.5–1.9)

## 2022-04-12 LAB — APTT: aPTT: 31 seconds (ref 24–36)

## 2022-04-12 LAB — CBG MONITORING, ED: Glucose-Capillary: 88 mg/dL (ref 70–99)

## 2022-04-12 MED ORDER — DAPAGLIFLOZIN PROPANEDIOL 10 MG PO TABS: 10.0000 mg | ORAL_TABLET | Freq: Every day | ORAL | Status: DC

## 2022-04-12 MED ORDER — ACETAMINOPHEN 325 MG PO TABS: 650.0000 mg | ORAL_TABLET | ORAL | Status: DC | PRN

## 2022-04-12 MED ORDER — APIXABAN 5 MG PO TABS: 5.0000 mg | ORAL_TABLET | Freq: Two times a day (BID) | ORAL | Status: DC

## 2022-04-12 MED ORDER — ATORVASTATIN CALCIUM 40 MG PO TABS
40.0000 mg | ORAL_TABLET | Freq: Every day | ORAL | Status: DC
  Administered 2022-04-12: 40 mg via ORAL
  Filled 2022-04-12: qty 1

## 2022-04-12 MED ORDER — METOPROLOL SUCCINATE ER 50 MG PO TB24
25.0000 mg | ORAL_TABLET | Freq: Every day | ORAL | Status: DC
  Administered 2022-04-12: 25 mg via ORAL
  Filled 2022-04-12: qty 1

## 2022-04-12 MED ORDER — ZIPRASIDONE MESYLATE 20 MG IM SOLR
20.0000 mg | Freq: Once | INTRAMUSCULAR | Status: AC
  Administered 2022-04-12: 20 mg via INTRAMUSCULAR
  Filled 2022-04-12: qty 20

## 2022-04-12 MED ORDER — IRBESARTAN 75 MG PO TABS: 37.5000 mg | ORAL_TABLET | Freq: Every day | ORAL | Status: DC

## 2022-04-12 NOTE — ED Notes (Addendum)
Report given to [employer] . Patient care given to RN Ryan .

## 2022-04-12 NOTE — ED Notes (Signed)
Received patient in no acute distress breathing spontaneously to room air . Patient has a Actuary present. All safety measures met . Continuing monitoring . Pending Psychiatry consult .

## 2022-04-12 NOTE — ED Provider Notes (Addendum)
[facility]-EMERGENCY DEPT Provider Note   CSN: [id number] Arrival date & time: 04/12/22  1258     History PMH: HTN, OSA, CHF, Obesity, DVT on Eliquis Chief Complaint  Patient presents with   Altered Mental Status    Tamara Russell is a 61 y.o. female. Patient presents with worsening agitation, delusions, and paranoid behavior since being discharged from rehab facility.  The patient was admitted back in April for acute encephalopathy thought to be due to CO2 retention and UTI.  She improved with CPAP.  Was ultimately discharged on June 2 to a rehab facility.  Family noted that ever since she was in the rehab facility she has been more more agitated as well as having delusions.  Only thing they have noticed is that she was unable to get the CPAP machine while she was in rehab. Patient states that there is nothing wrong with her and that her husband and daughter are out to get her.  She is also complaining of right foot pain that has been intermittent for the past several months.  She is getting this worked up outpatient.  She had an ultrasound yesterday that was negative.  It is actually getting better per patient and family member.   Altered Mental Status Presenting symptoms: no confusion   Associated symptoms: agitation and hallucinations   Associated symptoms: no abdominal pain, no fever, no nausea, no vomiting and no weakness        Home Medications Prior to Admission medications   Medication Sig Start Date End Date Taking? Authorizing Provider  apixaban (ELIQUIS) 5 MG TABS tablet Take 1 tablet (5 mg total) by mouth 2 (two) times daily. 03/17/22 04/16/22 Yes Arrien, York Ram, MD  acetaminophen (TYLENOL) 500 MG tablet Take 1 tablet (500 mg total) by mouth every 6 (six) hours as needed for moderate pain. Reported on 04/21/2016 Patient taking differently: Take 500 mg by mouth every 6 (six) hours as needed for moderate pain. 02/05/22   Barnetta Chapel, MD   atorvastatin (LIPITOR) 40 MG tablet Take 1 tablet (40 mg total) by mouth daily. Patient not taking: Reported on 02/06/2022 02/06/22 03/08/22  Berton Mount I, MD  dapagliflozin propanediol (FARXIGA) 10 MG TABS tablet Take 1 tablet (10 mg total) by mouth daily. 03/17/22   Arrien, York Ram, MD  diclofenac Sodium (VOLTAREN) 1 % GEL Apply 2 g topically 3 (three) times daily. To bilateral knees as needed. 03/17/22   Arrien, York Ram, MD  irbesartan (AVAPRO) 75 MG tablet Take 0.5 tablets (37.5 mg total) by mouth daily. 03/18/22 04/17/22  Arrien, York Ram, MD  metoprolol succinate (TOPROL-XL) 25 MG 24 hr tablet Take 1 tablet (25 mg total) by mouth daily. 03/18/22 04/17/22  Arrien, York Ram, MD  Multiple Vitamins-Minerals (CENTRUM SILVER ADULT 50+ PO) Take 1 tablet by mouth daily.    [provider]      Allergies    Penicillins    Review of Systems   Review of Systems  Constitutional:  Negative for chills and fever.  HENT:  Negative for congestion, sore throat and trouble swallowing.   Eyes:  Negative for photophobia and visual disturbance.  Respiratory:  Negative for cough and shortness of breath.   Cardiovascular:  Negative for chest pain and leg swelling.  Gastrointestinal:  Negative for abdominal pain, diarrhea, nausea and vomiting.  Genitourinary:  Negative for dysuria, flank pain and hematuria.  Neurological:  Negative for dizziness and weakness.  Psychiatric/Behavioral:  Positive for agitation and  hallucinations. Negative for behavioral problems, confusion, decreased concentration, dysphoric mood, self-injury, sleep disturbance and suicidal ideas. The patient is not nervous/anxious and is not hyperactive.   All other systems reviewed and are negative.   Physical Exam Updated Vital Signs BP (!) 168/95   Pulse 80   Temp 97.9 F (36.6 C) (Oral)   Resp 16   SpO2 98%  Physical Exam Vitals and nursing note reviewed.  Constitutional:      General: She is not  in acute distress.    Appearance: Normal appearance. She is well-developed. She is not ill-appearing, toxic-appearing or diaphoretic.  HENT:     Head: Normocephalic and atraumatic.     Nose: No nasal deformity.     Mouth/Throat:     Lips: Pink. No lesions.     Mouth: Mucous membranes are moist.     Pharynx: Oropharynx is clear. No oropharyngeal exudate or posterior oropharyngeal erythema.  Eyes:     General: Gaze aligned appropriately. No scleral icterus.       Right eye: No discharge.        Left eye: No discharge.     Conjunctiva/sclera: Conjunctivae normal.     Right eye: Right conjunctiva is not injected. No exudate or hemorrhage.    Left eye: Left conjunctiva is not injected. No exudate or hemorrhage. Cardiovascular:     Rate and Rhythm: Normal rate and regular rhythm.     Pulses: Normal pulses.     Heart sounds: Normal heart sounds. No murmur heard.    No friction rub. No gallop.  Pulmonary:     Effort: Pulmonary effort is normal. No respiratory distress.     Breath sounds: Normal breath sounds. No stridor. No wheezing, rhonchi or rales.  Chest:     Chest wall: No tenderness.  Abdominal:     General: Abdomen is flat. There is no distension.     Palpations: Abdomen is soft.     Tenderness: There is no abdominal tenderness. There is no right CVA tenderness, left CVA tenderness, guarding or rebound.  Musculoskeletal:     Right lower leg: No edema.     Left lower leg: No edema.     Comments: Right foot with mild swelling. Not tender to touch.   Skin:    General: Skin is warm and dry.     Coloration: Skin is not jaundiced.     Findings: No rash.  Neurological:     Mental Status: She is alert and oriented to person, place, and time.     Comments: Alert and Oriented x 3 Speech clear with no aphasia Cranial Nerve testing - PERRLA. EOM intact. No Nystagmus - Facial Sensation grossly intact - No facial asymmetry - Uvula and Tongue Midline - Accessory Muscles  intact Motor: - 5/5 motor strength in all four extremities.  Sensation: - Grossly intact in all four extremities.  Coordination:  - Finger to nose and heel to shin intact bilaterally   Psychiatric:        Mood and Affect: Mood normal.        Speech: Speech normal.        Behavior: Behavior normal. Behavior is cooperative.     ED Results / Procedures / Treatments   Labs (all labs ordered are listed, but only abnormal results are displayed) Labs Reviewed  CBC WITH DIFFERENTIAL/PLATELET - Abnormal; Notable for the following components:      Result Value   RBC 5.22 (*)    MCH 24.1 (*)  MCHC 29.7 (*)    RDW 19.5 (*)    All other components within normal limits  COMPREHENSIVE METABOLIC PANEL - Abnormal; Notable for the following components:   Potassium 3.0 (*)    CO2 34 (*)    BUN <5 (*)    Albumin 3.3 (*)    All other components within normal limits  BLOOD GAS, VENOUS - Abnormal; Notable for the following components:   pCO2, Ven 61 (*)    Bicarbonate 40.5 (*)    Acid-Base Excess 13.4 (*)    All other components within normal limits  URINE CULTURE  CULTURE, BLOOD (ROUTINE X 2)  CULTURE, BLOOD (ROUTINE X 2)  RESP PANEL BY RT-PCR (FLU A&B, COVID) ARPGX2  LACTIC ACID, PLASMA  URINALYSIS, ROUTINE W REFLEX MICROSCOPIC  RAPID URINE DRUG SCREEN, HOSP PERFORMED  PROTIME-INR  APTT  LACTIC ACID, PLASMA  AMMONIA  CBG MONITORING, ED    EKG None  Radiology CT HEAD WO CONTRAST  Result Date: 04/12/2022 CLINICAL DATA:  Mental status change. EXAM: CT HEAD WITHOUT CONTRAST TECHNIQUE: Contiguous axial images were obtained from the base of the skull through the vertex without intravenous contrast. RADIATION DOSE REDUCTION: This exam was performed according to the departmental dose-optimization program which includes automated exposure control, adjustment of the mA and/or kV according to patient size and/or use of iterative reconstruction technique. COMPARISON:  CT head 02/06/2022 MRI  head 02/09/2022 FINDINGS: Brain: No evidence of acute infarction, hemorrhage, hydrocephalus, extra-axial collection or mass lesion/mass effect. Mild white matter hypodensity bilaterally. This is unchanged and chronic. Vascular: Negative for hyperdense vessel Skull: Negative Sinuses/Orbits: Mild mucosal edema paranasal sinuses. Negative orbit Other: None IMPRESSION: No acute abnormality. Mild white matter changes consistent with chronic microvascular ischemia. Electronically Signed   By: Marlan Palauharles  Clark M.D.   On: 04/12/2022 14:48   US Venous Img Lower Bilateral (DVT)  Result Date: 04/11/2022 CLINICAL DATA:  A 61 year old female presents for evaluation of lower extremity swelling. EXAM: Bilateral LOWER EXTREMITY VENOUS DOPPLER ULTRASOUND TECHNIQUE: Gray-scale sonography with compression, as well as color and duplex ultrasound, were performed to evaluate the deep venous system(s) from the level of the common femoral vein through the popliteal and proximal calf veins. COMPARISON:  None Available. FINDINGS: VENOUS Normal compressibility of the common femoral, superficial femoral, and popliteal veins, as well as the visualized calf veins. Visualized portions of profunda femoral vein and great saphenous vein unremarkable. No filling defects to suggest DVT on grayscale or color Doppler imaging. Doppler waveforms show normal direction of venous flow, normal respiratory plasticity and response to augmentation. Peroneal veins are not seen and there are signs of lower extremity edema. OTHER Bilateral lower extremity edema greatest in the lower leg. Limitations: Lower extremity edema limiting assessment of calf veins as discussed. IMPRESSION: Negative for sonographic evidence of DVT with limited assessment of the peroneal veins bilaterally. Electronically Signed   By: Donzetta KohutGeoffrey  Wile M.D.   On: 04/11/2022 15:00    Procedures Procedures   Medications Ordered in ED Medications  acetaminophen (TYLENOL) tablet 650 mg (has  no administration in time range)  apixaban (ELIQUIS) tablet 5 mg (has no administration in time range)  atorvastatin (LIPITOR) tablet 40 mg (has no administration in time range)  dapagliflozin propanediol (FARXIGA) tablet 10 mg (has no administration in time range)  irbesartan (AVAPRO) tablet 37.5 mg (has no administration in time range)  metoprolol succinate (TOPROL-XL) 24 hr tablet 25 mg (has no administration in time range)    ED Course/ Medical Decision Making/  A&P                            Medical Decision Making Amount and/or Complexity of Data Reviewed Labs: ordered. Radiology: ordered.  Risk OTC drugs. Prescription drug management.    MDM  This is a 61 y.o. female who presents to the ED with worsening agitation/delusions  My Impression, Plan, and ED Course: Patient here with worsening agitation delusions since being discharged from rehab facility 1-1/2 weeks ago.  She expresses very poor annoyed behavior on exam as well as feelings that people are coming to get her. Is alert and oriented x2, but does not understand why she is here. She does not think anything is wrong with her and that her husband and daughter are the ones with the problem.  Broad AMS workup has been started   I personally ordered, reviewed, and interpreted all laboratory work and imaging and agree with radiologist interpretation. Results interpreted below: CBC without leukocytosis or anemia, CMP with hypokalemia which is chronic. CO2 34. Kidney function and lfts normal.lactate negative, UDS negative, UA normal, blood cultures in process. Coags normal. CT head normal.     She has an overall negative work-up.  She still remains with delusional and paranoid behavior.  I am concerned that this is a psychiatric issue versus a medical issue. She has been medically cleared. Consult to TTS placed.   @[phone] , was called to patient's bedside as patient's daughter is now here and concerned that she is getting a  psychiatric evaluation.  They are concerned about the CO2 retention.  I discussed with them that her CO2 is slightly elevated, but she has no acidosis and these are likely chronic findings.  I also discussed with them that patient is exhibiting psychiatric symptoms such as delusional and paranoid behavior.  This is not typical of CO2 retention.  I do agree that patient needs to be on CPAP at night, however there is not any emergent need for this.  She can arrange for this with her primary care.  Family is okay with her getting a psychiatric evaluation, however they do not want her to be in her room alone without any family member as they feel the patient will be very scared.  I told them that cannot make any exceptions to this.  They wish to take patient home and have her evaluated outpatient.  As patient is not exhibiting any symptoms to suggest that she is a harm to herself or others, I think that this is reasonable for her to be evaluated outpatient as it seems like this problem has been worsening over several weeks.  I will give him outpatient resources.  I urged him to come back if symptoms worsen or if any new symptoms occur.  She should also follow-up with her PCP  Charting Requirements Additional history is obtained from:  Independent historian, Child, and Spouse/Significant Other External Records from outside source obtained and reviewed including: Reviewed discharge note Social Determinants of Health:  none Pertinant PMH that complicates patient's illness: n/a  Patient Care Problems that were addressed during this visit: - Agitation: Acute illness - Delusion: Acute illness - Paranoid: Acute illness This patient was maintained on a cardiac monitor/telemetry. I personally viewed and interpreted the cardiac monitor which reveals an underlying rhythm of NSR Medications given in ED: n/a Reevaluation of the patient after these medicines showed that the patient stayed the same I have reviewed home  medications and  made changes accordingly. N/a Critical Care Interventions: n/a Consultations: tts Disposition: tts consult  This is a supervised visit with my attending physician, Dr. Effie Shy. We have discussed this patient and they have altered the plan as needed.  Portions of this note were generated with Scientist, clinical (histocompatibility and immunogenetics). Dictation errors may occur despite best attempts at proofreading.       Final Clinical Impression(s) / ED Diagnoses Final diagnoses:  Agitation  Delusion (HCC)  Paranoid [facility])    Rx / DC Orders ED Discharge Orders     None         Therese Sarah 04/12/22 1714    Mancel Bale, MD 04/12/22 1931    Victorino Dike, Finis Bud, PA-C 04/12/22 2204    Mancel Bale, MD 04/15/22 [phone]

## 2022-04-12 NOTE — ED Triage Notes (Signed)
Pt. BIB [facility] from home. EMS reported Pt. Came from a rehabilitation center on June 20th and was suppose to be sent home with a CPAP machine. Pt. Has failed to obtain a CPAP machine and family c/o AMS over the past few days.

## 2022-04-12 NOTE — Discharge Instructions (Addendum)
Your labs today were reassuring and we did not find a medical cause to your symptoms. I do think that you need the CPAP at night, however, you do not need this emergently. Please arrange with your primary care doctor to have this set up.  I have attached a recourse guide for outpatient psychiatric services.  You can also go to the behavioral health center anytime for any psychiatric concerns.   Please return here if you notice any new concerning symptoms or symptoms worsen.

## 2022-04-13 DIAGNOSIS — R69 Illness, unspecified: Secondary | ICD-10-CM | POA: Diagnosis not present

## 2022-04-14 ENCOUNTER — Inpatient Hospital Stay: Payer: 59

## 2022-04-14 ENCOUNTER — Ambulatory Visit: Payer: 59 | Admitting: Student

## 2022-04-14 ENCOUNTER — Encounter: Payer: Self-pay | Admitting: Student

## 2022-04-14 VITALS — BP 159/87 | HR 75 | Temp 98.1°F | Resp 17 | Ht 61.0 in | Wt 216.5 lb

## 2022-04-14 DIAGNOSIS — R001 Bradycardia, unspecified: Secondary | ICD-10-CM

## 2022-04-14 DIAGNOSIS — I5033 Acute on chronic diastolic (congestive) heart failure: Secondary | ICD-10-CM | POA: Diagnosis not present

## 2022-04-14 DIAGNOSIS — K567 Ileus, unspecified: Secondary | ICD-10-CM | POA: Diagnosis not present

## 2022-04-14 DIAGNOSIS — G4733 Obstructive sleep apnea (adult) (pediatric): Secondary | ICD-10-CM | POA: Diagnosis not present

## 2022-04-14 DIAGNOSIS — I82409 Acute embolism and thrombosis of unspecified deep veins of unspecified lower extremity: Secondary | ICD-10-CM | POA: Diagnosis not present

## 2022-04-14 DIAGNOSIS — I455 Other specified heart block: Secondary | ICD-10-CM | POA: Diagnosis not present

## 2022-04-14 DIAGNOSIS — J9601 Acute respiratory failure with hypoxia: Secondary | ICD-10-CM | POA: Diagnosis not present

## 2022-04-14 DIAGNOSIS — E876 Hypokalemia: Secondary | ICD-10-CM | POA: Diagnosis not present

## 2022-04-14 DIAGNOSIS — D649 Anemia, unspecified: Secondary | ICD-10-CM | POA: Diagnosis not present

## 2022-04-14 DIAGNOSIS — G9341 Metabolic encephalopathy: Secondary | ICD-10-CM | POA: Diagnosis not present

## 2022-04-14 DIAGNOSIS — J9602 Acute respiratory failure with hypercapnia: Secondary | ICD-10-CM | POA: Diagnosis not present

## 2022-04-14 DIAGNOSIS — I110 Hypertensive heart disease with heart failure: Secondary | ICD-10-CM | POA: Diagnosis not present

## 2022-04-14 DIAGNOSIS — K219 Gastro-esophageal reflux disease without esophagitis: Secondary | ICD-10-CM | POA: Diagnosis not present

## 2022-04-14 DIAGNOSIS — D751 Secondary polycythemia: Secondary | ICD-10-CM | POA: Diagnosis not present

## 2022-04-14 MED ORDER — POTASSIUM CHLORIDE ER 20 MEQ PO TBCR: 20.0000 meq | EXTENDED_RELEASE_TABLET | Freq: Every day | ORAL | 3 refills | Status: DC

## 2022-04-14 NOTE — Progress Notes (Signed)
Date:  04/14/2022   ID:  Campton, DOB 05-17-1961, MRN [id number]  PCP:  Lucianne Lei, MD  Cardiologist:  Alethia Berthold, DO, [facility] (established care 11/21/21)  REASON FOR CONSULT: Preprocedural risk stratification  REQUESTING PHYSICIAN:  Lucianne Lei, MD [place] [address] [place],  [place] [address]  Chief Complaint  Patient presents with   Follow-up   Equivocal stress test    HPI  Tamara Russell is a 61 y.o. African-American female who presents to the office with a chief complaint of " shortness of breath and procedure clearance." Patient's past medical history and cardiovascular risk factors include: Benign essential hypertension, history of COVID-19 infection (2020), former smoker, Obesity due to excess calories, post menopausal female.  She was originally referred to the office at the request of Lucianne Lei, MD for evaluation of preprocedural risk stratification, now following for evaluation of bradycardia.   During hospitalization in April 2023 cardiology was consulted given episodes of bradycardia during sleep.  Following correction of electrolytes and holding amlodipine patient had no recurrence of bradycardia or sinus pauses during sleep, and in fact had resting tachycardia likely related to underlying UTI.  Patient was started on Toprol-XL 12.5 mg p.o. daily and advised to follow-up with the office.  Unfortunately patient was lost to follow-up after discharge.  She now presents for further evaluation.   Patient's daughter is present at bedside and contributes to history. Patient remains confused since discharge, patient's daughter reports that patient becomes more agitated in the evenings and is having difficulty sleeping. Patient is still awaiting CPAP, currently working with PCP to obtain CPAP. Currently on 2 L/min via nasal cannula.   Patient is scheduled to see PCP next week for follow up and repeat labs. Patient has had no syncope or near syncope. She  does report continued fatigue.   Patient is now staying with her daughter. Patient's daughter is planning to discuss pulmonary and psychiatry referral with PCP at visit next week.   FUNCTIONAL STATUS: No structured exercise program or daily routine.   ALLERGIES: Allergies  Allergen Reactions   Penicillins Other (See Comments)    Childhood allergy. Pt does not remember reaction Tolerated cefazolin 5/1-5/6, cefepime 5/12-5/14    MEDICATION LIST PRIOR TO VISIT: Current Meds  Medication Sig   acetaminophen (TYLENOL) 500 MG tablet Take 1 tablet (500 mg total) by mouth every 6 (six) hours as needed for moderate pain. Reported on 04/21/2016 (Patient taking differently: Take 500 mg by mouth every 6 (six) hours as needed for moderate pain.)   atorvastatin (LIPITOR) 40 MG tablet Take 1 tablet (40 mg total) by mouth daily. (Patient taking differently: Take 40 mg by mouth at bedtime.)   dapagliflozin propanediol (FARXIGA) 10 MG TABS tablet Take 10 mg by mouth daily.   diclofenac Sodium (VOLTAREN) 1 % GEL Apply 2 g topically 3 (three) times daily. To bilateral knees as needed. (Patient taking differently: Apply 2 g topically daily as needed (knee pain).)   diphenhydramine-acetaminophen (TYLENOL PM) 25-500 MG TABS tablet Take 1 tablet by mouth at bedtime as needed (pain).   ferrous sulfate 324 MG TBEC Take 324 mg by mouth daily with breakfast.   irbesartan (AVAPRO) 75 MG tablet Take 0.5 tablets (37.5 mg total) by mouth daily. (Patient taking differently: Take 75 mg by mouth daily.)   Melatonin 10 MG TABS Take 10 mg by mouth at bedtime.   metoprolol succinate (TOPROL-XL) 25 MG 24 hr tablet Take 1 tablet (25 mg total)  by mouth daily.   Multiple Vitamins-Minerals (CENTRUM SILVER ADULT 50+ PO) Take 1 tablet by mouth daily.   potassium chloride 20 MEQ TBCR Take 20 mEq by mouth daily.     PAST MEDICAL HISTORY: Past Medical History:  Diagnosis Date   Acid reflux    Complication of anesthesia    slow to  wake up   History of COVID-19    Hypertension     PAST SURGICAL HISTORY: Past Surgical History:  Procedure Laterality Date   ABDOMINAL HYSTERECTOMY     BIOPSY  11/17/2018   Procedure: BIOPSY;  Surgeon: Jerene Bears, MD;  Location: [facility] ENDOSCOPY;  Service: Gastroenterology;;   BREAST EXCISIONAL BIOPSY Left    benign   BREAST EXCISIONAL BIOPSY Right    benign   ESOPHAGOGASTRODUODENOSCOPY (EGD) WITH PROPOFOL N/A 11/17/2018   Procedure: ESOPHAGOGASTRODUODENOSCOPY (EGD) WITH PROPOFOL;  Surgeon: Jerene Bears, MD;  Location: [facility] ENDOSCOPY;  Service: Gastroenterology;  Laterality: N/A;    FAMILY HISTORY: The patient family history includes Cancer (age of onset: 91) in her father; Colon cancer in her maternal grandfather and paternal grandmother; Diabetes in her maternal grandmother and paternal grandmother; Hypertension in her father.  SOCIAL HISTORY:  The patient  reports that she quit smoking about 26 years ago. Her smoking use included cigarettes. She started smoking about 46 years ago. She has a 10.00 pack-year smoking history. She has never used smokeless tobacco. She reports that she does not drink alcohol and does not use drugs.  REVIEW OF SYSTEMS: Review of Systems  Constitutional: Positive for malaise/fatigue and weight gain.  Cardiovascular:  Positive for dyspnea on exertion. Negative for chest pain, leg swelling, palpitations and syncope.    PHYSICAL EXAM:    04/14/2022    2:18 PM 04/14/2022    1:35 PM 04/12/2022   11:09 PM  Vitals with BMI  Height  5' 1"    Weight  216 lbs 8 oz   BMI  93.57   Systolic 017 [id number] 903  Diastolic 87 84 84  Pulse 75 81 65    CONSTITUTIONAL: Appears older than stated age, hemodynamically stable, no acute distress.  SKIN: Skin is warm and dry. No rash noted. No cyanosis. No pallor. No jaundice HEAD: Normocephalic and atraumatic.  EYES: No scleral icterus MOUTH/THROAT: Moist oral membranes.  NECK: Unable to evaluate JVP due to adipose tissue and  neck stature. No thyromegaly noted. No carotid bruits  LYMPHATIC: No visible cervical adenopathy.  CHEST Normal respiratory effort. No intercostal retractions  LUNGS: Clear to auscultation bilaterally.  No stridor. No wheezes. No rales.  CARDIOVASCULAR: regular rhythm, Regular, positive S1-S2, no murmurs rubs or gallops appreciated. ABDOMINAL: Obese, soft, nontender, nondistended, positive bowel sounds all 4 quadrants. No apparent ascites.  EXTREMITIES: No peripheral edema, warm to touch, +2 bilateral DP and PT pulses HEMATOLOGIC: No significant bruising NEUROLOGIC: Oriented to person, place, and time. Nonfocal. Normal muscle tone.  PSYCHIATRIC: Normal mood and affect. Normal behavior. Cooperative  CARDIAC DATABASE: EKG: 11/21/2021: NSR, 86 bpm, without underlying injury pattern.  Echocardiogram: 02/04/2022:  1. Left ventricular ejection fraction, by estimation, is 60 to 65%. The left ventricle has normal function. The left ventricle has no regional wall motion abnormalities. Left ventricular diastolic parameters are consistent with Grade I diastolic  dysfunction (impaired relaxation).   2. Right ventricular systolic function is normal. The right ventricular size is normal. Tricuspid regurgitation signal is inadequate for assessing PA pressure.   3. The mitral valve is grossly normal. No evidence of mitral  valve regurgitation.   4. The aortic valve is tricuspid. Aortic valve regurgitation is not visualized.   5. The inferior vena cava is normal in size with greater than 50% respiratory variability, suggesting right atrial pressure of 3 mmHg.   Stress Testing: Lexiscan (Walking) Tetrofosmin stress test 01/17/2021: Walking/Lexiscan nuclear stress test. 2-day rest/stress protocol.  Small size, mild intensity perfusion defect, suggestive of possible reversible perfusion defect involving apical anterior and apex (overall accuracy is limited due to soft tissue attenuation BMI 43.6, images taken  in seated position).  Without convincing evidence of prior myocardial infarction. Calculated LVEF 75%, visually appears hyperdynamic.   Left ventricular size upper limit of normal, wall thickness preserved, and significant regional wall motion abnormalities. Clinical correlation required.  No prior studies available for comparison. Low risk study.  Heart Catheterization: None  Cardiac event monitor 02/05/2020 Sinus rhythm Occasional premature ventricular contractions No AV block or pauses No sustained arrhythmias Baseline artifact limits interpretation at times  LABORATORY DATA:    Latest Ref Rng & Units 04/12/2022    1:38 PM 03/14/2022    4:42 AM 03/13/2022    6:00 AM  CBC  WBC 4.0 - 10.5 K/uL 4.1  7.4  6.4   Hemoglobin 12.0 - 15.0 g/dL 12.6  9.3  9.8   Hematocrit 36.0 - 46.0 % 42.4  30.6  32.3   Platelets 150 - 400 K/uL 325  189  198        Latest Ref Rng & Units 04/12/2022    1:38 PM 03/17/2022    3:25 AM 03/16/2022    3:58 AM  CMP  Glucose 70 - 99 mg/dL 92  92  94   BUN 8 - 23 mg/dL <5  7  8    Creatinine 0.44 - 1.00 mg/dL 0.47  0.48  0.41   Sodium 135 - 145 mmol/L 142  134  136   Potassium 3.5 - 5.1 mmol/L 3.0  3.5  3.3   Chloride 98 - 111 mmol/L 101  91  90   CO2 22 - 32 mmol/L 34  38  40   Calcium 8.9 - 10.3 mg/dL 9.3  8.8  8.7   Total Protein 6.5 - 8.1 g/dL 6.6     Total Bilirubin 0.3 - 1.2 mg/dL 0.8     Alkaline Phos 38 - 126 U/L 51     AST 15 - 41 U/L 24     ALT 0 - 44 U/L 16       Lipid Panel     Component Value Date/Time   CHOL 163 02/02/2022 0424   TRIG 84 02/25/2022 0341   HDL 78 02/02/2022 0424   CHOLHDL 2.1 02/02/2022 0424   VLDL 5 02/02/2022 0424   LDLCALC 80 02/02/2022 0424    No components found for: "NTPROBNP" No results for input(s): "PROBNP" in the last 8760 hours. Recent Labs    02/02/22 0447  TSH 1.377    BMP Recent Labs    03/16/22 0358 03/17/22 0325 04/12/22 1338  NA 136 134* 142  K 3.3* 3.5 3.0*  CL 90* 91* 101  CO2 40*  38* 34*  GLUCOSE 94 92 92  BUN 8 7* <5*  CREATININE 0.41* 0.48 0.47  CALCIUM 8.7* 8.8* 9.3  GFRNONAA >60 >60 >60    HEMOGLOBIN A1C Lab Results  Component Value Date   HGBA1C 5.6 04/21/2016   MPG 114 04/21/2016   External Labs:  11/22/2021 Total cholesterol 167, triglycerides 41, HDL 71, LDL 82, non-HDL 94.  BUN 9, creatinine 0.57. eGFR >60. Sodium 138, potassium 3.7, chloride 90, bicarb 39, AST 19, ALT 13, alkaline phosphatase 65 A1c 5.8 BNP 14. Hemoglobin 13.7 g/dL, hematocrit 43.7% D-dimer 0.21  IMPRESSION:    ICD-10-CM   1. Sinus pause  I45.5     2. Sinus bradycardia  R00.1 Ambulatory referral to Sleep Studies    LONG TERM MONITOR (3-14 DAYS)    3. OSA (obstructive sleep apnea)  G47.33 Ambulatory referral to Sleep Studies       RECOMMENDATIONS: Tamara Russell is a 61 y.o. African-American female whose past medical history and cardiac risk factors include: Benign essential hypertension, history of COVID-19 infection (2020), former smoker, Obesity due to excess calories, post menopausal female.  Reviewed and discussed results of echocardiogram during hospitalization in April.  Given episodes of bradycardia on cardiac monitor will obtain 2-week cardiac telemetry to further evaluate for recurrence of underlying sinus pauses, although suspect this was related to AV nodal blocking agents which has since been discontinued.  Patient's blood pressure is elevated in the office today, however patient and her daughter wish to hold off on medication changes at this time and we will first continue to monitor blood pressure daily at home.  Patient's daughter will notify our office of home blood pressure readings in 1 week.  Given hypokalemia we will start patient on potassium supplement once daily.  She is scheduled to follow-up with PCP in 1 week for repeat labs, will defer reevaluation to primary at that time.  Patient continues to have waxing and waning mental status, will defer  further recommendations to PCP, could consider referral to pulmonology and/or psychiatry.  SPECT underlying sleep apnea which is currently untreated as she does not have CPAP is contributing to fatigue as well as difficulty sleeping.  We will refer patient to sleep medicine for further evaluation and recommendations.  Follow-up in 4 to 6 weeks, sooner if needed.   FINAL MEDICATION LIST END OF ENCOUNTER: Meds ordered this encounter  Medications   potassium chloride 20 MEQ TBCR    Sig: Take 20 mEq by mouth daily.    Dispense:  30 tablet    Refill:  3    There are no discontinued medications.    Current Outpatient Medications:    acetaminophen (TYLENOL) 500 MG tablet, Take 1 tablet (500 mg total) by mouth every 6 (six) hours as needed for moderate pain. Reported on 04/21/2016 (Patient taking differently: Take 500 mg by mouth every 6 (six) hours as needed for moderate pain.), Disp: 30 tablet, Rfl: 0   atorvastatin (LIPITOR) 40 MG tablet, Take 1 tablet (40 mg total) by mouth daily. (Patient taking differently: Take 40 mg by mouth at bedtime.), Disp: 30 tablet, Rfl: 0   dapagliflozin propanediol (FARXIGA) 10 MG TABS tablet, Take 10 mg by mouth daily., Disp: , Rfl:    diclofenac Sodium (VOLTAREN) 1 % GEL, Apply 2 g topically 3 (three) times daily. To bilateral knees as needed. (Patient taking differently: Apply 2 g topically daily as needed (knee pain).), Disp: 50 g, Rfl: 0   diphenhydramine-acetaminophen (TYLENOL PM) 25-500 MG TABS tablet, Take 1 tablet by mouth at bedtime as needed (pain)., Disp: , Rfl:    ferrous sulfate 324 MG TBEC, Take 324 mg by mouth daily with breakfast., Disp: , Rfl:    irbesartan (AVAPRO) 75 MG tablet, Take 0.5 tablets (37.5 mg total) by mouth daily. (Patient taking differently: Take 75 mg by mouth daily.), Disp: 15 tablet, Rfl: 0  Melatonin 10 MG TABS, Take 10 mg by mouth at bedtime., Disp: , Rfl:    metoprolol succinate (TOPROL-XL) 25 MG 24 hr tablet, Take 1 tablet (25  mg total) by mouth daily., Disp: 30 tablet, Rfl: 0   Multiple Vitamins-Minerals (CENTRUM SILVER ADULT 50+ PO), Take 1 tablet by mouth daily., Disp: , Rfl:    potassium chloride 20 MEQ TBCR, Take 20 mEq by mouth daily., Disp: 30 tablet, Rfl: 3   apixaban (ELIQUIS) 5 MG TABS tablet, Take 1 tablet (5 mg total) by mouth 2 (two) times daily. (Patient taking differently: Take 5 mg by mouth every evening.), Disp: 60 tablet, Rfl: 0   dapagliflozin propanediol (FARXIGA) 10 MG TABS tablet, Take 1 tablet (10 mg total) by mouth daily., Disp: 30 tablet, Rfl: 0  Orders Placed This Encounter  Procedures   Ambulatory referral to Sleep Studies   LONG TERM MONITOR (3-14 DAYS)    There are no Patient Instructions on file for this visit.   --Continue cardiac medications as reconciled in final medication list. --Return in about 6 weeks (around 05/26/2022) for monitor results, HTN . Or sooner if needed. --Continue follow-up with your primary care physician regarding the management of your other chronic comorbid conditions.  Patient's questions and concerns were addressed to her satisfaction. She voices understanding of the instructions provided during this encounter.   This note was created using a voice recognition software as a result there may be grammatical errors inadvertently enclosed that do not reflect the nature of this encounter. Every attempt is made to correct such errors.   Alethia Berthold, PA-C 04/14/2022, 2:48 PM Office: [phone]

## 2022-04-15 ENCOUNTER — Encounter (HOSPITAL_COMMUNITY): Payer: Self-pay

## 2022-04-15 ENCOUNTER — Other Ambulatory Visit: Payer: Self-pay

## 2022-04-15 ENCOUNTER — Emergency Department (HOSPITAL_COMMUNITY): Payer: 59

## 2022-04-15 ENCOUNTER — Inpatient Hospital Stay (HOSPITAL_COMMUNITY)
Admission: EM | Admit: 2022-04-15 | Discharge: 2022-04-19 | DRG: 071 | Disposition: A | Discharge status: Home Health | Payer: 59 | Attending: Internal Medicine | Admitting: Internal Medicine

## 2022-04-15 DIAGNOSIS — W19XXXA Unspecified fall, initial encounter: Secondary | ICD-10-CM | POA: Diagnosis not present

## 2022-04-15 DIAGNOSIS — Z20822 Contact with and (suspected) exposure to covid-19: Secondary | ICD-10-CM | POA: Diagnosis present

## 2022-04-15 DIAGNOSIS — Z800 Family history of malignant neoplasm of digestive organs: Secondary | ICD-10-CM

## 2022-04-15 DIAGNOSIS — J9612 Chronic respiratory failure with hypercapnia: Secondary | ICD-10-CM | POA: Diagnosis not present

## 2022-04-15 DIAGNOSIS — I824Y2 Acute embolism and thrombosis of unspecified deep veins of left proximal lower extremity: Secondary | ICD-10-CM

## 2022-04-15 DIAGNOSIS — E662 Morbid (severe) obesity with alveolar hypoventilation: Secondary | ICD-10-CM | POA: Diagnosis not present

## 2022-04-15 DIAGNOSIS — I110 Hypertensive heart disease with heart failure: Secondary | ICD-10-CM | POA: Diagnosis not present

## 2022-04-15 DIAGNOSIS — R5381 Other malaise: Secondary | ICD-10-CM | POA: Diagnosis not present

## 2022-04-15 DIAGNOSIS — Z8249 Family history of ischemic heart disease and other diseases of the circulatory system: Secondary | ICD-10-CM | POA: Diagnosis not present

## 2022-04-15 DIAGNOSIS — Z87891 Personal history of nicotine dependence: Secondary | ICD-10-CM | POA: Diagnosis not present

## 2022-04-15 DIAGNOSIS — E876 Hypokalemia: Secondary | ICD-10-CM | POA: Diagnosis not present

## 2022-04-15 DIAGNOSIS — Z743 Need for continuous supervision: Secondary | ICD-10-CM | POA: Diagnosis not present

## 2022-04-15 DIAGNOSIS — Z6841 Body Mass Index (BMI) 40.0 and over, adult: Secondary | ICD-10-CM | POA: Diagnosis not present

## 2022-04-15 DIAGNOSIS — Z8616 Personal history of COVID-19: Secondary | ICD-10-CM

## 2022-04-15 DIAGNOSIS — R410 Disorientation, unspecified: Secondary | ICD-10-CM

## 2022-04-15 DIAGNOSIS — R9431 Abnormal electrocardiogram [ECG] [EKG]: Secondary | ICD-10-CM | POA: Diagnosis not present

## 2022-04-15 DIAGNOSIS — Z7901 Long term (current) use of anticoagulants: Secondary | ICD-10-CM

## 2022-04-15 DIAGNOSIS — I82409 Acute embolism and thrombosis of unspecified deep veins of unspecified lower extremity: Secondary | ICD-10-CM | POA: Diagnosis present

## 2022-04-15 DIAGNOSIS — G9341 Metabolic encephalopathy: Secondary | ICD-10-CM | POA: Diagnosis not present

## 2022-04-15 DIAGNOSIS — J9611 Chronic respiratory failure with hypoxia: Secondary | ICD-10-CM | POA: Diagnosis present

## 2022-04-15 DIAGNOSIS — E66813 Obesity, class 3: Secondary | ICD-10-CM | POA: Diagnosis present

## 2022-04-15 DIAGNOSIS — R0689 Other abnormalities of breathing: Secondary | ICD-10-CM

## 2022-04-15 DIAGNOSIS — I10 Essential (primary) hypertension: Secondary | ICD-10-CM | POA: Diagnosis present

## 2022-04-15 DIAGNOSIS — R4182 Altered mental status, unspecified: Secondary | ICD-10-CM | POA: Diagnosis not present

## 2022-04-15 DIAGNOSIS — Z87440 Personal history of urinary (tract) infections: Secondary | ICD-10-CM

## 2022-04-15 DIAGNOSIS — E785 Hyperlipidemia, unspecified: Secondary | ICD-10-CM | POA: Diagnosis present

## 2022-04-15 DIAGNOSIS — Z8661 Personal history of infections of the central nervous system: Secondary | ICD-10-CM | POA: Diagnosis not present

## 2022-04-15 DIAGNOSIS — Z86718 Personal history of other venous thrombosis and embolism: Secondary | ICD-10-CM | POA: Diagnosis not present

## 2022-04-15 DIAGNOSIS — R443 Hallucinations, unspecified: Secondary | ICD-10-CM | POA: Diagnosis not present

## 2022-04-15 DIAGNOSIS — Z9981 Dependence on supplemental oxygen: Secondary | ICD-10-CM | POA: Diagnosis not present

## 2022-04-15 DIAGNOSIS — I5032 Chronic diastolic (congestive) heart failure: Secondary | ICD-10-CM | POA: Diagnosis not present

## 2022-04-15 DIAGNOSIS — J9811 Atelectasis: Secondary | ICD-10-CM | POA: Diagnosis not present

## 2022-04-15 DIAGNOSIS — D509 Iron deficiency anemia, unspecified: Secondary | ICD-10-CM | POA: Diagnosis not present

## 2022-04-15 DIAGNOSIS — Z833 Family history of diabetes mellitus: Secondary | ICD-10-CM

## 2022-04-15 DIAGNOSIS — G9340 Encephalopathy, unspecified: Secondary | ICD-10-CM | POA: Diagnosis present

## 2022-04-15 HISTORY — DX: Hyperlipidemia, unspecified: E78.5

## 2022-04-15 HISTORY — DX: Acute embolism and thrombosis of unspecified deep veins of unspecified lower extremity: I82.409

## 2022-04-15 HISTORY — DX: Chronic diastolic (congestive) heart failure: I50.32

## 2022-04-15 HISTORY — DX: Chronic respiratory failure with hypoxia: J96.12

## 2022-04-15 HISTORY — DX: Chronic respiratory failure with hypoxia: J96.11

## 2022-04-15 LAB — I-STAT ARTERIAL BLOOD GAS, ED
Acid-Base Excess: 9 mmol/L — ABNORMAL HIGH (ref 0.0–2.0)
Bicarbonate: 35.3 mmol/L — ABNORMAL HIGH (ref 20.0–28.0)
Calcium, Ion: 1.24 mmol/L (ref 1.15–1.40)
HCT: 38 % (ref 36.0–46.0)
Hemoglobin: 12.9 g/dL (ref 12.0–15.0)
O2 Saturation: 84 %
Patient temperature: 98.6
Potassium: 3.1 mmol/L — ABNORMAL LOW (ref 3.5–5.1)
Sodium: 139 mmol/L (ref 135–145)
TCO2: 37 mmol/L — ABNORMAL HIGH (ref 22–32)
pCO2 arterial: 54.6 mmHg — ABNORMAL HIGH (ref 32–48)
pH, Arterial: 7.419 (ref 7.35–7.45)
pO2, Arterial: 50 mmHg — ABNORMAL LOW (ref 83–108)

## 2022-04-15 LAB — CBC WITH DIFFERENTIAL/PLATELET
Abs Immature Granulocytes: 0 10*3/uL (ref 0.00–0.07)
Basophils Absolute: 0 10*3/uL (ref 0.0–0.1)
Basophils Relative: 1 %
Eosinophils Absolute: 0.1 10*3/uL (ref 0.0–0.5)
Eosinophils Relative: 1 %
HCT: 38.6 % (ref 36.0–46.0)
Hemoglobin: 11.6 g/dL — ABNORMAL LOW (ref 12.0–15.0)
Immature Granulocytes: 0 %
Lymphocytes Relative: 32 %
Lymphs Abs: 1.4 10*3/uL (ref 0.7–4.0)
MCH: 24.2 pg — ABNORMAL LOW (ref 26.0–34.0)
MCHC: 30.1 g/dL (ref 30.0–36.0)
MCV: 80.6 fL (ref 80.0–100.0)
Monocytes Absolute: 0.4 10*3/uL (ref 0.1–1.0)
Monocytes Relative: 8 %
Neutro Abs: 2.5 10*3/uL (ref 1.7–7.7)
Neutrophils Relative %: 58 %
Platelets: 309 10*3/uL (ref 150–400)
RBC: 4.79 MIL/uL (ref 3.87–5.11)
RDW: 18.6 % — ABNORMAL HIGH (ref 11.5–15.5)
WBC: 4.3 10*3/uL (ref 4.0–10.5)
nRBC: 0 % (ref 0.0–0.2)

## 2022-04-15 LAB — URINE CULTURE: Culture: 60000 — AB

## 2022-04-15 LAB — COMPREHENSIVE METABOLIC PANEL
ALT: 17 U/L (ref 0–44)
AST: 22 U/L (ref 15–41)
Albumin: 2.9 g/dL — ABNORMAL LOW (ref 3.5–5.0)
Alkaline Phosphatase: 48 U/L (ref 38–126)
Anion gap: 10 (ref 5–15)
BUN: <5 mg/dL — ABNORMAL LOW (ref 8–23)
CO2: 30 mmol/L (ref 22–32)
Calcium: 9.2 mg/dL (ref 8.9–10.3)
Chloride: 100 mmol/L (ref 98–111)
Creatinine, Ser: 0.46 mg/dL (ref 0.44–1.00)
GFR, Estimated: >60 mL/min (ref >60)
Glucose, Bld: 97 mg/dL (ref 70–99)
Potassium: 3.3 mmol/L — ABNORMAL LOW (ref 3.5–5.1)
Sodium: 140 mmol/L (ref 135–145)
Total Bilirubin: 0.8 mg/dL (ref 0.3–1.2)
Total Protein: 6 g/dL — ABNORMAL LOW (ref 6.5–8.1)

## 2022-04-15 LAB — CK: Total CK: 101 U/L (ref 38–234)

## 2022-04-15 LAB — AMMONIA: Ammonia: 27 umol/L (ref 9–35)

## 2022-04-15 LAB — ETHANOL: Alcohol, Ethyl (B): <10 mg/dL (ref <10)

## 2022-04-15 LAB — MAGNESIUM: Magnesium: 1.6 mg/dL — ABNORMAL LOW (ref 1.7–2.4)

## 2022-04-15 LAB — PROCALCITONIN: Procalcitonin: <0.1 ng/mL

## 2022-04-15 LAB — TSH: TSH: 1.798 u[IU]/mL (ref 0.350–4.500)

## 2022-04-15 LAB — BRAIN NATRIURETIC PEPTIDE: B Natriuretic Peptide: 39.2 pg/mL (ref 0.0–100.0)

## 2022-04-15 MED ORDER — MELATONIN 5 MG PO TABS
10.0000 mg | ORAL_TABLET | Freq: Every evening | ORAL | Status: DC | PRN
  Administered 2022-04-17 – 2022-04-18 (×2): 10 mg via ORAL
  Filled 2022-04-15 (×2): qty 2

## 2022-04-15 MED ORDER — METOPROLOL SUCCINATE ER 25 MG PO TB24
25.0000 mg | ORAL_TABLET | Freq: Every day | ORAL | Status: DC
  Administered 2022-04-17 – 2022-04-19 (×3): 25 mg via ORAL
  Filled 2022-04-15 (×3): qty 1

## 2022-04-15 MED ORDER — POTASSIUM CHLORIDE CRYS ER 20 MEQ PO TBCR
40.0000 meq | EXTENDED_RELEASE_TABLET | Freq: Once | ORAL | Status: DC
  Filled 2022-04-15: qty 2

## 2022-04-15 MED ORDER — APIXABAN 5 MG PO TABS
5.0000 mg | ORAL_TABLET | Freq: Two times a day (BID) | ORAL | Status: DC
  Administered 2022-04-16 – 2022-04-19 (×6): 5 mg via ORAL
  Filled 2022-04-15 (×6): qty 1

## 2022-04-15 MED ORDER — HALOPERIDOL LACTATE 5 MG/ML IJ SOLN
5.0000 mg | Freq: Once | INTRAMUSCULAR | Status: AC
  Administered 2022-04-15: 5 mg via INTRAVENOUS
  Filled 2022-04-15: qty 1

## 2022-04-15 MED ORDER — FERROUS SULFATE 325 (65 FE) MG PO TABS
325.0000 mg | ORAL_TABLET | Freq: Every day | ORAL | Status: DC
  Administered 2022-04-16 – 2022-04-18 (×3): 325 mg via ORAL
  Filled 2022-04-15 (×4): qty 1

## 2022-04-15 MED ORDER — POTASSIUM CHLORIDE 10 MEQ/100ML IV SOLN
10.0000 meq | INTRAVENOUS | Status: AC
  Administered 2022-04-16 (×4): 10 meq via INTRAVENOUS
  Filled 2022-04-15 (×4): qty 100

## 2022-04-15 MED ORDER — ACETAMINOPHEN 325 MG PO TABS
650.0000 mg | ORAL_TABLET | Freq: Four times a day (QID) | ORAL | Status: DC | PRN
  Administered 2022-04-17 – 2022-04-19 (×3): 650 mg via ORAL
  Filled 2022-04-15 (×3): qty 2

## 2022-04-15 MED ORDER — ACETAMINOPHEN 650 MG RE SUPP: 650.0000 mg | Freq: Four times a day (QID) | RECTAL | Status: DC | PRN

## 2022-04-15 MED ORDER — HALOPERIDOL LACTATE 5 MG/ML IJ SOLN
5.0000 mg | Freq: Once | INTRAMUSCULAR | Status: AC
Start: 2022-04-15 — End: 2022-04-15
  Administered 2022-04-15: 5 mg via INTRAMUSCULAR
  Filled 2022-04-15: qty 1

## 2022-04-15 MED ORDER — IRBESARTAN 75 MG PO TABS
75.0000 mg | ORAL_TABLET | Freq: Every day | ORAL | Status: DC
  Administered 2022-04-17 – 2022-04-19 (×3): 75 mg via ORAL
  Filled 2022-04-15 (×4): qty 1

## 2022-04-15 MED ORDER — ATORVASTATIN CALCIUM 40 MG PO TABS
40.0000 mg | ORAL_TABLET | Freq: Every day | ORAL | Status: DC
  Administered 2022-04-17 – 2022-04-19 (×3): 40 mg via ORAL
  Filled 2022-04-15 (×3): qty 1

## 2022-04-15 NOTE — Progress Notes (Signed)
Placed patient on CPAP for HS use, auto settings with 3L O2.

## 2022-04-15 NOTE — ED Notes (Signed)
Pt's brief and sheets changed. A pure wick was applied

## 2022-04-15 NOTE — ED Triage Notes (Signed)
Daughter called for hallucinations recently  Admitted at [facility] for uti for a month went to rehab and has been home from rehab since the 20th and things have not improved so the daughter wanted to check her out.

## 2022-04-15 NOTE — ED Notes (Signed)
Pt's daughter updated and RT at bedside.

## 2022-04-15 NOTE — ED Provider Notes (Signed)
Still unable to get a urine on her.  But her blood gas does show an elevated CO2.  Discussed with hospitalist who will admit.  Patient is still showing some confusion and paranoia.  But not as bad as she was before when she received Haldol.  Patient may very well need CPAP that seems to be with family thinks.  She has been out of CPAP since being in rehab.  And supposedly is supposed to be on that.  Hospitalist will see her and admit.  Urinalysis is pending.   Vanetta Mulders, MD 04/15/22 2204

## 2022-04-15 NOTE — ED Notes (Signed)
Pt stating that RN must stay in her room or else we will all blow up, pt also requesting I get her ready to leave at this very moment. Pt educated that she is being admitted into the hospital to help figure out why she is confused

## 2022-04-15 NOTE — ED Provider Notes (Signed)
[facility] EMERGENCY DEPARTMENT Provider Note   CSN: [id number] Arrival date & time: 04/15/22  1241     History  Chief Complaint  Patient presents with   Hallucinations    Tamara Russell is a 61 y.o. female.  The history is provided by the patient, the spouse and medical records. The history is limited by the condition of the patient. No language interpreter was used.  Altered Mental Status Presenting symptoms: confusion and disorientation   Presenting symptoms: no partial responsiveness and no unresponsiveness   Severity:  Severe Most recent episode:  More than 2 days ago Episode history:  Continuous Timing:  Constant Progression:  Worsening Chronicity:  New Context: recent illness   Context: not dementia and not recent change in medication   Associated symptoms: agitation (intermit4ent) and hallucinations   Associated symptoms: no abdominal pain, no fever, no headaches, no light-headedness, no nausea, no palpitations, no rash, no vomiting and no weakness        Home Medications Prior to Admission medications   Medication Sig Start Date End Date Taking? Authorizing Provider  acetaminophen (TYLENOL) 500 MG tablet Take 1 tablet (500 mg total) by mouth every 6 (six) hours as needed for moderate pain. Reported on 04/21/2016 Patient taking differently: Take 500 mg by mouth every 6 (six) hours as needed for moderate pain. 02/05/22   Barnetta Chapel, MD  apixaban (ELIQUIS) 5 MG TABS tablet Take 1 tablet (5 mg total) by mouth 2 (two) times daily. Patient taking differently: Take 5 mg by mouth every evening. 03/17/22 04/16/22  Arrien, York Ram, MD  atorvastatin (LIPITOR) 40 MG tablet Take 1 tablet (40 mg total) by mouth daily. Patient taking differently: Take 40 mg by mouth at bedtime. 02/06/22 04/14/22  Berton Mount I, MD  dapagliflozin propanediol (FARXIGA) 10 MG TABS tablet Take 1 tablet (10 mg total) by mouth daily. 03/17/22   Arrien, York Ram, MD  dapagliflozin propanediol (FARXIGA) 10 MG TABS tablet Take 10 mg by mouth daily.    [provider]  diclofenac Sodium (VOLTAREN) 1 % GEL Apply 2 g topically 3 (three) times daily. To bilateral knees as needed. Patient taking differently: Apply 2 g topically daily as needed (knee pain). 03/17/22   Arrien, York Ram, MD  diphenhydramine-acetaminophen (TYLENOL PM) 25-500 MG TABS tablet Take 1 tablet by mouth at bedtime as needed (pain).    [provider]  ferrous sulfate 324 MG TBEC Take 324 mg by mouth daily with breakfast.    [provider]  irbesartan (AVAPRO) 75 MG tablet Take 0.5 tablets (37.5 mg total) by mouth daily. Patient taking differently: Take 75 mg by mouth daily. 03/18/22 04/17/22  Arrien, York Ram, MD  Melatonin 10 MG TABS Take 10 mg by mouth at bedtime.    [provider]  metoprolol succinate (TOPROL-XL) 25 MG 24 hr tablet Take 1 tablet (25 mg total) by mouth daily. 03/18/22 04/17/22  Arrien, York Ram, MD  Multiple Vitamins-Minerals (CENTRUM SILVER ADULT 50+ PO) Take 1 tablet by mouth daily.    [provider]  potassium chloride 20 MEQ TBCR Take 20 mEq by mouth daily. 04/14/22   Cantwell, Celeste C, PA-C      Allergies    Penicillins    Review of Systems   Review of Systems  Constitutional:  Negative for chills, fatigue and fever.  HENT:  Negative for congestion.   Eyes:  Negative for visual disturbance.  Respiratory:  Negative for cough, chest tightness,  shortness of breath and wheezing.   Cardiovascular:  Negative for chest pain and palpitations.  Gastrointestinal:  Negative for abdominal pain, constipation, diarrhea, nausea and vomiting.  Genitourinary:  Negative for dysuria, flank pain and frequency.  Musculoskeletal:  Negative for back pain, neck pain and neck stiffness.  Skin:  Negative for rash and wound.  Neurological:  Negative for dizziness, weakness, light-headedness, numbness and headaches.   Psychiatric/Behavioral:  Positive for agitation (intermit4ent), confusion and hallucinations.   All other systems reviewed and are negative.   Physical Exam Updated Vital Signs Ht 5\' 1"  (1.549 m)   Wt 98.1 kg   SpO2 96%   BMI 40.86 kg/m  Physical Exam Vitals and nursing note reviewed.  Constitutional:      General: She is not in acute distress.    Appearance: She is well-developed. She is not ill-appearing, toxic-appearing or diaphoretic.  HENT:     Head: Normocephalic and atraumatic.     Nose: No congestion or rhinorrhea.     Mouth/Throat:     Mouth: Mucous membranes are moist.     Pharynx: No oropharyngeal exudate or posterior oropharyngeal erythema.  Eyes:     Conjunctiva/sclera: Conjunctivae normal.     Pupils: Pupils are equal, round, and reactive to light.  Cardiovascular:     Rate and Rhythm: Normal rate and regular rhythm.     Heart sounds: No murmur heard. Pulmonary:     Effort: Pulmonary effort is normal. No respiratory distress.     Breath sounds: Normal breath sounds. No wheezing, rhonchi or rales.  Chest:     Chest wall: No tenderness.  Abdominal:     General: Abdomen is flat.     Palpations: Abdomen is soft.     Tenderness: There is no abdominal tenderness. There is no right CVA tenderness, left CVA tenderness, guarding or rebound.  Musculoskeletal:        General: No swelling or tenderness.     Cervical back: Neck supple. No tenderness.     Right lower leg: No edema.     Left lower leg: No edema.  Skin:    General: Skin is warm and dry.     Capillary Refill: Capillary refill takes less than 2 seconds.     Findings: No erythema.  Neurological:     Mental Status: She is alert.     Sensory: No sensory deficit.     Motor: No weakness.  Psychiatric:        Mood and Affect: Mood normal.     ED Results / Procedures / Treatments   Labs (all labs ordered are listed, but only abnormal results are displayed) Labs Reviewed  CBC WITH  DIFFERENTIAL/PLATELET - Abnormal; Notable for the following components:      Result Value   Hemoglobin 11.6 (*)    MCH 24.2 (*)    RDW 18.6 (*)    All other components within normal limits  COMPREHENSIVE METABOLIC PANEL - Abnormal; Notable for the following components:   Potassium 3.3 (*)    BUN <5 (*)    Total Protein 6.0 (*)    Albumin 2.9 (*)    All other components within normal limits  I-STAT ARTERIAL BLOOD GAS, ED - Abnormal; Notable for the following components:   pCO2 arterial 54.6 (*)    pO2, Arterial 50 (*)    Bicarbonate 35.3 (*)    TCO2 37 (*)    Acid-Base Excess 9.0 (*)    Potassium 3.1 (*)    All  other components within normal limits  URINE CULTURE  ETHANOL  AMMONIA  TSH  BRAIN NATRIURETIC PEPTIDE  URINALYSIS, ROUTINE W REFLEX MICROSCOPIC  RAPID URINE DRUG SCREEN, HOSP PERFORMED  BLOOD GAS, ARTERIAL    EKG EKG Interpretation  Date/Time:  Saturday April 15 2022 13:59:10 EDT Ventricular Rate:  87 PR Interval:  148 QRS Duration: 68 QT Interval:  350 QTC Calculation: 421 R Axis:   -18 Text Interpretation: Normal sinus rhythm Minimal voltage criteria for LVH, may be normal variant ( R in aVL ) Anterior infarct , age undetermined Abnormal ECG When compared with ECG of 22-Feb-2022 09:33, PREVIOUS ECG IS PRESENT when compared to prior, similar appearance. No STEMI Confirmed by Theda Belfast ([id number]) on 04/15/2022 2:18:18 PM  Radiology DG Chest Portable 1 View  Result Date: 04/15/2022 CLINICAL DATA:  Altered mental status EXAM: PORTABLE CHEST 1 VIEW COMPARISON:  Portable exam 1328 hours compared to 03/10/2022 FINDINGS: Monitoring device projects over LEFT chest. Minimal enlargement of cardiac silhouette. Mediastinal contours and pulmonary vascularity normal. Bibasilar atelectasis. No acute infiltrate, pleural effusion, pneumothorax or acute osseous findings. IMPRESSION: Bibasilar atelectasis. Electronically Signed   By: Ulyses Southward M.D.   On: 04/15/2022 13:43     Procedures Procedures    Medications Ordered in ED Medications  haloperidol lactate (HALDOL) injection 5 mg (5 mg Intramuscular Given 04/15/22 1336)    ED Course/ Medical Decision Making/ A&P                           Medical Decision Making Amount and/or Complexity of Data Reviewed Labs: ordered. Radiology: ordered.  Risk Prescription drug management.    Tamara Russell is a 61 y.o. female with a past medical history significant for CHF, pneumonia, previous DVT on Eliquis, GERD, peptic ulcer disease with previous GI bleeding, and recent prolonged admission for UTI and altered mental status who presents again for delirium.  According to family, she was discharged from rehab facility a week ago and this is the second visit in the last 3 days for worsening mental status and delirium.  According to family she is getting more and more confused disoriented and getting agitated at times.  They were seen several days ago and told she needs to follow-up with her PCP however in the last 3 days she is now getting more agitated and more confused.  She denies any urinary changes, cough, congestion, chest pain, or shortness of breath.  They do feel that her right leg is more swollen than it was before although chart review shows that she had a negative DVT ultrasound several days ago.  She also had both recent CT of her head 2 days ago that did not show any acute intracranial abnormality and recent MRI of her brain that was reassuring during her recent admission.  They deny any trauma.  She reports has been taking all her medicines.  Patient is very confused and delirious of unclear etiology.  When I spoke to the patient, she told me that "we need to leave in the limousine and get out of here".  Family reports that she has been having with auditory and visual hallucinations that are worsening despite outpatient management.  They are concerned because she was told during her admission that she needs  outpatient CPAP to help prevent her CO2 getting higher and leading to confusion.  On my exam, lungs were clear and chest was nontender.  Abdomen was nontender.  Back was nontender.  Patient moving all extremities.  Legs are edematous right worse than left however she is not having any leg pain.  Patient otherwise very pleasantly confused and resting.  Clinically I am concerned that the patient is having worsened delirium of unclear etiology.  As she recently had CT head and MRI brain, do not feel she needs a new imaging at this time given her lack of new trauma or any focal neurologic deficits.  For her right leg swelling, she recently had a DVT ultrasound was negative several days ago and as this is similar, do not feel she needs repeat ultrasound however I do feel need to get repeat labs to look for other causes of altered mental status.  We will get an ABG to rule out a hypercarbia contributing to the altered mental status.  Due to the worsened delirium and confusion and agitation I do not feel patient will be safe for discharge home.  Anticipate admission for further management after work-up is completed.  1:33 PM When nursing tried to get an IV placed patient got agitated and uncooperative.  I spoke with husband who agrees about the medication for the patient to help her relax and complete the work-up.  Given the suspected acute delirium, I do not feel a benzo be best for her.  We will give her Haldol.  Husband also feels that Haldol would be the best thing to help her which has worked in the past.  We will give IM Haldol and then get her work-up started         Final Clinical Impression(s) / ED Diagnoses Final diagnoses:  Hallucinations  Delirium     Clinical Impression: 1. Hallucinations   2. Delirium     Disposition: Admit  This note was prepared with assistance of Dragon voice recognition software. Occasional wrong-word or sound-a-like substitutions may have occurred due to  the inherent limitations of voice recognition software.     Lora Chavers, Canary Brim, MD 04/15/22 [phone]

## 2022-04-15 NOTE — H&P (Signed)
History and Physical    PLEASE NOTE THAT DRAGON DICTATION SOFTWARE WAS USED IN THE CONSTRUCTION OF THIS NOTE.   Tamara Russell [place] TWS:[id number] DOB: September 02, 1961 DOA: 04/15/2022  PCP: Tamara Lei, MD  Patient coming from: home   I have personally briefly reviewed patient's old medical records in [place]  Chief Complaint: Altered mental status  HPI: Tamara Russell is a 61 y.o. female with medical history significant for chronic hypoxic hypercapnic respiratory failure on 2 L continuous nasal cannula, essential hypertension, hyperlipidemia, chronic diastolic heart failure chronic iron deficiency anemia associated baseline hemoglobin range 11-13, left lower extremity DVT diagnosed in May 2023 who is admitted to [facility] on 04/15/2022 with acute encephalopathy after presenting from home to [facility] ED for evaluation of altered mental status.   In the setting of the patient's altered mental status, the following history is provided by the patient's family, discussions with the EDP, and via chart review.  The patient was reportedly hospitalized from 02/01/2022 to 02/05/2022 for acute encephalopathy.  There was reportedly associated concern for contribution from hypercapnia in the context of the patient's morbid obesity.  The patient was subsequently discharged to SNF for temporary rehab, during which time family reports that the patient was able to use nocturnal CPAP in the setting of her obesity hypoventilation syndrome and obstructive sleep apnea.  They reportedly noted that the patient's mental status remained at baseline throughout her nearly 5-week course at SNF for temporary rehab, while using nocturnal CPAP throughout.    She was subsequently discharged to home from SNF on 03/17/2022, although the family reports that there has been delays in delivery of home CPAP machine and associated accessories/equipment to the patient's home.  Consequently, she has been without any form of  noninvasive mechanical ventilation since the time of discharge from SNF on 03/17/2022.  Family notes that over the last 1 to 2 weeks, the patient has been exhibiting progressive evidence of confusion, paranoia, agitation, very similar to that which she was experiencing leading up to her admission for acute encephalopathy in April 2023.  In this context, the patient was brought to [facility] emergency department on 04/12/2022 for further evaluation management of her progressive confusion, paranoia, agitation.  Work-up at [facility], ED at that time included CT head which showed no evidence of acute intracranial process.  Urinalysis without microscopy was obtained, and ensuing associated urine culture grew E. coli and Klebsiella pneumonia.  Has not been initiated on antibiotics in the interval.  No reported fever at home.  The patient was subsequently discharged to home from [facility] ED, although the family has noted no significant interval improvement in the patient's confusion, altered mental status, prompting them to bring the patient to [facility] emergency department this evening for further evaluation and management thereof.  Medical history also notable for chronic diastolic heart failure, with most recent echocardiogram on 02/04/2022 demonstrating LVEF 60 to 65%, no focal wall motion normalities, grade 1 diastolic dysfunction, normal right ventricular systolic function and no evidence of significant valvular pathology.  She was diagnosed with acute DVT in the left lower extremity per venous Doppler on 03/05/2022.  She has subsequently been on anticoagulation with Eliquis, without any reported interval trauma/falls.   No report of alcohol abuse or recent alcohol consumption, and no history of recreational drug use.  She is a former smoker, having completely quit smoking in the mid 1990s after smoking over the previous 20 years.  No known diagnosis of underlying  COPD.     ED Course:  Vital signs in  the ED were notable for the following: Afebrile; heart rate 62-90; blood pressure 125/75 - 160/93; respiratory rate 14-23, oxygen saturation 99 to 100% on baseline 2 L nasal cannula.  Labs were notable for the following: I-STAT ABG showed the following: 7.419/54.6.  CMP notable for the following: Sodium 140, potassium 3.3, bicarbonate 30, creatinine 0.46 compared to 0.47 on 04/04/2022, glucose 97, calcium, adjusted for mild hypoalbuminemia noted to be 10.0, albumin 2.9, otherwise liver enzymes found to be within normal limits.  Ammonia 27, TSH 1.798, serum ethanol level less than 10.  CBC notable for the following: Will with cell count 4300, hemoglobin 11.6 associate with normocytic/normochromic findings and relative demonstration prior hemoglobin data point is 12.6 on 04/12/2022.  Additionally, the following studies have been ordered, with results currently pending: Full ABG, reflex urinalysis, urinary drug screen.  Imaging and additional notable ED work-up: EKG shows sinus rhythm with heart rate 87, normal intervals, no evidence of T wave or ST changes, including no evidence of ST elevation.  Chest x-ray showed bibasilar atelectasis, but otherwise showed no evidence of acute cardiopulmonary process, including no evidence of infiltrate, edema, effusion, or pneumothorax.  While in the ED, the following were administered: Haldol 5 mg IV x2 doses.  Subsequently, the patient was admitted for further evaluation and management of acute encephalopathy of unclear source.      Review of Systems: As per HPI otherwise 10 point review of systems negative.   Past Medical History:  Diagnosis Date   Acid reflux    Chronic diastolic heart failure (HCC)    Chronic respiratory failure with hypoxia and hypercapnia (HCC)    on 2 L [place] continuous   Complication of anesthesia    slow to wake up   DVT (deep venous thrombosis) (HCC)    of LLE ; dx'ed on 03/05/22   History of COVID-19    HLD (hyperlipidemia)     Hypertension     Past Surgical History:  Procedure Laterality Date   ABDOMINAL HYSTERECTOMY     BIOPSY  11/17/2018   Procedure: BIOPSY;  Surgeon: Jerene Bears, MD;  Location: [facility] ENDOSCOPY;  Service: Gastroenterology;;   BREAST EXCISIONAL BIOPSY Left    benign   BREAST EXCISIONAL BIOPSY Right    benign   ESOPHAGOGASTRODUODENOSCOPY (EGD) WITH PROPOFOL N/A 11/17/2018   Procedure: ESOPHAGOGASTRODUODENOSCOPY (EGD) WITH PROPOFOL;  Surgeon: Jerene Bears, MD;  Location: [facility];  Service: Gastroenterology;  Laterality: N/A;    Social History:  reports that she quit smoking about 26 years ago. Her smoking use included cigarettes. She started smoking about 46 years ago. She has a 10.00 pack-year smoking history. She has never used smokeless tobacco. She reports that she does not drink alcohol and does not use drugs.   Allergies  Allergen Reactions   Penicillins Other (See Comments)    Childhood allergy. Pt does not remember reaction Tolerated cefazolin 5/1-5/6, cefepime 5/12-5/14    Family History  Problem Relation Age of Onset   Cancer Father 20       stomach   Hypertension Father    Diabetes Maternal Grandmother    Colon cancer Maternal Grandfather    Diabetes Paternal Grandmother    Colon cancer Paternal Grandmother     Family history reviewed and not pertinent    Prior to Admission medications   Medication Sig Start Date End Date Taking? Authorizing Provider  acetaminophen (TYLENOL) 500 MG tablet Take 1  tablet (500 mg total) by mouth every 6 (six) hours as needed for moderate pain. Reported on 04/21/2016 Patient taking differently: Take 500 mg by mouth every 6 (six) hours as needed for moderate pain. 02/05/22   Bonnell Public, MD  apixaban (ELIQUIS) 5 MG TABS tablet Take 1 tablet (5 mg total) by mouth 2 (two) times daily. Patient taking differently: Take 5 mg by mouth every evening. 03/17/22 04/16/22  Arrien, Jimmy Picket, MD  atorvastatin (LIPITOR) 40 MG tablet Take 1  tablet (40 mg total) by mouth daily. Patient taking differently: Take 40 mg by mouth at bedtime. 02/06/22 04/14/22  Dana Allan I, MD  dapagliflozin propanediol (FARXIGA) 10 MG TABS tablet Take 1 tablet (10 mg total) by mouth daily. 03/17/22   Arrien, Jimmy Picket, MD  dapagliflozin propanediol (FARXIGA) 10 MG TABS tablet Take 10 mg by mouth daily.    [provider]  diclofenac Sodium (VOLTAREN) 1 % GEL Apply 2 g topically 3 (three) times daily. To bilateral knees as needed. Patient taking differently: Apply 2 g topically daily as needed (knee pain). 03/17/22   Arrien, Jimmy Picket, MD  diphenhydramine-acetaminophen (TYLENOL PM) 25-500 MG TABS tablet Take 1 tablet by mouth at bedtime as needed (pain).    [provider]  ferrous sulfate 324 MG TBEC Take 324 mg by mouth daily with breakfast.    [provider]  irbesartan (AVAPRO) 75 MG tablet Take 0.5 tablets (37.5 mg total) by mouth daily. Patient taking differently: Take 75 mg by mouth daily. 03/18/22 04/17/22  Arrien, Jimmy Picket, MD  Melatonin 10 MG TABS Take 10 mg by mouth at bedtime.    [provider]  metoprolol succinate (TOPROL-XL) 25 MG 24 hr tablet Take 1 tablet (25 mg total) by mouth daily. 03/18/22 04/17/22  Arrien, Jimmy Picket, MD  Multiple Vitamins-Minerals (CENTRUM SILVER ADULT 50+ PO) Take 1 tablet by mouth daily.    [provider]  potassium chloride 20 MEQ TBCR Take 20 mEq by mouth daily. 04/14/22   Alethia Berthold, PA-C     Objective    Physical Exam: Vitals:   04/15/22 2045 04/15/22 2115 04/15/22 2139 04/15/22 2145  BP: (!) 154/78 (!) 152/81  (!) 131/91  Pulse: 65 65  92  Resp: 17 17  20   Temp:      TempSrc:      SpO2: 100% 100% 100% 99%  Weight:      Height:        General: appears to be stated age; confused; Skin: warm, dry, no rash Head:  AT/[place] Mouth:  Oral mucosa membranes appear moist, normal dentition Neck: supple; trachea midline Heart:  RRR;  did not appreciate any M/R/G Lungs: CTAB, did not appreciate any wheezes, rales, or rhonchi Abdomen: + BS; soft, ND, NT Vascular: 2+ pedal pulses b/l; 2+ radial pulses b/l Extremities: no peripheral edema, no muscle wasting Neuro:  In the setting of the patient's current mental status and associated inability to follow instructions, unable to perform full neurologic exam at this time.  As such, assessment of strength, sensation, and cranial nerves is limited at this time. Patient noted to spontaneously move all 4 extremities. No tremors.     Labs on Admission: I have personally reviewed following labs and imaging studies  CBC: Recent Labs  Lab 04/12/22 1338 04/15/22 1455 04/15/22 1520  WBC 4.1  --  4.3  NEUTROABS 2.4  --  2.5  HGB 12.6 12.9 11.6*  HCT 42.4 38.0 38.6  MCV  81.2  --  80.6  PLT 325  --  664   Basic Metabolic Panel: Recent Labs  Lab 04/12/22 1338 04/15/22 1455 04/15/22 1520  NA 142 139 140  K 3.0* 3.1* 3.3*  CL 101  --  100  CO2 34*  --  30  GLUCOSE 92  --  97  BUN <5*  --  <5*  CREATININE 0.47  --  0.46  CALCIUM 9.3  --  9.2  MG  --   --  1.6*   GFR: Estimated Creatinine Clearance: 77.2 mL/min (by C-G formula based on SCr of 0.46 mg/dL). Liver Function Tests: Recent Labs  Lab 04/12/22 1338 04/15/22 1520  AST 24 22  ALT 16 17  ALKPHOS 51 48  BILITOT 0.8 0.8  PROT 6.6 6.0*  ALBUMIN 3.3* 2.9*   No results for input(s): "LIPASE", "AMYLASE" in the last 168 hours. Recent Labs  Lab 04/12/22 1848 04/15/22 1701  AMMONIA 19 27   Coagulation Profile: Recent Labs  Lab 04/12/22 1338  INR 1.1   Cardiac Enzymes: Recent Labs  Lab 04/15/22 1520  CKTOTAL 101   BNP (last 3 results) No results for input(s): "PROBNP" in the last 8760 hours. HbA1C: No results for input(s): "HGBA1C" in the last 72 hours. CBG: Recent Labs  Lab 04/12/22 1445  GLUCAP 88   Lipid Profile: No results for input(s): "CHOL", "HDL", "LDLCALC", "TRIG", "CHOLHDL",  "LDLDIRECT" in the last 72 hours. Thyroid Function Tests: Recent Labs    04/15/22 1520  TSH 1.798   Anemia Panel: No results for input(s): "VITAMINB12", "FOLATE", "FERRITIN", "TIBC", "IRON", "RETICCTPCT" in the last 72 hours. Urine analysis:    Component Value Date/Time   COLORURINE YELLOW 04/12/2022 1624   APPEARANCEUR CLEAR 04/12/2022 1624   LABSPEC 1.016 04/12/2022 1624   PHURINE 7.0 04/12/2022 1624   GLUCOSEU NEGATIVE 04/12/2022 1624   HGBUR NEGATIVE 04/12/2022 1624   BILIRUBINUR NEGATIVE 04/12/2022 1624   KETONESUR NEGATIVE 04/12/2022 1624   PROTEINUR NEGATIVE 04/12/2022 1624   NITRITE NEGATIVE 04/12/2022 1624   LEUKOCYTESUR NEGATIVE 04/12/2022 1624    Radiological Exams on Admission: DG Chest Portable 1 View  Result Date: 04/15/2022 CLINICAL DATA:  Altered mental status EXAM: PORTABLE CHEST 1 VIEW COMPARISON:  Portable exam 1328 hours compared to 03/10/2022 FINDINGS: Monitoring device projects over LEFT chest. Minimal enlargement of cardiac silhouette. Mediastinal contours and pulmonary vascularity normal. Bibasilar atelectasis. No acute infiltrate, pleural effusion, pneumothorax or acute osseous findings. IMPRESSION: Bibasilar atelectasis. Electronically Signed   By: Lavonia Dana M.D.   On: 04/15/2022 13:43     EKG: Independently reviewed, with result as described above.    Assessment/Plan   Principal Problem:   Acute encephalopathy Active Problems:   DVT (deep venous thrombosis) (HCC)   Hypertension   Hypokalemia   Chronic iron deficiency anemia   Obesity, Class III, BMI 40-49.9 (morbid obesity) (HCC)   Chronic heart failure with preserved ejection fraction (HFpEF) (HCC)   HLD (hyperlipidemia)   Chronic respiratory failure with hypoxia and hypercapnia (HCC)       #) Acute encephalopathy: etiology not entirely clear at this time, although metabolic contributions are suspected, as further detailed below.  2 weeks of progressive confusion, paranoia, associate  with some agitation.  Per family, this is very similar to the constellation of symptoms that the patient presented with on 02/01/2022 at which time she was hospitalized for further evaluation of acute encephalopathy, which reportedly improved following initiation of CPAP in the setting of obesity hypoventilation syndrome/obstructive sleep  apnea.  Family conveys that the patient's mental status returned to baseline following initiation of CPAP, and that the patient remained at baseline mental status for the ensuing 5 weeks during which she was at [facility] for temporary rehab, noting that she was able to use nightly CPAP throughout that duration.  However, as a consequence delay of CPAP machine following discharge from SNF to home on 6-23, family is noted progressive confusion over the next 2 weeks.  This history appears to warrant further evaluation for contribution from hypercapnia, although the presenting i-STAT ABG shows evidence of chronic CO2 retention with appropriate compensation and normal pH.  Full ABG study has been ordered, with result currently pending.  Additionally, we will repeat a VBG in the morning to further evaluate for interval CO2 retention and potential contributions that moves to the patient's altered mental status. While patient's family conveys that the patient has been prescribed nocturnal cpap as outpatient, she may benefit more from BiPAP due to concerns over hypercapnia from obesity hypoventilation syndrome. Will perform additional blood gas analysis, as below, to further evaluate this.   Differential also includes the possibility of urinary tract infection as urine culture drawn on 04/04/2022 is subsequently proven E. coli as well as Klebsiella pneumonia.  Interpretation of such slightly complicated at this time given that associated urinalysis was performed without accompanying microscopy.  Therefore, it is unclear if there is no amount of contamination contributing to this urine culture  result.  Updated urinalysis has been ordered this evening to further evaluate.  I will modify this order from reflex to complete microscopy to assist with further reconciliation of this data.  Of note, source criteria not currently met for sepsis, and patient appears hemodynamically stable at this time.  Consequently, we will refrain from empiric initiation of IV antibiotics pending these updated urine studies to evaluate for true underlying urinary tract infection versus contaminant observed on urine culture from 04/12/2022.  No other overt source of underlying infectious process identified at this time, including chest x-ray which showed no evidence of acute cardiopulmonary process.  while differential includes acute ischemic CVA and seizures, these appear to be less likely at this time, while noting CT head on 04/12/2022 which showed no evidence of acute intracranial process, including no evidence of intracranial hemorrhage nor evidence of acute infarct.  Work-up in the emergency department today has been notable for normal TSH, ammonia level 27, and serum ethanol level less than 10.  will also check CPK level given that the patient is on high intensity atorvastatin as an outpatient.  will also check A1c level as the patient is on dapagliflozin, although I have not encountered documentation of a history of diabetes per my initial chart review, and no evidence of systolic dysfunction most recent echocardiogram.  No overt pharmacologic contributions, bleeding significantly central acting medications on outpatient medication list.  Not on any opioids.  We will pursue further evaluation for organic/medical factors they be contributing to her acute encephalopathy before potential consultation of psychiatry if this evaluation does not yield any specific metabolic sources.    Plan: fall precautions. Repeat CMP/CBC in the AM. Check magnesium level.  Follow-up result of full ABG, as above.  Check VBG in the a.m.  I placed a consult with the transition of care team to assist patient/family with reconciling the delayed delivery of her CPAP equipment, as above.  Change reflex urinalysis to urinalysis with complete microscopy, as above.  Holding off on antibiotics for UTI pending this result,  as further detailed above.  Follow-up for result of urinary drug screen.  Add on CPK level.  Delirium precautions ordered.  Check INR to evaluate hepatic synthetic function.  Add on procalcitonin level.        #) Hypokalemia: Presenting serum potassium level noted to be 3.3.  Plan: Potassium chloride 40 mEq IV over 4 hours x 1 now.  Add on serum magnesium level.  Repeat CMP in the morning.          #) Morbid obesity: Meets criteria for such in the setting of presenting BMI of 40.1.  Notable in the setting of the patient's chronic hypoxic hypercapnic respiratory failure due to suspected obesity hypoventilation syndrome, with report of additional diagnosis of obstructive sleep apnea, with patient reportedly to be on home nocturnal CPAP, as above.  Of note, TSH checked today found to be within normal limits.   Plan: Consult placed with transition of care team to assist with reconciliation at delivery of outpatient CPAP equipment, as above.  Check hemoglobin A1c.  Follow for results of ABG, with repeat VBG ordered for the morning, as further detailed above.          #) Chronic hypoxic hypercapnic respiratory failure:  documented history of such, on 2 L continuous nasal cannula at baseline, In the setting of obesity hypoventilation syndrome and report of obstructive sleep apnea, as above.  At time of the status presentation, O2 sats in the high 90s on baseline 2 L nasal cannula, while i-STAT ABG reflects chronic CO2 retention with appropriate metabolic compensation and normalized pH.  Chest x-ray shows bibasilar atelectasis, but otherwise no evidence of acute cardiopulmonary process.  Possible that there may be  an additional chronic contribution from undiagnosed COPD given the patient's 20-year smoking history.  I have not yet located any prior pulmonary function testing results per initial chart review.  Plan: Will pursue further blood gas analysis and outpatient CPAP equipment reconciliation, as detailed above.  Additional chart review to ascertain if patient has previous pulmonary function testing results available.  Consult with respiratory therapist placed for provision of outpatient nocturnal CPAP during this hospitalization.           #) Essential Hypertension: documented h/o such, with outpatient antihypertensive regimen including irbesartan, metoprolol succinate.  SBP's in the ED today: In the 120s to 160s mmHg.   Plan: Close monitoring of subsequent BP via routine VS. continue outpatient and hypertensive regimen.            #) Hyperlipidemia: documented h/o such. On high intensity atorvastatin as outpatient.    Plan: continue home statin.  Check CPK level in the context of presenting acute encephalopathy.              #) Chronic diastolic heart failure: documented history of such, with most recent echocardiogram performed in April 2023, which was notable for LVEF 60 to 65% and grade 1 diastolic dysfunction, with additional details as conveyed above. No clinical or radiographic evidence to suggest acutely decompensated heart failure at this time.  Not on any scheduled diuretic medications as an outpatient.   Plan: monitor strict I's & O's and daily weights. Repeat BMP in AM. Check serum mag level.           #) History of left lower extremity DVT: Diagnosed on 03/05/2022 via venous Doppler of the left lower extremity.  Has subsequently been on anticoagulation via Eliquis.  Plan: Continue outpatient Eliquis.         #):  Chronic iron deficiency anemia: Documented history of such, associated with baseline hemoglobin range of 11-13, on daily oral iron  supplementation at home.  Today's labs reflect baseline hemoglobin, with presentation not associated with any overt evidence of active bleed.  Plan: Repeat CBC in the morning.  Continue outpatient oral iron supplementation.  Check INR.        DVT prophylaxis: Continue outpatient Eliquis Code Status: Full code Family Communication : I discussed the patient's case with her daughter, who is present at bedside. Disposition Plan: Per Rounding Team Consults called: none;  Admission status: Inpatient    PLEASE NOTE THAT DRAGON DICTATION SOFTWARE WAS USED IN THE CONSTRUCTION OF THIS NOTE.   [place] DO [facility]  From [place]   04/15/2022, 11:13 PM

## 2022-04-15 NOTE — ED Notes (Signed)
Pt becoming agitated and yelling, pt insisting on going outside and pt states she needs to go now, pt educated that she connot go outside that she is admitted into the hospital and to stay in bed to decrease fall risk. Pt continues agitation, EDP notified and new orders received

## 2022-04-16 ENCOUNTER — Telehealth (HOSPITAL_BASED_OUTPATIENT_CLINIC_OR_DEPARTMENT_OTHER): Payer: Self-pay | Admitting: *Deleted

## 2022-04-16 DIAGNOSIS — G9340 Encephalopathy, unspecified: Secondary | ICD-10-CM | POA: Diagnosis not present

## 2022-04-16 DIAGNOSIS — E785 Hyperlipidemia, unspecified: Secondary | ICD-10-CM | POA: Diagnosis present

## 2022-04-16 DIAGNOSIS — J9611 Chronic respiratory failure with hypoxia: Secondary | ICD-10-CM | POA: Diagnosis present

## 2022-04-16 LAB — URINALYSIS, COMPLETE (UACMP) WITH MICROSCOPIC
Bilirubin Urine: NEGATIVE
Glucose, UA: NEGATIVE mg/dL
Hgb urine dipstick: NEGATIVE
Ketones, ur: 5 mg/dL — AB
Leukocytes,Ua: NEGATIVE
Nitrite: NEGATIVE
Protein, ur: NEGATIVE mg/dL
Specific Gravity, Urine: 1.011 (ref 1.005–1.030)
pH: 6 (ref 5.0–8.0)

## 2022-04-16 LAB — CBC WITH DIFFERENTIAL/PLATELET
Abs Immature Granulocytes: 0.01 10*3/uL (ref 0.00–0.07)
Basophils Absolute: 0 10*3/uL (ref 0.0–0.1)
Basophils Relative: 1 %
Eosinophils Absolute: 0.1 10*3/uL (ref 0.0–0.5)
Eosinophils Relative: 2 %
HCT: 37.7 % (ref 36.0–46.0)
Hemoglobin: 11.2 g/dL — ABNORMAL LOW (ref 12.0–15.0)
Immature Granulocytes: 0 %
Lymphocytes Relative: 23 %
Lymphs Abs: 1 10*3/uL (ref 0.7–4.0)
MCH: 24.7 pg — ABNORMAL LOW (ref 26.0–34.0)
MCHC: 29.7 g/dL — ABNORMAL LOW (ref 30.0–36.0)
MCV: 83 fL (ref 80.0–100.0)
Monocytes Absolute: 0.4 10*3/uL (ref 0.1–1.0)
Monocytes Relative: 9 %
Neutro Abs: 2.9 10*3/uL (ref 1.7–7.7)
Neutrophils Relative %: 65 %
Platelets: 237 10*3/uL (ref 150–400)
RBC: 4.54 MIL/uL (ref 3.87–5.11)
RDW: 18.5 % — ABNORMAL HIGH (ref 11.5–15.5)
WBC: 4.4 10*3/uL (ref 4.0–10.5)
nRBC: 0 % (ref 0.0–0.2)

## 2022-04-16 LAB — RAPID URINE DRUG SCREEN, HOSP PERFORMED
Amphetamines: NOT DETECTED
Barbiturates: NOT DETECTED
Benzodiazepines: NOT DETECTED
Cocaine: NOT DETECTED
Opiates: NOT DETECTED
Tetrahydrocannabinol: NOT DETECTED

## 2022-04-16 LAB — I-STAT ARTERIAL BLOOD GAS, ED
Acid-Base Excess: 7 mmol/L — ABNORMAL HIGH (ref 0.0–2.0)
Bicarbonate: 32.8 mmol/L — ABNORMAL HIGH (ref 20.0–28.0)
Calcium, Ion: 1.25 mmol/L (ref 1.15–1.40)
HCT: 39 % (ref 36.0–46.0)
Hemoglobin: 13.3 g/dL (ref 12.0–15.0)
O2 Saturation: 85 %
Patient temperature: 98.6
Potassium: 3.9 mmol/L (ref 3.5–5.1)
Sodium: 137 mmol/L (ref 135–145)
TCO2: 34 mmol/L — ABNORMAL HIGH (ref 22–32)
pCO2 arterial: 50.9 mmHg — ABNORMAL HIGH (ref 32–48)
pH, Arterial: 7.418 (ref 7.35–7.45)
pO2, Arterial: 50 mmHg — ABNORMAL LOW (ref 83–108)

## 2022-04-16 LAB — HEMOGLOBIN A1C
Hgb A1c MFr Bld: 4.7 % — ABNORMAL LOW (ref 4.8–5.6)
Mean Plasma Glucose: 88.19 mg/dL

## 2022-04-16 LAB — FOLATE: Folate: 31.6 ng/mL (ref >5.9)

## 2022-04-16 LAB — PROTIME-INR
INR: 1.2 (ref 0.8–1.2)
Prothrombin Time: 14.6 seconds (ref 11.4–15.2)

## 2022-04-16 LAB — VITAMIN B12: Vitamin B-12: 815 pg/mL (ref 180–914)

## 2022-04-16 LAB — PHOSPHORUS: Phosphorus: 4.5 mg/dL (ref 2.5–4.6)

## 2022-04-16 LAB — MAGNESIUM: Magnesium: 1.8 mg/dL (ref 1.7–2.4)

## 2022-04-16 MED ORDER — HYDRALAZINE HCL 20 MG/ML IJ SOLN
10.0000 mg | Freq: Four times a day (QID) | INTRAMUSCULAR | Status: DC | PRN
  Administered 2022-04-16: 10 mg via INTRAVENOUS
  Filled 2022-04-16: qty 1

## 2022-04-16 NOTE — Assessment & Plan Note (Signed)
 #)   Hypokalemia: Presenting serum potassium level noted to be 3.3.  Plan: Potassium chloride 40 mEq IV over 4 hours x 1 now.  Add on serum magnesium level.  Repeat CMP in the morning.   

## 2022-04-16 NOTE — Progress Notes (Signed)
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  She was recently hospitalized here on April for the same.  At that time,It was thought to be secondary to hypercapnia/obesity hypoventilation syndrome/OSA in the context of morbid obesity.  Patient was discharged to skilled nursing facility.  She was doing well at [facility] facility on CPAP machine but there has been a delay in the delivery of home CPAP machine after discharge and she has not been on any noninvasive mechanical ventilation since the time of discharge on 03/17/2022.  Patient has been exhibiting progressive confusion, paranoia, agitation.  She was recently presented at [facility] ED where CT head  did not show any acute abnormalities but she was found to have E. coli/UTI and Was Treated with Antibiotics.  I-STAT ABG Done on presentation This Time Showed PCO2 of 54.6.  Assessment & Plan:  Principal Problem:   Acute encephalopathy Active Problems:   DVT (deep venous thrombosis) (HCC)   Hypertension   Hypokalemia   Chronic iron deficiency anemia   Obesity, Class III, BMI 40-49.9 (morbid obesity) (HCC)   Chronic heart failure with preserved ejection fraction (HFpEF) (HCC)   HLD (hyperlipidemia)   Chronic respiratory failure with hypoxia and hypercapnia (HCC)   Acute encephalopathy: Presented with 2 weeks history of progressive confusion, paranoia, agitation.  Thought to be secondary to hypercapnia in the setting of OSA, OHS.  Similar presentation like in April when she was admitted and was discharged on CPAP.  She was also recently diagnosed with urinary tract infection and was treated with antibiotics.  CT head  did not show any acute intercurrent abnormalities.  TSH, ammonia level normal.  We will check vitamin B12, folic acid level.  MRI of the brain done on April of this year was normal.  Chronic hypoxic/hypercapnic respiratory failure/OHS/OSA: .  On 2 L of oxygen at home .Recommended to use CPAP.  Was using CPAP at a skilled nursing facility but could not get it after she was discharged from home.  We will involve TOC for consideration of delivery of CPAP machine for home.  Continue CPAP machine here. Monitor ABG.  Last ABG on 7/1 showed PCO2 of 54.6 with normal pH indicating compensation.  Recent history of UTI: Recent urine culture showed few colonies of Klebsiella/E. coli UTI.  Currently off antibiotics.repeat urine culture sent.  Denies any dysuria, no fever or leukocytosis.  Hypokalemia: Supplemented with potassium  Hypertension: Continue to monitor blood pressure.  Continue antihypertensives  Hyperlipidemia: Continue Lipitor  Chronic diastolic congestive heart failure: Recent echo showed EF of 60 to 65%, grade 1 diastolic dysfunction.  Currently euvolemic  History of left lower extremity DVT: Diagnosed on 03/05/2022.  Currently on Eliquis  Chronic iron-deficiency anemia: Hemoglobin stable  Morbid obesity: BMI 40.8    DVT prophylaxis:SCDs Start: 04/15/22 2212 apixaban (ELIQUIS) tablet 5 mg     Code Status: Full Code  Family Communication: Discussed with Daughter on phone om 7/2  Patient status: Inpatient  Patient is from : Home  Anticipated discharge to: Home  Estimated DC date: 2 to 3 days   Consultants: None  Procedures: None  Antimicrobials:  Anti-infectives (From admission, onward)    None       Subjective:  Patient seen and examined at the bedside this morning.  Hemodynamically stable.  Not in apparent distress.  Her mental symptoms has improved.  She knew that she is in the hospital and told the current month.  But he still appears  intermittently  confused.  Objective: Vitals:   04/16/22 0445 04/16/22 0500 04/16/22 0600 04/16/22 0700  BP: 119/74 127/72 140/82 (!) 144/77  Pulse: 72 76 65   Resp: 18 19 20  (!) 26  Temp:      TempSrc:      SpO2: 98% 99% 100%   Weight:      Height:       No intake or output data in the 24 hours ending 04/16/22 0748 Filed Weights   04/15/22 1255  Weight: 98.1 kg    Examination:  General exam: Overall comfortable, not in distress, obese HEENT: PERRL Respiratory system:  no wheezes or crackles  Cardiovascular system: S1 & S2 heard, RRR.  Gastrointestinal system: Abdomen is nondistended, soft and nontender. Central nervous system: Alert and awake, oriented to place, knew month  extremities: No edema, no clubbing ,no cyanosis Skin: No rashes, no ulcers,no icterus     Data Reviewed: I have personally reviewed following labs and imaging studies  CBC: Recent Labs  Lab 04/12/22 1338 04/15/22 1455 04/15/22 1520 04/16/22 0558  WBC 4.1  --  4.3 4.4  NEUTROABS 2.4  --  2.5 2.9  HGB 12.6 12.9 11.6* 11.2*  HCT 42.4 38.0 38.6 37.7  MCV 81.2  --  80.6 83.0  PLT 325  --  309 237   Basic Metabolic Panel: Recent Labs  Lab 04/12/22 1338 04/15/22 1455 04/15/22 1520 04/16/22 0558  NA 142 139 140 139  K 3.0* 3.1* 3.3* 4.4  CL 101  --  100 100  CO2 34*  --  30 32  GLUCOSE 92  --  97 83  BUN <5*  --  <5* 2*  CREATININE 0.47  --  0.46 0.48  CALCIUM 9.3  --  9.2 9.0  MG  --   --  1.6* 1.8  PHOS  --   --   --  4.5     Recent Results (from the past 240 hour(s))  Culture, blood (Routine X 2) w Reflex to ID Panel     Status: None (Preliminary result)   Collection Time: 04/12/22  2:05 PM   Specimen: BLOOD  Result Value Ref [place] Status   Specimen Description   Final    BLOOD LEFT ANTECUBITAL Performed at [facility], [address], [place], [place]    Special Requests   Final    BOTTLES DRAWN AEROBIC AND ANAEROBIC Blood Culture results may not be optimal  due to an inadequate volume of blood received in culture bottles Performed at [facility], [address], [place], [place]    Culture   Final    NO GROWTH 4 DAYS Performed at [facility], [address], [place], [place]    Report Status PENDING  Incomplete  Culture, blood (Routine X 2) w Reflex to ID Panel     Status: None (Preliminary result)   Collection Time: 04/12/22  2:05 PM   Specimen: BLOOD  Result Value Ref [place] Status   Specimen Description   Final    BLOOD BLOOD LEFT FOREARM Performed at [facility], [address] [place], [place]    Special Requests   Final  BOTTLES DRAWN AEROBIC AND ANAEROBIC Blood Culture results may not be optimal due to an inadequate volume of blood received in culture bottles Performed at [facility], [address] [place], [place] [address]    Culture   Final    NO GROWTH 4 DAYS Performed at [facility], [address] [place], [place] [address]    Report Status PENDING  Incomplete  Urine Culture     Status: Abnormal   Collection Time: 04/12/22  4:24 PM   Specimen: In/Out Cath Urine  Result Value Ref [place] Status   Specimen Description   Final    IN/OUT CATH URINE Performed at [facility], [address] [place], [place] [address]    Special Requests   Final    NONE Performed at [facility], [address] [place], [place] [address]    Culture (A)  Final    60,000 COLONIES/mL ESCHERICHIA COLI 20,000 COLONIES/mL KLEBSIELLA PNEUMONIAE    Report Status 04/15/2022 FINAL  Final   Organism ID, Bacteria ESCHERICHIA COLI (A)  Final   Organism ID, Bacteria KLEBSIELLA PNEUMONIAE (A)  Final      Susceptibility   Escherichia coli - MIC*    AMPICILLIN 8 SENSITIVE Sensitive     CEFAZOLIN <=4 SENSITIVE Sensitive     CEFEPIME <=0.12 SENSITIVE Sensitive     CEFTRIAXONE <=0.25 SENSITIVE Sensitive      CIPROFLOXACIN <=0.25 SENSITIVE Sensitive     GENTAMICIN <=1 SENSITIVE Sensitive     IMIPENEM <=0.25 SENSITIVE Sensitive     NITROFURANTOIN <=16 SENSITIVE Sensitive     TRIMETH/SULFA <=20 SENSITIVE Sensitive     AMPICILLIN/SULBACTAM 4 SENSITIVE Sensitive     PIP/TAZO <=4 SENSITIVE Sensitive     * 60,000 COLONIES/mL ESCHERICHIA COLI   Klebsiella pneumoniae - MIC*    AMPICILLIN >=32 RESISTANT Resistant     CEFAZOLIN <=4 SENSITIVE Sensitive     CEFEPIME <=0.12 SENSITIVE Sensitive     CEFTRIAXONE <=0.25 SENSITIVE Sensitive     CIPROFLOXACIN <=0.25 SENSITIVE Sensitive     GENTAMICIN <=1 SENSITIVE Sensitive     IMIPENEM 0.5 SENSITIVE Sensitive     NITROFURANTOIN 64 INTERMEDIATE Intermediate     TRIMETH/SULFA <=20 SENSITIVE Sensitive     AMPICILLIN/SULBACTAM 8 SENSITIVE Sensitive     PIP/TAZO <=4 SENSITIVE Sensitive     * 20,000 COLONIES/mL KLEBSIELLA PNEUMONIAE  Resp Panel by RT-PCR (Flu A&B, Covid) Anterior Nasal Swab     Status: None   Collection Time: 04/12/22  5:06 PM   Specimen: Anterior Nasal Swab  Result Value Ref [place] Status   SARS Coronavirus 2 by RT PCR NEGATIVE NEGATIVE Final    Comment: (NOTE) SARS-CoV-2 target nucleic acids are NOT DETECTED.  The SARS-CoV-2 RNA is generally detectable in upper respiratory specimens during the acute phase of infection. The lowest concentration of SARS-CoV-2 viral copies this assay can detect is 138 copies/mL. A negative result does not preclude SARS-Cov-2 infection and should not be used as the sole basis for treatment or other patient management decisions. A negative result may occur with  improper specimen collection/handling, submission of specimen other than nasopharyngeal swab, presence of viral mutation(s) within the areas targeted by this assay, and inadequate number of viral copies(<138 copies/mL). A negative result must be combined with clinical observations, patient history, and epidemiological information. The expected  result is Negative.  Fact Sheet for Patients:  BloggerCourse.com  Fact Sheet for Healthcare Providers:  SeriousBroker.it  This test is no t  yet approved or cleared by the [place] and  has been authorized for detection and/or diagnosis of SARS-CoV-2 by FDA under an Emergency Use Authorization (EUA). This EUA will remain  in effect (meaning this test can be used) for the duration of the COVID-19 declaration under Section 564(b)(1) of the Act, 21 U.S.C.section 360bbb-3(b)(1), unless the authorization is terminated  or revoked sooner.       Influenza A by PCR NEGATIVE NEGATIVE Final   Influenza B by PCR NEGATIVE NEGATIVE Final    Comment: (NOTE) The Xpert Xpress SARS-CoV-2/FLU/RSV plus assay is intended as an aid in the diagnosis of influenza from Nasopharyngeal swab specimens and should not be used as a sole basis for treatment. Nasal washings and aspirates are unacceptable for Xpert Xpress SARS-CoV-2/FLU/RSV testing.  Fact Sheet for Patients: BloggerCourse.com  Fact Sheet for Healthcare Providers: SeriousBroker.it  This test is not yet approved or cleared by the [place] FDA and has been authorized for detection and/or diagnosis of SARS-CoV-2 by FDA under an Emergency Use Authorization (EUA). This EUA will remain in effect (meaning this test can be used) for the duration of the COVID-19 declaration under Section 564(b)(1) of the Act, 21 U.S.C. section 360bbb-3(b)(1), unless the authorization is terminated or revoked.  Performed at [facility], [address], [place], [place] [address]      Radiology Studies: DG Chest Portable 1 View  Result Date: 04/15/2022 CLINICAL DATA:  Altered mental status EXAM: PORTABLE CHEST 1 VIEW COMPARISON:  Portable exam 1328 hours compared to 03/10/2022 FINDINGS: Monitoring device projects over LEFT chest.  Minimal enlargement of cardiac silhouette. Mediastinal contours and pulmonary vascularity normal. Bibasilar atelectasis. No acute infiltrate, pleural effusion, pneumothorax or acute osseous findings. IMPRESSION: Bibasilar atelectasis. Electronically Signed   By: Ulyses Southward M.D.   On: 04/15/2022 13:43    Scheduled Meds:  apixaban  5 mg Oral BID   atorvastatin  40 mg Oral Daily   ferrous sulfate  325 mg Oral Q breakfast   irbesartan  75 mg Oral Daily   metoprolol succinate  25 mg Oral Daily   Continuous Infusions:   LOS: 1 day   Burnadette Pop, MD [facility] P7/11/2021, 7:48 AM

## 2022-04-16 NOTE — Assessment & Plan Note (Signed)
 #)   Morbid obesity: Meets criteria for such in the setting of presenting BMI of 40.1.  Notable in the setting of the patient's chronic hypoxic hypercapnic respiratory failure due to suspected obesity hypoventilation syndrome, with report of additional diagnosis of obstructive sleep apnea, with patient reportedly to be on home nocturnal CPAP, as above.  Of note, TSH checked today found to be within normal limits.   Plan: Consult placed with transition of care team to assist with reconciliation at delivery of outpatient CPAP equipment, as above.  Check hemoglobin A1c.  Follow for results of ABG, with repeat VBG ordered for the morning, as further detailed above.

## 2022-04-16 NOTE — Assessment & Plan Note (Signed)
  #)   Hyperlipidemia: documented h/o such. On high intensity atorvastatin as outpatient.    Plan: continue home statin.  Check CPK level in the context of presenting acute encephalopathy.

## 2022-04-16 NOTE — Assessment & Plan Note (Signed)
  #)   Hypokalemia: Presenting serum potassium level noted to be 3.3.  Plan: Potassium chloride 40 mEq IV over 4 hours x 1 now.  Add on serum magnesium level.  Repeat CMP in the morning.

## 2022-04-16 NOTE — Assessment & Plan Note (Signed)
  #)   History of left lower extremity DVT: Diagnosed on 03/05/2022 via venous Doppler of the left lower extremity.  Has subsequently been on anticoagulation via Eliquis.  Plan: Continue outpatient Eliquis.

## 2022-04-16 NOTE — ED Notes (Signed)
RN attempted to give pt her morning medications, pt stated "I'm not ready for them yet, I will take them later".

## 2022-04-16 NOTE — Progress Notes (Signed)
Pt refusing to wear CPAP. RT attempted pt education, pt continues to refuse CPAP. RT will cont to monitor as needed.

## 2022-04-16 NOTE — Assessment & Plan Note (Signed)
#) Acute encephalopathy: etiology not entirely clear at this time, although metabolic contributions are suspected, as further detailed below.  2 weeks of progressive confusion, paranoia, associate with some agitation.  Per family, this is very similar to the constellation of symptoms that the patient presented with on 02/01/2022 at which time she was hospitalized for further evaluation of acute encephalopathy, which reportedly improved following initiation of CPAP in the setting of obesity hypoventilation syndrome/obstructive sleep apnea.  Family conveys that the patient's mental status returned to baseline following initiation of CPAP, and that the patient remained at baseline mental status for the ensuing 5 weeks during which she was at [facility] for temporary rehab, noting that she was able to use nightly CPAP throughout that duration.  However, as a consequence delay of CPAP machine following discharge from SNF to home on 6-23, family is noted progressive confusion over the next 2 weeks.  This history appears to warrant further evaluation for contribution from hypercapnia, although the presenting i-STAT ABG shows evidence of chronic CO2 retention with appropriate compensation and normal pH.  Full ABG study has been ordered, with result currently pending.  Additionally, we will repeat a VBG in the morning to further evaluate for interval CO2 retention and potential contributions that moves to the patient's altered mental status. While patient's family conveys that the patient has been prescribed nocturnal cpap as outpatient, she may benefit more from BiPAP due to concerns over hypercapnia from obesity hypoventilation syndrome. Will perform additional blood gas analysis, as below, to further evaluate this.   Differential also includes the possibility of urinary tract infection as urine culture drawn on 04/04/2022 is subsequently proven E. coli as well as Klebsiella pneumonia.  Interpretation of such slightly  complicated at this time given that associated urinalysis was performed without accompanying microscopy.  Therefore, it is unclear if there is no amount of contamination contributing to this urine culture result.  Updated urinalysis has been ordered this evening to further evaluate.  I will modify this order from reflex to complete microscopy to assist with further reconciliation of this data.  Of note, source criteria not currently met for sepsis, and patient appears hemodynamically stable at this time.  Consequently, we will refrain from empiric initiation of IV antibiotics pending these updated urine studies to evaluate for true underlying urinary tract infection versus contaminant observed on urine culture from 04/12/2022.  No other overt source of underlying infectious process identified at this time, including chest x-ray which showed no evidence of acute cardiopulmonary process.  while differential includes acute ischemic CVA and seizures, these appear to be less likely at this time, while noting CT head on 04/12/2022 which showed no evidence of acute intracranial process, including no evidence of intracranial hemorrhage nor evidence of acute infarct.  Work-up in the emergency department today has been notable for normal TSH, ammonia level 27, and serum ethanol level less than 10.  will also check CPK level given that the patient is on high intensity atorvastatin as an outpatient.  will also check A1c level as the patient is on dapagliflozin, although I have not encountered documentation of a history of diabetes per my initial chart review, and no evidence of systolic dysfunction most recent echocardiogram.  No overt pharmacologic contributions, bleeding significantly central acting medications on outpatient medication list.  Not on any opioids.  We will pursue further evaluation for organic/medical factors they be contributing to her acute encephalopathy before potential consultation of psychiatry if  this evaluation does not yield  any specific metabolic sources.    Plan: fall precautions. Repeat CMP/CBC in the AM. Check magnesium level.  Follow-up result of full ABG, as above.  Check VBG in the a.m. I placed a consult with the transition of care team to assist patient/family with reconciling the delayed delivery of her CPAP equipment, as above.  Change reflex urinalysis to urinalysis with complete microscopy, as above.  Holding off on antibiotics for UTI pending this result, as further detailed above.  Follow-up for result of urinary drug screen.  Add on CPK level.  Delirium precautions ordered.  Check INR to evaluate hepatic synthetic function.  Add on procalcitonin level.

## 2022-04-16 NOTE — Assessment & Plan Note (Signed)
 #):   Chronic iron deficiency anemia: Documented history of such, associated with baseline hemoglobin range of 11-13, on daily oral iron supplementation at home.  Today's labs reflect baseline hemoglobin, with presentation not associated with any overt evidence of active bleed.  Plan: Repeat CBC in the morning.  Continue outpatient oral iron supplementation.  Check INR.

## 2022-04-16 NOTE — Assessment & Plan Note (Signed)
   #)   Essential Hypertension: documented h/o such, with outpatient antihypertensive regimen including irbesartan, metoprolol succinate.  SBP's in the ED today: In the 120s to 160s mmHg.   Plan: Close monitoring of subsequent BP via routine VS. continue outpatient and hypertensive regimen.

## 2022-04-16 NOTE — Telephone Encounter (Signed)
Post ED Visit - Positive Culture Follow-up  Culture report reviewed by antimicrobial stewardship pharmacist: [name] []  , Pharm.D. []  Enzo Bi, Pharm.D., BCPS AQ-ID []  , Pharm.D., BCPS []  Celedonio Miyamoto, Pharm.D., BCPS []  [place], Garvin Fila.D., BCPS, AAHIVP []  , Pharm.D., BCPS, AAHIVP []  Georgina Pillion, PharmD, BCPS []  , PharmD, BCPS []  [place], PharmD, BCPS []  [address], PharmD []  , PharmD, BCPS []  Estella Husk, PharmD  Pharmacy Team []  Lysle Pearl, PharmD []  , PharmD []  Phillips Climes, PharmD []  , Rph []  Agapito Games) , PharmD []  Verlan Friends, PharmD []  , PharmD []  Mervyn Gay, PharmD []  , PharmD [x]  Vinnie Level, PharmD []  [facility], PharmD []  , PharmD []  Len Childs, PharmD   Positive urine culture Currently admitted at [facility]. Denies Dysuria, no fever and no further action needed  Greer Pickerel 04/16/2022, 3:06 PM

## 2022-04-16 NOTE — ED Notes (Signed)
Pt randomly yelling words - "fix her doctor", "[place]", "911", "free me" in repetitive speech pattern. Pt is able to answer all questions appropriately and does follow commands. Pt had 1 episode of crying where she was asking for her mother but is redirectable.

## 2022-04-16 NOTE — Assessment & Plan Note (Signed)
 #)   Chronic hypoxic hypercapnic respiratory failure:  documented history of such, on 2 L continuous nasal cannula at baseline, In the setting of obesity hypoventilation syndrome and report of obstructive sleep apnea, as above.  At time of the status presentation, O2 sats in the high 90s on baseline 2 L nasal cannula, while i-STAT ABG reflects chronic CO2 retention with appropriate metabolic compensation and normalized pH.  Chest x-ray shows bibasilar atelectasis, but otherwise no evidence of acute cardiopulmonary process.  Possible that there may be an additional chronic contribution from undiagnosed COPD given the patient's 20-year smoking history.  I have not yet located any prior pulmonary function testing results per initial chart review.  Plan: Will pursue further blood gas analysis and outpatient CPAP equipment reconciliation, as detailed above.  Additional chart review to ascertain if patient has previous pulmonary function testing results available.  Consult with respiratory therapist placed for provision of outpatient nocturnal CPAP during this hospitalization.

## 2022-04-17 ENCOUNTER — Inpatient Hospital Stay (HOSPITAL_COMMUNITY): Payer: 59

## 2022-04-17 DIAGNOSIS — G9340 Encephalopathy, unspecified: Secondary | ICD-10-CM | POA: Diagnosis not present

## 2022-04-17 LAB — COMPREHENSIVE METABOLIC PANEL
ALT: 15 U/L (ref 0–44)
AST: 30 U/L (ref 15–41)
Albumin: 2.5 g/dL — ABNORMAL LOW (ref 3.5–5.0)
Alkaline Phosphatase: 42 U/L (ref 38–126)
Anion gap: 7 (ref 5–15)
BUN: <5 mg/dL — ABNORMAL LOW (ref 8–23)
CO2: 32 mmol/L (ref 22–32)
Calcium: 9 mg/dL (ref 8.9–10.3)
Chloride: 100 mmol/L (ref 98–111)
Creatinine, Ser: 0.48 mg/dL (ref 0.44–1.00)
GFR, Estimated: >60 mL/min (ref >60)
Glucose, Bld: 83 mg/dL (ref 70–99)
Potassium: 4.4 mmol/L (ref 3.5–5.1)
Sodium: 139 mmol/L (ref 135–145)
Total Bilirubin: 0.6 mg/dL (ref 0.3–1.2)
Total Protein: 5.7 g/dL — ABNORMAL LOW (ref 6.5–8.1)

## 2022-04-17 LAB — URINE CULTURE

## 2022-04-17 LAB — PULMONARY FUNCTION TEST
FEF 25-75 Pre: 1.43 L/sec
FEF2575-%Pred-Pre: 69 %
FEV1-%Pred-Pre: 58 %
FEV1-Pre: 1.3 L
FEV1FVC-%Pred-Pre: 106 %
FEV6-%Pred-Pre: 57 %
FEV6-Pre: 1.57 L
FEV6FVC-%Pred-Pre: 104 %
FVC-%Pred-Pre: 54 %
FVC-Pre: 1.57 L
Pre FEV1/FVC ratio: 83 %
Pre FEV6/FVC Ratio: 100 %

## 2022-04-17 LAB — CULTURE, BLOOD (ROUTINE X 2)
Culture: NO GROWTH
Culture: NO GROWTH

## 2022-04-17 MED ORDER — AMLODIPINE BESYLATE 5 MG PO TABS
5.0000 mg | ORAL_TABLET | Freq: Every day | ORAL | Status: DC
  Administered 2022-04-17 – 2022-04-18 (×2): 5 mg via ORAL
  Filled 2022-04-17 (×2): qty 1

## 2022-04-17 MED ORDER — LABETALOL HCL 5 MG/ML IV SOLN
10.0000 mg | INTRAVENOUS | Status: DC | PRN
Start: 2022-04-17 — End: 2022-04-20
  Filled 2022-04-17: qty 4

## 2022-04-17 NOTE — TOC Initial Note (Signed)
Transition of Care [facility]) - Initial/Assessment Note    Patient Details  Name: Tamara Russell MRN: [id number] Date of Birth: 06/15/1961  Transition of Care [facility]) CM/SW Contact:    Harriet Masson, RN Phone Number: 04/17/2022, 3:30 PM  Clinical Narrative:                 Spoke to patient at bedside regarding Cpap. Patient currently has home 02 2L with [employer]. Spoke to [place] with [employer] who states patient will need an outpatient sleep study. Notified MD who requested patient discharge with NIV until patient can get the sleep study. Order for spirometry graph placed. This RNCM will notify Ian Malkin once test is complete.  This RNCM left VW with husband, awaiting return call.  Expected Discharge Plan: Home w Home Health Services Barriers to Discharge: Continued Medical Work up   Patient Goals and CMS Choice Patient states their goals for this hospitalization and ongoing recovery are:: return home      Expected Discharge Plan and Services Expected Discharge Plan: Home w Home Health Services       Living arrangements for the past 2 months: Single Family Home                                      Prior Living Arrangements/Services Living arrangements for the past 2 months: Single Family Home Lives with:: Spouse              Current home services: DME (home 02)    Activities of Daily Living      Permission Sought/Granted                  Emotional Assessment              Admission diagnosis:  Hallucinations [R44.3] Delirium [R41.0] Hypercapnia [R06.89] Acute encephalopathy [G93.40] Patient Active Problem List   Diagnosis Date Noted   HLD (hyperlipidemia)    Chronic respiratory failure with hypoxia and hypercapnia (HCC)    Acute encephalopathy 04/15/2022   DVT (deep venous thrombosis) (HCC) 03/05/2022   Aspiration pneumonia (HCC) 03/04/2022   Ileus (HCC) 03/04/2022   OSA (obstructive sleep apnea) 03/04/2022   Acute metabolic encephalopathy  02/06/2022   Chronic iron deficiency anemia 02/06/2022   Acute on chronic diastolic CHF (congestive heart failure) (HCC) 02/06/2022   Bradycardia    Sinus pause    Tachycardia    UTI (urinary tract infection)    Hypokalemia    Hypomagnesemia    Chronic heart failure with preserved ejection fraction (HFpEF) (HCC)    Elevated troponin level not due to acute coronary syndrome    Obesity, Class III, BMI 40-49.9 (morbid obesity) (HCC)    AMS (altered mental status) 02/02/2022   GI bleed 11/19/2018   Acute GI bleeding 11/17/2018   Hypertension    Acute blood loss anemia    Acute gastric ulcer with hemorrhage    PUD (peptic ulcer disease)    GERD (gastroesophageal reflux disease) 11/19/2015   AKI (acute kidney injury) (HCC) 11/19/2015   Acute bronchitis 11/19/2015   PCP:  [name], MD Pharmacy:   [facility] [phone] Otto, [place] - [address] AT [facility] [address] [place] [address] Phone: [phone] Fax: [phone]  [id number] [place], [place] - [address] AT [facility] [address] [place] [address] Phone: [phone]  Fax: [phone]     Social Determinants of Health (SDOH) Interventions    Readmission Risk Interventions     No data to display

## 2022-04-17 NOTE — Progress Notes (Signed)
CPAP trial at 2100. Pt. removed from face; this nurse reoriented and placed back on face. Notified at 0030 that CPAP had been removed by pt. again. CPAP placed back on face by another RN when daughter arrived. Pt. non-compliant with cpap unless someone is sitting at bedside to reorient.

## 2022-04-17 NOTE — Progress Notes (Signed)
RT placed patient on CPAP. She removed it within 2 minutes.

## 2022-04-17 NOTE — Progress Notes (Signed)
PROGRESS NOTE  Tamara Russell  HMC:[id number] DOB: 1961/06/07 DOA: 04/15/2022 PCP: [name], MD   Brief Narrative:  Patient is a 61 year old female with history of chronic hypoxic/hypercarbic respiratory failure on 2 L of oxygen, hypertension, hyperlipidemia, chronic diastolic CHF, iron deficiency anemia, left lower extremity DVT who presented with altered mental status from home.  She was recently hospitalized here on April for the same.  At that time,It was thought to be secondary to hypercapnia/obesity hypoventilation syndrome/OSA in the context of morbid obesity.  Patient was discharged to skilled nursing facility.  She was doing well at [facility] facility on CPAP machine but there has been a delay in the delivery of home CPAP machine after discharge and she has not been on any noninvasive mechanical ventilation since the time of discharge on 03/17/2022.  Patient has been exhibiting progressive confusion, paranoia, agitation.  She was recently presented at [facility] ED where CT head  did not show any acute abnormalities but she was found to have E. coli/UTI and Was Treated with Antibiotics.  I-STAT ABG Done on presentation This Time Showed PCO2 of 54.6. Mental status has improved.  PT/OT consulted  Assessment & Plan:  Principal Problem:   Acute encephalopathy Active Problems:   DVT (deep venous thrombosis) (HCC)   Hypertension   Hypokalemia   Chronic iron deficiency anemia   Obesity, Class III, BMI 40-49.9 (morbid obesity) (HCC)   Chronic heart failure with preserved ejection fraction (HFpEF) (HCC)   HLD (hyperlipidemia)   Chronic respiratory failure with hypoxia and hypercapnia (HCC)   Acute encephalopathy: Presented with 2 weeks history of progressive confusion, paranoia, agitation.  Thought to be secondary to hypercapnia in the setting of OSA, OHS.  Similar presentation like in April when she was admitted and was discharged on CPAP.  She was also recently diagnosed with urinary tract infection  and was treated with antibiotics.  CT head did not show any acute intercurrent abnormalities.  TSH, ammonia level normal.  No evidence of deficiency of vitamin B12, folic acid .  MRI of the brain done on April of this year was normal.  Mental status has significantly improved.  Chronic hypoxic/hypercapnic respiratory failure/OHS/OSA: .  On 2 L of oxygen at home .Recommended to use CPAP.  Was using CPAP at a skilled nursing facility but could not get it after she was discharged from home.  We will involve TOC for consideration of delivery of CPAP machine for home.  Continue CPAP machine here. Monitor ABG.  Last ABG on 7/1 showed PCO2 of 54.6 with normal pH indicating compensation.  Recent history of UTI: Recent urine culture showed few colonies of Klebsiella/E. coli UTI.  Currently off antibiotics.repeat urine culture sent here showed multiple species.  Denies any dysuria, no fever or leukocytosis.  Hypertension: Continue to monitor blood pressure.  Continue antihypertensives  Hyperlipidemia: Continue Lipitor  Chronic diastolic congestive heart failure: Recent echo showed EF of 60 to 65%, grade 1 diastolic dysfunction.  Currently euvolemic  History of left lower extremity DVT: Diagnosed on 03/05/2022.  Currently on Eliquis  Chronic iron-deficiency anemia: Hemoglobin stable  Morbid obesity: BMI 40.8  Debility/deconditioning: PT/OT consulted.  Daughter interested on home with home health    DVT prophylaxis:SCDs Start: 04/15/22 2212 apixaban (ELIQUIS) tablet 5 mg     Code Status: Full Code  Family Communication: Discussed with Daughter at bedside on 7/3  Patient status: Inpatient  Patient is from : Home  Anticipated discharge to: Home with home health  Estimated DC date:  2 to 3 days   Consultants: None  Procedures: None  Antimicrobials:  Anti-infectives (From admission, onward)    None       Subjective: Patient seen and examined at the bedside this morning.   Hemodynamically stable.  She was sleeping when I arrived.  As per the daughter, her mental status has significantly improved.  She did not have a good night sleep so she was sleeping .  Not in any kind of distress, looks comfortable  Objective: Vitals:   04/16/22 2345 04/17/22 0345 04/17/22 0400 04/17/22 0752  BP: (!) 171/90  (!) 181/98 (!) 193/95  Pulse: 99 90 85 94  Resp: (!) 34 (!) 23 (!) 22 15  Temp: 99 F (37.2 C) 99.7 F (37.6 C) 99.7 F (37.6 C) 98.5 F (36.9 C)  TempSrc: Oral Axillary Axillary Oral  SpO2: 92% 95% 99% 95%  Weight:      Height:        Intake/Output Summary (Last 24 hours) at 04/17/2022 1119 Last data filed at 04/17/2022 6948 Gross per 24 hour  Intake --  Output 1700 ml  Net -1700 ml   Filed Weights   04/15/22 1255  Weight: 98.1 kg    Examination:  General exam: Overall comfortable, not in distress,obese HEENT: PERRL Respiratory system:  no wheezes or crackles  Cardiovascular system: S1 & S2 heard, RRR.  Gastrointestinal system: Abdomen is nondistended, soft and nontender. Central nervous system: sleeping Extremities: No edema, no clubbing ,no cyanosis Skin: scaling on bilateral foot, no ulcers,no icterus    Data Reviewed: I have personally reviewed following labs and imaging studies  CBC: Recent Labs  Lab 04/12/22 1338 04/15/22 1455 04/15/22 1520 04/16/22 0558 04/16/22 1223  WBC 4.1  --  4.3 4.4  --   NEUTROABS 2.4  --  2.5 2.9  --   HGB 12.6 12.9 11.6* 11.2* 13.3  HCT 42.4 38.0 38.6 37.7 39.0  MCV 81.2  --  80.6 83.0  --   PLT 325  --  309 237  --    Basic Metabolic Panel: Recent Labs  Lab 04/12/22 1338 04/15/22 1455 04/15/22 1520 04/16/22 0558 04/16/22 1223  NA 142 139 140 139 137  K 3.0* 3.1* 3.3* 4.4 3.9  CL 101  --  100 100  --   CO2 34*  --  30 32  --   GLUCOSE 92  --  97 83  --   BUN <5*  --  <5* <5*  --   CREATININE 0.47  --  0.46 0.48  --   CALCIUM 9.3  --  9.2 9.0  --   MG  --   --  1.6* 1.8  --   PHOS  --   --    --  4.5  --      Recent Results (from the past 240 hour(s))  Culture, blood (Routine X 2) w Reflex to ID Panel     Status: None (Preliminary result)   Collection Time: 04/12/22  2:05 PM   Specimen: BLOOD  Result Value Ref Range Status   Specimen Description   Final    BLOOD LEFT ANTECUBITAL Performed at [facility], [address] [place], [place] [address]    Special Requests   Final    BOTTLES DRAWN AEROBIC AND ANAEROBIC Blood Culture results may not be optimal due to an inadequate volume of blood received in culture bottles Performed at [facility], [address] [place], [place] [address]  Culture   Final    NO GROWTH 4 DAYS Performed at [facility], [address], [place], [place] [address]    Report Status PENDING  Incomplete  Culture, blood (Routine X 2) w Reflex to ID Panel     Status: None (Preliminary result)   Collection Time: 04/12/22  2:05 PM   Specimen: BLOOD  Result Value Ref Range Status   Specimen Description   Final    BLOOD BLOOD LEFT FOREARM Performed at [facility], [address] [place], [place] [address]    Special Requests   Final    BOTTLES DRAWN AEROBIC AND ANAEROBIC Blood Culture results may not be optimal due to an inadequate volume of blood received in culture bottles Performed at [facility], [address] [place], [place] [address]    Culture   Final    NO GROWTH 4 DAYS Performed at [facility], [address] [place], [place] [address]    Report Status PENDING  Incomplete  Urine Culture     Status: Abnormal   Collection Time: 04/12/22  4:24 PM   Specimen: In/Out Cath Urine  Result Value Ref Range Status   Specimen Description   Final    IN/OUT CATH URINE Performed at [facility], [address] [place], [place] [address]    Special Requests   Final    NONE Performed at [facility], [address] [place], [place] [address]    Culture (A)  Final    60,000 COLONIES/mL ESCHERICHIA COLI 20,000 COLONIES/mL KLEBSIELLA PNEUMONIAE    Report Status 04/15/2022 FINAL  Final   Organism ID, Bacteria ESCHERICHIA COLI (A)  Final   Organism ID, Bacteria KLEBSIELLA PNEUMONIAE (A)  Final      Susceptibility   Escherichia coli - MIC*    AMPICILLIN 8 SENSITIVE Sensitive     CEFAZOLIN <=4 SENSITIVE Sensitive     CEFEPIME <=0.12 SENSITIVE Sensitive     CEFTRIAXONE <=0.25 SENSITIVE Sensitive     CIPROFLOXACIN <=0.25 SENSITIVE Sensitive     GENTAMICIN <=1 SENSITIVE Sensitive     IMIPENEM <=0.25 SENSITIVE Sensitive     NITROFURANTOIN <=16 SENSITIVE Sensitive     TRIMETH/SULFA <=20 SENSITIVE Sensitive     AMPICILLIN/SULBACTAM 4 SENSITIVE Sensitive     PIP/TAZO <=4 SENSITIVE Sensitive     * 60,000 COLONIES/mL ESCHERICHIA COLI   Klebsiella pneumoniae - MIC*    AMPICILLIN >=32 RESISTANT Resistant     CEFAZOLIN <=4 SENSITIVE Sensitive     CEFEPIME <=0.12 SENSITIVE Sensitive     CEFTRIAXONE <=0.25 SENSITIVE Sensitive     CIPROFLOXACIN <=0.25 SENSITIVE Sensitive     GENTAMICIN <=1 SENSITIVE Sensitive     IMIPENEM 0.5 SENSITIVE Sensitive     NITROFURANTOIN 64 INTERMEDIATE Intermediate     TRIMETH/SULFA <=20 SENSITIVE Sensitive     AMPICILLIN/SULBACTAM 8 SENSITIVE Sensitive     PIP/TAZO <=4 SENSITIVE Sensitive     * 20,000 COLONIES/mL KLEBSIELLA PNEUMONIAE  Resp Panel by RT-PCR (Flu A&B, Covid) Anterior Nasal Swab     Status: None   Collection Time: 04/12/22  5:06 PM   Specimen: Anterior Nasal Swab  Result Value Ref Range Status   SARS Coronavirus 2 by RT PCR NEGATIVE NEGATIVE Final    Comment: (NOTE) SARS-CoV-2 target nucleic acids are NOT DETECTED.  The SARS-CoV-2 RNA is generally detectable in upper respiratory specimens during the acute phase of infection. The lowest concentration of SARS-CoV-2 viral copies  this assay can detect is 138 copies/mL. A negative result does not preclude  SARS-Cov-2 infection and should not be used as the sole basis for treatment or other patient management decisions. A negative result may occur with  improper specimen collection/handling, submission of specimen other than nasopharyngeal swab, presence of viral mutation(s) within the areas targeted by this assay, and inadequate number of viral copies(<138 copies/mL). A negative result must be combined with clinical observations, patient history, and epidemiological information. The expected result is Negative.  Fact Sheet for Patients:  BloggerCourse.com  Fact Sheet for Healthcare Providers:  SeriousBroker.it  This test is no t yet approved or cleared by the [place] FDA and  has been authorized for detection and/or diagnosis of SARS-CoV-2 by FDA under an Emergency Use Authorization (EUA). This EUA will remain  in effect (meaning this test can be used) for the duration of the COVID-19 declaration under Section 564(b)(1) of the Act, 21 U.S.C.section 360bbb-3(b)(1), unless the authorization is terminated  or revoked sooner.       Influenza A by PCR NEGATIVE NEGATIVE Final   Influenza B by PCR NEGATIVE NEGATIVE Final    Comment: (NOTE) The Xpert Xpress SARS-CoV-2/FLU/RSV plus assay is intended as an aid in the diagnosis of influenza from Nasopharyngeal swab specimens and should not be used as a sole basis for treatment. Nasal washings and aspirates are unacceptable for Xpert Xpress SARS-CoV-2/FLU/RSV testing.  Fact Sheet for Patients: BloggerCourse.com  Fact Sheet for Healthcare Providers: SeriousBroker.it  This test is not yet approved or cleared by the [place] FDA and has been authorized for detection and/or diagnosis of SARS-CoV-2 by FDA under an Emergency Use Authorization (EUA). This EUA will remain in effect (meaning this test can be used) for the duration of  the COVID-19 declaration under Section 564(b)(1) of the Act, 21 U.S.C. section 360bbb-3(b)(1), unless the authorization is terminated or revoked.  Performed at [facility], [address] [place], [place] [address]   Urine Culture     Status: Abnormal   Collection Time: 04/16/22  4:27 AM   Specimen: Urine, Clean Catch  Result Value Ref Range Status   Specimen Description URINE, CLEAN CATCH  Final   Special Requests   Final    NONE Performed at [facility], [address], [place], [place] [address]    Culture MULTIPLE SPECIES PRESENT, SUGGEST RECOLLECTION (A)  Final   Report Status 04/17/2022 FINAL  Final     Radiology Studies: DG Chest Portable 1 View  Result Date: 04/15/2022 CLINICAL DATA:  Altered mental status EXAM: PORTABLE CHEST 1 VIEW COMPARISON:  Portable exam 1328 hours compared to 03/10/2022 FINDINGS: Monitoring device projects over LEFT chest. Minimal enlargement of cardiac silhouette. Mediastinal contours and pulmonary vascularity normal. Bibasilar atelectasis. No acute infiltrate, pleural effusion, pneumothorax or acute osseous findings. IMPRESSION: Bibasilar atelectasis. Electronically Signed   By: Ulyses Southward M.D.   On: 04/15/2022 13:43    Scheduled Meds:  amLODipine  5 mg Oral Daily   apixaban  5 mg Oral BID   atorvastatin  40 mg Oral Daily   ferrous sulfate  325 mg Oral Q breakfast   irbesartan  75 mg Oral Daily   metoprolol succinate  25 mg Oral Daily   Continuous Infusions:   LOS: 2 days   Burnadette Pop, MD [facility] P7/12/2021, 11:19 AM

## 2022-04-17 NOTE — Progress Notes (Signed)
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  The use of the NIV will treat this patient's ventilatory defects found in her recent PFT and high PCO2 levels (50.9 with an elevated bicarb of 32.8 on 04/16/22) and can reduce risk of exacerbations and future hospitalizations when used at night and during the day. All alternate devices [phone] and [id number]) been considered and ruled out as patient requires continuous alarms, backup battery, and portability which are not possible with BiLevel/RAD devices.  An NIV with volume-targeted pressure support is necessary to prevent patient from life-threatening harm.  Interruption or failure to provide NIV would quickly lead to exacerbation of the patient's condition, hospital re-admission, and likely harm to the patient. Continued use is preferred.  Patient is able to protect their airways and clear secretions on their own.

## 2022-04-17 NOTE — Progress Notes (Signed)
Admitted pt to telemetry.

## 2022-04-17 NOTE — Progress Notes (Signed)
CPAP removed from face at time unknown. This RN called to pt. room to put CPAP back on.

## 2022-04-18 DIAGNOSIS — G9340 Encephalopathy, unspecified: Secondary | ICD-10-CM | POA: Diagnosis not present

## 2022-04-18 MED ORDER — AMLODIPINE BESYLATE 10 MG PO TABS
10.0000 mg | ORAL_TABLET | Freq: Every day | ORAL | Status: DC
  Administered 2022-04-19: 10 mg via ORAL
  Filled 2022-04-18: qty 1

## 2022-04-18 NOTE — Progress Notes (Signed)
PROGRESS NOTE  Tamara Russell  KGM:[id number] DOB: 1961/10/05 DOA: 04/15/2022 PCP: [name], MD   Brief Narrative:  Patient is a 61 year old female with history of chronic hypoxic/hypercarbic respiratory failure on 2 L of oxygen, hypertension, hyperlipidemia, chronic diastolic CHF, iron deficiency anemia, left lower extremity DVT who presented with altered mental status from home.  She was recently hospitalized here on April for the same.  At that time,It was thought to be secondary to hypercapnia/obesity hypoventilation syndrome/OSA in the context of morbid obesity.  Patient was discharged to skilled nursing facility.  She was doing well at [facility] facility on CPAP machine but there has been a delay in the delivery of home CPAP machine after discharge and she has not been on any noninvasive mechanical ventilation since the time of discharge on 03/17/2022.  Patient has been exhibiting progressive confusion, paranoia, agitation.  She was recently presented at [facility] ED where CT head  did not show any acute abnormalities but she was found to have E. coli/UTI and Was Treated with Antibiotics.  I-STAT ABG Done on presentation Showed PCO2 of 54.6. Mental status has improved but she is still intermittently confused.  PT/OT consulted and recommended home health.  TOC working to arrange NIV at home.  Plan for discharge after management of an NIV.  Assessment & Plan:  Principal Problem:   Acute encephalopathy Active Problems:   DVT (deep venous thrombosis) (HCC)   Hypertension   Hypokalemia   Chronic iron deficiency anemia   Obesity, Class III, BMI 40-49.9 (morbid obesity) (HCC)   Chronic heart failure with preserved ejection fraction (HFpEF) (HCC)   HLD (hyperlipidemia)   Chronic respiratory failure with hypoxia and hypercapnia (HCC)   Acute encephalopathy: Presented with 2 weeks history of progressive confusion, paranoia, agitation.  Thought to be secondary to hypercapnia in the setting of OSA, OHS.   Similar presentation like in April when she was admitted and was discharged on CPAP.  She was also recently diagnosed with urinary tract infection and was treated with antibiotics.  CT head did not show any acute intercurrent abnormalities.  TSH, ammonia level normal.  No evidence of deficiency of vitamin B12, folic acid .  MRI of the brain done on April of this year was normal.  Mental status has significantly improved but she is still intermittently confused, impulsive.  No other etiology found.  Continue to monitor.  Chronic hypoxic/hypercapnic respiratory failure/OHS/OSA: .  On 2 L of oxygen at home .Recommended to use CPAP.  Was using CPAP at a skilled nursing facility but could not get it after she was discharged from home.  We involved TOC for consideration of delivery of an NIV/CPAP machine for home.  Continue CPAP machine here. Check abg tomorrow  Recent history of UTI: Recent urine culture showed few colonies of Klebsiella/E. coli UTI.  Currently off antibiotics.repeat urine culture sent here showed multiple species.  Denies any dysuria, no fever or leukocytosis.  Hypertension: Continue to monitor blood pressure.  Continue antihypertensives  Hyperlipidemia: Continue Lipitor  Chronic diastolic congestive heart failure: Recent echo showed EF of 60 to 65%, grade 1 diastolic dysfunction.  Currently euvolemic  History of left lower extremity DVT: Diagnosed on 03/05/2022.  Currently on Eliquis  Chronic iron-deficiency anemia: Hemoglobin stable  Morbid obesity: BMI 40.8  Debility/deconditioning: PT/OT consulted.  Recommended home with home health    DVT prophylaxis:SCDs Start: 04/15/22 2212 apixaban (ELIQUIS) tablet 5 mg     Code Status: Full Code  Family Communication: Discussed with husband  at bedside on 7/4  Patient status: Inpatient  Patient is from : Home  Anticipated discharge to: Home with home health  Estimated DC date: As soon as NIV is arranged   Consultants:  None  Procedures: None  Antimicrobials:  Anti-infectives (From admission, onward)    None       Subjective: Patient seen and examined at the bedside this morning.  Husband at bedside.  She was working with OT.  She was adamant about leaving the hospital.  We discussed about importance of staying in the hospital until noninvasive ventilation is arranged for home.  Objective: Vitals:   04/18/22 0010 04/18/22 0035 04/18/22 0500 04/18/22 0802  BP:  (!) 142/78  (!) 157/86  Pulse:  89 99 92  Resp: (!) 30 (!) 24 (!) 21 20  Temp:  98.5 F (36.9 C) 98.7 F (37.1 C) 97.7 F (36.5 C)  TempSrc:  Oral Oral Oral  SpO2:  94% 100%   Weight:      Height:        Intake/Output Summary (Last 24 hours) at 04/18/2022 1135 Last data filed at 04/17/2022 2100 Gross per 24 hour  Intake --  Output 1200 ml  Net -1200 ml   Filed Weights   04/15/22 1255  Weight: 98.1 kg    Examination:  General exam: Overall comfortable, not in distress, morbidly obese HEENT: PERRL Respiratory system:  no wheezes or crackles  Cardiovascular system: S1 & S2 heard, RRR.  Gastrointestinal system: Abdomen is nondistended, soft and nontender. Central nervous system: Alert and oriented Extremities: No edema, no clubbing ,no cyanosis Skin: No rashes, no ulcers,no icterus    Data Reviewed: I have personally reviewed following labs and imaging studies  CBC: Recent Labs  Lab 04/12/22 1338 04/15/22 1455 04/15/22 1520 04/16/22 0558 04/16/22 1223  WBC 4.1  --  4.3 4.4  --   NEUTROABS 2.4  --  2.5 2.9  --   HGB 12.6 12.9 11.6* 11.2* 13.3  HCT 42.4 38.0 38.6 37.7 39.0  MCV 81.2  --  80.6 83.0  --   PLT 325  --  309 237  --    Basic Metabolic Panel: Recent Labs  Lab 04/12/22 1338 04/15/22 1455 04/15/22 1520 04/16/22 0558 04/16/22 1223  NA 142 139 140 139 137  K 3.0* 3.1* 3.3* 4.4 3.9  CL 101  --  100 100  --   CO2 34*  --  30 32  --   GLUCOSE 92  --  97 83  --   BUN <5*  --  <5* <5*  --    CREATININE 0.47  --  0.46 0.48  --   CALCIUM 9.3  --  9.2 9.0  --   MG  --   --  1.6* 1.8  --   PHOS  --   --   --  4.5  --      Recent Results (from the past 240 hour(s))  Culture, blood (Routine X 2) w Reflex to ID Panel     Status: None   Collection Time: 04/12/22  2:05 PM   Specimen: BLOOD  Result Value Ref Range Status   Specimen Description   Final    BLOOD LEFT ANTECUBITAL Performed at [facility], [place] [address], [place], [place] [address]    Special Requests   Final    BOTTLES DRAWN AEROBIC AND ANAEROBIC Blood Culture results may not be optimal due to an inadequate volume of blood received in  culture bottles Performed at [place] [address] [place], [place] [address]    Culture   Final    NO GROWTH 5 DAYS Performed at [facility], [place] [address], [place], [place] [address]    Report Status 04/17/2022 FINAL  Final  Culture, blood (Routine X 2) w Reflex to ID Panel     Status: None   Collection Time: 04/12/22  2:05 PM   Specimen: BLOOD  Result Value Ref Range Status   Specimen Description   Final    BLOOD BLOOD LEFT FOREARM Performed at [place] [address], [place], [place] [address]    Special Requests   Final    BOTTLES DRAWN AEROBIC AND ANAEROBIC Blood Culture results may not be optimal due to an inadequate volume of blood received in culture bottles Performed at [place] [address], [place], [place] [address]    Culture   Final    NO GROWTH 5 DAYS Performed at [facility], [place] [address], [place], [place] [address]    Report Status 04/17/2022 FINAL  Final  Urine Culture     Status: Abnormal   Collection Time: 04/12/22  4:24 PM   Specimen: In/Out Cath Urine  Result Value Ref Range Status   Specimen Description   Final    IN/OUT CATH URINE Performed at [place] [address] [place], [place] [address]    Special  Requests   Final    NONE Performed at [facility], [place] [address] [place], [place] [address]    Culture (A)  Final    60,000 COLONIES/mL ESCHERICHIA COLI 20,000 COLONIES/mL KLEBSIELLA PNEUMONIAE    Report Status 04/15/2022 FINAL  Final   Organism ID, Bacteria ESCHERICHIA COLI (A)  Final   Organism ID, Bacteria KLEBSIELLA PNEUMONIAE (A)  Final      Susceptibility   Escherichia coli - MIC*    AMPICILLIN 8 SENSITIVE Sensitive     CEFAZOLIN <=4 SENSITIVE Sensitive     CEFEPIME <=0.12 SENSITIVE Sensitive     CEFTRIAXONE <=0.25 SENSITIVE Sensitive     CIPROFLOXACIN <=0.25 SENSITIVE Sensitive     GENTAMICIN <=1 SENSITIVE Sensitive     IMIPENEM <=0.25 SENSITIVE Sensitive     NITROFURANTOIN <=16 SENSITIVE Sensitive     TRIMETH/SULFA <=20 SENSITIVE Sensitive     AMPICILLIN/SULBACTAM 4 SENSITIVE Sensitive     PIP/TAZO <=4 SENSITIVE Sensitive     * 60,000 COLONIES/mL ESCHERICHIA COLI   Klebsiella pneumoniae - MIC*    AMPICILLIN >=32 RESISTANT Resistant     CEFAZOLIN <=4 SENSITIVE Sensitive     CEFEPIME <=0.12 SENSITIVE Sensitive     CEFTRIAXONE <=0.25 SENSITIVE Sensitive     CIPROFLOXACIN <=0.25 SENSITIVE Sensitive     GENTAMICIN <=1 SENSITIVE Sensitive     IMIPENEM 0.5 SENSITIVE Sensitive     NITROFURANTOIN 64 INTERMEDIATE Intermediate     TRIMETH/SULFA <=20 SENSITIVE Sensitive     AMPICILLIN/SULBACTAM 8 SENSITIVE Sensitive     PIP/TAZO <=4 SENSITIVE Sensitive     * 20,000 COLONIES/mL KLEBSIELLA PNEUMONIAE  Resp Panel by RT-PCR (Flu A&B, Covid) Anterior Nasal Swab     Status: None   Collection Time: 04/12/22  5:06 PM   Specimen: Anterior Nasal Swab  Result Value Ref Range Status   SARS Coronavirus 2 by RT PCR NEGATIVE NEGATIVE Final    Comment: (NOTE) SARS-CoV-2 target nucleic acids are NOT DETECTED.  The SARS-CoV-2 RNA is generally  detectable in upper respiratory specimens during the acute phase of infection. The lowest concentration of SARS-CoV-2 viral  copies this assay can detect is 138 copies/mL. A negative result does not preclude SARS-Cov-2 infection and should not be used as the sole basis for treatment or other patient management decisions. A negative result may occur with  improper specimen collection/handling, submission of specimen other than nasopharyngeal swab, presence of viral mutation(s) within the areas targeted by this assay, and inadequate number of viral copies(<138 copies/mL). A negative result must be combined with clinical observations, patient history, and epidemiological information. The expected result is Negative.  Fact Sheet for Patients:  BloggerCourse.com  Fact Sheet for Healthcare Providers:  SeriousBroker.it  This test is no t yet approved or cleared by the [place] FDA and  has been authorized for detection and/or diagnosis of SARS-CoV-2 by FDA under an Emergency Use Authorization (EUA). This EUA will remain  in effect (meaning this test can be used) for the duration of the COVID-19 declaration under Section 564(b)(1) of the Act, 21 U.S.C.section 360bbb-3(b)(1), unless the authorization is terminated  or revoked sooner.       Influenza A by PCR NEGATIVE NEGATIVE Final   Influenza B by PCR NEGATIVE NEGATIVE Final    Comment: (NOTE) The Xpert Xpress SARS-CoV-2/FLU/RSV plus assay is intended as an aid in the diagnosis of influenza from Nasopharyngeal swab specimens and should not be used as a sole basis for treatment. Nasal washings and aspirates are unacceptable for Xpert Xpress SARS-CoV-2/FLU/RSV testing.  Fact Sheet for Patients: BloggerCourse.com  Fact Sheet for Healthcare Providers: SeriousBroker.it  This test is not yet approved or cleared by the [place] FDA and has been authorized for detection and/or diagnosis of SARS-CoV-2 by FDA under an Emergency Use Authorization (EUA). This  EUA will remain in effect (meaning this test can be used) for the duration of the COVID-19 declaration under Section 564(b)(1) of the Act, 21 U.S.C. section 360bbb-3(b)(1), unless the authorization is terminated or revoked.  Performed at [facility], [address], [place], [place] [address]   Urine Culture     Status: Abnormal   Collection Time: 04/16/22  4:27 AM   Specimen: Urine, Clean Catch  Result Value Ref Range Status   Specimen Description URINE, CLEAN CATCH  Final   Special Requests   Final    NONE Performed at [facility], [address] [place], [place] [address]    Culture MULTIPLE SPECIES PRESENT, SUGGEST RECOLLECTION (A)  Final   Report Status 04/17/2022 FINAL  Final     Radiology Studies: No results found.  Scheduled Meds:  [START ON 04/19/2022] amLODipine  10 mg Oral Daily   apixaban  5 mg Oral BID   atorvastatin  40 mg Oral Daily   ferrous sulfate  325 mg Oral Q breakfast   irbesartan  75 mg Oral Daily   metoprolol succinate  25 mg Oral Daily   Continuous Infusions:   LOS: 3 days   Burnadette Pop, MD [facility] P7/01/2022, 11:35 AM

## 2022-04-18 NOTE — Progress Notes (Signed)
Daughter called backthat she is on her way to be with patient.  Patient is in bed still aggravated with oxygen off and attempting to pull off tele.  Will continue to monitor.

## 2022-04-18 NOTE — Progress Notes (Signed)
Patient allowed this nurse to place her on CPAP.Patient verbalized ,"I don't know how long I will wear it. I would rather just keep my oxygen on."

## 2022-04-18 NOTE — TOC Progression Note (Signed)
Transition of Care [facility]) - Progression Note    Patient Details  Name: Tamara Russell MRN: [id number] Date of Birth: May 09, 1961  [facility] CM/SW Contact  Harriet Masson, RN Phone Number: 04/18/2022, 2:08 PM  Clinical Narrative:    Sherron Monday to daughter, Synetta Fail, regarding transition needs. Daughter states patient lives with her while she is on FMLA. Patient currently active with [facility] for pt/ot/rn. Spoke to [facility] with [facility] and added aide by daughter's request.  Need resumption home health orders Daughters's address: [address], [place], [place] [address] NIV ordered singed by MD emailed back to Kennedy with [employer]. This RNCM will follow up with [facility] tomorrow.   Expected Discharge Plan: Home w Home Health Services Barriers to Discharge: Continued Medical Work up  Expected Discharge Plan and Services Expected Discharge Plan: Home w Home Health Services       Living arrangements for the past 2 months: Single Family Home                           HH Arranged: RN, PT, OT, Nurse's Aide HH Agency: [facility] Date [facility] Agency Contacted: 04/18/22 Time HH Agency Contacted: [phone] Representative spoke with at [facility] Agency: Eber Jones   Social Determinants of Health (SDOH) Interventions    Readmission Risk Interventions     No data to display

## 2022-04-18 NOTE — Progress Notes (Signed)
Patient refusing CPAP.  Patient on 2L [place]. VSS.

## 2022-04-18 NOTE — Progress Notes (Signed)
Patient pulled off her gown and tele stating that she is leaving.  Refused treatment, became verbally abusive to staff stating that she is being held against her will.  Daughter, Synetta Fail called and was updated.  She stated that she is going to call her stepfather to see if he can come in to be with her or if not she will see if she can come in.  She is in bed will continue to monitor.

## 2022-04-18 NOTE — Evaluation (Signed)
Occupational Therapy Evaluation Patient Details Name: Tamara Russell MRN: [id number] DOB: October 23, 1960 Today's Date: 04/18/2022   History of Present Illness 61 y.o. female presents to [facility] hospital on 04/15/2022 with acute encephalopathy. Pt was also recently admitted for acute encephalopathy 4/19-4/23, started on CPAP. Pt with delay in receiving home CPAP. PMH includes acid reflux, diastolic heart failure, DVT, HLD, HTN.   Clinical Impression   Pt admitted for above and presents with problem list below, including impaired cognition, weakness, balance, decreased activity tolerance.  Per patient/significant others report, she has required assist with ADLs and has had limited mobility since last admission.  Today, she requires total assist for LB ADLS, min assist for ADLs and setup for grooming. Deferred transfers, but pt completed sit to stand using RW with max assist.  She presents with poor attention, awareness, recall, safety, and problem solving; requires constant redirection and multimodal cueing for simple commands. Significant other reports she has 24/7 support at home, no DME needs. Pt will benefit from further OT service acutely and after dc at [facility] [place] [facility] level to optimize independence, safety with ADLs and mobility. Will follow.      Recommendations for follow up therapy are one component of a multi-disciplinary discharge planning process, led by the attending physician.  Recommendations may be updated based on patient status, additional functional criteria and insurance authorization.   Follow Up Recommendations  Home health OT    Assistance Recommended at Discharge Frequent or constant Supervision/Assistance  Patient can return home with the following Two people to help with walking and/or transfers;Two people to help with bathing/dressing/bathroom;Assistance with cooking/housework;Direct supervision/assist for medications management;Direct supervision/assist for financial management;Assist for  transportation;Help with stairs or ramp for entrance    Functional Status Assessment  Patient has had a recent decline in their functional status and demonstrates the ability to make significant improvements in function in a reasonable and predictable amount of time.  Equipment Recommendations  None recommended by OT    Recommendations for Other Services       Precautions / Restrictions Precautions Precautions: Fall Restrictions Weight Bearing Restrictions: No Other Position/Activity Restrictions: \      Mobility Bed Mobility               General bed mobility comments: OOB in recliner upon entry    Transfers Overall transfer level: Needs assistance Equipment used: Rolling walker (2 wheels) Transfers: Sit to/from Stand Sit to Stand: Max assist           General transfer comment: max assist to power up from recliner with cueing for hand placement.  increased time to fully ascend, relies heavily on RW and external support.      Balance Overall balance assessment: Needs assistance Sitting-balance support: No upper extremity supported, Feet supported Sitting balance-Leahy Scale: Fair Sitting balance - Comments: limited dynamically   Standing balance support: Bilateral upper extremity supported, During functional activity Standing balance-Leahy Scale: Poor Standing balance comment: relies on BUE and external support                           ADL either performed or assessed with clinical judgement   ADL Overall ADL's : Needs assistance/impaired     Grooming: Set up;Sitting           Upper Body Dressing : Minimal assistance;Sitting   Lower Body Dressing: Total assistance;Sit to/from stand;Sitting/lateral leans;+2 for physical assistance;+2 for safety/equipment     Toilet Transfer Details (indicate cue type  and reason): deferred         Functional mobility during ADLs: Maximal assistance;Rolling walker (2 wheels) General ADL Comments:  limited by cognition, weakness and balance.     Vision   Vision Assessment?: No apparent visual deficits     Perception     Praxis      Pertinent Vitals/Pain Pain Assessment Pain Assessment: Faces Faces Pain Scale: Hurts a little bit Pain Location: R foot Pain Descriptors / Indicators: Stabbing, Sharp Pain Intervention(s): Monitored during session, Repositioned, Limited activity within patient's tolerance     Hand Dominance Right   Extremity/Trunk Assessment Upper Extremity Assessment Upper Extremity Assessment: Generalized weakness   Lower Extremity Assessment Lower Extremity Assessment: Defer to PT evaluation   Cervical / Trunk Assessment Cervical / Trunk Assessment: Normal   Communication Communication Communication: No difficulties   Cognition Arousal/Alertness: Awake/alert Behavior During Therapy: Impulsive Overall Cognitive Status: Impaired/Different from baseline Area of Impairment: Attention, Following commands, Safety/judgement, Awareness, Problem solving, Memory                   Current Attention Level: Sustained Memory: Decreased short-term memory, Decreased recall of precautions Following Commands: Follows one step commands inconsistently, Follows one step commands with increased time Safety/Judgement: Decreased awareness of safety, Decreased awareness of deficits Awareness: Intellectual Problem Solving: Slow processing, Decreased initiation, Difficulty sequencing, Requires verbal cues, Requires tactile cues General Comments: pt pleasantly confused, she requires frequent redirection to engage, hyperfocused on going home.  at times appears oriented to date (july 4th holiday) but then asks what today is within a few minutes.  Requires 1 step commands but follows inconsistently.     General Comments  on 2L O2 during session, VSS    Exercises     Shoulder Instructions      Home Living Family/patient expects to be discharged to:: Private  residence Living Arrangements: Children Available Help at Discharge: Family;Available 24 hours/day Type of Home: House Home Access: Stairs to enter     Home Layout: One level     Bathroom Shower/Tub: Chief Strategy Officer: Handicapped height     Home Equipment: Agricultural consultant (2 wheels);Wheelchair - manual;Hospital bed;Other (comment);BSC/3in1 (hoyer lift)          Prior Functioning/Environment Prior Level of Function : Needs assist       Physical Assist : Mobility (physical);ADLs (physical) Mobility (physical): Bed mobility;Transfers;Gait;Stairs ADLs (physical): Grooming;Bathing;Toileting Mobility Comments: Pt and family report that Pt was last able to stand/mobilize independently in April of 2023. REport using hoyer lift for transfers, and pt has been spending most of her time in the bed per spouse, just starting HHPT ADLs Comments: Children assist with ADLs and IADLs        OT Problem List: Decreased strength;Decreased activity tolerance;Impaired balance (sitting and/or standing);Decreased cognition;Decreased safety awareness;Decreased knowledge of use of DME or AE;Decreased knowledge of precautions;Pain;Obesity      OT Treatment/Interventions: Self-care/ADL training;Therapeutic exercise;DME and/or AE instruction;Therapeutic activities;Balance training;Patient/family education;Cognitive remediation/compensation    OT Goals(Current goals can be found in the care plan section) Acute Rehab OT Goals Patient Stated Goal: home OT Goal Formulation: With patient Time For Goal Achievement: 05/02/22 Potential to Achieve Goals: Good  OT Frequency: Min 2X/week    Co-evaluation              AM-PAC OT "6 Clicks" Daily Activity     Outcome Measure Help from another person eating meals?: A Little Help from another person taking care of personal grooming?: A Little  Help from another person toileting, which includes using toliet, bedpan, or urinal?: Total Help  from another person bathing (including washing, rinsing, drying)?: A Lot Help from another person to put on and taking off regular upper body clothing?: A Lot Help from another person to put on and taking off regular lower body clothing?: Total 6 Click Score: 12   End of Session Equipment Utilized During Treatment: Gait belt;Rolling walker (2 wheels) Nurse Communication: Mobility status  Activity Tolerance: Patient tolerated treatment well Patient left: in chair;with call bell/phone within reach;with chair alarm set;with family/visitor present  OT Visit Diagnosis: Other abnormalities of gait and mobility (R26.89);Muscle weakness (generalized) (M62.81);Other symptoms and signs involving cognitive function                Time: 1001-1026 OT Time Calculation (min): 25 min Charges:  OT General Charges $OT Visit: 1 Visit OT Evaluation $OT Eval Moderate Complexity: 1 Mod OT Treatments $Self Care/Home Management : 8-22 mins  Barry Brunner, OT Acute Rehabilitation Services Office [phone]   Chancy Milroy 04/18/2022, 11:46 AM

## 2022-04-18 NOTE — Evaluation (Addendum)
Physical Therapy Evaluation Patient Details Name: Tamara Russell MRN: [id number] DOB: 08-01-1961 Today's Date: 04/18/2022  History of Present Illness  61 y.o. female presents to [facility] hospital on 04/15/2022 with acute encephalopathy. Pt was also recently admitted for acute encephalopathy 4/19-4/23, started on CPAP. Pt with delay in receiving home CPAP. PMH includes acid reflux, diastolic heart failure, DVT, HLD, HTN.  Clinical Impression  Pt seen today with AMS and is a poor historian. Pt's spouse/ex-spouse assists in confirming history. PT deficits include strength and balance deficits that prevent the patient from mobilizing or performing ADLs without significant physical assistance. Pt also demonstrates frequent confusion and impaired safety awareness, needing supervision or assistance to prevent falls or other injuries. Pt transfers from chair to [facility] requiring two people for physical assistance, and transfers from [facility] to chair requiring significant physical assistance from one person. Continued therapy will facilitate the Pt in improving strength and balance so that she can progress towards mobilizing and performing activities with less assistance from family.     Recommendations for follow up therapy are one component of a multi-disciplinary discharge planning process, led by the attending physician.  Recommendations may be updated based on patient status, additional functional criteria and insurance authorization.  Follow Up Recommendations Home health PT      Assistance Recommended at Discharge Frequent or constant Supervision/Assistance  Patient can return home with the following  Two people to help with walking and/or transfers;Two people to help with bathing/dressing/bathroom;Assistance with cooking/housework;Direct supervision/assist for medications management;Direct supervision/assist for financial management;Assist for transportation;Help with stairs or ramp for entrance    Equipment  Recommendations BSC/3in1 (Need to confirm if Pt already has one)  Recommendations for Other Services       Functional Status Assessment Patient has had a recent decline in their functional status and demonstrates the ability to make significant improvements in function in a reasonable and predictable amount of time.     Precautions / Restrictions Precautions Precautions: Fall Restrictions Weight Bearing Restrictions: No Other Position/Activity Restrictions: \      Mobility  Bed Mobility               General bed mobility comments: Not assessed    Transfers Overall transfer level: Needs assistance Equipment used: None Transfers: Bed to chair/wheelchair/BSC, Sit to/from Stand Sit to Stand: Max assist, +2 physical assistance Stand pivot transfers: Mod assist         General transfer comment: PT recommends STEDY or +2 assist to Nurse for transfers    Ambulation/Gait               General Gait Details: Not assessed  Stairs            Wheelchair Mobility    Modified Rankin (Stroke Patients Only)       Balance Overall balance assessment: Needs assistance Sitting-balance support: No upper extremity supported, Feet supported Sitting balance-Leahy Scale: Fair     Standing balance support: Bilateral upper extremity supported (Reliant on PT physical assist for balance) Standing balance-Leahy Scale: Poor                               Pertinent Vitals/Pain Pain Assessment Pain Assessment: Faces Faces Pain Scale: Hurts a little bit Pain Location: R foot (Pt reports that she feels like her R foot has "burst open" and believes there is a wound; PT examines foot with no observable lesions) Pain Descriptors / Indicators: Stabbing, Lambert Mody  Pain Intervention(s): Monitored during session    Home Living Family/patient expects to be discharged to:: Private residence Living Arrangements: Children Available Help at Discharge: Family;Available  PRN/intermittently Type of Home: House Home Access: Stairs to enter       Home Layout: One level Home Equipment: Agricultural consultant (2 wheels);Wheelchair - manual;Hospital bed;Other (comment) (hoyer Lift)      Prior Function Prior Level of Function : Needs assist       Physical Assist : Mobility (physical);ADLs (physical) Mobility (physical): Bed mobility;Transfers;Gait;Stairs ADLs (physical): Grooming;Bathing;Toileting Mobility Comments: Pt and family report that Pt was last able to stand/mobilize independently in April of 2023. Pt has been spending most of her time in the bed per spouse, just starting HHPT ADLs Comments: Children assist with ADLs and IADLs     Hand Dominance        Extremity/Trunk Assessment   Upper Extremity Assessment Upper Extremity Assessment: Generalized weakness    Lower Extremity Assessment Lower Extremity Assessment: Generalized weakness    Cervical / Trunk Assessment Cervical / Trunk Assessment: Normal  Communication   Communication: Other (comment) (Pt with AMS; frequently goes off topic and has to be redirected towards questions and cues)  Cognition Arousal/Alertness: Awake/alert Behavior During Therapy: Impulsive Overall Cognitive Status: Impaired/Different from baseline                     Current Attention Level: Alternating   Following Commands: Follows one step commands inconsistently       General Comments: Pt generally confused and has difficulty following instructions / staying on task        General Comments General comments (skin integrity, edema, etc.): Pt received on 2L O2; 2L O2 via tank used during transfers    Exercises     Assessment/Plan    PT Assessment Patient needs continued PT services  PT Problem List Decreased strength;Decreased activity tolerance;Decreased balance;Decreased mobility;Decreased cognition;Obesity       PT Treatment Interventions DME instruction;Gait training;Stair  training;Functional mobility training;Therapeutic activities;Therapeutic exercise;Balance training;Patient/family education    PT Goals (Current goals can be found in the Care Plan section)  Acute Rehab PT Goals Patient Stated Goal: Return home and be able to stand and move again PT Goal Formulation: With patient Time For Goal Achievement: 05/02/22 Potential to Achieve Goals: Fair    Frequency Min 3X/week     Co-evaluation               AM-PAC PT "6 Clicks" Mobility  Outcome Measure Help needed turning from your back to your side while in a flat bed without using bedrails?: A Lot Help needed moving from lying on your back to sitting on the side of a flat bed without using bedrails?: A Lot Help needed moving to and from a bed to a chair (including a wheelchair)?: A Lot Help needed standing up from a chair using your arms (e.g., wheelchair or bedside chair)?: A Lot Help needed to walk in hospital room?: Total Help needed climbing 3-5 steps with a railing? : Total 6 Click Score: 10    End of Session Equipment Utilized During Treatment: Gait belt;Oxygen Activity Tolerance: Patient tolerated treatment well (Pt limited by strength and balance deficits) Patient left: in chair;with call bell/phone within reach;with chair alarm set;with family/visitor present Nurse Communication: Mobility status;Other (comment) (STEDY or +2 physical assist for transfers recommended) PT Visit Diagnosis: Unsteadiness on feet (R26.81);Muscle weakness (generalized) (M62.81);Difficulty in walking, not elsewhere classified (R26.2)    Time: 1660-6301 PT Time  Calculation (min) (ACUTE ONLY): 40 min   Charges:   PT Evaluation $PT Eval Low Complexity: 1 Low PT Treatments $Therapeutic Activity: 8-22 mins        Murlean Hark, SPT Acute Rehabilitation Office #: [phone]   Murlean Hark 04/18/2022, 11:02 AM

## 2022-04-19 DIAGNOSIS — J984 Other disorders of lung: Secondary | ICD-10-CM | POA: Diagnosis not present

## 2022-04-19 DIAGNOSIS — R410 Disorientation, unspecified: Secondary | ICD-10-CM

## 2022-04-19 DIAGNOSIS — I5032 Chronic diastolic (congestive) heart failure: Secondary | ICD-10-CM | POA: Diagnosis not present

## 2022-04-19 DIAGNOSIS — K922 Gastrointestinal hemorrhage, unspecified: Secondary | ICD-10-CM | POA: Diagnosis not present

## 2022-04-19 DIAGNOSIS — I10 Essential (primary) hypertension: Secondary | ICD-10-CM | POA: Diagnosis not present

## 2022-04-19 DIAGNOSIS — R4182 Altered mental status, unspecified: Secondary | ICD-10-CM | POA: Diagnosis not present

## 2022-04-19 DIAGNOSIS — R778 Other specified abnormalities of plasma proteins: Secondary | ICD-10-CM | POA: Diagnosis not present

## 2022-04-19 DIAGNOSIS — J9612 Chronic respiratory failure with hypercapnia: Secondary | ICD-10-CM | POA: Diagnosis not present

## 2022-04-19 DIAGNOSIS — J9611 Chronic respiratory failure with hypoxia: Secondary | ICD-10-CM | POA: Diagnosis not present

## 2022-04-19 DIAGNOSIS — E662 Morbid (severe) obesity with alveolar hypoventilation: Secondary | ICD-10-CM | POA: Diagnosis not present

## 2022-04-19 DIAGNOSIS — G9340 Encephalopathy, unspecified: Secondary | ICD-10-CM | POA: Diagnosis not present

## 2022-04-19 LAB — CBC
HCT: 36.9 % (ref 36.0–46.0)
Hemoglobin: 11.4 g/dL — ABNORMAL LOW (ref 12.0–15.0)
MCH: 24.4 pg — ABNORMAL LOW (ref 26.0–34.0)
MCHC: 30.9 g/dL (ref 30.0–36.0)
MCV: 78.8 fL — ABNORMAL LOW (ref 80.0–100.0)
Platelets: 291 10*3/uL (ref 150–400)
RBC: 4.68 MIL/uL (ref 3.87–5.11)
RDW: 18.3 % — ABNORMAL HIGH (ref 11.5–15.5)
WBC: 6.3 10*3/uL (ref 4.0–10.5)
nRBC: 0 % (ref 0.0–0.2)

## 2022-04-19 LAB — COMPREHENSIVE METABOLIC PANEL
ALT: 13 U/L (ref 0–44)
AST: 17 U/L (ref 15–41)
Albumin: 2.5 g/dL — ABNORMAL LOW (ref 3.5–5.0)
Alkaline Phosphatase: 54 U/L (ref 38–126)
Anion gap: 9 (ref 5–15)
BUN: 7 mg/dL — ABNORMAL LOW (ref 8–23)
CO2: 32 mmol/L (ref 22–32)
Calcium: 9.3 mg/dL (ref 8.9–10.3)
Chloride: 98 mmol/L (ref 98–111)
Creatinine, Ser: 0.47 mg/dL (ref 0.44–1.00)
GFR, Estimated: >60 mL/min (ref >60)
Glucose, Bld: 108 mg/dL — ABNORMAL HIGH (ref 70–99)
Potassium: 3.5 mmol/L (ref 3.5–5.1)
Sodium: 139 mmol/L (ref 135–145)
Total Bilirubin: 0.2 mg/dL — ABNORMAL LOW (ref 0.3–1.2)
Total Protein: 5.7 g/dL — ABNORMAL LOW (ref 6.5–8.1)

## 2022-04-19 NOTE — TOC Transition Note (Signed)
Transition of Care [facility]) - CM/SW Discharge Note   Patient Details  Name: Tamara Russell MRN: [id number] Date of Birth: [birth year]  [facility] CM/SW Contact:  Harriet Masson, RN Phone Number: 04/19/2022, 3:12 PM   Clinical Narrative:     Patient stable for discharge. Spoke to [place] with [employer] and NIV and portable tank will be delivered to the room. Eber Jones with [facility] notified of discharge. Husband , Dorene Sorrow, will transport patient home.    Final next level of care: Home w Home Health Services Barriers to Discharge: Barriers Resolved   Patient Goals and CMS Choice Patient states their goals for this hospitalization and ongoing recovery are:: return home CMS Medicare.gov Compare Post Acute Care list provided to:: Patient Choice offered to / list presented to : Patient  Discharge Placement                 home      Discharge Plan and Services                DME Arranged: NIV DME Agency: [employer] Date DME Agency Contacted: 04/17/22 Time DME Agency Contacted: 1530 Representative spoke with at DME Agency: Ian Malkin HH Arranged: RN, PT, OT, Nurse's Aide HH Agency: [facility] Date [facility] Agency Contacted: 04/18/22 Time HH Agency Contacted: [phone] Representative spoke with at [facility] Agency: Eber Jones  Social Determinants of Health (SDOH) Interventions     Readmission Risk Interventions    04/19/2022    3:11 PM  Readmission Risk Prevention Plan  Transportation Screening Complete  PCP or Specialist Appt within 3-5 Days Complete  HRI or Home Care Consult Complete  Social Work Consult for Recovery Care Planning/Counseling Complete  Palliative Care Screening Not Applicable  Medication Review Oceanographer) Complete

## 2022-04-19 NOTE — Plan of Care (Signed)
  Problem: Education: Goal: Knowledge of General Education information will improve Description: Including pain rating scale, medication(s)/side effects and non-pharmacologic comfort measures Outcome: Progressing   Problem: Clinical Measurements: Goal: Ability to maintain clinical measurements within normal limits will improve Outcome: Progressing   Problem: Activity: Goal: Risk for activity intolerance will decrease Outcome: Progressing   Problem: Nutrition: Goal: Adequate nutrition will be maintained Outcome: Progressing   Problem: Coping: Goal: Level of anxiety will decrease Outcome: Progressing   Problem: Elimination: Goal: Will not experience complications related to bowel motility Outcome: Progressing Goal: Will not experience complications related to urinary retention Outcome: Progressing   Problem: Pain Managment: Goal: General experience of comfort will improve Outcome: Progressing

## 2022-04-19 NOTE — Plan of Care (Signed)
  Problem: Education: Goal: Knowledge of General Education information will improve Description: Including pain rating scale, medication(s)/side effects and non-pharmacologic comfort measures Outcome: Not Progressing   Problem: Health Behavior/Discharge Planning: Goal: Ability to manage health-related needs will improve Outcome: Not Progressing   

## 2022-04-19 NOTE — Progress Notes (Signed)
Patient refused ABG.  Patient satting 100% on 2L [name].

## 2022-04-19 NOTE — Discharge Summary (Addendum)
PATIENT DETAILS Name: Tamara Russell Age: 61 y.o. Sex: female Date of Birth: 07/10/1961 MRN: [id number]. Admitting Physician: Angie Fava, DO YQM:VHQIO, Adrian Saran, MD  Admit Date: 04/15/2022 Discharge date: 04/19/2022  Recommendations for Outpatient Follow-up:  Follow up with PCP in 1-2 weeks Please obtain CMP/CBC in one week Needs outpatient sleep study   Admitted From:  Home  Disposition: Home health   Discharge Condition: fair  CODE STATUS:   Code Status: Full Code   Diet recommendation:  Diet Order             Diet - low sodium heart healthy           Diet regular Room service appropriate? 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  Brief Hospital Course: Acute metabolic encephalopathy: Suspect multifactorial etiology-some residual delirium/encephalopathy from her most recent hospitalization (treated empirically with IVIG for autoimmune encephalitis)-and possible hypercarbia (discharged from SNF without CPAP).  This  MD took care of this patient during her most recent hospitalization-she was significantly confused/paranoid then-her overall mental status is much better but she continues to have episodes of some confusion/paranoia.  Patient to continue outpatient follow-up with neurology/psychiatry and her primary care practitioner.  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  Remains stable on 2-3 L of oxygen.  PCP to arrange outpatient sleep study.    Recent history of UTI: Recent urine culture showed few colonies of Klebsiella/E. coli UTI.  Currently off antibiotics.repeat urine culture sent here showed multiple species.  Denies any dysuria, no fever or leukocytosis.   Hypertension: Continue to monitor blood pressure.  Continue antihypertensives   Hyperlipidemia: Continue Lipitor   Chronic diastolic congestive heart failure: Recent echo showed EF of 60 to 65%, grade 1 diastolic dysfunction.  Currently euvolemic   History of left lower extremity DVT: Diagnosed on 03/05/2022.  Currently on Eliquis   Chronic iron-deficiency anemia: Hemoglobin stable   Morbid obesity: BMI 40.8   Debility/deconditioning: PT/OT consulted.  Recommended home with home health    Discharge Diagnoses:  Principal Problem:   Acute encephalopathy Active Problems:   DVT (deep venous thrombosis) (HCC)   Hypertension   Hypokalemia   Chronic iron deficiency anemia   Obesity, Class III, BMI 40-49.9 (morbid obesity) (HCC)   Chronic heart failure with preserved ejection fraction (HFpEF) (HCC)   HLD (hyperlipidemia)   Chronic respiratory failure with hypoxia and hypercapnia (HCC)   Discharge Instructions:  Activity:  As tolerated    Discharge Instructions     Ambulatory referral to Neurology   Complete by: As directed    An appointment is requested in approximately: 4 weeks   Diet - low sodium heart healthy   Complete by: As directed    Discharge instructions   Complete by: As directed    Follow with Primary MD  [name], MD in 1-2 weeks  Use home O2 as previous  Use CPAP nightly  Please get a complete blood count and chemistry panel checked by your Primary MD at your next visit, and again as instructed by your Primary MD.  Get Medicines reviewed and adjusted: Please take all your medications with you for your next visit with your Primary MD  Laboratory/radiological data: Please request your Primary MD to go over all hospital tests and  procedure/radiological results at the follow up, please ask your Primary MD to get all Hospital records sent to his/her office.  In some cases, they will be blood work, cultures and biopsy results pending at the time of your discharge. Please request that your primary care M.D. follows up on these results.  Also Note the following: If you experience worsening of your admission symptoms, develop shortness of breath, life threatening emergency, suicidal or homicidal thoughts you must seek medical attention immediately by calling 911 or calling your MD immediately  if symptoms less severe.  You must read complete instructions/literature along with all the possible adverse reactions/side effects for all the Medicines you take and that have been prescribed to you. Take any new Medicines after you have completely understood and accpet all the possible adverse reactions/side effects.   Do not drive when taking Pain medications or sleeping medications (Benzodaizepines)  Do not take more than prescribed Pain, Sleep and Anxiety Medications. It is not advisable to combine anxiety,sleep and pain medications without talking with your primary care practitioner  Special Instructions: If you have smoked or chewed Tobacco  in the last 2 yrs please stop smoking, stop any regular Alcohol  and or any Recreational drug use.  Wear Seat belts while driving.  Please note: You were cared for by a hospitalist during your hospital stay. Once you are discharged, your primary care physician will handle any further medical issues. Please note that NO REFILLS for any discharge medications will be authorized once you are discharged, as it is imperative that you return to your primary care physician (or establish a relationship with a primary care physician if you do not have one) for your post hospital discharge needs so that they can reassess your need for medications and monitor your lab values.   Increase activity slowly    Complete by: As directed       Allergies as of 04/19/2022       Reactions   Penicillins Other (See Comments)   Childhood allergy. Pt does not remember reaction Tolerated cefazolin 5/1-5/6, cefepime 5/12-5/14        Medication List     TAKE these medications    acetaminophen 500 MG tablet Commonly known as: TYLENOL Take 1 tablet (500 mg total) by mouth every 6 (six) hours as needed for moderate pain. Reported on 04/21/2016 What changed:  how much to take reasons to take this additional instructions   apixaban 5 MG Tabs tablet Commonly known as: ELIQUIS Take 1 tablet (5 mg total) by mouth 2 (two) times daily. What changed: when to take this   atorvastatin 40 MG tablet Commonly known as: LIPITOR Take 1 tablet (40 mg total) by mouth daily. What changed: when to take this   CENTRUM SILVER ADULT 50+ PO Take 1 tablet by mouth daily.   dapagliflozin propanediol 10 MG Tabs tablet Commonly known as: FARXIGA Take 1 tablet (10 mg total) by mouth daily.   diclofenac Sodium 1 % Gel Commonly known as: VOLTAREN Apply 2 g topically 3 (three) times daily. To bilateral knees as needed. What changed:  when to take this reasons to take this additional instructions   diphenhydramine-acetaminophen 25-500 MG Tabs tablet Commonly known as: TYLENOL PM Take 1 tablet by mouth at bedtime as needed (pain/sleep).  ferrous sulfate 324 MG Tbec Take 324 mg by mouth every morning.   hydrocortisone-pramoxine 2.5-1 % rectal cream Commonly known as: ANALPRAM-HC Place 1 Application rectally daily as needed for hemorrhoids or anal itching.   irbesartan 75 MG tablet Commonly known as: AVAPRO Take 0.5 tablets (37.5 mg total) by mouth daily. What changed:  how much to take when to take this   Melatonin 10 MG Tabs Take 10 mg by mouth at bedtime.   metoprolol succinate 25 MG 24 hr tablet Commonly known as: TOPROL-XL Take 1 tablet (25 mg total) by mouth daily.   Potassium Chloride ER 20  MEQ Tbcr Take 20 mEq by mouth daily. What changed: when to take this   spironolactone 25 MG tablet Commonly known as: ALDACTONE Take 25 mg by mouth every morning.        Follow-up Information     Lucianne Lei, MD. Schedule an appointment as soon as possible for a visit in 1 week(s).   Specialty: Family Medicine Contact information: [address] [place] [address] [phone]         [facility] Follow up.   Specialty: Neurology Why: Office will call with date/time, If you dont hear from them,please give them a call Contact information: [place] [address] [phone]               Allergies  Allergen Reactions   Penicillins Other (See Comments)    Childhood allergy. Pt does not remember reaction Tolerated cefazolin 5/1-5/6, cefepime 5/12-5/14     Other Procedures/Studies: DG Chest Portable 1 View  Result Date: 04/15/2022 CLINICAL DATA:  Altered mental status EXAM: PORTABLE CHEST 1 VIEW COMPARISON:  Portable exam 1328 hours compared to 03/10/2022 FINDINGS: Monitoring device projects over LEFT chest. Minimal enlargement of cardiac silhouette. Mediastinal contours and pulmonary vascularity normal. Bibasilar atelectasis. No acute infiltrate, pleural effusion, pneumothorax or acute osseous findings. IMPRESSION: Bibasilar atelectasis. Electronically Signed   By: Lavonia Dana M.D.   On: 04/15/2022 13:43   CT HEAD WO CONTRAST  Result Date: 04/12/2022 CLINICAL DATA:  Mental status change. EXAM: CT HEAD WITHOUT CONTRAST TECHNIQUE: Contiguous axial images were obtained from the base of the skull through the vertex without intravenous contrast. RADIATION DOSE REDUCTION: This exam was performed according to the departmental dose-optimization program which includes automated exposure control, adjustment of the mA and/or kV according to patient size and/or use of iterative reconstruction technique. COMPARISON:   CT head 02/06/2022 MRI head 02/09/2022 FINDINGS: Brain: No evidence of acute infarction, hemorrhage, hydrocephalus, extra-axial collection or mass lesion/mass effect. Mild white matter hypodensity bilaterally. This is unchanged and chronic. Vascular: Negative for hyperdense vessel Skull: Negative Sinuses/Orbits: Mild mucosal edema paranasal sinuses. Negative orbit Other: None IMPRESSION: No acute abnormality. Mild white matter changes consistent with chronic microvascular ischemia. Electronically Signed   By: Franchot Gallo M.D.   On: 04/12/2022 14:48   US Venous Img Lower Bilateral (DVT)  Result Date: 04/11/2022 CLINICAL DATA:  A 61 year old female presents for evaluation of lower extremity swelling. EXAM: Bilateral LOWER EXTREMITY VENOUS DOPPLER ULTRASOUND TECHNIQUE: Gray-scale sonography with compression, as well as color and duplex ultrasound, were performed to evaluate the deep venous system(s) from the level of the common femoral vein through the popliteal and proximal calf veins. COMPARISON:  None Available. FINDINGS: VENOUS Normal compressibility of the common femoral, superficial femoral, and popliteal veins, as well as the visualized calf veins. Visualized portions of profunda femoral vein and great  saphenous vein unremarkable. No filling defects to suggest DVT on grayscale or color Doppler imaging. Doppler waveforms show normal direction of venous flow, normal respiratory plasticity and response to augmentation. Peroneal veins are not seen and there are signs of lower extremity edema. OTHER Bilateral lower extremity edema greatest in the lower leg. Limitations: Lower extremity edema limiting assessment of calf veins as discussed. IMPRESSION: Negative for sonographic evidence of DVT with limited assessment of the peroneal veins bilaterally. Electronically Signed   By: Donzetta Kohut M.D.   On: 04/11/2022 15:00     TODAY-DAY OF DISCHARGE:  Subjective:   Tamara Russell today has no headache,no  chest abdominal pain,no new weakness tingling or numbness, feels much better wants to go home today.   Objective:   Blood pressure (!) 144/78, pulse 68, temperature 97.7 F (36.5 C), temperature source Oral, resp. rate 20, height 5\' 1"  (1.549 m), weight 95.3 kg, SpO2 93 %.  Intake/Output Summary (Last 24 hours) at 04/19/2022 1524 Last data filed at 04/19/2022 1149 Gross per 24 hour  Intake 340 ml  Output 400 ml  Net -60 ml   Filed Weights   04/15/22 1255 04/19/22 0500  Weight: 98.1 kg 95.3 kg    Exam: Awake Alert, Oriented *3, No new F.N deficits, Normal affect Kirkpatrick.AT,PERRAL Supple Neck,No JVD, No cervical lymphadenopathy appriciated.  Symmetrical Chest wall movement, Good air movement bilaterally, CTAB RRR,No Gallops,Rubs or new Murmurs, No Parasternal Heave +ve B.Sounds, Abd Soft, Non tender, No organomegaly appriciated, No rebound -guarding or rigidity. No Cyanosis, Clubbing or edema, No new Rash or bruise   PERTINENT RADIOLOGIC STUDIES: No results found.   PERTINENT LAB RESULTS: CBC: Recent Labs    04/19/22 0144  WBC 6.3  HGB 11.4*  HCT 36.9  PLT 291   CMET CMP     Component Value Date/Time   NA 139 04/19/2022 0144   NA 140 03/05/2017 1449   K 3.5 04/19/2022 0144   CL 98 04/19/2022 0144   CO2 32 04/19/2022 0144   GLUCOSE 108 (H) 04/19/2022 0144   BUN 7 (L) 04/19/2022 0144   BUN 7 03/05/2017 1449   CREATININE 0.47 04/19/2022 0144   CREATININE 0.57 04/21/2016 1528   CALCIUM 9.3 04/19/2022 0144   PROT 5.7 (L) 04/19/2022 0144   ALBUMIN 2.5 (L) 04/19/2022 0144   AST 17 04/19/2022 0144   ALT 13 04/19/2022 0144   ALKPHOS 54 04/19/2022 0144   BILITOT 0.2 (L) 04/19/2022 0144   GFRNONAA >60 04/19/2022 0144   GFRNONAA >89 04/01/2015 1608   GFRAA >60 12/03/2019 2032   GFRAA >89 04/01/2015 1608    GFR Estimated Creatinine Clearance: 75.9 mL/min (by C-G formula based on SCr of 0.47 mg/dL). No results for input(s): "LIPASE", "AMYLASE" in the last 72 hours. No  results for input(s): "CKTOTAL", "CKMB", "CKMBINDEX", "TROPONINI" in the last 72 hours. Invalid input(s): "POCBNP" No results for input(s): "DDIMER" in the last 72 hours. No results for input(s): "HGBA1C" in the last 72 hours. No results for input(s): "CHOL", "HDL", "LDLCALC", "TRIG", "CHOLHDL", "LDLDIRECT" in the last 72 hours. No results for input(s): "TSH", "T4TOTAL", "T3FREE", "THYROIDAB" in the last 72 hours.  Invalid input(s): "FREET3" No results for input(s): "VITAMINB12", "FOLATE", "FERRITIN", "TIBC", "IRON", "RETICCTPCT" in the last 72 hours. Coags: No results for input(s): "INR" in the last 72 hours.  Invalid input(s): "PT" Microbiology: Recent Results (from the past 240 hour(s))  Culture, blood (Routine X 2) w Reflex to ID Panel     Status: None  Collection Time: 04/12/22  2:05 PM   Specimen: BLOOD  Result Value Ref Range Status   Specimen Description   Final    BLOOD LEFT ANTECUBITAL Performed at [facility] [address] [place], [place] [address]    Special Requests   Final    BOTTLES DRAWN AEROBIC AND ANAEROBIC Blood Culture results may not be optimal due to an inadequate volume of blood received in culture bottles Performed at [facility] [address], [place], [place] [address]    Culture   Final    NO GROWTH 5 DAYS Performed at [facility], [place] [address], [place], [place] [address]    Report Status 04/17/2022 FINAL  Final  Culture, blood (Routine X 2) w Reflex to ID Panel     Status: None   Collection Time: 04/12/22  2:05 PM   Specimen: BLOOD  Result Value Ref Range Status   Specimen Description   Final    BLOOD BLOOD LEFT FOREARM Performed at [facility] [address], [place], [place] [address]    Special Requests   Final    BOTTLES DRAWN AEROBIC AND ANAEROBIC Blood Culture results may not be optimal due to an inadequate volume of blood received in culture bottles Performed at  [facility] [address] [place], [place] [address]    Culture   Final    NO GROWTH 5 DAYS Performed at [facility], [place] [address], [place], [place] [address]    Report Status 04/17/2022 FINAL  Final  Urine Culture     Status: Abnormal   Collection Time: 04/12/22  4:24 PM   Specimen: In/Out Cath Urine  Result Value Ref Range Status   Specimen Description   Final    IN/OUT CATH URINE Performed at [facility] [address] [place], [place] [address]    Special Requests   Final    NONE Performed at [facility], [place] [address] [place],  [address]    Culture (A)  Final    60,000 COLONIES/mL ESCHERICHIA COLI 20,000 COLONIES/mL KLEBSIELLA PNEUMONIAE    Report Status 04/15/2022 FINAL  Final   Organism ID, Bacteria ESCHERICHIA COLI (A)  Final   Organism ID, Bacteria KLEBSIELLA PNEUMONIAE (A)  Final      Susceptibility   Escherichia coli - MIC*    AMPICILLIN 8 SENSITIVE Sensitive     CEFAZOLIN <=4 SENSITIVE Sensitive     CEFEPIME <=0.12 SENSITIVE Sensitive     CEFTRIAXONE <=0.25 SENSITIVE Sensitive     CIPROFLOXACIN <=0.25 SENSITIVE Sensitive     GENTAMICIN <=1 SENSITIVE Sensitive     IMIPENEM <=0.25 SENSITIVE Sensitive     NITROFURANTOIN <=16 SENSITIVE Sensitive     TRIMETH/SULFA <=20 SENSITIVE Sensitive     AMPICILLIN/SULBACTAM 4 SENSITIVE Sensitive     PIP/TAZO <=4 SENSITIVE Sensitive     * 60,000 COLONIES/mL ESCHERICHIA COLI   Klebsiella pneumoniae - MIC*    AMPICILLIN >=32 RESISTANT Resistant     CEFAZOLIN <=4 SENSITIVE Sensitive     CEFEPIME <=0.12 SENSITIVE Sensitive     CEFTRIAXONE <=0.25 SENSITIVE Sensitive     CIPROFLOXACIN <=0.25 SENSITIVE Sensitive     GENTAMICIN <=1 SENSITIVE Sensitive     IMIPENEM 0.5 SENSITIVE Sensitive     NITROFURANTOIN 64 INTERMEDIATE Intermediate     TRIMETH/SULFA <=20 SENSITIVE Sensitive     AMPICILLIN/SULBACTAM 8 SENSITIVE Sensitive     PIP/TAZO <=4  SENSITIVE Sensitive     *  20,000 COLONIES/mL KLEBSIELLA PNEUMONIAE  Resp Panel by RT-PCR (Flu A&B, Covid) Anterior Nasal Swab     Status: None   Collection Time: 04/12/22  5:06 PM   Specimen: Anterior Nasal Swab  Result Value Ref Range Status   SARS Coronavirus 2 by RT PCR NEGATIVE NEGATIVE Final    Comment: (NOTE) SARS-CoV-2 target nucleic acids are NOT DETECTED.  The SARS-CoV-2 RNA is generally detectable in upper respiratory specimens during the acute phase of infection. The lowest concentration of SARS-CoV-2 viral copies this assay can detect is 138 copies/mL. A negative result does not preclude SARS-Cov-2 infection and should not be used as the sole basis for treatment or other patient management decisions. A negative result may occur with  improper specimen collection/handling, submission of specimen other than nasopharyngeal swab, presence of viral mutation(s) within the areas targeted by this assay, and inadequate number of viral copies(<138 copies/mL). A negative result must be combined with clinical observations, patient history, and epidemiological information. The expected result is Negative.  Fact Sheet for Patients:  EntrepreneurPulse.com.au  Fact Sheet for Healthcare Providers:  IncredibleEmployment.be  This test is no t yet approved or cleared by the [place] FDA and  has been authorized for detection and/or diagnosis of SARS-CoV-2 by FDA under an Emergency Use Authorization (EUA). This EUA will remain  in effect (meaning this test can be used) for the duration of the COVID-19 declaration under Section 564(b)(1) of the Act, 21 U.S.C.section 360bbb-3(b)(1), unless the authorization is terminated  or revoked sooner.       Influenza A by PCR NEGATIVE NEGATIVE Final   Influenza B by PCR NEGATIVE NEGATIVE Final    Comment: (NOTE) The Xpert Xpress SARS-CoV-2/FLU/RSV plus assay is intended as an aid in the diagnosis of  influenza from Nasopharyngeal swab specimens and should not be used as a sole basis for treatment. Nasal washings and aspirates are unacceptable for Xpert Xpress SARS-CoV-2/FLU/RSV testing.  Fact Sheet for Patients: EntrepreneurPulse.com.au  Fact Sheet for Healthcare Providers: IncredibleEmployment.be  This test is not yet approved or cleared by the [place] FDA and has been authorized for detection and/or diagnosis of SARS-CoV-2 by FDA under an Emergency Use Authorization (EUA). This EUA will remain in effect (meaning this test can be used) for the duration of the COVID-19 declaration under Section 564(b)(1) of the Act, 21 U.S.C. section 360bbb-3(b)(1), unless the authorization is terminated or revoked.  Performed at [facility], [place] [address] [place], [place] [address]   Urine Culture     Status: Abnormal   Collection Time: 04/16/22  4:27 AM   Specimen: Urine, Clean Catch  Result Value Ref Range Status   Specimen Description URINE, CLEAN CATCH  Final   Special Requests   Final    NONE Performed at [facility], [place] [address] [place], [place] [address]    Culture MULTIPLE SPECIES PRESENT, SUGGEST RECOLLECTION (A)  Final   Report Status 04/17/2022 FINAL  Final    FURTHER DISCHARGE INSTRUCTIONS:  Get Medicines reviewed and adjusted: Please take all your medications with you for your next visit with your Primary MD  Laboratory/radiological data: Please request your Primary MD to go over all hospital tests and procedure/radiological results at the follow up, please ask your Primary MD to get all Hospital records sent to his/her office.  In some cases, they will be blood work, cultures and biopsy results pending at the time of your discharge. Please request that your primary care M.D. goes through all  the records of your hospital data and follows up on these results.  Also Note the following: If you  experience worsening of your admission symptoms, develop shortness of breath, life threatening emergency, suicidal or homicidal thoughts you must seek medical attention immediately by calling 911 or calling your MD immediately  if symptoms less severe.  You must read complete instructions/literature along with all the possible adverse reactions/side effects for all the Medicines you take and that have been prescribed to you. Take any new Medicines after you have completely understood and accpet all the possible adverse reactions/side effects.   Do not drive when taking Pain medications or sleeping medications (Benzodaizepines)  Do not take more than prescribed Pain, Sleep and Anxiety Medications. It is not advisable to combine anxiety,sleep and pain medications without talking with your primary care practitioner  Special Instructions: If you have smoked or chewed Tobacco  in the last 2 yrs please stop smoking, stop any regular Alcohol  and or any Recreational drug use.  Wear Seat belts while driving.  Please note: You were cared for by a hospitalist during your hospital stay. Once you are discharged, your primary care physician will handle any further medical issues. Please note that NO REFILLS for any discharge medications will be authorized once you are discharged, as it is imperative that you return to your primary care physician (or establish a relationship with a primary care physician if you do not have one) for your post hospital discharge needs so that they can reassess your need for medications and monitor your lab values.  Total Time spent coordinating discharge including counseling, education and face to face time equals greater than 30 minutes.  Signed: Atleigh Gruen 04/19/2022 3:24 PM

## 2022-04-19 NOTE — Progress Notes (Signed)
Brief note-  61 year old with history of chronic hypoxic/hypercarbic on 2 L of oxygen at home, OSA/OHS-who presented with confusion-she was found to have acute metabolic encephalopathy due to suspected hypercarbia.  She was admitted to the hospitalist service.  Acute encephalopathy: Thought to be due to hypercarbia in the setting of OSA/OHS-causing a restrictive thoracic disorder.  Tolerating CPAP very well-awaiting arrangements for NIV at home before consideration of discharge.  Full note to follow.

## 2022-04-19 NOTE — Progress Notes (Signed)
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  Assessment & Plan: Acute metabolic encephalopathy: This MD took care of this patient during her most recent hospitalization-she was significantly confused/paranoid then-her overall mental status is much better but she continues to have episodes of some confusion/paranoia.  Unclear whether this is a residue of her most recent encephalopathy and is a new baseline-plan is to continue with supportive care for now.  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  Remains stable on 2-3 L of oxygen.  Recent history of UTI: Recent urine culture showed few colonies of Klebsiella/E. coli UTI.  Currently off antibiotics.repeat urine culture sent here showed multiple species.  Denies any dysuria, no fever or leukocytosis.  Hypertension: Continue to monitor blood pressure.  Continue antihypertensives  Hyperlipidemia: Continue Lipitor  Chronic diastolic congestive heart failure: Recent echo showed EF of 60 to 65%, grade 1 diastolic dysfunction.  Currently euvolemic  History of left lower extremity DVT: Diagnosed on 03/05/2022.  Currently on Eliquis  Chronic iron-deficiency anemia: Hemoglobin stable  Morbid obesity: BMI 40.8  Debility/deconditioning: PT/OT consulted.  Recommended home with home health    DVT prophylaxis:SCDs Start: 04/15/22 2212 apixaban (ELIQUIS) tablet 5 mg     Code Status: Full Code  Family Communication: Discussed with husband at  bedside on 7/5  Patient status: Inpatient  Patient is from : Home  Anticipated discharge to: Home with home health  Estimated DC date: As soon as NIV is arranged   Consultants: None  Procedures: None  Antimicrobials:  Anti-infectives (From admission, onward)    None       Subjective: Asking when her CPAP will be arranged-husband at bedside-acknowledges significant improvement in mental status compared to most recent hospitalization.  Objective: Vitals:   04/19/22 0000 04/19/22 0500 04/19/22 0841 04/19/22 1151  BP: 131/86  (!) 157/90 (!) 144/78  Pulse: 92  79 68  Resp: 18  19 20   Temp: 98.2 F (36.8 C)  97.9 F (36.6 C) 97.7 F (36.5 C)  TempSrc: Oral  Oral Oral  SpO2: 92%  93%   Weight:  95.3 kg    Height:        Intake/Output Summary (Last 24 hours) at 04/19/2022 1354 Last data filed at 04/19/2022 1149 Gross per 24 hour  Intake 340 ml  Output 400 ml  Net -60 ml    Filed Weights   04/15/22 1255 04/19/22 0500  Weight: 98.1 kg 95.3 kg    Examination: Gen Exam:Alert awake-not in any distress HEENT:atraumatic, normocephalic Chest: B/L clear to auscultation anteriorly CVS:S1S2 regular Abdomen:soft non tender, non distended Extremities:no edema Neurology: Non focal Skin: no rash   Data Reviewed: I have personally reviewed following labs and imaging studies  CBC: Recent Labs  Lab 04/15/22 1455 04/15/22 1520 04/16/22 0558 04/16/22 1223 04/19/22 0144  WBC  --  4.3 4.4  --  6.3  NEUTROABS  --  2.5 2.9  --   --   HGB 12.9 11.6* 11.2* 13.3 11.4*  HCT 38.0 38.6 37.7 39.0 36.9  MCV  --  80.6 83.0  --  78.8*  PLT  --  309 237  --  291  Basic Metabolic Panel: Recent Labs  Lab 04/15/22 1455 04/15/22 1520 04/16/22 0558 04/16/22 1223 04/19/22 0144  NA 139 140 139 137 139  K 3.1* 3.3* 4.4 3.9 3.5  CL  --  100 100  --  98  CO2  --  30 32  --  32  GLUCOSE  --  97 83  --  108*  BUN  --  <5* <5*  --  7*  CREATININE  --  0.46 0.48  --  0.47   CALCIUM  --  9.2 9.0  --  9.3  MG  --  1.6* 1.8  --   --   PHOS  --   --  4.5  --   --       Recent Results (from the past 240 hour(s))  Culture, blood (Routine X 2) w Reflex to ID Panel     Status: None   Collection Time: 04/12/22  2:05 PM   Specimen: BLOOD  Result Value Ref Range Status   Specimen Description   Final    BLOOD LEFT ANTECUBITAL Performed at [facility], [address] [place], [place] [address]    Special Requests   Final    BOTTLES DRAWN AEROBIC AND ANAEROBIC Blood Culture results may not be optimal due to an inadequate volume of blood received in culture bottles Performed at [facility], [address], [place], [place] [address]    Culture   Final    NO GROWTH 5 DAYS Performed at [facility], [address], [place], [place] [address]    Report Status 04/17/2022 FINAL  Final  Culture, blood (Routine X 2) w Reflex to ID Panel     Status: None   Collection Time: 04/12/22  2:05 PM   Specimen: BLOOD  Result Value Ref Range Status   Specimen Description   Final    BLOOD BLOOD LEFT FOREARM Performed at [facility], [address] [place], [place] [address]    Special Requests   Final    BOTTLES DRAWN AEROBIC AND ANAEROBIC Blood Culture results may not be optimal due to an inadequate volume of blood received in culture bottles Performed at [facility], [address], [place], [place] [address]    Culture   Final    NO GROWTH 5 DAYS Performed at [facility], [address], [place], [place] [address]    Report Status 04/17/2022 FINAL  Final  Urine Culture     Status: Abnormal   Collection Time: 04/12/22  4:24 PM   Specimen: In/Out Cath Urine  Result Value Ref Range Status   Specimen Description   Final    IN/OUT CATH URINE Performed at [facility], [address] [place], [place] [address]    Special Requests   Final    NONE Performed  at [facility], [address], [place], [place] [address]    Culture (A)  Final    60,000 COLONIES/mL ESCHERICHIA COLI 20,000 COLONIES/mL KLEBSIELLA PNEUMONIAE    Report Status 04/15/2022 FINAL  Final   Organism ID, Bacteria ESCHERICHIA COLI (A)  Final   Organism ID, Bacteria KLEBSIELLA PNEUMONIAE (A)  Final      Susceptibility   Escherichia coli - MIC*    AMPICILLIN 8 SENSITIVE Sensitive     CEFAZOLIN <=4 SENSITIVE Sensitive     CEFEPIME <=0.12 SENSITIVE Sensitive     CEFTRIAXONE <=0.25 SENSITIVE Sensitive  CIPROFLOXACIN <=0.25 SENSITIVE Sensitive     GENTAMICIN <=1 SENSITIVE Sensitive     IMIPENEM <=0.25 SENSITIVE Sensitive     NITROFURANTOIN <=16 SENSITIVE Sensitive     TRIMETH/SULFA <=20 SENSITIVE Sensitive     AMPICILLIN/SULBACTAM 4 SENSITIVE Sensitive     PIP/TAZO <=4 SENSITIVE Sensitive     * 60,000 COLONIES/mL ESCHERICHIA COLI   Klebsiella pneumoniae - MIC*    AMPICILLIN >=32 RESISTANT Resistant     CEFAZOLIN <=4 SENSITIVE Sensitive     CEFEPIME <=0.12 SENSITIVE Sensitive     CEFTRIAXONE <=0.25 SENSITIVE Sensitive     CIPROFLOXACIN <=0.25 SENSITIVE Sensitive     GENTAMICIN <=1 SENSITIVE Sensitive     IMIPENEM 0.5 SENSITIVE Sensitive     NITROFURANTOIN 64 INTERMEDIATE Intermediate     TRIMETH/SULFA <=20 SENSITIVE Sensitive     AMPICILLIN/SULBACTAM 8 SENSITIVE Sensitive     PIP/TAZO <=4 SENSITIVE Sensitive     * 20,000 COLONIES/mL KLEBSIELLA PNEUMONIAE  Resp Panel by RT-PCR (Flu A&B, Covid) Anterior Nasal Swab     Status: None   Collection Time: 04/12/22  5:06 PM   Specimen: Anterior Nasal Swab  Result Value Ref Range Status   SARS Coronavirus 2 by RT PCR NEGATIVE NEGATIVE Final    Comment: (NOTE) SARS-CoV-2 target nucleic acids are NOT DETECTED.  The SARS-CoV-2 RNA is generally detectable in upper respiratory specimens during the acute phase of infection. The lowest concentration of SARS-CoV-2 viral copies this assay can detect is 138  copies/mL. A negative result does not preclude SARS-Cov-2 infection and should not be used as the sole basis for treatment or other patient management decisions. A negative result may occur with  improper specimen collection/handling, submission of specimen other than nasopharyngeal swab, presence of viral mutation(s) within the areas targeted by this assay, and inadequate number of viral copies(<138 copies/mL). A negative result must be combined with clinical observations, patient history, and epidemiological information. The expected result is Negative.  Fact Sheet for Patients:  BloggerCourse.com  Fact Sheet for Healthcare Providers:  SeriousBroker.it  This test is no t yet approved or cleared by the [place] FDA and  has been authorized for detection and/or diagnosis of SARS-CoV-2 by FDA under an Emergency Use Authorization (EUA). This EUA will remain  in effect (meaning this test can be used) for the duration of the COVID-19 declaration under Section 564(b)(1) of the Act, 21 U.S.C.section 360bbb-3(b)(1), unless the authorization is terminated  or revoked sooner.       Influenza A by PCR NEGATIVE NEGATIVE Final   Influenza B by PCR NEGATIVE NEGATIVE Final    Comment: (NOTE) The Xpert Xpress SARS-CoV-2/FLU/RSV plus assay is intended as an aid in the diagnosis of influenza from Nasopharyngeal swab specimens and should not be used as a sole basis for treatment. Nasal washings and aspirates are unacceptable for Xpert Xpress SARS-CoV-2/FLU/RSV testing.  Fact Sheet for Patients: BloggerCourse.com  Fact Sheet for Healthcare Providers: SeriousBroker.it  This test is not yet approved or cleared by the [place] FDA and has been authorized for detection and/or diagnosis of SARS-CoV-2 by FDA under an Emergency Use Authorization (EUA). This EUA will remain in effect (meaning  this test can be used) for the duration of the COVID-19 declaration under Section 564(b)(1) of the Act, 21 U.S.C. section 360bbb-3(b)(1), unless the authorization is terminated or revoked.  Performed at [facility], [address], [place], [place] [address]   Urine Culture     Status: Abnormal   Collection Time: 04/16/22  4:27 AM   Specimen: Urine, Clean Catch  Result Value Ref Range Status   Specimen Description URINE, CLEAN CATCH  Final   Special Requests   Final    NONE Performed at [facility], [address] [place], [place] [address]    Culture MULTIPLE SPECIES PRESENT, SUGGEST RECOLLECTION (A)  Final   Report Status 04/17/2022 FINAL  Final     Radiology Studies: No results found.  Scheduled Meds:  amLODipine  10 mg Oral Daily   apixaban  5 mg Oral BID   atorvastatin  40 mg Oral Daily   ferrous sulfate  325 mg Oral Q breakfast   irbesartan  75 mg Oral Daily   metoprolol succinate  25 mg Oral Daily   Continuous Infusions:   LOS: 4 days   Jeoffrey Massed, MD [facility] P7/02/2022, 1:54 PM

## 2022-04-21 DIAGNOSIS — K567 Ileus, unspecified: Secondary | ICD-10-CM | POA: Diagnosis not present

## 2022-04-21 DIAGNOSIS — J9602 Acute respiratory failure with hypercapnia: Secondary | ICD-10-CM | POA: Diagnosis not present

## 2022-04-21 DIAGNOSIS — I82409 Acute embolism and thrombosis of unspecified deep veins of unspecified lower extremity: Secondary | ICD-10-CM | POA: Diagnosis not present

## 2022-04-21 DIAGNOSIS — K219 Gastro-esophageal reflux disease without esophagitis: Secondary | ICD-10-CM | POA: Diagnosis not present

## 2022-04-21 DIAGNOSIS — D649 Anemia, unspecified: Secondary | ICD-10-CM | POA: Diagnosis not present

## 2022-04-21 DIAGNOSIS — I110 Hypertensive heart disease with heart failure: Secondary | ICD-10-CM | POA: Diagnosis not present

## 2022-04-21 DIAGNOSIS — J9601 Acute respiratory failure with hypoxia: Secondary | ICD-10-CM | POA: Diagnosis not present

## 2022-04-21 DIAGNOSIS — D751 Secondary polycythemia: Secondary | ICD-10-CM | POA: Diagnosis not present

## 2022-04-21 DIAGNOSIS — G9341 Metabolic encephalopathy: Secondary | ICD-10-CM | POA: Diagnosis not present

## 2022-04-21 DIAGNOSIS — I5033 Acute on chronic diastolic (congestive) heart failure: Secondary | ICD-10-CM | POA: Diagnosis not present

## 2022-04-21 DIAGNOSIS — E876 Hypokalemia: Secondary | ICD-10-CM | POA: Diagnosis not present

## 2022-04-21 DIAGNOSIS — G4733 Obstructive sleep apnea (adult) (pediatric): Secondary | ICD-10-CM | POA: Diagnosis not present

## 2022-04-23 DIAGNOSIS — D751 Secondary polycythemia: Secondary | ICD-10-CM | POA: Diagnosis not present

## 2022-04-23 DIAGNOSIS — I110 Hypertensive heart disease with heart failure: Secondary | ICD-10-CM | POA: Diagnosis not present

## 2022-04-23 DIAGNOSIS — E876 Hypokalemia: Secondary | ICD-10-CM | POA: Diagnosis not present

## 2022-04-23 DIAGNOSIS — J9601 Acute respiratory failure with hypoxia: Secondary | ICD-10-CM | POA: Diagnosis not present

## 2022-04-23 DIAGNOSIS — D649 Anemia, unspecified: Secondary | ICD-10-CM | POA: Diagnosis not present

## 2022-04-23 DIAGNOSIS — G4733 Obstructive sleep apnea (adult) (pediatric): Secondary | ICD-10-CM | POA: Diagnosis not present

## 2022-04-23 DIAGNOSIS — I82409 Acute embolism and thrombosis of unspecified deep veins of unspecified lower extremity: Secondary | ICD-10-CM | POA: Diagnosis not present

## 2022-04-23 DIAGNOSIS — K567 Ileus, unspecified: Secondary | ICD-10-CM | POA: Diagnosis not present

## 2022-04-23 DIAGNOSIS — G9341 Metabolic encephalopathy: Secondary | ICD-10-CM | POA: Diagnosis not present

## 2022-04-23 DIAGNOSIS — I5033 Acute on chronic diastolic (congestive) heart failure: Secondary | ICD-10-CM | POA: Diagnosis not present

## 2022-04-23 DIAGNOSIS — K219 Gastro-esophageal reflux disease without esophagitis: Secondary | ICD-10-CM | POA: Diagnosis not present

## 2022-04-23 DIAGNOSIS — J9602 Acute respiratory failure with hypercapnia: Secondary | ICD-10-CM | POA: Diagnosis not present

## 2022-04-25 DIAGNOSIS — K219 Gastro-esophageal reflux disease without esophagitis: Secondary | ICD-10-CM | POA: Diagnosis not present

## 2022-04-25 DIAGNOSIS — I5033 Acute on chronic diastolic (congestive) heart failure: Secondary | ICD-10-CM | POA: Diagnosis not present

## 2022-04-25 DIAGNOSIS — K567 Ileus, unspecified: Secondary | ICD-10-CM | POA: Diagnosis not present

## 2022-04-25 DIAGNOSIS — J9602 Acute respiratory failure with hypercapnia: Secondary | ICD-10-CM | POA: Diagnosis not present

## 2022-04-25 DIAGNOSIS — D649 Anemia, unspecified: Secondary | ICD-10-CM | POA: Diagnosis not present

## 2022-04-25 DIAGNOSIS — G4733 Obstructive sleep apnea (adult) (pediatric): Secondary | ICD-10-CM | POA: Diagnosis not present

## 2022-04-25 DIAGNOSIS — G9341 Metabolic encephalopathy: Secondary | ICD-10-CM | POA: Diagnosis not present

## 2022-04-25 DIAGNOSIS — I110 Hypertensive heart disease with heart failure: Secondary | ICD-10-CM | POA: Diagnosis not present

## 2022-04-25 DIAGNOSIS — I82409 Acute embolism and thrombosis of unspecified deep veins of unspecified lower extremity: Secondary | ICD-10-CM | POA: Diagnosis not present

## 2022-04-25 DIAGNOSIS — J9601 Acute respiratory failure with hypoxia: Secondary | ICD-10-CM | POA: Diagnosis not present

## 2022-04-25 DIAGNOSIS — D751 Secondary polycythemia: Secondary | ICD-10-CM | POA: Diagnosis not present

## 2022-04-25 DIAGNOSIS — E876 Hypokalemia: Secondary | ICD-10-CM | POA: Diagnosis not present

## 2022-04-26 DIAGNOSIS — I110 Hypertensive heart disease with heart failure: Secondary | ICD-10-CM | POA: Diagnosis not present

## 2022-04-26 DIAGNOSIS — K219 Gastro-esophageal reflux disease without esophagitis: Secondary | ICD-10-CM | POA: Diagnosis not present

## 2022-04-26 DIAGNOSIS — D649 Anemia, unspecified: Secondary | ICD-10-CM | POA: Diagnosis not present

## 2022-04-26 DIAGNOSIS — J9601 Acute respiratory failure with hypoxia: Secondary | ICD-10-CM | POA: Diagnosis not present

## 2022-04-26 DIAGNOSIS — I5033 Acute on chronic diastolic (congestive) heart failure: Secondary | ICD-10-CM | POA: Diagnosis not present

## 2022-04-26 DIAGNOSIS — I82409 Acute embolism and thrombosis of unspecified deep veins of unspecified lower extremity: Secondary | ICD-10-CM | POA: Diagnosis not present

## 2022-04-26 DIAGNOSIS — K567 Ileus, unspecified: Secondary | ICD-10-CM | POA: Diagnosis not present

## 2022-04-26 DIAGNOSIS — G4733 Obstructive sleep apnea (adult) (pediatric): Secondary | ICD-10-CM | POA: Diagnosis not present

## 2022-04-26 DIAGNOSIS — G9341 Metabolic encephalopathy: Secondary | ICD-10-CM | POA: Diagnosis not present

## 2022-04-26 DIAGNOSIS — E876 Hypokalemia: Secondary | ICD-10-CM | POA: Diagnosis not present

## 2022-04-26 DIAGNOSIS — D751 Secondary polycythemia: Secondary | ICD-10-CM | POA: Diagnosis not present

## 2022-04-26 DIAGNOSIS — J9602 Acute respiratory failure with hypercapnia: Secondary | ICD-10-CM | POA: Diagnosis not present

## 2022-04-27 DIAGNOSIS — I82409 Acute embolism and thrombosis of unspecified deep veins of unspecified lower extremity: Secondary | ICD-10-CM | POA: Diagnosis not present

## 2022-04-27 DIAGNOSIS — G9340 Encephalopathy, unspecified: Secondary | ICD-10-CM | POA: Diagnosis not present

## 2022-04-27 DIAGNOSIS — I10 Essential (primary) hypertension: Secondary | ICD-10-CM | POA: Diagnosis not present

## 2022-04-27 DIAGNOSIS — R001 Bradycardia, unspecified: Secondary | ICD-10-CM | POA: Diagnosis not present

## 2022-04-28 DIAGNOSIS — I110 Hypertensive heart disease with heart failure: Secondary | ICD-10-CM | POA: Diagnosis not present

## 2022-04-28 DIAGNOSIS — I5033 Acute on chronic diastolic (congestive) heart failure: Secondary | ICD-10-CM | POA: Diagnosis not present

## 2022-04-28 DIAGNOSIS — D751 Secondary polycythemia: Secondary | ICD-10-CM | POA: Diagnosis not present

## 2022-04-28 DIAGNOSIS — G9341 Metabolic encephalopathy: Secondary | ICD-10-CM | POA: Diagnosis not present

## 2022-04-28 DIAGNOSIS — J9601 Acute respiratory failure with hypoxia: Secondary | ICD-10-CM | POA: Diagnosis not present

## 2022-04-28 DIAGNOSIS — I82409 Acute embolism and thrombosis of unspecified deep veins of unspecified lower extremity: Secondary | ICD-10-CM | POA: Diagnosis not present

## 2022-04-28 DIAGNOSIS — J9602 Acute respiratory failure with hypercapnia: Secondary | ICD-10-CM | POA: Diagnosis not present

## 2022-04-28 DIAGNOSIS — K219 Gastro-esophageal reflux disease without esophagitis: Secondary | ICD-10-CM | POA: Diagnosis not present

## 2022-04-28 DIAGNOSIS — G4733 Obstructive sleep apnea (adult) (pediatric): Secondary | ICD-10-CM | POA: Diagnosis not present

## 2022-04-28 DIAGNOSIS — D649 Anemia, unspecified: Secondary | ICD-10-CM | POA: Diagnosis not present

## 2022-04-28 DIAGNOSIS — E876 Hypokalemia: Secondary | ICD-10-CM | POA: Diagnosis not present

## 2022-04-28 DIAGNOSIS — K567 Ileus, unspecified: Secondary | ICD-10-CM | POA: Diagnosis not present

## 2022-05-01 DIAGNOSIS — I5033 Acute on chronic diastolic (congestive) heart failure: Secondary | ICD-10-CM | POA: Diagnosis not present

## 2022-05-01 DIAGNOSIS — D649 Anemia, unspecified: Secondary | ICD-10-CM | POA: Diagnosis not present

## 2022-05-01 DIAGNOSIS — K567 Ileus, unspecified: Secondary | ICD-10-CM | POA: Diagnosis not present

## 2022-05-01 DIAGNOSIS — G9341 Metabolic encephalopathy: Secondary | ICD-10-CM | POA: Diagnosis not present

## 2022-05-01 DIAGNOSIS — J9601 Acute respiratory failure with hypoxia: Secondary | ICD-10-CM | POA: Diagnosis not present

## 2022-05-01 DIAGNOSIS — E876 Hypokalemia: Secondary | ICD-10-CM | POA: Diagnosis not present

## 2022-05-01 DIAGNOSIS — K219 Gastro-esophageal reflux disease without esophagitis: Secondary | ICD-10-CM | POA: Diagnosis not present

## 2022-05-01 DIAGNOSIS — J9602 Acute respiratory failure with hypercapnia: Secondary | ICD-10-CM | POA: Diagnosis not present

## 2022-05-01 DIAGNOSIS — D751 Secondary polycythemia: Secondary | ICD-10-CM | POA: Diagnosis not present

## 2022-05-01 DIAGNOSIS — I82409 Acute embolism and thrombosis of unspecified deep veins of unspecified lower extremity: Secondary | ICD-10-CM | POA: Diagnosis not present

## 2022-05-01 DIAGNOSIS — I110 Hypertensive heart disease with heart failure: Secondary | ICD-10-CM | POA: Diagnosis not present

## 2022-05-01 DIAGNOSIS — G4733 Obstructive sleep apnea (adult) (pediatric): Secondary | ICD-10-CM | POA: Diagnosis not present

## 2022-05-03 DIAGNOSIS — K219 Gastro-esophageal reflux disease without esophagitis: Secondary | ICD-10-CM | POA: Diagnosis not present

## 2022-05-03 DIAGNOSIS — I110 Hypertensive heart disease with heart failure: Secondary | ICD-10-CM | POA: Diagnosis not present

## 2022-05-03 DIAGNOSIS — I82409 Acute embolism and thrombosis of unspecified deep veins of unspecified lower extremity: Secondary | ICD-10-CM | POA: Diagnosis not present

## 2022-05-03 DIAGNOSIS — I5033 Acute on chronic diastolic (congestive) heart failure: Secondary | ICD-10-CM | POA: Diagnosis not present

## 2022-05-03 DIAGNOSIS — K567 Ileus, unspecified: Secondary | ICD-10-CM | POA: Diagnosis not present

## 2022-05-03 DIAGNOSIS — J9602 Acute respiratory failure with hypercapnia: Secondary | ICD-10-CM | POA: Diagnosis not present

## 2022-05-03 DIAGNOSIS — D751 Secondary polycythemia: Secondary | ICD-10-CM | POA: Diagnosis not present

## 2022-05-03 DIAGNOSIS — G9341 Metabolic encephalopathy: Secondary | ICD-10-CM | POA: Diagnosis not present

## 2022-05-03 DIAGNOSIS — G4733 Obstructive sleep apnea (adult) (pediatric): Secondary | ICD-10-CM | POA: Diagnosis not present

## 2022-05-03 DIAGNOSIS — J9601 Acute respiratory failure with hypoxia: Secondary | ICD-10-CM | POA: Diagnosis not present

## 2022-05-03 DIAGNOSIS — E876 Hypokalemia: Secondary | ICD-10-CM | POA: Diagnosis not present

## 2022-05-03 DIAGNOSIS — D649 Anemia, unspecified: Secondary | ICD-10-CM | POA: Diagnosis not present

## 2022-05-04 ENCOUNTER — Ambulatory Visit: Payer: 59 | Admitting: Podiatry

## 2022-05-04 ENCOUNTER — Encounter: Payer: Self-pay | Admitting: Podiatry

## 2022-05-04 DIAGNOSIS — I82409 Acute embolism and thrombosis of unspecified deep veins of unspecified lower extremity: Secondary | ICD-10-CM | POA: Diagnosis not present

## 2022-05-04 DIAGNOSIS — I5033 Acute on chronic diastolic (congestive) heart failure: Secondary | ICD-10-CM | POA: Diagnosis not present

## 2022-05-04 DIAGNOSIS — R609 Edema, unspecified: Secondary | ICD-10-CM | POA: Diagnosis not present

## 2022-05-04 NOTE — Progress Notes (Signed)
  Subjective:  Patient ID: Tamara Russell, female    DOB: 07-20-1961,  MRN: [id number]  Chief Complaint  Patient presents with   Foot Problem     right leg swelling off and on/ hx of right leg clot    61 y.o. female presents with the above complaint. History confirmed with patient.  Her husband provides much of the history as she has some level of confusion.  She was diagnosed with a superficial blood clot in the left leg in the spring she has had swelling of the right leg as well.  Had a recent ultrasound a few weeks ago which did not show any blood clot, they would like to know why it continues to swell  Objective:  Physical Exam: warm, good capillary refill, no trophic changes or ulcerative lesions, normal DP and PT pulses, normal sensory exam, and bilaterally she does have peripheral edema that is worse on the right side +2 pitting, there is no pain with palpation of the posterior calf negative Homans' sign, no pain in the foot or ankle with full smooth range of motion of her joints.  Assessment:   1. Edema, peripheral      Plan:  Patient was evaluated and treated and all questions answered.  I discussed with him the multiple etiologies and treatment options for peripheral lower extremity dependent edema including elevation of the legs and compression stockings.  There is no clinical evidence of a DVT in the leg today and she had a bilateral venous ultrasound 3 weeks ago that did not show any clot either.  There is some level of venous insufficiency and I placed a [facility] to [facility] for this.  I will see him back as needed if other issues develop  Return if symptoms worsen or fail to improve.

## 2022-05-05 DIAGNOSIS — R001 Bradycardia, unspecified: Secondary | ICD-10-CM | POA: Diagnosis not present

## 2022-05-09 DIAGNOSIS — G9341 Metabolic encephalopathy: Secondary | ICD-10-CM | POA: Diagnosis not present

## 2022-05-09 DIAGNOSIS — R4182 Altered mental status, unspecified: Secondary | ICD-10-CM | POA: Diagnosis not present

## 2022-05-09 DIAGNOSIS — R778 Other specified abnormalities of plasma proteins: Secondary | ICD-10-CM | POA: Diagnosis not present

## 2022-05-09 DIAGNOSIS — K567 Ileus, unspecified: Secondary | ICD-10-CM | POA: Diagnosis not present

## 2022-05-09 DIAGNOSIS — J9602 Acute respiratory failure with hypercapnia: Secondary | ICD-10-CM | POA: Diagnosis not present

## 2022-05-09 DIAGNOSIS — G4733 Obstructive sleep apnea (adult) (pediatric): Secondary | ICD-10-CM | POA: Diagnosis not present

## 2022-05-09 DIAGNOSIS — I10 Essential (primary) hypertension: Secondary | ICD-10-CM | POA: Diagnosis not present

## 2022-05-09 DIAGNOSIS — K922 Gastrointestinal hemorrhage, unspecified: Secondary | ICD-10-CM | POA: Diagnosis not present

## 2022-05-09 DIAGNOSIS — D649 Anemia, unspecified: Secondary | ICD-10-CM | POA: Diagnosis not present

## 2022-05-09 DIAGNOSIS — I5033 Acute on chronic diastolic (congestive) heart failure: Secondary | ICD-10-CM | POA: Diagnosis not present

## 2022-05-09 DIAGNOSIS — J9601 Acute respiratory failure with hypoxia: Secondary | ICD-10-CM | POA: Diagnosis not present

## 2022-05-09 DIAGNOSIS — E876 Hypokalemia: Secondary | ICD-10-CM | POA: Diagnosis not present

## 2022-05-09 DIAGNOSIS — K219 Gastro-esophageal reflux disease without esophagitis: Secondary | ICD-10-CM | POA: Diagnosis not present

## 2022-05-09 DIAGNOSIS — I82409 Acute embolism and thrombosis of unspecified deep veins of unspecified lower extremity: Secondary | ICD-10-CM | POA: Diagnosis not present

## 2022-05-09 DIAGNOSIS — I110 Hypertensive heart disease with heart failure: Secondary | ICD-10-CM | POA: Diagnosis not present

## 2022-05-09 DIAGNOSIS — D751 Secondary polycythemia: Secondary | ICD-10-CM | POA: Diagnosis not present

## 2022-05-11 DIAGNOSIS — G9341 Metabolic encephalopathy: Secondary | ICD-10-CM | POA: Diagnosis not present

## 2022-05-11 DIAGNOSIS — E876 Hypokalemia: Secondary | ICD-10-CM | POA: Diagnosis not present

## 2022-05-11 DIAGNOSIS — D751 Secondary polycythemia: Secondary | ICD-10-CM | POA: Diagnosis not present

## 2022-05-11 DIAGNOSIS — I5033 Acute on chronic diastolic (congestive) heart failure: Secondary | ICD-10-CM | POA: Diagnosis not present

## 2022-05-11 DIAGNOSIS — I110 Hypertensive heart disease with heart failure: Secondary | ICD-10-CM | POA: Diagnosis not present

## 2022-05-11 DIAGNOSIS — K567 Ileus, unspecified: Secondary | ICD-10-CM | POA: Diagnosis not present

## 2022-05-11 DIAGNOSIS — K219 Gastro-esophageal reflux disease without esophagitis: Secondary | ICD-10-CM | POA: Diagnosis not present

## 2022-05-11 DIAGNOSIS — I82409 Acute embolism and thrombosis of unspecified deep veins of unspecified lower extremity: Secondary | ICD-10-CM | POA: Diagnosis not present

## 2022-05-11 DIAGNOSIS — J9602 Acute respiratory failure with hypercapnia: Secondary | ICD-10-CM | POA: Diagnosis not present

## 2022-05-11 DIAGNOSIS — D649 Anemia, unspecified: Secondary | ICD-10-CM | POA: Diagnosis not present

## 2022-05-11 DIAGNOSIS — G4733 Obstructive sleep apnea (adult) (pediatric): Secondary | ICD-10-CM | POA: Diagnosis not present

## 2022-05-11 DIAGNOSIS — J9601 Acute respiratory failure with hypoxia: Secondary | ICD-10-CM | POA: Diagnosis not present

## 2022-05-12 DIAGNOSIS — K567 Ileus, unspecified: Secondary | ICD-10-CM | POA: Diagnosis not present

## 2022-05-12 DIAGNOSIS — D649 Anemia, unspecified: Secondary | ICD-10-CM | POA: Diagnosis not present

## 2022-05-12 DIAGNOSIS — I5033 Acute on chronic diastolic (congestive) heart failure: Secondary | ICD-10-CM | POA: Diagnosis not present

## 2022-05-12 DIAGNOSIS — I110 Hypertensive heart disease with heart failure: Secondary | ICD-10-CM | POA: Diagnosis not present

## 2022-05-12 DIAGNOSIS — I82409 Acute embolism and thrombosis of unspecified deep veins of unspecified lower extremity: Secondary | ICD-10-CM | POA: Diagnosis not present

## 2022-05-12 DIAGNOSIS — D751 Secondary polycythemia: Secondary | ICD-10-CM | POA: Diagnosis not present

## 2022-05-12 DIAGNOSIS — G9341 Metabolic encephalopathy: Secondary | ICD-10-CM | POA: Diagnosis not present

## 2022-05-12 DIAGNOSIS — J9602 Acute respiratory failure with hypercapnia: Secondary | ICD-10-CM | POA: Diagnosis not present

## 2022-05-12 DIAGNOSIS — E876 Hypokalemia: Secondary | ICD-10-CM | POA: Diagnosis not present

## 2022-05-12 DIAGNOSIS — G4733 Obstructive sleep apnea (adult) (pediatric): Secondary | ICD-10-CM | POA: Diagnosis not present

## 2022-05-12 DIAGNOSIS — J9601 Acute respiratory failure with hypoxia: Secondary | ICD-10-CM | POA: Diagnosis not present

## 2022-05-12 DIAGNOSIS — K219 Gastro-esophageal reflux disease without esophagitis: Secondary | ICD-10-CM | POA: Diagnosis not present

## 2022-05-15 ENCOUNTER — Encounter: Payer: Self-pay | Admitting: Neurology

## 2022-05-15 ENCOUNTER — Ambulatory Visit (INDEPENDENT_AMBULATORY_CARE_PROVIDER_SITE_OTHER): Payer: 59 | Admitting: Neurology

## 2022-05-15 VITALS — BP 151/88 | HR 84 | Wt 192.0 lb

## 2022-05-15 DIAGNOSIS — G9340 Encephalopathy, unspecified: Secondary | ICD-10-CM | POA: Diagnosis not present

## 2022-05-15 DIAGNOSIS — G7281 Critical illness myopathy: Secondary | ICD-10-CM | POA: Diagnosis not present

## 2022-05-15 DIAGNOSIS — R4586 Emotional lability: Secondary | ICD-10-CM | POA: Diagnosis not present

## 2022-05-15 DIAGNOSIS — R69 Illness, unspecified: Secondary | ICD-10-CM | POA: Diagnosis not present

## 2022-05-15 NOTE — Patient Instructions (Signed)
Continue current medications  Continue with physical therapy. If she has completed her current physical therapy, please contact me to send an extension  Follow up with Dr. Parke Simmers  Return in 3 months or sooner if worse

## 2022-05-15 NOTE — Progress Notes (Unsigned)
[facility]  PATIENT: Tamara Russell DOB: 22-Sep-1961  REQUESTING CLINICIAN: Ghimire, Henreitta Leber, MD HISTORY FROM: Patient and husband  REASON FOR VISIT: Confusion, Depression, Encephalopathy   HISTORICAL  CHIEF COMPLAINT:  Chief Complaint  Patient presents with   New Patient (Initial Visit)    Rm 12. Accompanied by husband, Tamara Russell. NP internal referral for acute encephalopathy.    HISTORY OF PRESENT ILLNESS:  This is a 61 year old woman past medical history of obesity, GERD, hypertension, who is presenting after a long hospitalization for encephalopathy.  While in the hospital patient had extensive work-up including MRI, LP, EEGs, neurology consulted and she even had treatment for IVIG for presumed encephalitis.  She continued to have confusion, but improved she was discharge to rehab first and now home.  Currently patient lives at home with husband and kids.  Husband reports that most of the time she dwells about the past and gets very emotional.  She is awake she is alert she is able to respond and communicate properly but again the majority of the time is spent remembering about the past, talking about her deceased grand mother and being very emotional.  Since leaving the hospital they have not set up care with psychiatry.  Denies any seizures, denies any previous history of psychiatric breakdown    Hospital Course (initial admission): Patient is a 61 y.o.  female morbid obesity, HTN, GERD-who was just discharged from this facility on 4/23-presented to the hospital on 4/24 with altered mental status.  Found to have metabolic encephalopathy.   Significant events: 4/19-4/23>> hospitalization for chest pain, confusion - UTI. 4/24 Presented to the ED for confusion-admit to [facility]. 4/28 Neuro eval - marital stress possibly causing AMS - psych consult recommended 5/01 Episode of aspiration - bradycardia/worsening hypoxemia - improved after suctioning.CXR with  bibasilar opacities-IV Ancef started. 5/04 Neuro re-eval per psych rec's-?autoimmune process causing AMS 5/05 Trial of Depakote per neurology. 5/06 Significant sedation / lethargy following Haldol/Ativan -concern for hypercarbia -BiPAP started. LP postponed. Haldol/Ativan dosage adjusted by psychiatry. 5/07 Intubated, eeg neg neuro consulted.  5/08 LP completed. Cortrak placed. 5/09 Extubated, continues to have agitated delirium with psychosis, and then episodes of minimal responsiveness 5/12 Intubated in setting SBO and suspected aspiration 5/15 Extubated, respiratory status improving and mental status clearing   05/16 transferred to [facility].    5/19 Completed antibiotic course the day before.  Started on IVIG. 5/23, 5 days IVIG completed.  5/24 was started on CPAP after first 3 nights of poor compliance, she has slowly started improving, now compliant,  still some waxing and waning mental status but overall better   06/01 patient medically stable, pending transfer to SNF to continue physical therapy.    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   Acute metabolic encephalopathy: Suspect multifactorial etiology-some residual delirium/encephalopathy from her most recent hospitalization (treated empirically with IVIG for autoimmune encephalitis)-and possible hypercarbia (discharged from SNF without CPAP).  This  MD took care of this patient during her most recent hospitalization-she was significantly confused/paranoid then-her overall mental status is much better but she continues to have episodes of some confusion/paranoia.  Patient to continue outpatient follow-up with neurology/psychiatry and her  primary care practitioner.   OTHER MEDICAL CONDITIONS: Hypertension, hyperlipidemia, CAD,    REVIEW OF SYSTEMS: Full 14 system  review of systems performed and negative with exception of: As noted in the HPI   ALLERGIES: Allergies  Allergen Reactions   Penicillins Other (See Comments)    Childhood allergy. Pt does not remember reaction Tolerated cefazolin 5/1-5/6, cefepime 5/12-5/14    HOME MEDICATIONS: Outpatient Medications Prior to Visit  Medication Sig Dispense Refill   acetaminophen (TYLENOL) 500 MG tablet Take 1 tablet (500 mg total) by mouth every 6 (six) hours as needed for moderate pain. Reported on 04/21/2016 (Patient taking differently: Take 1,000 mg by mouth every 6 (six) hours as needed for headache (pain).) 30 tablet 0   apixaban (ELIQUIS) 5 MG TABS tablet Take 1 tablet (5 mg total) by mouth 2 (two) times daily. (Patient taking differently: Take 5 mg by mouth at bedtime.) 60 tablet 0   atorvastatin (LIPITOR) 40 MG tablet Take 1 tablet (40 mg total) by mouth daily. (Patient taking differently: Take 40 mg by mouth at bedtime.) 30 tablet 0   dapagliflozin propanediol (FARXIGA) 10 MG TABS tablet Take 1 tablet (10 mg total) by mouth daily. 30 tablet 0   diclofenac Sodium (VOLTAREN) 1 % GEL Apply 2 g topically 3 (three) times daily. To bilateral knees as needed. (Patient taking differently: Apply 2 g topically daily as needed (knee pain).) 50 g 0   diphenhydramine-acetaminophen (TYLENOL PM) 25-500 MG TABS tablet Take 1 tablet by mouth at bedtime as needed (pain/sleep).     ferrous sulfate 324 MG TBEC Take 324 mg by mouth every morning.     hydrocortisone-pramoxine (ANALPRAM-HC) 2.5-1 % rectal cream Place 1 Application rectally daily as needed for hemorrhoids or anal itching.     irbesartan (AVAPRO) 75 MG tablet Take 0.5 tablets (37.5 mg total) by mouth daily. (Patient taking differently: Take 75 mg by mouth every morning.) 15 tablet 0   Melatonin 10 MG TABS Take 10 mg by mouth at  bedtime.     metoprolol succinate (TOPROL-XL) 25 MG 24 hr tablet Take 1 tablet (25 mg total) by mouth daily. 30 tablet 0   Multiple Vitamins-Minerals (CENTRUM SILVER ADULT 50+ PO) Take 1 tablet by mouth daily.     potassium chloride 20 MEQ TBCR Take 20 mEq by mouth daily. (Patient taking differently: Take 20 mEq by mouth at bedtime.) 30 tablet 3   spironolactone (ALDACTONE) 25 MG tablet Take 25 mg by mouth every morning.     No facility-administered medications prior to visit.    PAST MEDICAL HISTORY: Past Medical History:  Diagnosis Date   Acid reflux    Chronic diastolic heart failure (HCC)    Chronic respiratory failure with hypoxia and hypercapnia (HCC)    on 2 L [place] continuous   Complication of anesthesia    slow to wake up   DVT (deep venous thrombosis) (HCC)    of LLE ; dx'ed on 03/05/22   History of COVID-19    HLD (hyperlipidemia)    Hypertension     PAST SURGICAL HISTORY: Past Surgical History:  Procedure Laterality Date   ABDOMINAL HYSTERECTOMY     BIOPSY  11/17/2018   Procedure: BIOPSY;  Surgeon: Jerene Bears, MD;  Location: [facility];  Service: Gastroenterology;;   BREAST EXCISIONAL BIOPSY Left    benign   BREAST EXCISIONAL BIOPSY Right    benign   ESOPHAGOGASTRODUODENOSCOPY (EGD) WITH PROPOFOL N/A 11/17/2018   Procedure: ESOPHAGOGASTRODUODENOSCOPY (EGD) WITH PROPOFOL;  Surgeon: Jerene Bears, MD;  Location: [facility];  Service: Gastroenterology;  Laterality: N/A;    FAMILY HISTORY: Family  History  Problem Relation Age of Onset   Cancer Father 64       stomach   Hypertension Father    Diabetes Maternal Grandmother    Colon cancer Maternal Grandfather    Diabetes Paternal Grandmother    Colon cancer Paternal Grandmother     SOCIAL HISTORY: Social History   Socioeconomic History   Marital status: Married    Spouse name: Not on file   Number of children: Not on file   Years of education: Not on file   Highest education level: Not on file   Occupational History   Occupation: unemployed  Tobacco Use   Smoking status: Former    Packs/day: 0.50    Years: 20.00    Total pack years: 10.00    Types: Cigarettes    Start date: 04/30/1975    Quit date: 04/30/1995    Years since quitting: 27.0   Smokeless tobacco: Never  Vaping Use   Vaping Use: Never used  Substance and Sexual Activity   Alcohol use: No    Alcohol/week: 0.0 standard drinks of alcohol   Drug use: No   Sexual activity: Not on file  Other Topics Concern   Not on file  Social History Narrative   Not on file   Social Determinants of Health   Financial Resource Strain: Not on file  Food Insecurity: Not on file  Transportation Needs: Not on file  Physical Activity: Not on file  Stress: Not on file  Social Connections: Not on file  Intimate Partner Violence: Not on file    PHYSICAL EXAM  GENERAL EXAM/CONSTITUTIONAL: Vitals:  Vitals:   05/15/22 1520 05/15/22 1524  BP: (!) 175/93 (!) 151/88  Pulse: 84 84  Weight: 192 lb (87.1 kg)    Body mass index is 36.28 kg/m. Wt Readings from Last 3 Encounters:  05/15/22 192 lb (87.1 kg)  04/19/22 210 lb 1.6 oz (95.3 kg)  04/14/22 216 lb 7.8 oz (98.2 kg)   Patient is in no distress; well developed, nourished and groomed; neck is supple. Talking about the past, her childhood, her grandmother and all the children she raised.   EYES: Pupils round and reactive to light, Visual fields full to confrontation, Extraocular movements intacts,   MUSCULOSKELETAL: Gait, strength, tone, movements noted in Neurologic exam below  NEUROLOGIC: MENTAL STATUS:      No data to display         awake, alert, oriented to person, place and time recent and remote memory intact normal attention and concentration language fluent, comprehension intact, naming intact fund of knowledge appropriate  CRANIAL NERVE:  2nd, 3rd, 4th, 6th - pupils equal and reactive to light, visual fields full to confrontation, extraocular  muscles intact, no nystagmus 5th - facial sensation symmetric 7th - facial strength symmetric 8th - hearing intact 9th - palate elevates symmetrically, uvula midline 11th - shoulder shrug symmetric 12th - tongue protrusion midline  MOTOR:  normal bulk and tone, full strength in the BUE, BLE  SENSORY:  normal and symmetric to light touch  COORDINATION:  finger-nose-finger, fine finger movements normal  REFLEXES:  deep tendon reflexes present and symmetric  GAIT/STATION:  Deferred, able to stand with assistance    DIAGNOSTIC DATA (LABS, IMAGING, TESTING) - I reviewed patient records, labs, notes, testing and imaging myself where available.  Lab Results  Component Value Date   WBC 6.3 04/19/2022   HGB 11.4 (L) 04/19/2022   HCT 36.9 04/19/2022   MCV 78.8 (L) 04/19/2022  PLT 291 04/19/2022      Component Value Date/Time   NA 139 04/19/2022 0144   NA 140 03/05/2017 1449   K 3.5 04/19/2022 0144   CL 98 04/19/2022 0144   CO2 32 04/19/2022 0144   GLUCOSE 108 (H) 04/19/2022 0144   BUN 7 (L) 04/19/2022 0144   BUN 7 03/05/2017 1449   CREATININE 0.47 04/19/2022 0144   CREATININE 0.57 04/21/2016 1528   CALCIUM 9.3 04/19/2022 0144   PROT 5.7 (L) 04/19/2022 0144   ALBUMIN 2.5 (L) 04/19/2022 0144   AST 17 04/19/2022 0144   ALT 13 04/19/2022 0144   ALKPHOS 54 04/19/2022 0144   BILITOT 0.2 (L) 04/19/2022 0144   GFRNONAA >60 04/19/2022 0144   GFRNONAA >89 04/01/2015 1608   GFRAA >60 12/03/2019 2032   GFRAA >89 04/01/2015 1608   Lab Results  Component Value Date   CHOL 163 02/02/2022   HDL 78 02/02/2022   LDLCALC 80 02/02/2022   TRIG 84 02/25/2022   CHOLHDL 2.1 02/02/2022   Lab Results  Component Value Date   HGBA1C 4.7 (L) 04/16/2022   Lab Results  Component Value Date   VITAMINB12 815 04/16/2022   Lab Results  Component Value Date   TSH 1.798 04/15/2022    Head CT 04/12/22 No acute abnormality. Mild white matter changes consistent with chronic  microvascular ischemia.  EEG 03/03/22 This study is suggestive of moderate diffuse encephalopathy, nonspecific etiology. No seizures or epileptiform discharges were seen throughout the recording.   ASSESSMENT AND PLAN  61 y.o. year old female with  obesity, GERD, hypertension who is presenting to the clinic after a long hospitalization for encephalopathy. So far workup have been negative for the causes of the encephalopathy. Currently she is awake and alert but dwell about the past, and she is very emotional but so far, no neurological causes of her encephalopathy found. She does have sleep apnea and uses a CPAP, advise patient to continue it.  I have also recommended they consider referral thru their PMD to psychiatry. Continue with PT and return in 3 months or sooner if worse.    1. Encephalopathy   2. Emotional lability   3. Critical illness myopathy      Patient Instructions  Continue current medications  Continue with physical therapy. If she has completed her current physical therapy, please contact me to send an extension  Follow up with Dr. Parke Simmers  Consider referral to psychiatry  Follow up with podiatry regarding nail fungus and swelling Return in 3 months or sooner if worse   No orders of the defined types were placed in this encounter.   No orders of the defined types were placed in this encounter.   Return in about 3 months (around 08/15/2022).  I have spent a total of 65 minutes dedicated to this patient today, preparing to see patient, performing a medically appropriate examination and evaluation, ordering tests and/or medications and procedures, and counseling and educating the patient/family/caregiver; independently interpreting result and communicating results to the family/patient/caregiver; and documenting clinical information in the electronic medical record.   Windell Norfolk, MD 05/17/2022, 5:21 PM  [facility] [address] [place], [place] [address] [phone]

## 2022-05-16 ENCOUNTER — Telehealth: Payer: Self-pay | Admitting: Neurology

## 2022-05-16 DIAGNOSIS — I110 Hypertensive heart disease with heart failure: Secondary | ICD-10-CM | POA: Diagnosis not present

## 2022-05-16 DIAGNOSIS — D649 Anemia, unspecified: Secondary | ICD-10-CM | POA: Diagnosis not present

## 2022-05-16 DIAGNOSIS — K567 Ileus, unspecified: Secondary | ICD-10-CM | POA: Diagnosis not present

## 2022-05-16 DIAGNOSIS — J9601 Acute respiratory failure with hypoxia: Secondary | ICD-10-CM | POA: Diagnosis not present

## 2022-05-16 DIAGNOSIS — E876 Hypokalemia: Secondary | ICD-10-CM | POA: Diagnosis not present

## 2022-05-16 DIAGNOSIS — G9341 Metabolic encephalopathy: Secondary | ICD-10-CM | POA: Diagnosis not present

## 2022-05-16 DIAGNOSIS — I82409 Acute embolism and thrombosis of unspecified deep veins of unspecified lower extremity: Secondary | ICD-10-CM | POA: Diagnosis not present

## 2022-05-16 DIAGNOSIS — K219 Gastro-esophageal reflux disease without esophagitis: Secondary | ICD-10-CM | POA: Diagnosis not present

## 2022-05-16 DIAGNOSIS — I5033 Acute on chronic diastolic (congestive) heart failure: Secondary | ICD-10-CM | POA: Diagnosis not present

## 2022-05-16 DIAGNOSIS — G4733 Obstructive sleep apnea (adult) (pediatric): Secondary | ICD-10-CM | POA: Diagnosis not present

## 2022-05-16 DIAGNOSIS — J9602 Acute respiratory failure with hypercapnia: Secondary | ICD-10-CM | POA: Diagnosis not present

## 2022-05-16 DIAGNOSIS — D751 Secondary polycythemia: Secondary | ICD-10-CM | POA: Diagnosis not present

## 2022-05-16 NOTE — Telephone Encounter (Signed)
Pt is calling and want a nurse to go over discharge papers from yesterday.

## 2022-05-16 NOTE — Telephone Encounter (Signed)
I called the patient. She was sitting on her porch with her husband and daughter. She had me on speaker phone. They had a question about starting Lasix and seeing a podiatrist. Instructed them to contact her PCP and they were agreeable to this plan.

## 2022-05-17 DIAGNOSIS — K567 Ileus, unspecified: Secondary | ICD-10-CM | POA: Diagnosis not present

## 2022-05-17 DIAGNOSIS — G9341 Metabolic encephalopathy: Secondary | ICD-10-CM | POA: Diagnosis not present

## 2022-05-17 DIAGNOSIS — I110 Hypertensive heart disease with heart failure: Secondary | ICD-10-CM | POA: Diagnosis not present

## 2022-05-17 DIAGNOSIS — I82409 Acute embolism and thrombosis of unspecified deep veins of unspecified lower extremity: Secondary | ICD-10-CM | POA: Diagnosis not present

## 2022-05-17 DIAGNOSIS — J9602 Acute respiratory failure with hypercapnia: Secondary | ICD-10-CM | POA: Diagnosis not present

## 2022-05-17 DIAGNOSIS — E876 Hypokalemia: Secondary | ICD-10-CM | POA: Diagnosis not present

## 2022-05-17 DIAGNOSIS — K219 Gastro-esophageal reflux disease without esophagitis: Secondary | ICD-10-CM | POA: Diagnosis not present

## 2022-05-17 DIAGNOSIS — J9601 Acute respiratory failure with hypoxia: Secondary | ICD-10-CM | POA: Diagnosis not present

## 2022-05-17 DIAGNOSIS — I5033 Acute on chronic diastolic (congestive) heart failure: Secondary | ICD-10-CM | POA: Diagnosis not present

## 2022-05-17 DIAGNOSIS — D751 Secondary polycythemia: Secondary | ICD-10-CM | POA: Diagnosis not present

## 2022-05-17 DIAGNOSIS — D649 Anemia, unspecified: Secondary | ICD-10-CM | POA: Diagnosis not present

## 2022-05-17 DIAGNOSIS — G4733 Obstructive sleep apnea (adult) (pediatric): Secondary | ICD-10-CM | POA: Diagnosis not present

## 2022-05-20 DIAGNOSIS — J984 Other disorders of lung: Secondary | ICD-10-CM | POA: Diagnosis not present

## 2022-05-20 DIAGNOSIS — E662 Morbid (severe) obesity with alveolar hypoventilation: Secondary | ICD-10-CM | POA: Diagnosis not present

## 2022-05-22 DIAGNOSIS — D751 Secondary polycythemia: Secondary | ICD-10-CM | POA: Diagnosis not present

## 2022-05-22 DIAGNOSIS — D649 Anemia, unspecified: Secondary | ICD-10-CM | POA: Diagnosis not present

## 2022-05-22 DIAGNOSIS — J9602 Acute respiratory failure with hypercapnia: Secondary | ICD-10-CM | POA: Diagnosis not present

## 2022-05-22 DIAGNOSIS — E876 Hypokalemia: Secondary | ICD-10-CM | POA: Diagnosis not present

## 2022-05-22 DIAGNOSIS — G4733 Obstructive sleep apnea (adult) (pediatric): Secondary | ICD-10-CM | POA: Diagnosis not present

## 2022-05-22 DIAGNOSIS — K219 Gastro-esophageal reflux disease without esophagitis: Secondary | ICD-10-CM | POA: Diagnosis not present

## 2022-05-22 DIAGNOSIS — G9341 Metabolic encephalopathy: Secondary | ICD-10-CM | POA: Diagnosis not present

## 2022-05-22 DIAGNOSIS — I110 Hypertensive heart disease with heart failure: Secondary | ICD-10-CM | POA: Diagnosis not present

## 2022-05-22 DIAGNOSIS — I82409 Acute embolism and thrombosis of unspecified deep veins of unspecified lower extremity: Secondary | ICD-10-CM | POA: Diagnosis not present

## 2022-05-22 DIAGNOSIS — I5033 Acute on chronic diastolic (congestive) heart failure: Secondary | ICD-10-CM | POA: Diagnosis not present

## 2022-05-22 DIAGNOSIS — K567 Ileus, unspecified: Secondary | ICD-10-CM | POA: Diagnosis not present

## 2022-05-22 DIAGNOSIS — J9601 Acute respiratory failure with hypoxia: Secondary | ICD-10-CM | POA: Diagnosis not present

## 2022-05-25 DIAGNOSIS — J9602 Acute respiratory failure with hypercapnia: Secondary | ICD-10-CM | POA: Diagnosis not present

## 2022-05-25 DIAGNOSIS — G9341 Metabolic encephalopathy: Secondary | ICD-10-CM | POA: Diagnosis not present

## 2022-05-25 DIAGNOSIS — J9601 Acute respiratory failure with hypoxia: Secondary | ICD-10-CM | POA: Diagnosis not present

## 2022-05-25 DIAGNOSIS — I82409 Acute embolism and thrombosis of unspecified deep veins of unspecified lower extremity: Secondary | ICD-10-CM | POA: Diagnosis not present

## 2022-05-25 DIAGNOSIS — E876 Hypokalemia: Secondary | ICD-10-CM | POA: Diagnosis not present

## 2022-05-25 DIAGNOSIS — K219 Gastro-esophageal reflux disease without esophagitis: Secondary | ICD-10-CM | POA: Diagnosis not present

## 2022-05-25 DIAGNOSIS — D649 Anemia, unspecified: Secondary | ICD-10-CM | POA: Diagnosis not present

## 2022-05-25 DIAGNOSIS — D751 Secondary polycythemia: Secondary | ICD-10-CM | POA: Diagnosis not present

## 2022-05-25 DIAGNOSIS — G4733 Obstructive sleep apnea (adult) (pediatric): Secondary | ICD-10-CM | POA: Diagnosis not present

## 2022-05-25 DIAGNOSIS — I110 Hypertensive heart disease with heart failure: Secondary | ICD-10-CM | POA: Diagnosis not present

## 2022-05-25 DIAGNOSIS — K567 Ileus, unspecified: Secondary | ICD-10-CM | POA: Diagnosis not present

## 2022-05-25 DIAGNOSIS — I5033 Acute on chronic diastolic (congestive) heart failure: Secondary | ICD-10-CM | POA: Diagnosis not present

## 2022-05-26 ENCOUNTER — Ambulatory Visit: Payer: 59 | Admitting: Cardiology

## 2022-05-26 DIAGNOSIS — K567 Ileus, unspecified: Secondary | ICD-10-CM | POA: Diagnosis not present

## 2022-05-26 DIAGNOSIS — K219 Gastro-esophageal reflux disease without esophagitis: Secondary | ICD-10-CM | POA: Diagnosis not present

## 2022-05-26 DIAGNOSIS — I5033 Acute on chronic diastolic (congestive) heart failure: Secondary | ICD-10-CM | POA: Diagnosis not present

## 2022-05-26 DIAGNOSIS — D649 Anemia, unspecified: Secondary | ICD-10-CM | POA: Diagnosis not present

## 2022-05-26 DIAGNOSIS — J9601 Acute respiratory failure with hypoxia: Secondary | ICD-10-CM | POA: Diagnosis not present

## 2022-05-26 DIAGNOSIS — I110 Hypertensive heart disease with heart failure: Secondary | ICD-10-CM | POA: Diagnosis not present

## 2022-05-26 DIAGNOSIS — D751 Secondary polycythemia: Secondary | ICD-10-CM | POA: Diagnosis not present

## 2022-05-26 DIAGNOSIS — E876 Hypokalemia: Secondary | ICD-10-CM | POA: Diagnosis not present

## 2022-05-26 DIAGNOSIS — G4733 Obstructive sleep apnea (adult) (pediatric): Secondary | ICD-10-CM | POA: Diagnosis not present

## 2022-05-26 DIAGNOSIS — J9602 Acute respiratory failure with hypercapnia: Secondary | ICD-10-CM | POA: Diagnosis not present

## 2022-05-26 DIAGNOSIS — I82409 Acute embolism and thrombosis of unspecified deep veins of unspecified lower extremity: Secondary | ICD-10-CM | POA: Diagnosis not present

## 2022-05-26 DIAGNOSIS — G9341 Metabolic encephalopathy: Secondary | ICD-10-CM | POA: Diagnosis not present

## 2022-06-02 DIAGNOSIS — K567 Ileus, unspecified: Secondary | ICD-10-CM | POA: Diagnosis not present

## 2022-06-02 DIAGNOSIS — D649 Anemia, unspecified: Secondary | ICD-10-CM | POA: Diagnosis not present

## 2022-06-02 DIAGNOSIS — D751 Secondary polycythemia: Secondary | ICD-10-CM | POA: Diagnosis not present

## 2022-06-02 DIAGNOSIS — E876 Hypokalemia: Secondary | ICD-10-CM | POA: Diagnosis not present

## 2022-06-02 DIAGNOSIS — J9602 Acute respiratory failure with hypercapnia: Secondary | ICD-10-CM | POA: Diagnosis not present

## 2022-06-02 DIAGNOSIS — G9341 Metabolic encephalopathy: Secondary | ICD-10-CM | POA: Diagnosis not present

## 2022-06-02 DIAGNOSIS — I5033 Acute on chronic diastolic (congestive) heart failure: Secondary | ICD-10-CM | POA: Diagnosis not present

## 2022-06-02 DIAGNOSIS — K219 Gastro-esophageal reflux disease without esophagitis: Secondary | ICD-10-CM | POA: Diagnosis not present

## 2022-06-02 DIAGNOSIS — I82409 Acute embolism and thrombosis of unspecified deep veins of unspecified lower extremity: Secondary | ICD-10-CM | POA: Diagnosis not present

## 2022-06-02 DIAGNOSIS — J9601 Acute respiratory failure with hypoxia: Secondary | ICD-10-CM | POA: Diagnosis not present

## 2022-06-02 DIAGNOSIS — I110 Hypertensive heart disease with heart failure: Secondary | ICD-10-CM | POA: Diagnosis not present

## 2022-06-02 DIAGNOSIS — G4733 Obstructive sleep apnea (adult) (pediatric): Secondary | ICD-10-CM | POA: Diagnosis not present

## 2022-06-04 DIAGNOSIS — I5033 Acute on chronic diastolic (congestive) heart failure: Secondary | ICD-10-CM | POA: Diagnosis not present

## 2022-06-04 DIAGNOSIS — I82409 Acute embolism and thrombosis of unspecified deep veins of unspecified lower extremity: Secondary | ICD-10-CM | POA: Diagnosis not present

## 2022-06-05 ENCOUNTER — Institutional Professional Consult (permissible substitution): Payer: 59 | Admitting: Neurology

## 2022-06-09 DIAGNOSIS — R778 Other specified abnormalities of plasma proteins: Secondary | ICD-10-CM | POA: Diagnosis not present

## 2022-06-09 DIAGNOSIS — I10 Essential (primary) hypertension: Secondary | ICD-10-CM | POA: Diagnosis not present

## 2022-06-09 DIAGNOSIS — K922 Gastrointestinal hemorrhage, unspecified: Secondary | ICD-10-CM | POA: Diagnosis not present

## 2022-06-09 DIAGNOSIS — R4182 Altered mental status, unspecified: Secondary | ICD-10-CM | POA: Diagnosis not present

## 2022-06-12 ENCOUNTER — Ambulatory Visit: Payer: 59 | Admitting: Neurology

## 2022-06-12 ENCOUNTER — Encounter: Payer: Self-pay | Admitting: Neurology

## 2022-06-12 VITALS — BP 162/107 | HR 83 | Ht 61.0 in

## 2022-06-12 DIAGNOSIS — G9340 Encephalopathy, unspecified: Secondary | ICD-10-CM

## 2022-06-12 DIAGNOSIS — R4586 Emotional lability: Secondary | ICD-10-CM | POA: Diagnosis not present

## 2022-06-12 DIAGNOSIS — R001 Bradycardia, unspecified: Secondary | ICD-10-CM | POA: Diagnosis not present

## 2022-06-12 DIAGNOSIS — G7281 Critical illness myopathy: Secondary | ICD-10-CM | POA: Diagnosis not present

## 2022-06-12 DIAGNOSIS — R69 Illness, unspecified: Secondary | ICD-10-CM | POA: Diagnosis not present

## 2022-06-12 NOTE — Progress Notes (Signed)
SLEEP MEDICINE CLINIC    Provider:  Melvyn Novas, MD  Primary Care Physician:  [name], MD [address] [place] [address]     Referring Provider: [name], Md [address] [place],  [place] [address]          Chief Complaint according to patient   Patient presents with:     New Patient (Initial Visit)           HISTORY OF PRESENT ILLNESS:   06-12-2022 Tamara Russell is a 61 y.o. year old Black or [place] American female patient seen here as a referral on 06/12/2022 from Dr Gillermo Murdoch office  for a Sleep study???.  She had developed encephalopathy and her personality changed, she is tangential in her thoughts process and off the mark.  She was seen in hospital and there was a discussion about CO2 retention- she was followed by Dr. Teresa Coombs outpatient for this. There was according to the family never a sleep study - but she is here  today to be seen for CPAP - given to her from the hospital. In the nursing facility it was not continued- and she relapsed into confusion. She was readmitted and there second discharge was with a prescription for a CPAP -  She received the machine after July 5th. And it was not brought here with the patient - she is unsure of she uses CPAP or is she on oxygen?  If she is on oxygen she needs to follow pulmonology.  .    EEG Description: No clear posterior dominant rhythm was seen.  EEG showed continuous generalized 5 to 7 Hz theta slowing admixed with intermittent generalized 2-3Hz  delta slowing. Hyperventilation and photic stimulation were not performed.      ABNORMALITY - Continuous slow, generalized   IMPRESSION: This study is suggestive of moderate diffuse encephalopathy, nonspecific etiology. No seizures or epileptiform discharges were seen throughout the recording.   Tamara Russell   Chief concern according to patient : The patient is confused.     Tamara Russell   has a past medical history of   ENCEPHALOPATHY< Acid reflux, COVID in May 2022 , Chronic diastolic heart failure (HCC), Chronic respiratory failure with hypoxia and hypercapnia (HCC), Complication of anesthesia, DVT (deep venous thrombosis) (HCC), History of COVID-19, HLD (hyperlipidemia), and Hypertension. She has seen  Dr Teresa Coombs for her cognitive changes.    The patient reportedly never had a sleep study I. I don't see any documentation in her chart. She had bradycardia    Sleep relevant medical history: Nocturia very frequent, no Sleep walking, nocturnal confusion, agitaton,  CHF.    Family medical /sleep history:  other family member on CPAP with OSA, insomnia, sleep walkers.    This is a 61 year old woman past medical history of obesity, GERD, hypertension, who is presenting after a long hospitalization for encephalopathy.  While in the hospital patient had extensive work-up including MRI, LP, EEGs, neurology consulted and she even had treatment for IVIG for presumed encephalitis.  She continued to have confusion, but improved she was discharge to rehab first and now home.  Currently patient lives at home with husband and kids.  Husband reports that most of the time she dwells about the past and gets very emotional.  She is awake she is alert she is able to respond and communicate properly but again the majority of the time is spent remembering about the past, talking about  her deceased grand mother and being very emotional.  Since leaving the hospital they have not set up care with psychiatry.  Denies any seizures, denies any previous history of psychiatric breakdown       Hospital Course (initial admission): Patient is a 61 y.o.  female morbid obesity, HTN, GERD-who was just discharged from this facility on 4/23-presented to the hospital on 4/24 with altered mental status.  Found to have metabolic encephalopathy.   Significant events: 4/19-4/23>> hospitalization for chest pain, confusion - UTI. 4/24 Presented to the ED for  confusion-admit to [facility]. 4/28 Neuro eval - marital stress possibly causing AMS - psych consult recommended 5/01 Episode of aspiration - bradycardia/worsening hypoxemia - improved after suctioning.CXR with bibasilar opacities-IV Ancef started. 5/04 Neuro re-eval per psych rec's-?autoimmune process causing AMS 5/05 Trial of Depakote per neurology. 5/06 Significant sedation / lethargy following Haldol/Ativan -concern for hypercarbia -BiPAP started. LP postponed. Haldol/Ativan dosage adjusted by psychiatry. 5/07 Intubated, eeg neg neuro consulted.  5/08 LP completed. Cortrak placed. 5/09 Extubated, continues to have agitated delirium with psychosis, and then episodes of minimal responsiveness 5/12 Intubated in setting SBO and suspected aspiration 5/15 Extubated, respiratory status improving and mental status clearing   05/16 transferred to [facility].    5/19 Completed antibiotic course the day before.  Started on IVIG. 5/23, 5 days IVIG completed.  5/24 was started on CPAP after first 3 nights of poor compliance, she has slowly started improving, now compliant,  still some waxing and waning mental status but overall better   Tobacco use- quit 30 years ago .   ETOH use : none,  Caffeine intake: none Regular exercise: none - she is in PT.      Sleep habits are as follows: The patient's dinner time is between 8-9 PM. The patient goes to bed at 11 PM and she refuses CPAP - until after midnight- then she continues to sleep for 4-5 hours, wakes for one of several  bathroom breaks, and she will take off her CPAP.   The preferred sleep position is supine, with the support of 2-3 pillows. She wears depends.  Dreams are reportedly frequent/vivid.   5.30 AM is the usual rise time. The patient wakes up spontaneously.  She reports not feeling refreshed or restored in AM, with symptoms such as dry mouth, morning headaches, and residual fatigue.  Naps are taken rarely -  she is agitated.     Review of  Systems: Out of a complete 14 system review, the patient complains of only the following symptoms, and all other reviewed systems are negative.:  Fatigue, sleepiness , snoring, fragmented sleep, Nocturia, confusion. Insomnia   How likely are you to doze in the following situations: 0 = not likely, 1 = slight chance, 2 = moderate chance, 3 = high chance   Sitting and Reading? Watching Television? Sitting inactive in a public place (theater or meeting)? As a passenger in a car for an hour without a break? Lying down in the afternoon when circumstances permit? Sitting and talking to someone? Sitting quietly after lunch without alcohol? In a car, while stopped for a few minutes in traffic?   Total = 3/ 24 points.   FSS endorsed at NA - was not provided / 63 points.   Social History   Socioeconomic History   Marital status: Married    Spouse name: Not on file   Number of children: Not on file   Years of education: Not on file   Highest education level:  Not on file  Occupational History   Occupation: unemployed  Tobacco Use   Smoking status: Former    Packs/day: 0.50    Years: 20.00    Total pack years: 10.00    Types: Cigarettes    Start date: 04/30/1975    Quit date: 04/30/1995    Years since quitting: 27.1   Smokeless tobacco: Never  Vaping Use   Vaping Use: Never used  Substance and Sexual Activity   Alcohol use: No    Alcohol/week: 0.0 standard drinks of alcohol   Drug use: No   Sexual activity: Not on file  Other Topics Concern   Not on file  Social History Narrative   Not on file   Social Determinants of Health   Financial Resource Strain: Not on file  Food Insecurity: Not on file  Transportation Needs: Not on file  Physical Activity: Not on file  Stress: Not on file  Social Connections: Not on file    Family History  Problem Relation Age of Onset   Cancer Father 55       stomach   Hypertension Father    Diabetes Maternal Grandmother    Colon cancer  Maternal Grandfather    Diabetes Paternal Grandmother    Colon cancer Paternal Grandmother     Past Medical History:  Diagnosis Date   Acid reflux    Chronic diastolic heart failure (HCC)    Chronic respiratory failure with hypoxia and hypercapnia (HCC)    on 2 L [place] continuous   Complication of anesthesia    slow to wake up   DVT (deep venous thrombosis) (HCC)    of LLE ; dx'ed on 03/05/22   History of COVID-19    HLD (hyperlipidemia)    Hypertension     Past Surgical History:  Procedure Laterality Date   ABDOMINAL HYSTERECTOMY     BIOPSY  11/17/2018   Procedure: BIOPSY;  Surgeon: Beverley Fiedler, MD;  Location: [facility];  Service: Gastroenterology;;   BREAST EXCISIONAL BIOPSY Left    benign   BREAST EXCISIONAL BIOPSY Right    benign   ESOPHAGOGASTRODUODENOSCOPY (EGD) WITH PROPOFOL N/A 11/17/2018   Procedure: ESOPHAGOGASTRODUODENOSCOPY (EGD) WITH PROPOFOL;  Surgeon: Beverley Fiedler, MD;  Location: [facility];  Service: Gastroenterology;  Laterality: N/A;     Current Outpatient Medications on File Prior to Visit  Medication Sig Dispense Refill   acetaminophen (TYLENOL) 500 MG tablet Take 1 tablet (500 mg total) by mouth every 6 (six) hours as needed for moderate pain. Reported on 04/21/2016 (Patient taking differently: Take 1,000 mg by mouth every 6 (six) hours as needed for headache (pain).) 30 tablet 0   dapagliflozin propanediol (FARXIGA) 10 MG TABS tablet Take 1 tablet (10 mg total) by mouth daily. 30 tablet 0   diphenhydramine-acetaminophen (TYLENOL PM) 25-500 MG TABS tablet Take 1 tablet by mouth at bedtime as needed (pain/sleep).     ferrous sulfate 324 MG TBEC Take 324 mg by mouth every morning.     Melatonin 10 MG TABS Take 10 mg by mouth at bedtime.     Multiple Vitamins-Minerals (CENTRUM SILVER ADULT 50+ PO) Take 1 tablet by mouth daily.     potassium chloride 20 MEQ TBCR Take 20 mEq by mouth daily. (Patient taking differently: Take 20 mEq by mouth at bedtime.) 30 tablet  3   spironolactone (ALDACTONE) 25 MG tablet Take 25 mg by mouth every morning.     apixaban (ELIQUIS) 5 MG TABS tablet Take 1 tablet (  5 mg total) by mouth 2 (two) times daily. (Patient taking differently: Take 5 mg by mouth at bedtime.) 60 tablet 0   atorvastatin (LIPITOR) 40 MG tablet Take 1 tablet (40 mg total) by mouth daily. (Patient taking differently: Take 40 mg by mouth at bedtime.) 30 tablet 0   irbesartan (AVAPRO) 75 MG tablet Take 0.5 tablets (37.5 mg total) by mouth daily. (Patient taking differently: Take 75 mg by mouth every morning.) 15 tablet 0   metoprolol succinate (TOPROL-XL) 25 MG 24 hr tablet Take 1 tablet (25 mg total) by mouth daily. 30 tablet 0   No current facility-administered medications on file prior to visit.    Allergies  Allergen Reactions   Penicillins Other (See Comments)    Childhood allergy. Pt does not remember reaction Tolerated cefazolin 5/1-5/6, cefepime 5/12-5/14    Physical exam:  Today's Vitals   06/12/22 1310  BP: (!) 162/107  Pulse: 83  SpO2: 98%  Height: 5\' 1"  (1.549 m)   Body mass index is 36.28 kg/m.   Wt Readings from Last 3 Encounters:  05/15/22 192 lb (87.1 kg)  04/19/22 210 lb 1.6 oz (95.3 kg)  04/14/22 216 lb 7.8 oz (98.2 kg)     Ht Readings from Last 3 Encounters:  06/12/22 5\' 1"  (1.549 m)  04/15/22 5\' 1"  (1.549 m)  04/14/22 5\' 1"  (1.549 m)      General: The patient is awake, alert and appears not in acute distress. The patient is groomed. Head: Normocephalic, atraumatic. She is agitated, interrupts the visit and her relatives.  Neck is supple. Mallampati 3,  neck circumference:15 inches . Nasal airflow not fully patent.   Dental status: dentures bottom.  Cardiovascular:  Regular rate and cardiac rhythm by pulse,  without distended neck veins. Respiratory: Lungs are clear to auscultation.  Skin:  With evidence of ankle edema, or rash. Trunk: The patient's posture is erect.   Neurologic exam : The patient is awake  and alert, agitated and impulsive. oriented to place and time.   Memory subjective described as intact.  It is not !  Attention span & concentration ability appears impaired.  Speech is fluent,  without  dysarthria, dysphonia or aphasia.  Mood and affect are appropriate.   Cranial nerves: no loss of smell or taste reported  Pupils are equal and briskly reactive to light. Funduscopic exam deferred. .  Extraocular movements in vertical and horizontal planes were intact and without nystagmus. No Diplopia. Visual fields by finger perimetry are intact. Hearing was intact to soft voice and finger rubbing.    Facial sensation intact to fine touch.  Facial motor strength is symmetric and tongue and uvula move midline.  Neck ROM : rotation, tilt and flexion extension were normal for age and shoulder shrug was symmetrical.    Motor exam:  Symmetric bulk, tone and ROM.   Normal tone without cog wheeling, symmetric grip strength .   Sensory:  Fine touch, pinprick and vibration were tested  and  normal.  Proprioception tested in the upper extremities was normal.   Coordination: Rapid alternating movements in the fingers/hands were of normal speed.  The Finger-to-nose maneuver was intact without evidence of ataxia, dysmetria or tremor.   Gait and station:   Toe and heel walk were deferred.  Deep tendon reflexes: in the  upper and lower extremities are symmetric and intact.  Babinski response was deferred.       After spending a total time of  60  minutes face  to face and additional time for physical and neurologic examination, review of laboratory studies,  personal review of imaging studies, reports and results of other testing and review of referral information / records as far as provided in visit, I have established the following assessments:  1)  the patient has been agitated , confused and impulsive. 2)  Her records show no evidence of a sleep study or diagnostic procedure : OSA  was not  diagnosed in a sleep lab.   3) [facility] and for what ? The family stated she uses 2 L of oxygen.    My Plan is to proceed with:  1) HST for confirmation of baseline condition.  2) I have waited time here not having access to the CPAP data or the oxygen settings.  3) I will need to know who ordered the treatments this patient is on now- ? This patient would have to see pulmonology before sleep medicine.  40 cognitive concerns will need to be adressed with primary Physician and primary neurologist   I would like to thank Dr Teresa Coombsamara, Dr Verl DickerGanji's office and Renaye RakersBland, [place], Md [address] [place] [id number] for allowing me to meet with and to take care of this confused and agitated patient.   In short, Tamara Russell is presenting with unclear dx and documentation history of sleep apnea and hypoxia-  and she has a Corporate investment bankerprimary Neurologist who will follow up with her cognitive challenges.   CC: I will share my notes with Celesta Cardwell , and Dr Teresa Coombsamara. .  Electronically signed by: Melvyn Novasarmen Keenan Trefry, MD 06/12/2022 1:25 PM  [facility] and [facility] Board certified by The [employer] Board of Sleep Medicine and Diplomate of the [employer] of Sleep Medicine. Board certified In Neurology through the ABPN, Fellow of the [employer] of Neurology. Medical Director of [facility].

## 2022-06-12 NOTE — Addendum Note (Signed)
Addended by: Melvyn Novas on: 06/12/2022 02:10 PM   Modules accepted: Orders

## 2022-06-12 NOTE — Patient Instructions (Signed)
Confusion Confusion is the inability to think with your usual speed or clarity. Confusion can be caused by many things. People who are confused often describe their thinking as cloudy or unclear. Confusion can also include feeling disoriented. This means you are unaware of where you are or who you are. You may also not know the date or time. When confused, you may have trouble remembering, paying attention, or making decisions. Some people also act aggressively when they are confused. In some cases, confusion may come on quickly. In other cases, it may develop slowly over time. Confusion may be caused by medical conditions such as: Infections, such as a urinary tract infection (UTI). Low levels of oxygen, which can develop from conditions such as long-term lung disorders. Decrease in brain function due to dementia and other conditions that affect the brain, such as seizures, strokes, brain tumors, or head injuries. Mental health conditions, like panic attacks, anxiety, depression, and hallucinations. Confusion may also be caused by physical factors such as: Loss of fluid (dehydration) or an imbalance of salts and minerals in the body (electrolytes). Lack of certain nutrients like niacin, thiamine, or other B vitamins. Fever or hypothermia, which is a sudden drop in body temperature. Low or high blood sugar. Low or high blood pressure. Other causes include: Lack of sleep or changes in routine or surroundings, such as when traveling or staying in a hospital. Using too much alcohol, drugs, or medicine. Side effects of medicines, or taking medicines that affect other medicines (drug interactions). Follow these instructions at home: Pay attention to your symptoms. Tell your health care provider about any changes or if you develop new symptoms. Follow these instructions to control or treat symptoms. Ask a family member or friend for help if needed. Medicines  Take over-the-counter and prescription  medicines only as told by your health care provider. Ask your health care provider about changing or stopping any medicines that may be causing your confusion. Avoid pain medicines or sleep medicines until you have fully recovered. Use a pillbox or an alarm to help you take the right medicines at the right time. Lifestyle  Eat a balanced diet that includes fruits and vegetables. Get enough sleep. For most adults, this is 7-9 hours each night. Do not drink alcohol. Do not become isolated. Spend time with other people and make plans for your days. Do not drive until your health care provider says that it is safe to do so. Do not use any products that contain nicotine or tobacco, such as cigarettes, e-cigarettes, and chewing tobacco. If you need help quitting, ask your health care provider. Stop other activities that may increase your chances of getting hurt. These may include some work duties, sports activities, swimming, or bike riding. Ask your health care provider what activities are safe for you. Tips for caregivers Find out if the person is confused. Ask the person to state his or her name, age, and the date. If the person is unsure or answers incorrectly, he or she may be confused and need assistance. Always introduce yourself, no matter how well the person knows you. Remind the person of his or her location. Place a calendar and clock near the person who is confused. Keep a regular schedule. Make sure the person has plenty of light during the day and sleep at night. Talk about current events and plans for the day. Keep the environment calm, quiet, and peaceful. Help the person do the things that he or she is unable to   do. These include: Taking medicines. Keeping medical appointments. Helping with household duties, including meal preparation. Running errands. Get help if you need it. There are several support groups for caregivers. If the person you are helping needs more support,  consider day care, extended-care programs, or a skilled nursing facility. The person's health care provider may be able to help evaluate these options. General instructions Monitor yourself for any conditions you may have. These can include: Checking your blood glucose levels if you have diabetes. Maintaining a healthy weight. Monitoring your blood pressure if you have hypertension. Monitoring your body temperature if you have a fever. Keep all follow-up visits. This is important. Contact a health care provider if: You have new symptoms or your symptoms get worse. Get help right away if you: Feel that you are not able to care for yourself. Develop severe headaches, repeated vomiting, seizures, blackouts, or slurred speech. Have increasing confusion, weakness, numbness, restlessness, or personality changes. Develop a loss of balance, have marked dizziness, feel uncoordinated, or fall. Develop severe anxiety, or you have delusions or hallucinations. These symptoms may represent a serious problem that is an emergency. Do not wait to see if the symptoms will go away. Get medical help right away. Call your local emergency services (911 in the [place] Do not drive yourself to the hospital. Summary Confusion is the inability to think with your usual speed or clarity. People who are confused often describe their thinking as cloudy or unclear. Confusion can also include having trouble remembering, paying attention, or making decisions. Confusion may come on quickly or develop slowly over time, depending on the cause. There are many different causes of confusion. Ask for help from family members or friends if you are unable to take care of yourself. This information is not intended to replace advice given to you by your health care provider. Make sure you discuss any questions you have with your health care provider. Document Revised: 01/27/2020 Document Reviewed: 01/27/2020 Elsevier Patient Education   2023 Elsevier Inc.  

## 2022-06-15 ENCOUNTER — Telehealth: Payer: Self-pay | Admitting: Neurology

## 2022-06-15 DIAGNOSIS — G7281 Critical illness myopathy: Secondary | ICD-10-CM

## 2022-06-15 DIAGNOSIS — R001 Bradycardia, unspecified: Secondary | ICD-10-CM

## 2022-06-15 DIAGNOSIS — R4586 Emotional lability: Secondary | ICD-10-CM

## 2022-06-15 DIAGNOSIS — G9340 Encephalopathy, unspecified: Secondary | ICD-10-CM

## 2022-06-15 NOTE — Telephone Encounter (Signed)
Spouse is asking for a referral to be sent to [facility] for pt for the following:PT, OT and for a RN to go out to check pt's vitals 1 -2 times a week.  Pt's spouse states they are in network for his plan and their fax# is [phone]

## 2022-06-15 NOTE — Telephone Encounter (Signed)
Yea, that is fine.

## 2022-06-20 DIAGNOSIS — E662 Morbid (severe) obesity with alveolar hypoventilation: Secondary | ICD-10-CM | POA: Diagnosis not present

## 2022-06-20 DIAGNOSIS — J984 Other disorders of lung: Secondary | ICD-10-CM | POA: Diagnosis not present

## 2022-06-20 NOTE — Telephone Encounter (Signed)
This is from [facility]: She is already open with [facility] Was referred to them from the Hospital beginning of July. Spoke with Medi rep and she is only active with OT, but told her you all put in a new order for RN/PT/OT. She will pull and send it in!

## 2022-06-26 ENCOUNTER — Ambulatory Visit: Payer: 59 | Admitting: Podiatry

## 2022-06-26 ENCOUNTER — Other Ambulatory Visit: Payer: Self-pay | Admitting: *Deleted

## 2022-06-26 DIAGNOSIS — M7989 Other specified soft tissue disorders: Secondary | ICD-10-CM

## 2022-06-26 DIAGNOSIS — M79674 Pain in right toe(s): Secondary | ICD-10-CM | POA: Diagnosis not present

## 2022-06-26 DIAGNOSIS — B351 Tinea unguium: Secondary | ICD-10-CM

## 2022-06-26 DIAGNOSIS — M79675 Pain in left toe(s): Secondary | ICD-10-CM

## 2022-06-26 NOTE — Progress Notes (Signed)
  Subjective:  Patient ID: Tamara Russell, female    DOB: 08/14/1961,  MRN: [id number]  Chief Complaint  Patient presents with   Tinea Pedis      Foot fungus on Both feet.    61 y.o. female presents with the above complaint. History confirmed with patient.  She is here again with her husband as well as her daughter today.  The nails are thick and thick and elongated again.  Objective:  Physical Exam: warm, good capillary refill, no trophic changes or ulcerative lesions, normal DP and PT pulses, normal sensory exam, and thickened elongated brown-yellow discolored toenails with subungual debris.  No evidence of tinea pedis currently  Assessment:   1. Pain due to onychomycosis of toenails of both feet      Plan:  Patient was evaluated and treated and all questions answered.  Discussed the etiology and treatment options for the condition in detail with the patient. Educated patient on the topical and oral treatment options for mycotic nails. Recommended debridement of the nails today. Sharp and mechanical debridement performed of all painful and mycotic nails today. Nails debrided in length and thickness using a nail nipper to level of comfort. Discussed treatment options including appropriate shoe gear. Follow up as needed for painful nails.  I do not think she will be a good candidate for topical treatment with her dementia and I do not think this would be successful anyway.  Her comorbidities and current medications preclude the use of oral medications as well.    Return in about 3 months (around 09/25/2022) for painful thick nails .

## 2022-06-27 ENCOUNTER — Ambulatory Visit (INDEPENDENT_AMBULATORY_CARE_PROVIDER_SITE_OTHER): Payer: 59 | Admitting: Family Medicine

## 2022-06-27 ENCOUNTER — Encounter: Payer: Self-pay | Admitting: Family Medicine

## 2022-06-27 VITALS — BP 142/82 | HR 80 | Temp 98.4°F | Ht 61.0 in | Wt 182.2 lb

## 2022-06-27 DIAGNOSIS — F5105 Insomnia due to other mental disorder: Secondary | ICD-10-CM

## 2022-06-27 DIAGNOSIS — G9340 Encephalopathy, unspecified: Secondary | ICD-10-CM

## 2022-06-27 DIAGNOSIS — I10 Essential (primary) hypertension: Secondary | ICD-10-CM

## 2022-06-27 DIAGNOSIS — G4733 Obstructive sleep apnea (adult) (pediatric): Secondary | ICD-10-CM

## 2022-06-27 DIAGNOSIS — R69 Illness, unspecified: Secondary | ICD-10-CM | POA: Diagnosis not present

## 2022-06-27 DIAGNOSIS — R2241 Localized swelling, mass and lump, right lower limb: Secondary | ICD-10-CM | POA: Insufficient documentation

## 2022-06-27 DIAGNOSIS — F99 Mental disorder, not otherwise specified: Secondary | ICD-10-CM

## 2022-06-27 MED ORDER — QUETIAPINE FUMARATE 25 MG PO TABS: 25.0000 mg | ORAL_TABLET | Freq: Every day | ORAL | 0 refills | Status: DC

## 2022-06-27 NOTE — Patient Instructions (Addendum)
Have home health check blood pressure at home, please have the home health nurse send them to me.

## 2022-06-27 NOTE — Progress Notes (Signed)
New Patient Office Visit  Subjective    Patient ID: Tamara Russell, female    DOB: 05-Jul-1961  Age: 61 y.o. MRN: [id number]  CC:  Chief Complaint  Patient presents with   Establish Care    HPI [facility] presents to establish care Patient reports she was in the hospital and had a severe headache, SOB. She was admitted for metabolic encephalopathy and she was diagnosed with a UTI. Family member reports that she was told she developed acute delirium in the hospital and "hasn't been the  same since." I extensively reviewed her hospital documentation and specialists' notes. Patient's thought process is disjointed and tangential, she is very emotional through the visit. Her daughter is giving most of the history. Patient's recollection of events of the past is very patchy and somewhat unreliable. Daughter reports the patient is emotionally labile, unable to sleep, agitated and paranoid at times. She was also diagnosed with OSA during the hospital stay, has a machine at home but is not sure what kind it is, has not had her sleep study yet, pt states she doesn't want to have one.   Outpatient Encounter Medications as of 06/27/2022  Medication Sig   acetaminophen (TYLENOL) 500 MG tablet Take 1 tablet (500 mg total) by mouth every 6 (six) hours as needed for moderate pain. Reported on 04/21/2016 (Patient taking differently: Take 1,000 mg by mouth every 6 (six) hours as needed for headache (pain).)   amLODipine (NORVASC) 10 MG tablet Take 10 mg by mouth daily.   diphenhydramine-acetaminophen (TYLENOL PM) 25-500 MG TABS tablet Take 1 tablet by mouth at bedtime as needed (pain/sleep).   ferrous sulfate 324 MG TBEC Take 324 mg by mouth every morning.   hydrochlorothiazide (HYDRODIURIL) 25 MG tablet Take 25 mg by mouth daily.   Melatonin 10 MG TABS Take 10 mg by mouth at bedtime.   Multiple Vitamins-Minerals (CENTRUM SILVER ADULT 50+ PO) Take 1 tablet by mouth daily.   potassium chloride 20  MEQ TBCR Take 20 mEq by mouth daily. (Patient taking differently: Take 20 mEq by mouth at bedtime.)   QUEtiapine (SEROQUEL) 25 MG tablet Take 1 tablet (25 mg total) by mouth at bedtime.   [DISCONTINUED] spironolactone (ALDACTONE) 25 MG tablet Take 25 mg by mouth every morning.   metoprolol succinate (TOPROL-XL) 25 MG 24 hr tablet Take 1 tablet (25 mg total) by mouth daily.   [DISCONTINUED] apixaban (ELIQUIS) 5 MG TABS tablet Take 1 tablet (5 mg total) by mouth 2 (two) times daily. (Patient taking differently: Take 5 mg by mouth at bedtime.)   [DISCONTINUED] atorvastatin (LIPITOR) 40 MG tablet Take 1 tablet (40 mg total) by mouth daily. (Patient taking differently: Take 40 mg by mouth at bedtime.)   [DISCONTINUED] dapagliflozin propanediol (FARXIGA) 10 MG TABS tablet Take 1 tablet (10 mg total) by mouth daily.   [DISCONTINUED] irbesartan (AVAPRO) 75 MG tablet Take 0.5 tablets (37.5 mg total) by mouth daily. (Patient taking differently: Take 75 mg by mouth every morning.)   No facility-administered encounter medications on file as of 06/27/2022.    Past Medical History:  Diagnosis Date   Acid reflux    Chronic diastolic heart failure (HCC)    Chronic respiratory failure with hypoxia and hypercapnia (HCC)    on 2 L [place] continuous   Complication of anesthesia    slow to wake up   DVT (deep venous thrombosis) (HCC)    of LLE ; dx'ed on 03/05/22   History of COVID-19  HLD (hyperlipidemia)    Hypertension     Past Surgical History:  Procedure Laterality Date   ABDOMINAL HYSTERECTOMY     BIOPSY  11/17/2018   Procedure: BIOPSY;  Surgeon: Beverley Fiedler, MD;  Location: [facility] ENDOSCOPY;  Service: Gastroenterology;;   BREAST EXCISIONAL BIOPSY Left    benign   BREAST EXCISIONAL BIOPSY Right    benign   ESOPHAGOGASTRODUODENOSCOPY (EGD) WITH PROPOFOL N/A 11/17/2018   Procedure: ESOPHAGOGASTRODUODENOSCOPY (EGD) WITH PROPOFOL;  Surgeon: Beverley Fiedler, MD;  Location: [facility] ENDOSCOPY;  Service:  Gastroenterology;  Laterality: N/A;    Family History  Problem Relation Age of Onset   Cancer Father 60       stomach   Hypertension Father    Diabetes Maternal Grandmother    Colon cancer Maternal Grandfather    Diabetes Paternal Grandmother    Colon cancer Paternal Grandmother     Social History   Socioeconomic History   Marital status: Married    Spouse name: Not on file   Number of children: Not on file   Years of education: Not on file   Highest education level: Not on file  Occupational History   Occupation: unemployed  Tobacco Use   Smoking status: Former    Packs/day: 0.50    Years: 20.00    Total pack years: 10.00    Types: Cigarettes    Start date: 04/30/1975    Quit date: 04/30/1995    Years since quitting: 27.1   Smokeless tobacco: Never  Vaping Use   Vaping Use: Never used  Substance and Sexual Activity   Alcohol use: No    Alcohol/week: 0.0 standard drinks of alcohol   Drug use: No   Sexual activity: Not on file  Other Topics Concern   Not on file  Social History Narrative   Not on file   Social Determinants of Health   Financial Resource Strain: Not on file  Food Insecurity: Not on file  Transportation Needs: Not on file  Physical Activity: Not on file  Stress: Not on file  Social Connections: Not on file  Intimate Partner Violence: Not on file    Review of Systems  Constitutional:  Negative for chills, diaphoresis and fever.  HENT:  Negative for hearing loss.   Eyes:  Negative for blurred vision.  Respiratory:  Negative for shortness of breath.   Cardiovascular:  Positive for leg swelling. Negative for chest pain.  Gastrointestinal:  Negative for abdominal pain, nausea and vomiting.  Genitourinary:  Negative for dysuria.  Neurological:  Negative for dizziness, focal weakness, loss of consciousness and headaches.  Psychiatric/Behavioral:  Positive for memory loss. The patient is nervous/anxious and has insomnia.         Objective     BP (!) 142/82 (BP Location: Left Arm, Patient Position: Sitting, Cuff Size: Large)   Pulse 80   Temp 98.4 F (36.9 C) (Oral)   Ht 5\' 1"  (1.549 m)   Wt 182 lb 3.2 oz (82.6 kg)   SpO2 95%   BMI 34.43 kg/m   Physical Exam Vitals reviewed.  Constitutional:      Appearance: Normal appearance. She is well-groomed. She is obese.  Eyes:     Extraocular Movements: Extraocular movements intact.     Conjunctiva/sclera: Conjunctivae normal.  Cardiovascular:     Rate and Rhythm: Normal rate and regular rhythm.     Pulses: Normal pulses.     Heart sounds: S1 normal and S2 normal.  Pulmonary:  Effort: Pulmonary effort is normal.     Breath sounds: Normal breath sounds and air entry.  Abdominal:     General: Bowel sounds are normal.     Palpations: Abdomen is soft.  Musculoskeletal:     Cervical back: Normal range of motion and neck supple.     Right lower leg: Edema (foot and ankle pitting edema) present.     Left lower leg: Edema (1+ pitting) present.  Skin:    General: Skin is warm and dry.  Neurological:     Mental Status: She is alert and oriented to person, place, and time. Mental status is at baseline.     Gait: Gait is intact.  Psychiatric:        Mood and Affect: Mood and affect normal.        Speech: Speech normal.        Behavior: Behavior normal.        Judgment: Judgment normal.     Last metabolic panel Lab Results  Component Value Date   GLUCOSE 108 (H) 04/19/2022   NA 139 04/19/2022   K 3.5 04/19/2022   CL 98 04/19/2022   CO2 32 04/19/2022   BUN 7 (L) 04/19/2022   CREATININE 0.47 04/19/2022   GFRNONAA >60 04/19/2022   CALCIUM 9.3 04/19/2022   PHOS 4.5 04/16/2022   PROT 5.7 (L) 04/19/2022   ALBUMIN 2.5 (L) 04/19/2022   BILITOT 0.2 (L) 04/19/2022   ALKPHOS 54 04/19/2022   AST 17 04/19/2022   ALT 13 04/19/2022   ANIONGAP 9 04/19/2022   Last hemoglobin A1c Lab Results  Component Value Date   HGBA1C 4.7 (L) 04/16/2022        Assessment & Plan:    Problem List Items Addressed This Visit       Cardiovascular and Mediastinum   Hypertension    Current hypertension medications:       Sig   amLODipine (NORVASC) 10 MG tablet (Taking) Take 10 mg by mouth daily.   hydrochlorothiazide (HYDRODIURIL) 25 MG tablet (Taking) Take 25 mg by mouth daily.   metoprolol succinate (TOPROL-XL) 25 MG 24 hr tablet Take 1 tablet (25 mg total) by mouth daily.  BP is slightly high today however she is upset and agitated in the visit. I advised they continue to monitor her BP at home when she is calm. Her foot swelling is most likely from the amlodipine, she is on blood thinners for her atrial fibrillation so it is not likely a DVT or blood clot.      Relevant Medications   amLODipine (NORVASC) 10 MG tablet   hydrochlorothiazide (HYDRODIURIL) 25 MG tablet     Respiratory   OSA (obstructive sleep apnea)    Has a machine at home, not sure what type it is, I strongly advised she complete the sleep study so that they can confirm the diagnosis and get her the proper settings/ equipment.      Relevant Orders   Ambulatory referral to Home Health     Other   Localized swelling of right foot    Right foot, daughter reports that the DVT she had in the hospital was on the left has just recently completed her Eliquis therapy, has been to see the podiatrist, most likely the swelling is due to the amlodipine she is on for her BP... advised using compression stocking at home while awake. If it does not improve then will send for venous duplex of the right leg. I will see her  back short term to reassess.      Other Visit Diagnoses     Encephalopathy    -  Primary   Relevant Orders   Ambulatory referral to Psychiatry -- Has already seen neurology and has a prolonged complicated hospital stay for this, her encephalopathy could be due to many things including hypercarbia due to the OSA, however it has not improved since coming home from the hospital. I will send in  a referral to psychiatry and order home health for continued physical therapy and monitoring at home.    Ambulatory referral to Home Health   Insomnia due to other mental disorder       Relevant Medications   QUEtiapine (SEROQUEL) 25 MG tablet - will start this daily at bedtime to help with her sleep and her anxiety. RTC in 3 months.     I spent 57 minutes in this new patient complicated visit reviewing her hospital documentation, collecting history from the family members, discussing medications and ordering referrals.  Return in about 3 months (around 09/26/2022) for follow up.   Karie Georges, MD

## 2022-06-28 ENCOUNTER — Telehealth: Payer: Self-pay | Admitting: *Deleted

## 2022-06-28 NOTE — Telephone Encounter (Signed)
Noted  

## 2022-06-28 NOTE — Telephone Encounter (Signed)
The patient and family requested the referral, I would try to choose one in network with her insurance to minimize the cost but I would definitely let them know about the copays and ask them if they want to continue with the referral.

## 2022-06-28 NOTE — Telephone Encounter (Signed)
Tamara Russell, referral coordinator sent a teams message stating PCP requested [facility] for home health but they are no longer accepting her insurance and stated any other home health would require copay. Message sent to PCP requesting to see if she wants to continue with this? If so, Tamara Russell stated she will call to speak with patient.  Message sent to Dr Casimiro Needle.

## 2022-06-29 ENCOUNTER — Ambulatory Visit (HOSPITAL_COMMUNITY)
Admission: RE | Admit: 2022-06-29 | Discharge: 2022-06-29 | Disposition: A | Discharge status: Home / Self Care | Payer: 59 | Source: Ambulatory Visit | Attending: Vascular Surgery | Admitting: Vascular Surgery

## 2022-06-29 DIAGNOSIS — M7989 Other specified soft tissue disorders: Secondary | ICD-10-CM | POA: Insufficient documentation

## 2022-06-30 ENCOUNTER — Telehealth: Payer: Self-pay | Admitting: Family Medicine

## 2022-06-30 NOTE — Telephone Encounter (Signed)
Spoke with Misty Stanley and informed her of the approval for the orders as below.

## 2022-06-30 NOTE — Assessment & Plan Note (Signed)
Has a machine at home, not sure what type it is, I strongly advised she complete the sleep study so that they can confirm the diagnosis and get her the proper settings/ equipment.

## 2022-06-30 NOTE — Telephone Encounter (Signed)
Ilene Qua, RN - [facility] [phone]  Verbal orders:  PT/OT  -  HH to evaluate and treat  Ok to leave a detailed message.

## 2022-06-30 NOTE — Assessment & Plan Note (Addendum)
Current hypertension medications:      Sig   amLODipine (NORVASC) 10 MG tablet (Taking) Take 10 mg by mouth daily.   hydrochlorothiazide (HYDRODIURIL) 25 MG tablet (Taking) Take 25 mg by mouth daily.   metoprolol succinate (TOPROL-XL) 25 MG 24 hr tablet Take 1 tablet (25 mg total) by mouth daily.     BP is slightly high today however she is upset and agitated in the visit. I advised they continue to monitor her BP at home when she is calm. Her foot swelling is most likely from the amlodipine, she is on blood thinners for her atrial fibrillation so it is not likely a DVT or blood clot.

## 2022-06-30 NOTE — Telephone Encounter (Signed)
Ok to call in verbal orders

## 2022-06-30 NOTE — Assessment & Plan Note (Addendum)
Right foot, daughter reports that the DVT she had in the hospital was on the left has just recently completed her Eliquis therapy, has been to see the podiatrist, most likely the swelling is due to the amlodipine she is on for her BP... advised using compression stocking at home while awake. If it does not improve then will send for venous duplex of the right leg. I will see her back short term to reassess.

## 2022-07-03 ENCOUNTER — Telehealth: Payer: Self-pay | Admitting: Family Medicine

## 2022-07-03 NOTE — Telephone Encounter (Signed)
I spoke with the patient's husband and informed him an approval was given to  Chrissie Noa, [place] [phone] last week for PT/OT and advised he contact Lattie Haw with further questions.

## 2022-07-03 NOTE — Telephone Encounter (Signed)
Pt's spouse called to ask if MD could give them a referral for:  In network OT & PT, and nurse to come visit and check her vitals.  Pt prefers [facility]  Fax [phone]

## 2022-07-04 ENCOUNTER — Encounter: Payer: Self-pay | Admitting: Vascular Surgery

## 2022-07-04 ENCOUNTER — Ambulatory Visit: Payer: 59 | Admitting: Vascular Surgery

## 2022-07-04 ENCOUNTER — Telehealth: Payer: Self-pay | Admitting: Family Medicine

## 2022-07-04 DIAGNOSIS — G9341 Metabolic encephalopathy: Secondary | ICD-10-CM | POA: Diagnosis not present

## 2022-07-04 DIAGNOSIS — Z79899 Other long term (current) drug therapy: Secondary | ICD-10-CM | POA: Diagnosis not present

## 2022-07-04 DIAGNOSIS — I82401 Acute embolism and thrombosis of unspecified deep veins of right lower extremity: Secondary | ICD-10-CM | POA: Diagnosis not present

## 2022-07-04 DIAGNOSIS — I110 Hypertensive heart disease with heart failure: Secondary | ICD-10-CM | POA: Diagnosis not present

## 2022-07-04 DIAGNOSIS — R69 Illness, unspecified: Secondary | ICD-10-CM | POA: Diagnosis not present

## 2022-07-04 DIAGNOSIS — I872 Venous insufficiency (chronic) (peripheral): Secondary | ICD-10-CM | POA: Diagnosis not present

## 2022-07-04 DIAGNOSIS — I5032 Chronic diastolic (congestive) heart failure: Secondary | ICD-10-CM | POA: Diagnosis not present

## 2022-07-04 DIAGNOSIS — J9611 Chronic respiratory failure with hypoxia: Secondary | ICD-10-CM | POA: Diagnosis not present

## 2022-07-04 DIAGNOSIS — N390 Urinary tract infection, site not specified: Secondary | ICD-10-CM | POA: Diagnosis not present

## 2022-07-04 DIAGNOSIS — K219 Gastro-esophageal reflux disease without esophagitis: Secondary | ICD-10-CM | POA: Diagnosis not present

## 2022-07-04 DIAGNOSIS — I10 Essential (primary) hypertension: Secondary | ICD-10-CM | POA: Diagnosis not present

## 2022-07-04 DIAGNOSIS — J9612 Chronic respiratory failure with hypercapnia: Secondary | ICD-10-CM | POA: Diagnosis not present

## 2022-07-04 NOTE — Progress Notes (Signed)
Patient name: Tamara Russell MRN: [id number] DOB: 02/06/1961 Sex: female  REASON FOR CONSULT: Evaluate right lower extremity edema  HPI: Tamara Russell is a 61 y.o. female, with history of left peroneal DVT, hypertension, hyperlipidemia, diastolic heart failure who presents for evaluation of right lower extremity swelling.  This has been ongoing for months.  She has been unable to wear compression stockings since they do not fit.  She walks with a walker but today is in a wheelchair so her husband can pusher.  No previous right leg DVT.  No previous right leg vascular interventions.  Past Medical History:  Diagnosis Date   Acid reflux    Chronic diastolic heart failure (HCC)    Chronic respiratory failure with hypoxia and hypercapnia (HCC)    on 2 L [place] continuous   Complication of anesthesia    slow to wake up   DVT (deep venous thrombosis) (HCC)    of LLE ; dx'ed on 03/05/22   History of COVID-19    HLD (hyperlipidemia)    Hypertension     Past Surgical History:  Procedure Laterality Date   ABDOMINAL HYSTERECTOMY     BIOPSY  11/17/2018   Procedure: BIOPSY;  Surgeon: Jerene Bears, MD;  Location: [facility];  Service: Gastroenterology;;   BREAST EXCISIONAL BIOPSY Left    benign   BREAST EXCISIONAL BIOPSY Right    benign   ESOPHAGOGASTRODUODENOSCOPY (EGD) WITH PROPOFOL N/A 11/17/2018   Procedure: ESOPHAGOGASTRODUODENOSCOPY (EGD) WITH PROPOFOL;  Surgeon: Jerene Bears, MD;  Location: [facility];  Service: Gastroenterology;  Laterality: N/A;    Family History  Problem Relation Age of Onset   Cancer Father 22       stomach   Hypertension Father    Diabetes Maternal Grandmother    Colon cancer Maternal Grandfather    Diabetes Paternal Grandmother    Colon cancer Paternal Grandmother     SOCIAL HISTORY: Social History   Socioeconomic History   Marital status: Married    Spouse name: Not on file   Number of children: Not on file   Years of education: Not on  file   Highest education level: Not on file  Occupational History   Occupation: unemployed  Tobacco Use   Smoking status: Former    Packs/day: 0.50    Years: 20.00    Total pack years: 10.00    Types: Cigarettes    Start date: 04/30/1975    Quit date: 04/30/1995    Years since quitting: 27.1   Smokeless tobacco: Never  Vaping Use   Vaping Use: Never used  Substance and Sexual Activity   Alcohol use: No    Alcohol/week: 0.0 standard drinks of alcohol   Drug use: No   Sexual activity: Not on file  Other Topics Concern   Not on file  Social History Narrative   Not on file   Social Determinants of Health   Financial Resource Strain: Not on file  Food Insecurity: Not on file  Transportation Needs: Not on file  Physical Activity: Not on file  Stress: Not on file  Social Connections: Not on file  Intimate Partner Violence: Not on file    Allergies  Allergen Reactions   Penicillins Other (See Comments)    Childhood allergy. Pt does not remember reaction Tolerated cefazolin 5/1-5/6, cefepime 5/12-5/14    Current Outpatient Medications  Medication Sig Dispense Refill   acetaminophen (TYLENOL) 500 MG tablet Take 1 tablet (500 mg total) by mouth every  6 (six) hours as needed for moderate pain. Reported on 04/21/2016 (Patient taking differently: Take 1,000 mg by mouth every 6 (six) hours as needed for headache (pain).) 30 tablet 0   amLODipine (NORVASC) 10 MG tablet Take 10 mg by mouth daily.     diphenhydramine-acetaminophen (TYLENOL PM) 25-500 MG TABS tablet Take 1 tablet by mouth at bedtime as needed (pain/sleep).     ferrous sulfate 324 MG TBEC Take 324 mg by mouth every morning.     hydrochlorothiazide (HYDRODIURIL) 25 MG tablet Take 25 mg by mouth daily.     Melatonin 10 MG TABS Take 10 mg by mouth at bedtime.     metoprolol succinate (TOPROL-XL) 25 MG 24 hr tablet Take 1 tablet (25 mg total) by mouth daily. 30 tablet 0   Multiple Vitamins-Minerals (CENTRUM SILVER ADULT 50+  PO) Take 1 tablet by mouth daily.     potassium chloride 20 MEQ TBCR Take 20 mEq by mouth daily. (Patient taking differently: Take 20 mEq by mouth at bedtime.) 30 tablet 3   QUEtiapine (SEROQUEL) 25 MG tablet Take 1 tablet (25 mg total) by mouth at bedtime. 90 tablet 0   No current facility-administered medications for this visit.    REVIEW OF SYSTEMS:  [X]  denotes positive finding, [ ]  denotes negative finding Cardiac  Comments:  Chest pain or chest pressure:    Shortness of breath upon exertion:    Short of breath when lying flat:    Irregular heart rhythm:        Vascular    Pain in calf, thigh, or hip brought on by ambulation:    Pain in feet at night that wakes you up from your sleep:     Blood clot in your veins:    Leg swelling:  x Right      Pulmonary    Oxygen at home:    Productive cough:     Wheezing:         Neurologic    Sudden weakness in arms or legs:     Sudden numbness in arms or legs:     Sudden onset of difficulty speaking or slurred speech:    Temporary loss of vision in one eye:     Problems with dizziness:         Gastrointestinal    Blood in stool:     Vomited blood:         Genitourinary    Burning when urinating:     Blood in urine:        Psychiatric    Major depression:         Hematologic    Bleeding problems:    Problems with blood clotting too easily:        Skin    Rashes or ulcers:        Constitutional    Fever or chills:      PHYSICAL EXAM: There were no vitals filed for this visit.  GENERAL: The patient is a well-nourished female, in no acute distress. The vital signs are documented above. CARDIAC: There is a regular rate and rhythm.  VASCULAR:  Bilateral DP pulses palpable Notable 1+ right lower extremity edema with no ulcerations or open wounds or discoloration PULMONARY: No respiratory distress. ABDOMEN: Soft and non-tender. MUSCULOSKELETAL: There are no major deformities or cyanosis. NEUROLOGIC: No focal weakness  or paresthesias are detected. SKIN: There are no ulcers or rashes noted. PSYCHIATRIC: The patient has a normal affect.  DATA:  Lower Venous Reflux Study   Patient Name:  Tamara Russell  Date of Exam:   06/29/2022  Medical Rec #: [id number]            Accession #:    [id number]  Date of Birth: 11-11-1960            Patient Gender: F  Patient Age:   2 years  Exam Location:  [facility]  Procedure:      VAS [place] LOWER EXTREMITY VENOUS REFLUX  Referring Phys: Sherald Hess    ---------------------------------------------------------------------------  -----     Indications: Edema.     Risk Factors: Immobility DVT Hx DVT LT TP trunk and Peroneal Veins.  03/05/22.  Limitations: Body habitus.  Comparison Study: B/L LE DVT study is negative. 04/11/22.   Performing Technologist: Lowell Guitar RVT, RDMS      Examination Guidelines: A complete evaluation includes B-mode imaging,  spectral  Doppler, color Doppler, and power Doppler as needed of all accessible  portions  of each vessel. Bilateral testing is considered an integral part of a  complete  examination. Limited examinations for reoccurring indications may be  performed  as noted. The reflux portion of the exam is performed with the patient in  reverse Trendelenburg.  Significant venous reflux is defined as >500 ms in the superficial venous  system, and >1 second in the deep venous system.      Venous Reflux Times  +--------------+--------+------+----------+-----------+--------------------  ---+  RIGHT         Reflux  Reflux  Reflux   Diameter  Comments                                 No       Yes     Time       cms                               +--------------+--------+------+----------+-----------+--------------------  ---+  CFV                    yes  >1 second                                       +--------------+--------+------+----------+-----------+--------------------   ---+  FV prox       no                                                            +--------------+--------+------+----------+-----------+--------------------  ---+  FV mid        no                                 duplicated veins           +--------------+--------+------+----------+-----------+--------------------  ---+  FV dist       no                                                            +--------------+--------+------+----------+-----------+--------------------  ---+  Popliteal     no                                 duplicated  popliteal                                                      vein                       +--------------+--------+------+----------+-----------+--------------------  ---+  [facility]    no                         0.48                               +--------------+--------+------+----------+-----------+--------------------  ---+  GSV prox thighno                         0.58                               +--------------+--------+------+----------+-----------+--------------------  ---+  GSV mid thigh no                         0.37                               +--------------+--------+------+----------+-----------+--------------------  ---+  GSV dist thighno                         0.41                               +--------------+--------+------+----------+-----------+--------------------  ---+  GSV at knee   no                         0.40                               +--------------+--------+------+----------+-----------+--------------------  ---+  GSV prox calf no                         0.27                               +--------------+--------+------+----------+-----------+--------------------  ---+  SSV Pop Fossa no                         0.28                                +--------------+--------+------+----------+-----------+--------------------  ---+  SSV prox calf no                         0.35                               +--------------+--------+------+----------+-----------+--------------------  ---+  Summary:  Right:  - No evidence of deep vein thrombosis seen in the right lower extremity,  from the common femoral through the popliteal veins.  - No evidence of superficial venous thrombosis in the right lower  extremity.     - No evidence of superficial venous reflux seen in the right greater  saphenous vein.  - No evidence of superficial venous reflux seen in the right short  saphenous vein.     - Venous reflux is noted in the right common femoral vein.   Assessment/Plan:  61 year old female presents with chronic right lower extremity edema.  Discussed that certainly chronic venous insufficiency is likely an underlying contributing factor.  I reviewed her reflux study today that shows no right leg DVT.  She does have venous reflux in the deep venous system in the right common femoral vein.  Discussed this this is not amendable to surgical intervention.  I recommended conservative measures with leg elevation, exercise and compression stockings.  We did get her sized for knee-high medical grade compression stockings 15 to 20 mmHg today.  She can follow-up with me as needed.  Has palpable pedal pulses and no signs of arterial insufficiency.   Cephus Shelling, MD [facility] [employer] [facility] Office: [phone]

## 2022-07-04 NOTE — Telephone Encounter (Signed)
Mr Lovan called back and was informed of the message below.

## 2022-07-04 NOTE — Telephone Encounter (Signed)
Pt husband is calling and requesting a referral to [facility] phone number [phone] and fax number [phone] for PT, OT and nurse aid dx encelopathy

## 2022-07-04 NOTE — Telephone Encounter (Signed)
Ok to place referral.

## 2022-07-04 NOTE — Telephone Encounter (Signed)
I called [facility] and left a detailed message on [facility] voicemail with the approval for verbal orders as below.  Spoke with the patient's husband, attempted to inform him of this, was placed on hold while hearing food order placed and I disconnected the call.

## 2022-07-05 DIAGNOSIS — I5033 Acute on chronic diastolic (congestive) heart failure: Secondary | ICD-10-CM | POA: Diagnosis not present

## 2022-07-05 DIAGNOSIS — I82409 Acute embolism and thrombosis of unspecified deep veins of unspecified lower extremity: Secondary | ICD-10-CM | POA: Diagnosis not present

## 2022-07-06 ENCOUNTER — Telehealth: Payer: Self-pay | Admitting: Family Medicine

## 2022-07-06 NOTE — Telephone Encounter (Signed)
Ok to send in verbal order

## 2022-07-06 NOTE — Telephone Encounter (Signed)
[facility] informed of verbal orders

## 2022-07-06 NOTE — Telephone Encounter (Signed)
Tamara Russell from [facility] call and stated she need order for Home Health PT for once a wk for 4 wk's and 1 x a wk for 5 wk's also need occupational therapy for evaluation. +

## 2022-07-10 ENCOUNTER — Telehealth: Payer: Self-pay | Admitting: Family Medicine

## 2022-07-10 DIAGNOSIS — I5032 Chronic diastolic (congestive) heart failure: Secondary | ICD-10-CM | POA: Diagnosis not present

## 2022-07-10 DIAGNOSIS — R4182 Altered mental status, unspecified: Secondary | ICD-10-CM | POA: Diagnosis not present

## 2022-07-10 DIAGNOSIS — R778 Other specified abnormalities of plasma proteins: Secondary | ICD-10-CM | POA: Diagnosis not present

## 2022-07-10 DIAGNOSIS — G9341 Metabolic encephalopathy: Secondary | ICD-10-CM | POA: Diagnosis not present

## 2022-07-10 DIAGNOSIS — R69 Illness, unspecified: Secondary | ICD-10-CM | POA: Diagnosis not present

## 2022-07-10 DIAGNOSIS — J9611 Chronic respiratory failure with hypoxia: Secondary | ICD-10-CM | POA: Diagnosis not present

## 2022-07-10 DIAGNOSIS — Z79899 Other long term (current) drug therapy: Secondary | ICD-10-CM | POA: Diagnosis not present

## 2022-07-10 DIAGNOSIS — I110 Hypertensive heart disease with heart failure: Secondary | ICD-10-CM | POA: Diagnosis not present

## 2022-07-10 DIAGNOSIS — I82401 Acute embolism and thrombosis of unspecified deep veins of right lower extremity: Secondary | ICD-10-CM | POA: Diagnosis not present

## 2022-07-10 DIAGNOSIS — I10 Essential (primary) hypertension: Secondary | ICD-10-CM | POA: Diagnosis not present

## 2022-07-10 DIAGNOSIS — G9340 Encephalopathy, unspecified: Secondary | ICD-10-CM

## 2022-07-10 DIAGNOSIS — J9612 Chronic respiratory failure with hypercapnia: Secondary | ICD-10-CM | POA: Diagnosis not present

## 2022-07-10 DIAGNOSIS — K922 Gastrointestinal hemorrhage, unspecified: Secondary | ICD-10-CM | POA: Diagnosis not present

## 2022-07-10 DIAGNOSIS — N390 Urinary tract infection, site not specified: Secondary | ICD-10-CM | POA: Diagnosis not present

## 2022-07-10 DIAGNOSIS — K219 Gastro-esophageal reflux disease without esophagitis: Secondary | ICD-10-CM | POA: Diagnosis not present

## 2022-07-10 NOTE — Telephone Encounter (Signed)
Pt's spouse called to ask MD to fax order to [facility] [phone]  For in home non-medical aide.

## 2022-07-10 NOTE — Telephone Encounter (Signed)
Spoke with receptionist at [facility] [phone]) and she stated they do not take verbal orders.  Order was entered as below and community message was sent to Patsy Baltimore and Abelina Bachelor.

## 2022-07-10 NOTE — Telephone Encounter (Signed)
Ok to send verbal order

## 2022-07-11 NOTE — Telephone Encounter (Signed)
Message below received from [facility]:  Kingsley Plan, CMA; Patsy Baltimore; Quentin Cornwall, [place] morning [place] found that patient is already active with [facility]. This order will need to go to them to add to her current care.   [place] can provide more information if needed (her ph# is [phone]). As I am off after today until next week.   Thanks  Angie   -I left a detailed message at the patient's home number with the information above for Mr Kratzke as a patient cannot have 2 home health companies.

## 2022-07-12 DIAGNOSIS — N390 Urinary tract infection, site not specified: Secondary | ICD-10-CM | POA: Diagnosis not present

## 2022-07-12 DIAGNOSIS — G9341 Metabolic encephalopathy: Secondary | ICD-10-CM | POA: Diagnosis not present

## 2022-07-12 DIAGNOSIS — I110 Hypertensive heart disease with heart failure: Secondary | ICD-10-CM | POA: Diagnosis not present

## 2022-07-12 DIAGNOSIS — K219 Gastro-esophageal reflux disease without esophagitis: Secondary | ICD-10-CM | POA: Diagnosis not present

## 2022-07-12 DIAGNOSIS — Z79899 Other long term (current) drug therapy: Secondary | ICD-10-CM | POA: Diagnosis not present

## 2022-07-12 DIAGNOSIS — I5032 Chronic diastolic (congestive) heart failure: Secondary | ICD-10-CM | POA: Diagnosis not present

## 2022-07-12 DIAGNOSIS — J9611 Chronic respiratory failure with hypoxia: Secondary | ICD-10-CM | POA: Diagnosis not present

## 2022-07-12 DIAGNOSIS — R69 Illness, unspecified: Secondary | ICD-10-CM | POA: Diagnosis not present

## 2022-07-12 DIAGNOSIS — I82401 Acute embolism and thrombosis of unspecified deep veins of right lower extremity: Secondary | ICD-10-CM | POA: Diagnosis not present

## 2022-07-12 DIAGNOSIS — J9612 Chronic respiratory failure with hypercapnia: Secondary | ICD-10-CM | POA: Diagnosis not present

## 2022-07-13 DIAGNOSIS — Z79899 Other long term (current) drug therapy: Secondary | ICD-10-CM | POA: Diagnosis not present

## 2022-07-13 DIAGNOSIS — I82401 Acute embolism and thrombosis of unspecified deep veins of right lower extremity: Secondary | ICD-10-CM | POA: Diagnosis not present

## 2022-07-13 DIAGNOSIS — J9612 Chronic respiratory failure with hypercapnia: Secondary | ICD-10-CM | POA: Diagnosis not present

## 2022-07-13 DIAGNOSIS — R69 Illness, unspecified: Secondary | ICD-10-CM | POA: Diagnosis not present

## 2022-07-13 DIAGNOSIS — I5032 Chronic diastolic (congestive) heart failure: Secondary | ICD-10-CM | POA: Diagnosis not present

## 2022-07-13 DIAGNOSIS — G9341 Metabolic encephalopathy: Secondary | ICD-10-CM | POA: Diagnosis not present

## 2022-07-13 DIAGNOSIS — J9611 Chronic respiratory failure with hypoxia: Secondary | ICD-10-CM | POA: Diagnosis not present

## 2022-07-13 DIAGNOSIS — N390 Urinary tract infection, site not specified: Secondary | ICD-10-CM | POA: Diagnosis not present

## 2022-07-13 DIAGNOSIS — I110 Hypertensive heart disease with heart failure: Secondary | ICD-10-CM | POA: Diagnosis not present

## 2022-07-13 DIAGNOSIS — K219 Gastro-esophageal reflux disease without esophagitis: Secondary | ICD-10-CM | POA: Diagnosis not present

## 2022-07-14 ENCOUNTER — Telehealth: Payer: Self-pay | Admitting: Family Medicine

## 2022-07-14 NOTE — Telephone Encounter (Signed)
I called Tamara Russell for more information and she stated the main office will fax paperwork to the PCP for sign off.

## 2022-07-14 NOTE — Telephone Encounter (Signed)
Tamara Russell call from [facility] and stated she  have completed the evaluation.

## 2022-07-17 ENCOUNTER — Ambulatory Visit (INDEPENDENT_AMBULATORY_CARE_PROVIDER_SITE_OTHER): Payer: 59 | Admitting: Pulmonary Disease

## 2022-07-17 ENCOUNTER — Encounter: Payer: Self-pay | Admitting: Pulmonary Disease

## 2022-07-17 ENCOUNTER — Telehealth: Payer: Self-pay | Admitting: Neurology

## 2022-07-17 VITALS — BP 124/80 | HR 71 | Temp 98.2°F | Ht 61.5 in | Wt 184.8 lb

## 2022-07-17 DIAGNOSIS — R0683 Snoring: Secondary | ICD-10-CM | POA: Diagnosis not present

## 2022-07-17 NOTE — Progress Notes (Signed)
Tamara Russell    [id number]    01/03/1961  Primary Care Physician:Michael, Royston Cowper, MD  Referring Physician: Larey Seat, MD [address] [place],  [place] [address]  Chief complaint:    Concern for sleep disordered breathing HPI:  Recent extended hospitalization Memory issues recently  Found to have respiratory failure, following reevaluation in the hospital, she ended up going home with a trilogy ventilator  She is feeling generally well  Accompanied by spouse and daughter They do feel like her mind has not recovered back to baseline  History of snoring, erratic breathing at night Concern for sleep disordered breathing while she was hospitalized  Reformed smoker quit over 30 years ago  Usually tries to go to bed about 11, will watch TV for well will finally get to sleep about 1 AM Final up time about 9 AM Wakes up a few times to go to the bathroom at night up to 4 times  History of hypertension  Outpatient Encounter Medications as of 07/17/2022  Medication Sig   acetaminophen (TYLENOL) 500 MG tablet Take 1 tablet (500 mg total) by mouth every 6 (six) hours as needed for moderate pain. Reported on 04/21/2016 (Patient taking differently: Take 1,000 mg by mouth every 6 (six) hours as needed for headache (pain).)   amLODipine (NORVASC) 10 MG tablet Take 10 mg by mouth daily.   diphenhydramine-acetaminophen (TYLENOL PM) 25-500 MG TABS tablet Take 1 tablet by mouth at bedtime as needed (pain/sleep).   ferrous sulfate 324 MG TBEC Take 324 mg by mouth every morning.   hydrochlorothiazide (HYDRODIURIL) 25 MG tablet Take 25 mg by mouth daily.   Melatonin 10 MG TABS Take 10 mg by mouth at bedtime.   Multiple Vitamins-Minerals (CENTRUM SILVER ADULT 50+ PO) Take 1 tablet by mouth daily.   potassium chloride 20 MEQ TBCR Take 20 mEq by mouth daily. (Patient taking differently: Take 20 mEq by mouth at bedtime.)   QUEtiapine (SEROQUEL) 25 MG tablet Take  1 tablet (25 mg total) by mouth at bedtime.   atorvastatin (LIPITOR) 40 MG tablet Take 40 mg by mouth daily.   ELIQUIS 5 MG TABS tablet Take 5 mg by mouth daily.   metoprolol succinate (TOPROL-XL) 25 MG 24 hr tablet Take 1 tablet (25 mg total) by mouth daily.   [DISCONTINUED] ferrous gluconate (FERGON) 324 MG tablet Take 324 mg by mouth daily.   No facility-administered encounter medications on file as of 07/17/2022.    Allergies as of 07/17/2022 - Review Complete 07/17/2022  Allergen Reaction Noted   Penicillins Other (See Comments) 07/27/2013    Past Medical History:  Diagnosis Date   Acid reflux    Chronic diastolic heart failure (HCC)    Chronic respiratory failure with hypoxia and hypercapnia (HCC)    on 2 L [place] continuous   Complication of anesthesia    slow to wake up   DVT (deep venous thrombosis) (HCC)    of LLE ; dx'ed on 03/05/22   History of COVID-19    HLD (hyperlipidemia)    Hypertension     Past Surgical History:  Procedure Laterality Date   ABDOMINAL HYSTERECTOMY     BIOPSY  11/17/2018   Procedure: BIOPSY;  Surgeon: Jerene Bears, MD;  Location: [facility];  Service: Gastroenterology;;   BREAST EXCISIONAL BIOPSY Left    benign   BREAST EXCISIONAL BIOPSY Right    benign   ESOPHAGOGASTRODUODENOSCOPY (EGD) WITH PROPOFOL N/A 11/17/2018  Procedure: ESOPHAGOGASTRODUODENOSCOPY (EGD) WITH PROPOFOL;  Surgeon: Beverley Fiedler, MD;  Location: [facility];  Service: Gastroenterology;  Laterality: N/A;    Family History  Problem Relation Age of Onset   Cancer Father 25       stomach   Hypertension Father    Diabetes Maternal Grandmother    Colon cancer Maternal Grandfather    Diabetes Paternal Grandmother    Colon cancer Paternal Grandmother     Social History   Socioeconomic History   Marital status: Married    Spouse name: Not on file   Number of children: Not on file   Years of education: Not on file   Highest education level: Not on file  Occupational  History   Occupation: unemployed  Tobacco Use   Smoking status: Former    Packs/day: 0.50    Years: 20.00    Total pack years: 10.00    Types: Cigarettes    Start date: 04/30/1975    Quit date: 04/30/1995    Years since quitting: 27.2   Smokeless tobacco: Never  Vaping Use   Vaping Use: Never used  Substance and Sexual Activity   Alcohol use: No    Alcohol/week: 0.0 standard drinks of alcohol   Drug use: No   Sexual activity: Not on file  Other Topics Concern   Not on file  Social History Narrative   Not on file   Social Determinants of Health   Financial Resource Strain: Not on file  Food Insecurity: Not on file  Transportation Needs: Not on file  Physical Activity: Not on file  Stress: Not on file  Social Connections: Not on file  Intimate Partner Violence: Not on file    Review of Systems  Respiratory:  Negative for shortness of breath.   Musculoskeletal:  Positive for arthralgias.  Psychiatric/Behavioral:  Positive for sleep disturbance.     Vitals:   07/17/22 1519  BP: 124/80  Pulse: 71  Temp: 98.2 F (36.8 C)  SpO2: 96%     Physical Exam Constitutional:      Appearance: She is obese.  HENT:     Head: Normocephalic.     Mouth/Throat:     Mouth: Mucous membranes are moist.  Cardiovascular:     Rate and Rhythm: Normal rate and regular rhythm.     Heart sounds: No murmur heard.    No friction rub.  Pulmonary:     Effort: No respiratory distress.     Breath sounds: No stridor. No wheezing or rhonchi.  Musculoskeletal:     Cervical back: No rigidity or tenderness.  Neurological:     Mental Status: She is alert.  Psychiatric:        Mood and Affect: Mood normal.    Data Reviewed: No previous sleep study available  Records from the hospital reviewed  ABG in the hospital did with reveal hypercapnia  Assessment:  High probability of significant obstructive sleep apnea  History of chronic respiratory failure  Hypertension  Diastolic  heart failure  History of DVT   Plan/Recommendations: Schedule patient for split-night study  Graded exercise as tolerated  Encouraged to avoid drinking a significant amount of water too late into the evening as this will contribute to nocturia  Encouraged to call with any significant concerns  Follow-up following sleep study  Encouraged to continue using a trilogy ventilator nightly     Virl Diamond MD [facility] 07/17/2022, 3:24 PM  CC: Dohmeier, Porfirio Mylar, MD

## 2022-07-17 NOTE — Telephone Encounter (Signed)
[employer] pending uploaded notes  

## 2022-07-17 NOTE — Patient Instructions (Signed)
We will schedule you for an in lab polysomnogram  I will see you following the study  Graded exercise as tolerated  Strict sleep routine as best as you can  Try to hydrate during the day not too close to bedtime  Call us with significant concerns  Tentative appointment in about 3 months

## 2022-07-18 DIAGNOSIS — I5032 Chronic diastolic (congestive) heart failure: Secondary | ICD-10-CM | POA: Diagnosis not present

## 2022-07-18 DIAGNOSIS — G9341 Metabolic encephalopathy: Secondary | ICD-10-CM | POA: Diagnosis not present

## 2022-07-18 DIAGNOSIS — I110 Hypertensive heart disease with heart failure: Secondary | ICD-10-CM | POA: Diagnosis not present

## 2022-07-18 DIAGNOSIS — K219 Gastro-esophageal reflux disease without esophagitis: Secondary | ICD-10-CM | POA: Diagnosis not present

## 2022-07-18 DIAGNOSIS — I82401 Acute embolism and thrombosis of unspecified deep veins of right lower extremity: Secondary | ICD-10-CM | POA: Diagnosis not present

## 2022-07-18 DIAGNOSIS — R69 Illness, unspecified: Secondary | ICD-10-CM | POA: Diagnosis not present

## 2022-07-18 DIAGNOSIS — Z79899 Other long term (current) drug therapy: Secondary | ICD-10-CM | POA: Diagnosis not present

## 2022-07-18 DIAGNOSIS — J9612 Chronic respiratory failure with hypercapnia: Secondary | ICD-10-CM | POA: Diagnosis not present

## 2022-07-18 DIAGNOSIS — J9611 Chronic respiratory failure with hypoxia: Secondary | ICD-10-CM | POA: Diagnosis not present

## 2022-07-18 DIAGNOSIS — N390 Urinary tract infection, site not specified: Secondary | ICD-10-CM | POA: Diagnosis not present

## 2022-07-19 ENCOUNTER — Telehealth: Payer: Self-pay | Admitting: *Deleted

## 2022-07-19 NOTE — Telephone Encounter (Signed)
Checked the status on the portal it is still pending.  

## 2022-07-19 NOTE — Telephone Encounter (Signed)
Patient's husband is calling to request for a brace suggested by PT, it would help patient with her posture w/ walking, stand better and to walk straight.he did not know the name of the brace but will call back.

## 2022-07-20 DIAGNOSIS — J984 Other disorders of lung: Secondary | ICD-10-CM | POA: Diagnosis not present

## 2022-07-20 DIAGNOSIS — E662 Morbid (severe) obesity with alveolar hypoventilation: Secondary | ICD-10-CM | POA: Diagnosis not present

## 2022-07-25 ENCOUNTER — Ambulatory Visit: Payer: 59 | Admitting: Cardiology

## 2022-07-25 ENCOUNTER — Encounter: Payer: Self-pay | Admitting: Cardiology

## 2022-07-25 VITALS — BP 158/84 | HR 74 | Temp 98.0°F | Resp 16 | Ht 61.0 in | Wt 186.0 lb

## 2022-07-25 DIAGNOSIS — I10 Essential (primary) hypertension: Secondary | ICD-10-CM | POA: Diagnosis not present

## 2022-07-25 DIAGNOSIS — Z86718 Personal history of other venous thrombosis and embolism: Secondary | ICD-10-CM

## 2022-07-25 DIAGNOSIS — Z6841 Body Mass Index (BMI) 40.0 and over, adult: Secondary | ICD-10-CM | POA: Diagnosis not present

## 2022-07-25 DIAGNOSIS — R0609 Other forms of dyspnea: Secondary | ICD-10-CM

## 2022-07-25 DIAGNOSIS — G4733 Obstructive sleep apnea (adult) (pediatric): Secondary | ICD-10-CM | POA: Diagnosis not present

## 2022-07-25 MED ORDER — OLMESARTAN MEDOXOMIL-HCTZ 40-25 MG PO TABS: 1.0000 | ORAL_TABLET | ORAL | 2 refills | Status: DC

## 2022-07-25 NOTE — Progress Notes (Signed)
Primary Physician/Referring:  Farrel Conners, MD  Patient ID: Tamara Russell, female    DOB: 1960-12-18, 61 y.o.   MRN: [id number]  Chief Complaint  Patient presents with   Hypertension   Follow-up    6 week   Results    monitor   HPI:    Tamara Russell  is a 61 y.o. African-American female patient with primary hypertension, morbid obesity, chronic diastolic heart failure, OSA not on CPAP, restrictive lung disease secondary to obesity and hypercarbic respiratory failure admitted to the hospital with altered mental status in July 2023, chronic lower extremity edema and right lower extremity venous insufficiency related to right common femoral vein reflux not amenable for endovascular therapy presents here for follow-up of shortness of breath, seen 3 months ago by [place].  On further review of her charts, she has been having mental status changes and ED evaluations since April of this year with hypoxemia, altered mental status, encephalopathy and agitation.  Patient over time states is back to herself, she is trying her best to lose weight, she is taking all her medications as prescribed but does not know what they are for.  She is concerned that she has no cardiac issues.  Past Medical History:  Diagnosis Date   Acid reflux    Chronic diastolic heart failure (HCC)    Chronic respiratory failure with hypoxia and hypercapnia (HCC)    on 2 L [place] continuous   Complication of anesthesia    slow to wake up   DVT (deep venous thrombosis) (HCC)    of LLE ; dx'ed on 03/05/22   History of COVID-19    HLD (hyperlipidemia)    Hypertension    Past Surgical History:  Procedure Laterality Date   ABDOMINAL HYSTERECTOMY     BIOPSY  11/17/2018   Procedure: BIOPSY;  Surgeon: Jerene Bears, MD;  Location: [facility];  Service: Gastroenterology;;   BREAST EXCISIONAL BIOPSY Left    benign   BREAST EXCISIONAL BIOPSY Right    benign   ESOPHAGOGASTRODUODENOSCOPY (EGD) WITH PROPOFOL N/A  11/17/2018   Procedure: ESOPHAGOGASTRODUODENOSCOPY (EGD) WITH PROPOFOL;  Surgeon: Jerene Bears, MD;  Location: [facility];  Service: Gastroenterology;  Laterality: N/A;   Family History  Problem Relation Age of Onset   Cancer Father 90       stomach   Hypertension Father    Diabetes Maternal Grandmother    Colon cancer Maternal Grandfather    Diabetes Paternal Grandmother    Colon cancer Paternal Grandmother     Social History   Tobacco Use   Smoking status: Former    Packs/day: 0.50    Years: 20.00    Total pack years: 10.00    Types: Cigarettes    Start date: 04/30/1975    Quit date: 04/30/1995    Years since quitting: 27.2   Smokeless tobacco: Never  Substance Use Topics   Alcohol use: No    Alcohol/week: 0.0 standard drinks of alcohol   Marital Status: Married  ROS  Review of Systems  Cardiovascular:  Positive for dyspnea on exertion. Negative for chest pain, leg swelling and orthopnea.  Neurological:  Positive for disturbances in coordination.   Objective      07/25/2022    3:40 PM 07/17/2022    3:19 PM 07/04/2022   12:08 PM  Vitals with BMI  Height 5' 1"  5' 1.5" 5' 1.5"  Weight 186 lbs 184 lbs 13 oz   BMI 17.49 44.96   Systolic  323 557 322  Diastolic 84 80 92  Pulse 74 71 64   Today's Vitals   07/25/22 1540  BP: (!) 158/84  Pulse: 74  Resp: 16  Temp: 98 F (36.7 C)  TempSrc: Temporal  SpO2: 98%  Weight: 186 lb (84.4 kg)  Height: 5' 1"  (1.549 m)   Body mass index is 35.14 kg/m.  Physical Exam Constitutional:      Appearance: She is obese.  Neck:     Vascular: No carotid bruit or JVD.  Cardiovascular:     Rate and Rhythm: Normal rate and regular rhythm.     Pulses: Normal pulses and intact distal pulses.     Heart sounds: Normal heart sounds. No murmur heard.    No gallop.  Pulmonary:     Effort: Pulmonary effort is normal.     Breath sounds: Normal breath sounds.  Abdominal:     General: Bowel sounds are normal.     Palpations: Abdomen  is soft.     Comments: Obese. Pannus present  Musculoskeletal:     Right lower leg: No edema.     Left lower leg: No edema.    Medications and allergies   Allergies  Allergen Reactions   Penicillins Other (See Comments)    Childhood allergy. Pt does not remember reaction Tolerated cefazolin 5/1-5/6, cefepime 5/12-5/14    Medication list after today's encounter   Current Outpatient Medications:    acetaminophen (TYLENOL) 500 MG tablet, Take 1 tablet (500 mg total) by mouth every 6 (six) hours as needed for moderate pain. Reported on 04/21/2016 (Patient taking differently: Take 1,000 mg by mouth every 6 (six) hours as needed for headache (pain).), Disp: 30 tablet, Rfl: 0   amLODipine (NORVASC) 10 MG tablet, Take 10 mg by mouth daily., Disp: , Rfl:    atorvastatin (LIPITOR) 40 MG tablet, Take 40 mg by mouth daily., Disp: , Rfl:    diphenhydramine-acetaminophen (TYLENOL PM) 25-500 MG TABS tablet, Take 1 tablet by mouth at bedtime as needed (pain/Russell)., Disp: , Rfl:    ferrous sulfate 324 MG TBEC, Take 324 mg by mouth every morning., Disp: , Rfl:    metoprolol succinate (TOPROL-XL) 25 MG 24 hr tablet, Take 1 tablet (25 mg total) by mouth daily., Disp: 30 tablet, Rfl: 0   Multiple Vitamins-Minerals (CENTRUM SILVER ADULT 50+ PO), Take 1 tablet by mouth daily., Disp: , Rfl:    olmesartan-hydrochlorothiazide (BENICAR HCT) 40-25 MG tablet, Take 1 tablet by mouth every morning., Disp: 30 tablet, Rfl: 2   QUEtiapine (SEROQUEL) 25 MG tablet, Take 1 tablet (25 mg total) by mouth at bedtime., Disp: 90 tablet, Rfl: 0 Laboratory examination:   Lab Results  Component Value Date   NA 139 04/19/2022   K 3.5 04/19/2022   CO2 32 04/19/2022   GLUCOSE 108 (H) 04/19/2022   BUN 7 (L) 04/19/2022   CREATININE 0.47 04/19/2022   CALCIUM 9.3 04/19/2022   GFRNONAA >60 04/19/2022       Latest Ref Rng & Units 04/19/2022    1:44 AM 04/16/2022   12:23 PM 04/16/2022    5:58 AM  BMP  Glucose 70 - 99 mg/dL 108    83   BUN 8 - 23 mg/dL 7   <5  C  Creatinine 0.44 - 1.00 mg/dL 0.47   0.48   Sodium 135 - 145 mmol/L 139  137  139   Potassium 3.5 - 5.1 mmol/L 3.5  3.9  4.4   Chloride 98 - 111  mmol/L 98   100   CO2 22 - 32 mmol/L 32   32   Calcium 8.9 - 10.3 mg/dL 9.3   9.0     C Corrected result      Latest Ref Rng & Units 04/19/2022    1:44 AM 04/16/2022    5:58 AM 04/15/2022    3:20 PM  Hepatic Function  Total Protein 6.5 - 8.1 g/dL 5.7  5.7  6.0   Albumin 3.5 - 5.0 g/dL 2.5  2.5  2.9   AST 15 - 41 U/L 17  30  22    ALT 0 - 44 U/L 13  15  17    Alk Phosphatase 38 - 126 U/L 54  42  48   Total Bilirubin 0.3 - 1.2 mg/dL 0.2  0.6  0.8     Lipid Panel Recent Labs    02/02/22 0424 02/25/22 0341  CHOL 163  --   TRIG 25 84  LDLCALC 80  --   VLDL 5  --   HDL 78  --   CHOLHDL 2.1  --     HEMOGLOBIN A1C Lab Results  Component Value Date   HGBA1C 4.7 (L) 04/16/2022   MPG 88.19 04/16/2022   TSH Recent Labs    02/02/22 0447 04/15/22 1520  TSH 1.377 1.798    External labs:   11/22/2021 Total cholesterol 167, triglycerides 41, HDL 71, LDL 82, non-HDL 94. BUN 9, creatinine 0.57. eGFR >60. Sodium 138, potassium 3.7, chloride 90, bicarb 39, AST 19, ALT 13, alkaline phosphatase 65 A1c 5.8 BNP 14. Hemoglobin 13.7 g/dL, hematocrit 43.7% D-dimer 0.21   Radiology:   Lower EXTR venous duplex 03/05/2022: RIGHT: - There is no evidence of deep vein thrombosis in the lower extremity. However, portions of this examination were limited- see technologist comments above. Pulsatile waveforms suggestive of fluid overload   LEFT: - Findings consistent with acute deep vein thrombosis involving the TP Trunk. Pulsatile waveforms suggestive of fluid overload.  Repeat study 04/11/2022: Negative for sonographic evidence of DVT.  Chest x-ray 1 view 04/15/2022: Monitoring device projects over LEFT chest. Minimal enlargement of cardiac silhouette. Mediastinal contours and pulmonary vascularity  normal. Bibasilar atelectasis.  Cardiac Studies:   Cardiac event monitor 2020-02-23 Sinus rhythm Occasional premature ventricular contractions No AV block or pauses No sustained arrhythmias Baseline artifact limits interpretation at times  Echocardiogram 2022-02-22:  1. Left ventricular ejection fraction, by estimation, is 60 to 65%. The left ventricle has normal function. The left ventricle has no regional wall motion abnormalities. Left ventricular diastolic parameters are consistent with Grade I diastolic  dysfunction (impaired relaxation).   2. Right ventricular systolic function is normal. The right ventricular size is normal. Tricuspid regurgitation signal is inadequate for assessing PA pressure.   3. The mitral valve is grossly normal. No evidence of mitral valve regurgitation.   4. The aortic valve is tricuspid. Aortic valve regurgitation is not visualized.   5. The inferior vena cava is normal in size with greater than 50% respiratory variability, suggesting right atrial pressure of 3 mmHg.  Stress Testing: Lexiscan (Walking) Tetrofosmin stress test 01/17/2021: Walking/Lexiscan nuclear stress test. 2-day rest/stress protocol.  Small size, mild intensity perfusion defect, suggestive of possible reversible perfusion defect involving apical anterior and apex (overall accuracy is limited due to soft tissue attenuation BMI 43.6, images taken in seated position).  Without convincing evidence of prior myocardial infarction. Calculated LVEF 75%, visually appears hyperdynamic.   Left ventricular size upper limit of normal, wall thickness  preserved, and significant regional wall motion abnormalities. Clinical correlation required. No prior studies available for comparison. Low risk study.   EKG:   EKG 04/15/2022: Normal sinus rhythm at the rate of 84 bpm, normal axis, LVH.  Poor R wave progression, cannot exclude anteroseptal infarct old.  Diffuse nonspecific T wave flattening.    Assessment     ICD-10-CM   1. Dyspnea on exertion  G95.62 Basic metabolic panel    Pro b natriuretic peptide (BNP)    2. Primary hypertension  I10 olmesartan-hydrochlorothiazide (BENICAR HCT) 40-25 MG tablet    3. OSA (obstructive Russell apnea)  G47.33     4. Class 3 severe obesity due to excess calories without serious comorbidity with body mass index (BMI) of 40.0 to 44.9 in adult (HCC)  E66.01    Z68.41     5. History of deep venous thrombosis 03/05/22  Z86.718        Orders Placed This Encounter  Procedures   Basic metabolic panel   Pro b natriuretic peptide (BNP)    Meds ordered this encounter  Medications   olmesartan-hydrochlorothiazide (BENICAR HCT) 40-25 MG tablet    Sig: Take 1 tablet by mouth every morning.    Dispense:  30 tablet    Refill:  2    Discontinue HCTZ    Medications Discontinued During This Encounter  Medication Reason   Melatonin 10 MG TABS    ELIQUIS 5 MG TABS tablet Completed Course   hydrochlorothiazide (HYDRODIURIL) 25 MG tablet Change in therapy   potassium chloride 20 MEQ TBCR Discontinued by provider     Recommendations:   Tamara Russell is a 61 y.o.  African-American female patient with primary hypertension, morbid obesity, chronic diastolic heart failure, OSA not on CPAP, restrictive lung disease secondary to obesity and hypercarbic respiratory failure admitted to the hospital with altered mental status in July 2023, chronic lower extremity edema and right lower extremity venous insufficiency related to right common femoral vein reflux not amenable for endovascular therapy presents here for follow-up of shortness of breath, seen 3 months ago by [place].  On further review of her charts, she has been having mental status changes and ED evaluations since April of this year with hypoxemia, altered mental status, encephalopathy and agitation.  Patient over time states is back to herself, she is trying her best to lose weight, she is taking  all her medications as prescribed but does not know what they are for.  She is concerned that she has no cardiac issues.  I have reassured her.  Advised her that she may have mild chronic diastolic heart failure related to obesity, obesity hypoventilation and hypertension but can be easily be treated with continued weight loss and aggressive management of hypertension.  I will discontinue hydrochlorothiazide and switch her to olmesartan HCT 40/25 mg in the morning and discontinue potassium.  We will obtain BMP in 2 weeks to 3 weeks.  Encouraged her to reduce salt intake.  She has had DVT during this acute phase where she had altered mental status and other medical issues, repeat lower extremity venous duplex revealing complete resolution of left small peroneal vein DVT, hence Eliquis can be discontinued.  I would like to see her back in 6 weeks for follow-up.  This was a 40-minute office visit encounter.   Adrian Prows, MD, [facility] 07/25/2022, 5:50 PM Office: [phone]

## 2022-07-27 DIAGNOSIS — J9611 Chronic respiratory failure with hypoxia: Secondary | ICD-10-CM | POA: Diagnosis not present

## 2022-07-27 DIAGNOSIS — Z79899 Other long term (current) drug therapy: Secondary | ICD-10-CM | POA: Diagnosis not present

## 2022-07-27 DIAGNOSIS — N390 Urinary tract infection, site not specified: Secondary | ICD-10-CM | POA: Diagnosis not present

## 2022-07-27 DIAGNOSIS — I110 Hypertensive heart disease with heart failure: Secondary | ICD-10-CM | POA: Diagnosis not present

## 2022-07-27 DIAGNOSIS — G9341 Metabolic encephalopathy: Secondary | ICD-10-CM | POA: Diagnosis not present

## 2022-07-27 DIAGNOSIS — I5032 Chronic diastolic (congestive) heart failure: Secondary | ICD-10-CM | POA: Diagnosis not present

## 2022-07-27 DIAGNOSIS — I82401 Acute embolism and thrombosis of unspecified deep veins of right lower extremity: Secondary | ICD-10-CM | POA: Diagnosis not present

## 2022-07-27 DIAGNOSIS — J9612 Chronic respiratory failure with hypercapnia: Secondary | ICD-10-CM | POA: Diagnosis not present

## 2022-07-27 DIAGNOSIS — K219 Gastro-esophageal reflux disease without esophagitis: Secondary | ICD-10-CM | POA: Diagnosis not present

## 2022-07-27 DIAGNOSIS — R69 Illness, unspecified: Secondary | ICD-10-CM | POA: Diagnosis not present

## 2022-07-28 DIAGNOSIS — Z79899 Other long term (current) drug therapy: Secondary | ICD-10-CM | POA: Diagnosis not present

## 2022-07-28 DIAGNOSIS — N390 Urinary tract infection, site not specified: Secondary | ICD-10-CM | POA: Diagnosis not present

## 2022-07-28 DIAGNOSIS — J9611 Chronic respiratory failure with hypoxia: Secondary | ICD-10-CM | POA: Diagnosis not present

## 2022-07-28 DIAGNOSIS — I82401 Acute embolism and thrombosis of unspecified deep veins of right lower extremity: Secondary | ICD-10-CM | POA: Diagnosis not present

## 2022-07-28 DIAGNOSIS — R69 Illness, unspecified: Secondary | ICD-10-CM | POA: Diagnosis not present

## 2022-07-28 DIAGNOSIS — I110 Hypertensive heart disease with heart failure: Secondary | ICD-10-CM | POA: Diagnosis not present

## 2022-07-28 DIAGNOSIS — G9341 Metabolic encephalopathy: Secondary | ICD-10-CM | POA: Diagnosis not present

## 2022-07-28 DIAGNOSIS — J9612 Chronic respiratory failure with hypercapnia: Secondary | ICD-10-CM | POA: Diagnosis not present

## 2022-07-28 DIAGNOSIS — K219 Gastro-esophageal reflux disease without esophagitis: Secondary | ICD-10-CM | POA: Diagnosis not present

## 2022-07-28 DIAGNOSIS — I5032 Chronic diastolic (congestive) heart failure: Secondary | ICD-10-CM | POA: Diagnosis not present

## 2022-07-31 DIAGNOSIS — K219 Gastro-esophageal reflux disease without esophagitis: Secondary | ICD-10-CM | POA: Diagnosis not present

## 2022-07-31 DIAGNOSIS — R69 Illness, unspecified: Secondary | ICD-10-CM | POA: Diagnosis not present

## 2022-07-31 DIAGNOSIS — N390 Urinary tract infection, site not specified: Secondary | ICD-10-CM | POA: Diagnosis not present

## 2022-07-31 DIAGNOSIS — J9611 Chronic respiratory failure with hypoxia: Secondary | ICD-10-CM | POA: Diagnosis not present

## 2022-07-31 DIAGNOSIS — I82401 Acute embolism and thrombosis of unspecified deep veins of right lower extremity: Secondary | ICD-10-CM | POA: Diagnosis not present

## 2022-07-31 DIAGNOSIS — I5032 Chronic diastolic (congestive) heart failure: Secondary | ICD-10-CM | POA: Diagnosis not present

## 2022-07-31 DIAGNOSIS — I110 Hypertensive heart disease with heart failure: Secondary | ICD-10-CM | POA: Diagnosis not present

## 2022-07-31 DIAGNOSIS — G9341 Metabolic encephalopathy: Secondary | ICD-10-CM | POA: Diagnosis not present

## 2022-07-31 DIAGNOSIS — J9612 Chronic respiratory failure with hypercapnia: Secondary | ICD-10-CM | POA: Diagnosis not present

## 2022-07-31 DIAGNOSIS — Z79899 Other long term (current) drug therapy: Secondary | ICD-10-CM | POA: Diagnosis not present

## 2022-07-31 NOTE — Telephone Encounter (Signed)
Checked status on the portal it is still pending.  

## 2022-08-01 ENCOUNTER — Telehealth: Payer: Self-pay

## 2022-08-01 NOTE — Telephone Encounter (Signed)
Pt's husband called into the office stating patient needs an order sent to [employer] to have O2 cancled the number he left was [phone]

## 2022-08-04 DIAGNOSIS — I5033 Acute on chronic diastolic (congestive) heart failure: Secondary | ICD-10-CM | POA: Diagnosis not present

## 2022-08-04 DIAGNOSIS — I82409 Acute embolism and thrombosis of unspecified deep veins of unspecified lower extremity: Secondary | ICD-10-CM | POA: Diagnosis not present

## 2022-08-07 DIAGNOSIS — J9611 Chronic respiratory failure with hypoxia: Secondary | ICD-10-CM | POA: Diagnosis not present

## 2022-08-07 DIAGNOSIS — Z79899 Other long term (current) drug therapy: Secondary | ICD-10-CM | POA: Diagnosis not present

## 2022-08-07 DIAGNOSIS — K219 Gastro-esophageal reflux disease without esophagitis: Secondary | ICD-10-CM | POA: Diagnosis not present

## 2022-08-07 DIAGNOSIS — I82401 Acute embolism and thrombosis of unspecified deep veins of right lower extremity: Secondary | ICD-10-CM | POA: Diagnosis not present

## 2022-08-07 DIAGNOSIS — I110 Hypertensive heart disease with heart failure: Secondary | ICD-10-CM | POA: Diagnosis not present

## 2022-08-07 DIAGNOSIS — G9341 Metabolic encephalopathy: Secondary | ICD-10-CM | POA: Diagnosis not present

## 2022-08-07 DIAGNOSIS — R69 Illness, unspecified: Secondary | ICD-10-CM | POA: Diagnosis not present

## 2022-08-07 DIAGNOSIS — N390 Urinary tract infection, site not specified: Secondary | ICD-10-CM | POA: Diagnosis not present

## 2022-08-07 DIAGNOSIS — I5032 Chronic diastolic (congestive) heart failure: Secondary | ICD-10-CM | POA: Diagnosis not present

## 2022-08-07 DIAGNOSIS — J9612 Chronic respiratory failure with hypercapnia: Secondary | ICD-10-CM | POA: Diagnosis not present

## 2022-08-07 NOTE — Telephone Encounter (Signed)
Checked status it is still pending.  

## 2022-08-09 DIAGNOSIS — I10 Essential (primary) hypertension: Secondary | ICD-10-CM | POA: Diagnosis not present

## 2022-08-09 DIAGNOSIS — K922 Gastrointestinal hemorrhage, unspecified: Secondary | ICD-10-CM | POA: Diagnosis not present

## 2022-08-09 DIAGNOSIS — R4182 Altered mental status, unspecified: Secondary | ICD-10-CM | POA: Diagnosis not present

## 2022-08-09 DIAGNOSIS — R778 Other specified abnormalities of plasma proteins: Secondary | ICD-10-CM | POA: Diagnosis not present

## 2022-08-10 ENCOUNTER — Encounter: Payer: Self-pay | Admitting: Family Medicine

## 2022-08-10 ENCOUNTER — Ambulatory Visit (INDEPENDENT_AMBULATORY_CARE_PROVIDER_SITE_OTHER): Payer: 59 | Admitting: Family Medicine

## 2022-08-10 VITALS — BP 120/72 | HR 63 | Temp 98.3°F | Ht 61.0 in

## 2022-08-10 DIAGNOSIS — I10 Essential (primary) hypertension: Secondary | ICD-10-CM

## 2022-08-10 DIAGNOSIS — K644 Residual hemorrhoidal skin tags: Secondary | ICD-10-CM

## 2022-08-10 DIAGNOSIS — Z1231 Encounter for screening mammogram for malignant neoplasm of breast: Secondary | ICD-10-CM

## 2022-08-10 NOTE — Assessment & Plan Note (Addendum)
It does not appear to be inflamed at this time. I reassured the patient that there does not appear to be acute flare up of the area at this time. Patient would like a referral to have it removed. I will refer to GI for evaluation and possible need for colonoscopy.

## 2022-08-10 NOTE — Progress Notes (Signed)
Established Patient Office Visit  Subjective   Patient ID: Tamara Russell, female    DOB: 1960/12/17  Age: 61 y.o. MRN: [id number]  Chief Complaint  Patient presents with   Hemorrhoids    Patient complains of hemorrhoids since July 5th, tried high fiber diet with no relief    Patient is here for evaluation of her hemorrhoids. States she was given creams for it, no pain in her rectal area. States she takes iron supplements on a daily basis and it makes her feel constipated. Has been using juice at lunch.   Patient is here with her husband, husband states the eliquis was discontinued 2 weeks ago due to finishing treatment.   Husband reports that the swelling is much better than last month, still has some drop foot on the right but continues to work with physical therapy. States that her podiatrist recommended a boot to wear but states that it was very expensive.   Saw the lung doctor on 10/2, is still waiting on getting the sleep study done.    Current Outpatient Medications  Medication Instructions   acetaminophen (TYLENOL) 500 mg, Oral, Every 6 hours PRN, Reported on 04/21/2016   amLODipine (NORVASC) 10 mg, Oral, Daily   atorvastatin (LIPITOR) 40 mg, Oral, Daily   diphenhydramine-acetaminophen (TYLENOL PM) 25-500 MG TABS tablet 1 tablet, Oral, At bedtime PRN   ferrous sulfate 324 mg, Oral, Every morning   metoprolol succinate (TOPROL-XL) 25 mg, Oral, Daily   Multiple Vitamins-Minerals (CENTRUM SILVER ADULT 50+ PO) 1 tablet, Oral, Daily   olmesartan-hydrochlorothiazide (BENICAR HCT) 40-25 MG tablet 1 tablet, Oral, BH-each morning   QUEtiapine (SEROQUEL) 25 mg, Oral, Daily at bedtime     Patient Active Problem List   Diagnosis Date Noted   External hemorrhoids without complication 08/10/2022   Chronic venous insufficiency 07/04/2022   Localized swelling of right foot 06/27/2022   HLD (hyperlipidemia)    Chronic respiratory failure with hypoxia and hypercapnia (HCC)    Acute  encephalopathy 04/15/2022   DVT (deep venous thrombosis) (HCC) 03/05/2022   Aspiration pneumonia (HCC) 03/04/2022   Ileus (HCC) 03/04/2022   OSA (obstructive sleep apnea) 03/04/2022   Acute metabolic encephalopathy 02/06/2022   Chronic iron deficiency anemia 02/06/2022   Acute on chronic diastolic CHF (congestive heart failure) (HCC) 02/06/2022   Bradycardia    Sinus pause    Tachycardia    UTI (urinary tract infection)    Hypokalemia    Hypomagnesemia    Chronic heart failure with preserved ejection fraction (HFpEF) (HCC)    Elevated troponin level not due to acute coronary syndrome    Obesity, Class III, BMI 40-49.9 (morbid obesity) (HCC)    AMS (altered mental status) 02/02/2022   GI bleed 11/19/2018   Acute GI bleeding 11/17/2018   Hypertension    Acute blood loss anemia    Acute gastric ulcer with hemorrhage    PUD (peptic ulcer disease)    GERD (gastroesophageal reflux disease) 11/19/2015   AKI (acute kidney injury) (HCC) 11/19/2015   Acute bronchitis 11/19/2015      Review of Systems  All other systems reviewed and are negative.     Objective:     BP 120/72 (BP Location: Left Arm, Patient Position: Sitting, Cuff Size: Large)   Pulse 63   Temp 98.3 F (36.8 C) (Oral)   Ht 5\' 1"  (1.549 m)   SpO2 98%   BMI 35.14 kg/m    Physical Exam Vitals reviewed. Exam conducted with a chaperone present.  Constitutional:      Appearance: Normal appearance. She is well-groomed. She is obese.  Cardiovascular:     Rate and Rhythm: Normal rate and regular rhythm.     Pulses: Normal pulses.     Heart sounds: S1 normal and S2 normal.  Pulmonary:     Effort: Pulmonary effort is normal.     Breath sounds: Normal breath sounds and air entry.  Genitourinary:      Comments: Patient has a large external hemorrhoid in the 12 o'clock position, there is no tenderness to palpation, no inflammation or bleeding noted. Musculoskeletal:     Right lower leg: Edema (trace pitting in  the ankle) present.  Neurological:     Mental Status: She is alert and oriented to person, place, and time. Mental status is at baseline.     Gait: Gait is intact.  Psychiatric:        Mood and Affect: Mood and affect normal.        Speech: Speech normal.        Behavior: Behavior normal.      No results found for any visits on 08/10/22.    The 10-year ASCVD risk score (Arnett DK, et al., 2019) is: 5.1%    Assessment & Plan:   Problem List Items Addressed This Visit       Cardiovascular and Mediastinum   Hypertension    Reviewed the last note from her cardiologist, he switched her from HCTZ to olmesartan HCT. Today's blood pressure is very well controlled. I went over her BP medications with her and her husband extensively although there seemed to be some confusion.  Advised that they take the list to the pharmacist to make sure her pill packs are correct.  Current hypertension medications:       Sig   amLODipine (NORVASC) 10 MG tablet (Taking) Take 10 mg by mouth daily.   olmesartan-hydrochlorothiazide (BENICAR HCT) 40-25 MG tablet (Taking) Take 1 tablet by mouth every morning.   metoprolol succinate (TOPROL-XL) 25 MG 24 hr tablet Take 1 tablet (25 mg total) by mouth daily.            External hemorrhoids without complication (Chronic)    It does not appear to be inflamed at this time. I reassured the patient that there does not appear to be acute flare up of the area at this time. Patient would like a referral to have it removed. I will refer to GI for evaluation and possible need for colonoscopy.      Relevant Orders   Ambulatory referral to Gastroenterology   Other Visit Diagnoses     Breast cancer screening by mammogram    -  Primary   Relevant Orders   MM Digital Screening     I spent 35 minutes in the room with the patient today going over her medications and writing down instructions.   No follow-ups on file.    Farrel Conners, MD

## 2022-08-10 NOTE — Assessment & Plan Note (Addendum)
Reviewed the last note from her cardiologist, he switched her from HCTZ to olmesartan HCT. Today's blood pressure is very well controlled. I went over her BP medications with her and her husband extensively although there seemed to be some confusion.  Advised that they take the list to the pharmacist to make sure her pill packs are correct.  Current hypertension medications:      Sig   amLODipine (NORVASC) 10 MG tablet (Taking) Take 10 mg by mouth daily.   olmesartan-hydrochlorothiazide (BENICAR HCT) 40-25 MG tablet (Taking) Take 1 tablet by mouth every morning.   metoprolol succinate (TOPROL-XL) 25 MG 24 hr tablet Take 1 tablet (25 mg total) by mouth daily.

## 2022-08-14 ENCOUNTER — Ambulatory Visit (HOSPITAL_COMMUNITY): Payer: 59 | Admitting: Student in an Organized Health Care Education/Training Program

## 2022-08-14 NOTE — Telephone Encounter (Signed)
Patient husband called about scheduling patient SS, I called the patient husband back and left a vmail informing him that it is still pending with the insurance.

## 2022-08-14 NOTE — Telephone Encounter (Signed)
Checked status it is still pending.  

## 2022-08-15 DIAGNOSIS — J9611 Chronic respiratory failure with hypoxia: Secondary | ICD-10-CM | POA: Diagnosis not present

## 2022-08-15 DIAGNOSIS — I5032 Chronic diastolic (congestive) heart failure: Secondary | ICD-10-CM | POA: Diagnosis not present

## 2022-08-15 DIAGNOSIS — R69 Illness, unspecified: Secondary | ICD-10-CM | POA: Diagnosis not present

## 2022-08-15 DIAGNOSIS — I110 Hypertensive heart disease with heart failure: Secondary | ICD-10-CM | POA: Diagnosis not present

## 2022-08-15 DIAGNOSIS — I82401 Acute embolism and thrombosis of unspecified deep veins of right lower extremity: Secondary | ICD-10-CM | POA: Diagnosis not present

## 2022-08-15 DIAGNOSIS — K219 Gastro-esophageal reflux disease without esophagitis: Secondary | ICD-10-CM | POA: Diagnosis not present

## 2022-08-15 DIAGNOSIS — J9612 Chronic respiratory failure with hypercapnia: Secondary | ICD-10-CM | POA: Diagnosis not present

## 2022-08-15 DIAGNOSIS — Z79899 Other long term (current) drug therapy: Secondary | ICD-10-CM | POA: Diagnosis not present

## 2022-08-15 DIAGNOSIS — G9341 Metabolic encephalopathy: Secondary | ICD-10-CM | POA: Diagnosis not present

## 2022-08-15 DIAGNOSIS — N390 Urinary tract infection, site not specified: Secondary | ICD-10-CM | POA: Diagnosis not present

## 2022-08-17 ENCOUNTER — Encounter: Payer: Self-pay | Admitting: Nurse Practitioner

## 2022-08-17 ENCOUNTER — Ambulatory Visit (HOSPITAL_COMMUNITY): Payer: 59 | Admitting: Psychiatry

## 2022-08-20 DIAGNOSIS — E662 Morbid (severe) obesity with alveolar hypoventilation: Secondary | ICD-10-CM | POA: Diagnosis not present

## 2022-08-20 DIAGNOSIS — J984 Other disorders of lung: Secondary | ICD-10-CM | POA: Diagnosis not present

## 2022-08-23 ENCOUNTER — Telehealth: Payer: Self-pay | Admitting: Pulmonary Disease

## 2022-08-23 NOTE — Telephone Encounter (Signed)
Attempted to call patient and ask if she would continue seeing Dr. Vickey Huger or Dr. Wynona Neat. As I was doing authorization, Magnus Ivan stated that this patient already has an approved auth for a in lab sleep study submitted on 11/1 with neurology at Dr. Oliva Bustard office. I talked to Irving Burton at that office who said she would withdraw the auth in order for [place] to request one and get it scheduled. I've called the patient twice at home phone and husband's phone and was unable to get in touch with her.   Once I hear from the patient, I can let Dr. Oliva Bustard office know how she would like to proceed. If she is going to continue to see Dr. Wynona Neat, she can no longer see Dr. Vickey Huger.

## 2022-08-24 DIAGNOSIS — K219 Gastro-esophageal reflux disease without esophagitis: Secondary | ICD-10-CM | POA: Diagnosis not present

## 2022-08-24 DIAGNOSIS — I5032 Chronic diastolic (congestive) heart failure: Secondary | ICD-10-CM | POA: Diagnosis not present

## 2022-08-24 DIAGNOSIS — I82401 Acute embolism and thrombosis of unspecified deep veins of right lower extremity: Secondary | ICD-10-CM | POA: Diagnosis not present

## 2022-08-24 DIAGNOSIS — G9341 Metabolic encephalopathy: Secondary | ICD-10-CM | POA: Diagnosis not present

## 2022-08-24 DIAGNOSIS — J9611 Chronic respiratory failure with hypoxia: Secondary | ICD-10-CM | POA: Diagnosis not present

## 2022-08-24 DIAGNOSIS — N390 Urinary tract infection, site not specified: Secondary | ICD-10-CM | POA: Diagnosis not present

## 2022-08-24 DIAGNOSIS — R69 Illness, unspecified: Secondary | ICD-10-CM | POA: Diagnosis not present

## 2022-08-24 DIAGNOSIS — J9612 Chronic respiratory failure with hypercapnia: Secondary | ICD-10-CM | POA: Diagnosis not present

## 2022-08-24 DIAGNOSIS — I110 Hypertensive heart disease with heart failure: Secondary | ICD-10-CM | POA: Diagnosis not present

## 2022-08-24 DIAGNOSIS — Z79899 Other long term (current) drug therapy: Secondary | ICD-10-CM | POA: Diagnosis not present

## 2022-08-24 NOTE — Telephone Encounter (Signed)
Patient already has a in lab sleep study scheduled with Dr. Oliva Bustard office. She stated she no longer wants to see Dr. Wynona Neat

## 2022-08-24 NOTE — Telephone Encounter (Signed)
Split- [employer] medicare auth: [id number] (exp. 07/17/22 to 4/29/024).  Patient is scheduled at [facility] for 11/05/22 at 9 pm.  Mailed packet to the patient.

## 2022-08-25 ENCOUNTER — Telehealth: Payer: Self-pay | Admitting: Family Medicine

## 2022-08-25 DIAGNOSIS — G4733 Obstructive sleep apnea (adult) (pediatric): Secondary | ICD-10-CM

## 2022-08-25 DIAGNOSIS — J9611 Chronic respiratory failure with hypoxia: Secondary | ICD-10-CM

## 2022-08-25 NOTE — Telephone Encounter (Signed)
Ok to fax-

## 2022-08-25 NOTE — Telephone Encounter (Signed)
Patient requesting provider fax a request for DME company come pick up oxygen tank [employer] [phone]

## 2022-08-28 ENCOUNTER — Ambulatory Visit (INDEPENDENT_AMBULATORY_CARE_PROVIDER_SITE_OTHER): Payer: 59 | Admitting: Neurology

## 2022-08-28 ENCOUNTER — Encounter: Payer: Self-pay | Admitting: Neurology

## 2022-08-28 VITALS — BP 165/87 | HR 79 | Ht 59.0 in

## 2022-08-28 DIAGNOSIS — G9340 Encephalopathy, unspecified: Secondary | ICD-10-CM

## 2022-08-28 DIAGNOSIS — R4586 Emotional lability: Secondary | ICD-10-CM | POA: Diagnosis not present

## 2022-08-28 DIAGNOSIS — R69 Illness, unspecified: Secondary | ICD-10-CM | POA: Diagnosis not present

## 2022-08-28 NOTE — Telephone Encounter (Signed)
Order to discontinue O2 was entered under "DME other order" and a community message was sent to the DME team with [employer].  I left a detailed message at the patient's cell number with this information.

## 2022-08-28 NOTE — Addendum Note (Signed)
Addended by: Johnella Moloney on: 08/28/2022 08:13 AM   Modules accepted: Orders

## 2022-08-28 NOTE — Patient Instructions (Signed)
Follow-up with the sleep study as scheduled  Follow-up with psychiatry Continue your current medications Return as needed

## 2022-08-28 NOTE — Progress Notes (Signed)
[facility]  PATIENT: Tamara Russell DOB: August 18, 1961  REQUESTING CLINICIAN: Lucianne Lei, MD HISTORY FROM: Patient and husband  REASON FOR VISIT: Confusion, Depression, Encephalopathy   HISTORICAL  CHIEF COMPLAINT:  Chief Complaint  Patient presents with   Follow-up    Rm 13. Accompanied by husband. Follow up.   INTERVAL HISTORY 08/28/22 Patient presents today for follow-up, she is accompanied by her husband.  Since last visit they have follow-up with Dr. Brett Fairy and pending sleep study in January.  Husband reported her she is stable.  She is following all of her doctors and also has an appointment with psychiatry.  She still have episode of emotional liability, anger, cry.   HISTORY OF PRESENT ILLNESS:  This is a 61 year old woman past medical history of obesity, GERD, hypertension, who is presenting after a long hospitalization for encephalopathy.  While in the hospital patient had extensive work-up including MRI, LP, EEGs, neurology consulted and she even had treatment for IVIG for presumed encephalitis.  She continued to have confusion, but improved she was discharge to rehab first and now home.  Currently patient lives at home with husband and kids.  Husband reports that most of the time she dwells about the past and gets very emotional.  She is awake she is alert she is able to respond and communicate properly but again the majority of the time is spent remembering about the past, talking about her deceased grand mother and being very emotional.  Since leaving the hospital they have not set up care with psychiatry.  Denies any seizures, denies any previous history of psychiatric breakdown    Hospital Course (initial admission): Patient is a 61 y.o.  female morbid obesity, HTN, GERD-who was just discharged from this facility on 4/23-presented to the hospital on 4/24 with altered mental status.  Found to have metabolic encephalopathy.   Significant  events: 4/19-4/23>> hospitalization for chest pain, confusion - UTI. 4/24 Presented to the ED for confusion-admit to [facility]. 4/28 Neuro eval - marital stress possibly causing AMS - psych consult recommended 5/01 Episode of aspiration - bradycardia/worsening hypoxemia - improved after suctioning.CXR with bibasilar opacities-IV Ancef started. 5/04 Neuro re-eval per psych rec's-?autoimmune process causing AMS 5/05 Trial of Depakote per neurology. 5/06 Significant sedation / lethargy following Haldol/Ativan -concern for hypercarbia -BiPAP started. LP postponed. Haldol/Ativan dosage adjusted by psychiatry. 5/07 Intubated, eeg neg neuro consulted.  5/08 LP completed. Cortrak placed. 5/09 Extubated, continues to have agitated delirium with psychosis, and then episodes of minimal responsiveness 5/12 Intubated in setting SBO and suspected aspiration 5/15 Extubated, respiratory status improving and mental status clearing   05/16 transferred to [facility].    5/19 Completed antibiotic course the day before.  Started on IVIG. 5/23, 5 days IVIG completed.  5/24 was started on CPAP after first 3 nights of poor compliance, she has slowly started improving, now compliant,  still some waxing and waning mental status but overall better   06/01 patient medically stable, pending transfer to SNF to continue physical therapy.    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   Acute metabolic encephalopathy: Suspect multifactorial etiology-some residual delirium/encephalopathy from her most recent hospitalization (treated empirically with IVIG for autoimmune encephalitis)-and possible hypercarbia (discharged from SNF without  CPAP).  This  MD took care of this patient  during her most recent hospitalization-she was significantly confused/paranoid then-her overall mental status is much better but she continues to have episodes of some confusion/paranoia.  Patient to continue outpatient follow-up with neurology/psychiatry and her primary care practitioner.   OTHER MEDICAL CONDITIONS: Hypertension, hyperlipidemia, CAD,    REVIEW OF SYSTEMS: Full 14 system review of systems performed and negative with exception of: As noted in the HPI   ALLERGIES: Allergies  Allergen Reactions   Penicillins Other (See Comments)    Childhood allergy. Pt does not remember reaction Tolerated cefazolin 5/1-5/6, cefepime 5/12-5/14    HOME MEDICATIONS: Outpatient Medications Prior to Visit  Medication Sig Dispense Refill   acetaminophen (TYLENOL) 500 MG tablet Take 1 tablet (500 mg total) by mouth every 6 (six) hours as needed for moderate pain. Reported on 04/21/2016 (Patient taking differently: Take 1,000 mg by mouth every 6 (six) hours as needed for headache (pain).) 30 tablet 0   amLODipine (NORVASC) 10 MG tablet Take 10 mg by mouth daily.     atorvastatin (LIPITOR) 40 MG tablet Take 40 mg by mouth daily.     ferrous sulfate 324 MG TBEC Take 324 mg by mouth every morning.     Multiple Vitamins-Minerals (CENTRUM SILVER ADULT 50+ PO) Take 1 tablet by mouth daily.     olmesartan-hydrochlorothiazide (BENICAR HCT) 40-25 MG tablet Take 1 tablet by mouth every morning. 30 tablet 2   QUEtiapine (SEROQUEL) 25 MG tablet Take 1 tablet (25 mg total) by mouth at bedtime. 90 tablet 0   diphenhydramine-acetaminophen (TYLENOL PM) 25-500 MG TABS tablet Take 1 tablet by mouth at bedtime as needed (pain/sleep). (Patient not taking: Reported on 08/28/2022)     metoprolol succinate (TOPROL-XL) 25 MG 24 hr tablet Take 1 tablet (25 mg total) by mouth daily. 30 tablet 0   No facility-administered medications prior to visit.    PAST MEDICAL HISTORY: Past  Medical History:  Diagnosis Date   Acid reflux    Chronic diastolic heart failure (HCC)    Chronic respiratory failure with hypoxia and hypercapnia (HCC)    on 2 L [place] continuous   Complication of anesthesia    slow to wake up   DVT (deep venous thrombosis) (HCC)    of LLE ; dx'ed on 03/05/22   History of COVID-19    HLD (hyperlipidemia)    Hypertension     PAST SURGICAL HISTORY: Past Surgical History:  Procedure Laterality Date   ABDOMINAL HYSTERECTOMY     BIOPSY  11/17/2018   Procedure: BIOPSY;  Surgeon: Beverley Fiedler, MD;  Location: [facility] ENDOSCOPY;  Service: Gastroenterology;;   BREAST EXCISIONAL BIOPSY Left    benign   BREAST EXCISIONAL BIOPSY Right    benign   ESOPHAGOGASTRODUODENOSCOPY (EGD) WITH PROPOFOL N/A 11/17/2018   Procedure: ESOPHAGOGASTRODUODENOSCOPY (EGD) WITH PROPOFOL;  Surgeon: Beverley Fiedler, MD;  Location: [facility] ENDOSCOPY;  Service: Gastroenterology;  Laterality: N/A;    FAMILY HISTORY: Family History  Problem Relation Age of Onset   Cancer Father 71       stomach   Hypertension Father    Diabetes Maternal Grandmother    Colon cancer Maternal Grandfather    Diabetes Paternal Grandmother    Colon cancer Paternal Grandmother     SOCIAL HISTORY: Social History   Socioeconomic History   Marital status: Married    Spouse name: Not on file   Number of children: Not on file   Years of education: Not on file   Highest education level: Not on file  Occupational History   Occupation: unemployed  Tobacco Use   Smoking status: Former    Packs/day: 0.50    Years: 20.00    Total pack years: 10.00    Types: Cigarettes    Start date: 04/30/1975    Quit date: 04/30/1995    Years since quitting: 27.3   Smokeless tobacco: Never  Vaping Use   Vaping Use: Never used  Substance and Sexual Activity   Alcohol use: No    Alcohol/week: 0.0 standard drinks of alcohol   Drug use: No   Sexual activity: Not on file  Other Topics Concern   Not on file  Social History  Narrative   Not on file   Social Determinants of Health   Financial Resource Strain: Not on file  Food Insecurity: Not on file  Transportation Needs: Not on file  Physical Activity: Not on file  Stress: Not on file  Social Connections: Not on file  Intimate Partner Violence: Not on file    PHYSICAL EXAM  GENERAL EXAM/CONSTITUTIONAL: Vitals:  Vitals:   08/28/22 1444  BP: (!) 165/87  Pulse: 79  Height: 4\' 11"  (1.499 m)   Body mass index is 37.57 kg/m. Wt Readings from Last 3 Encounters:  07/25/22 186 lb (84.4 kg)  07/17/22 184 lb 12.8 oz (83.8 kg)  06/27/22 182 lb 3.2 oz (82.6 kg)   Patient is in no distress; well developed, nourished and groomed; neck is supple. Today, talking about Love and how the world is lacking love which is upsetting to her.    EYES: Pupils round and reactive to light, Visual fields full to confrontation, Extraocular movements intacts,   MUSCULOSKELETAL: Gait, strength, tone, movements noted in Neurologic exam below  NEUROLOGIC: MENTAL STATUS:      No data to display         awake, alert, oriented to person, place and time Tangential in her speech but redirectable  CRANIAL NERVE:  2nd, 3rd, 4th, 6th - pupils equal and reactive to light, visual fields full to confrontation, extraocular muscles intact, no nystagmus 5th - facial sensation symmetric 7th - facial strength symmetric 8th - hearing intact 9th - palate elevates symmetrically, uvula midline 11th - shoulder shrug symmetric 12th - tongue protrusion midline  MOTOR:  normal bulk and tone, at least antigravity in the BUE, BLE  SENSORY:  normal and symmetric to light touch  COORDINATION:  finger-nose-finger, fine finger movements normal  GAIT/STATION:  Walk with a walker    DIAGNOSTIC DATA (LABS, IMAGING, TESTING) - I reviewed patient records, labs, notes, testing and imaging myself where available.  Lab Results  Component Value Date   WBC 6.3 04/19/2022   HGB 11.4  (L) 04/19/2022   HCT 36.9 04/19/2022   MCV 78.8 (L) 04/19/2022   PLT 291 04/19/2022      Component Value Date/Time   NA 139 04/19/2022 0144   NA 140 03/05/2017 1449   K 3.5 04/19/2022 0144   CL 98 04/19/2022 0144   CO2 32 04/19/2022 0144   GLUCOSE 108 (H) 04/19/2022 0144   BUN 7 (L) 04/19/2022 0144   BUN 7 03/05/2017 1449   CREATININE 0.47 04/19/2022 0144   CREATININE 0.57 04/21/2016 1528   CALCIUM 9.3 04/19/2022 0144   PROT 5.7 (L) 04/19/2022 0144   ALBUMIN 2.5 (L) 04/19/2022 0144   AST 17 04/19/2022 0144   ALT 13 04/19/2022 0144   ALKPHOS 54 04/19/2022 0144   BILITOT 0.2 (L) 04/19/2022 0144   GFRNONAA >60 04/19/2022 0144  GFRNONAA >89 04/01/2015 1608   GFRAA >60 12/03/2019 2032   GFRAA >89 04/01/2015 1608   Lab Results  Component Value Date   CHOL 163 02/02/2022   HDL 78 02/02/2022   LDLCALC 80 02/02/2022   TRIG 84 02/25/2022   CHOLHDL 2.1 02/02/2022   Lab Results  Component Value Date   HGBA1C 4.7 (L) 04/16/2022   Lab Results  Component Value Date   VITAMINB12 815 04/16/2022   Lab Results  Component Value Date   TSH 1.798 04/15/2022    Head CT 04/12/22 No acute abnormality. Mild white matter changes consistent with chronic microvascular ischemia.  EEG 03/03/22 This study is suggestive of moderate diffuse encephalopathy, nonspecific etiology. No seizures or epileptiform discharges were seen throughout the recording.   ASSESSMENT AND PLAN  61 y.o. year old female with  obesity, GERD, hypertension who is presenting for follow up.  Overall her condition is improving but husband reports that she still have emotional liability, crying spells, and dwelling about the past.  They do have an appointment with psychiatry, strongly advised him to follow-up with psychiatry and set up care with a therapist.  Continue your other medications, continue to follow-up with your doctors including the sleep study scheduled in January and return as needed.  They voiced  understanding.    1. Encephalopathy   2. Emotional lability      Patient Instructions  Follow-up with the sleep study as scheduled  Follow-up with psychiatry Continue your current medications Return as needed  No orders of the defined types were placed in this encounter.   No orders of the defined types were placed in this encounter.   Return if symptoms worsen or fail to improve.   I have spent a total of 30 minutes dedicated to this patient today, preparing to see patient, performing a medically appropriate examination and evaluation, ordering tests and/or medications and procedures, and counseling and educating the patient/family/caregiver; independently interpreting result and communicating results to the family/patient/caregiver; and documenting clinical information in the electronic medical record.   Alric Ran, MD 08/28/2022, 9:48 PM  [facility] [address] [place], [place] [address] [phone]

## 2022-08-29 ENCOUNTER — Telehealth: Payer: Self-pay | Admitting: Family Medicine

## 2022-08-29 NOTE — Telephone Encounter (Signed)
Wanting to know if there is a specific weight parameter they should consider for the patient. Patient is losing weight

## 2022-08-30 DIAGNOSIS — G9341 Metabolic encephalopathy: Secondary | ICD-10-CM | POA: Diagnosis not present

## 2022-08-30 DIAGNOSIS — J9611 Chronic respiratory failure with hypoxia: Secondary | ICD-10-CM | POA: Diagnosis not present

## 2022-08-30 DIAGNOSIS — K219 Gastro-esophageal reflux disease without esophagitis: Secondary | ICD-10-CM | POA: Diagnosis not present

## 2022-08-30 DIAGNOSIS — I110 Hypertensive heart disease with heart failure: Secondary | ICD-10-CM | POA: Diagnosis not present

## 2022-08-30 DIAGNOSIS — N390 Urinary tract infection, site not specified: Secondary | ICD-10-CM | POA: Diagnosis not present

## 2022-08-30 DIAGNOSIS — I82401 Acute embolism and thrombosis of unspecified deep veins of right lower extremity: Secondary | ICD-10-CM | POA: Diagnosis not present

## 2022-08-30 DIAGNOSIS — R69 Illness, unspecified: Secondary | ICD-10-CM | POA: Diagnosis not present

## 2022-08-30 DIAGNOSIS — Z79899 Other long term (current) drug therapy: Secondary | ICD-10-CM | POA: Diagnosis not present

## 2022-08-30 DIAGNOSIS — I5032 Chronic diastolic (congestive) heart failure: Secondary | ICD-10-CM | POA: Diagnosis not present

## 2022-08-30 DIAGNOSIS — J9612 Chronic respiratory failure with hypercapnia: Secondary | ICD-10-CM | POA: Diagnosis not present

## 2022-08-30 NOTE — Telephone Encounter (Signed)
Pt's weight was 173 last week now 177. Also questioned patient's mental stability, states she is chatty and then gets very quiet, wonders if she has been diagnosed.

## 2022-08-31 ENCOUNTER — Telehealth: Payer: Self-pay | Admitting: Neurology

## 2022-08-31 ENCOUNTER — Telehealth: Payer: Self-pay | Admitting: Family Medicine

## 2022-08-31 NOTE — Telephone Encounter (Signed)
I don't have a particular weight but they should keep checking it and if it doesn't level off soon she should see me in the office.

## 2022-08-31 NOTE — Telephone Encounter (Signed)
Pt's spouse called stating that he would like to know if anything can be given to the pt to keep her calm. Please advise.

## 2022-08-31 NOTE — Telephone Encounter (Signed)
Pt's spouse called to ask MD if she could put in a referral for Pt to be tested for Dementia? As per Spouse, condition is getting progressively worse since the last visit.   At the [facility] meeting, all members agreed.  As per Family, she really needs to be evaluated ASAP!!  Please call Spouse back at  [phone]

## 2022-08-31 NOTE — Telephone Encounter (Signed)
The neurologist would be able to do this for the patient. Does she already have one?

## 2022-08-31 NOTE — Telephone Encounter (Signed)
Left a detailed message on Tamara Russell's voicemail with the advice below per PCP.

## 2022-08-31 NOTE — Telephone Encounter (Signed)
Spoke with Mr Grisanti and informed him of the message below.  Mr Reames was given the number to contact Dr Vickey Huger, the neurologist she has seen previously at [phone] for an appt.

## 2022-09-04 DIAGNOSIS — I82409 Acute embolism and thrombosis of unspecified deep veins of unspecified lower extremity: Secondary | ICD-10-CM | POA: Diagnosis not present

## 2022-09-04 DIAGNOSIS — I5033 Acute on chronic diastolic (congestive) heart failure: Secondary | ICD-10-CM | POA: Diagnosis not present

## 2022-09-04 NOTE — Telephone Encounter (Signed)
Patients husband called back and I advised him of Megan's message below to follow up with psychiatry regarding this medication. He stated patient has an appointment with them on Wednesday and they will ask about this.

## 2022-09-04 NOTE — Telephone Encounter (Signed)
After checking DPR, pt spouse was called and vm left advising him to reach out to pt's psychiatry re: anything to help keep pt calm. This is FYI to POD 2

## 2022-09-04 NOTE — Telephone Encounter (Signed)
Per recent office visit; pt has been advised to follow up with psychiatry. Please direct pt's husband to reach out to this office. Thank you.

## 2022-09-04 NOTE — Telephone Encounter (Signed)
Noted, thank you

## 2022-09-06 ENCOUNTER — Ambulatory Visit (HOSPITAL_BASED_OUTPATIENT_CLINIC_OR_DEPARTMENT_OTHER): Payer: 59 | Admitting: Psychiatry

## 2022-09-06 ENCOUNTER — Ambulatory Visit: Payer: 59 | Admitting: Cardiology

## 2022-09-06 ENCOUNTER — Encounter (HOSPITAL_COMMUNITY): Payer: Self-pay | Admitting: Psychiatry

## 2022-09-06 ENCOUNTER — Other Ambulatory Visit: Payer: Self-pay | Admitting: Family Medicine

## 2022-09-06 VITALS — BP 148/89 | HR 79 | Temp 97.5°F | Ht 59.0 in | Wt 177.2 lb

## 2022-09-06 DIAGNOSIS — F99 Mental disorder, not otherwise specified: Secondary | ICD-10-CM | POA: Diagnosis not present

## 2022-09-06 DIAGNOSIS — F5105 Insomnia due to other mental disorder: Secondary | ICD-10-CM | POA: Diagnosis not present

## 2022-09-06 DIAGNOSIS — G9340 Encephalopathy, unspecified: Secondary | ICD-10-CM

## 2022-09-06 MED ORDER — QUETIAPINE FUMARATE 50 MG PO TABS: 50.0000 mg | ORAL_TABLET | Freq: Two times a day (BID) | ORAL | 1 refills | Status: DC

## 2022-09-06 NOTE — Progress Notes (Signed)
Psychiatric Initial Adult Assessment   Patient Identification: Tamara Russell MRN:  [id number] Date of Evaluation:  09/06/2022 Referral Source: PCP Chief Complaint:   Chief Complaint  Patient presents with   Establish Care   Altered Mental Status   Visit Diagnosis:    ICD-10-CM   1. Acute encephalopathy  G93.40 Urinalysis    CBC with Differential    QUEtiapine (SEROQUEL) 50 MG tablet    2. Insomnia due to other mental disorder  F51.05 Urinalysis   F99 CBC with Differential    QUEtiapine (SEROQUEL) 50 MG tablet       Assessment:  Tamara Russell is a 61 y.o. female with a history of OSA, hospitalization in April-May of 2023 for acute encephalopathy and delirium who presents in person to [facility] at [facility] for initial evaluation on 09/06/2022.    Patient presents as disorganized, with mood lability, tangential thought process, pressured speech, and elevated volume.  There appears to be waxing and waning in her level of attention/concentration.  Often times patient was illogical and she endorsed questionable delusional/paranoid thoughts.  Of note patient carries no previous psychiatric diagnoses and has past history significant for multiple month medical hospitalization for acute encephalopathy.  Patient had workup including MRI, LP, EEG over the course of her hospitalization addition to receiving IVIG treatment.  Patient eventually improved and had been discharged to rehab followed by transition back home. New onset psychosis in a pt in her 76s absent psychiatric history is unlikely to be 2/2 a primary psychotic spectrum diagnosis.  Patient would benefit from UA and CBC to rule out potential infection.  She would also benefit from continuing to follow neurology and sleep study.  Will increase Seroquel to 50 mg twice a day to help manage patient's mood lability symptoms.   Plan: - Increase Seroquel 50 mg BID - CMP, CBC, Vit B12, folate, A1c  reviewed - ordered UA and CBC - Sleep study scheduled January 8 or 21 - Crisis resources reviewed - Can consider counseling in the future - Follow up in  History of Present Illness:  Tamara Russell presents today accompanied by her husband.  She was labile and tangential during the meeting which made interview more difficult.  Patient reports that she is here as her husband forced her to come.  She does not believe that she needs to be seeing a psychiatrist and that there is anything wrong.  On exploration of what has been going on Tamara Russell reports that she has been having increased disagreements with her husband since January 2023.  Patient notes that the 2 of them are constantly arguing and unable to get along.  Patient reports that it has reached the point that she wants a divorce and him to move out of the house.  She feels like that the amount of stress in the house is part of the issue.  She currently lives with her husband, 47 year old son, and her husband's 2 kids who are around 24 and 37.  Due to the increased stress patient has been staying with her daughter and son-in-law several days a week. Tamara Russell reported multiple times that she wanted to go through either marriage counseling or get a divorce.  She also endorsed plans to go back to work and move to [place], [place], or [place].  When questioned about the financial viability or practical nature of these plans patient was unable to respond with a logical answer.  Patient's husband reports that the symptoms began around the  time of her hospitalization in April 2023.  He notes that she has been more irritable, depressed, and anxious with rapid mood lability.  He also notes that she can make bizarre or inaccurate statements at times, such as statements about him having a fair or him abusing her.  We explored this and patient admits that he has had a couple of affairs during their 45-year long marriage.  One of the affairs did result in a couple of kids which  the 2 of them now have custody of due to the mother being a poor caretaker.  Her husband reports that after the affairs Tamara Russell did not seem to have a change in mood or be upset in the relationship.  It was not until after this recent hospitalization that the past affairs become such a huge issue.  He is not sure if it is because she never processed the affairs in the past, but he does not feel it is realistic for her to divorce him and live on her own at this point.  He notes that he has concerns about her ability to care for himself if she was on her own.  We discussed patient's current presentation (labile mood, tangential, elevated and pressured speech) and prior hospitalization earlier this year.  Patient was able to recall pieces of the hospitalization which she found traumatic, but was unable to report why she was in the hospital.  As for her current presentation patient reports that this is only because she is with her husband and he upsets her.  She notes that if she was accompanied by her daughter she would have stable mood and not be as disorganized.  We discussed the importance of ruling out potential UTI or infection that could be causing patient's current presentation.  Based on husband's report and past records it does not appear that her current presentation is her baseline.  She had been noted to have delirium and encephalitis while hospitalized secondary to infection.  We also discussed increasing the Seroquel to 50 mg twice a day which patient was hesitant with but ultimately did agree.  Risk and benefits of this medication were discussed.  Patient was agreeable to proceeding with her sleep study and following up with this provider in a month.  She plans to attend the appointment alongside her daughter to show that her mood lability and disorganization were related to her husband being present.  Associated Signs/Symptoms: Depression Symptoms:  fatigue, anxiety, weight loss, (Hypo) Manic  Symptoms:  Distractibility, Impulsivity, Irritable Mood, Labiality of Mood, Anxiety Symptoms:  Excessive Worry, Psychotic Symptoms:   Possible delusional/paranoid belief that her husband is having an affair PTSD Symptoms: NA  Past Psychiatric History: Patient had no prior psychiatric history up until her hospitalization in April to June of 2023.  At which time she was assessed by the psychiatric consult team for acute delirium/encephalitis.  Patient had been trialed on Zyprexa, Haldol, Geodon, and Seroquel during that time.  She was discharged on Seroquel.  Patient denies any prior suicide attempts or psychiatric hospitalizations.  Has tried  Seroquel, Geodon, Zyprexa, haldol  Patient denies any substance use.  Previous Psychotropic Medications: Yes   Substance Abuse History in the last 12 months:  No.  Consequences of Substance Abuse: NA  Past Medical History:  Past Medical History:  Diagnosis Date   Acid reflux    Chronic diastolic heart failure (HCC)    Chronic respiratory failure with hypoxia and hypercapnia (HCC)    on 2 L  [place] continuous   Complication of anesthesia    slow to wake up   DVT (deep venous thrombosis) (HCC)    of LLE ; dx'ed on 03/05/22   History of COVID-19    HLD (hyperlipidemia)    Hypertension     Past Surgical History:  Procedure Laterality Date   ABDOMINAL HYSTERECTOMY     BIOPSY  11/17/2018   Procedure: BIOPSY;  Surgeon: Beverley Fiedler, MD;  Location: [facility] ENDOSCOPY;  Service: Gastroenterology;;   BREAST EXCISIONAL BIOPSY Left    benign   BREAST EXCISIONAL BIOPSY Right    benign   ESOPHAGOGASTRODUODENOSCOPY (EGD) WITH PROPOFOL N/A 11/17/2018   Procedure: ESOPHAGOGASTRODUODENOSCOPY (EGD) WITH PROPOFOL;  Surgeon: Beverley Fiedler, MD;  Location: [facility] ENDOSCOPY;  Service: Gastroenterology;  Laterality: N/A;    Family Psychiatric History: Denies  Family History:  Family History  Problem Relation Age of Onset   Cancer Father 41       stomach    Hypertension Father    Diabetes Maternal Grandmother    Colon cancer Maternal Grandfather    Diabetes Paternal Grandmother    Colon cancer Paternal Grandmother     Social History:   Social History   Socioeconomic History   Marital status: Married    Spouse name: Not on file   Number of children: Not on file   Years of education: Not on file   Highest education level: Not on file  Occupational History   Occupation: unemployed  Tobacco Use   Smoking status: Former    Packs/day: 0.50    Years: 20.00    Total pack years: 10.00    Types: Cigarettes    Start date: 04/30/1975    Quit date: 04/30/1995    Years since quitting: 27.3   Smokeless tobacco: Never  Vaping Use   Vaping Use: Never used  Substance and Sexual Activity   Alcohol use: No    Alcohol/week: 0.0 standard drinks of alcohol   Drug use: No   Sexual activity: Not on file  Other Topics Concern   Not on file  Social History Narrative   Not on file   Social Determinants of Health   Financial Resource Strain: Not on file  Food Insecurity: Not on file  Transportation Needs: Not on file  Physical Activity: Not on file  Stress: Not on file  Social Connections: Not on file    Additional Social History: Patient lives with her husband of 45 years.  Her 93 year old son also lives with them following an accident.  They are also raising her husband's 2 kids from another woman who he has custody of.  The kids are 13 and 9.  Patient has an older daughter who lives in the area and is married.  She will stay with them a few days a week.  Patient's retired and had worked at [employer] in the past.  Allergies:   Allergies  Allergen Reactions   Penicillins Other (See Comments)    Childhood allergy. Pt does not remember reaction Tolerated cefazolin 5/1-5/6, cefepime 5/12-5/14    Metabolic Disorder Labs: Lab Results  Component Value Date   HGBA1C 4.7 (L) 04/16/2022   MPG 88.19 04/16/2022   MPG 114 04/21/2016   No results  found for: "PROLACTIN" Lab Results  Component Value Date   CHOL 163 02/02/2022   TRIG 84 02/25/2022   HDL 78 02/02/2022   CHOLHDL 2.1 02/02/2022   VLDL 5 02/02/2022   LDLCALC 80 02/02/2022   LDLCALC 87 10/13/2016  Lab Results  Component Value Date   TSH 1.798 04/15/2022    Therapeutic Level Labs: No results found for: "LITHIUM" No results found for: "CBMZ" Lab Results  Component Value Date   VALPROATE 39 (L) 02/18/2022    Current Medications: Current Outpatient Medications  Medication Sig Dispense Refill   acetaminophen (TYLENOL) 500 MG tablet Take 1 tablet (500 mg total) by mouth every 6 (six) hours as needed for moderate pain. Reported on 04/21/2016 (Patient taking differently: Take 1,000 mg by mouth every 6 (six) hours as needed for headache (pain).) 30 tablet 0   amLODipine (NORVASC) 10 MG tablet Take 10 mg by mouth daily.     atorvastatin (LIPITOR) 40 MG tablet Take 40 mg by mouth daily.     diphenhydramine-acetaminophen (TYLENOL PM) 25-500 MG TABS tablet Take 1 tablet by mouth at bedtime as needed (pain/sleep).     ferrous sulfate 324 MG TBEC Take 324 mg by mouth every morning.     Multiple Vitamins-Minerals (CENTRUM SILVER ADULT 50+ PO) Take 1 tablet by mouth daily.     olmesartan-hydrochlorothiazide (BENICAR HCT) 40-25 MG tablet Take 1 tablet by mouth every morning. 30 tablet 2   QUEtiapine (SEROQUEL) 50 MG tablet Take 1 tablet (50 mg total) by mouth 2 (two) times daily. 60 tablet 1   metoprolol succinate (TOPROL-XL) 25 MG 24 hr tablet Take 1 tablet (25 mg total) by mouth daily. 30 tablet 0   No current facility-administered medications for this visit.    Musculoskeletal: Strength & Muscle Tone: decreased Gait & Station: unsteady, shuffle Patient leans: Front and walked hunched over walker  Psychiatric Specialty Exam: Review of Systems  Blood pressure (!) 148/89, pulse 79, temperature (!) 97.5 F (36.4 C), temperature source Oral, height  (1.499 m), weight  177 lb 3.2 oz (80.4 kg).Body mass index is 35.79 kg/m.  General Appearance: Casual and Disheveled  Eye Contact:  Fair  Speech:  Pressured  Volume:  Increased  Mood:  Anxious, Euthymic, and Irritable  Affect:  Labile, Full Range, and Tearful  Thought Process:  Disorganized and Descriptions of Associations: Tangential  Orientation:  Other:  Oriented to person and place not time  Thought Content:  Illogical and Tangential  Suicidal Thoughts:  No  Homicidal Thoughts:  No  Memory:  Immediate;   Fair Recent;   Poor Remote;   Fair  Judgement:  Impaired  Insight:  Lacking  Psychomotor Activity:  Shuffling Gait  Concentration:  Concentration: Fair  Recall:  [employer] of Knowledge:Poor  Language: Fair  Akathisia:  No    AIMS (if indicated):  not done  Assets:  Housing Social Support  ADL's:  Impaired  Cognition: Impaired,  Mild  Sleep:  Fair   Screenings: GAD-7    Garment/textile technologist Visit from 03/30/2017 in [facility] Office Visit from 12/28/2016 in [facility] Office Visit from 10/02/2016 in [facility] Office Visit from 04/21/2016 in [facility]  Total GAD-7 Score 0 0 0 0      PHQ2-9    Flowsheet Row Office Visit from 08/10/2022 in [facility] Office Visit from 06/27/2022 in [facility] [employer] from 03/30/2017 in [facility] Office Visit from 03/05/2017 in [facility] Office Visit from 12/28/2016 in [facility]  PHQ-2 Total Score 3 5 0 0 0  PHQ-9 Total Score 9 14 0 1 0      Flowsheet Row ED to Hosp-Admission (Discharged) from 04/15/2022 in [facility] ED from 04/12/2022 in [facility] ED to Hosp-Admission (Discharged) from 02/06/2022 in [facility]  C-SSRS  RISK CATEGORY No Risk No Risk No Risk        Collaboration of Care: Medication Management AEB medication prescription, Primary Care Provider AEB chart review, Psychiatrist AEB consult review, and Other provider involved in patient's care AEB chart review  Patient/Guardian was advised Release of Information must be obtained prior to any record release in order to collaborate their care with an outside provider. Patient/Guardian was advised if they have not already done so to contact the registration department to sign all necessary forms in order for [place] to release information regarding their care.   Consent: Patient/Guardian gives verbal consent for treatment and assignment of benefits for services provided during this visit. Patient/Guardian expressed understanding and agreed to proceed.   Stasia Cavalier, MD 11/22/20234:02 PM

## 2022-09-09 DIAGNOSIS — K922 Gastrointestinal hemorrhage, unspecified: Secondary | ICD-10-CM | POA: Diagnosis not present

## 2022-09-09 DIAGNOSIS — I10 Essential (primary) hypertension: Secondary | ICD-10-CM | POA: Diagnosis not present

## 2022-09-09 DIAGNOSIS — R778 Other specified abnormalities of plasma proteins: Secondary | ICD-10-CM | POA: Diagnosis not present

## 2022-09-09 DIAGNOSIS — R4182 Altered mental status, unspecified: Secondary | ICD-10-CM | POA: Diagnosis not present

## 2022-09-11 ENCOUNTER — Ambulatory Visit: Payer: 59 | Admitting: Cardiology

## 2022-09-11 ENCOUNTER — Encounter: Payer: Self-pay | Admitting: Cardiology

## 2022-09-11 VITALS — BP 140/80 | HR 81 | Resp 16 | Ht 59.0 in | Wt 180.8 lb

## 2022-09-11 DIAGNOSIS — M7989 Other specified soft tissue disorders: Secondary | ICD-10-CM

## 2022-09-11 DIAGNOSIS — I5032 Chronic diastolic (congestive) heart failure: Secondary | ICD-10-CM

## 2022-09-11 DIAGNOSIS — I10 Essential (primary) hypertension: Secondary | ICD-10-CM | POA: Diagnosis not present

## 2022-09-11 DIAGNOSIS — R0609 Other forms of dyspnea: Secondary | ICD-10-CM

## 2022-09-11 NOTE — Progress Notes (Signed)
Primary Physician/Referring:  Farrel Conners, MD  Patient ID: Percell Belt, female    DOB: 03/17/1961, 61 y.o.   MRN: [id number]  Chief Complaint  Patient presents with   Shortness of Breath   Hypertension   Follow-up    6 weeks   HPI:    Manuella Blackson  is a 61 y.o. African-American female patient with primary hypertension, morbid obesity, chronic diastolic heart failure, OSA not on CPAP, restrictive lung disease secondary to obesity and chronic hypercarbic respiratory failure secondary to pickwickian syndrome, chronic lower extremity edema and right lower extremity venous insufficiency related to right common femoral vein reflux not amenable for endovascular therapy.  Admitted in April 2023 with altered mental status, severe hypoxemic and hypercarbic respiratory failure.  Also developed DVT at that time.  Patient seen 4-5 weeks ago for hypertension management, management of chronic diastolic heart failure and dyspnea in which I discontinued hydrochlorothiazide and switch her to olmesartan HCT.  I had also discontinued Eliquis as a DVT had resolved.  She now presents for follow-up.  States that she is feeling the best she has in quite a while, dyspnea is improved with weight loss, no PND or orthopnea, she has very minimal leg edema since especially weight loss.  Past Medical History:  Diagnosis Date   Acid reflux    Chronic diastolic heart failure (HCC)    Chronic respiratory failure with hypoxia and hypercapnia (HCC)    on 2 L [place] continuous   Complication of anesthesia    slow to wake up   DVT (deep venous thrombosis) (HCC)    of LLE ; dx'ed on 03/05/22   History of COVID-19    HLD (hyperlipidemia)    Hypertension    Past Surgical History:  Procedure Laterality Date   ABDOMINAL HYSTERECTOMY     BIOPSY  11/17/2018   Procedure: BIOPSY;  Surgeon: Jerene Bears, MD;  Location: [facility];  Service: Gastroenterology;;   BREAST EXCISIONAL BIOPSY Left    benign    BREAST EXCISIONAL BIOPSY Right    benign   ESOPHAGOGASTRODUODENOSCOPY (EGD) WITH PROPOFOL N/A 11/17/2018   Procedure: ESOPHAGOGASTRODUODENOSCOPY (EGD) WITH PROPOFOL;  Surgeon: Jerene Bears, MD;  Location: [place];  Service: Gastroenterology;  Laterality: N/A;   Family History  Problem Relation Age of Onset   Cancer Father 48       stomach   Hypertension Father    Diabetes Maternal Grandmother    Colon cancer Maternal Grandfather    Diabetes Paternal Grandmother    Colon cancer Paternal Grandmother     Social History   Tobacco Use   Smoking status: Former    Packs/day: 0.50    Years: 20.00    Total pack years: 10.00    Types: Cigarettes    Start date: 04/30/1975    Quit date: 04/30/1995    Years since quitting: 27.3   Smokeless tobacco: Never  Substance Use Topics   Alcohol use: No    Alcohol/week: 0.0 standard drinks of alcohol   Marital Status: Married  ROS  Review of Systems  Constitutional: Positive for weight loss (Intentional).  Cardiovascular:  Positive for dyspnea on exertion. Negative for chest pain, leg swelling and orthopnea.   Objective      09/11/2022    2:14 PM 09/11/2022    1:56 PM 09/06/2022    3:36 PM  Vitals with BMI  Height  _0    Weight  180 lbs 13 oz   BMI  36.5  Systolic 016 553   Diastolic 80 83   Pulse  81      Information is confidential and restricted. Go to Review Flowsheets to unlock data.   Today's Vitals   09/11/22 1356 09/11/22 1414  BP: (!) 154/83 (!) 140/80  Pulse: 81   Resp: 16   Weight: 180 lb 12.8 oz (82 kg)   Height: _0  (1.499 m)    Body mass index is 36.52 kg/m.  Physical Exam Constitutional:      Appearance: She is obese.  Neck:     Vascular: No carotid bruit or JVD.  Cardiovascular:     Rate and Rhythm: Normal rate and regular rhythm.     Pulses: Normal pulses and intact distal pulses.     Heart sounds: Normal heart sounds. No murmur heard.    No gallop.  Pulmonary:     Effort: Pulmonary  effort is normal.     Breath sounds: Normal breath sounds.  Abdominal:     General: Bowel sounds are normal.     Palpations: Abdomen is soft.     Comments: Obese. Pannus present  Musculoskeletal:     Right lower leg: No edema.     Left lower leg: No edema.    Medications and allergies   Allergies  Allergen Reactions   Penicillins Other (See Comments)    Childhood allergy. Pt does not remember reaction Tolerated cefazolin 5/1-5/6, cefepime 5/12-5/14    Medication list after today's encounter   Current Outpatient Medications:    acetaminophen (TYLENOL) 500 MG tablet, Take 1 tablet (500 mg total) by mouth every 6 (six) hours as needed for moderate pain. Reported on 04/21/2016 (Patient taking differently: Take 1,000 mg by mouth every 6 (six) hours as needed for headache (pain).), Disp: 30 tablet, Rfl: 0   amLODipine (NORVASC) 10 MG tablet, Take 10 mg by mouth daily., Disp: , Rfl:    atorvastatin (LIPITOR) 40 MG tablet, Take 40 mg by mouth daily., Disp: , Rfl:    ferrous sulfate 324 MG TBEC, Take 324 mg by mouth every morning., Disp: , Rfl:    metoprolol succinate (TOPROL-XL) 25 MG 24 hr tablet, Take 1 tablet (25 mg total) by mouth daily., Disp: 30 tablet, Rfl: 0   Multiple Vitamins-Minerals (CENTRUM SILVER ADULT 50+ PO), Take 1 tablet by mouth daily., Disp: , Rfl:    olmesartan-hydrochlorothiazide (BENICAR HCT) 40-25 MG tablet, Take 1 tablet by mouth every morning., Disp: 30 tablet, Rfl: 2   QUEtiapine (SEROQUEL) 50 MG tablet, Take 1 tablet (50 mg total) by mouth 2 (two) times daily. (Patient taking differently: Take 25 mg by mouth every evening.), Disp: 60 tablet, Rfl: 1 Laboratory examination:   Lab Results  Component Value Date   NA 139 04/19/2022   K 3.5 04/19/2022   CO2 32 04/19/2022   GLUCOSE 108 (H) 04/19/2022   BUN 7 (L) 04/19/2022   CREATININE 0.47 04/19/2022   CALCIUM 9.3 04/19/2022   GFRNONAA >60 04/19/2022       Latest Ref Rng & Units 04/19/2022    1:44 AM 04/16/2022    12:23 PM 04/16/2022    5:58 AM  BMP  Glucose 70 - 99 mg/dL 108   83   BUN 8 - 23 mg/dL 7   <5  C  Creatinine 0.44 - 1.00 mg/dL 0.47   0.48   Sodium 135 - 145 mmol/L 139  137  139   Potassium 3.5 - 5.1 mmol/L 3.5  3.9  4.4  Chloride 98 - 111 mmol/L 98   100   CO2 22 - 32 mmol/L 32   32   Calcium 8.9 - 10.3 mg/dL 9.3   9.0     C Corrected result      Latest Ref Rng & Units 04/19/2022    1:44 AM 04/16/2022    5:58 AM 04/15/2022    3:20 PM  Hepatic Function  Total Protein 6.5 - 8.1 g/dL 5.7  5.7  6.0   Albumin 3.5 - 5.0 g/dL 2.5  2.5  2.9   AST 15 - 41 U/L _0 ALT 0 - 44 U/L _1 Alk Phosphatase 38 - 126 U/L 54  42  48   Total Bilirubin 0.3 - 1.2 mg/dL 0.2  0.6  0.8     Lipid Panel Recent Labs    02/02/22 0424 02/25/22 0341  CHOL 163  --   TRIG 25 84  LDLCALC 80  --   VLDL 5  --   HDL 78  --   CHOLHDL 2.1  --    HEMOGLOBIN A1C Lab Results  Component Value Date   HGBA1C 4.7 (L) 04/16/2022   MPG 88.19 04/16/2022   TSH Recent Labs    02/02/22 0447 04/15/22 1520  TSH 1.377 1.798   External labs:   11/22/2021 Total cholesterol 167, triglycerides 41, HDL 71, LDL 82, non-HDL 94. BUN 9, creatinine 0.57. eGFR >60. Sodium 138, potassium 3.7, chloride 90, bicarb 39, AST 19, ALT 13, alkaline phosphatase 65 A1c 5.8 BNP 14. Hemoglobin 13.7 g/dL, hematocrit 43.7% D-dimer 0.21   Radiology:   Lower EXTR venous duplex 03/05/2022: RIGHT: - There is no evidence of deep vein thrombosis in the lower extremity. However, portions of this examination were limited- see technologist comments above. Pulsatile waveforms suggestive of fluid overload   LEFT: - Findings consistent with acute deep vein thrombosis involving the TP Trunk. Pulsatile waveforms suggestive of fluid overload.  Repeat study 04/11/2022: Negative for sonographic evidence of DVT.  Chest x-ray 1 view 04/15/2022: Monitoring device projects over LEFT chest. Minimal enlargement of cardiac  silhouette. Mediastinal contours and pulmonary vascularity normal. Bibasilar atelectasis.  Cardiac Studies:   Cardiac event monitor 02/14/20 Sinus rhythm Occasional premature ventricular contractions No AV block or pauses No sustained arrhythmias Baseline artifact limits interpretation at times  Echocardiogram Feb 13, 2022:  1. Left ventricular ejection fraction, by estimation, is 60 to 65%. The left ventricle has normal function. The left ventricle has no regional wall motion abnormalities. Left ventricular diastolic parameters are consistent with Grade I diastolic  dysfunction (impaired relaxation).   2. Right ventricular systolic function is normal. The right ventricular size is normal. Tricuspid regurgitation signal is inadequate for assessing PA pressure.   3. The mitral valve is grossly normal. No evidence of mitral valve regurgitation.   4. The aortic valve is tricuspid. Aortic valve regurgitation is not visualized.   5. The inferior vena cava is normal in size with greater than 50% respiratory variability, suggesting right atrial pressure of 3 mmHg.  Stress Testing: Lexiscan (Walking) Tetrofosmin stress test 01/17/2021: Walking/Lexiscan nuclear stress test. 2-day rest/stress protocol.  Small size, mild intensity perfusion defect, suggestive of possible reversible perfusion defect involving apical anterior and apex (overall accuracy is limited due to soft tissue attenuation BMI 43.6, images taken in seated position).  Without convincing evidence of prior myocardial infarction. Calculated LVEF 75%, visually appears hyperdynamic.   Left ventricular size upper limit of normal,  wall thickness preserved, and significant regional wall motion abnormalities. Clinical correlation required. No prior studies available for comparison. Low risk study.   EKG:   EKG 08/22/2022: Normal sinus rhythm at the rate of 78 bpm, left atrial enlargement, normal axis.  No evidence of ischemia, normal EKG.   No significant change from 11/21/2021.  Assessment     ICD-10-CM   1. Primary hypertension  I10     2. Chronic diastolic (congestive) heart failure (HCC)  I50.32 EKG 12-Lead    3. Dyspnea on exertion  R06.09     4. Leg swelling  M79.89        Orders Placed This Encounter  Procedures   EKG 12-Lead    No orders of the defined types were placed in this encounter.   Medications Discontinued During This Encounter  Medication Reason   diphenhydramine-acetaminophen (TYLENOL PM) 25-500 MG TABS tablet      Recommendations:   Ammi Hutt is a 61 y.o.  African-American female patient with primary hypertension, morbid obesity, chronic diastolic heart failure, OSA not on CPAP, restrictive lung disease secondary to obesity and chronic hypercarbic respiratory failure secondary to pickwickian syndrome, chronic lower extremity edema and right lower extremity venous insufficiency related to right common femoral vein reflux not amenable for endovascular therapy.  Admitted in April 2023 with altered mental status, severe hypoxemic and hypercarbic respiratory failure.  Also developed DVT at that time.  Patient seen 4-5 weeks ago for hypertension management, management of chronic diastolic heart failure and dyspnea in which I discontinued hydrochlorothiazide and switch her to olmesartan HCT.  I had also discontinued Eliquis as a DVT had resolved.  She now presents for follow-up.  1. Chronic diastolic (congestive) heart failure (HCC) In the last 6 to 8 months, patient has lost about 50 pounds in weight.  She is not anymore in heart failure.  I encouraged her to continue to lose weight and to be active.  2. Dyspnea on exertion Shortness of breath is clearly improved with weight loss, overall patient feels well.  No further PND or orthopnea.  3. Primary hypertension Blood pressure is not well-controlled.  Continue present medical therapy.  As she is continue to lose weight, expect blood pressure  to even get better hence I did not make any changes to her medications.  4. Leg swelling Leg edema has completely resolved.  Mild venous insufficiency edema has been stable especially involving the right leg, she is also compliant with her support stockings, I prescribed her 2 pairs of support stockings.  Patient's daughter is present, she is also being treated for what appears to be anxiety, depression and other psychiatric issues however patient herself denies all this and states that she does not want to be on any high-dose antidepressants or psychotropic agents.  Patient has not been Seroquel 50 mg twice daily, she has continued only with 25 mg in the evening.  She prefers to continue the same dose.  Advised her that I do not have expertise however overall from clinical standpoint she appears to be doing well, she is not planning on any harm for others or for herself and there has been no family issues as well.  I will see her back in 3 months and if she remains stable, I will see her back on a as needed basis.     Adrian Prows, MD, [facility] 09/11/2022, 8:38 PM Office: [phone]

## 2022-09-12 LAB — BASIC METABOLIC PANEL
BUN/Creatinine Ratio: 15 (ref 12–28)
BUN: 9 mg/dL (ref 8–27)
CO2: 30 mmol/L — ABNORMAL HIGH (ref 20–29)
Calcium: 9.9 mg/dL (ref 8.7–10.3)
Chloride: 98 mmol/L (ref 96–106)
Creatinine, Ser: 0.62 mg/dL (ref 0.57–1.00)
Glucose: 88 mg/dL (ref 70–99)
Potassium: 3.6 mmol/L (ref 3.5–5.2)
Sodium: 142 mmol/L (ref 134–144)
eGFR: 100 mL/min/{1.73_m2} (ref >59)

## 2022-09-12 LAB — PRO B NATRIURETIC PEPTIDE: NT-Pro BNP: 219 pg/mL (ref 0–287)

## 2022-09-15 ENCOUNTER — Encounter: Payer: Self-pay | Admitting: Nurse Practitioner

## 2022-09-15 ENCOUNTER — Ambulatory Visit (INDEPENDENT_AMBULATORY_CARE_PROVIDER_SITE_OTHER): Payer: 59 | Admitting: Nurse Practitioner

## 2022-09-15 ENCOUNTER — Other Ambulatory Visit: Payer: 59

## 2022-09-15 VITALS — BP 146/84 | HR 74 | Ht 62.0 in | Wt 177.0 lb

## 2022-09-15 DIAGNOSIS — Z8619 Personal history of other infectious and parasitic diseases: Secondary | ICD-10-CM

## 2022-09-15 DIAGNOSIS — Z1211 Encounter for screening for malignant neoplasm of colon: Secondary | ICD-10-CM

## 2022-09-15 DIAGNOSIS — R6889 Other general symptoms and signs: Secondary | ICD-10-CM | POA: Diagnosis not present

## 2022-09-15 DIAGNOSIS — D649 Anemia, unspecified: Secondary | ICD-10-CM | POA: Diagnosis not present

## 2022-09-15 MED ORDER — NA SULFATE-K SULFATE-MG SULF 17.5-3.13-1.6 GM/177ML PO SOLN: 1.0000 | Freq: Once | ORAL | 0 refills | Status: AC

## 2022-09-15 NOTE — Patient Instructions (Addendum)
You have been scheduled for a colonoscopy. Please follow written instructions given to you at your visit today.  Please pick up your prep supplies at the pharmacy within the next 1-3 days. If you use inhalers (even only as needed), please bring them with you on the day of your procedure.   Your provider has requested that you go to the basement level for lab work before leaving today. Press "B" on the elevator. The lab is located at the first door on the left as you exit the elevator.   Due to recent changes in healthcare laws, you may see the results of your imaging and laboratory studies on MyChart before your provider has had a chance to review them.  We understand that in some cases there may be results that are confusing or concerning to you. Not all laboratory results come back in the same time frame and the provider may be waiting for multiple results in order to interpret others.  Please give [place] 48 hours in order for your provider to thoroughly review all the results before contacting the office for clarification of your results.    It was a pleasure to see you today!  Thank you for trusting me with your gastrointestinal care!

## 2022-09-15 NOTE — Progress Notes (Signed)
Chief Complaint:  hemorrhoids   Assessment    Patient Profile:  Tamara Russell is a 61 y.o. year old female known to Dr Rhea Belton with a past medical history of PUD, H.pylori infection, nonobstructing Schatzki , obesity, chronic diastolic heart failure, OSA not on CPAP, restrictive lung disease, chronic hypercarbic respiratory failure secondary to pickwickian syndrome, DVT, HTN, X.  See PMH / PSH for additional history  # Colon cancer screening. Reportedly has never had any type of screening.   # Abnormal DRE with thickening of posterior wall and protrusion of soft, pink lesion from anus. Appeared to be a large protruding internal hemorrhoid. The lesion was reducible . Need to rule out malignancy.     # History of GI bleed secondary to PUD in setting of H.pylori infection / NSAID use in Feb 2020.  She cannot remember taking Pylera.   # Chronic microcytic anemia dating back years. Being treated with oral iron by another provider for "chronic iron deficiency".  Normal iron studies in April  2023.   # History of DVT, resolved. No longer on Eliquis   # Chronic diastolic heart failure followed by Dr. Jacinto Halim. Improved with weight loss. LVEF 60-65%.   # Weight loss, reportedly intentional. Down 39 pounds since July 2023.   Plan:    Check stool H.pylori ag for eradication. Not on a PPI Schedule for a screening colonoscopy. The risks and benefits of colonoscopy with possible polypectomy / biopsies were discussed and the patient agrees to proceed.. She doesn't want to have the procedure done in December. Scheduled for 1/16 with Dr. Rhea Belton.    HPI:    Tamara Russell was seen by [place] in the hospital in Feb 2020 for GI bleeding. EGD remarkable for duodenal ulcer. She was reportedly taking diclofenac. Biopsies + for H.pylori, we prescribed Pylera which she cannot remember taking. It appears follow testing for eradication was not done.   Interval history:  Patient is referred by PCP for evaluation  of hemorrhoids.  She tells me that the hemorrhoid does not bother her in any way.  She has no discomfort, no bleeding.  Her husband would like the hemorrhoid addressed. She takes iron but drinks a lot of water to avoid constipation.  No blood in stool. Grandmother had colon cancer in her 80s or 56's.  Patient says she hasn't ever had colon cancer screening.   Tamara Russell has been trying to lose weight. She has lost 39 pounds since July 2023.   Previous Labs / Imaging::    Latest Ref Rng & Units 04/19/2022    1:44 AM 04/16/2022   12:23 PM 04/16/2022    5:58 AM  CBC  WBC 4.0 - 10.5 K/uL 6.3   4.4   Hemoglobin 12.0 - 15.0 g/dL 63.8  93.7  34.2   Hematocrit 36.0 - 46.0 % 36.9  39.0  37.7   Platelets 150 - 400 K/uL 291   237     Lab Results  Component Value Date   LIPASE 25 02/04/2022      Latest Ref Rng & Units 09/11/2022    3:16 PM 04/19/2022    1:44 AM 04/16/2022   12:23 PM  CMP  Glucose 70 - 99 mg/dL 88  876    BUN 8 - 27 mg/dL 9  7    Creatinine 8.11 - 1.00 mg/dL 5.72  6.20    Sodium 355 - 144 mmol/L 142  139  137   Potassium 3.5 - 5.2 mmol/L 3.6  3.5  3.9   Chloride 96 - 106 mmol/L 98  98    CO2 20 - 29 mmol/L 30  32    Calcium 8.7 - 10.3 mg/dL 9.9  9.3    Total Protein 6.5 - 8.1 g/dL  5.7    Total Bilirubin 0.3 - 1.2 mg/dL  0.2    Alkaline Phos 38 - 126 U/L  54    AST 15 - 41 U/L  17    ALT 0 - 44 U/L  13      Previous GI Evaluation  EGD 2020 - Mild, non-obstructing, Schatzki ring at GE junction. - Non-bleeding gastric ulcers with no stigmata of bleeding. Biopsied. - Duodenal ulcer with duodenitis - Normal second portion of the duodenum.   Past Medical History:  Diagnosis Date   Acid reflux    Chronic diastolic heart failure (HCC)    Chronic respiratory failure with hypoxia and hypercapnia (HCC)    on 2 L [place] continuous   Complication of anesthesia    slow to wake up   DVT (deep venous thrombosis) (HCC)    of LLE ; dx'ed on 03/05/22   History of COVID-19    HLD  (hyperlipidemia)    Hypertension    Past Surgical History:  Procedure Laterality Date   ABDOMINAL HYSTERECTOMY     BIOPSY  11/17/2018   Procedure: BIOPSY;  Surgeon: Beverley Fiedler, MD;  Location: [facility];  Service: Gastroenterology;;   BREAST EXCISIONAL BIOPSY Left    benign   BREAST EXCISIONAL BIOPSY Right    benign   ESOPHAGOGASTRODUODENOSCOPY (EGD) WITH PROPOFOL N/A 11/17/2018   Procedure: ESOPHAGOGASTRODUODENOSCOPY (EGD) WITH PROPOFOL;  Surgeon: Beverley Fiedler, MD;  Location: [facility];  Service: Gastroenterology;  Laterality: N/A;   Family History  Problem Relation Age of Onset   Cancer Father 80       stomach   Hypertension Father    Diabetes Maternal Grandmother    Colon cancer Maternal Grandfather    Diabetes Paternal Grandmother    Colon cancer Paternal Grandmother    Social History   Tobacco Use   Smoking status: Former    Packs/day: 0.50    Years: 20.00    Total pack years: 10.00    Types: Cigarettes    Start date: 04/30/1975    Quit date: 04/30/1995    Years since quitting: 27.3   Smokeless tobacco: Never  Vaping Use   Vaping Use: Never used  Substance Use Topics   Alcohol use: No    Alcohol/week: 0.0 standard drinks of alcohol   Drug use: No   Current Outpatient Medications  Medication Sig Dispense Refill   acetaminophen (TYLENOL) 500 MG tablet Take 1 tablet (500 mg total) by mouth every 6 (six) hours as needed for moderate pain. Reported on 04/21/2016 (Patient taking differently: Take 1,000 mg by mouth every 6 (six) hours as needed for headache (pain).) 30 tablet 0   amLODipine (NORVASC) 10 MG tablet Take 10 mg by mouth daily.     atorvastatin (LIPITOR) 40 MG tablet Take 40 mg by mouth daily.     ferrous sulfate 324 MG TBEC Take 324 mg by mouth every morning.     Multiple Vitamins-Minerals (CENTRUM SILVER ADULT 50+ PO) Take 1 tablet by mouth daily.     olmesartan-hydrochlorothiazide (BENICAR HCT) 40-25 MG tablet Take 1 tablet by mouth every morning. 30  tablet 2   QUEtiapine (SEROQUEL) 50 MG tablet Take 1 tablet (50 mg total) by mouth 2 (two)  times daily. (Patient taking differently: Take 25 mg by mouth every evening.) 60 tablet 1   metoprolol succinate (TOPROL-XL) 25 MG 24 hr tablet Take 1 tablet (25 mg total) by mouth daily. 30 tablet 0   No current facility-administered medications for this visit.   Allergies  Allergen Reactions   Penicillins Other (See Comments)    Childhood allergy. Pt does not remember reaction Tolerated cefazolin 5/1-5/6, cefepime 5/12-5/14     Review of Systems: Positive for depression, fatigue, shortness of breath, swelling of feet and legs.  All other systems reviewed and negative except where noted in HPI.   Wt Readings from Last 3 Encounters:  09/15/22 177 lb (80.3 kg)  09/11/22 180 lb 12.8 oz (82 kg)  07/25/22 186 lb (84.4 kg)    Physical Exam   BP (!) 146/84   Pulse 74   Ht 5\' 2"  (1.575 m)   Wt 177 lb (80.3 kg)   BMI 32.37 kg/m  Constitutional:  Generally well appearing female in no acute distress. Psychiatric: Pleasant. Normal mood and affect. Behavior is normal. EENT: Pupils normal.  Conjunctivae are normal. No scleral icterus. Neck supple.  Cardiovascular: Normal rate, regular rhythm. No edema Pulmonary/chest: Effort normal and breath sounds normal. No wheezing, rales or rhonchi. Abdominal: Soft, nondistended, nontender. Bowel sounds active throughout. There are no masses palpable. No hepatomegaly. Rectal: Large, smooth pink lesion protruding from rectum. Lesion was reducible and filled a significant amount of the lumen.   Neurological: Alert and oriented to person place and time. Skin: Skin is warm and dry. No rashes noted.  , NP  09/15/2022, 10:46 AM  Cc:  Referring Provider 14/10/2021, MD

## 2022-09-18 ENCOUNTER — Other Ambulatory Visit: Payer: 59

## 2022-09-18 DIAGNOSIS — Z1211 Encounter for screening for malignant neoplasm of colon: Secondary | ICD-10-CM

## 2022-09-18 DIAGNOSIS — Z8619 Personal history of other infectious and parasitic diseases: Secondary | ICD-10-CM | POA: Diagnosis not present

## 2022-09-19 DIAGNOSIS — J984 Other disorders of lung: Secondary | ICD-10-CM | POA: Diagnosis not present

## 2022-09-19 DIAGNOSIS — E662 Morbid (severe) obesity with alveolar hypoventilation: Secondary | ICD-10-CM | POA: Diagnosis not present

## 2022-09-19 LAB — HELICOBACTER PYLORI  SPECIAL ANTIGEN
MICRO NUMBER:: 14265740
SPECIMEN QUALITY: ADEQUATE

## 2022-09-20 NOTE — Progress Notes (Signed)
Addendum: Reviewed and agree with assessment and management plan. Aprille Sawhney M, MD  

## 2022-09-25 ENCOUNTER — Ambulatory Visit (INDEPENDENT_AMBULATORY_CARE_PROVIDER_SITE_OTHER): Payer: 59 | Admitting: Family Medicine

## 2022-09-25 ENCOUNTER — Encounter: Payer: Self-pay | Admitting: Family Medicine

## 2022-09-25 VITALS — BP 150/76 | HR 76 | Temp 97.9°F | Ht 62.0 in | Wt 194.2 lb

## 2022-09-25 DIAGNOSIS — G9340 Encephalopathy, unspecified: Secondary | ICD-10-CM | POA: Diagnosis not present

## 2022-09-25 DIAGNOSIS — I10 Essential (primary) hypertension: Secondary | ICD-10-CM | POA: Diagnosis not present

## 2022-09-25 NOTE — Progress Notes (Signed)
Established Patient Office Visit  Subjective   Patient ID: Tamara Russell, female    DOB: June 14, 1961  Age: 61 y.o. MRN: [id number]  Chief Complaint  Patient presents with   Follow-up    Patient is here for follow up.  States that she saw the GI doctor and she is scheduled for colonoscopy. She also has a sleep study scheduled in January.   Encephalopathy-- patient has seen the psychiatrist and the neurologist for this, initially the recommendation was to increase the seroquel to 50 mg twice a day, however patient states this is "too much" for her  and so she reduced the medication to 25 mg at bedtime. I reviewed the notes from the psychiatrist who comments that her thoughts were disorganized and tangential, daughter states that she is cutting the seroquel in half at night, the patient appears to be fixated on previous medications that were prescribed to her by another physician, her thoughts are not organized and she is jumping from topic to topic. The daughter reports that since April she has physically improved but her mentation has not improved. The seroquel does help some, especially with sleep.  HTN-- Pt reports that she is taking her medication as prescribed. I reviewed her BP readings from the neurologist and the psychiatrist where she had some elevated readings. Patient reports that she has not taken her medication today. Daughter is present in the visit and she arranges her medication for her in the box.       Current Outpatient Medications  Medication Instructions   acetaminophen (TYLENOL) 500 mg, Oral, Every 6 hours PRN, Reported on 04/21/2016   amLODipine (NORVASC) 10 mg, Oral, Daily   atorvastatin (LIPITOR) 40 mg, Oral, Daily   ferrous sulfate 324 mg, Oral, Every morning   metoprolol succinate (TOPROL-XL) 25 mg, Oral, Daily   Multiple Vitamins-Minerals (CENTRUM SILVER ADULT 50+ PO) 1 tablet, Oral, Daily   olmesartan-hydrochlorothiazide (BENICAR HCT) 40-25 MG tablet 1  tablet, Oral, BH-each morning   QUEtiapine (SEROQUEL) 50 mg, Oral, 2 times daily     Patient Active Problem List   Diagnosis Date Noted   External hemorrhoids without complication 08/10/2022   Chronic venous insufficiency 07/04/2022   HLD (hyperlipidemia)    Acute encephalopathy 04/15/2022   DVT (deep venous thrombosis) (HCC) 03/05/2022   Ileus (HCC) 03/04/2022   OSA (obstructive sleep apnea) 03/04/2022   Encephalopathy    Acute metabolic encephalopathy 02/06/2022   Chronic iron deficiency anemia 02/06/2022   Acute on chronic diastolic CHF (congestive heart failure) (HCC) 02/06/2022   Bradycardia    Sinus pause    Hypokalemia    Hypomagnesemia    Chronic heart failure with preserved ejection fraction (HFpEF) (HCC)    Elevated troponin level not due to acute coronary syndrome    Obesity, Class III, BMI 40-49.9 (morbid obesity) (HCC)    GI bleed 11/19/2018   Hypertension    Acute gastric ulcer with hemorrhage    PUD (peptic ulcer disease)    GERD (gastroesophageal reflux disease) 11/19/2015      Review of Systems  All other systems reviewed and are negative.     Objective:     BP (!) 150/76 Comment: repeated by Mykal--jaf  Pulse 76   Temp 97.9 F (36.6 C) (Oral)   Ht 5\' 2"  (1.575 m)   Wt 194 lb 3.2 oz (88.1 kg)   SpO2 98%   BMI 35.52 kg/m    Physical Exam Vitals reviewed.  Constitutional:  Appearance: Normal appearance. She is well-groomed. She is obese.  Eyes:     Conjunctiva/sclera: Conjunctivae normal.  Neck:     Thyroid: No thyromegaly.  Cardiovascular:     Rate and Rhythm: Normal rate and regular rhythm.     Pulses: Normal pulses.     Heart sounds: S1 normal and S2 normal.  Pulmonary:     Effort: Pulmonary effort is normal.     Breath sounds: Normal breath sounds and air entry.  Abdominal:     General: Bowel sounds are normal.  Musculoskeletal:     Right lower leg: Edema (trace ankle only) present.     Left lower leg: No edema.   Neurological:     Mental Status: She is alert and oriented to person, place, and time. Mental status is at baseline.     Gait: Gait abnormal.     Comments: Uses walker  Psychiatric:        Mood and Affect: Mood normal. Affect is labile.        Speech: Speech is rapid and pressured and tangential.        Behavior: Behavior is agitated.        Thought Content: Thought content is paranoid.        Cognition and Memory: She exhibits impaired recent memory and impaired remote memory.        Judgment: Judgment is not inappropriate.      No results found for any visits on 09/25/22.    The 10-year ASCVD risk score (Arnett DK, et al., 2019) is: 8.9%    Assessment & Plan:   Problem List Items Addressed This Visit       Unprioritized   Hypertension - Primary    BP today is elevated but patient reports that she did not take her medication this morning. I strongly advised that she take her BP medication daily as prescribed to help prevent any further end organ damage. She has had elevated pressures at other visits, most likely she is non adherent to her medication regimen...      Encephalopathy    No improvement since the last visit. She has been seen by both neurology and psychiatry and has had imaging and other studies. She is confabulating her history today (daughter contradicted her several times in the visit) and the patient became agitated today. Her judgement and insight in to her condition are still very poor, her thoughts are disorganized, somewhat paranoid, and she had gaps in her short and long term memory. The psychiatrist recommended increasing the seroquel however the patient could not tolerate the increase as it was too sedating. I offered to try her on a different mood stabilizer however the patient declined. She has a sleep study coming up soon to look for OSA, she reports she is no longer wearing the CPAP machine at night. Will follow up with her in 6 months.        Return  in about 6 months (around 03/27/2023) for Follow up.    Karie Georges, MD

## 2022-09-25 NOTE — Assessment & Plan Note (Signed)
No improvement since the last visit. She has been seen by both neurology and psychiatry and has had imaging and other studies. She is confabulating her history today (daughter contradicted her several times in the visit) and the patient became agitated today. Her judgement and insight in to her condition are still very poor, her thoughts are disorganized, somewhat paranoid, and she had gaps in her short and long term memory. The psychiatrist recommended increasing the seroquel however the patient could not tolerate the increase as it was too sedating. I offered to try her on a different mood stabilizer however the patient declined. She has a sleep study coming up soon to look for OSA, she reports she is no longer wearing the CPAP machine at night. Will follow up with her in 6 months.

## 2022-09-25 NOTE — Assessment & Plan Note (Signed)
BP today is elevated but patient reports that she did not take her medication this morning. I strongly advised that she take her BP medication daily as prescribed to help prevent any further end organ damage. She has had elevated pressures at other visits, most likely she is non adherent to her medication regimen.Marland KitchenMarland Kitchen

## 2022-09-26 ENCOUNTER — Ambulatory Visit (INDEPENDENT_AMBULATORY_CARE_PROVIDER_SITE_OTHER): Payer: 59 | Admitting: Podiatry

## 2022-09-26 ENCOUNTER — Encounter (HOSPITAL_BASED_OUTPATIENT_CLINIC_OR_DEPARTMENT_OTHER): Payer: 59 | Admitting: Pulmonary Disease

## 2022-09-26 DIAGNOSIS — B351 Tinea unguium: Secondary | ICD-10-CM

## 2022-09-26 DIAGNOSIS — M79675 Pain in left toe(s): Secondary | ICD-10-CM

## 2022-09-26 DIAGNOSIS — M21371 Foot drop, right foot: Secondary | ICD-10-CM

## 2022-09-26 DIAGNOSIS — M79674 Pain in right toe(s): Secondary | ICD-10-CM | POA: Diagnosis not present

## 2022-09-26 NOTE — Patient Instructions (Signed)
Look for 10-98mmHg compression stockings knee high

## 2022-09-28 NOTE — Progress Notes (Addendum)
  Subjective:  Patient ID: Tamara Russell, female    DOB: 01-24-1961,  MRN: [id number]  Chief Complaint  Patient presents with   Nail Problem    Thick painful toenails, 3 month follow up    61 y.o. female presents with the above complaint. History confirmed with patient.  She is here again with her husband as well as her daughter today.  The nails are thick and thick and elongated again. She has had difficulty walking with the foot slapping the floor on the right feels like she is tripping  Objective:  Physical Exam: warm, good capillary refill, no trophic changes or ulcerative lesions, normal DP and PT pulses, normal sensory exam, and thickened elongated brown-yellow discolored toenails with subungual debris.   Foot drop noted on R   Assessment:   1. Foot drop, right foot   2. Pain due to onychomycosis of toenails of both feet      Plan:  Patient was evaluated and treated and all questions answered.  Discussed the etiology and treatment options for the condition in detail with the patient. Educated patient on the topical and oral treatment options for mycotic nails. Recommended debridement of the nails today. Sharp and mechanical debridement performed of all painful and mycotic nails today. Nails debrided in length and thickness using a nail nipper to level of comfort. Discussed treatment options including appropriate shoe gear. Follow up as needed for painful nails.     AFO order written for foot drop on right   Return in about 3 months (around 12/26/2022) for painful nails.

## 2022-10-02 ENCOUNTER — Ambulatory Visit (HOSPITAL_COMMUNITY): Payer: 59 | Admitting: Psychiatry

## 2022-10-04 DIAGNOSIS — I82409 Acute embolism and thrombosis of unspecified deep veins of unspecified lower extremity: Secondary | ICD-10-CM | POA: Diagnosis not present

## 2022-10-04 DIAGNOSIS — I5033 Acute on chronic diastolic (congestive) heart failure: Secondary | ICD-10-CM | POA: Diagnosis not present

## 2022-10-09 DIAGNOSIS — R778 Other specified abnormalities of plasma proteins: Secondary | ICD-10-CM | POA: Diagnosis not present

## 2022-10-09 DIAGNOSIS — K922 Gastrointestinal hemorrhage, unspecified: Secondary | ICD-10-CM | POA: Diagnosis not present

## 2022-10-09 DIAGNOSIS — R4182 Altered mental status, unspecified: Secondary | ICD-10-CM | POA: Diagnosis not present

## 2022-10-09 DIAGNOSIS — I10 Essential (primary) hypertension: Secondary | ICD-10-CM | POA: Diagnosis not present

## 2022-10-10 ENCOUNTER — Telehealth: Payer: Self-pay | Admitting: Pulmonary Disease

## 2022-10-10 NOTE — Telephone Encounter (Signed)
Per Melissa w/ [employer], pt let [employer] RT know that she would like to discontinue her ventilator before her sleep study.  Can you ask Dr. Wynona Neat to enter an order if he agrees please?  Pt has NPSG scheduled for 11/05/22.

## 2022-10-12 NOTE — Telephone Encounter (Signed)
Called and spoke with pt letting her know the info per Dr. Val Eagle and she verbalized understanding. Nothing further needed.

## 2022-10-12 NOTE — Telephone Encounter (Signed)
I will recommend that she continue using the trilogy ventilator until she gets the sleep study done and we know always going to continue treating the sleep disordered breathing.    She can choose to have it discontinued as long as she understands the risk of not treating sleep disordered breathing -Last blood gas we have on record does show that she does retain carbon dioxide, this is the reason for starting on the trilogy ventilator -We will lose the benefit if machine is discontinued  I can place order for discontinuation if that is what the patient will like to do-risk of heart problems, risk of stroke, significant risk of coming to harm

## 2022-10-18 ENCOUNTER — Telehealth: Payer: Self-pay | Admitting: Family Medicine

## 2022-10-18 NOTE — Telephone Encounter (Signed)
Requesting samples of QUEtiapine (SEROQUEL) 50 MG tablet , patient has misplaced the meds. Last fill by another provider

## 2022-10-18 NOTE — Telephone Encounter (Signed)
Spoke with the patient's husband and advised he contact Dr Nelida Gores' office (behavioral health) as Dr Legrand Como did not prescribe this medication for the patient.

## 2022-10-20 ENCOUNTER — Ambulatory Visit: Payer: 59 | Admitting: Internal Medicine

## 2022-10-20 DIAGNOSIS — J984 Other disorders of lung: Secondary | ICD-10-CM | POA: Diagnosis not present

## 2022-10-20 DIAGNOSIS — E662 Morbid (severe) obesity with alveolar hypoventilation: Secondary | ICD-10-CM | POA: Diagnosis not present

## 2022-10-23 ENCOUNTER — Other Ambulatory Visit (HOSPITAL_COMMUNITY): Payer: Self-pay | Admitting: Psychiatry

## 2022-10-23 ENCOUNTER — Other Ambulatory Visit: Payer: Self-pay | Admitting: Family Medicine

## 2022-10-23 DIAGNOSIS — F5105 Insomnia due to other mental disorder: Secondary | ICD-10-CM

## 2022-10-23 DIAGNOSIS — G9340 Encephalopathy, unspecified: Secondary | ICD-10-CM

## 2022-10-24 ENCOUNTER — Encounter: Payer: Self-pay | Admitting: Internal Medicine

## 2022-10-26 ENCOUNTER — Telehealth: Payer: Self-pay | Admitting: Nurse Practitioner

## 2022-10-26 NOTE — Telephone Encounter (Signed)
Patient is calling to reschedule her colonoscopy and is requesting a new prep instructions paper. Please advise

## 2022-10-27 NOTE — Telephone Encounter (Signed)
Left message for pt to call back.

## 2022-10-31 ENCOUNTER — Encounter: Payer: 59 | Admitting: Internal Medicine

## 2022-11-02 ENCOUNTER — Other Ambulatory Visit: Payer: Self-pay | Admitting: Cardiology

## 2022-11-02 DIAGNOSIS — I10 Essential (primary) hypertension: Secondary | ICD-10-CM

## 2022-11-04 DIAGNOSIS — I5033 Acute on chronic diastolic (congestive) heart failure: Secondary | ICD-10-CM | POA: Diagnosis not present

## 2022-11-04 DIAGNOSIS — I82409 Acute embolism and thrombosis of unspecified deep veins of unspecified lower extremity: Secondary | ICD-10-CM | POA: Diagnosis not present

## 2022-11-05 ENCOUNTER — Ambulatory Visit (INDEPENDENT_AMBULATORY_CARE_PROVIDER_SITE_OTHER): Payer: 59 | Admitting: Neurology

## 2022-11-05 DIAGNOSIS — R001 Bradycardia, unspecified: Secondary | ICD-10-CM

## 2022-11-05 DIAGNOSIS — G7281 Critical illness myopathy: Secondary | ICD-10-CM

## 2022-11-05 DIAGNOSIS — R4586 Emotional lability: Secondary | ICD-10-CM

## 2022-11-05 DIAGNOSIS — G4734 Idiopathic sleep related nonobstructive alveolar hypoventilation: Secondary | ICD-10-CM

## 2022-11-05 DIAGNOSIS — G4733 Obstructive sleep apnea (adult) (pediatric): Secondary | ICD-10-CM | POA: Diagnosis not present

## 2022-11-05 DIAGNOSIS — G4720 Circadian rhythm sleep disorder, unspecified type: Secondary | ICD-10-CM

## 2022-11-05 DIAGNOSIS — R9431 Abnormal electrocardiogram [ECG] [EKG]: Secondary | ICD-10-CM

## 2022-11-05 DIAGNOSIS — G9340 Encephalopathy, unspecified: Secondary | ICD-10-CM

## 2022-11-06 NOTE — Telephone Encounter (Signed)
Pt is scheduled for 11/30/22. Instructions sent to [facility] and mailed. Pt verbalized understanding.

## 2022-11-07 NOTE — Procedures (Unsigned)
Physician Interpretation (Study was interpreted on behalf of Dr. Brett Fairy):   Referred by: Farrel Conners, MD ***  History and Indication for Testing: 62 year old female with an underlying complex medical history of encephalopathy, hypertension, hyperlipidemia, reflux disease, history of DVT, history of COVID, history of chronic respiratory failure with hypoxia and hypercapnia, history of oxygen dependence, and obesity, who presents for evaluation of her sleep apnea.  She has been on PAP therapy in the past but no longer uses a PAP machine or supplemental oxygen at home.  EEG: Review of the EEG showed no abnormal electrical discharges and symmetrical bihemispheric findings.    EKG: The EKG revealed normal sinus rhythm (NSR).   AUDIO/VIDEO REVIEW: The audio and video review did not show any abnormal or unusual behaviors, movements, phonations or vocalizations. The patient took ***. Snoring was ***  POST-STUDY QUESTIONNAIRE: Post study, the patient indicated, that sleep was *** the same as usual. The patient stated ***  IMPRESSION:   Obstructive Sleep Apnea (OSA) Nocturnal Hypoxemia Dysfunctions associated with sleep stages or arousal from sleep ***Non-specific abnormal electrocardiogram (EKG) ***Poor sleep pattern ***Inconclusive Test  RECOMMENDATIONS:        I certify that I have reviewed the entire raw data recording prior to the issuance of this report in accordance with the Standards of Accreditation of the American Academy of Sleep Medicine (AASM).  Star Age, MD, PhD Medical Director, [facility]) [place], ABPN (Neurology and Sleep)   Technical Report:   ***

## 2022-11-08 ENCOUNTER — Telehealth: Payer: Self-pay | Admitting: Neurology

## 2022-11-08 DIAGNOSIS — G4734 Idiopathic sleep related nonobstructive alveolar hypoventilation: Secondary | ICD-10-CM

## 2022-11-08 DIAGNOSIS — G4733 Obstructive sleep apnea (adult) (pediatric): Secondary | ICD-10-CM

## 2022-11-08 NOTE — Telephone Encounter (Signed)
This patient saw Dr. Brett Fairy for sleep evaluation on 06/12/22.  I read the PSG from 11/05/22 on Dr. Edwena Felty behalf:   Please call and notify the patient that the recent sleep study did confirm the diagnosis of obstructive sleep apnea. OSA is in the severe range and she did have significant oxygen drops during sleep, especially during dream sleep. I recommend she start treatment at home with an autoPAP machine. I am happy to order this on behalf of Dr. Brett Fairy, and we can send the order to her previous/existing DME provider. The DME representative will educate her on how to use the machine, how to put the mask on, etc. I have placed an order in the chart. Please send referral, talk to patient, send report to referring MD. We will need a FU in sleep clinic for 10 weeks post-PAP set up, please arrange that with Dr. D or one of our NPs. Thanks,   Tamara Age, MD, PhD [facility])

## 2022-11-09 DIAGNOSIS — I10 Essential (primary) hypertension: Secondary | ICD-10-CM | POA: Diagnosis not present

## 2022-11-09 DIAGNOSIS — K922 Gastrointestinal hemorrhage, unspecified: Secondary | ICD-10-CM | POA: Diagnosis not present

## 2022-11-09 DIAGNOSIS — R4182 Altered mental status, unspecified: Secondary | ICD-10-CM | POA: Diagnosis not present

## 2022-11-09 DIAGNOSIS — R778 Other specified abnormalities of plasma proteins: Secondary | ICD-10-CM | POA: Diagnosis not present

## 2022-11-09 NOTE — Telephone Encounter (Signed)
Called patient to discuss sleep study results. No answer at this time. LVM for the patient to call back.   

## 2022-11-09 NOTE — Telephone Encounter (Signed)
Pt spouse is asking for a call back from [place], [place]

## 2022-11-09 NOTE — Telephone Encounter (Signed)
Called husband back and received the VM again. LVM asking for a call back.

## 2022-11-13 ENCOUNTER — Telehealth: Payer: Self-pay | Admitting: Family Medicine

## 2022-11-13 DIAGNOSIS — I10 Essential (primary) hypertension: Secondary | ICD-10-CM

## 2022-11-13 MED ORDER — TELMISARTAN-HCTZ 80-25 MG PO TABS: 1.0000 | ORAL_TABLET | Freq: Every day | ORAL | 1 refills | Status: DC

## 2022-11-13 NOTE — Addendum Note (Signed)
Addended by: Farrel Conners on: 11/13/2022 12:40 PM   Modules accepted: Orders

## 2022-11-13 NOTE — Telephone Encounter (Signed)
Pt's husband called back. Reviewed the sleep study results and advised the patient that she recommends treating this with auto CPAP.  Pt states she currently has a machine and was given this when she was dc from the hospital.  At her visit with Dr Dohmeier there was not much information available to go off of so we didn't know what type of machine she used. Pt states she was set up with machine through [facility], Advised I will send the updated order with the new machine to [facility].  After further investigating, it looks like the patient may have been on a trillegy before. I advised if they get her set up with a new machine then she may have to be seen sooner than the scheduled 4/30 appt that was made today. They verbalized understanding.

## 2022-11-13 NOTE — Telephone Encounter (Signed)
Spoke with the patient's husband and informed him of the message below.  ?

## 2022-11-13 NOTE — Telephone Encounter (Signed)
I switched her to telmisartan HCT

## 2022-11-13 NOTE — Telephone Encounter (Signed)
Patient says olmesartan-hydrochlorothiazide (BENICAR HCT) 40-25 MG tablet  is very expensive and asking if there are any samples available.

## 2022-11-20 DIAGNOSIS — J984 Other disorders of lung: Secondary | ICD-10-CM | POA: Diagnosis not present

## 2022-11-20 DIAGNOSIS — E662 Morbid (severe) obesity with alveolar hypoventilation: Secondary | ICD-10-CM | POA: Diagnosis not present

## 2022-11-27 ENCOUNTER — Ambulatory Visit (HOSPITAL_COMMUNITY): Payer: 59 | Admitting: Psychiatry

## 2022-11-27 NOTE — Progress Notes (Deleted)
BH MD/PA/NP OP Progress Note  11/27/2022 12:28 PM Tamara Russell  MRN:  [id number]  Visit Diagnosis: No diagnosis found.  Assessment: Tamara Russell is a 62 y.o. female with a history of OSA, hospitalization in April-May of 2023 for acute encephalopathy and delirium who presented to [place] at [facility] for initial evaluation on 09/06/2022.    At initial evaluation patient presented as disorganized, with mood lability, tangential thought process, pressured speech, and elevated volume.  There appeared to be waxing and waning in her level of attention/concentration.  Often times patient was illogical and she endorsed questionable delusional/paranoid thoughts.  Of note patient carries no previous psychiatric diagnoses and has past history significant for a multiple month medical hospitalization for acute encephalopathy.  Patient had workup including MRI, LP, EEG over the course of her hospitalization addition to receiving IVIG treatment.  Patient eventually improved and had been discharged to rehab followed by transition back home. 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   She would also benefit from continuing to follow neurology and sleep study.    [facility] presents for follow-up evaluation. Today, 11/27/22, patient reports ***  Of note patient had a sleep study and was found to have severe OSA was recommended to wear CPAP  Plan: - Increase Seroquel 50 mg BID - CMP, CBC, Vit B12, folate, A1c reviewed - Ordered UA and CBC - Sleep study showed severe obstructive sleep apnea - Crisis resources reviewed - Can consider counseling in the future - Follow up in 8 weeks   Chief Complaint: No chief complaint on file.  HPI: ***   Past Psychiatric History: Patient had no prior psychiatric history up until her hospitalization in April to June of 2023.  At which time  she was assessed by the psychiatric consult team for acute delirium/encephalitis.  Patient had been trialed on Zyprexa, Haldol, Geodon, and Seroquel during that time.  She was discharged on Seroquel.  Patient denies any prior suicide attempts or psychiatric hospitalizations.  Past Medical History:  Past Medical History:  Diagnosis Date   Acid reflux    Chronic diastolic heart failure (HCC)    Chronic respiratory failure with hypoxia and hypercapnia (HCC)    on 2 L [place] continuous   Complication of anesthesia    slow to wake up   DVT (deep venous thrombosis) (HCC)    of LLE ; dx'ed on 03/05/22   History of COVID-19    HLD (hyperlipidemia)    Hypertension     Past Surgical History:  Procedure Laterality Date   ABDOMINAL HYSTERECTOMY     BIOPSY  11/17/2018   Procedure: BIOPSY;  Surgeon: Jerene Bears, MD;  Location: [facility];  Service: Gastroenterology;;   BREAST EXCISIONAL BIOPSY Left    benign   BREAST EXCISIONAL BIOPSY Right    benign   ESOPHAGOGASTRODUODENOSCOPY (EGD) WITH PROPOFOL N/A 11/17/2018   Procedure: ESOPHAGOGASTRODUODENOSCOPY (EGD) WITH PROPOFOL;  Surgeon: Jerene Bears, MD;  Location: [place];  Service: Gastroenterology;  Laterality: N/A;    Family History:  Family History  Problem Relation Age of Onset   Cancer Father 101       stomach   Hypertension Father    Diabetes Maternal Grandmother    Colon cancer Maternal Grandfather    Diabetes Paternal Grandmother    Colon cancer Paternal Grandmother     Social History:  Social History  Socioeconomic History   Marital status: Married    Spouse name: Not on file   Number of children: 3   Years of education: Not on file   Highest education level: Not on file  Occupational History   Occupation: unemployed  Tobacco Use   Smoking status: Former    Packs/day: 0.50    Years: 20.00    Total pack years: 10.00    Types: Cigarettes    Start date: 04/30/1975    Quit date: 04/30/1995    Years since quitting:  27.5   Smokeless tobacco: Never  Vaping Use   Vaping Use: Never used  Substance and Sexual Activity   Alcohol use: No    Alcohol/week: 0.0 standard drinks of alcohol   Drug use: No   Sexual activity: Not on file  Other Topics Concern   Not on file  Social History Narrative   Not on file   Social Determinants of Health   Financial Resource Strain: Not on file  Food Insecurity: Not on file  Transportation Needs: Not on file  Physical Activity: Not on file  Stress: Not on file  Social Connections: Not on file    Allergies:  Allergies  Allergen Reactions   Penicillins Other (See Comments)    Childhood allergy. Pt does not remember reaction Tolerated cefazolin 5/1-5/6, cefepime 5/12-5/14    Current Medications: Current Outpatient Medications  Medication Sig Dispense Refill   acetaminophen (TYLENOL) 500 MG tablet Take 1 tablet (500 mg total) by mouth every 6 (six) hours as needed for moderate pain. Reported on 04/21/2016 (Patient taking differently: Take 1,000 mg by mouth every 6 (six) hours as needed for headache (pain).) 30 tablet 0   amLODipine (NORVASC) 10 MG tablet Take 10 mg by mouth daily.     atorvastatin (LIPITOR) 40 MG tablet Take 40 mg by mouth daily.     ferrous sulfate 324 MG TBEC Take 324 mg by mouth every morning.     metoprolol succinate (TOPROL-XL) 25 MG 24 hr tablet Take 1 tablet (25 mg total) by mouth daily. 30 tablet 0   Multiple Vitamins-Minerals (CENTRUM SILVER ADULT 50+ PO) Take 1 tablet by mouth daily.     QUEtiapine (SEROQUEL) 50 MG tablet TAKE 1 TABLET BY MOUTH TWICE A DAY 60 tablet 1   telmisartan-hydrochlorothiazide (MICARDIS HCT) 80-25 MG tablet Take 1 tablet by mouth daily. 90 tablet 1   No current facility-administered medications for this visit.     Musculoskeletal: Strength & Muscle Tone: {desc; muscle tone:32375} Gait & Station: {PE GAIT ED QX:8161427 Patient leans: {Patient Leans:21022755}  Psychiatric Specialty Exam: Review of Systems   There were no vitals taken for this visit.There is no height or weight on file to calculate BMI.  General Appearance: {Appearance:22683}  Eye Contact:  {BHH EYE CONTACT:22684}  Speech:  {Speech:22685}  Volume:  {Volume (PAA):22686}  Mood:  {BHH MOOD:22306}  Affect:  {Affect (PAA):22687}  Thought Process:  {Thought Process (PAA):22688}  Orientation:  {BHH ORIENTATION (PAA):22689}  Thought Content: {Thought Content:22690}   Suicidal Thoughts:  {ST/HT (PAA):22692}  Homicidal Thoughts:  {ST/HT (PAA):22692}  Memory:  {BHH MEMORY:22881}  Judgement:  {Judgement (PAA):22694}  Insight:  {Insight (PAA):22695}  Psychomotor Activity:  {Psychomotor (PAA):22696}  Concentration:  {Concentration:21399}  Recall:  {BHH GOOD/FAIR/POOR:22877}  Fund of Knowledge: {BHH GOOD/FAIR/POOR:22877}  Language: {BHH GOOD/FAIR/POOR:22877}  Akathisia:  {BHH YES OR NO:22294}    AIMS (if indicated): {Desc; done/not:10129}  Assets:  {Assets (PAA):22698}  ADL's:  {BHH TW:9249394  Cognition: {chl bhh cognition:304700322}  Sleep:  {BHH 0000000   Metabolic Disorder Labs: Lab Results  Component Value Date   HGBA1C 4.7 (L) 04/16/2022   MPG 88.19 04/16/2022   MPG 114 04/21/2016   No results found for: "PROLACTIN" Lab Results  Component Value Date   CHOL 163 02/02/2022   TRIG 84 02/25/2022   HDL 78 02/02/2022   CHOLHDL 2.1 02/02/2022   VLDL 5 02/02/2022   LDLCALC 80 02/02/2022   LDLCALC 87 10/13/2016   Lab Results  Component Value Date   TSH 1.798 04/15/2022   TSH 1.377 02/02/2022    Therapeutic Level Labs: No results found for: "LITHIUM" Lab Results  Component Value Date   VALPROATE 39 (L) 02/18/2022   VALPROATE 63 02/17/2022   No results found for: "CBMZ"   Screenings: [place] Office Visit from 03/30/2017 in [place] Office Visit from 12/28/2016 in [place] Office Visit from 10/02/2016 in  [place] Office Visit from 04/21/2016 in [place]  Total GAD-7 Score 0 0 0 0      PHQ2-9    [place] Office Visit from 09/25/2022 in [place] at [facility] from 08/10/2022 in [place] at [facility] from 06/27/2022 in [place] at [facility] from 03/30/2017 in [place] Office Visit from 03/05/2017 in [place]  PHQ-2 Total Score 0 3 5 0 0  PHQ-9 Total Score 1 9 14 $ 0 1      Flowsheet Row ED to Hosp-Admission (Discharged) from 04/15/2022 in [place] [facility] ED from 04/12/2022 in [facility] Emergency Department at [facility] ED to Hosp-Admission (Discharged) from 02/01/2022 in [place] No Risk No Risk No Risk       Collaboration of Care: Collaboration of Care: Medication Management AEB medication prescription, Primary Care Provider AEB chart review, and Other provider involved in patient's care AEB sleep/neurology chart review  Patient/Guardian was advised Release of Information must be obtained prior to any record release in order to collaborate their care with an outside provider. Patient/Guardian was advised if they have not already done so to contact the registration department to sign all necessary forms in order for [place] to release information regarding their care.   Consent: Patient/Guardian gives verbal consent for treatment and assignment of benefits for services provided during this visit. Patient/Guardian expressed understanding and agreed to proceed.    Vista Mink, MD 11/27/2022, 12:28 PM

## 2022-11-28 ENCOUNTER — Telehealth: Payer: Self-pay | Admitting: Family Medicine

## 2022-11-28 NOTE — Telephone Encounter (Signed)
Patient applying for Medicare/disability benefits. Requesting a letter documenting the conditions she is being treated for, mentioned drop foot specifically. Says she would like to pick letter up and to call when it's ready

## 2022-11-29 DIAGNOSIS — M21371 Foot drop, right foot: Secondary | ICD-10-CM | POA: Diagnosis not present

## 2022-11-30 ENCOUNTER — Encounter: Payer: 59 | Admitting: Internal Medicine

## 2022-11-30 ENCOUNTER — Encounter: Payer: Self-pay | Admitting: Family Medicine

## 2022-11-30 NOTE — Telephone Encounter (Signed)
Spoke with the patient and informed her the letter was completed and left at the front desk for pick up.

## 2022-11-30 NOTE — Telephone Encounter (Signed)
Ok I wrote the letter and put it on Mychart, ok to print for patietn if needed also

## 2022-12-05 DIAGNOSIS — I82409 Acute embolism and thrombosis of unspecified deep veins of unspecified lower extremity: Secondary | ICD-10-CM | POA: Diagnosis not present

## 2022-12-05 DIAGNOSIS — I5033 Acute on chronic diastolic (congestive) heart failure: Secondary | ICD-10-CM | POA: Diagnosis not present

## 2022-12-12 ENCOUNTER — Ambulatory Visit: Payer: 59 | Admitting: Cardiology

## 2022-12-15 DIAGNOSIS — E782 Mixed hyperlipidemia: Secondary | ICD-10-CM | POA: Diagnosis not present

## 2022-12-15 DIAGNOSIS — E1169 Type 2 diabetes mellitus with other specified complication: Secondary | ICD-10-CM | POA: Diagnosis not present

## 2022-12-15 DIAGNOSIS — E785 Hyperlipidemia, unspecified: Secondary | ICD-10-CM | POA: Diagnosis not present

## 2022-12-15 DIAGNOSIS — I10 Essential (primary) hypertension: Secondary | ICD-10-CM | POA: Diagnosis not present

## 2022-12-20 ENCOUNTER — Ambulatory Visit: Payer: Self-pay | Admitting: Cardiology

## 2022-12-20 NOTE — Progress Notes (Signed)
Primary Physician/Referring:  Farrel Conners, MD  Patient ID: Tamara Russell, female    DOB: [birth year], 62 y.o.   MRN: [id number]  NO SHOW  HPI:    Tamara Russell  is a 62 y.o. AAfrican-American female patient with primary hypertension, chronic diastolic heart failure, morbid obesity with severe OSA not on CPAP, restrictive lung disease secondary to obesity and chronic hypercarbic respiratory failure secondary to pickwickian syndrome, right lower extremity venous insufficiency related to right common femoral vein reflux not amenable for endovascular therapy.  Admitted in April 2023 with altered mental status, severe hypoxemic and hypercarbic respiratory failure.  Also developed DVT at that time.  Patient seen 3 months ago, she had made significant lifestyle changes and had complete resolution of leg edema, no acute decompensated heart failure, blood pressure is well-controlled.  She wanted to be off of all psychotropic medications.  She now presents for follow-up of heart failure and hypertension.  In the interim she was diagnosed with severe OSA on 11/05/2022 sleep study, previously diagnosed with OSA but not on CPAP.  States that she is feeling the best she has in quite a while, dyspnea is improved with weight loss, no PND or orthopnea, she has very minimal leg edema since especially weight loss.  Past Medical History:  Diagnosis Date   Acid reflux    Chronic diastolic heart failure (HCC)    Chronic respiratory failure with hypoxia and hypercapnia (HCC)    on 2 L [place] continuous   Complication of anesthesia    slow to wake up   DVT (deep venous thrombosis) (HCC)    of LLE ; dx'ed on 03/05/22   History of COVID-19    HLD (hyperlipidemia)    Hypertension    Past Surgical History:  Procedure Laterality Date   ABDOMINAL HYSTERECTOMY     BIOPSY  11/17/2018   Procedure: BIOPSY;  Surgeon: Jerene Bears, MD;  [facility];  Service: Gastroenterology;;   BREAST  EXCISIONAL BIOPSY Left    benign   BREAST EXCISIONAL BIOPSY Right    benign   ESOPHAGOGASTRODUODENOSCOPY (EGD) WITH PROPOFOL N/A 11/17/2018   Procedure: ESOPHAGOGASTRODUODENOSCOPY (EGD) WITH PROPOFOL;  Surgeon: Jerene Bears, MD;  Location: [place];  Service: Gastroenterology;  Laterality: N/A;   Family History  Problem Relation Age of Onset   Cancer Father 82       stomach   Hypertension Father    Diabetes Maternal Grandmother    Colon cancer Maternal Grandfather    Diabetes Paternal Grandmother    Colon cancer Paternal Grandmother     Social History   Tobacco Use   Smoking status: Former    Packs/day: 0.50    Years: 20.00    Total pack years: 10.00    Types: Cigarettes    Start date: 04/30/1975    Quit date: 04/30/1995    Years since quitting: 27.6   Smokeless tobacco: Never  Substance Use Topics   Alcohol use: No    Alcohol/week: 0.0 standard drinks of alcohol   Marital Status: Married  ROS  Review of Systems  Constitutional: Positive for weight loss (Intentional).  Cardiovascular:  Positive for dyspnea on exertion. Negative for chest pain, leg swelling and orthopnea.   Objective      09/25/2022   10:31 AM 09/25/2022   10:21 AM 09/15/2022   10:15 AM  Vitals with BMI  Height  5\' 2"  5\' 2"   Weight  194 lbs 3 oz 177 lbs  BMI  38.25 05.39  Systolic [phone]  Diastolic 76 82 84  Pulse  76 74   There were no vitals filed for this visit.  There is no height or weight on file to calculate BMI.  Physical Exam Constitutional:      Appearance: She is obese.  Neck:     Vascular: No carotid bruit or JVD.  Cardiovascular:     Rate and Rhythm: Normal rate and regular rhythm.     Pulses: Normal pulses and intact distal pulses.     Heart sounds: Normal heart sounds. No murmur heard.    No gallop.  Pulmonary:     Effort: Pulmonary effort is normal.     Breath sounds: Normal breath sounds.  Abdominal:     General: Bowel sounds are normal.     Palpations:  Abdomen is soft.     Comments: Obese. Pannus present  Musculoskeletal:     Right lower leg: No edema.     Left lower leg: No edema.    Medications and allergies   Allergies  Allergen Reactions   Penicillins Other (See Comments)    Childhood allergy. Pt does not remember reaction Tolerated cefazolin 5/1-5/6, cefepime 5/12-5/14    Medication list after today's encounter   Current Outpatient Medications:    acetaminophen (TYLENOL) 500 MG tablet, Take 1 tablet (500 mg total) by mouth every 6 (six) hours as needed for moderate pain. Reported on 04/21/2016 (Patient taking differently: Take 1,000 mg by mouth every 6 (six) hours as needed for headache (pain).), Disp: 30 tablet, Rfl: 0   amLODipine (NORVASC) 10 MG tablet, Take 10 mg by mouth daily., Disp: , Rfl:    atorvastatin (LIPITOR) 40 MG tablet, Take 40 mg by mouth daily., Disp: , Rfl:    ferrous sulfate 324 MG TBEC, Take 324 mg by mouth every morning., Disp: , Rfl:    metoprolol succinate (TOPROL-XL) 25 MG 24 hr tablet, Take 1 tablet (25 mg total) by mouth daily., Disp: 30 tablet, Rfl: 0   Multiple Vitamins-Minerals (CENTRUM SILVER ADULT 50+ PO), Take 1 tablet by mouth daily., Disp: , Rfl:    QUEtiapine (SEROQUEL) 50 MG tablet, TAKE 1 TABLET BY MOUTH TWICE A DAY, Disp: 60 tablet, Rfl: 1   telmisartan-hydrochlorothiazide (MICARDIS HCT) 80-25 MG tablet, Take 1 tablet by mouth daily., Disp: 90 tablet, Rfl: 1 Laboratory examination:   Lab Results  Component Value Date   NA 142 09/11/2022   K 3.6 09/11/2022   CO2 30 (H) 09/11/2022   GLUCOSE 88 09/11/2022   BUN 9 09/11/2022   CREATININE 0.62 09/11/2022   CALCIUM 9.9 09/11/2022   EGFR 100 09/11/2022   GFRNONAA >60 04/19/2022       Latest Ref Rng & Units 09/11/2022    3:16 PM 04/19/2022    1:44 AM 04/16/2022   12:23 PM  BMP  Glucose 70 - 99 mg/dL 88  108    BUN 8 - 27 mg/dL 9  7    Creatinine 0.57 - 1.00 mg/dL 0.62  0.47    BUN/Creat Ratio 12 - 28 15     Sodium 134 - 144 mmol/L 142   139  137   Potassium 3.5 - 5.2 mmol/L 3.6  3.5  3.9   Chloride 96 - 106 mmol/L 98  98    CO2 20 - 29 mmol/L 30  32    Calcium 8.7 - 10.3 mg/dL 9.9  9.3        Latest Ref Rng &  Units 04/19/2022    1:44 AM 04/16/2022    5:58 AM 04/15/2022    3:20 PM  Hepatic Function  Total Protein 6.5 - 8.1 g/dL 5.7  5.7  6.0   Albumin 3.5 - 5.0 g/dL 2.5  2.5  2.9   AST 15 - 41 U/L 17  30  22    ALT 0 - 44 U/L 13  15  17    Alk Phosphatase 38 - 126 U/L 54  42  48   Total Bilirubin 0.3 - 1.2 mg/dL 0.2  0.6  0.8     Lipid Panel Recent Labs    02/02/22 0424 02/25/22 0341  CHOL 163  --   TRIG 25 84  LDLCALC 80  --   VLDL 5  --   HDL 78  --   CHOLHDL 2.1  --     HEMOGLOBIN A1C Lab Results  Component Value Date   HGBA1C 4.7 (L) 04/16/2022   MPG 88.19 04/16/2022   TSH Recent Labs    02/02/22 0447 04/15/22 1520  TSH 1.377 1.798    External labs:   11/22/2021 Total cholesterol 167, triglycerides 41, HDL 71, LDL 82, non-HDL 94. BUN 9, creatinine 0.57. eGFR >60. Sodium 138, potassium 3.7, chloride 90, bicarb 39, AST 19, ALT 13, alkaline phosphatase 65 A1c 5.8 BNP 14. Hemoglobin 13.7 g/dL, hematocrit 43.7% D-dimer 0.21   Radiology:   Lower EXTR venous duplex 03/05/2022: RIGHT: - There is no evidence of deep vein thrombosis in the lower extremity. However, portions of this examination were limited- see technologist comments above. Pulsatile waveforms suggestive of fluid overload   LEFT: - Findings consistent with acute deep vein thrombosis involving the TP Trunk. Pulsatile waveforms suggestive of fluid overload.  Repeat study 04/11/2022: Negative for sonographic evidence of DVT.  Chest x-ray 1 view 04/15/2022: Monitoring device projects over LEFT chest. Minimal enlargement of cardiac silhouette. Mediastinal contours and pulmonary vascularity normal. Bibasilar atelectasis.  Cardiac Studies:   Cardiac event monitor 2020-02-14 Sinus rhythm Occasional premature ventricular  contractions No AV block or pauses No sustained arrhythmias Baseline artifact limits interpretation at times  Echocardiogram 2022/02/13:  1. Left ventricular ejection fraction, by estimation, is 60 to 65%. The left ventricle has normal function. The left ventricle has no regional wall motion abnormalities. Left ventricular diastolic parameters are consistent with Grade I diastolic  dysfunction (impaired relaxation).   2. Right ventricular systolic function is normal. The right ventricular size is normal. Tricuspid regurgitation signal is inadequate for assessing PA pressure.   3. The mitral valve is grossly normal. No evidence of mitral valve regurgitation.   4. The aortic valve is tricuspid. Aortic valve regurgitation is not visualized.   5. The inferior vena cava is normal in size with greater than 50% respiratory variability, suggesting right atrial pressure of 3 mmHg.  Stress Testing: Lexiscan (Walking) Tetrofosmin stress test 01/17/2021: Walking/Lexiscan nuclear stress test. 2-day rest/stress protocol.  Small size, mild intensity perfusion defect, suggestive of possible reversible perfusion defect involving apical anterior and apex (overall accuracy is limited due to soft tissue attenuation BMI 43.6, images taken in seated position).  Without convincing evidence of prior myocardial infarction. Calculated LVEF 75%, visually appears hyperdynamic.   Left ventricular size upper limit of normal, wall thickness preserved, and significant regional wall motion abnormalities. Clinical correlation required. No prior studies available for comparison. Low risk study.   Sleep study 11/05/2022: 1.  Severe obstructive Sleep Apnea (OSA) 2. Nocturnal Hypoxemia 3. Dysfunctions associated with sleep stages or arousal from  sleep 4. Non-specific abnormal electrocardiogram (EKG)  EKG:   EKG 08/22/2022: Normal sinus rhythm at the rate of 78 bpm, left atrial enlargement, normal axis.  No evidence of ischemia,  normal EKG.  No significant change from 11/21/2021.  Assessment   No diagnosis found.    No orders of the defined types were placed in this encounter.   No orders of the defined types were placed in this encounter.   There are no discontinued medications.    Recommendations:   Tamara Russell is a 62 y.o.  African-American female patient with primary hypertension, chronic diastolic heart failure, morbid obesity with severe OSA not on CPAP, restrictive lung disease secondary to obesity and chronic hypercarbic respiratory failure secondary to pickwickian syndrome, right lower extremity venous insufficiency related to right common femoral vein reflux not amenable for endovascular therapy.  Admitted in April 2023 with altered mental status, severe hypoxemic and hypercarbic respiratory failure.  Also developed DVT at that time.  Patient seen 4-5 weeks ago for hypertension management, management of chronic diastolic heart failure and dyspnea in which I discontinued hydrochlorothiazide and switch her to olmesartan HCT.  I had also discontinued Eliquis as a DVT had resolved.  She now presents for follow-up.  1. Chronic diastolic (congestive) heart failure (HCC) In the last 6 to 8 months, patient has lost about 50 pounds in weight.  She is not anymore in heart failure.  I encouraged her to continue to lose weight and to be active.  2. Dyspnea on exertion Shortness of breath is clearly improved with weight loss, overall patient feels well.  No further PND or orthopnea.  3. Primary hypertension Blood pressure is not well-controlled.  Continue present medical therapy.  As she is continue to lose weight, expect blood pressure to even get better hence I did not make any changes to her medications.  4. Leg swelling Leg edema has completely resolved.  Mild venous insufficiency edema has been stable especially involving the right leg, she is also compliant with her support stockings, I prescribed her  2 pairs of support stockings.  Patient's daughter is present, she is also being treated for what appears to be anxiety, depression and other psychiatric issues however patient herself denies all this and states that she does not want to be on any high-dose antidepressants or psychotropic agents.  Patient has not been Seroquel 50 mg twice daily, she has continued only with 25 mg in the evening.  She prefers to continue the same dose.  Advised her that I do not have expertise however overall from clinical standpoint she appears to be doing well, she is not planning on any harm for others or for herself and there has been no family issues as well.  I will see her back in 3 months and if she remains stable, I will see her back on a as needed basis.     Adrian Prows, MD, [facility] 12/20/2022, 6:50 AM Office: [phone]

## 2022-12-21 ENCOUNTER — Telehealth: Payer: Self-pay | Admitting: Family Medicine

## 2022-12-21 NOTE — Telephone Encounter (Signed)
Pt call and stated her daughter want her to change to dr.Hernandez

## 2022-12-21 NOTE — Telephone Encounter (Signed)
[place] with me if ok with Dr. Jerilee Hoh

## 2022-12-23 ENCOUNTER — Encounter: Payer: Self-pay | Admitting: Cardiology

## 2022-12-26 ENCOUNTER — Ambulatory Visit: Payer: 59 | Admitting: Podiatry

## 2022-12-26 IMAGING — DX DG KNEE 1-2V PORT*R*
2 series · 2 of 2 positions shown · non-contrast
Comparison: Right femur radiographs 08/08/2013

CLINICAL DATA: Right knee pain.

EXAM:
PORTABLE RIGHT KNEE - 1-2 VIEW

[knee ap]
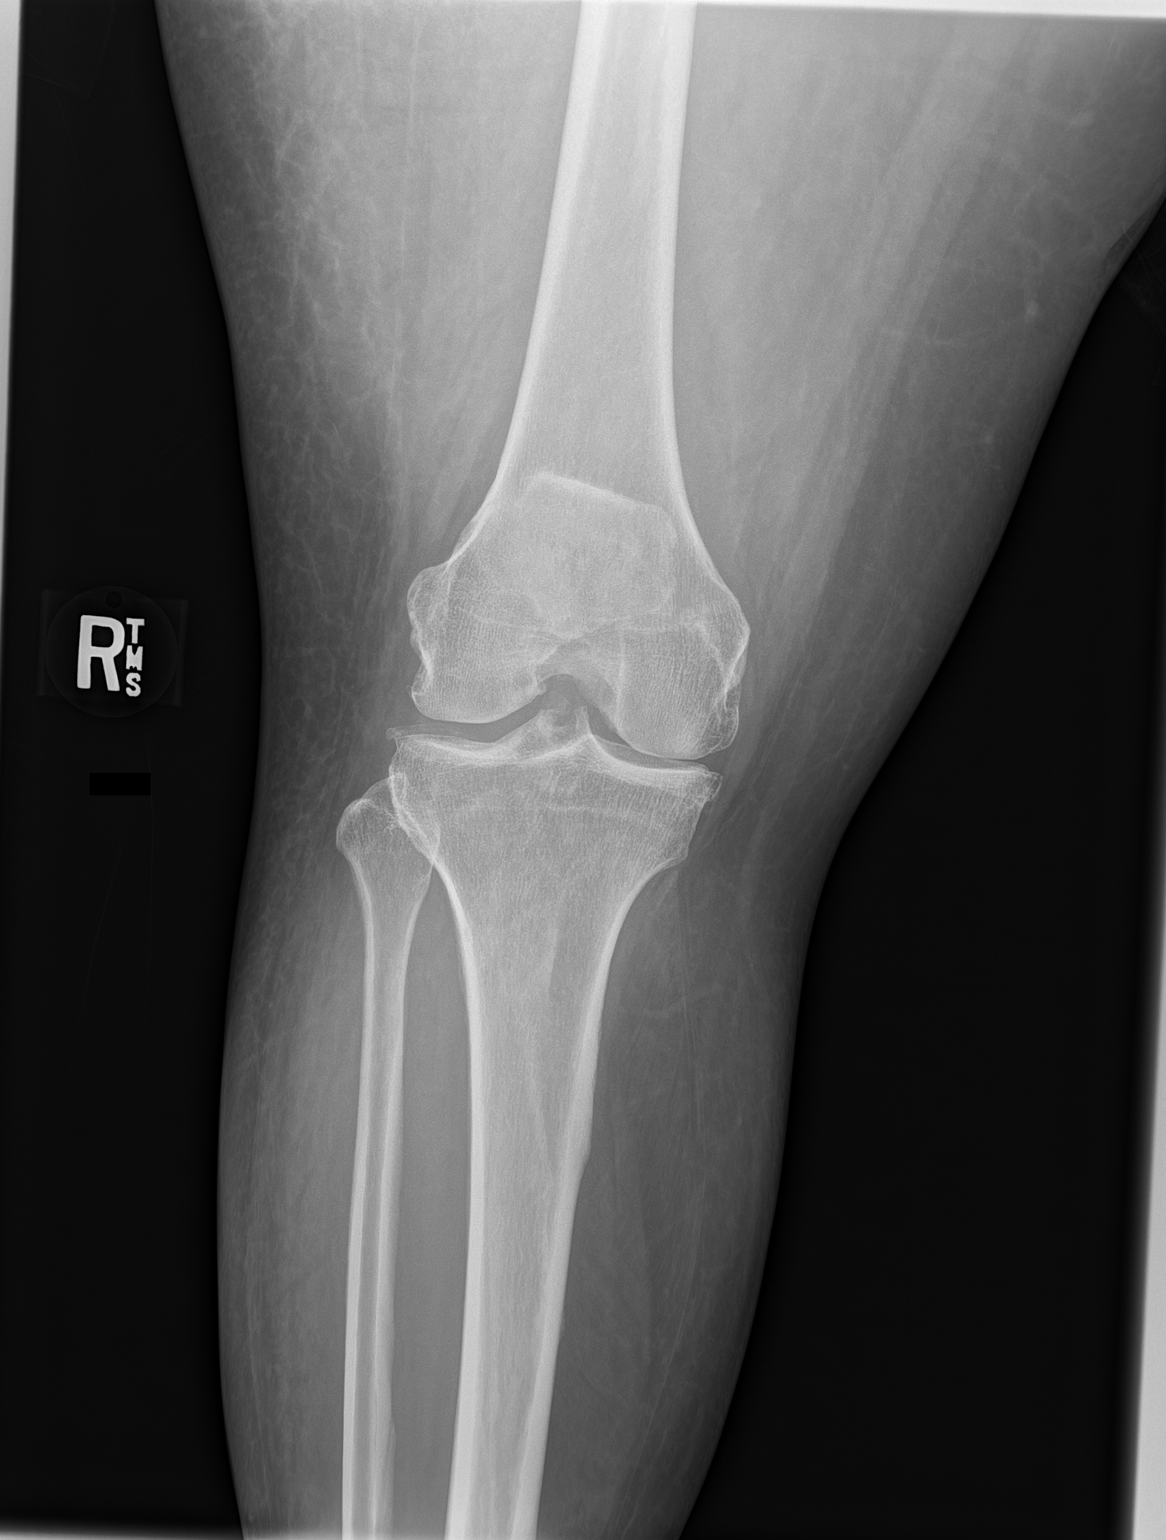

[knee lat]
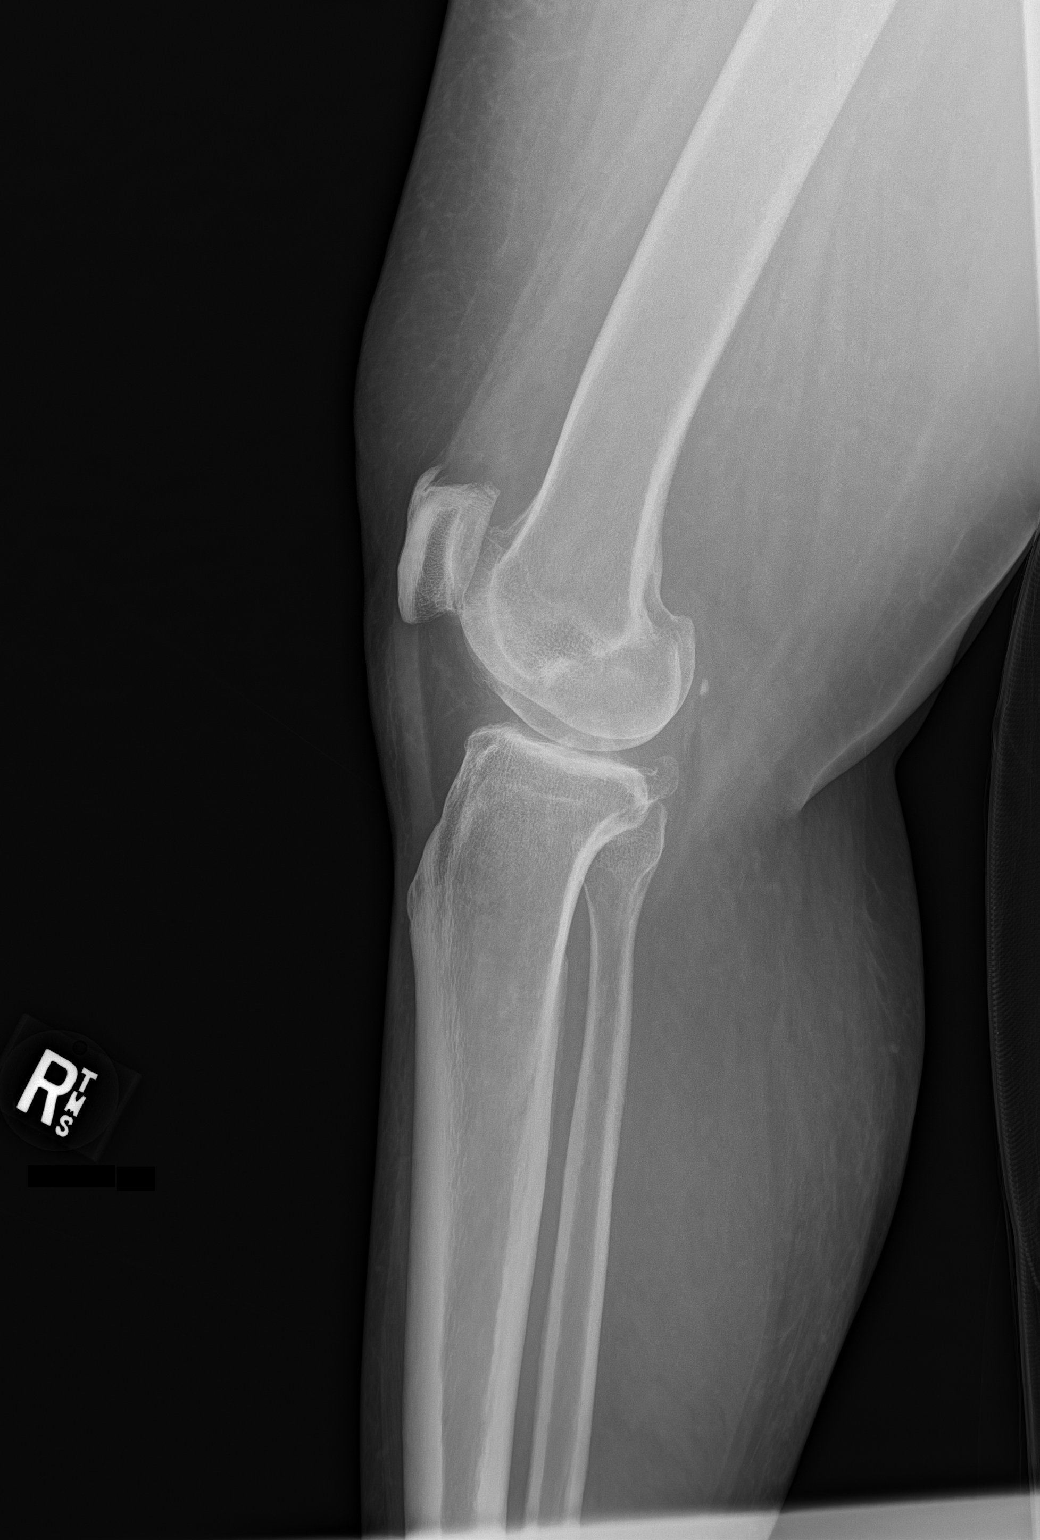

[2 of 2 positions shown; findings below may reference images not displayed]

FINDINGS: Moderate medial compartment joint space narrowing and mild
peripheral degenerative osteophytosis. Mild peripheral lateral
compartment degenerative osteophytosis. Mild patellofemoral joint
space narrowing and peripheral osteophytosis. Mild chronic
enthesopathic change at the quadriceps insertion on the patella,
similar to prior. No acute fracture is seen. No dislocation. No
joint effusion.
IMPRESSION: Moderate medial compartment and mild lateral and patellofemoral
compartment osteoarthritis, mildly progressed from prior remote
radiographs.

## 2022-12-28 ENCOUNTER — Ambulatory Visit (INDEPENDENT_AMBULATORY_CARE_PROVIDER_SITE_OTHER): Payer: 59 | Admitting: Podiatry

## 2022-12-28 DIAGNOSIS — B351 Tinea unguium: Secondary | ICD-10-CM

## 2022-12-28 DIAGNOSIS — M79675 Pain in left toe(s): Secondary | ICD-10-CM | POA: Diagnosis not present

## 2022-12-28 DIAGNOSIS — M79674 Pain in right toe(s): Secondary | ICD-10-CM

## 2022-12-29 IMAGING — DX DG CHEST 1V PORT
1 series · 1 of 1 positions shown · non-contrast
Comparison: 03/02/2022

CLINICAL DATA: Shortness of breath, low O2 sats

EXAM:
PORTABLE CHEST 1 VIEW

[chest ap]
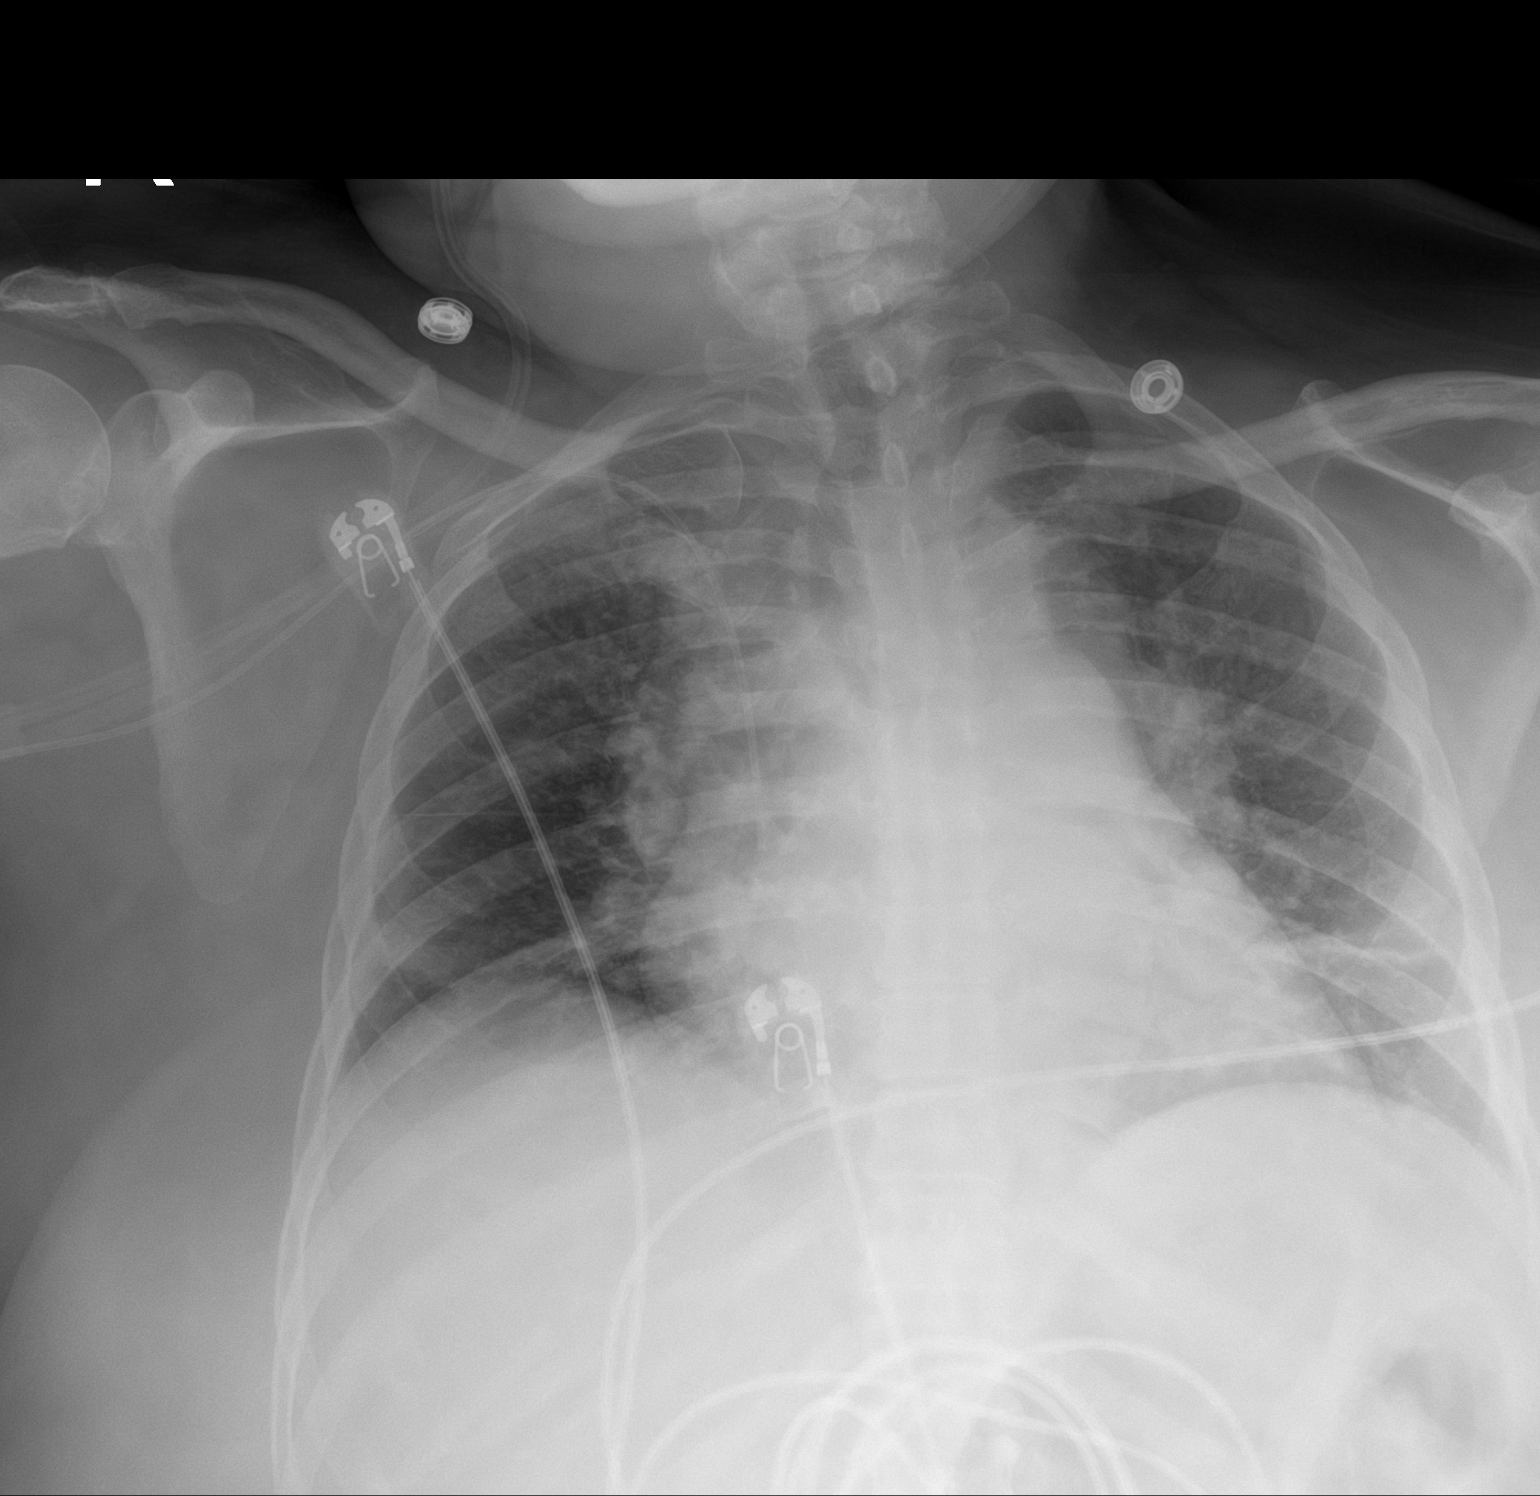

[1 of 1 positions shown; findings below may reference images not displayed]

FINDINGS: Right PICC line tip at the cavoatrial junction, unchanged. Heart is
borderline in size. Vascular congestion and bibasilar atelectasis.
No effusions or acute bony abnormality.
IMPRESSION: Mild vascular congestion, bibasilar atelectasis.

## 2023-01-01 NOTE — Progress Notes (Signed)
  Subjective:  Patient ID: Tamara Russell, female    DOB: 07-30-1961,  MRN: [id number]  Chief Complaint  Patient presents with   Nail Problem    painful thick nails    62 y.o. female presents with the above complaint. History confirmed with patient.  She is here again with her husband as well as her daughter today.  The nails are thick and thick and elongated again.    Objective:  Physical Exam: warm, good capillary refill, no trophic changes or ulcerative lesions, normal DP and PT pulses, normal sensory exam, and thickened elongated brown-yellow discolored toenails with subungual debris.      Assessment:   1. Pain due to onychomycosis of toenails of both feet       Plan:  Patient was evaluated and treated and all questions answered.  Discussed the etiology and treatment options for the condition in detail with the patient. Educated patient on the topical and oral treatment options for mycotic nails. Recommended debridement of the nails today. Sharp and mechanical debridement performed of all painful and mycotic nails today. Nails debrided in length and thickness using a nail nipper to level of comfort. Discussed treatment options including appropriate shoe gear. Follow up as needed for painful nails.        Return if symptoms worsen or fail to improve.

## 2023-01-03 DIAGNOSIS — I82409 Acute embolism and thrombosis of unspecified deep veins of unspecified lower extremity: Secondary | ICD-10-CM | POA: Diagnosis not present

## 2023-01-03 DIAGNOSIS — I5033 Acute on chronic diastolic (congestive) heart failure: Secondary | ICD-10-CM | POA: Diagnosis not present

## 2023-01-05 ENCOUNTER — Other Ambulatory Visit: Payer: Self-pay | Admitting: Family Medicine

## 2023-01-05 DIAGNOSIS — F5105 Insomnia due to other mental disorder: Secondary | ICD-10-CM

## 2023-01-09 ENCOUNTER — Telehealth: Payer: Self-pay

## 2023-01-09 NOTE — Telephone Encounter (Signed)
Patient called and wanted refill of her Olmesartan-HCTZ. I informed her that her PCP had changed it on 11/13/22 to [place] so I could not authorize refill. Patient says she was not aware of this change and wants you to send in Olmesartan-HCTZ because this is what she has been taking.

## 2023-01-09 NOTE — Telephone Encounter (Signed)
Yes she can go back to [facility]

## 2023-01-10 MED ORDER — OLMESARTAN MEDOXOMIL-HCTZ 40-25 MG PO TABS: 1.0000 | ORAL_TABLET | Freq: Every day | ORAL | 3 refills | Status: AC

## 2023-01-10 NOTE — Telephone Encounter (Signed)
Medication sent into pharmacy  

## 2023-01-18 DIAGNOSIS — E785 Hyperlipidemia, unspecified: Secondary | ICD-10-CM | POA: Diagnosis not present

## 2023-01-18 DIAGNOSIS — I10 Essential (primary) hypertension: Secondary | ICD-10-CM | POA: Diagnosis not present

## 2023-01-18 DIAGNOSIS — E1169 Type 2 diabetes mellitus with other specified complication: Secondary | ICD-10-CM | POA: Diagnosis not present

## 2023-02-03 DIAGNOSIS — I5033 Acute on chronic diastolic (congestive) heart failure: Secondary | ICD-10-CM | POA: Diagnosis not present

## 2023-02-03 DIAGNOSIS — I82409 Acute embolism and thrombosis of unspecified deep veins of unspecified lower extremity: Secondary | ICD-10-CM | POA: Diagnosis not present

## 2023-02-09 DIAGNOSIS — G4733 Obstructive sleep apnea (adult) (pediatric): Secondary | ICD-10-CM | POA: Diagnosis not present

## 2023-02-12 ENCOUNTER — Telehealth: Payer: Self-pay

## 2023-02-12 NOTE — Telephone Encounter (Signed)
LVM to bring CPAP machine with power cord.

## 2023-02-13 ENCOUNTER — Ambulatory Visit: Payer: 59 | Admitting: Neurology

## 2023-03-05 DIAGNOSIS — I5033 Acute on chronic diastolic (congestive) heart failure: Secondary | ICD-10-CM | POA: Diagnosis not present

## 2023-03-05 DIAGNOSIS — I82409 Acute embolism and thrombosis of unspecified deep veins of unspecified lower extremity: Secondary | ICD-10-CM | POA: Diagnosis not present

## 2023-03-11 DIAGNOSIS — G4733 Obstructive sleep apnea (adult) (pediatric): Secondary | ICD-10-CM | POA: Diagnosis not present

## 2023-03-14 ENCOUNTER — Encounter: Payer: Self-pay | Admitting: Neurology

## 2023-03-14 ENCOUNTER — Telehealth: Payer: Self-pay | Admitting: Neurology

## 2023-03-14 NOTE — Telephone Encounter (Signed)
Unable to reach pt over the phone, no VM box. Sent letter in mail informing pt of need to reschedule 04/30/23 appt - MD out

## 2023-03-26 ENCOUNTER — Ambulatory Visit: Payer: 59 | Admitting: Family Medicine

## 2023-04-05 DIAGNOSIS — I5033 Acute on chronic diastolic (congestive) heart failure: Secondary | ICD-10-CM | POA: Diagnosis not present

## 2023-04-05 DIAGNOSIS — I82409 Acute embolism and thrombosis of unspecified deep veins of unspecified lower extremity: Secondary | ICD-10-CM | POA: Diagnosis not present

## 2023-04-11 DIAGNOSIS — G4733 Obstructive sleep apnea (adult) (pediatric): Secondary | ICD-10-CM | POA: Diagnosis not present

## 2023-04-29 ENCOUNTER — Ambulatory Visit (HOSPITAL_COMMUNITY)
Admission: EM | Admit: 2023-04-29 | Discharge: 2023-04-29 | Disposition: A | Discharge status: Home / Self Care | Payer: Self-pay | Attending: Emergency Medicine | Admitting: Emergency Medicine

## 2023-04-29 ENCOUNTER — Encounter (HOSPITAL_COMMUNITY): Payer: Self-pay | Admitting: *Deleted

## 2023-04-29 DIAGNOSIS — U071 COVID-19: Secondary | ICD-10-CM | POA: Insufficient documentation

## 2023-04-29 NOTE — ED Provider Notes (Signed)
[facility]    CSN: [id number] Arrival date & time: 04/29/23  1352     History   Chief Complaint Chief Complaint  Patient presents with   Covid Positive   Nasal Congestion    HPI Tamara Russell is a 63 y.o. female.  3 days ago she had some body aches and nasal congestion.  This morning tested positive for COVID.  Reports her congestion is already improving. No medications taken  She has no other symptoms.  No fevers or cough.  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" Her husband works at a college, no known sick contacts there  Past Medical History:  Diagnosis Date   Acid reflux    Chronic diastolic heart failure (HCC)    Chronic respiratory failure with hypoxia and hypercapnia (HCC)    on 2 L Drain continuous   Complication of anesthesia    slow to wake up   DVT (deep venous thrombosis) (HCC)    of LLE ; dx'ed on 03/05/22   History of COVID-19    HLD (hyperlipidemia)    Hypertension     Patient Active Problem List   Diagnosis Date Noted   External hemorrhoids without complication 08/10/2022   Chronic venous insufficiency 07/04/2022   HLD (hyperlipidemia)    Acute encephalopathy 04/15/2022   DVT (deep venous thrombosis) (HCC) 03/05/2022   Ileus (HCC) 03/04/2022   OSA (obstructive sleep apnea) 03/04/2022   Encephalopathy    Acute metabolic encephalopathy 02/06/2022   Chronic iron deficiency anemia 02/06/2022   Acute on chronic diastolic CHF (congestive heart failure) (HCC) 02/06/2022   Bradycardia    Sinus pause    Hypokalemia    Hypomagnesemia    Chronic heart failure with preserved ejection fraction (HFpEF) (HCC)    Elevated troponin level not due to acute coronary syndrome    Obesity, Class III, BMI 40-49.9 (morbid obesity) (HCC)    GI bleed 11/19/2018   Hypertension    Acute gastric ulcer with hemorrhage    PUD (peptic ulcer disease)    GERD (gastroesophageal reflux disease) 11/19/2015     Past Surgical History:  Procedure Laterality Date   ABDOMINAL HYSTERECTOMY     BIOPSY  11/17/2018   Procedure: BIOPSY;  Surgeon: Beverley Fiedler, MD;  Location: [facility];  Service: Gastroenterology;;   BREAST EXCISIONAL BIOPSY Left    benign   BREAST EXCISIONAL BIOPSY Right    benign   ESOPHAGOGASTRODUODENOSCOPY (EGD) WITH PROPOFOL N/A 11/17/2018   Procedure: ESOPHAGOGASTRODUODENOSCOPY (EGD) WITH PROPOFOL;  Surgeon: Beverley Fiedler, MD;  Location: [facility];  Service: Gastroenterology;  Laterality: N/A;    OB History   No obstetric history on file.      Home Medications    Prior to Admission medications   Medication Sig Start Date End Date Taking? Authorizing Provider  amLODipine (NORVASC) 10 MG tablet Take 10 mg by mouth daily.   Yes [provider]  ferrous sulfate 324 MG TBEC Take 324 mg by mouth every morning.   Yes [provider]  metoprolol succinate (TOPROL-XL) 25 MG 24 hr tablet Take 1 tablet (25 mg total) by mouth daily. 03/18/22 04/29/23 Yes Arrien, York Ram, MD  Multiple Vitamins-Minerals (CENTRUM SILVER ADULT 50+ PO) Take 1 tablet by mouth daily.   Yes [provider]  olmesartan-hydrochlorothiazide (BENICAR HCT) 40-25 MG tablet Take 1 tablet by mouth daily. 01/10/23  Yes Yates Decamp, MD    Family History  Family History  Problem Relation Age of Onset   Cancer Father 36       stomach   Hypertension Father    Diabetes Maternal Grandmother    Colon cancer Maternal Grandfather    Diabetes Paternal Grandmother    Colon cancer Paternal Grandmother     Social History Social History   Tobacco Use   Smoking status: Former    Current packs/day: 0.00    Average packs/day: 0.5 packs/day for 20.0 years (10.0 ttl pk-yrs)    Types: Cigarettes    Start date: 04/30/1975    Quit date: 04/30/1995    Years since quitting: 28.0   Smokeless tobacco: Never  Vaping Use   Vaping status: Never Used  Substance Use Topics   Alcohol use: No     Alcohol/week: 0.0 standard drinks of alcohol   Drug use: No     Allergies   Penicillins   Review of Systems Review of Systems Per HPI  Physical Exam Triage Vital Signs ED Triage Vitals  Encounter Vitals Group     BP 04/29/23 1521 (!) 161/81     Systolic BP Percentile --      Diastolic BP Percentile --      Pulse Rate 04/29/23 1521 62     Resp 04/29/23 1521 18     Temp 04/29/23 1521 98.2 F (36.8 C)     Temp Source 04/29/23 1521 Oral     SpO2 04/29/23 1521 96 %     Weight --      Height --      Head Circumference --      Peak Flow --      Pain Score 04/29/23 1519 0     Pain Loc --      Pain Education --      Exclude from Growth Chart --    No data found.  Updated Vital Signs BP (!) 161/81 (BP Location: Left Arm)   Pulse 62   Temp 98.2 F (36.8 C) (Oral)   Resp 18   SpO2 96%    Physical Exam Vitals and nursing note reviewed.  Constitutional:      General: She is not in acute distress. HENT:     Nose: No congestion or rhinorrhea.     Mouth/Throat:     Mouth: Mucous membranes are moist.     Pharynx: Oropharynx is clear. No posterior oropharyngeal erythema.  Eyes:     Conjunctiva/sclera: Conjunctivae normal.  Cardiovascular:     Rate and Rhythm: Normal rate and regular rhythm.     Heart sounds: Normal heart sounds.  Pulmonary:     Effort: Pulmonary effort is normal. No respiratory distress.     Breath sounds: Normal breath sounds. No wheezing.  Musculoskeletal:     Cervical back: Normal range of motion.  Skin:    General: Skin is warm and dry.  Neurological:     Mental Status: She is alert and oriented to person, place, and time.     UC Treatments / Results  Labs (all labs ordered are listed, but only abnormal results are displayed) Labs Reviewed  SARS CORONAVIRUS 2 (TAT 6-24 HRS)    EKG   Radiology No results found.  Procedures Procedures (including critical care time)  Medications Ordered in UC Medications - No data to  display  Initial Impression / Assessment and Plan / UC Course  I have reviewed the triage vital signs and the nursing notes.  Pertinent labs & imaging results that were  available during my care of the patient were reviewed by me and considered in my medical decision making (see chart for details).  Afebrile and well appearing. Patient reporting she already feels much better. Discussed with the positive home test she likely does have COVID.  Patient would like to confirm with a PCR.  Pending.  Discussed her symptoms are likely viral in nature and is treated with symptomatic care.  Discussed she can try Mucinex if needed for congestion.  Increasing fluids.  Monitoring for worsening of symptoms and returning if needed.  Patient is agreeable to plan.  All questions answered.  Final Clinical Impressions(s) / UC Diagnoses   Final diagnoses:  Positive self-administered antigen test for COVID-19     Discharge Instructions      We will call you if your covid test returns positive.  I suspect it will be given your home test. No matter what you likely have a virus which is treated with symptomatic care.  As discussed I recommend trying the Mucinex.  Drink lots of fluids.  You can return if needed    ED Prescriptions   None    PDMP not reviewed this encounter.   Marlow Baars, [place] 04/29/23 1616

## 2023-04-29 NOTE — Discharge Instructions (Addendum)
We will call you if your covid test returns positive.  I suspect it will be given your home test. No matter what you likely have a virus which is treated with symptomatic care.  As discussed I recommend trying the Mucinex.  Drink lots of fluids.  You can return if needed

## 2023-04-29 NOTE — ED Triage Notes (Signed)
Pt states she has congestion x 3 days. She took a at home COVID test at home and it was positive today.

## 2023-04-30 ENCOUNTER — Ambulatory Visit: Payer: 59 | Admitting: Neurology

## 2023-04-30 LAB — SARS CORONAVIRUS 2 (TAT 6-24 HRS): SARS Coronavirus 2: POSITIVE — AB

## 2023-06-07 ENCOUNTER — Other Ambulatory Visit: Payer: Self-pay | Admitting: Family Medicine

## 2023-06-07 DIAGNOSIS — F5105 Insomnia due to other mental disorder: Secondary | ICD-10-CM

## 2023-07-04 NOTE — Telephone Encounter (Signed)
This encounter was created in error - please disregard.

## 2023-09-12 ENCOUNTER — Other Ambulatory Visit: Payer: Self-pay | Admitting: Family Medicine

## 2023-09-12 DIAGNOSIS — F5105 Insomnia due to other mental disorder: Secondary | ICD-10-CM

## 2023-09-22 DIAGNOSIS — I739 Peripheral vascular disease, unspecified: Secondary | ICD-10-CM | POA: Diagnosis not present

## 2023-09-22 DIAGNOSIS — K922 Gastrointestinal hemorrhage, unspecified: Secondary | ICD-10-CM | POA: Diagnosis not present

## 2023-09-22 DIAGNOSIS — E786 Lipoprotein deficiency: Secondary | ICD-10-CM | POA: Diagnosis not present

## 2023-09-22 DIAGNOSIS — I119 Hypertensive heart disease without heart failure: Secondary | ICD-10-CM | POA: Diagnosis not present

## 2023-09-22 DIAGNOSIS — E1169 Type 2 diabetes mellitus with other specified complication: Secondary | ICD-10-CM | POA: Diagnosis not present

## 2023-09-22 DIAGNOSIS — E782 Mixed hyperlipidemia: Secondary | ICD-10-CM | POA: Diagnosis not present

## 2023-09-22 DIAGNOSIS — R635 Abnormal weight gain: Secondary | ICD-10-CM | POA: Diagnosis not present

## 2023-10-08 DIAGNOSIS — F22 Delusional disorders: Secondary | ICD-10-CM | POA: Diagnosis not present

## 2023-10-08 DIAGNOSIS — F23 Brief psychotic disorder: Secondary | ICD-10-CM | POA: Diagnosis not present

## 2023-10-08 DIAGNOSIS — Z809 Family history of malignant neoplasm, unspecified: Secondary | ICD-10-CM | POA: Diagnosis not present

## 2023-10-08 DIAGNOSIS — E785 Hyperlipidemia, unspecified: Secondary | ICD-10-CM | POA: Diagnosis not present

## 2023-10-08 DIAGNOSIS — I4891 Unspecified atrial fibrillation: Secondary | ICD-10-CM | POA: Diagnosis not present

## 2023-10-08 DIAGNOSIS — Z86718 Personal history of other venous thrombosis and embolism: Secondary | ICD-10-CM | POA: Diagnosis not present

## 2023-10-08 DIAGNOSIS — G2581 Restless legs syndrome: Secondary | ICD-10-CM | POA: Diagnosis not present

## 2023-10-08 DIAGNOSIS — I110 Hypertensive heart disease with heart failure: Secondary | ICD-10-CM | POA: Diagnosis not present

## 2023-10-08 DIAGNOSIS — Z833 Family history of diabetes mellitus: Secondary | ICD-10-CM | POA: Diagnosis not present

## 2023-10-08 DIAGNOSIS — G4733 Obstructive sleep apnea (adult) (pediatric): Secondary | ICD-10-CM | POA: Diagnosis not present

## 2023-10-08 DIAGNOSIS — Z8249 Family history of ischemic heart disease and other diseases of the circulatory system: Secondary | ICD-10-CM | POA: Diagnosis not present

## 2023-12-15 ENCOUNTER — Other Ambulatory Visit: Payer: Self-pay

## 2023-12-15 ENCOUNTER — Encounter (HOSPITAL_COMMUNITY): Payer: Self-pay | Admitting: Emergency Medicine

## 2023-12-15 ENCOUNTER — Emergency Department (HOSPITAL_COMMUNITY)

## 2023-12-15 ENCOUNTER — Emergency Department (HOSPITAL_COMMUNITY)
Admission: EM | Admit: 2023-12-15 | Discharge: 2023-12-15 | Disposition: A | Discharge status: Home / Self Care | Attending: Emergency Medicine | Admitting: Emergency Medicine

## 2023-12-15 DIAGNOSIS — R55 Syncope and collapse: Secondary | ICD-10-CM | POA: Insufficient documentation

## 2023-12-15 DIAGNOSIS — I509 Heart failure, unspecified: Secondary | ICD-10-CM | POA: Diagnosis not present

## 2023-12-15 DIAGNOSIS — E876 Hypokalemia: Secondary | ICD-10-CM | POA: Insufficient documentation

## 2023-12-15 DIAGNOSIS — Z79899 Other long term (current) drug therapy: Secondary | ICD-10-CM | POA: Insufficient documentation

## 2023-12-15 DIAGNOSIS — I110 Hypertensive heart disease with heart failure: Secondary | ICD-10-CM | POA: Insufficient documentation

## 2023-12-15 LAB — COMPREHENSIVE METABOLIC PANEL
ALT: 14 U/L (ref 0–44)
AST: 24 U/L (ref 15–41)
Albumin: 3.4 g/dL — ABNORMAL LOW (ref 3.5–5.0)
Alkaline Phosphatase: 72 U/L (ref 38–126)
Anion gap: 14 (ref 5–15)
BUN: 5 mg/dL — ABNORMAL LOW (ref 8–23)
CO2: 34 mmol/L — ABNORMAL HIGH (ref 22–32)
Calcium: 9.3 mg/dL (ref 8.9–10.3)
Chloride: 87 mmol/L — ABNORMAL LOW (ref 98–111)
Creatinine, Ser: 0.54 mg/dL (ref 0.44–1.00)
GFR, Estimated: >60 mL/min (ref >60)
Glucose, Bld: 143 mg/dL — ABNORMAL HIGH (ref 70–99)
Potassium: 3 mmol/L — ABNORMAL LOW (ref 3.5–5.1)
Sodium: 135 mmol/L (ref 135–145)
Total Bilirubin: 0.5 mg/dL (ref 0.0–1.2)
Total Protein: 7.2 g/dL (ref 6.5–8.1)

## 2023-12-15 LAB — CBC WITH DIFFERENTIAL/PLATELET
Abs Immature Granulocytes: 0.05 10*3/uL (ref 0.00–0.07)
Basophils Absolute: 0 10*3/uL (ref 0.0–0.1)
Basophils Relative: 0 %
Eosinophils Absolute: 0 10*3/uL (ref 0.0–0.5)
Eosinophils Relative: 1 %
HCT: 46.1 % — ABNORMAL HIGH (ref 36.0–46.0)
Hemoglobin: 14.3 g/dL (ref 12.0–15.0)
Immature Granulocytes: 1 %
Lymphocytes Relative: 17 %
Lymphs Abs: 0.7 10*3/uL (ref 0.7–4.0)
MCH: 24.7 pg — ABNORMAL LOW (ref 26.0–34.0)
MCHC: 31 g/dL (ref 30.0–36.0)
MCV: 79.8 fL — ABNORMAL LOW (ref 80.0–100.0)
Monocytes Absolute: 0.1 10*3/uL (ref 0.1–1.0)
Monocytes Relative: 3 %
Neutro Abs: 3.5 10*3/uL (ref 1.7–7.7)
Neutrophils Relative %: 78 %
Platelets: 264 10*3/uL (ref 150–400)
RBC: 5.78 MIL/uL — ABNORMAL HIGH (ref 3.87–5.11)
RDW: 15.2 % (ref 11.5–15.5)
WBC: 4.5 10*3/uL (ref 4.0–10.5)
nRBC: 0 % (ref 0.0–0.2)

## 2023-12-15 LAB — URINALYSIS, ROUTINE W REFLEX MICROSCOPIC
Bilirubin Urine: NEGATIVE
Glucose, UA: NEGATIVE mg/dL
Hgb urine dipstick: NEGATIVE
Ketones, ur: NEGATIVE mg/dL
Leukocytes,Ua: NEGATIVE
Nitrite: NEGATIVE
Protein, ur: NEGATIVE mg/dL
Specific Gravity, Urine: 1.013 (ref 1.005–1.030)
pH: 6 (ref 5.0–8.0)

## 2023-12-15 LAB — RESP PANEL BY RT-PCR (RSV, FLU A&B, COVID)  RVPGX2
Influenza A by PCR: NEGATIVE
Influenza B by PCR: NEGATIVE
Resp Syncytial Virus by PCR: NEGATIVE
SARS Coronavirus 2 by RT PCR: NEGATIVE

## 2023-12-15 LAB — CBG MONITORING, ED: Glucose-Capillary: 129 mg/dL — ABNORMAL HIGH (ref 70–99)

## 2023-12-15 LAB — LACTIC ACID, PLASMA: Lactic Acid, Venous: 1.7 mmol/L (ref 0.5–1.9)

## 2023-12-15 MED ORDER — POTASSIUM CHLORIDE CRYS ER 20 MEQ PO TBCR: 40.0000 meq | EXTENDED_RELEASE_TABLET | Freq: Once | ORAL | Status: DC

## 2023-12-15 NOTE — Discharge Instructions (Addendum)
 Call Monday to make an appointment for close follow-up with your primary care doctor.  Return to the emergency room if you have any worsening symptoms.

## 2023-12-15 NOTE — ED Provider Notes (Signed)
 [place] EMERGENCY DEPARTMENT AT [facility] Provider Note   CSN: [id number] Arrival date & time: 12/15/23  1010     History  Chief Complaint  Patient presents with   Near Syncope   Altered Mental Status    Tamara Russell is a 63 y.o. female.  Patient is a 63 year old female who presents with altered mental status/syncope.  She has a history of CHF, hypertension, hyperlipidemia, prior DVT.  Per EMS, patient was laying on bed about to get up and had a syncopal episode as she was getting up.  She said she felt hot prior to this episode.  She was reportedly diaphoretic per the husband.  She was assisted to the ground.  Reportedly she did not hit her head although she scraped her neck under her chin on the bedside table.  She then was assisted to the living room by crawling on the floor and put on the sofa.  She said she is feeling a bit better now.  She said she was feeling okay yesterday and during the night when she walked to the bathroom.  She did not have any palpitations or associated chest pain.  No shortness of breath.  No cough or cold symptoms.  No known fevers.  No vomiting or diarrhea.  No urinary symptoms.  No witnessed seizure type activity.  No speech deficits or vision changes.  No numbness or weakness to extremities other than generalized weakness.       Home Medications Prior to Admission medications   Medication Sig Start Date End Date Taking? Authorizing Provider  amLODipine (NORVASC) 10 MG tablet Take 10 mg by mouth daily.    [provider]  ferrous sulfate 324 MG TBEC Take 324 mg by mouth every morning.    [provider]  metoprolol succinate (TOPROL-XL) 25 MG 24 hr tablet Take 1 tablet (25 mg total) by mouth daily. 03/18/22 04/29/23  Arrien, York Ram, MD  Multiple Vitamins-Minerals (CENTRUM SILVER ADULT 50+ PO) Take 1 tablet by mouth daily.    [provider]  olmesartan-hydrochlorothiazide (BENICAR HCT) 40-25 MG  tablet Take 1 tablet by mouth daily. 01/10/23   Yates Decamp, MD  QUEtiapine (SEROQUEL) 25 MG tablet TAKE 1 TABLET BY MOUTH EVERYDAY AT BEDTIME 09/12/23   Karie Georges, MD      Allergies    Penicillins    Review of Systems   Review of Systems  Constitutional:  Positive for fatigue. Negative for chills, diaphoresis and fever.  HENT:  Negative for congestion, rhinorrhea and sneezing.   Eyes: Negative.   Respiratory:  Negative for cough, chest tightness and shortness of breath.   Cardiovascular:  Negative for chest pain and leg swelling.  Gastrointestinal:  Negative for abdominal pain, blood in stool, diarrhea, nausea and vomiting.  Genitourinary:  Negative for difficulty urinating, flank pain, frequency and hematuria.  Musculoskeletal:  Negative for arthralgias and back pain.  Skin:  Negative for rash.  Neurological:  Positive for syncope and weakness (Generalized). Negative for dizziness, speech difficulty, numbness and headaches.    Physical Exam Updated Vital Signs BP (!) 154/75   Pulse 77   Temp 97.6 F (36.4 C) (Oral)   Resp (!) 21   SpO2 93%  Physical Exam Constitutional:      Appearance: She is well-developed.  HENT:     Head: Normocephalic and atraumatic.  Eyes:     Pupils: Pupils are equal, round, and reactive to light.  Cardiovascular:     Rate  and Rhythm: Normal rate and regular rhythm.     Heart sounds: Normal heart sounds.  Pulmonary:     Effort: Pulmonary effort is normal. No respiratory distress.     Breath sounds: Normal breath sounds. No wheezing or rales.  Chest:     Chest wall: No tenderness.  Abdominal:     General: Bowel sounds are normal.     Palpations: Abdomen is soft.     Tenderness: There is no abdominal tenderness. There is no guarding or rebound.  Musculoskeletal:        General: Normal range of motion.     Cervical back: Normal range of motion and neck supple.  Lymphadenopathy:     Cervical: No cervical adenopathy.  Skin:    General:  Skin is warm and dry.     Findings: No rash.  Neurological:     General: No focal deficit present.     Mental Status: She is alert and oriented to person, place, and time.     ED Results / Procedures / Treatments   Labs (all labs ordered are listed, but only abnormal results are displayed) Labs Reviewed  CBC WITH DIFFERENTIAL/PLATELET - Abnormal; Notable for the following components:      Result Value   RBC 5.78 (*)    HCT 46.1 (*)    MCV 79.8 (*)    MCH 24.7 (*)    All other components within normal limits  COMPREHENSIVE METABOLIC PANEL - Abnormal; Notable for the following components:   Potassium 3.0 (*)    Chloride 87 (*)    CO2 34 (*)    Glucose, Bld 143 (*)    BUN 5 (*)    Albumin 3.4 (*)    All other components within normal limits  CBG MONITORING, ED - Abnormal; Notable for the following components:   Glucose-Capillary 129 (*)    All other components within normal limits  RESP PANEL BY RT-PCR (RSV, FLU A&B, COVID)  RVPGX2  URINALYSIS, ROUTINE W REFLEX MICROSCOPIC  LACTIC ACID, PLASMA    EKG EKG Interpretation Date/Time:  Saturday December 15 2023 10:45:51 EST Ventricular Rate:  61 PR Interval:  186 QRS Duration:  92 QT Interval:  439 QTC Calculation: 443 R Axis:   15  Text Interpretation: Sinus rhythm Atrial premature complex since last tracing no significant change Confirmed by Rolan Bucco [phone]) on 12/15/2023 12:22:54 PM  Radiology DG Chest 2 View Result Date: 12/15/2023 CLINICAL DATA:  Altered mental status.  Syncopal episode. EXAM: CHEST - 2 VIEW COMPARISON:  Radiographs 04/15/2022 and 03/10/2022. Chest CT 02/24/2022. FINDINGS: Stable cardiomegaly and vascular congestion without overt pulmonary edema, confluent airspace disease or significant pleural effusion. There is no pneumothorax. Mild degenerative changes in the spine. Telemetry leads overlie the chest. IMPRESSION: Cardiomegaly and vascular congestion without overt pulmonary edema. Electronically Signed    By: Carey Bullocks M.D.   On: 12/15/2023 11:54   CT Head Wo Contrast Result Date: 12/15/2023 CLINICAL DATA:  Minor head trauma EXAM: CT HEAD WITHOUT CONTRAST TECHNIQUE: Contiguous axial images were obtained from the base of the skull through the vertex without intravenous contrast. RADIATION DOSE REDUCTION: This exam was performed according to the departmental dose-optimization program which includes automated exposure control, adjustment of the mA and/or kV according to patient size and/or use of iterative reconstruction technique. COMPARISON:  04/12/2022 FINDINGS: Brain: No evidence of acute infarction, hemorrhage, hydrocephalus, extra-axial collection or mass lesion/mass effect. Mild low-density in the cerebral white matter, stable and likely chronic small  vessel disease. Vascular: No hyperdense vessel or unexpected calcification. Skull: Normal. Negative for fracture or focal lesion. Sinuses/Orbits: No acute finding. IMPRESSION: No evidence of intracranial injury. Electronically Signed   By: Tiburcio Pea M.D.   On: 12/15/2023 11:17    Procedures Procedures    Medications Ordered in ED Medications  potassium chloride SA (KLOR-CON M) CR tablet 40 mEq (has no administration in time range)    ED Course/ Medical Decision Making/ A&P                                 Medical Decision Making Amount and/or Complexity of Data Reviewed Labs: ordered. Radiology: ordered.  Risk Prescription drug management.   Patient is a 63 year old who presents after a syncopal type event.  She says now that she felt like she got too hot in bed and she had to many covers on.  She says she did not drink much before she went to bed and thinks that that was the reason she passed out.  She does not have any apparent injuries.  She did not have a fall after the event.  Her EKG does not show any ischemic changes or arrhythmias.  Her labs are nonconcerning other than she had a mild drop in her potassium.  She was  given some oral potassium replacement.  Head CT does not show any acute abnormality.  Chest x-ray showed some mild vascular congestion.  She is not short of breath.  No hypoxia.  She was monitored here for several hours in the ED and had ongoing improving symptoms.  She is able to ambulate with a walker which is what she normally does at home.  She does not have any current symptoms.  She does not have any ongoing dizziness.  No neurologic deficits or concerns for stroke.  She was discharged home in good condition.  She was advised to have close follow-up with her PCP.  Return precautions were given.  Final Clinical Impression(s) / ED Diagnoses Final diagnoses:  Syncope, unspecified syncope type  Hypokalemia    Rx / DC Orders ED Discharge Orders     None         Rolan Bucco, MD 12/15/23 1544

## 2023-12-15 NOTE — ED Triage Notes (Signed)
 Pt BIB from Home for syncopal episode, fell out of bed onto  a bedside table, assisted to ground by husband per husband.  Crawled to LIving room and helped to couch by family.  Pt was diaphoretic per family.  Pt complains of being tired.  ECG NS.  Oriented x4 per EMS. Negative stroke screen for EMS.   170/80 CBG 185 98% on scene, 87% en route, 96% 2L en route, 96% RA on arrival, HR  70  20G RA

## 2023-12-15 NOTE — ED Notes (Signed)
 Pt is lethargic but arousable and oriented when awake.

## 2024-03-14 ENCOUNTER — Ambulatory Visit: Admitting: Family Medicine

## 2024-03-25 ENCOUNTER — Ambulatory Visit: Admitting: Family Medicine

## 2024-03-27 ENCOUNTER — Ambulatory Visit: Admitting: Podiatry

## 2024-04-07 ENCOUNTER — Telehealth: Payer: Self-pay | Admitting: Family Medicine

## 2024-04-08 ENCOUNTER — Ambulatory Visit: Admitting: Family Medicine

## 2024-04-08 ENCOUNTER — Telehealth: Payer: Self-pay | Admitting: Podiatry

## 2024-04-08 NOTE — Telephone Encounter (Signed)
 Pt asked if paperwork or a prescription can be filled out by Dr. Silva for a motorized scooter. Will she need to be seen for it to be completed or can it be ready for her to be picked up?

## 2024-04-08 NOTE — Telephone Encounter (Signed)
 LVM letting pt know that a Rx for a motorized wheelchair will need to be provided by her PCP. Appt that was scheduled for 7/22 has been canceled as well. Vm left states that as well.

## 2024-05-06 ENCOUNTER — Ambulatory Visit: Admitting: Podiatry

## 2024-05-15 ENCOUNTER — Ambulatory Visit: Admitting: Podiatry

## 2024-05-15 VITALS — Ht 62.0 in | Wt 190.0 lb

## 2024-05-15 DIAGNOSIS — R269 Unspecified abnormalities of gait and mobility: Secondary | ICD-10-CM

## 2024-05-15 DIAGNOSIS — M21371 Foot drop, right foot: Secondary | ICD-10-CM | POA: Diagnosis not present

## 2024-05-15 NOTE — Progress Notes (Signed)
  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 Patient states right foot has a feeling of sand  in the right foot. Patient is able to flex right toes, but has difficulty walking on right foot.    Discussed the use of AI scribe software for clinical note transcription with the patient, who gave verbal consent to proceed.  History of Present Illness Tamara Russell is a 63 year old female who presents with difficulty walking and brace discomfort.  She experiences ongoing issues with her right foot, particularly difficulty walking without shoes and discomfort when wearing a brace. The brace, prescribed a few years ago, now causes pain at the end of her foot. She is able to walk around the house in socks and is fine when wearing shoes, but experiences difficulty walking without them. She does not trip on her feet and can drive without issues.  She is concerned about the long-term nature of her condition, which she attributes to nerve damage that occurred during a hospital stay at [facility]. She recalls being given a 'depression sock' during a previous visit, which she found helpful, but is unable to find them now.  She also mentions issues with thick and elongated nails on her right foot, which she manages by soaking them at home.      Objective:    Physical Exam VASCULAR: DP and PT pulse palpable. Foot is warm and well-perfused. Capillary fill time is brisk. DERMATOLOGIC: Thick and elongated mycotic nails on right foot. Normal skin turgor, texture, and temperature. No open lesions, rashes, or ulcerations. NEUROLOGIC: Normal sensation to light touch and pressure. No paresthesias on examination. ORTHOPEDIC: 4/5 strength in dorsiflexion, 5/5 in other planes. Smooth pain-free range of motion of all examined joints. No  ecchymosis or bruising. No gross deformity. No pain to palpation.   No images are attached to the encounter.    Results    Assessment:   1. Foot drop, right foot   2. Neurologic gait dysfunction      Plan:  Patient was evaluated and treated and all questions answered.  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 She experiences difficulty walking without shoes and discomfort with the current brace. The brace is essential for maintaining foot position and preventing tripping. Physical therapy is recommended to improve walking strategies and strengthen leg muscles. Physical therapy will not restore nerve function but can enhance muscle strength and walking ability. Emphasized the importance of brace and therapy compliance. No surgical or medication options are available for this condition. - Refer to physical therapy for walking strategies and leg strengthening - Adjust or reshape the current brace if necessary - Educate on the importance of brace compliance  Onychomycosis of right foot Thick and elongated mycotic nails on the right foot. Topical antifungal treatments are not recommended at this stage. Focus is on nail care, including trimming and cleaning. - Trim and clean mycotic nails      No follow-ups on file.

## 2024-06-10 ENCOUNTER — Encounter: Payer: Self-pay | Admitting: Physical Therapy

## 2024-06-10 ENCOUNTER — Ambulatory Visit: Attending: Podiatry | Admitting: Physical Therapy

## 2024-06-10 VITALS — BP 123/72 | HR 61

## 2024-06-10 DIAGNOSIS — R293 Abnormal posture: Secondary | ICD-10-CM | POA: Diagnosis present

## 2024-06-10 DIAGNOSIS — M6281 Muscle weakness (generalized): Secondary | ICD-10-CM | POA: Insufficient documentation

## 2024-06-10 DIAGNOSIS — R269 Unspecified abnormalities of gait and mobility: Secondary | ICD-10-CM | POA: Insufficient documentation

## 2024-06-10 DIAGNOSIS — R2689 Other abnormalities of gait and mobility: Secondary | ICD-10-CM | POA: Insufficient documentation

## 2024-06-10 DIAGNOSIS — M21371 Foot drop, right foot: Secondary | ICD-10-CM | POA: Insufficient documentation

## 2024-06-10 NOTE — Therapy (Signed)
 OUTPATIENT PHYSICAL THERAPY NEURO EVALUATION   Patient Name: Tamara Russell MRN: [id number] DOB:December 25, 1960, 63 y.o., female Today's Date: 06/10/2024   PCP: Benjamine Aland, MD REFERRING PROVIDER: Silva Juliene SAUNDERS, DPM  END OF SESSION:  PT End of Session - 06/10/24 0830     Visit Number 1    Number of Visits 7    Date for PT Re-Evaluation 08/05/24   Due to delay in scheduling   Authorization Type [employer] Medicare    PT Start Time 0827    PT Stop Time 0910    PT Time Calculation (min) 43 min    Activity Tolerance Patient tolerated treatment well    Behavior During Therapy [facility] for tasks assessed/performed          Past Medical History:  Diagnosis Date   Acid reflux    Chronic diastolic heart failure (HCC)    Chronic respiratory failure with hypoxia and hypercapnia (HCC)    on 2 L [place] continuous   Complication of anesthesia    slow to wake up   DVT (deep venous thrombosis) (HCC)    of LLE ; dx'ed on 03/05/22   History of COVID-19    HLD (hyperlipidemia)    Hypertension    Past Surgical History:  Procedure Laterality Date   ABDOMINAL HYSTERECTOMY     BIOPSY  11/17/2018   Procedure: BIOPSY;  Surgeon: Albertus Gordy HERO, MD;  Location: [facility];  Service: Gastroenterology;;   BREAST EXCISIONAL BIOPSY Left    benign   BREAST EXCISIONAL BIOPSY Right    benign   ESOPHAGOGASTRODUODENOSCOPY (EGD) WITH PROPOFOL  N/A 11/17/2018   Procedure: ESOPHAGOGASTRODUODENOSCOPY (EGD) WITH PROPOFOL ;  Surgeon: Albertus Gordy HERO, MD;  Location: [facility];  Service: Gastroenterology;  Laterality: N/A;   Patient Active Problem List   Diagnosis Date Noted   External hemorrhoids without complication 08/10/2022   Chronic venous insufficiency 07/04/2022   HLD (hyperlipidemia)    Acute encephalopathy 04/15/2022   DVT (deep venous thrombosis) (HCC) 03/05/2022   Ileus (HCC) 03/04/2022   OSA (obstructive sleep apnea) 03/04/2022   Encephalopathy    Acute metabolic encephalopathy 02/06/2022   Chronic  iron deficiency anemia 02/06/2022   Acute on chronic diastolic CHF (congestive heart failure) (HCC) 02/06/2022   Bradycardia    Sinus pause    Hypokalemia    Hypomagnesemia    Chronic heart failure with preserved ejection fraction (HFpEF) (HCC)    Elevated troponin level not due to acute coronary syndrome    Obesity, Class III, BMI 40-49.9 (morbid obesity)    GI bleed 11/19/2018   Hypertension    Acute gastric ulcer with hemorrhage    PUD (peptic ulcer disease)    GERD (gastroesophageal reflux disease) 11/19/2015    ONSET DATE: 05/15/2024 (referral)   REFERRING DIAG: M21.371 (ICD-10-CM) - Foot drop, right foot R26.9 (ICD-10-CM) - Neurologic gait dysfunction  THERAPY DIAG:  Abnormal posture  Muscle weakness (generalized)  Other abnormalities of gait and mobility  Foot drop, right  Rationale for Evaluation and Treatment: Rehabilitation  SUBJECTIVE:  SUBJECTIVE STATEMENT: Pt presents w/RW and daughter, [name]. Pt presents w/ extreme forward flexed posture, not wearing R AFO. States she ordered her AFO online two years ago and does not wear it because it hurts her foot. States she was told to just order it online. Did not bring it today.    Denies recent falls. Is pretty sedentary and has been for ~2 years. I don't do much walking, I need to. States she uses the RW in community but cannot fit it in her home, so relies on furniture walking. Has always had forward flexed posture with the RW, I have been told a thousand times to stand up straight.   Pt accompanied by: Daughter, [name]   PERTINENT HISTORY: CHF, hypertension, hyperlipidemia, prior DVT PAIN:  Are you having pain? No  PRECAUTIONS: Fall  RED FLAGS: None   WEIGHT BEARING RESTRICTIONS: No  FALLS: Has patient fallen in last 6  months? No  LIVING ENVIRONMENT: Lives with: lives with their family and lives with their spouse Lives in: House/apartment Stairs: Yes: External: 2 steps; on right going up, on left going up, and can reach both Has following equipment at home: Quad cane small base, Walker - 2 wheeled, Grab bars, and AFO   PLOF: Requires assistive device for independence, Needs assistance with ADLs, Needs assistance with homemaking, and Leisure: loves to ride in the car, sit out on the front porch, watch TV   PATIENT GOALS: My foot! I want to walk! I dream about walking   OBJECTIVE:  Note: Objective measures were completed at Evaluation unless otherwise noted.  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    POSTURE: rounded shoulders, forward head, and increased thoracic kyphosis  LOWER EXTREMITY ROM:     Active  Right Eval Left Eval  Hip flexion    Hip extension    Hip abduction    Hip adduction    Hip internal rotation    Hip external rotation    Knee flexion    Knee extension    Ankle dorsiflexion    Ankle plantarflexion    Ankle inversion    Ankle eversion     (Blank rows = not tested)  LOWER EXTREMITY MMT:  Tested in seated position   MMT Right Eval Left Eval  Hip flexion 4 3+  Hip extension    Hip abduction 4+ 4+  Hip adduction 4+ 4+  Hip internal rotation    Hip external rotation    Knee flexion 5 5  Knee extension 5 5  Ankle dorsiflexion 1 4  Ankle plantarflexion    Ankle inversion    Ankle eversion    (Blank rows = not tested)  BED MOBILITY:  Not tested Pt reports independence with this   TRANSFERS: Sit to stand: Modified independence  Assistive device utilized: Environmental consultant - 2 wheeled     Stand to sit: Modified independence  Assistive device utilized: Environmental consultant - 2 wheeled     Braced hands on RW to push up to stand. Note  everted ankle position of R ankle   RAMP:  Not tested  CURB:  Not tested  STAIRS: Not tested GAIT: Gait pattern: excessive ankle eversion of RLE, step through pattern, decreased stride length, decreased hip/knee flexion- Right, decreased hip/knee flexion- Left, decreased ankle dorsiflexion- Right, lateral hip instability, trunk flexed, wide BOS, and poor foot clearance- Right Distance  walked: various clinic distances  Assistive device utilized: Walker - 2 wheeled Level of assistance: SBA Comments: Pt's RW too tall and pt w/severe forward flexed posture w/bilateral elbow extension pushing RW away from body. Pt w/ER of RLE and frequently steps outside of RW to accommodate foot position to prevent kicking the leg of walker.    FUNCTIONAL TESTS:   [facility] PT Assessment - 06/10/24 0851       Transfers   Five time sit to stand comments  13.35s w/heavy BUE support   Initially reported 9/10 RPE, later changed to 5/10     Ambulation/Gait   Gait velocity 32.8' over 15.38s = 2.13 ft/s w/RW            VITALS  Vitals:   06/10/24 0847  BP: 123/72  Pulse: 61                                                                                                                                 TREATMENT :   Self-care/home management  Assessed vitals (see above) and WNL  Pt reports she likes to drive on her own and daughter reports pt has been told she is not to drive. Reiterated importance of pt NOT driving as pt has neuropathy and weakness in R foot. Pt verbalized understanding.  Discussed importance of pt working on her exercises and proper use of AD at home in order to see gains in PT. Pt reports she does not use the RW inside her home due to it being too wide to fit in doorways and hallways. Advised pt to work on standing upright and proper gait mechanics while at home and educated pt on importance of posture. Pt stood up and demonstrated ability to stand erect and provided pt w/cues to engage  hip extensors w/gait.  Adjusted height of RW and pt reported feeling funny but stated she would practice upright posture w/RW.  Educated pt and daughter on process to obtain AFO and will plan on trialing AFOs in clinic.   PATIENT EDUCATION: Education details: POC, eval findings, see self-care above  Person educated: Patient Education method: Explanation, Demonstration, and Verbal cues Education comprehension: verbalized understanding, returned demonstration, verbal cues required, and needs further education  HOME EXERCISE PROGRAM: To be established   GOALS: Goals reviewed with patient? Yes  SHORT TERM GOALS: Target date: 07/08/2024   Pt will be independent with initial HEP for improved strength, balance, transfers and gait.  Baseline: not established on eval  Goal status: INITIAL  2.  Pt will trial various foot drop braces on RLE to determine best option for improved step clearance w/RLE, stability with gait and independence w/mobility  Baseline: pt has AFO that she bought online, is to bring it to first treatment session  Goal status: INITIAL   3.  to be assessed and LTG updated  Baseline:  Goal status: INITIAL  4.  TUG to be assessed and LTG  updated Baseline:  Goal status: INITIAL   LONG TERM GOALS: Target date: 08/05/2024   Pt will improve gait velocity to at least 2.5 ft/s w/LRAD and improved upright posture for improved gait efficiency and independence   Baseline: 2.13 ft/s w/RW and severe forward flexed posture  Goal status: INITIAL  2.  Pt will be independent and compliant with final HEP and walking program for improved strength, balance, transfers and gait.  Baseline:  Goal status: INITIAL  3.  goal Baseline:  Goal status: INITIAL  4.  TUG goal  Baseline:  Goal status: INITIAL   ASSESSMENT:  CLINICAL IMPRESSION: Patient is a 63 year old female referred to Neuro OPPT for R foot drop. Pt's PMH is significant for: CHF, hypertension,  hyperlipidemia, prior DVT. The following deficits were present during the exam: postural deficits, global deconditioning, cognitive impairment, impaired sensation of RLE, R foot drop, impaired muscle strength and improper body mechanics. Based on R foot drop, gait speed and deconditioning, pt is an incr risk for falls. Pt would benefit from skilled PT to address these impairments and functional limitations to maximize functional mobility and independence.   OBJECTIVE IMPAIRMENTS: Abnormal gait, decreased activity tolerance, decreased balance, decreased cognition, decreased endurance, decreased knowledge of condition, decreased knowledge of use of DME, decreased mobility, difficulty walking, decreased strength, decreased safety awareness, impaired sensation, improper body mechanics, and postural dysfunction  ACTIVITY LIMITATIONS: carrying, lifting, bending, standing, squatting, stairs, transfers, bathing, locomotion level, and caring for others  PARTICIPATION LIMITATIONS: meal prep, cleaning, laundry, interpersonal relationship, driving, shopping, community activity, and yard work  PERSONAL FACTORS: Behavior pattern, Fitness, Past/current experiences, Transportation, and 1-2 comorbidities: Peripheral neuropathy in RLE and R foot drop are also affecting patient's functional outcome.   REHAB POTENTIAL: Fair Due to severity of deficits   CLINICAL DECISION MAKING: Evolving/moderate complexity  EVALUATION COMPLEXITY: Moderate  PLAN:  PT FREQUENCY: 1x/week (pt only able to come 1x/week due to copay and transportation)   PT DURATION: 6 weeks (POC written for 8 weeks due to delay in scheduling)   PLANNED INTERVENTIONS: 02835- PT Re-evaluation, 97750- Physical Performance Testing, 97110-Therapeutic exercises, 97530- Therapeutic activity, [id number]- Neuromuscular re-education, 97535- Self Care, 02859- Manual therapy, [id number]- Gait training, [phone]- Orthotic Initial, [id number]- Orthotic/Prosthetic subsequent, [id number]-  Aquatic Therapy, [id number]- Electrical stimulation (manual), [id number] (1-2 muscles), [id number] (3+ muscles)- Dry Needling, Patient/Family education, Balance training, Stair training, Joint mobilization, Spinal mobilization, Vestibular training, and DME instructions  PLAN FOR NEXT SESSION: Assess and TUG and update goals. Did she bring brace? Trial other AFOs. HEP for improved endurance, upright posture, R ankle stability    Noor Witte E Devontaye Ground, PT, DPT 06/10/2024, 9:35 AM

## 2024-06-12 ENCOUNTER — Encounter: Payer: Self-pay | Admitting: Family Medicine

## 2024-06-12 ENCOUNTER — Ambulatory Visit: Admitting: Family Medicine

## 2024-06-12 VITALS — BP 124/86 | HR 71 | Temp 98.2°F | Ht 62.0 in | Wt 230.0 lb

## 2024-06-12 NOTE — Progress Notes (Signed)
 New Patient Office Visit  Subjective   Patient ID: Tamara Russell, female    DOB: 10/22/1960  Age: 63 y.o. MRN: [id number]  CC:  Chief Complaint  Patient presents with   Establish Care    HPI [facility] presents to establish care with new provider. However, patient was uncertain that Dr. Benjamine is her primary care provider. Patient is accompanied by spouse. When asking about establishing care and not seeing Dr. Benjamine any longer, she reported she didn't want to leave Dr. Benjamine. During visit, she called their office to confirm that she is her primary care provider. Explained to patient that she could not have two primary care providers with insurance covering the cost. She reports she was sorry for taking up time and wanted to continue her care with her present provider, Dr. Benjamine.   Patients previous primary care provider: Dr. Kennieth Benjamine [facility] in [place]. Last seen on 09/22/2023.   Outpatient Encounter Medications as of 06/12/2024  Medication Sig   amLODipine  (NORVASC ) 10 MG tablet Take 10 mg by mouth daily.   metoprolol  succinate (TOPROL -XL) 25 MG 24 hr tablet Take 1 tablet (25 mg total) by mouth daily.   Multiple Vitamins-Minerals (CENTRUM SILVER  ADULT 50+ PO) Take 1 tablet by mouth daily.   olmesartan -hydrochlorothiazide  (BENICAR  HCT) 40-25 MG tablet Take 1 tablet by mouth daily.   ferrous sulfate  324 MG TBEC Take 324 mg by mouth every morning. (Patient not taking: Reported on 06/12/2024)   [DISCONTINUED] QUEtiapine  (SEROQUEL ) 25 MG tablet TAKE 1 TABLET BY MOUTH EVERYDAY AT BEDTIME (Patient not taking: Reported on 06/10/2024)   No facility-administered encounter medications on file as of 06/12/2024.    Past Medical History:  Diagnosis Date   Acid reflux    Chronic diastolic heart failure (HCC)    Chronic respiratory failure with hypoxia and hypercapnia (HCC)    on 2 L [place] continuous   Complication of anesthesia    slow to wake up   DVT (deep venous  thrombosis) (HCC)    of LLE ; dx'ed on 03/05/22   History of COVID-19    HLD (hyperlipidemia)    Hypertension     Past Surgical History:  Procedure Laterality Date   ABDOMINAL HYSTERECTOMY     BIOPSY  11/17/2018   Procedure: BIOPSY;  Surgeon: Albertus Gordy HERO, MD;  Location: [facility];  Service: Gastroenterology;;   BREAST EXCISIONAL BIOPSY Left    benign   BREAST EXCISIONAL BIOPSY Right    benign   ESOPHAGOGASTRODUODENOSCOPY (EGD) WITH PROPOFOL  N/A 11/17/2018   Procedure: ESOPHAGOGASTRODUODENOSCOPY (EGD) WITH PROPOFOL ;  Surgeon: Albertus Gordy HERO, MD;  Location: [facility] END[facility];  Service: Gastroenterology;  Laterality: N/A;    Family History  Problem Relation Age of Onset   Cancer Father 20       stomach   Hypertension Father    Diabetes Maternal Grandmother    Colon cancer Maternal Grandfather    Diabetes Paternal Grandmother    Colon cancer Paternal Grandmother     Social History   Socioeconomic History   Marital status: Married    Spouse name: Not on file   Number of children: 3   Years of education: Not on file   Highest education level: Not on file  Occupational History   Occupation: unemployed  Tobacco Use   Smoking status: Former    Current packs/day: 0.00    Average packs/day: 0.5 packs/day for 20.0 years (10.0 ttl pk-yrs)    Types: Cigarettes  Start date: 04/30/1975    Quit date: 04/30/1995    Years since quitting: 29.1   Smokeless tobacco: Never  Vaping Use   Vaping status: Never Used  Substance and Sexual Activity   Alcohol use: No    Alcohol/week: 0.0 standard drinks of alcohol   Drug use: No   Sexual activity: Yes    Birth control/protection: None  Other Topics Concern   Not on file  Social History Narrative   Not on file   Social Drivers of Health   Financial Resource Strain: Low Risk  (06/12/2024)   Overall Financial Resource Strain (CARDIA)    Difficulty of Paying Living Expenses: Not hard at all  Food Insecurity: No Food Insecurity (06/12/2024)    Hunger Vital Sign    Worried About Running Out of Food in the Last Year: Never true    Ran Out of Food in the Last Year: Never true  Transportation Needs: No Transportation Needs (06/12/2024)   PRAPARE - Administrator, Civil Service (Medical): No    Lack of Transportation (Non-Medical): No  Physical Activity: Inactive (06/12/2024)   Exercise Vital Sign    Days of Exercise per Week: 0 days    Minutes of Exercise per Session: 0 min  Stress: No Stress Concern Present (06/12/2024)   [employer] of Occupational Health - Occupational Stress Questionnaire    Feeling of Stress: Not at all  Social Connections: Moderately Integrated (06/12/2024)   Social Connection and Isolation Panel    Frequency of Communication with Friends and Family: More than three times a week    Frequency of Social Gatherings with Friends and Family: Once a week    Attends Religious Services: More than 4 times per year    Active Member of [employer] or Organizations: No    Attends Banker Meetings: Never    Marital Status: Married  Catering manager Violence: Not At Risk (06/12/2024)   Humiliation, Afraid, Rape, and Kick questionnaire    Fear of Current or Ex-Partner: No    Emotionally Abused: No    Physically Abused: No    Sexually Abused: No    ROS See HPI above    Objective    BP 124/86   Pulse 71   Temp 98.2 F (36.8 C) (Oral)   Ht 5' 2 (1.575 m)   Wt 230 lb (104.3 kg)   SpO2 98%   BMI 42.07 kg/m   Physical Exam    Assessment & Plan:  There are no diagnoses linked to this encounter.  No follow-ups on file.   Marlowe Cinquemani, NP

## 2024-06-30 ENCOUNTER — Ambulatory Visit: Attending: Podiatry | Admitting: Physical Therapy

## 2024-06-30 VITALS — BP 139/87 | HR 63

## 2024-06-30 DIAGNOSIS — M6281 Muscle weakness (generalized): Secondary | ICD-10-CM | POA: Insufficient documentation

## 2024-06-30 DIAGNOSIS — R2689 Other abnormalities of gait and mobility: Secondary | ICD-10-CM | POA: Diagnosis present

## 2024-06-30 DIAGNOSIS — R293 Abnormal posture: Secondary | ICD-10-CM | POA: Insufficient documentation

## 2024-06-30 DIAGNOSIS — M21371 Foot drop, right foot: Secondary | ICD-10-CM | POA: Diagnosis present

## 2024-06-30 NOTE — Therapy (Addendum)
 OUTPATIENT PHYSICAL THERAPY NEURO TREATMENT   Patient Name: Tamara Russell MRN: [id number] DOB:November 07, 1960, 63 y.o., female Today's Date: 06/30/2024   PCP: Tamara Aland, MD REFERRING PROVIDER: Silva Russell Russell, DPM  END OF SESSION:  PT End of Session - 06/30/24 0850     Visit Number 2    Number of Visits 7    Date for PT Re-Evaluation 08/05/24   Due to delay in scheduling   Authorization Type [facility] Medicare    Authorization Time Period 6 visits 8/26-10/21/25    PT Start Time 0847    PT Stop Time 0928    PT Time Calculation (min) 41 min    Equipment Utilized During Treatment Other (comment)   R AFO   Activity Tolerance Patient tolerated treatment well;Patient limited by fatigue    Behavior During Therapy [facility] for tasks assessed/performed           Past Medical History:  Diagnosis Date   Acid reflux    Chronic diastolic heart failure (HCC)    Chronic respiratory failure with hypoxia and hypercapnia (HCC)    on 2 L [place] continuous   Complication of anesthesia    slow to wake up   DVT (deep venous thrombosis) (HCC)    of LLE ; dx'ed on 03/05/22   History of COVID-19    HLD (hyperlipidemia)    Hypertension    Past Surgical History:  Procedure Laterality Date   ABDOMINAL HYSTERECTOMY     BIOPSY  11/17/2018   Procedure: BIOPSY;  Surgeon: Tamara Gordy HERO, MD;  Location: [facility];  Service: Gastroenterology;;   BREAST EXCISIONAL BIOPSY Left    benign   BREAST EXCISIONAL BIOPSY Right    benign   ESOPHAGOGASTRODUODENOSCOPY (EGD) WITH PROPOFOL  N/A 11/17/2018   Procedure: ESOPHAGOGASTRODUODENOSCOPY (EGD) WITH PROPOFOL ;  Surgeon: Tamara Gordy HERO, MD;  Location: [facility];  Service: Gastroenterology;  Laterality: N/A;   Patient Active Problem List   Diagnosis Date Noted   External hemorrhoids without complication 08/10/2022   Chronic venous insufficiency 07/04/2022   HLD (hyperlipidemia)    Acute encephalopathy 04/15/2022   DVT (deep venous thrombosis) (HCC) 03/05/2022    Ileus (HCC) 03/04/2022   OSA (obstructive sleep apnea) 03/04/2022   Encephalopathy    Acute metabolic encephalopathy 02/06/2022   Chronic iron deficiency anemia 02/06/2022   Acute on chronic diastolic CHF (congestive heart failure) (HCC) 02/06/2022   Bradycardia    Sinus pause    Hypokalemia    Hypomagnesemia    Chronic heart failure with preserved ejection fraction (HFpEF) (HCC)    Elevated troponin level not due to acute coronary syndrome    Obesity, Class III, BMI 40-49.9 (morbid obesity)    GI bleed 11/19/2018   Hypertension    Acute gastric ulcer with hemorrhage    PUD (peptic ulcer disease)    GERD (gastroesophageal reflux disease) 11/19/2015    ONSET DATE: 05/15/2024 (referral)   REFERRING DIAG: M21.371 (ICD-10-CM) - Foot drop, right foot R26.9 (ICD-10-CM) - Neurologic gait dysfunction  THERAPY DIAG:  Abnormal posture  Muscle weakness (generalized)  Other abnormalities of gait and mobility  Foot drop, right  Rationale for Evaluation and Treatment: Rehabilitation  SUBJECTIVE:  SUBJECTIVE STATEMENT: Pt presents w/RW and Tamara Russell. Tamara Russell holding AFO, states she did receive it from MD per her step-dad. Pt denies pain or acute changes.   Pt accompanied by: Tamara Russell   PERTINENT HISTORY: CHF, hypertension, hyperlipidemia, prior DVT PAIN:  Are you having pain? No  PRECAUTIONS: Fall  RED FLAGS: None   WEIGHT BEARING RESTRICTIONS: No  FALLS: Has patient fallen in last 6 months? No  LIVING ENVIRONMENT: Lives with: lives with their family and lives with their spouse Lives in: House/apartment Stairs: Yes: External: 2 steps; on right going up, on left going up, and can reach both Has following equipment at home: Quad cane small base, Walker - 2 wheeled, Grab bars, and  AFO   PLOF: Requires assistive device for independence, Needs assistance with ADLs, Needs assistance with homemaking, and Leisure: loves to ride in the car, sit out on the front porch, watch TV   PATIENT GOALS: My foot! I want to walk! I dream about walking   OBJECTIVE:  Note: Objective measures were completed at Evaluation unless otherwise noted.  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    POSTURE: rounded shoulders, forward head, and increased thoracic kyphosis  LOWER EXTREMITY ROM:     Active  Right Eval Left Eval  Hip flexion    Hip extension    Hip abduction    Hip adduction    Hip internal rotation    Hip external rotation    Knee flexion    Knee extension    Ankle dorsiflexion    Ankle plantarflexion    Ankle inversion    Ankle eversion     (Blank rows = not tested)  LOWER EXTREMITY MMT:  Tested in seated position   MMT Right Eval Left Eval  Hip flexion 4 3+  Hip extension    Hip abduction 4+ 4+  Hip adduction 4+ 4+  Hip internal rotation    Hip external rotation    Knee flexion 5 5  Knee extension 5 5  Ankle dorsiflexion 1 4  Ankle plantarflexion    Ankle inversion    Ankle eversion    (Blank rows = not tested)  BED MOBILITY:  Not tested Pt reports independence with this   TRANSFERS: Sit to stand: Modified independence  Assistive device utilized: Environmental consultant - 2 wheeled     Stand to sit: Modified independence  Assistive device utilized: Environmental consultant - 2 wheeled     Braced hands on RW to push up to stand. Note everted ankle position of R ankle   RAMP:  Not tested  CURB:  Not tested  STAIRS: Not tested GAIT: Gait pattern: excessive ankle eversion of RLE, step through pattern, decreased stride length, decreased hip/knee flexion- Right, decreased hip/knee flexion- Left, decreased ankle dorsiflexion-  Right, lateral hip instability, trunk flexed, wide BOS, and poor foot clearance- Right Distance walked: various clinic distances  Assistive device utilized: Walker - 2 wheeled Level of assistance: SBA Comments: Pt's RW too tall and pt w/severe forward flexed posture w/bilateral elbow extension pushing RW away from body. Pt w/ER of RLE and frequently steps outside of RW to accommodate foot position to prevent kicking the leg of walker.    FUNCTIONAL TESTS:       VITALS  Vitals:   06/30/24 0902  BP: 139/87  Pulse: 63  TREATMENT :   Self-care/home management  Assessed vitals in RLE (see above) and WNL   Ther Act  Gait pattern: ER of RLE w/reduced ankle eversion, step through pattern, decreased stride length, decreased hip/knee flexion- Right, decreased ankle dorsiflexion- Right, lateral hip instability, trunk flexed, and wide BOS Distance walked: 115' Assistive device utilized: Environmental consultant - 2 wheeled Level of assistance: CGA Comments: Assisted pt in donning R AFO, as pt reports it hurts her big toe. Pt reports she wears AFO all the time, but was unable to properly don AFO due to not remembering how it went in shoe. Provided max A to don AFO and pt denied pain w/brace on if shoe laces loosened. Pt continues to demonstrate severe forward flexed posture and required max cues to maintain close distance to RW as she pushes AD too far anteriorly. Pt requesting to have RW adjusted to make it taller, but informed pt this will cause excess strain on shoulders and to focus on upright posture instead. Noted reduced ankle eversion as well and increased step length/clearance of RLE w/use of AFO. Recommended pt start a wear schedule w/AFO, aiming to wear it for 2 hours today and increase by 1 hour every 3-4 days. Educated pt on importance of performing skin checks as well. Pt and  daughter verbalized understanding.   NuStep level 3 for 8 minutes using BUE/BLEs for neural priming for reciprocal movement, dynamic cardiovascular warmup and increased amplitude of stepping. RPE of 8/10 following activity.  Established initial HEP (see bolded below) for improved functional endurance and posterior chain strength to promote upright posture:  Sit to stands w/BUE abduction at top of rep, x10 reps. Pt required mod cues from therapist and daughter to perform all 10 reps as pt reporting she cannot do 10 due to fatigue. Pt was able to perform 10 reps without instability or high levels of fatigue.  At countertop, standing hip extension w/BUE support, x10 reps per side. Max multimodal cues to maintain upright posture throughout as pt wanting to lean on countertop to perform.     PATIENT EDUCATION: Education details: Proper wear schedule of AFO, initial HEP Person educated: Patient and Child(ren) Education method: Explanation, Demonstration, Tactile cues, Verbal cues, and Handouts Education comprehension: verbalized understanding, returned demonstration, verbal cues required, tactile cues required, and needs further education  HOME EXERCISE PROGRAM: Access Code: [email or web address] URL: [email or web address] Date: 06/30/2024 Prepared by: Marlon Bannie Lobban  Exercises - Sit to stand with arms wide   - 1 x daily - 7 x weekly - 3 sets - 10 reps - Standing Hip Extension with Counter Support  - 1 x daily - 7 x weekly - 2-3 sets - 10 reps  GOALS: Goals reviewed with patient? Yes  SHORT TERM GOALS: Target date: 07/08/2024   Pt will be independent with initial HEP for improved strength, balance, transfers and gait.  Baseline: not established on eval  Goal status: INITIAL  2.  Pt will trial various foot drop braces on RLE to determine best option for improved step clearance w/RLE, stability with gait and independence w/mobility  Baseline: pt has AFO that she bought online, is to  bring it to first treatment session  Goal status: INITIAL   3.  to be assessed and LTG updated  Baseline:  Goal status: INITIAL  4.  TUG to be assessed and LTG updated Baseline:  Goal status: INITIAL   LONG TERM GOALS: Target date: 08/05/2024   Pt will improve gait velocity to at least 2.5 ft/s  w/LRAD and improved upright posture for improved gait efficiency and independence   Baseline: 2.13 ft/s w/RW and severe forward flexed posture  Goal status: INITIAL  2.  Pt will be independent and compliant with final HEP and walking program for improved strength, balance, transfers and gait.  Baseline:  Goal status: INITIAL  3.  goal Baseline:  Goal status: INITIAL  4.  TUG goal  Baseline:  Goal status: INITIAL   ASSESSMENT:  CLINICAL IMPRESSION: Emphasis of skilled PT session on assessing gait w/AFO, pt and family education and establishing initial HEP. Pt's AFO very lightly worn and pt required max A to don brace despite telling therapist she wears it often. Pt then reporting she has not worn brace for a while. Educated pt and daughter on importance of progressive wear schedule and skin checks and proper way to don/doff brace. Established initial HEP and pt required max multimodal cues to perform due to improper posture and tendency to stop after 2-3 repetitions. Pt's daughter able to cue pt well in order to improve compliance and encourage pt to perform HEP properly. Continue POC.   OBJECTIVE IMPAIRMENTS: Abnormal gait, decreased activity tolerance, decreased balance, decreased cognition, decreased endurance, decreased knowledge of condition, decreased knowledge of use of DME, decreased mobility, difficulty walking, decreased strength, decreased safety awareness, impaired sensation, improper body mechanics, and postural dysfunction  ACTIVITY LIMITATIONS: carrying, lifting, bending, standing, squatting, stairs, transfers, bathing, locomotion level, and caring for  others  PARTICIPATION LIMITATIONS: meal prep, cleaning, laundry, interpersonal relationship, driving, shopping, community activity, and yard work  PERSONAL FACTORS: Behavior pattern, Fitness, Past/current experiences, Transportation, and 1-2 comorbidities: Peripheral neuropathy in RLE and R foot drop are also affecting patient's functional outcome.   REHAB POTENTIAL: Fair Due to severity of deficits   CLINICAL DECISION MAKING: Evolving/moderate complexity  EVALUATION COMPLEXITY: Moderate  PLAN:  PT FREQUENCY: 1x/week (pt only able to come 1x/week due to copay and transportation)   PT DURATION: 6 weeks (POC written for 8 weeks due to delay in scheduling)   PLANNED INTERVENTIONS: 02835- PT Re-evaluation, 97750- Physical Performance Testing, 97110-Therapeutic exercises, 97530- Therapeutic activity, [id number]- Neuromuscular re-education, 97535- Self Care, 02859- Manual therapy, [id number]- Gait training, [phone]- Orthotic Initial, [id number]- Orthotic/Prosthetic subsequent, [id number]- Aquatic Therapy, [phone]- Electrical stimulation (manual), [phone] (1-2 muscles), [phone] (3+ muscles)- Dry Needling, Patient/Family education, Balance training, Stair training, Joint mobilization, Spinal mobilization, Vestibular training, and DME instructions  PLAN FOR NEXT SESSION: Assess and TUG and update goals. How is wearing brace? Add to HEP for improved endurance, upright posture, R ankle stability, toe taps to step    Marlon BRAVO Opel Lejeune, PT, DPT 06/30/2024, 9:36 AM

## 2024-07-07 ENCOUNTER — Ambulatory Visit: Admitting: Physical Therapy

## 2024-07-07 VITALS — BP 141/70 | HR 61

## 2024-07-07 DIAGNOSIS — M6281 Muscle weakness (generalized): Secondary | ICD-10-CM

## 2024-07-07 DIAGNOSIS — R293 Abnormal posture: Secondary | ICD-10-CM | POA: Diagnosis not present

## 2024-07-07 DIAGNOSIS — M21371 Foot drop, right foot: Secondary | ICD-10-CM

## 2024-07-07 DIAGNOSIS — R2689 Other abnormalities of gait and mobility: Secondary | ICD-10-CM

## 2024-07-07 NOTE — Therapy (Signed)
 OUTPATIENT PHYSICAL THERAPY NEURO TREATMENT   Patient Name: Tamara Russell MRN: [id number] DOB:16-Oct-1961, 63 y.o., female Today's Date: 07/07/2024   PCP: Benjamine Aland, MD REFERRING PROVIDER: Silva Juliene SAUNDERS, DPM  END OF SESSION:  PT End of Session - 07/07/24 0845     Visit Number 3    Number of Visits 7    Date for Recertification  08/05/24   Due to delay in scheduling   Authorization Type [facility] Medicare    Authorization Time Period 6 visits 8/26-10/21/25    PT Start Time 0844    PT Stop Time 0924    PT Time Calculation (min) 40 min    Equipment Utilized During Treatment Other (comment);Gait belt   R AFO   Activity Tolerance Patient tolerated treatment well;Patient limited by fatigue    Behavior During Therapy WFL for tasks assessed/performed           Past Medical History:  Diagnosis Date   Acid reflux    Chronic diastolic heart failure (HCC)    Chronic respiratory failure with hypoxia and hypercapnia (HCC)    on 2 L [place] continuous   Complication of anesthesia    slow to wake up   DVT (deep venous thrombosis) (HCC)    of LLE ; dx'ed on 03/05/22   History of COVID-19    HLD (hyperlipidemia)    Hypertension    Past Surgical History:  Procedure Laterality Date   ABDOMINAL HYSTERECTOMY     BIOPSY  11/17/2018   Procedure: BIOPSY;  Surgeon: Albertus Gordy HERO, MD;  Location: [facility];  Service: Gastroenterology;;   BREAST EXCISIONAL BIOPSY Left    benign   BREAST EXCISIONAL BIOPSY Right    benign   ESOPHAGOGASTRODUODENOSCOPY (EGD) WITH PROPOFOL  N/A 11/17/2018   Procedure: ESOPHAGOGASTRODUODENOSCOPY (EGD) WITH PROPOFOL ;  Surgeon: Albertus Gordy HERO, MD;  Location: [facility];  Service: Gastroenterology;  Laterality: N/A;   Patient Active Problem List   Diagnosis Date Noted   External hemorrhoids without complication 08/10/2022   Chronic venous insufficiency 07/04/2022   HLD (hyperlipidemia)    Acute encephalopathy 04/15/2022   DVT (deep venous thrombosis) (HCC)  03/05/2022   Ileus (HCC) 03/04/2022   OSA (obstructive sleep apnea) 03/04/2022   Encephalopathy    Acute metabolic encephalopathy 02/06/2022   Chronic iron deficiency anemia 02/06/2022   Acute on chronic diastolic CHF (congestive heart failure) (HCC) 02/06/2022   Bradycardia    Sinus pause    Hypokalemia    Hypomagnesemia    Chronic heart failure with preserved ejection fraction (HFpEF) (HCC)    Elevated troponin level not due to acute coronary syndrome    Obesity, Class III, BMI 40-49.9 (morbid obesity)    GI bleed 11/19/2018   Hypertension    Acute gastric ulcer with hemorrhage    PUD (peptic ulcer disease)    GERD (gastroesophageal reflux disease) 11/19/2015    ONSET DATE: 05/15/2024 (referral)   REFERRING DIAG: M21.371 (ICD-10-CM) - Foot drop, right foot R26.9 (ICD-10-CM) - Neurologic gait dysfunction  THERAPY DIAG:  Abnormal posture  Muscle weakness (generalized)  Other abnormalities of gait and mobility  Foot drop, right  Rationale for Evaluation and Treatment: Rehabilitation  SUBJECTIVE:  SUBJECTIVE STATEMENT: Pt presents w/RW and daughter, [name]. Wearing R AFO. States she is wearing it about 2 hours per day. Hurts her foot some but is tolerable. No falls.   Pt accompanied by: Daughter, [name]   PERTINENT HISTORY: CHF, hypertension, hyperlipidemia, prior DVT PAIN:  Are you having pain? Yes: NPRS scale: 8/10 Pain location: Low back  Pain description: achy  PRECAUTIONS: Fall  RED FLAGS: None   WEIGHT BEARING RESTRICTIONS: No  FALLS: Has patient fallen in last 6 months? No  LIVING ENVIRONMENT: Lives with: lives with their family and lives with their spouse Lives in: House/apartment Stairs: Yes: External: 2 steps; on right going up, on left going up, and can reach  both Has following equipment at home: Quad cane small base, Walker - 2 wheeled, Grab bars, and AFO   PLOF: Requires assistive device for independence, Needs assistance with ADLs, Needs assistance with homemaking, and Leisure: loves to ride in the car, sit out on the front porch, watch TV   PATIENT GOALS: My foot! I want to walk! I dream about walking   OBJECTIVE:  Note: Objective measures were completed at Evaluation unless otherwise noted.  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    POSTURE: rounded shoulders, forward head, and increased thoracic kyphosis  LOWER EXTREMITY ROM:     Active  Right Eval Left Eval  Hip flexion    Hip extension    Hip abduction    Hip adduction    Hip internal rotation    Hip external rotation    Knee flexion    Knee extension    Ankle dorsiflexion    Ankle plantarflexion    Ankle inversion    Ankle eversion     (Blank rows = not tested)  LOWER EXTREMITY MMT:  Tested in seated position   MMT Right Eval Left Eval  Hip flexion 4 3+  Hip extension    Hip abduction 4+ 4+  Hip adduction 4+ 4+  Hip internal rotation    Hip external rotation    Knee flexion 5 5  Knee extension 5 5  Ankle dorsiflexion 1 4  Ankle plantarflexion    Ankle inversion    Ankle eversion    (Blank rows = not tested)  BED MOBILITY:  Not tested Pt reports independence with this   TRANSFERS: Sit to stand: Modified independence  Assistive device utilized: Environmental consultant - 2 wheeled     Stand to sit: Modified independence  Assistive device utilized: Environmental consultant - 2 wheeled     Braced hands on RW to push up to stand. Note everted ankle position of R ankle   RAMP:  Not tested  CURB:  Not tested  STAIRS: Not tested GAIT: Gait pattern: excessive ankle eversion of RLE, step through pattern, decreased stride length,  decreased hip/knee flexion- Right, decreased hip/knee flexion- Left, decreased ankle dorsiflexion- Right, lateral hip instability, trunk flexed, wide BOS, and poor foot clearance- Right Distance walked: various clinic distances  Assistive device utilized: Walker - 2 wheeled Level of assistance: SBA Comments: Pt's RW too tall and pt w/severe forward flexed posture w/bilateral elbow extension pushing RW away from body. Pt w/ER of RLE and frequently steps outside of RW to accommodate foot position to prevent kicking the leg of walker.    FUNCTIONAL TESTS:   [facility] PT Assessment - 07/07/24 9096  Balance   Balance Assessed Yes      Standardized Balance Assessment   Standardized Balance Assessment Timed Up and Go Test      Timed Up and Go Test   Normal TUG (seconds) 20.81   w/RW and R AFO            VITALS  Vitals:   07/07/24 0848  BP: (!) 141/70  Pulse: 61                                                                                                                                   TREATMENT :   Self-care/home management  Assessed vitals in LUE (see above) and WNL   Ther Act  NuStep level 4 for 8 minutes using BUE/BLEs for neural priming for reciprocal movement, dynamic cardiovascular warmup and increased amplitude of stepping. RPE of 10/10 following activity.  Encouraged pt to look into membership at [facility] [employer] to improve aerobic capacity and start exercise regime. Daughter reports she will go with pt and will call insurance today to see if she has any Silver  Sneakers benefits. Pt reports she would enjoy riding the bike and water  aerobics and daughter states she can transport pt.   Physical Performance   [facility] PT Assessment - 07/07/24 0903       Balance   Balance Assessed Yes      Standardized Balance Assessment   Standardized Balance Assessment Timed Up and Go Test      Timed Up and Go Test   Normal TUG (seconds) 20.81   w/RW and R AFO          Gait  pattern: step through pattern, decreased stride length, decreased hip/knee flexion- Right, decreased hip/knee flexion- Left, decreased ankle dorsiflexion- Right, lateral hip instability, trunk flexed, and wide BOS Distance walked: 115' loop completed 3x + 61' = 406'  Assistive device utilized: Environmental consultant - 2 wheeled Level of assistance: SBA Comments: Mod verbal cues for upright posture throughout and pt required several standing rest breaks due to fatigue. RPE of 10/10 following activity.     PATIENT EDUCATION: Education details: Encouragement to go to [employer], and TUG results  Person educated: Patient and Child(ren) Education method: Explanation, Demonstration, Tactile cues, and Verbal cues Education comprehension: verbalized understanding, returned demonstration, verbal cues required, tactile cues required, and needs further education  HOME EXERCISE PROGRAM: Access Code: [email or web address] URL: [email or web address] Date: 06/30/2024 Prepared by: Marlon Shanda Cadotte  Exercises - Sit to stand with arms wide   - 1 x daily - 7 x weekly - 3 sets - 10 reps - Standing Hip Extension with Counter Support  - 1 x daily - 7 x weekly - 2-3 sets - 10 reps  GOALS: Goals reviewed with patient? Yes  SHORT TERM GOALS: Target date: 07/08/2024   Pt will be independent with initial HEP for improved strength, balance, transfers and gait.  Baseline: not established on eval  Goal  status: MET  2.  Pt will trial various foot drop braces on RLE to determine best option for improved step clearance w/RLE, stability with gait and independence w/mobility  Baseline: pt has AFO that she bought online, is to bring it to first treatment session; pt has AFO Goal status: MET   3.  to be assessed and LTG updated  Baseline:  Goal status: MET  4.  TUG to be assessed and LTG updated Baseline:  Goal status: MET   LONG TERM GOALS: Target date: 08/05/2024   Pt will improve gait velocity to at least 2.5  ft/s w/LRAD and improved upright posture for improved gait efficiency and independence   Baseline: 2.13 ft/s w/RW and severe forward flexed posture  Goal status: INITIAL  2.  Pt will be independent and compliant with final HEP and walking program for improved strength, balance, transfers and gait.  Baseline:  Goal status: INITIAL  3.  Pt will ambulate greater than or equal to 500 feet on with RW and R AFO for improved cardiovascular endurance and BLE strength.   Baseline: 66' w/RW and R AFO (9/22) Goal status: REVISED   4.  Pt will improve normal TUG to less than or equal to 15 seconds for improved functional mobility and decreased fall risk.  Baseline: 20.81s w/RW and R AFO (9/22) Goal status: REVISED    ASSESSMENT:  CLINICAL IMPRESSION: Emphasis of skilled PT session on STG assessment and cardiovascular endurance. Pt has met 4 of 4 STGs, wearing her AFO for about 2 hours/day at home and reporting independence w/HEP. Pt completed her assessment of TUG and w/RW and AFO, placing her at increased fall risk and demonstrating significant impairment in aerobic capacity. Pt able to complete entirety of w/encouraging cues from therapist and daughter, frequently stating she was tired and frequently bending over her RW. Recommended pt join the [facility] to continue improving aerobic capacity and functional strength for improved endurance and independence at home. Continue POC.   OBJECTIVE IMPAIRMENTS: Abnormal gait, decreased activity tolerance, decreased balance, decreased cognition, decreased endurance, decreased knowledge of condition, decreased knowledge of use of DME, decreased mobility, difficulty walking, decreased strength, decreased safety awareness, impaired sensation, improper body mechanics, and postural dysfunction  ACTIVITY LIMITATIONS: carrying, lifting, bending, standing, squatting, stairs, transfers, bathing, locomotion level, and caring for others  PARTICIPATION  LIMITATIONS: meal prep, cleaning, laundry, interpersonal relationship, driving, shopping, community activity, and yard work  PERSONAL FACTORS: Behavior pattern, Fitness, Past/current experiences, Transportation, and 1-2 comorbidities: Peripheral neuropathy in RLE and R foot drop are also affecting patient's functional outcome.   REHAB POTENTIAL: Fair Due to severity of deficits   CLINICAL DECISION MAKING: Evolving/moderate complexity  EVALUATION COMPLEXITY: Moderate  PLAN:  PT FREQUENCY: 1x/week (pt only able to come 1x/week due to copay and transportation)   PT DURATION: 6 weeks (POC written for 8 weeks due to delay in scheduling)   PLANNED INTERVENTIONS: 02835- PT Re-evaluation, 97750- Physical Performance Testing, 97110-Therapeutic exercises, 97530- Therapeutic activity, [id number]- Neuromuscular re-education, 97535- Self Care, 02859- Manual therapy, [id number]- Gait training, [phone]- Orthotic Initial, [id number]- Orthotic/Prosthetic subsequent, [id number]- Aquatic Therapy, [phone]- Electrical stimulation (manual), [phone] (1-2 muscles), [id number] (3+ muscles)- Dry Needling, Patient/Family education, Balance training, Stair training, Joint mobilization, Spinal mobilization, Vestibular training, and DME instructions  PLAN FOR NEXT SESSION: Did they look into [employer]? Supine lumbar exercises. How is wearing brace? Add to HEP for improved endurance, upright posture, R ankle stability, toe taps to step    Khrista Braun E Bhavik Cabiness,  PT, DPT 07/07/2024, 9:24 AM

## 2024-07-14 ENCOUNTER — Ambulatory Visit: Admitting: Physical Therapy

## 2024-07-14 DIAGNOSIS — R293 Abnormal posture: Secondary | ICD-10-CM

## 2024-07-14 DIAGNOSIS — M6281 Muscle weakness (generalized): Secondary | ICD-10-CM

## 2024-07-14 DIAGNOSIS — M21371 Foot drop, right foot: Secondary | ICD-10-CM

## 2024-07-14 DIAGNOSIS — R2689 Other abnormalities of gait and mobility: Secondary | ICD-10-CM

## 2024-07-14 NOTE — Therapy (Signed)
 OUTPATIENT PHYSICAL THERAPY NEURO TREATMENT   Patient Name: Tamara Russell MRN: [id number] DOB:1961/01/31, 63 y.o., female Today's Date: 07/14/2024   PCP: Benjamine Aland, MD REFERRING PROVIDER: Silva Juliene SAUNDERS, DPM  END OF SESSION:  PT End of Session - 07/14/24 0839     Visit Number 4    Number of Visits 7    Date for Recertification  08/05/24   Due to delay in scheduling   Authorization Type [facility] Medicare    Authorization Time Period 6 visits 8/26-10/21/25    PT Start Time 0837    PT Stop Time 0920    PT Time Calculation (min) 43 min    Equipment Utilized During Treatment Other (comment);Gait belt   R AFO   Activity Tolerance Patient tolerated treatment well    Behavior During Therapy WFL for tasks assessed/performed            Past Medical History:  Diagnosis Date   Acid reflux    Chronic diastolic heart failure (HCC)    Chronic respiratory failure with hypoxia and hypercapnia (HCC)    on 2 L [place] continuous   Complication of anesthesia    slow to wake up   DVT (deep venous thrombosis) (HCC)    of LLE ; dx'ed on 03/05/22   History of COVID-19    HLD (hyperlipidemia)    Hypertension    Past Surgical History:  Procedure Laterality Date   ABDOMINAL HYSTERECTOMY     BIOPSY  11/17/2018   Procedure: BIOPSY;  Surgeon: Albertus Gordy HERO, MD;  Location: [facility];  Service: Gastroenterology;;   BREAST EXCISIONAL BIOPSY Left    benign   BREAST EXCISIONAL BIOPSY Right    benign   ESOPHAGOGASTRODUODENOSCOPY (EGD) WITH PROPOFOL  N/A 11/17/2018   Procedure: ESOPHAGOGASTRODUODENOSCOPY (EGD) WITH PROPOFOL ;  Surgeon: Albertus Gordy HERO, MD;  Location: [facility];  Service: Gastroenterology;  Laterality: N/A;   Patient Active Problem List   Diagnosis Date Noted   External hemorrhoids without complication 08/10/2022   Chronic venous insufficiency 07/04/2022   HLD (hyperlipidemia)    Acute encephalopathy 04/15/2022   DVT (deep venous thrombosis) (HCC) 03/05/2022   Ileus (HCC)  03/04/2022   OSA (obstructive sleep apnea) 03/04/2022   Encephalopathy    Acute metabolic encephalopathy 02/06/2022   Chronic iron deficiency anemia 02/06/2022   Acute on chronic diastolic CHF (congestive heart failure) (HCC) 02/06/2022   Bradycardia    Sinus pause    Hypokalemia    Hypomagnesemia    Chronic heart failure with preserved ejection fraction (HFpEF) (HCC)    Elevated troponin level not due to acute coronary syndrome    Obesity, Class III, BMI 40-49.9 (morbid obesity)    GI bleed 11/19/2018   Hypertension    Acute gastric ulcer with hemorrhage    PUD (peptic ulcer disease)    GERD (gastroesophageal reflux disease) 11/19/2015    ONSET DATE: 05/15/2024 (referral)   REFERRING DIAG: M21.371 (ICD-10-CM) - Foot drop, right foot R26.9 (ICD-10-CM) - Neurologic gait dysfunction  THERAPY DIAG:  Abnormal posture  Muscle weakness (generalized)  Other abnormalities of gait and mobility  Foot drop, right  Rationale for Evaluation and Treatment: Rehabilitation  SUBJECTIVE:  SUBJECTIVE STATEMENT: Pt presents w/RW and daughter, [name]. Wearing R AFO. States her L knee started bothering her over the weekend, is a bit better today. Thinks it is arthritis. Forgot to call about Silver  Sneakers, will work on that this week. No falls.   Pt accompanied by: Daughter, [name]   PERTINENT HISTORY: CHF, hypertension, hyperlipidemia, prior DVT PAIN:  Are you having pain? Yes: NPRS scale: 8/10 Pain location: Left knee  Pain description: achy  PRECAUTIONS: Fall  RED FLAGS: None   WEIGHT BEARING RESTRICTIONS: No  FALLS: Has patient fallen in last 6 months? No  LIVING ENVIRONMENT: Lives with: lives with their family and lives with their spouse Lives in: House/apartment Stairs: Yes: External: 2  steps; on right going up, on left going up, and can reach both Has following equipment at home: Quad cane small base, Walker - 2 wheeled, Grab bars, and AFO   PLOF: Requires assistive device for independence, Needs assistance with ADLs, Needs assistance with homemaking, and Leisure: loves to ride in the car, sit out on the front porch, watch TV   PATIENT GOALS: My foot! I want to walk! I dream about walking   OBJECTIVE:  Note: Objective measures were completed at Evaluation unless otherwise noted.  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    POSTURE: rounded shoulders, forward head, and increased thoracic kyphosis  LOWER EXTREMITY ROM:     Active  Right Eval Left Eval  Hip flexion    Hip extension    Hip abduction    Hip adduction    Hip internal rotation    Hip external rotation    Knee flexion    Knee extension    Ankle dorsiflexion    Ankle plantarflexion    Ankle inversion    Ankle eversion     (Blank rows = not tested)  LOWER EXTREMITY MMT:  Tested in seated position   MMT Right Eval Left Eval  Hip flexion 4 3+  Hip extension    Hip abduction 4+ 4+  Hip adduction 4+ 4+  Hip internal rotation    Hip external rotation    Knee flexion 5 5  Knee extension 5 5  Ankle dorsiflexion 1 4  Ankle plantarflexion    Ankle inversion    Ankle eversion    (Blank rows = not tested)  BED MOBILITY:  Not tested Pt reports independence with this   TRANSFERS: Sit to stand: Modified independence  Assistive device utilized: Environmental consultant - 2 wheeled     Stand to sit: Modified independence  Assistive device utilized: Environmental consultant - 2 wheeled     Braced hands on RW to push up to stand. Note everted ankle position of R ankle   RAMP:  Not tested  CURB:  Not tested  STAIRS: Not tested GAIT: Gait pattern: step through pattern,  decreased stride length, decreased hip/knee flexion- Right, decreased hip/knee flexion- Left, lateral hip instability, trunk flexed, wide BOS, and poor foot clearance- Right Distance walked: various clinic distances  Assistive device utilized: Walker - 2 wheeled Level of assistance: SBA Comments: Pt continues to demonstrate excessive forward flexed posture but is able to self-cue to facilitate hip extension intermittently. Min cues to stay close to RW, especially w/turns.    FUNCTIONAL TESTS:        VITALS  There were no vitals filed for this visit.  TREATMENT :    Ther Act  Per pt request, NuStep level 5 for 10 minutes using BUE/BLEs for neural priming for reciprocal movement, dynamic cardiovascular warmup and increased amplitude of stepping. Mod encouraging cues required for pt to work for 10 minutes rather than 8 and educated pt on importance of progressive overload. RPE of 10/10 following activity.  Seated march overs using 12# KB, x12 reps per side, for improved hip flexor/quad strength, core stability and upright posture. Mod verbal cues to maintain knee flexion throughout and to slow down to focus on quality of movement and eccentric control. Added to HEP (see bolded below) Seated deadlifts w/12# KB, 3x10 reps, for improved posterior chain strength and introduction to hip hinge and proper lift technique. Min cues to avoid valsalva and to use glutes to perform rather than pull w/arms. Pt allotted 90s rest break between sets and cued pt to count out loud to ensure she was maintaining her breathing.  While seated on green theraball for improved core stability, BUE strength and postural control: Seated alt chest presses w/3# dumbbells, 2x8 reps and 1x10 reps  Alt OH presses w/3#dumbbells, 1x8 reps and 2x10 reps. Pt unable to achieve full ROM but did improve  w/repetition.   PATIENT EDUCATION: Education details: Updates to HEP, continued encouragement to join [employer] for continued fitness post-DC Person educated: Patient and Child(ren) Education method: Explanation, Demonstration, Tactile cues, Verbal cues, and Handouts Education comprehension: verbalized understanding, returned demonstration, verbal cues required, tactile cues required, and needs further education  HOME EXERCISE PROGRAM: Access Code: [email or web address] URL: [email or web address] Date: 06/30/2024 Prepared by: Marlon Arlana Canizales  Exercises - Sit to stand with arms wide   - 1 x daily - 7 x weekly - 3 sets - 10 reps - Standing Hip Extension with Counter Support  - 1 x daily - 7 x weekly - 2-3 sets - 10 reps - Seated march over  - 1 x daily - 7 x weekly - 3 sets - 12-15 reps  GOALS: Goals reviewed with patient? Yes  SHORT TERM GOALS: Target date: 07/08/2024   Pt will be independent with initial HEP for improved strength, balance, transfers and gait.  Baseline: not established on eval  Goal status: MET  2.  Pt will trial various foot drop braces on RLE to determine best option for improved step clearance w/RLE, stability with gait and independence w/mobility  Baseline: pt has AFO that she bought online, is to bring it to first treatment session; pt has AFO Goal status: MET   3.  to be assessed and LTG updated  Baseline:  Goal status: MET  4.  TUG to be assessed and LTG updated Baseline:  Goal status: MET   LONG TERM GOALS: Target date: 08/05/2024   Pt will improve gait velocity to at least 2.5 ft/s w/LRAD and improved upright posture for improved gait efficiency and independence   Baseline: 2.13 ft/s w/RW and severe forward flexed posture  Goal status: INITIAL  2.  Pt will be independent and compliant with final HEP and walking program for improved strength, balance, transfers and gait.  Baseline:  Goal status: INITIAL  3.  Pt will ambulate greater  than or equal to 500 feet on with RW and R AFO for improved cardiovascular endurance and BLE strength.   Baseline: 27' w/RW and R AFO (9/22) Goal status: REVISED   4.  Pt will improve normal TUG to less than or equal to 15 seconds for improved functional mobility and decreased  fall risk.  Baseline: 20.81s w/RW and R AFO (9/22) Goal status: REVISED    ASSESSMENT:  CLINICAL IMPRESSION: Emphasis of skilled PT session on posterior chain strength, introduction to proper hip hinge, postural control and endurance. Pt required min-mod encouraging cues throughout session to sustain attention to task and facilitate progressive overload. Pt's daughter to look into gym membership at [facility] this week to encourage pt to continue fitness post-DC. Continue POC.   OBJECTIVE IMPAIRMENTS: Abnormal gait, decreased activity tolerance, decreased balance, decreased cognition, decreased endurance, decreased knowledge of condition, decreased knowledge of use of DME, decreased mobility, difficulty walking, decreased strength, decreased safety awareness, impaired sensation, improper body mechanics, and postural dysfunction  ACTIVITY LIMITATIONS: carrying, lifting, bending, standing, squatting, stairs, transfers, bathing, locomotion level, and caring for others  PARTICIPATION LIMITATIONS: meal prep, cleaning, laundry, interpersonal relationship, driving, shopping, community activity, and yard work  PERSONAL FACTORS: Behavior pattern, Fitness, Past/current experiences, Transportation, and 1-2 comorbidities: Peripheral neuropathy in RLE and R foot drop are also affecting patient's functional outcome.   REHAB POTENTIAL: Fair Due to severity of deficits   CLINICAL DECISION MAKING: Evolving/moderate complexity  EVALUATION COMPLEXITY: Moderate  PLAN:  PT FREQUENCY: 1x/week (pt only able to come 1x/week due to copay and transportation)   PT DURATION: 6 weeks (POC written for 8 weeks due to delay in scheduling)    PLANNED INTERVENTIONS: 02835- PT Re-evaluation, 97750- Physical Performance Testing, 97110-Therapeutic exercises, 97530- Therapeutic activity, [id number]- Neuromuscular re-education, 97535- Self Care, 02859- Manual therapy, [id number]- Gait training, [phone]- Orthotic Initial, [id number]- Orthotic/Prosthetic subsequent, [id number]- Aquatic Therapy, [phone]- Electrical stimulation (manual), [phone] (1-2 muscles), [id number] (3+ muscles)- Dry Needling, Patient/Family education, Balance training, Stair training, Joint mobilization, Spinal mobilization, Vestibular training, and DME instructions  PLAN FOR NEXT SESSION: Did they look into [employer]? Supine lumbar exercises. How is wearing brace? Add to HEP for improved endurance, upright posture, R ankle stability, toe taps to step, theraball exercises    Lailyn Appelbaum E Gennifer Potenza, PT, DPT 07/14/2024, 9:22 AM

## 2024-07-21 ENCOUNTER — Ambulatory Visit: Attending: Podiatry | Admitting: Physical Therapy

## 2024-07-21 DIAGNOSIS — M6281 Muscle weakness (generalized): Secondary | ICD-10-CM | POA: Diagnosis present

## 2024-07-21 DIAGNOSIS — R2689 Other abnormalities of gait and mobility: Secondary | ICD-10-CM | POA: Diagnosis present

## 2024-07-21 DIAGNOSIS — M21371 Foot drop, right foot: Secondary | ICD-10-CM | POA: Diagnosis present

## 2024-07-21 DIAGNOSIS — M25562 Pain in left knee: Secondary | ICD-10-CM | POA: Diagnosis present

## 2024-07-21 DIAGNOSIS — R293 Abnormal posture: Secondary | ICD-10-CM | POA: Diagnosis present

## 2024-07-21 DIAGNOSIS — G8929 Other chronic pain: Secondary | ICD-10-CM | POA: Insufficient documentation

## 2024-07-21 NOTE — Therapy (Signed)
 OUTPATIENT PHYSICAL THERAPY NEURO TREATMENT   Patient Name: Tamara Russell MRN: [id number] DOB:10-06-1961, 63 y.o., female Today's Date: 07/21/2024   PCP: Benjamine Aland, MD REFERRING PROVIDER: Silva Juliene SAUNDERS, DPM  END OF SESSION:  PT End of Session - 07/21/24 0849     Visit Number 5    Number of Visits 7    Date for Recertification  08/05/24   Due to delay in scheduling   Authorization Type [facility] Medicare    Authorization Time Period 6 visits 8/26-10/21/25    PT Start Time 0848    PT Stop Time 0928    PT Time Calculation (min) 40 min    Equipment Utilized During Treatment Other (comment);Gait belt   R AFO   Activity Tolerance Patient tolerated treatment well    Behavior During Therapy WFL for tasks assessed/performed            Past Medical History:  Diagnosis Date   Acid reflux    Chronic diastolic heart failure (HCC)    Chronic respiratory failure with hypoxia and hypercapnia (HCC)    on 2 L [place] continuous   Complication of anesthesia    slow to wake up   DVT (deep venous thrombosis) (HCC)    of LLE ; dx'ed on 03/05/22   History of COVID-19    HLD (hyperlipidemia)    Hypertension    Past Surgical History:  Procedure Laterality Date   ABDOMINAL HYSTERECTOMY     BIOPSY  11/17/2018   Procedure: BIOPSY;  Surgeon: Albertus Gordy HERO, MD;  Location: [facility] ENDOSCOPY;  Service: Gastroenterology;;   BREAST EXCISIONAL BIOPSY Left    benign   BREAST EXCISIONAL BIOPSY Right    benign   ESOPHAGOGASTRODUODENOSCOPY (EGD) WITH PROPOFOL  N/A 11/17/2018   Procedure: ESOPHAGOGASTRODUODENOSCOPY (EGD) WITH PROPOFOL ;  Surgeon: Albertus Gordy HERO, MD;  Location: [facility] ENDOSCOPY;  Service: Gastroenterology;  Laterality: N/A;   Patient Active Problem List   Diagnosis Date Noted   External hemorrhoids without complication 08/10/2022   Chronic venous insufficiency 07/04/2022   HLD (hyperlipidemia)    Acute encephalopathy 04/15/2022   DVT (deep venous thrombosis) (HCC) 03/05/2022   Ileus (HCC)  03/04/2022   OSA (obstructive sleep apnea) 03/04/2022   Encephalopathy    Acute metabolic encephalopathy 02/06/2022   Chronic iron deficiency anemia 02/06/2022   Acute on chronic diastolic CHF (congestive heart failure) (HCC) 02/06/2022   Bradycardia    Sinus pause    Hypokalemia    Hypomagnesemia    Chronic heart failure with preserved ejection fraction (HFpEF) (HCC)    Elevated troponin level not due to acute coronary syndrome    Obesity, Class III, BMI 40-49.9 (morbid obesity) (HCC)    GI bleed 11/19/2018   Hypertension    Acute gastric ulcer with hemorrhage    PUD (peptic ulcer disease)    GERD (gastroesophageal reflux disease) 11/19/2015    ONSET DATE: 05/15/2024 (referral)   REFERRING DIAG: M21.371 (ICD-10-CM) - Foot drop, right foot R26.9 (ICD-10-CM) - Neurologic gait dysfunction  THERAPY DIAG:  Abnormal posture  Muscle weakness (generalized)  Other abnormalities of gait and mobility  Foot drop, right  Chronic pain of left knee  Rationale for Evaluation and Treatment: Rehabilitation  SUBJECTIVE:  SUBJECTIVE STATEMENT: Pt presents w/RW and daughter, [name]. Wearing R AFO. States her L knee is still hurting her and she did not sleep well last night. Denies falls. Does not know if she called about joining the [facility] yet, will call today.   Pt accompanied by: Daughter, [name]   PERTINENT HISTORY: CHF, hypertension, hyperlipidemia, prior DVT PAIN:  Are you having pain? Yes: NPRS scale: 8/10 Pain location: Left knee  Pain description: achy  PRECAUTIONS: Fall  RED FLAGS: None   WEIGHT BEARING RESTRICTIONS: No  FALLS: Has patient fallen in last 6 months? No  LIVING ENVIRONMENT: Lives with: lives with their family and lives with their spouse Lives in: House/apartment Stairs:  Yes: External: 2 steps; on right going up, on left going up, and can reach both Has following equipment at home: Quad cane small base, Walker - 2 wheeled, Grab bars, and AFO   PLOF: Requires assistive device for independence, Needs assistance with ADLs, Needs assistance with homemaking, and Leisure: loves to ride in the car, sit out on the front porch, watch TV   PATIENT GOALS: My foot! I want to walk! I dream about walking   OBJECTIVE:  Note: Objective measures were completed at Evaluation unless otherwise noted.  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    POSTURE: rounded shoulders, forward head, and increased thoracic kyphosis  LOWER EXTREMITY ROM:     Active  Right Eval Left Eval  Hip flexion    Hip extension    Hip abduction    Hip adduction    Hip internal rotation    Hip external rotation    Knee flexion    Knee extension    Ankle dorsiflexion    Ankle plantarflexion    Ankle inversion    Ankle eversion     (Blank rows = not tested)  LOWER EXTREMITY MMT:  Tested in seated position   MMT Right Eval Left Eval  Hip flexion 4 3+  Hip extension    Hip abduction 4+ 4+  Hip adduction 4+ 4+  Hip internal rotation    Hip external rotation    Knee flexion 5 5  Knee extension 5 5  Ankle dorsiflexion 1 4  Ankle plantarflexion    Ankle inversion    Ankle eversion    (Blank rows = not tested)  BED MOBILITY:  Not tested Pt reports independence with this   TRANSFERS: Sit to stand: Modified independence  Assistive device utilized: Environmental consultant - 2 wheeled     Stand to sit: Modified independence  Assistive device utilized: Environmental consultant - 2 wheeled     Braced hands on RW to push up to stand. Note everted ankle position of R ankle   RAMP:  Not tested  CURB:  Not tested  STAIRS: Not tested GAIT: Gait pattern: step  through pattern, decreased stride length, decreased hip/knee flexion- Right, decreased hip/knee flexion- Left, lateral hip instability, trunk flexed, wide BOS, and poor foot clearance- Right Distance walked: various clinic distances  Assistive device utilized: Walker - 2 wheeled Level of assistance: SBA Comments: Pt continues to demonstrate excessive forward flexed posture but is able to self-cue to facilitate hip extension intermittently. Min cues to stay close to RW, especially w/turns.    FUNCTIONAL TESTS:        VITALS  There were no vitals filed for this visit.  TREATMENT :    Ther Act  Per pt request, NuStep level 5 for 11 minutes using BUE/BLEs for neural priming for reciprocal movement, dynamic cardiovascular warmup and increased amplitude of stepping. Min encouraging cues required for pt to work for 11 minutes rather than 10 and continue to educate pt on importance of progressive overload. RPE of 12/10 following activity.  5 Blaze pods on random reach setting for improved functional hip strength, step clearance, single leg stability and LE coordination.  Performed on 2 minute intervals with 3 minute seated rest periods.  Pt requires BUE support and distant SBA guarding. Round 1:  in // bars, placed pods in horizontal line w/cues to side step and tap.  28 hits. Round 2:  same setup.  26 hits. Round 3:  same setup. Pt attempting to get out of round by talking about [employer], requiring max cues to perform final round and sustain attention to task.  36 hits. Notable errors/deficits:  Max cues to maintain erect posture throughout as pt leaning heavily on forearms on bars. RPE of 10/10 at end of activity.   PATIENT EDUCATION: Education details: Continue HEP, continued encouragement to join [employer] Person educated: Patient and Child(ren) Education method:  Explanation and Verbal cues Education comprehension: verbalized understanding, verbal cues required, and needs further education  HOME EXERCISE PROGRAM: Access Code: [email or web address] URL: [email or web address] Date: 06/30/2024 Prepared by: Marlon Magdeline Prange  Exercises - Sit to stand with arms wide   - 1 x daily - 7 x weekly - 3 sets - 10 reps - Standing Hip Extension with Counter Support  - 1 x daily - 7 x weekly - 2-3 sets - 10 reps - Seated march over  - 1 x daily - 7 x weekly - 3 sets - 12-15 reps  GOALS: Goals reviewed with patient? Yes  SHORT TERM GOALS: Target date: 07/08/2024   Pt will be independent with initial HEP for improved strength, balance, transfers and gait.  Baseline: not established on eval  Goal status: MET  2.  Pt will trial various foot drop braces on RLE to determine best option for improved step clearance w/RLE, stability with gait and independence w/mobility  Baseline: pt has AFO that she bought online, is to bring it to first treatment session; pt has AFO Goal status: MET   3.  to be assessed and LTG updated  Baseline:  Goal status: MET  4.  TUG to be assessed and LTG updated Baseline:  Goal status: MET   LONG TERM GOALS: Target date: 08/05/2024   Pt will improve gait velocity to at least 2.5 ft/s w/LRAD and improved upright posture for improved gait efficiency and independence   Baseline: 2.13 ft/s w/RW and severe forward flexed posture  Goal status: INITIAL  2.  Pt will be independent and compliant with final HEP and walking program for improved strength, balance, transfers and gait.  Baseline:  Goal status: INITIAL  3.  Pt will ambulate greater than or equal to 500 feet on with RW and R AFO for improved cardiovascular endurance and BLE strength.   Baseline: 41' w/RW and R AFO (9/22) Goal status: REVISED   4.  Pt will improve normal TUG to less than or equal to 15 seconds for improved functional mobility and decreased  fall risk.  Baseline: 20.81s w/RW and R AFO (9/22) Goal status: REVISED    ASSESSMENT:  CLINICAL IMPRESSION: Emphasis of skilled PT session on reciprocal coordination, functional hip strength, single leg stability and cardiovascular  endurance. Continue to recommend pt join [employer] to continue fitness post-DC. Pt continues to require mod-max encouraging cues to perform entirety of task in PT due to fatigue and tendency to watch other pts in the gym. Pt fatigues very quickly, resulting in heavy reliance on BUE support on external structure to stand upright. Continue POC.   OBJECTIVE IMPAIRMENTS: Abnormal gait, decreased activity tolerance, decreased balance, decreased cognition, decreased endurance, decreased knowledge of condition, decreased knowledge of use of DME, decreased mobility, difficulty walking, decreased strength, decreased safety awareness, impaired sensation, improper body mechanics, and postural dysfunction  ACTIVITY LIMITATIONS: carrying, lifting, bending, standing, squatting, stairs, transfers, bathing, locomotion level, and caring for others  PARTICIPATION LIMITATIONS: meal prep, cleaning, laundry, interpersonal relationship, driving, shopping, community activity, and yard work  PERSONAL FACTORS: Behavior pattern, Fitness, Past/current experiences, Transportation, and 1-2 comorbidities: Peripheral neuropathy in RLE and R foot drop are also affecting patient's functional outcome.   REHAB POTENTIAL: Fair Due to severity of deficits   CLINICAL DECISION MAKING: Evolving/moderate complexity  EVALUATION COMPLEXITY: Moderate  PLAN:  PT FREQUENCY: 1x/week (pt only able to come 1x/week due to copay and transportation)   PT DURATION: 6 weeks (POC written for 8 weeks due to delay in scheduling)   PLANNED INTERVENTIONS: 02835- PT Re-evaluation, 97750- Physical Performance Testing, 97110-Therapeutic exercises, 97530- Therapeutic activity, [id number]- Neuromuscular re-education, 97535- Self  Care, 02859- Manual therapy, [id number]- Gait training, [phone]- Orthotic Initial, [id number]- Orthotic/Prosthetic subsequent, [id number]- Aquatic Therapy, [phone]- Electrical stimulation (manual), [phone] (1-2 muscles), [id number] (3+ muscles)- Dry Needling, Patient/Family education, Balance training, Stair training, Joint mobilization, Spinal mobilization, Vestibular training, and DME instructions  PLAN FOR NEXT SESSION: Did they look into [employer]? Supine lumbar exercises. How is wearing brace? Add to HEP for improved endurance, upright posture, R ankle stability, toe taps to step, theraball exercises    Shirleyann Montero E Daemien Fronczak, PT, DPT 07/21/2024, 9:28 AM

## 2024-07-28 ENCOUNTER — Ambulatory Visit: Admitting: Physical Therapy

## 2024-07-28 VITALS — BP 141/82 | HR 76 | Temp 97.6°F

## 2024-07-28 DIAGNOSIS — R293 Abnormal posture: Secondary | ICD-10-CM | POA: Diagnosis not present

## 2024-07-28 DIAGNOSIS — M21371 Foot drop, right foot: Secondary | ICD-10-CM

## 2024-07-28 DIAGNOSIS — R2689 Other abnormalities of gait and mobility: Secondary | ICD-10-CM

## 2024-07-28 DIAGNOSIS — M6281 Muscle weakness (generalized): Secondary | ICD-10-CM

## 2024-07-28 DIAGNOSIS — G8929 Other chronic pain: Secondary | ICD-10-CM

## 2024-07-28 NOTE — Therapy (Addendum)
 OUTPATIENT PHYSICAL THERAPY NEURO TREATMENT   Patient Name: Tamara Russell MRN: [id number] DOB:[birth year], 63 y.o., female Today's Date: 07/28/2024   PCP: Benjamine Aland, MD REFERRING PROVIDER: Silva Juliene SAUNDERS, DPM  END OF SESSION:  PT End of Session - 07/28/24 0841     Visit Number 6    Number of Visits 7    Date for Recertification  08/05/24   Due to delay in scheduling   Authorization Type [facility] Medicare    Authorization Time Period 6 visits 8/26-10/21/25    PT Start Time 0840    PT Stop Time 0922    PT Time Calculation (min) 42 min    Equipment Utilized During Treatment Other (comment);Gait belt   R AFO   Activity Tolerance Patient tolerated treatment well    Behavior During Therapy WFL for tasks assessed/performed            Past Medical History:  Diagnosis Date   Acid reflux    Chronic diastolic heart failure (HCC)    Chronic respiratory failure with hypoxia and hypercapnia (HCC)    on 2 L [place] continuous   Complication of anesthesia    slow to wake up   DVT (deep venous thrombosis) (HCC)    of LLE ; dx'ed on 03/05/22   History of COVID-19    HLD (hyperlipidemia)    Hypertension    Past Surgical History:  Procedure Laterality Date   ABDOMINAL HYSTERECTOMY     BIOPSY  11/17/2018   Procedure: BIOPSY;  Surgeon: Albertus Gordy HERO, MD;  Location: [facility];  Service: Gastroenterology;;   BREAST EXCISIONAL BIOPSY Left    benign   BREAST EXCISIONAL BIOPSY Right    benign   ESOPHAGOGASTRODUODENOSCOPY (EGD) WITH PROPOFOL  N/A 11/17/2018   Procedure: ESOPHAGOGASTRODUODENOSCOPY (EGD) WITH PROPOFOL ;  Surgeon: Albertus Gordy HERO, MD;  Location: [facility];  Service: Gastroenterology;  Laterality: N/A;   Patient Active Problem List   Diagnosis Date Noted   External hemorrhoids without complication 08/10/2022   Chronic venous insufficiency 07/04/2022   HLD (hyperlipidemia)    Acute encephalopathy 04/15/2022   DVT (deep venous thrombosis) (HCC) 03/05/2022   Ileus (HCC)  03/04/2022   OSA (obstructive sleep apnea) 03/04/2022   Encephalopathy    Acute metabolic encephalopathy 02/06/2022   Chronic iron deficiency anemia 02/06/2022   Acute on chronic diastolic CHF (congestive heart failure) (HCC) 02/06/2022   Bradycardia    Sinus pause    Hypokalemia    Hypomagnesemia    Chronic heart failure with preserved ejection fraction (HFpEF) (HCC)    Elevated troponin level not due to acute coronary syndrome    Obesity, Class III, BMI 40-49.9 (morbid obesity) (HCC)    GI bleed 11/19/2018   Hypertension    Acute gastric ulcer with hemorrhage    PUD (peptic ulcer disease)    GERD (gastroesophageal reflux disease) 11/19/2015    ONSET DATE: 05/15/2024 (referral)   REFERRING DIAG: M21.371 (ICD-10-CM) - Foot drop, right foot R26.9 (ICD-10-CM) - Neurologic gait dysfunction  THERAPY DIAG:  Abnormal posture  Muscle weakness (generalized)  Other abnormalities of gait and mobility  Foot drop, right  Chronic pain of left knee  Rationale for Evaluation and Treatment: Rehabilitation  SUBJECTIVE:  SUBJECTIVE STATEMENT: Pt presents w/RW and Husband, Christopher. Pt reporting increased L knee pain today, likely due to rain yesterday. No falls. Did join the [facility], plans on going this week.   Pt accompanied by: Wilburt Christopher    PERTINENT HISTORY: CHF, hypertension, hyperlipidemia, prior DVT PAIN:  Are you having pain? Yes: NPRS scale: 10/10 Pain location: Left knee  Pain description: achy  PRECAUTIONS: Fall  RED FLAGS: None   WEIGHT BEARING RESTRICTIONS: No  FALLS: Has patient fallen in last 6 months? No  LIVING ENVIRONMENT: Lives with: lives with their family and lives with their spouse Lives in: House/apartment Stairs: Yes: External: 2 steps; on right going up, on left  going up, and can reach both Has following equipment at home: Quad cane small base, Walker - 2 wheeled, Grab bars, and AFO   PLOF: Requires assistive device for independence, Needs assistance with ADLs, Needs assistance with homemaking, and Leisure: loves to ride in the car, sit out on the front porch, watch TV   PATIENT GOALS: My foot! I want to walk! I dream about walking   OBJECTIVE:  Note: Objective measures were completed at Evaluation unless otherwise noted.  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    POSTURE: rounded shoulders, forward head, and increased thoracic kyphosis  LOWER EXTREMITY ROM:     Active  Right Eval Left Eval  Hip flexion    Hip extension    Hip abduction    Hip adduction    Hip internal rotation    Hip external rotation    Knee flexion    Knee extension    Ankle dorsiflexion    Ankle plantarflexion    Ankle inversion    Ankle eversion     (Blank rows = not tested)  LOWER EXTREMITY MMT:  Tested in seated position   MMT Right Eval Left Eval  Hip flexion 4 3+  Hip extension    Hip abduction 4+ 4+  Hip adduction 4+ 4+  Hip internal rotation    Hip external rotation    Knee flexion 5 5  Knee extension 5 5  Ankle dorsiflexion 1 4  Ankle plantarflexion    Ankle inversion    Ankle eversion    (Blank rows = not tested)  BED MOBILITY:  Not tested Pt reports independence with this   TRANSFERS: Sit to stand: Modified independence  Assistive device utilized: Environmental consultant - 2 wheeled     Stand to sit: Modified independence  Assistive device utilized: Environmental consultant - 2 wheeled     Braced hands on RW to push up to stand. Note everted ankle position of R ankle   RAMP:  Not tested  CURB:  Not tested  STAIRS: Not tested GAIT: Gait pattern: step through pattern, decreased stride length, decreased  hip/knee flexion- Right, decreased hip/knee flexion- Left, lateral hip instability, trunk flexed, wide BOS, and poor foot clearance- Right Distance walked: various clinic distances  Assistive device utilized: Walker - 4 wheeled Level of assistance: SBA Comments: Pt w/severe forward flexed posture this date and poor brake management. Min cues throughout session for upright posture and proper AD management   FUNCTIONAL TESTS:        VITALS  Vitals:   07/28/24 0852  BP: (!) 141/82  Pulse: 76  Temp: 97.6 F (36.4 C)  TREATMENT :    Ther Act/Self-care Per pt request, NuStep level 6 for 11 minutes using BUE/BLEs for neural priming for reciprocal movement, dynamic cardiovascular warmup and increased amplitude of stepping. Educated pt's husband on which resistance to use at [facility] and why. 3 minutes into activity, pt reporting nausea. Provided cup of cold water  and pt reported feeling better for a minute, but then continued to report nausea. Assessed vitals (see above) and all WNL. Pt agreeable to continue w/session but required intermittent rest breaks due to nausea. Pt reports RPE of 12/10 following activity. 4 Blaze pods on random reach setting for improved core stability, postural control, LE coordination and functional BLE strength.  Performed on 3 minute intervals with 3 minute seated rest periods.  Pt requires CGA guarding. Round 1:  While seated on green theraball, placed 4 pods from 10-2 o'clock in front of pt.  76 hits. Round 2:  same setup.  60 hits. Notable errors/deficits:  2 single LOB episodes in which pt leaned too far anteriorly and pushed ball posteriorly, requiring min A to stabilize.  Discussed importance of going to [facility] this week and recommended either Tues/Thus or Wed/Fri this week to trial it and return next week w/questions. Pt and husband  verbalized understanding. Recommended mix of strength machines, seated cardio machines and pool therapy classes. Pt inquiring if sitting in the sauna counts, so informed her this is something she can do after her workout but is not considered a workout.  Recommended use of RW as this is more stable for pt and promotes more upright posture. Husband reports he will obtain second RW for pt to keep one at home and one in the car.     PATIENT EDUCATION: Education details: Continue HEP, plan to DC next session, metrics to aim for at [facility] when using seated bike, use of RW vs rollator  Person educated: Patient and Spouse Education method: Explanation, Demonstration, and Verbal cues Education comprehension: verbalized understanding, returned demonstration, verbal cues required, and needs further education  HOME EXERCISE PROGRAM: Access Code: [email or web address] URL: [email or web address] Date: 06/30/2024 Prepared by: Marlon Jeryl Wilbourn  Exercises - Sit to stand with arms wide   - 1 x daily - 7 x weekly - 3 sets - 10 reps - Standing Hip Extension with Counter Support  - 1 x daily - 7 x weekly - 2-3 sets - 10 reps - Seated march over  - 1 x daily - 7 x weekly - 3 sets - 12-15 reps  GOALS: Goals reviewed with patient? Yes  SHORT TERM GOALS: Target date: 07/08/2024   Pt will be independent with initial HEP for improved strength, balance, transfers and gait.  Baseline: not established on eval  Goal status: MET  2.  Pt will trial various foot drop braces on RLE to determine best option for improved step clearance w/RLE, stability with gait and independence w/mobility  Baseline: pt has AFO that she bought online, is to bring it to first treatment session; pt has AFO Goal status: MET   3.  to be assessed and LTG updated  Baseline:  Goal status: MET  4.  TUG to be assessed and LTG updated Baseline:  Goal status: MET   LONG TERM GOALS: Target date: 08/05/2024   Pt will improve gait  velocity to at least 2.5 ft/s w/LRAD and improved upright posture for improved gait efficiency and independence   Baseline: 2.13 ft/s w/RW and severe forward flexed posture  Goal status: INITIAL  2.  Pt  will be independent and compliant with final HEP and walking program for improved strength, balance, transfers and gait.  Baseline:  Goal status: INITIAL  3.  Pt will ambulate greater than or equal to 500 feet on with RW and R AFO for improved cardiovascular endurance and BLE strength.   Baseline: 1' w/RW and R AFO (9/22) Goal status: REVISED   4.  Pt will improve normal TUG to less than or equal to 15 seconds for improved functional mobility and decreased fall risk.  Baseline: 20.81s w/RW and R AFO (9/22) Goal status: REVISED    ASSESSMENT:  CLINICAL IMPRESSION: Emphasis of skilled PT session on functional BLE strength, postural control, core stability and LE coordination. Pt limited by nausea at beginning of session, but all vitals WNL and pt able to continue w/short seated rest breaks and water . Pt continues to require min cues for upright posture w/gait, worsened w/rollator so informed pt and husband to use RW instead as pt able to hold herself up better w/this. Pt did join [employer] and plans to go 2x this week and try out machines and water  aerobics class. Pt on track to DC next session to focus on continued fitness at [facility]. Continue POC.   OBJECTIVE IMPAIRMENTS: Abnormal gait, decreased activity tolerance, decreased balance, decreased cognition, decreased endurance, decreased knowledge of condition, decreased knowledge of use of DME, decreased mobility, difficulty walking, decreased strength, decreased safety awareness, impaired sensation, improper body mechanics, and postural dysfunction  ACTIVITY LIMITATIONS: carrying, lifting, bending, standing, squatting, stairs, transfers, bathing, locomotion level, and caring for others  PARTICIPATION LIMITATIONS: meal prep, cleaning,  laundry, interpersonal relationship, driving, shopping, community activity, and yard work  PERSONAL FACTORS: Behavior pattern, Fitness, Past/current experiences, Transportation, and 1-2 comorbidities: Peripheral neuropathy in RLE and R foot drop are also affecting patient's functional outcome.   REHAB POTENTIAL: Fair Due to severity of deficits   CLINICAL DECISION MAKING: Evolving/moderate complexity  EVALUATION COMPLEXITY: Moderate  PLAN:  PT FREQUENCY: 1x/week (pt only able to come 1x/week due to copay and transportation)   PT DURATION: 6 weeks (POC written for 8 weeks due to delay in scheduling)   PLANNED INTERVENTIONS: 02835- PT Re-evaluation, 97750- Physical Performance Testing, 97110-Therapeutic exercises, 97530- Therapeutic activity, [id number]- Neuromuscular re-education, 97535- Self Care, 02859- Manual therapy, [id number]- Gait training, [phone]- Orthotic Initial, [id number]- Orthotic/Prosthetic subsequent, [id number]- Aquatic Therapy, [phone]- Electrical stimulation (manual), [phone] (1-2 muscles), [id number] (3+ muscles)- Dry Needling, Patient/Family education, Balance training, Stair training, Joint mobilization, Spinal mobilization, Vestibular training, and DME instructions  PLAN FOR NEXT SESSION: How was [employer]? Goals and DC.    Marlon BRAVO Tiah Heckel, PT, DPT 07/28/2024, 9:22 AM

## 2024-08-04 ENCOUNTER — Ambulatory Visit: Admitting: Physical Therapy

## 2024-08-04 VITALS — BP 134/74 | HR 62

## 2024-08-04 DIAGNOSIS — R2689 Other abnormalities of gait and mobility: Secondary | ICD-10-CM

## 2024-08-04 DIAGNOSIS — M6281 Muscle weakness (generalized): Secondary | ICD-10-CM

## 2024-08-04 DIAGNOSIS — R293 Abnormal posture: Secondary | ICD-10-CM | POA: Diagnosis not present

## 2024-08-04 DIAGNOSIS — G8929 Other chronic pain: Secondary | ICD-10-CM

## 2024-08-04 NOTE — Therapy (Signed)
 OUTPATIENT PHYSICAL THERAPY NEURO TREATMENT - DISCHARGE SUMMARY   Patient Name: Tamara Russell MRN: [id number] DOB:08/21/1961, 63 y.o., female Today's Date: 08/04/2024   PCP: Benjamine Aland, MD REFERRING PROVIDER: Silva Juliene SAUNDERS, DPM  PHYSICAL THERAPY DISCHARGE SUMMARY  Visits from Start of Care: 7  Current functional level related to goals / functional outcomes: Pt requires RW at SBA level for all ADLS   Remaining deficits: Severe postural deficits, RLE weakness, L knee pain, global deconditioning    Education / Equipment: HEP encouragement to go to [facility] as pt does have [employer] membership   Patient agrees to discharge. Patient goals were partially met. Patient is being discharged due to lack of progress.   END OF SESSION:  PT End of Session - 08/04/24 0846     Visit Number 7    Number of Visits 7    Date for Recertification  08/05/24   Due to delay in scheduling   Authorization Type [facility] Medicare    Authorization Time Period 6 visits 8/26-10/21/25    PT Start Time 0844    PT Stop Time 0910   DC   PT Time Calculation (min) 26 min    Equipment Utilized During Treatment Other (comment);Gait belt   R AFO   Activity Tolerance Patient tolerated treatment well    Behavior During Therapy WFL for tasks assessed/performed             Past Medical History:  Diagnosis Date   Acid reflux    Chronic diastolic heart failure (HCC)    Chronic respiratory failure with hypoxia and hypercapnia (HCC)    on 2 L [place] continuous   Complication of anesthesia    slow to wake up   DVT (deep venous thrombosis) (HCC)    of LLE ; dx'ed on 03/05/22   History of COVID-19    HLD (hyperlipidemia)    Hypertension    Past Surgical History:  Procedure Laterality Date   ABDOMINAL HYSTERECTOMY     BIOPSY  11/17/2018   Procedure: BIOPSY;  Surgeon: Albertus Gordy HERO, MD;  Location: [facility];  Service: Gastroenterology;;   BREAST EXCISIONAL BIOPSY Left    benign   BREAST EXCISIONAL BIOPSY  Right    benign   ESOPHAGOGASTRODUODENOSCOPY (EGD) WITH PROPOFOL  N/A 11/17/2018   Procedure: ESOPHAGOGASTRODUODENOSCOPY (EGD) WITH PROPOFOL ;  Surgeon: Albertus Gordy HERO, MD;  Location: [facility];  Service: Gastroenterology;  Laterality: N/A;   Patient Active Problem List   Diagnosis Date Noted   External hemorrhoids without complication 08/10/2022   Chronic venous insufficiency 07/04/2022   HLD (hyperlipidemia)    Acute encephalopathy 04/15/2022   DVT (deep venous thrombosis) (HCC) 03/05/2022   Ileus (HCC) 03/04/2022   OSA (obstructive sleep apnea) 03/04/2022   Encephalopathy    Acute metabolic encephalopathy 02/06/2022   Chronic iron deficiency anemia 02/06/2022   Acute on chronic diastolic CHF (congestive heart failure) (HCC) 02/06/2022   Bradycardia    Sinus pause    Hypokalemia    Hypomagnesemia    Chronic heart failure with preserved ejection fraction (HFpEF) (HCC)    Elevated troponin level not due to acute coronary syndrome    Obesity, Class III, BMI 40-49.9 (morbid obesity) (HCC)    GI bleed 11/19/2018   Hypertension    Acute gastric ulcer with hemorrhage    PUD (peptic ulcer disease)    GERD (gastroesophageal reflux disease) 11/19/2015    ONSET DATE: 05/15/2024 (referral)   REFERRING DIAG: M21.371 (ICD-10-CM) - Foot drop, right foot R26.9 (ICD-10-CM) -  Neurologic gait dysfunction  THERAPY DIAG:  Abnormal posture  Muscle weakness (generalized)  Other abnormalities of gait and mobility  Chronic pain of left knee  Rationale for Evaluation and Treatment: Rehabilitation  SUBJECTIVE:                                                                                                                                                                                             SUBJECTIVE STATEMENT: Pt presents w/RW and Husband, Christopher and daughter. Did not go to [facility] last week, got too busy. Plans on going this week. No falls   Pt accompanied by: Wilburt Christopher     PERTINENT HISTORY: CHF, hypertension, hyperlipidemia, prior DVT PAIN:  Are you having pain? Yes: NPRS scale: 5/10 Pain location: Left knee  Pain description: achy  PRECAUTIONS: Fall  RED FLAGS: None   WEIGHT BEARING RESTRICTIONS: No  FALLS: Has patient fallen in last 6 months? No  LIVING ENVIRONMENT: Lives with: lives with their family and lives with their spouse Lives in: House/apartment Stairs: Yes: External: 2 steps; on right going up, on left going up, and can reach both Has following equipment at home: Quad cane small base, Walker - 2 wheeled, Grab bars, and AFO   PLOF: Requires assistive device for independence, Needs assistance with ADLs, Needs assistance with homemaking, and Leisure: loves to ride in the car, sit out on the front porch, watch TV   PATIENT GOALS: My foot! I want to walk! I dream about walking   OBJECTIVE:  Note: Objective measures were completed at Evaluation unless otherwise noted.  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    POSTURE: rounded shoulders, forward head, and increased thoracic kyphosis  LOWER EXTREMITY ROM:     Active  Right Eval Left Eval  Hip flexion    Hip extension    Hip abduction    Hip adduction    Hip internal rotation    Hip external rotation    Knee flexion    Knee extension    Ankle dorsiflexion    Ankle plantarflexion    Ankle inversion    Ankle eversion     (Blank rows = not tested)  LOWER EXTREMITY MMT:  Tested in seated position   MMT Right Eval Left Eval  Hip flexion 4 3+  Hip extension    Hip abduction 4+ 4+  Hip adduction 4+ 4+  Hip internal rotation    Hip external rotation    Knee flexion 5 5  Knee  extension 5 5  Ankle dorsiflexion 1 4  Ankle plantarflexion    Ankle inversion    Ankle eversion    (Blank rows = not tested)  BED  MOBILITY:  Not tested Pt reports independence with this   TRANSFERS: Sit to stand: Modified independence  Assistive device utilized: Environmental consultant - 2 wheeled     Stand to sit: Modified independence  Assistive device utilized: Environmental consultant - 2 wheeled     Braced hands on RW to push up to stand. Note everted ankle position of R ankle   RAMP:  Not tested  CURB:  Not tested  STAIRS: Not tested GAIT: Gait pattern: step through pattern, decreased stride length, decreased hip/knee flexion- Right, decreased hip/knee flexion- Left, lateral hip instability, trunk flexed, wide BOS, and poor foot clearance- Right Distance walked: various clinic distances  Assistive device utilized: Walker - 2 wheeled Level of assistance: SBA Comments: Pt w/severe forward flexed posture this date. Min cues throughout session for upright posture and proper AD management   FUNCTIONAL TESTS:   [facility] PT Assessment - 08/04/24 0855       Ambulation/Gait   Gait velocity 32.8' over 16.5s = 1.98 ft/s w/RW   SBA     Timed Up and Go Test   Normal TUG (seconds) 13.72   w/RW, significantly forward flexed posture            VITALS  Vitals:   08/04/24 0849  BP: 134/74  Pulse: 62                                                                                                                                 TREATMENT :    Self-care/home management  Assessed vitals in RUE at rest (see above) and WNL  Physical Performance   [facility] PT Assessment - 08/04/24 0855       Ambulation/Gait   Gait velocity 32.8' over 16.5s = 1.98 ft/s w/RW   SBA     Timed Up and Go Test   Normal TUG (seconds) 13.72   w/RW, significantly forward flexed posture          Gait pattern: step through pattern, decreased stride length, decreased hip/knee flexion- Right, decreased hip/knee flexion- Left, lateral hip instability, trunk flexed, and wide BOS Distance walked: 115' loop completed 3x = 345'  Assistive device utilized: Environmental consultant - 2  wheeled Level of assistance: SBA Comments: Pt only able to ambulate for 4 minutes prior to needing to stop, severe forward flexed posture this date that pt unable to correct due to fatigue.   Discussed results of goal assessment and informed pt of functional decline since starting PT. Pt strongly encouraged to go to [facility] 2x/week for her health and independence. Pt reports she will go this week and verbalized understanding of goal results and their interpretations.     PATIENT EDUCATION: Education details: Continue HEP, goal results, importance of going to [employer]  educated: Patient, Spouse, and Child(ren) Education method: Explanation, Demonstration, and Verbal cues Education comprehension: verbalized understanding, returned demonstration, verbal cues required, and needs further education  HOME EXERCISE PROGRAM: Access Code: [email or web address] URL: [email or web address] Date: 06/30/2024 Prepared by: Marlon Erling Arrazola  Exercises - Sit to stand with arms wide   - 1 x daily - 7 x weekly - 3 sets - 10 reps - Standing Hip Extension with Counter Support  - 1 x daily - 7 x weekly - 2-3 sets - 10 reps - Seated march over  - 1 x daily - 7 x weekly - 3 sets - 12-15 reps  GOALS: Goals reviewed with patient? Yes  SHORT TERM GOALS: Target date: 07/08/2024   Pt will be independent with initial HEP for improved strength, balance, transfers and gait.  Baseline: not established on eval  Goal status: MET  2.  Pt will trial various foot drop braces on RLE to determine best option for improved step clearance w/RLE, stability with gait and independence w/mobility  Baseline: pt has AFO that she bought online, is to bring it to first treatment session; pt has AFO Goal status: MET   3.  to be assessed and LTG updated  Baseline:  Goal status: MET  4.  TUG to be assessed and LTG updated Baseline:  Goal status: MET   LONG TERM GOALS: Target date: 08/05/2024   Pt will improve gait  velocity to at least 2.5 ft/s w/LRAD and improved upright posture for improved gait efficiency and independence   Baseline: 2.13 ft/s w/RW and severe forward flexed posture; 1.98 ft/s w/RW  Goal status: NOT MET   2.  Pt will be independent and compliant with final HEP and walking program for improved strength, balance, transfers and gait.  Baseline:  Goal status: NOT MET  3.  Pt will ambulate greater than or equal to 500 feet on with RW and R AFO for improved cardiovascular endurance and BLE strength.   Baseline: 11' w/RW and R AFO (9/22); 345' w/RW and R AFO (10/20) Goal status: NOT MET    4.  Pt will improve normal TUG to less than or equal to 15 seconds for improved functional mobility and decreased fall risk.  Baseline: 20.81s w/RW and R AFO (9/22); 13.72s w/RW and R AFO (10/20) Goal status: MET    ASSESSMENT:  CLINICAL IMPRESSION: Emphasis of skilled PT session on LTG assessment and DC from PT. Pt has met 1 of 4 LTGs, improving her time on TUG but demonstrating significant forward flexed posture and AD management during measure. Pt unable to ambulate the full 6 minutes during this date due to fatigue compared to ability to ambulate full 6 minutes on 9/22 this year. Pt's gait speed also declined compared to eval and she is narrowly above the 1.8 ft/s threshold. Discussed results of goals with pt and pt has not been consistent w/HEP and continues to ambulate w/severe forward flexed posture despite max education from therapist and family. Pt is being DC due to lack of compliance w/PT and lack of progress. Pt and family in agreement to DC this date. Continue to strongly recommend pt go to [facility] 2x/week with her family to improve cardiovascular fitness and improve independence.   OBJECTIVE IMPAIRMENTS: Abnormal gait, decreased activity tolerance, decreased balance, decreased cognition, decreased endurance, decreased knowledge of condition, decreased knowledge of use of DME,  decreased mobility, difficulty walking, decreased strength, decreased safety awareness, impaired sensation, improper body mechanics, and postural dysfunction  ACTIVITY LIMITATIONS:  carrying, lifting, bending, standing, squatting, stairs, transfers, bathing, locomotion level, and caring for others  PARTICIPATION LIMITATIONS: meal prep, cleaning, laundry, interpersonal relationship, driving, shopping, community activity, and yard work  PERSONAL FACTORS: Behavior pattern, Fitness, Past/current experiences, Transportation, and 1-2 comorbidities: Peripheral neuropathy in RLE and R foot drop are also affecting patient's functional outcome.   REHAB POTENTIAL: Fair Due to severity of deficits   CLINICAL DECISION MAKING: Evolving/moderate complexity  EVALUATION COMPLEXITY: Moderate  PLAN:  PT FREQUENCY: 1x/week (pt only able to come 1x/week due to copay and transportation)   PT DURATION: 6 weeks (POC written for 8 weeks due to delay in scheduling)   PLANNED INTERVENTIONS: 02835- PT Re-evaluation, 97750- Physical Performance Testing, 97110-Therapeutic exercises, 97530- Therapeutic activity, 97112- Neuromuscular re-education, 97535- Self Care, 02859- Manual therapy, [id number]- Gait training, [phone]- Orthotic Initial, [phone]- Orthotic/Prosthetic subsequent, [id number]- Aquatic Therapy, [phone]- Electrical stimulation (manual), [phone] (1-2 muscles), [phone] (3+ muscles)- Dry Needling, Patient/Family education, Balance training, Stair training, Joint mobilization, Spinal mobilization, Vestibular training, and DME instructions   Mikeal Winstanley E Ivana Nicastro, PT, DPT 08/04/2024, 9:13 AM

## 2024-08-05 ENCOUNTER — Ambulatory Visit: Admitting: Internal Medicine

## 2024-08-20 ENCOUNTER — Other Ambulatory Visit: Payer: Self-pay | Admitting: Family Medicine

## 2024-08-20 DIAGNOSIS — F17208 Nicotine dependence, unspecified, with other nicotine-induced disorders: Secondary | ICD-10-CM
